# Patient Record
Sex: Female | Born: 1950 | Race: White | Hispanic: No | Smoking: Former smoker
Health system: Southern US, Community
[De-identification: ages and names within clinical notes are randomized; demographics above are authoritative.]

## PROBLEM LIST (undated history)

## (undated) DIAGNOSIS — I10 Essential (primary) hypertension: Secondary | ICD-10-CM

## (undated) DIAGNOSIS — Z9289 Personal history of other medical treatment: Secondary | ICD-10-CM

## (undated) DIAGNOSIS — E039 Hypothyroidism, unspecified: Secondary | ICD-10-CM

## (undated) DIAGNOSIS — M1990 Unspecified osteoarthritis, unspecified site: Secondary | ICD-10-CM

## (undated) DIAGNOSIS — R011 Cardiac murmur, unspecified: Secondary | ICD-10-CM

## (undated) DIAGNOSIS — I509 Heart failure, unspecified: Secondary | ICD-10-CM

## (undated) HISTORY — PX: CHOLECYSTECTOMY: SHX55

## (undated) HISTORY — PX: DILATION AND CURETTAGE OF UTERUS: SHX78

---

## 1999-11-30 ENCOUNTER — Encounter: Admission: RE | Admit: 1999-11-30 | Discharge: 1999-11-30 | Payer: Self-pay | Admitting: Obstetrics and Gynecology

## 1999-11-30 ENCOUNTER — Encounter: Payer: Self-pay | Admitting: Obstetrics and Gynecology

## 2000-11-30 ENCOUNTER — Encounter: Admission: RE | Admit: 2000-11-30 | Discharge: 2000-11-30 | Payer: Self-pay | Admitting: Obstetrics and Gynecology

## 2000-11-30 ENCOUNTER — Encounter: Payer: Self-pay | Admitting: Obstetrics and Gynecology

## 2001-12-05 ENCOUNTER — Encounter: Payer: Self-pay | Admitting: Obstetrics and Gynecology

## 2001-12-05 ENCOUNTER — Ambulatory Visit (HOSPITAL_COMMUNITY): Admission: RE | Admit: 2001-12-05 | Discharge: 2001-12-05 | Payer: Self-pay | Admitting: Obstetrics and Gynecology

## 2002-05-05 ENCOUNTER — Encounter: Payer: Self-pay | Admitting: Family Medicine

## 2002-05-05 ENCOUNTER — Ambulatory Visit (HOSPITAL_COMMUNITY): Admission: RE | Admit: 2002-05-05 | Discharge: 2002-05-05 | Payer: Self-pay | Admitting: Family Medicine

## 2002-05-27 ENCOUNTER — Encounter (INDEPENDENT_AMBULATORY_CARE_PROVIDER_SITE_OTHER): Payer: Self-pay | Admitting: *Deleted

## 2002-05-27 ENCOUNTER — Ambulatory Visit (HOSPITAL_COMMUNITY): Admission: RE | Admit: 2002-05-27 | Discharge: 2002-05-27 | Payer: Self-pay

## 2003-01-17 ENCOUNTER — Encounter: Payer: Self-pay | Admitting: Obstetrics and Gynecology

## 2003-01-17 ENCOUNTER — Ambulatory Visit (HOSPITAL_COMMUNITY): Admission: RE | Admit: 2003-01-17 | Discharge: 2003-01-17 | Payer: Self-pay | Admitting: Obstetrics and Gynecology

## 2006-03-02 ENCOUNTER — Emergency Department (HOSPITAL_COMMUNITY): Admission: EM | Admit: 2006-03-02 | Discharge: 2006-03-02 | Payer: Self-pay | Admitting: Emergency Medicine

## 2009-02-03 ENCOUNTER — Encounter: Admission: RE | Admit: 2009-02-03 | Discharge: 2009-05-04 | Payer: Self-pay | Admitting: Rheumatology

## 2009-02-07 ENCOUNTER — Emergency Department (HOSPITAL_COMMUNITY): Admission: EM | Admit: 2009-02-07 | Discharge: 2009-02-07 | Payer: Self-pay | Admitting: Family Medicine

## 2009-10-09 ENCOUNTER — Ambulatory Visit (HOSPITAL_COMMUNITY): Admission: RE | Admit: 2009-10-09 | Discharge: 2009-10-09 | Payer: Self-pay | Admitting: Obstetrics and Gynecology

## 2010-05-20 ENCOUNTER — Ambulatory Visit: Payer: Self-pay | Admitting: Oncology

## 2010-06-02 LAB — CBC WITH DIFFERENTIAL/PLATELET
BASO%: 1.2 % (ref 0.0–2.0)
Basophils Absolute: 0.1 10*3/uL (ref 0.0–0.1)
EOS%: 3.8 % (ref 0.0–7.0)
Eosinophils Absolute: 0.4 10*3/uL (ref 0.0–0.5)
HCT: 40.1 % (ref 34.8–46.6)
HGB: 13.9 g/dL (ref 11.6–15.9)
LYMPH%: 21.7 % (ref 14.0–49.7)
MCH: 29.4 pg (ref 25.1–34.0)
MCHC: 34.5 g/dL (ref 31.5–36.0)
MCV: 85.2 fL (ref 79.5–101.0)
MONO#: 0.7 10*3/uL (ref 0.1–0.9)
MONO%: 6.2 % (ref 0.0–14.0)
NEUT#: 7.4 10*3/uL — ABNORMAL HIGH (ref 1.5–6.5)
NEUT%: 67.1 % (ref 38.4–76.8)
Platelets: 268 10*3/uL (ref 145–400)
RBC: 4.71 10*6/uL (ref 3.70–5.45)
RDW: 14.7 % — ABNORMAL HIGH (ref 11.2–14.5)
WBC: 11.1 10*3/uL — ABNORMAL HIGH (ref 3.9–10.3)
lymph#: 2.4 10*3/uL (ref 0.9–3.3)

## 2010-06-02 LAB — COMPREHENSIVE METABOLIC PANEL
ALT: 19 U/L (ref 0–35)
AST: 24 U/L (ref 0–37)
Albumin: 3.6 g/dL (ref 3.5–5.2)
Alkaline Phosphatase: 94 U/L (ref 39–117)
BUN: 14 mg/dL (ref 6–23)
CO2: 31 mEq/L (ref 19–32)
Calcium: 9.3 mg/dL (ref 8.4–10.5)
Chloride: 100 mEq/L (ref 96–112)
Creatinine, Ser: 0.97 mg/dL (ref 0.40–1.20)
Glucose, Bld: 94 mg/dL (ref 70–99)
Potassium: 3.7 mEq/L (ref 3.5–5.3)
Sodium: 140 mEq/L (ref 135–145)
Total Bilirubin: 0.8 mg/dL (ref 0.3–1.2)
Total Protein: 7.9 g/dL (ref 6.0–8.3)

## 2010-06-02 LAB — PROTIME-INR
INR: 1.1 — ABNORMAL LOW (ref 2.00–3.50)
Protime: 13.2 Seconds (ref 10.6–13.4)

## 2010-06-08 LAB — APTT: aPTT: 40 seconds — ABNORMAL HIGH (ref 24–37)

## 2010-06-08 LAB — VON WILLEBRAND FACTOR MULTIMER
Factor-VIII Activity: 197 % — ABNORMAL HIGH (ref 50–180)
Von Willebrand Factor Ag: 304 % — ABNORMAL HIGH (ref 50–217)

## 2010-06-10 LAB — MIXING STUDY DILUTIONS, PTT
Patient 1/1 Immediate Mix: 36 seconds
Patient 4/1 Immediate Mix: 44 seconds

## 2010-06-10 LAB — BETA-2 GLYCOPROTEIN ANTIBODIES: Beta-2 Glyco I IgG: 0 G Units (ref <20)

## 2010-06-10 LAB — LUPUS ANTICOAGULANT PANEL
DRVVT: 44.4 secs — ABNORMAL HIGH (ref 36.2–44.3)
Lupus Anticoagulant: NOT DETECTED

## 2010-06-10 LAB — PTT FACTOR INHIBITOR (MIXING STUDY): PTT: 52 seconds — ABNORMAL HIGH (ref 24–37)

## 2010-06-14 LAB — FACTOR 12 ASSAY: Factor XII Activity: 91 % (ref 50–150)

## 2010-06-21 ENCOUNTER — Ambulatory Visit: Payer: Self-pay | Admitting: Oncology

## 2010-11-11 LAB — CBC
Hemoglobin: 13.7 g/dL (ref 12.0–15.0)
MCV: 85.6 fL (ref 78.0–100.0)
RBC: 4.83 MIL/uL (ref 3.87–5.11)
WBC: 9.9 10*3/uL (ref 4.0–10.5)

## 2010-11-11 LAB — BASIC METABOLIC PANEL
BUN: 16 mg/dL (ref 6–23)
Chloride: 102 mEq/L (ref 96–112)
Creatinine, Ser: 1 mg/dL (ref 0.4–1.2)

## 2011-01-07 NOTE — Op Note (Signed)
NAME:  [name], [name]                      ACCOUNT NO.:  [id number]   MEDICAL RECORD NO.:  [id number]                   PATIENT TYPE:   LOCATION:                                       FACILITY:  [facility]   PHYSICIAN:  Skeet Simmer., M.D.         DATE OF BIRTH:  Dec 03, 1950   DATE OF PROCEDURE:  05/27/2002  DATE OF DISCHARGE:                                 OPERATIVE REPORT   PREOPERATIVE DIAGNOSES:  Symptomatic gallstones.   POSTOPERATIVE DIAGNOSES:  Symptomatic gallstones.   OPERATION PERFORMED:  Laparoscopic cholecystectomy.   SURGEON:  Zigmund Daniel, M.D.   ANESTHESIA:  General.   ASSISTANT:  Lorne Skeens. Hoxworth, M.D.   DESCRIPTION OF PROCEDURE:  Following adequate general anesthesia and  monitoring and routine preparation and draping of the abdomen I liberally  infused local anesthetic just below the umbilicus and then made a transverse  incision and dissected down to the fascia.  After cutting the fascia in the  midline and entering the peritoneum bluntly, I placed a 0 Vicryl pursestring  suture in the fascia, secured with a Hasson cannula and inflated the abdomen  with CO2.  I put in the laparoscope and saw no abnormalities except for  slightly distended gallbladder.  I then anesthetized three additional port  sites, one in the upper midline and two in the right upper quadrant and  placed three additional ports, then positioned the patient head up, foot  down and tilted to the left.  I grasped the gallbladder at the fundus and  elevated that toward the right shoulder and pulled the infundibulum  laterally and had good view of the structures of the hepatoduodenal  ligament.  I incised the peritoneum and dissected out the cystic duct and  the cystic artery, noting the cystic duct emergent from the infundibulum of  the gallbladder and noting the common bile duct separately.  I put four  clips across the cystic duct and cut between the two closest to the  gallbladder.  I put three clips on the cystic artery and cut between the two  closest to the gallbladder.  I then used a spatula and hook cautery  instruments to dissect the gallbladder from the gallbladder fossa and get  hemostasis in the gallbladder bed.  Hemostasis was not a problem.  I made  one small hole in the gallbladder while dissecting, so after detaching the  gallbladder from the liver, I placed it in a plastic pouch and then removed  it through the umbilical incision and tied the pursestring suture.  I  copiously irrigated the right upper quadrant to dilute out the bile which  had been spilled and then removed that irrigant.  I found that hemostasis  was excellent.  Sponge, needle and instrument counts were correct.  I  withdrew the lateral ports under direct vision, then allowed the CO2 to  escape and removed the epigastric port.  I closed all skin incisions  with  intracuticular 4-0 Vicryl and Steri-Strips.  The patient was stable  throughout the procedure.                                                 Skeet Simmer., M.D.    Elvis Coil  D:  05/27/2002  T:  05/27/2002  Job:  [id number]

## 2014-03-13 DIAGNOSIS — I509 Heart failure, unspecified: Secondary | ICD-10-CM | POA: Diagnosis not present

## 2014-03-13 DIAGNOSIS — K219 Gastro-esophageal reflux disease without esophagitis: Secondary | ICD-10-CM | POA: Diagnosis not present

## 2014-03-13 DIAGNOSIS — E039 Hypothyroidism, unspecified: Secondary | ICD-10-CM | POA: Diagnosis not present

## 2014-03-13 DIAGNOSIS — I10 Essential (primary) hypertension: Secondary | ICD-10-CM | POA: Diagnosis not present

## 2014-03-20 DIAGNOSIS — I10 Essential (primary) hypertension: Secondary | ICD-10-CM | POA: Diagnosis not present

## 2014-03-20 DIAGNOSIS — E559 Vitamin D deficiency, unspecified: Secondary | ICD-10-CM | POA: Diagnosis not present

## 2014-03-20 DIAGNOSIS — E039 Hypothyroidism, unspecified: Secondary | ICD-10-CM | POA: Diagnosis not present

## 2014-07-30 DIAGNOSIS — I5032 Chronic diastolic (congestive) heart failure: Secondary | ICD-10-CM | POA: Diagnosis not present

## 2014-07-30 DIAGNOSIS — Z23 Encounter for immunization: Secondary | ICD-10-CM | POA: Diagnosis not present

## 2014-09-09 DIAGNOSIS — E559 Vitamin D deficiency, unspecified: Secondary | ICD-10-CM | POA: Diagnosis not present

## 2014-09-09 DIAGNOSIS — I10 Essential (primary) hypertension: Secondary | ICD-10-CM | POA: Diagnosis not present

## 2014-09-24 DIAGNOSIS — I10 Essential (primary) hypertension: Secondary | ICD-10-CM | POA: Diagnosis not present

## 2014-09-24 DIAGNOSIS — K219 Gastro-esophageal reflux disease without esophagitis: Secondary | ICD-10-CM | POA: Diagnosis not present

## 2014-09-24 DIAGNOSIS — E039 Hypothyroidism, unspecified: Secondary | ICD-10-CM | POA: Diagnosis not present

## 2014-09-24 DIAGNOSIS — I5032 Chronic diastolic (congestive) heart failure: Secondary | ICD-10-CM | POA: Diagnosis not present

## 2015-03-08 ENCOUNTER — Emergency Department
Admission: EM | Admit: 2015-03-08 | Discharge: 2015-03-08 | Disposition: A | Discharge status: Home / Self Care | Payer: Medicare Other | Attending: Emergency Medicine | Admitting: Emergency Medicine

## 2015-03-08 ENCOUNTER — Encounter: Payer: Self-pay | Admitting: Emergency Medicine

## 2015-03-08 DIAGNOSIS — Z87891 Personal history of nicotine dependence: Secondary | ICD-10-CM | POA: Diagnosis not present

## 2015-03-08 DIAGNOSIS — S80862A Insect bite (nonvenomous), left lower leg, initial encounter: Secondary | ICD-10-CM | POA: Insufficient documentation

## 2015-03-08 DIAGNOSIS — Y9389 Activity, other specified: Secondary | ICD-10-CM | POA: Diagnosis not present

## 2015-03-08 DIAGNOSIS — S81852A Open bite, left lower leg, initial encounter: Secondary | ICD-10-CM | POA: Diagnosis not present

## 2015-03-08 DIAGNOSIS — W57XXXA Bitten or stung by nonvenomous insect and other nonvenomous arthropods, initial encounter: Secondary | ICD-10-CM | POA: Diagnosis not present

## 2015-03-08 DIAGNOSIS — Y998 Other external cause status: Secondary | ICD-10-CM | POA: Insufficient documentation

## 2015-03-08 DIAGNOSIS — Y92009 Unspecified place in unspecified non-institutional (private) residence as the place of occurrence of the external cause: Secondary | ICD-10-CM | POA: Diagnosis not present

## 2015-03-08 DIAGNOSIS — S8992XA Unspecified injury of left lower leg, initial encounter: Secondary | ICD-10-CM | POA: Diagnosis present

## 2015-03-08 DIAGNOSIS — T63301A Toxic effect of unspecified spider venom, accidental (unintentional), initial encounter: Secondary | ICD-10-CM

## 2015-03-08 DIAGNOSIS — I10 Essential (primary) hypertension: Secondary | ICD-10-CM | POA: Insufficient documentation

## 2015-03-08 HISTORY — DX: Heart failure, unspecified: I50.9

## 2015-03-08 HISTORY — DX: Unspecified osteoarthritis, unspecified site: M19.90

## 2015-03-08 HISTORY — DX: Essential (primary) hypertension: I10

## 2015-03-08 MED ORDER — RANITIDINE HCL 150 MG PO TABS: 150.0000 mg | ORAL_TABLET | Freq: Two times a day (BID) | ORAL | Status: DC

## 2015-03-08 MED ORDER — HYDROXYZINE PAMOATE 25 MG PO CAPS: 25.0000 mg | ORAL_CAPSULE | Freq: Three times a day (TID) | ORAL | Status: DC | PRN

## 2015-03-08 MED ORDER — IBUPROFEN 800 MG PO TABS: 800.0000 mg | ORAL_TABLET | Freq: Three times a day (TID) | ORAL | Status: DC | PRN

## 2015-03-08 MED ORDER — SULFAMETHOXAZOLE-TRIMETHOPRIM 800-160 MG PO TABS: 1.0000 | ORAL_TABLET | Freq: Two times a day (BID) | ORAL | Status: DC

## 2015-03-08 MED ORDER — HYDROCODONE-ACETAMINOPHEN 5-325 MG PO TABS: 1.0000 | ORAL_TABLET | ORAL | Status: DC | PRN

## 2015-03-08 MED ORDER — OXYCODONE-ACETAMINOPHEN 5-325 MG PO TABS: 1.0000 | ORAL_TABLET | ORAL | Status: DC | PRN

## 2015-03-08 NOTE — ED Notes (Signed)
Pt presents to the ER from home with complaints of left lower extremity pain since 02:30 this morning. Pt reports she think she has a spider bite to left lower extremity. Redness noted to area, warm at touch, and tender. redness is located around left ankle.

## 2015-03-08 NOTE — Discharge Instructions (Signed)
Spider Bite Spider bites are not common. Most spider bites do not cause serious problems. The elderly, very young children, and people with certain existing medical conditions are more likely to experience significant symptoms. SYMPTOMS  Spider bites may not cause any pain at first. Within 1 or 2 days of the bite, there may be swelling, redness, and pain in the bite area. However, some spider bites can cause pain within the first hour. TREATMENT  Your caregiver may prescribe antibiotic medicine if a bacterial infection develops in the bite. However, not all spider bites require antibiotics or prescription medicines.  HOME CARE INSTRUCTIONS  Do not scratch the bite area.  Keep the bite area clean and dry. Wash the area with soap and water as directed.  Put ice or cool compresses on the bite area.  Put ice in a plastic bag.  Place a towel between your skin and the bag.  Leave the ice on for 20 minutes, 4 times a day for the first 2 to 3 days, or as directed.  Keep the bite area elevated above the level of your heart. This helps reduce redness and swelling.  Only take over-the-counter or prescription medicines as directed by your caregiver.  If you are given antibiotics, take them as directed. Finish them even if you start to feel better. You may need a tetanus shot if:  You cannot remember when you had your last tetanus shot.  You have never had a tetanus shot.  The injury broke your skin. If you get a tetanus shot, your arm may swell, get red, and feel warm to the touch. This is common and not a problem. If you need a tetanus shot and you choose not to have one, there is a rare chance of getting tetanus. Sickness from tetanus can be serious. SEEK MEDICAL CARE IF: Your bite is not better after 3 days of treatment. SEEK IMMEDIATE MEDICAL CARE IF:  Your bite turns purple or develops increased swelling, pain, or redness.  You develop shortness of breath or chest pain.  You have  muscle cramps or painful muscle spasms.  You develop abdominal pain, nausea, or vomiting.  You feel unusually tired or sleepy. MAKE SURE YOU:  Understand these instructions.  Will watch your condition.  Will get help right away if you are not doing well or get worse. Document Released: 09/15/2004 Document Revised: 10/31/2011 Document Reviewed: 03/09/2011 Exit[employer] Patient Information 2015 [employer]. This information is not intended to replace advice given to you by your health care provider. Make sure you discuss any questions you have with your health care provider.  

## 2015-03-08 NOTE — ED Notes (Signed)
NAD noted at time of D/C. Pt taken to the lobby via wheelchair by her SO.

## 2015-03-08 NOTE — ED Provider Notes (Signed)
[facility] Emergency Department Provider Note  ____________________________________________  Time seen: Approximately 10:33 AM  I have reviewed the triage vital signs and the nursing notes.   HISTORY  Chief Complaint Leg Pain   HPI [name] is a 64 y.o. female since emergency room with complaints of left lower extremity pain since about 2:30 this morning. Patient states that she's got a spider bite to the left leg. He is complaining of increased redness and tenderness with warm to touch.   Past Medical History  Diagnosis Date  . CHF (congestive heart failure)   . Arthritis   . Hypertension     There are no active problems to display for this patient.   Past Surgical History  Procedure Laterality Date  . Cholecystectomy      Current Outpatient Rx  Name  Route  Sig  Dispense  Refill  . ibuprofen (ADVIL,MOTRIN) 800 MG tablet   Oral   Take 1 tablet (800 mg total) by mouth every 8 (eight) hours as needed.   30 tablet   0   . oxyCODONE-acetaminophen (ROXICET) 5-325 MG per tablet   Oral   Take 1-2 tablets by mouth every 4 (four) hours as needed for severe pain.   15 tablet   0   . sulfamethoxazole-trimethoprim (BACTRIM DS,SEPTRA DS) 800-160 MG per tablet   Oral   Take 1 tablet by mouth 2 (two) times daily.   20 tablet   0     Allergies Review of patient's allergies indicates not on file.  No family history on file.  Social History History  Substance Use Topics  . Smoking status: Former Games developer  . Smokeless tobacco: Not on file  . Alcohol Use: No    Review of Systems Constitutional: No fever/chills Eyes: No visual changes. ENT: No sore throat. Cardiovascular: Denies chest pain. Respiratory: Denies shortness of breath. Gastrointestinal: No abdominal pain.  No nausea, no vomiting.  No diarrhea.  No constipation. Genitourinary: Negative for dysuria. Musculoskeletal: Negative for back pain. Skin: Negative for rash.  Positive for redness and point tenderness left lower leg. Neurological: Negative for headaches, focal weakness or numbness.  10-point ROS otherwise negative.  ____________________________________________   PHYSICAL EXAM:  VITAL SIGNS: ED Triage Vitals  Enc Vitals Group     BP 03/08/15 1032 147/74 mmHg     Pulse Rate 03/08/15 1032 78     Resp 03/08/15 1032 20     Temp 03/08/15 1032 100 F (37.8 C)     Temp Source 03/08/15 1032 Oral     SpO2 03/08/15 1032 98 %     Weight 03/08/15 1032 264 lb (119.75 kg)     Height 03/08/15 1032  (1.702 m)     Head Cir --      Peak Flow --      Pain Score 03/08/15 1033 7     Pain Loc --      Pain Edu? --      Excl. in GC? --     Constitutional: Alert and oriented. Well appearing and in no acute distress. Eyes: Conjunctivae are normal. PERRL. EOMI. Head: Atraumatic. Nose: No congestion/rhinnorhea. Mouth/Throat: Mucous membranes are moist.  Oropharynx non-erythematous. Neck: No stridor.   Cardiovascular: Normal rate, regular rhythm. Grossly normal heart sounds.  Good peripheral circulation. Respiratory: Normal respiratory effort.  No retractions. Lungs CTAB. Musculoskeletal: No lower extremity tenderness nor edema.  No joint effusions. Neurologic:  Normal speech and language. No gross focal neurologic deficits are  appreciated. No gait instability. Skin:  Skin is warm, dry and intact. Positive edema with obvious punctate wound. Warm to palpation. Increased erythema compared to the right side consistent with cellulitis. Psychiatric: Mood and affect are normal. Speech and behavior are normal.  ____________________________________________   LABS (all labs ordered are listed, but only abnormal results are displayed)  Labs Reviewed - No data to display ____________________________________________    RADIOLOGY  Not applicable ____________________________________________   PROCEDURES  Procedure(s) performed: None  Critical Care  performed: No  ____________________________________________   INITIAL IMPRESSION / ASSESSMENT AND PLAN / ED COURSE  Pertinent labs & imaging results that were available during my care of the patient were reviewed by me and considered in my medical decision making (see chart for details). Status post insect bite. Questionable spider. Given for Bactrim DS twice a day #20, Percocet 5/325, and ibuprofen 800. Patient voices no other emergency medical complaints at this time and will return to the ER with any worsening symptomology. ____________________________________________   FINAL CLINICAL IMPRESSION(S) / ED DIAGNOSES  Final diagnoses:  Spider bite, accidental or unintentional, initial encounter      Evangeline DakinCharles M Uel Davidow, PA-C 03/08/15 1105  Darci Currentandolph N Brown, MD 03/10/15 [id number]

## 2015-03-25 DIAGNOSIS — Z0000 Encounter for general adult medical examination without abnormal findings: Secondary | ICD-10-CM | POA: Diagnosis not present

## 2015-03-25 DIAGNOSIS — I129 Hypertensive chronic kidney disease with stage 1 through stage 4 chronic kidney disease, or unspecified chronic kidney disease: Secondary | ICD-10-CM | POA: Diagnosis not present

## 2015-03-25 DIAGNOSIS — E039 Hypothyroidism, unspecified: Secondary | ICD-10-CM | POA: Diagnosis not present

## 2015-03-25 DIAGNOSIS — I10 Essential (primary) hypertension: Secondary | ICD-10-CM | POA: Diagnosis not present

## 2015-03-25 DIAGNOSIS — E559 Vitamin D deficiency, unspecified: Secondary | ICD-10-CM | POA: Diagnosis not present

## 2015-03-25 DIAGNOSIS — F17211 Nicotine dependence, cigarettes, in remission: Secondary | ICD-10-CM | POA: Diagnosis not present

## 2015-04-01 DIAGNOSIS — F17211 Nicotine dependence, cigarettes, in remission: Secondary | ICD-10-CM | POA: Diagnosis not present

## 2015-04-01 DIAGNOSIS — I5032 Chronic diastolic (congestive) heart failure: Secondary | ICD-10-CM | POA: Diagnosis not present

## 2015-04-01 DIAGNOSIS — F3340 Major depressive disorder, recurrent, in remission, unspecified: Secondary | ICD-10-CM | POA: Diagnosis not present

## 2015-04-01 DIAGNOSIS — M810 Age-related osteoporosis without current pathological fracture: Secondary | ICD-10-CM | POA: Diagnosis not present

## 2015-04-01 DIAGNOSIS — I129 Hypertensive chronic kidney disease with stage 1 through stage 4 chronic kidney disease, or unspecified chronic kidney disease: Secondary | ICD-10-CM | POA: Diagnosis not present

## 2015-04-05 DIAGNOSIS — Z1211 Encounter for screening for malignant neoplasm of colon: Secondary | ICD-10-CM | POA: Diagnosis not present

## 2015-04-05 DIAGNOSIS — Z1212 Encounter for screening for malignant neoplasm of rectum: Secondary | ICD-10-CM | POA: Diagnosis not present

## 2015-04-22 DIAGNOSIS — M170 Bilateral primary osteoarthritis of knee: Secondary | ICD-10-CM | POA: Diagnosis not present

## 2015-06-19 DIAGNOSIS — M170 Bilateral primary osteoarthritis of knee: Secondary | ICD-10-CM | POA: Diagnosis not present

## 2015-08-04 DIAGNOSIS — M170 Bilateral primary osteoarthritis of knee: Secondary | ICD-10-CM | POA: Diagnosis not present

## 2015-09-24 DIAGNOSIS — F17211 Nicotine dependence, cigarettes, in remission: Secondary | ICD-10-CM | POA: Diagnosis not present

## 2015-09-24 DIAGNOSIS — E559 Vitamin D deficiency, unspecified: Secondary | ICD-10-CM | POA: Diagnosis not present

## 2015-09-24 DIAGNOSIS — I5032 Chronic diastolic (congestive) heart failure: Secondary | ICD-10-CM | POA: Diagnosis not present

## 2015-09-24 DIAGNOSIS — M8580 Other specified disorders of bone density and structure, unspecified site: Secondary | ICD-10-CM | POA: Diagnosis not present

## 2015-09-24 DIAGNOSIS — E039 Hypothyroidism, unspecified: Secondary | ICD-10-CM | POA: Diagnosis not present

## 2015-10-01 DIAGNOSIS — F3340 Major depressive disorder, recurrent, in remission, unspecified: Secondary | ICD-10-CM | POA: Diagnosis not present

## 2015-10-01 DIAGNOSIS — I129 Hypertensive chronic kidney disease with stage 1 through stage 4 chronic kidney disease, or unspecified chronic kidney disease: Secondary | ICD-10-CM | POA: Diagnosis not present

## 2015-10-01 DIAGNOSIS — N182 Chronic kidney disease, stage 2 (mild): Secondary | ICD-10-CM | POA: Diagnosis not present

## 2015-10-01 DIAGNOSIS — I509 Heart failure, unspecified: Secondary | ICD-10-CM | POA: Diagnosis not present

## 2015-10-05 ENCOUNTER — Other Ambulatory Visit (HOSPITAL_COMMUNITY): Payer: Self-pay | Admitting: Internal Medicine

## 2015-10-05 DIAGNOSIS — R011 Cardiac murmur, unspecified: Secondary | ICD-10-CM

## 2015-10-12 ENCOUNTER — Inpatient Hospital Stay (HOSPITAL_COMMUNITY)
Admission: EM | Admit: 2015-10-12 | Discharge: 2015-10-14 | DRG: 355 | Disposition: A | Discharge status: Home / Self Care | Payer: Medicare Other | Attending: General Surgery | Admitting: General Surgery

## 2015-10-12 ENCOUNTER — Emergency Department (HOSPITAL_COMMUNITY): Payer: Medicare Other

## 2015-10-12 ENCOUNTER — Encounter (HOSPITAL_COMMUNITY): Payer: Self-pay | Admitting: Emergency Medicine

## 2015-10-12 ENCOUNTER — Emergency Department (HOSPITAL_COMMUNITY): Payer: Medicare Other | Admitting: Certified Registered Nurse Anesthetist

## 2015-10-12 ENCOUNTER — Encounter (HOSPITAL_COMMUNITY): Admission: EM | Disposition: A | Discharge status: Home / Self Care | Payer: Self-pay | Source: Home / Self Care

## 2015-10-12 DIAGNOSIS — M1990 Unspecified osteoarthritis, unspecified site: Secondary | ICD-10-CM | POA: Diagnosis present

## 2015-10-12 DIAGNOSIS — Z87891 Personal history of nicotine dependence: Secondary | ICD-10-CM | POA: Diagnosis not present

## 2015-10-12 DIAGNOSIS — I10 Essential (primary) hypertension: Secondary | ICD-10-CM | POA: Diagnosis not present

## 2015-10-12 DIAGNOSIS — K420 Umbilical hernia with obstruction, without gangrene: Secondary | ICD-10-CM | POA: Diagnosis present

## 2015-10-12 DIAGNOSIS — K219 Gastro-esophageal reflux disease without esophagitis: Secondary | ICD-10-CM | POA: Diagnosis present

## 2015-10-12 DIAGNOSIS — E039 Hypothyroidism, unspecified: Secondary | ICD-10-CM | POA: Diagnosis present

## 2015-10-12 DIAGNOSIS — I509 Heart failure, unspecified: Secondary | ICD-10-CM | POA: Diagnosis present

## 2015-10-12 DIAGNOSIS — E669 Obesity, unspecified: Secondary | ICD-10-CM | POA: Diagnosis present

## 2015-10-12 DIAGNOSIS — K429 Umbilical hernia without obstruction or gangrene: Secondary | ICD-10-CM | POA: Diagnosis not present

## 2015-10-12 DIAGNOSIS — R1013 Epigastric pain: Secondary | ICD-10-CM | POA: Diagnosis not present

## 2015-10-12 DIAGNOSIS — R1084 Generalized abdominal pain: Secondary | ICD-10-CM | POA: Diagnosis not present

## 2015-10-12 DIAGNOSIS — Z6839 Body mass index (BMI) 39.0-39.9, adult: Secondary | ICD-10-CM | POA: Diagnosis not present

## 2015-10-12 HISTORY — DX: Hypothyroidism, unspecified: E03.9

## 2015-10-12 HISTORY — DX: Essential (primary) hypertension: I10

## 2015-10-12 HISTORY — PX: UMBILICAL HERNIA REPAIR: SHX196

## 2015-10-12 HISTORY — PX: OMENTECTOMY: SHX5985

## 2015-10-12 LAB — COMPREHENSIVE METABOLIC PANEL
ALBUMIN: 3.5 g/dL (ref 3.5–5.0)
ALT: 26 U/L (ref 14–54)
ANION GAP: 14 (ref 5–15)
AST: 33 U/L (ref 15–41)
Alkaline Phosphatase: 83 U/L (ref 38–126)
BUN: 18 mg/dL (ref 6–20)
CALCIUM: 9.5 mg/dL (ref 8.9–10.3)
CHLORIDE: 106 mmol/L (ref 101–111)
CO2: 23 mmol/L (ref 22–32)
CREATININE: 0.78 mg/dL (ref 0.44–1.00)
GFR calc Af Amer: >60 mL/min (ref >60)
GFR calc non Af Amer: >60 mL/min (ref >60)
Glucose, Bld: 92 mg/dL (ref 65–99)
POTASSIUM: 4.2 mmol/L (ref 3.5–5.1)
SODIUM: 143 mmol/L (ref 135–145)
TOTAL PROTEIN: 6.8 g/dL (ref 6.5–8.1)
Total Bilirubin: 0.8 mg/dL (ref 0.3–1.2)

## 2015-10-12 LAB — CBC WITH DIFFERENTIAL/PLATELET
BASOS ABS: 0 10*3/uL (ref 0.0–0.1)
BASOS PCT: 1 %
EOS ABS: 0.1 10*3/uL (ref 0.0–0.7)
Eosinophils Relative: 1 %
HCT: 44.1 % (ref 36.0–46.0)
HEMOGLOBIN: 14.8 g/dL (ref 12.0–15.0)
Lymphocytes Relative: 17 %
Lymphs Abs: 1.2 10*3/uL (ref 0.7–4.0)
MCH: 31 pg (ref 26.0–34.0)
MCHC: 33.6 g/dL (ref 30.0–36.0)
MCV: 92.5 fL (ref 78.0–100.0)
MONO ABS: 0.3 10*3/uL (ref 0.1–1.0)
MONOS PCT: 4 %
NEUTROS ABS: 5.5 10*3/uL (ref 1.7–7.7)
NEUTROS PCT: 77 %
Platelets: 148 10*3/uL — ABNORMAL LOW (ref 150–400)
RBC: 4.77 MIL/uL (ref 3.87–5.11)
RDW: 13.7 % (ref 11.5–15.5)
WBC: 7.1 10*3/uL (ref 4.0–10.5)

## 2015-10-12 LAB — LIPASE, BLOOD: LIPASE: 30 U/L (ref 11–51)

## 2015-10-12 SURGERY — REPAIR, HERNIA, UMBILICAL, ADULT
Anesthesia: General | Site: Abdomen

## 2015-10-12 MED ORDER — METHOCARBAMOL 500 MG PO TABS
500.0000 mg | ORAL_TABLET | Freq: Four times a day (QID) | ORAL | Status: DC | PRN
Start: 2015-10-12 — End: 2015-10-14
  Administered 2015-10-14: 500 mg via ORAL
  Filled 2015-10-12: qty 1

## 2015-10-12 MED ORDER — MIDAZOLAM HCL 2 MG/2ML IJ SOLN
INTRAMUSCULAR | Status: AC
  Filled 2015-10-12: qty 2

## 2015-10-12 MED ORDER — ENOXAPARIN SODIUM 40 MG/0.4ML ~~LOC~~ SOLN
40.0000 mg | SUBCUTANEOUS | Status: DC
  Administered 2015-10-13 – 2015-10-14 (×2): 40 mg via SUBCUTANEOUS
  Filled 2015-10-12 (×2): qty 0.4

## 2015-10-12 MED ORDER — METOPROLOL TARTRATE 100 MG PO TABS
100.0000 mg | ORAL_TABLET | Freq: Two times a day (BID) | ORAL | Status: DC
  Administered 2015-10-12 – 2015-10-14 (×4): 100 mg via ORAL
  Filled 2015-10-12 (×4): qty 1

## 2015-10-12 MED ORDER — SODIUM CHLORIDE 0.9 % IV SOLN
INTRAVENOUS | Status: DC
  Administered 2015-10-12: 11:00:00 via INTRAVENOUS

## 2015-10-12 MED ORDER — ACETAMINOPHEN 325 MG PO TABS: 650.0000 mg | ORAL_TABLET | Freq: Four times a day (QID) | ORAL | Status: DC | PRN

## 2015-10-12 MED ORDER — KCL IN DEXTROSE-NACL 20-5-0.45 MEQ/L-%-% IV SOLN
INTRAVENOUS | Status: DC
  Administered 2015-10-12 – 2015-10-13 (×2): via INTRAVENOUS
  Filled 2015-10-12 (×2): qty 1000

## 2015-10-12 MED ORDER — CEFAZOLIN SODIUM-DEXTROSE 2-3 GM-% IV SOLR
2.0000 g | Freq: Once | INTRAVENOUS | Status: DC
  Filled 2015-10-12: qty 50

## 2015-10-12 MED ORDER — SUGAMMADEX SODIUM 200 MG/2ML IV SOLN
INTRAVENOUS | Status: AC
  Filled 2015-10-12: qty 2

## 2015-10-12 MED ORDER — PROPOFOL 10 MG/ML IV BOLUS
INTRAVENOUS | Status: AC
  Filled 2015-10-12: qty 20

## 2015-10-12 MED ORDER — DEXAMETHASONE SODIUM PHOSPHATE 4 MG/ML IJ SOLN
INTRAMUSCULAR | Status: DC | PRN
  Administered 2015-10-12: 4 mg via INTRAVENOUS

## 2015-10-12 MED ORDER — PANTOPRAZOLE SODIUM 40 MG IV SOLR
40.0000 mg | Freq: Every day | INTRAVENOUS | Status: DC
  Administered 2015-10-12 – 2015-10-13 (×2): 40 mg via INTRAVENOUS
  Filled 2015-10-12 (×2): qty 40

## 2015-10-12 MED ORDER — MIDAZOLAM HCL 5 MG/5ML IJ SOLN
INTRAMUSCULAR | Status: DC | PRN
  Administered 2015-10-12 (×2): 1 mg via INTRAVENOUS

## 2015-10-12 MED ORDER — ONDANSETRON HCL 4 MG/2ML IJ SOLN: 4.0000 mg | Freq: Once | INTRAMUSCULAR | Status: DC | PRN

## 2015-10-12 MED ORDER — BUPIVACAINE-EPINEPHRINE 0.5% -1:200000 IJ SOLN
INTRAMUSCULAR | Status: DC | PRN
  Administered 2015-10-12: 20 mL

## 2015-10-12 MED ORDER — ROCURONIUM BROMIDE 100 MG/10ML IV SOLN
INTRAVENOUS | Status: DC | PRN
  Administered 2015-10-12: 20 mg via INTRAVENOUS
  Administered 2015-10-12: 30 mg via INTRAVENOUS

## 2015-10-12 MED ORDER — DIPHENHYDRAMINE HCL 12.5 MG/5ML PO ELIX: 12.5000 mg | ORAL_SOLUTION | Freq: Four times a day (QID) | ORAL | Status: DC | PRN

## 2015-10-12 MED ORDER — HYDROMORPHONE HCL 1 MG/ML IJ SOLN: 0.5000 mg | INTRAMUSCULAR | Status: DC | PRN

## 2015-10-12 MED ORDER — DIPHENHYDRAMINE HCL 50 MG/ML IJ SOLN: 12.5000 mg | Freq: Four times a day (QID) | INTRAMUSCULAR | Status: DC | PRN

## 2015-10-12 MED ORDER — PROPOFOL 10 MG/ML IV BOLUS
INTRAVENOUS | Status: DC | PRN
  Administered 2015-10-12: 180 mg via INTRAVENOUS

## 2015-10-12 MED ORDER — LIDOCAINE HCL (CARDIAC) 20 MG/ML IV SOLN
INTRAVENOUS | Status: AC
  Filled 2015-10-12: qty 5

## 2015-10-12 MED ORDER — OXYCODONE HCL 5 MG PO TABS: 5.0000 mg | ORAL_TABLET | Freq: Once | ORAL | Status: DC | PRN

## 2015-10-12 MED ORDER — DEXAMETHASONE SODIUM PHOSPHATE 4 MG/ML IJ SOLN
INTRAMUSCULAR | Status: AC
  Filled 2015-10-12: qty 1

## 2015-10-12 MED ORDER — LACTATED RINGERS IV SOLN
INTRAVENOUS | Status: DC
  Administered 2015-10-12: 13:00:00 via INTRAVENOUS

## 2015-10-12 MED ORDER — BUPIVACAINE-EPINEPHRINE (PF) 0.5% -1:200000 IJ SOLN
INTRAMUSCULAR | Status: AC
  Filled 2015-10-12: qty 30

## 2015-10-12 MED ORDER — LEVOTHYROXINE SODIUM 75 MCG PO TABS
75.0000 ug | ORAL_TABLET | ORAL | Status: DC
  Administered 2015-10-13 – 2015-10-14 (×2): 75 ug via ORAL
  Filled 2015-10-12 (×2): qty 1

## 2015-10-12 MED ORDER — LIDOCAINE HCL (CARDIAC) 20 MG/ML IV SOLN
INTRAVENOUS | Status: DC | PRN
  Administered 2015-10-12: 60 mg via INTRAVENOUS
  Administered 2015-10-12: 40 mg via INTRAVENOUS

## 2015-10-12 MED ORDER — 0.9 % SODIUM CHLORIDE (POUR BTL) OPTIME
TOPICAL | Status: DC | PRN
  Administered 2015-10-12: 1000 mL

## 2015-10-12 MED ORDER — PHENYLEPHRINE 40 MCG/ML (10ML) SYRINGE FOR IV PUSH (FOR BLOOD PRESSURE SUPPORT)
PREFILLED_SYRINGE | INTRAVENOUS | Status: AC
  Filled 2015-10-12: qty 10

## 2015-10-12 MED ORDER — ONDANSETRON HCL 4 MG/2ML IJ SOLN
INTRAMUSCULAR | Status: AC
  Filled 2015-10-12: qty 2

## 2015-10-12 MED ORDER — FENTANYL CITRATE (PF) 100 MCG/2ML IJ SOLN
INTRAMUSCULAR | Status: AC
  Filled 2015-10-12: qty 2

## 2015-10-12 MED ORDER — CIPROFLOXACIN IN D5W 400 MG/200ML IV SOLN
INTRAVENOUS | Status: DC | PRN
  Administered 2015-10-12: 400 mg via INTRAVENOUS

## 2015-10-12 MED ORDER — OXYCODONE HCL 5 MG PO TABS
5.0000 mg | ORAL_TABLET | ORAL | Status: DC | PRN
  Administered 2015-10-12 – 2015-10-13 (×2): 10 mg via ORAL
  Filled 2015-10-12 (×2): qty 2

## 2015-10-12 MED ORDER — SUCCINYLCHOLINE CHLORIDE 20 MG/ML IJ SOLN
INTRAMUSCULAR | Status: DC | PRN
  Administered 2015-10-12: 100 mg via INTRAVENOUS

## 2015-10-12 MED ORDER — OXYCODONE HCL 5 MG/5ML PO SOLN: 5.0000 mg | Freq: Once | ORAL | Status: DC | PRN

## 2015-10-12 MED ORDER — ONDANSETRON HCL 4 MG/2ML IJ SOLN
INTRAMUSCULAR | Status: DC | PRN
  Administered 2015-10-12: 4 mg via INTRAVENOUS

## 2015-10-12 MED ORDER — FENTANYL CITRATE (PF) 250 MCG/5ML IJ SOLN
INTRAMUSCULAR | Status: AC
  Filled 2015-10-12: qty 5

## 2015-10-12 MED ORDER — FENTANYL CITRATE (PF) 100 MCG/2ML IJ SOLN
25.0000 ug | INTRAMUSCULAR | Status: DC | PRN
  Administered 2015-10-12 (×4): 25 ug via INTRAVENOUS

## 2015-10-12 MED ORDER — ACETAMINOPHEN 650 MG RE SUPP: 650.0000 mg | Freq: Four times a day (QID) | RECTAL | Status: DC | PRN

## 2015-10-12 MED ORDER — FUROSEMIDE 20 MG PO TABS
20.0000 mg | ORAL_TABLET | Freq: Every day | ORAL | Status: DC
  Administered 2015-10-13 – 2015-10-14 (×2): 20 mg via ORAL
  Filled 2015-10-12 (×2): qty 1

## 2015-10-12 MED ORDER — PROMETHAZINE HCL 25 MG/ML IJ SOLN: 12.5000 mg | INTRAMUSCULAR | Status: DC | PRN

## 2015-10-12 MED ORDER — FENTANYL CITRATE (PF) 100 MCG/2ML IJ SOLN
INTRAMUSCULAR | Status: DC | PRN
  Administered 2015-10-12: 100 ug via INTRAVENOUS
  Administered 2015-10-12: 150 ug via INTRAVENOUS

## 2015-10-12 SURGICAL SUPPLY — 35 items
BLADE SURG ROTATE 9660 (MISCELLANEOUS) IMPLANT
CANISTER SUCTION 2500CC (MISCELLANEOUS) ×2 IMPLANT
CHLORAPREP W/TINT 26ML (MISCELLANEOUS) ×2 IMPLANT
COVER SURGICAL LIGHT HANDLE (MISCELLANEOUS) ×2 IMPLANT
DRAPE LAPAROSCOPIC ABDOMINAL (DRAPES) ×2 IMPLANT
DRAPE LAPAROTOMY T 98X78 PEDS (DRAPES) IMPLANT
DRAPE UTILITY XL STRL (DRAPES) ×2 IMPLANT
ELECT REM PT RETURN 9FT ADLT (ELECTROSURGICAL) ×2
ELECTRODE REM PT RTRN 9FT ADLT (ELECTROSURGICAL) ×1 IMPLANT
GLOVE BIO SURGEON STRL SZ8 (GLOVE) ×2 IMPLANT
GLOVE BIOGEL PI IND STRL 8 (GLOVE) ×1 IMPLANT
GLOVE BIOGEL PI INDICATOR 8 (GLOVE) ×1
GOWN STRL REUS W/ TWL LRG LVL3 (GOWN DISPOSABLE) ×1 IMPLANT
GOWN STRL REUS W/ TWL XL LVL3 (GOWN DISPOSABLE) ×1 IMPLANT
GOWN STRL REUS W/TWL LRG LVL3 (GOWN DISPOSABLE) ×1
GOWN STRL REUS W/TWL XL LVL3 (GOWN DISPOSABLE) ×1
KIT BASIN OR (CUSTOM PROCEDURE TRAY) ×2 IMPLANT
KIT ROOM TURNOVER OR (KITS) ×2 IMPLANT
LIGASURE IMPACT 36 18CM CVD LR (INSTRUMENTS) ×2 IMPLANT
LIQUID BAND (GAUZE/BANDAGES/DRESSINGS) ×2 IMPLANT
MESH VENTRALEX ST 8CM LRG (Mesh General) ×2 IMPLANT
NEEDLE 22X1 1/2 (OR ONLY) (NEEDLE) ×2 IMPLANT
NS IRRIG 1000ML POUR BTL (IV SOLUTION) ×2 IMPLANT
PACK GENERAL/GYN (CUSTOM PROCEDURE TRAY) ×2 IMPLANT
PAD ARMBOARD 7.5X6 YLW CONV (MISCELLANEOUS) ×2 IMPLANT
SUT MNCRL AB 4-0 PS2 18 (SUTURE) ×2 IMPLANT
SUT NOVA NAB DX-16 0-1 5-0 T12 (SUTURE) ×4 IMPLANT
SUT PROLENE 0 CT 1 30 (SUTURE) ×4 IMPLANT
SUT VIC AB 2-0 CT1 27 (SUTURE) ×1
SUT VIC AB 2-0 CT1 TAPERPNT 27 (SUTURE) ×1 IMPLANT
SUT VIC AB 3-0 SH 27 (SUTURE) ×1
SUT VIC AB 3-0 SH 27XBRD (SUTURE) ×1 IMPLANT
SYR CONTROL 10ML LL (SYRINGE) ×2 IMPLANT
TOWEL OR 17X24 6PK STRL BLUE (TOWEL DISPOSABLE) ×2 IMPLANT
TOWEL OR 17X26 10 PK STRL BLUE (TOWEL DISPOSABLE) ×2 IMPLANT

## 2015-10-12 NOTE — Anesthesia Procedure Notes (Signed)
Procedure Name: Intubation Date/Time: 10/12/2015 1:31 PM Performed by: Fabian November Pre-anesthesia Checklist: Patient identified, Timeout performed, Emergency Drugs available, Suction available and Patient being monitored Patient Re-evaluated:Patient Re-evaluated prior to inductionOxygen Delivery Method: Circle system utilized Preoxygenation: Pre-oxygenation with 100% oxygen Intubation Type: IV induction, Rapid sequence and Cricoid Pressure applied Laryngoscope Size: Miller and 3 Grade View: Grade I Tube type: Oral Tube size: 7.5 mm Number of attempts: 1 Placement Confirmation: ETT inserted through vocal cords under direct vision,  breath sounds checked- equal and bilateral and positive ETCO2 Secured at: 22 cm Tube secured with: Tape Dental Injury: Teeth and Oropharynx as per pre-operative assessment

## 2015-10-12 NOTE — Progress Notes (Signed)
Family has clothing, earrings, and glasses.

## 2015-10-12 NOTE — ED Notes (Signed)
Cefazolin sent to OR with pt.  All belongings and jewelry given to son.  Consent signed.

## 2015-10-12 NOTE — Progress Notes (Signed)
Pt admitted to 6N26 via stretcher from PACU.  Pt AAO X 4.  Pt on RA.  Pt has 18g to RT [facility] with fluids infusing.  Pt has MLA incision with skin glue.  SCDs in place.  Pt has family and belongings to bedside.  Report rcvd from [place], [place].  Pt has no questions at the moment.  Will continue to monitor.

## 2015-10-12 NOTE — H&P (Signed)
[name] is an 65 y.o. female.   Chief Complaint: umbilical hernia HPI: [name] is a 12 year history of a large umbilical hernia that extends from the lower portion of her umbilicus. Recently it has been bothering her more. A couple months ago and was acutely painful but that episode lasted a short period of time. Today, the pain returned and the hernia seemed larger. She came to the emergency room for evaluation. She claims that it never reduces spontaneously. Last bowel movement was yesterday. Otherwise no changes in bowel habits.  Past Medical History  Diagnosis Date  . CHF (congestive heart failure) (HCC)   . Arthritis   . Hypertension     Past Surgical History  Procedure Laterality Date  . Cholecystectomy      History reviewed. No pertinent family history. Social History:  reports that she has quit smoking. She has never used smokeless tobacco. She reports that she does not drink alcohol or use illicit drugs.  Allergies: No Known Allergies   (Not in a hospital admission)  Results for orders placed or performed during the hospital encounter of 10/12/15 (from the past 48 hour(s))  CBC with Differential/Platelet     Status: Abnormal   Collection Time: 10/12/15 11:46 AM  Result Value Ref Range   WBC 7.1 4.0 - 10.5 K/uL   RBC 4.77 3.87 - 5.11 MIL/uL   Hemoglobin 14.8 12.0 - 15.0 g/dL   HCT 52.8 41.3 - 24.4 %   MCV 92.5 78.0 - 100.0 fL   MCH 31.0 26.0 - 34.0 pg   MCHC 33.6 30.0 - 36.0 g/dL   RDW 01.0 27.2 - 53.6 %   Platelets 148 (L) 150 - 400 K/uL   Neutrophils Relative % 77 %   Neutro Abs 5.5 1.7 - 7.7 K/uL   Lymphocytes Relative 17 %   Lymphs Abs 1.2 0.7 - 4.0 K/uL   Monocytes Relative 4 %   Monocytes Absolute 0.3 0.1 - 1.0 K/uL   Eosinophils Relative 1 %   Eosinophils Absolute 0.1 0.0 - 0.7 K/uL   Basophils Relative 1 %   Basophils Absolute 0.0 0.0 - 0.1 K/uL   No results found.  Review of Systems  Constitutional: Negative for fever.  Eyes:  Negative.   Respiratory: Negative for cough.   Cardiovascular: Negative for chest pain.  Gastrointestinal: Positive for abdominal pain. Negative for nausea, vomiting, diarrhea and constipation.  Genitourinary: Negative.   Musculoskeletal: Negative.   Skin: Negative.   Neurological: Negative.  Negative for headaches.  Endo/Heme/Allergies: Negative.   Psychiatric/Behavioral: Negative.     Blood pressure 144/74, pulse 49, temperature 97.7 F (36.5 C), temperature source Oral, resp. rate 12, SpO2 100 %. Physical Exam  Constitutional: She is oriented to person, place, and time. She appears well-developed and well-nourished. No distress.  HENT:  Head: Normocephalic.  Nose: Nose normal.  Mouth/Throat: Oropharynx is clear and moist.  Eyes: EOM are normal. Pupils are equal, round, and reactive to light.  Neck: No tracheal deviation present.  Cardiovascular: Regular rhythm, normal heart sounds and intact distal pulses.   HR 50  Respiratory: Effort normal and breath sounds normal. No stridor. No respiratory distress. She has no wheezes.  GI: Soft. She exhibits no distension. There is tenderness. There is no rebound and no guarding.  Tender mass inferior to umbilicus, consistent with large umbilical hernia, nonreducible  Musculoskeletal: Normal range of motion.  Venous stasis changes bilateral lower extremities  Neurological: She is alert and oriented to person,  place, and time. She exhibits normal muscle tone.  Skin: Skin is warm.  Psychiatric: She has a normal mood and affect.     Assessment/Plan Large incarcerated umbilical hernia - I recommended emergent repair with mesh. Procedure, risks, and benefits were discussed in detail with her and her son. We will proceed today. IV antibiotics for surgical prophylaxis.  Liz Malady, MD 10/12/2015, 12:12 PM

## 2015-10-12 NOTE — ED Notes (Signed)
Pt from home via [facility] with c/o umbilical hernia pain starting today around 830 am.  Pt reports she has had the hernia for years without complication, today hernia appears to be non-reducable without cause for change.  Initial pain 10/10 with nausea and emesis x 1 with EMS.  Given 4 mg zofran and 50 mcg fentanyl with pain decrease to 1/10.  12 lead unremarkable.  Last BM normal, no dysuria.  NAD, A&O.

## 2015-10-12 NOTE — Anesthesia Postprocedure Evaluation (Signed)
Anesthesia Post Note  Patient: [name]  Procedure(s) Performed: Procedure(s) (LRB): HERNIA REPAIR UMBILICAL ADULT/INCARERATED (N/A) PARTIAL OMENTECTOMY (N/A)  Patient location during evaluation: PACU Anesthesia Type: General Level of consciousness: awake and alert and oriented Pain management: pain level controlled Vital Signs Assessment: post-procedure vital signs reviewed and stable Respiratory status: spontaneous breathing, nonlabored ventilation, respiratory function stable and patient connected to nasal cannula oxygen Cardiovascular status: blood pressure returned to baseline and stable Postop Assessment: no signs of nausea or vomiting Anesthetic complications: no    Last Vitals:  Filed Vitals:   10/12/15 1445 10/12/15 1449  BP:  120/66  Pulse: 69 71  Temp:    Resp: 16 10    Last Pain:  Filed Vitals:   10/12/15 1453  PainSc: 4                  Makyra Corprew A.

## 2015-10-12 NOTE — ED Provider Notes (Signed)
CSN: [id number]     Arrival date & time 10/12/15  1037 History   First MD Initiated Contact with Patient 10/12/15 1037     Chief Complaint  Patient presents with  . Hernia  . Abdominal Pain     (Consider location/radiation/quality/duration/timing/severity/associated sxs/prior Treatment) HPI Comments: Patient here complaining of epigastric abdominal pain is localized to the site of where her hernia is. She has a known history of umbilical hernia and did have emesis 1. No fever or chills. No diarrhea. No anginal quality to her symptoms. Patient attempted to reduce her hernia without success. This area normally is soft but is now firm to palpation. Called EMS and was given fentanyl and Zofran prior to arrival.  Patient is a 65 y.o. female presenting with abdominal pain. The history is provided by the patient.  Abdominal Pain   Past Medical History  Diagnosis Date  . CHF (congestive heart failure) (HCC)   . Arthritis   . Hypertension    Past Surgical History  Procedure Laterality Date  . Cholecystectomy     History reviewed. No pertinent family history. Social History  Substance Use Topics  . Smoking status: Former Games developer  . Smokeless tobacco: Never Used  . Alcohol Use: No   OB History    No data available     Review of Systems  Gastrointestinal: Positive for abdominal pain.  All other systems reviewed and are negative.     Allergies  Review of patient's allergies indicates no known allergies.  Home Medications   Prior to Admission medications   Medication Sig Start Date End Date Taking? Authorizing Provider  ibuprofen (ADVIL,MOTRIN) 800 MG tablet Take 1 tablet (800 mg total) by mouth every 8 (eight) hours as needed. 03/08/15   Evangeline Dakin, PA-C  oxyCODONE-acetaminophen (ROXICET) 5-325 MG per tablet Take 1-2 tablets by mouth every 4 (four) hours as needed for severe pain. 03/08/15   Charmayne Sheer Beers, PA-C  sulfamethoxazole-trimethoprim (BACTRIM DS,SEPTRA DS)  800-160 MG per tablet Take 1 tablet by mouth 2 (two) times daily. 03/08/15   Charmayne Sheer Beers, PA-C   BP 152/68 mmHg  Pulse 48  Temp(Src) 97.7 F (36.5 C) (Oral)  Resp 20  SpO2 100% Physical Exam  Constitutional: She is oriented to person, place, and time. She appears well-developed and well-nourished.  Non-toxic appearance. No distress.  HENT:  Head: Normocephalic and atraumatic.  Eyes: Conjunctivae, EOM and lids are normal. Pupils are equal, round, and reactive to light.  Neck: Normal range of motion. Neck supple. No tracheal deviation present. No thyroid mass present.  Cardiovascular: Normal rate, regular rhythm and normal heart sounds.  Exam reveals no gallop.   No murmur heard. Pulmonary/Chest: Effort normal and breath sounds normal. No stridor. No respiratory distress. She has no decreased breath sounds. She has no wheezes. She has no rhonchi. She has no rales.  Abdominal: Soft. Normal appearance and bowel sounds are normal. She exhibits no distension. There is tenderness in the periumbilical area. There is no rebound and no CVA tenderness.    Musculoskeletal: Normal range of motion. She exhibits no edema or tenderness.  Neurological: She is alert and oriented to person, place, and time. She has normal strength. No cranial nerve deficit or sensory deficit. GCS eye subscore is 4. GCS verbal subscore is 5. GCS motor subscore is 6.  Skin: Skin is warm and dry. No abrasion and no rash noted.  Psychiatric: She has a normal mood and affect. Her speech is normal and behavior  is normal.  Nursing note and vitals reviewed.   ED Course  Procedures (including critical care time) Labs Review Labs Reviewed  CBC WITH DIFFERENTIAL/PLATELET  COMPREHENSIVE METABOLIC PANEL  LIPASE, BLOOD    Imaging Review No results found. I have personally reviewed and evaluated these images and lab results as part of my medical decision-making.   EKG Interpretation   Date/Time:  Monday October 12 2015  10:43:28 EST Ventricular Rate:  48 PR Interval:  184 QRS Duration: 108 QT Interval:  462 QTC Calculation: 413 R Axis:   -5 Text Interpretation:  Sinus bradycardia Confirmed by Freida Busman  MD, Joshlyn Beadle  ([id number]) on 10/12/2015 11:04:26 AM      MDM   Final diagnoses:  None    Patient with evidence of incarcerated umbilical hernia and counseled general surgery made and they will take the patient to the operating room    Lorre Nick, MD 10/12/15 1219

## 2015-10-12 NOTE — ED Notes (Signed)
Patient transported to X-ray 

## 2015-10-12 NOTE — Anesthesia Preprocedure Evaluation (Addendum)
Anesthesia Evaluation  Patient identified by MRN, date of birth, ID band Patient awake  General Assessment Comment:Oatmeal at 0800  Reviewed: Allergy & Precautions, NPO status , Patient's Chart, lab work & pertinent test results, reviewed documented beta blocker date and time   History of Anesthesia Complications Negative for: history of anesthetic complications  Airway Mallampati: II  TM Distance: >3 FB Neck ROM: Full    Dental  (+) Teeth Intact, Dental Advisory Given   Pulmonary former smoker,    breath sounds clear to auscultation       Cardiovascular hypertension, Pt. on medications and Pt. on home beta blockers +CHF and + PND  + Valvular Problems/Murmurs  Rhythm:Regular Rate:Normal     Neuro/Psych    GI/Hepatic Neg liver ROS, GERD  Controlled,  Endo/Other  Hypothyroidism   Renal/GU negative Renal ROS     Musculoskeletal  (+) Arthritis , Osteoarthritis,    Abdominal (+) + obese,  Abdomen: tender.    Peds  Hematology negative hematology ROS (+)   Anesthesia Other Findings   Reproductive/Obstetrics                            Anesthesia Physical Anesthesia Plan  ASA: III and emergent  Anesthesia Plan: General   Post-op Pain Management:    Induction: Intravenous, Rapid sequence and Cricoid pressure planned  Airway Management Planned: Oral ETT  Additional Equipment:   Intra-op Plan:   Post-operative Plan:   Informed Consent: I have reviewed the patients History and Physical, chart, labs and discussed the procedure including the risks, benefits and alternatives for the proposed anesthesia with the patient or authorized representative who has indicated his/her understanding and acceptance.     Plan Discussed with: CRNA and Anesthesiologist  Anesthesia Plan Comments:         Anesthesia Quick Evaluation

## 2015-10-12 NOTE — Op Note (Signed)
10/12/2015  2:33 PM  PATIENT:  [name]  65 y.o. female  PRE-OPERATIVE DIAGNOSIS:  Incarcerated umbilical hernia  POST-OPERATIVE DIAGNOSIS:  Incarcerated umbilical hernia  PROCEDURE:  Procedure(s): HERNIA REPAIR UMBILICAL ADULT/INCARERATED PARTIAL OMENTECTOMY INSERTION OF MESH 8CM VENTRALEX   SURGEON:  Surgeon(s): Violeta Gelinas, MD  ASSISTANTS: none   ANESTHESIA:   local and general  EBL:     BLOOD ADMINISTERED:none  DRAINS: none   SPECIMEN:  No Specimen  DISPOSITION OF SPECIMEN:  N/A  COUNTS:  YES  DICTATION: .Dragon Dictation Findings: 2 hernia defects, hernia sacs only contained omentum  Procedure in detail: [name] is brought for emergent repair of incarcerated umbilical hernia with mesh. Informed consent was obtained. She received intravenous antibiotics. She was brought to the operative room and general endotracheal anesthesia was administered by the anesthesia staff. Her abdomen was prepped and draped in sterile fashion. We did time out procedure. Local was injected. Infraumbilical incision was made. Subcutaneous tissues were dissected down revealing the hernia sac. This was large hernia sac containing omentum. It was opened and contained omentum which was chronically incarcerated this omentum was removed with the LigaSure achieving excellent hemostasis. The hernia sac was excised. Further inspection revealed a second defect just cephalad to this defect underneath the umbilicus. The sac was dissected off the umbilical skin and excised as well. The small bridge of fascia was divided between the 2. The fascia of both was cleared away. Next, a #1 Novafil figure-of-eight stitch was placed at the superior and inferior portion of the defect. Next an 8 cm Venralex mesh was inserted and this was secured with multiple interrupted #1 Novafil sutures. This included transfacial sutures at 3:00 and 9:00 which went down and tacked to the lateral portions of the mesh. Once  the mesh was secured, the fascia was closed over the top of it with interrupted #1 Novafil sutures. The area was copiously irrigated. Additional local was injected. Hemostasis was ensured. Subcutaneous tissues were approximated with interrupted 2-0 Vicryl. Skin was closed with running 4-0 Monocryl subcuticular followed by Dermabond. All counts were correct. She tolerated procedure well without apparent complication was taken recovery in stable condition. PATIENT DISPOSITION:  PACU - hemodynamically stable.   Delay start of Pharmacological VTE agent (>24hrs) due to surgical blood loss or risk of bleeding:  no  Violeta Gelinas, MD, MPH, FACS Pager: [phone]  2/20/20172:33 PM

## 2015-10-12 NOTE — Transfer of Care (Signed)
Immediate Anesthesia Transfer of Care Note  Patient: [name]  Procedure(s) Performed: Procedure(s): HERNIA REPAIR UMBILICAL ADULT/INCARERATED (N/A) PARTIAL OMENTECTOMY (N/A)  Patient Location: PACU  Anesthesia Type:General  Level of Consciousness: awake, alert  and oriented  Airway & Oxygen Therapy: Patient Spontanous Breathing and Patient connected to nasal cannula oxygen  Post-op Assessment: Report given to RN and Post -op Vital signs reviewed and stable  Post vital signs: Reviewed and stable  Last Vitals:  Filed Vitals:   10/12/15 1100 10/12/15 1130  BP: 152/80 144/74  Pulse: 48 49  Temp:    Resp: 10 12    Complications: No apparent anesthesia complications

## 2015-10-13 ENCOUNTER — Encounter (HOSPITAL_COMMUNITY): Payer: Self-pay | Admitting: General Surgery

## 2015-10-13 MED ORDER — HYDROMORPHONE HCL 1 MG/ML IJ SOLN: 0.5000 mg | INTRAMUSCULAR | Status: DC | PRN

## 2015-10-13 MED ORDER — OXYCODONE-ACETAMINOPHEN 5-325 MG PO TABS
1.0000 | ORAL_TABLET | ORAL | Status: DC | PRN
  Administered 2015-10-13 – 2015-10-14 (×3): 1 via ORAL
  Filled 2015-10-13: qty 1
  Filled 2015-10-13 (×2): qty 2

## 2015-10-13 MED ORDER — OXYCODONE-ACETAMINOPHEN 5-325 MG PO TABS: 1.0000 | ORAL_TABLET | ORAL | Status: DC | PRN

## 2015-10-13 MED ORDER — ACETAMINOPHEN 325 MG PO TABS: 650.0000 mg | ORAL_TABLET | Freq: Four times a day (QID) | ORAL | Status: DC | PRN

## 2015-10-13 NOTE — Progress Notes (Signed)
She looks fine, couldn't walk back to room without pain med.  I don't see the orthostatic BP's ordered early this AM.  She does not feel she is ready for discharge.  Aim for d/c in AM.

## 2015-10-13 NOTE — Care Management Note (Signed)
Case Management Note  Patient Details  Name: [name] MRN: [id number] Date of Birth: Apr 28, 1951  Subjective/Objective:                    Action/Plan:  Initial UR completed  Expected Discharge Date:                  Expected Discharge Plan:  Home/Self Care  In-House Referral:     Discharge planning Services     Post Acute Care Choice:    Choice offered to:     DME Arranged:    DME Agency:     HH Arranged:    HH Agency:     Status of Service:  In process, will continue to follow  Medicare Important Message Given:    Date Medicare IM Given:    Medicare IM give by:    Date Additional Medicare IM Given:    Additional Medicare Important Message give by:     If discussed at Long Length of Stay Meetings, dates discussed:    Additional Comments:  [name], RN 10/13/2015, 8:17 AM

## 2015-10-13 NOTE — Discharge Instructions (Signed)
[facility] _______[facility]  UMBILICAL OR INGUINAL HERNIA REPAIR: POST OP INSTRUCTIONS  Always review your discharge instruction sheet given to you by the facility where your surgery was performed. IF YOU HAVE DISABILITY OR FAMILY LEAVE FORMS, YOU MUST BRING THEM TO THE OFFICE FOR PROCESSING.   DO NOT GIVE THEM TO YOUR DOCTOR.  1. A  prescription for pain medication may be given to you upon discharge.  Take your pain medication as prescribed, if needed.  If narcotic pain medicine is not needed, then you may take acetaminophen (Tylenol) or ibuprofen (Advil) as needed. 2. Take your usually prescribed medications unless otherwise directed. 3. If you need a refill on your pain medication, please contact your pharmacy.  They will contact our office to request authorization. Prescriptions will not be filled after 5 pm or on week-ends. 4. You should follow a light diet the first 24 hours after arrival home, such as soup and crackers, etc.  Be sure to include lots of fluids daily.  Resume your normal diet the day after surgery. 5. Most patients will experience some swelling and bruising around the umbilicus or in the groin and scrotum.  Ice packs and reclining will help.  Swelling and bruising can take several days to resolve.  6. It is common to experience some constipation if taking pain medication after surgery.  Increasing fluid intake and taking a stool softener (such as Colace) will usually help or prevent this problem from occurring.  A mild laxative (Milk of Magnesia or Miralax) should be taken according to package directions if there are no bowel movements after 48 hours. 7. Unless discharge instructions indicate otherwise, you may remove your bandages 24-48 hours after surgery, and you may shower at that time.  You may have steri-strips (small skin tapes) in place directly over the incision.  These strips should be left on the skin for 7-10 days.  If your surgeon used skin glue on the  incision, you may shower in 24 hours.  The glue will flake off over the next 2-3 weeks.  Any sutures or staples will be removed at the office during your follow-up visit. 8. ACTIVITIES:  You may resume regular (light) daily activities beginning the next day--such as daily self-care, walking, climbing stairs--gradually increasing activities as tolerated.  You may have sexual intercourse when it is comfortable.  Refrain from any heavy lifting or straining until approved by your doctor. a. You may drive when you are no longer taking prescription pain medication, you can comfortably wear a seatbelt, and you can safely maneuver your car and apply brakes. b. RETURN TO WORK:  __________________________________________________________ 9. You should see your doctor in the office for a follow-up appointment approximately 2-3 weeks after your surgery.  Make sure that you call for this appointment within a day or two after you arrive home to insure a convenient appointment time. 10. OTHER INSTRUCTIONS:  __________________________________________________________________________________________________________________________________________________________________________________________  WHEN TO CALL YOUR DOCTOR: 1. Fever over 101.0 2. Inability to urinate 3. Nausea and/or vomiting 4. Extreme swelling or bruising 5. Continued bleeding from incision. 6. Increased pain, redness, or drainage from the incision  The clinic staff is available to answer your questions during regular business hours.  Please don't hesitate to call and ask to speak to one of the nurses for clinical concerns.  If you have a medical emergency, go to the nearest emergency room or call 911.  A surgeon from [facility] is always on call at the hospital     [address], [address], [place], [place]  [address] ?  [address] [address], [place],    [address] [phone] ? [phone] ? FAX [phone] Web site:  [email or web address]  

## 2015-10-13 NOTE — Progress Notes (Signed)
1 Day Post-Op  Subjective: She says she felt a bit sweaty after getting up this AM.  She has had only clears, and is having more pain than she expected.  She says she cannot take NSAIDS.    Objective: Vital signs in last 24 hours: Temp:  [97.7 F (36.5 C)-98.6 F (37 C)] 98.4 F (36.9 C) (02/21 0516) Pulse Rate:  [48-77] 62 (02/21 0516) Resp:  [9-20] 20 (02/21 0516) BP: (103-152)/(48-84) 130/52 mmHg (02/21 0522) SpO2:  [95 %-100 %] 100 % (02/21 0516) Weight:  [109.997 kg (242 lb 8 oz)] 109.997 kg (242 lb 8 oz) (02/20 1950) Last BM Date:  (PTA) 900 recorded Urine 300 recorded Diet:  Clear Afebrile, VSS Labs OK  Intake/Output from previous day: 02/20 0701 - 02/21 0700 In: 900 [I.V.:900] Out: 300 [Urine:300] Intake/Output this shift:    General appearance: alert, cooperative and no distress Resp: clear to auscultation bilaterally GI: very sore, site looks fine and she is doing well with clears.  Lab Results:   Recent Labs  10/12/15 1146  WBC 7.1  HGB 14.8  HCT 44.1  PLT 148*    BMET  Recent Labs  10/12/15 1146  NA 143  K 4.2  CL 106  CO2 23  GLUCOSE 92  BUN 18  CREATININE 0.78  CALCIUM 9.5   PT/INR No results for input(s): LABPROT, INR in the last 72 hours.   Recent Labs Lab 10/12/15 1146  AST 33  ALT 26  ALKPHOS 83  BILITOT 0.8  PROT 6.8  ALBUMIN 3.5     Lipase     Component Value Date/Time   LIPASE 30 10/12/2015 1146     Studies/Results: Dg Abd Acute W/chest  10/12/2015  CLINICAL DATA:  c/o umbilical hernia pain starting today around 830 am. Pt reports she has had the hernia for years without complication, today hernia appears to be non-reducable without cause for change. Initial pain 10/10 with nausea and emesis x 1 with EMS. EXAM: DG ABDOMEN ACUTE W/ 1V CHEST COMPARISON:  None. FINDINGS: Heart size and mediastinal contours are within normal limits. Lungs are clear. No effusion. No free air. Paucity small bowel gas. Fluid levels in  right lower quadrant nondilated small bowel loops. Moderate fecal material throughout the nondilated colon. Bilateral pelvic vascular calcifications. Surgical clips right upper abdomen. Regional bones unremarkable. IMPRESSION: No acute cardiopulmonary disease. Nonobstructive bowel gas pattern.  No free air. Electronically Signed   By: Corlis Leak M.D.   On: 10/12/2015 12:48    Medications: .  ceFAZolin (ANCEF) IV  2 g Intravenous Once  . enoxaparin (LOVENOX) injection  40 mg Subcutaneous Q24H  . furosemide  20 mg Oral Daily  . levothyroxine  75 mcg Oral BH-q7a  . metoprolol  100 mg Oral BID  . pantoprazole (PROTONIX) IV  40 mg Intravenous QHS    Assessment/Plan Large incarcerated umbilical hernia  S/p HERNIA REPAIR UMBILICAL ADULT/INCARERATED, PARTIAL OMENTECTOMY, INSERTION OF MESH 8CM VENTRALEX, 10/12/15, Dr. Violeta Gelinas CHF Arthritis Hypertension ABX:   pre op only DVT:  Lovenox/SCD  Plan:  Plan:  Mobilize, advance diet and send her home when she is ready, hopefully today.  I will get some orthostatics just to be sure this is not an issue.         LOS: 1 day    Michelangelo Rindfleisch 10/13/2015

## 2015-10-14 NOTE — Progress Notes (Signed)
2 Days Post-Op  Subjective: Feels better, complaints of some muscle spasm.  Doing fine and we will let her go home this AM.   Objective: Vital signs in last 24 hours: Temp:  [98.2 F (36.8 C)-99 F (37.2 C)] 98.2 F (36.8 C) (02/22 0537) Pulse Rate:  [58-66] 58 (02/22 0537) Resp:  [18] 18 (02/22 0537) BP: (111-129)/(52-60) 129/58 mmHg (02/22 0537) SpO2:  [100 %] 100 % (02/22 0537) Last BM Date: 10/11/15 1320  PO Voided x 7 Afebrile, VSS  Intake/Output from previous day: 02/21 0701 - 02/22 0700 In: 7216.7 [P.O.:1320; I.V.:5696.7; IV Piggyback:200] Out: -  Intake/Output this shift:    General appearance: alert, cooperative and no distress GI: soft, sore, incision looks fine.  Tolerating diet well.  Lab Results:   Recent Labs  10/12/15 1146  WBC 7.1  HGB 14.8  HCT 44.1  PLT 148*    BMET  Recent Labs  10/12/15 1146  NA 143  K 4.2  CL 106  CO2 23  GLUCOSE 92  BUN 18  CREATININE 0.78  CALCIUM 9.5   PT/INR No results for input(s): LABPROT, INR in the last 72 hours.   Recent Labs Lab 10/12/15 1146  AST 33  ALT 26  ALKPHOS 83  BILITOT 0.8  PROT 6.8  ALBUMIN 3.5     Lipase     Component Value Date/Time   LIPASE 30 10/12/2015 1146     Studies/Results: Dg Abd Acute W/chest  10/12/2015  CLINICAL DATA:  c/o umbilical hernia pain starting today around 830 am. Pt reports she has had the hernia for years without complication, today hernia appears to be non-reducable without cause for change. Initial pain 10/10 with nausea and emesis x 1 with EMS. EXAM: DG ABDOMEN ACUTE W/ 1V CHEST COMPARISON:  None. FINDINGS: Heart size and mediastinal contours are within normal limits. Lungs are clear. No effusion. No free air. Paucity small bowel gas. Fluid levels in right lower quadrant nondilated small bowel loops. Moderate fecal material throughout the nondilated colon. Bilateral pelvic vascular calcifications. Surgical clips right upper abdomen. Regional bones  unremarkable. IMPRESSION: No acute cardiopulmonary disease. Nonobstructive bowel gas pattern.  No free air. Electronically Signed   By: Corlis Leak M.D.   On: 10/12/2015 12:48    Medications: .  ceFAZolin (ANCEF) IV  2 g Intravenous Once  . enoxaparin (LOVENOX) injection  40 mg Subcutaneous Q24H  . furosemide  20 mg Oral Daily  . levothyroxine  75 mcg Oral BH-q7a  . metoprolol  100 mg Oral BID  . pantoprazole (PROTONIX) IV  40 mg Intravenous QHS    Assessment/Plan Large incarcerated umbilical hernia  S/p HERNIA REPAIR UMBILICAL ADULT/INCARERATED, PARTIAL OMENTECTOMY, INSERTION OF MESH 8CM VENTRALEX, 10/12/15, Dr. Violeta Gelinas CHF Arthritis Hypertension ABX: pre op only DVT: Lovenox/SCD    Plan:  Home today.   LOS: 2 days    Lyne Khurana 10/14/2015

## 2015-10-14 NOTE — Progress Notes (Signed)
Discharge instructions gone over with patient and husband present. Prescription given. Home medications gone over. Incisional care, diet, activity  and reasons to call the doctor discussed. Patient verbalized understanding of instructions.

## 2015-10-14 NOTE — Clinical Documentation Improvement (Signed)
General Surgery  Can the diagnosis of CHF be further specified?    Acuity - Acute, Chronic, Acute on Chronic   Type - Systolic, Diastolic, Systolic and Diastolic  Other  Clinically Undetermined  Please exercise your independent, professional judgment when responding. A specific answer is not anticipated or expected.   Thank You,  Cherylann Ratel, RN, BSN Health Information Management [place] [phone]

## 2015-10-15 ENCOUNTER — Other Ambulatory Visit (HOSPITAL_COMMUNITY): Payer: Medicare Other

## 2015-10-15 ENCOUNTER — Encounter (HOSPITAL_COMMUNITY): Payer: Self-pay | Admitting: General Surgery

## 2015-10-15 DIAGNOSIS — I10 Essential (primary) hypertension: Secondary | ICD-10-CM | POA: Diagnosis present

## 2015-10-15 DIAGNOSIS — E039 Hypothyroidism, unspecified: Secondary | ICD-10-CM | POA: Diagnosis present

## 2015-10-15 HISTORY — DX: Essential (primary) hypertension: I10

## 2015-10-15 HISTORY — DX: Hypothyroidism, unspecified: E03.9

## 2015-10-15 NOTE — Discharge Summary (Signed)
Physician Discharge Summary  Patient ID: [name] MRN: [id number] DOB/AGE: 1951/06/26 65 y.o.  Admit date: 10/12/2015 Discharge date: 10/15/2015  Admission Diagnoses:  Large incarcerated umbilical hernia  CHF by Hx Arthritis Hypertension Discharge Diagnoses:  Same    Principal Problem:   Incarcerated umbilical hernia Active Problems:   Essential hypertension   Hypothyroidism   PROCEDURES: S/p HERNIA REPAIR UMBILICAL ADULT/INCARERATED, PARTIAL OMENTECTOMY, INSERTION OF MESH 8CM VENTRALEX, 10/12/15, Dr. Gloris Ham Course:  [name] is a 12 year history of a large umbilical hernia that extends from the lower portion of her umbilicus. Recently it has been bothering her more. A couple months ago and was acutely painful but that episode lasted a short period of time. Today, the pain returned and the hernia seemed larger. She came to the emergency room for evaluation. She claims that it never reduces spontaneously. Last bowel movement was yesterday. Otherwise no changes in bowel habits. She was seen in the ED and admitted.  She was taken to the OR later that afternoon for repair. Bowel was not involved.  She did well post op and was ready for discharge on her second post op day.  Condition on D/C:  Improved     Disposition: 01-Home or Self Care     Medication List    TAKE these medications        acetaminophen 650 MG CR tablet  Commonly known as:  TYLENOL  Take 1,300 mg by mouth at bedtime.     acetaminophen 325 MG tablet  Commonly known as:  TYLENOL  Take 2 tablets (650 mg total) by mouth every 6 (six) hours as needed for mild pain (you cannot take more than 4000 mg of Tylenol (acetaminophen) per day.  it is in your prescribed pain med.).     furosemide 20 MG tablet  Commonly known as:  LASIX  Take 20 mg by mouth daily.     levothyroxine 75 MCG tablet  Commonly known as:  SYNTHROID, LEVOTHROID  Take 1 tablet by mouth every morning.     metoprolol 100 MG tablet  Commonly known as:  LOPRESSOR  Take 1 tablet by mouth 2 (two) times daily.     oxyCODONE-acetaminophen 5-325 MG tablet  Commonly known as:  PERCOCET/ROXICET  Take 1-2 tablets by mouth every 4 (four) hours as needed for moderate pain.     Vitamin D3 2000 units Tabs  Take 1 tablet by mouth every evening.       Follow-up Information    Follow up with [facility].   Specialty:  Rheumatology   Why:  Call if you have issues with your blood pressure and follow up for medical issues.   Contact information:   [address] [place] [address] [phone]       Follow up with [facility] On 11/04/2015.   Specialty:  General Surgery   Why:  Your appointment is at 8:45 AM, be at the office 30 minutes early for check in.   Contact information:   [address] [place] [address] [phone]       Signed: Sherrie George 10/15/2015, 4:12 PM

## 2015-11-12 ENCOUNTER — Other Ambulatory Visit: Payer: Self-pay

## 2015-11-12 ENCOUNTER — Ambulatory Visit (HOSPITAL_COMMUNITY): Payer: Medicare Other | Attending: Cardiology

## 2015-11-12 DIAGNOSIS — I509 Heart failure, unspecified: Secondary | ICD-10-CM | POA: Insufficient documentation

## 2015-11-12 DIAGNOSIS — I110 Hypertensive heart disease with heart failure: Secondary | ICD-10-CM | POA: Insufficient documentation

## 2015-11-12 DIAGNOSIS — I071 Rheumatic tricuspid insufficiency: Secondary | ICD-10-CM | POA: Insufficient documentation

## 2015-11-12 DIAGNOSIS — Z87891 Personal history of nicotine dependence: Secondary | ICD-10-CM | POA: Diagnosis not present

## 2015-11-12 DIAGNOSIS — I059 Rheumatic mitral valve disease, unspecified: Secondary | ICD-10-CM | POA: Diagnosis not present

## 2015-11-12 DIAGNOSIS — R011 Cardiac murmur, unspecified: Secondary | ICD-10-CM | POA: Diagnosis not present

## 2015-12-03 DIAGNOSIS — M170 Bilateral primary osteoarthritis of knee: Secondary | ICD-10-CM | POA: Diagnosis not present

## 2016-02-18 DIAGNOSIS — M1712 Unilateral primary osteoarthritis, left knee: Secondary | ICD-10-CM | POA: Diagnosis not present

## 2016-03-14 ENCOUNTER — Ambulatory Visit: Payer: Self-pay | Admitting: Orthopedic Surgery

## 2016-03-24 DIAGNOSIS — Z1389 Encounter for screening for other disorder: Secondary | ICD-10-CM | POA: Diagnosis not present

## 2016-03-24 DIAGNOSIS — M8580 Other specified disorders of bone density and structure, unspecified site: Secondary | ICD-10-CM | POA: Diagnosis not present

## 2016-03-24 DIAGNOSIS — E559 Vitamin D deficiency, unspecified: Secondary | ICD-10-CM | POA: Diagnosis not present

## 2016-03-24 DIAGNOSIS — I129 Hypertensive chronic kidney disease with stage 1 through stage 4 chronic kidney disease, or unspecified chronic kidney disease: Secondary | ICD-10-CM | POA: Diagnosis not present

## 2016-03-24 DIAGNOSIS — E6609 Other obesity due to excess calories: Secondary | ICD-10-CM | POA: Diagnosis not present

## 2016-03-24 DIAGNOSIS — E039 Hypothyroidism, unspecified: Secondary | ICD-10-CM | POA: Diagnosis not present

## 2016-03-24 DIAGNOSIS — Z0000 Encounter for general adult medical examination without abnormal findings: Secondary | ICD-10-CM | POA: Diagnosis not present

## 2016-03-28 ENCOUNTER — Encounter (HOSPITAL_COMMUNITY): Payer: Self-pay

## 2016-03-28 ENCOUNTER — Encounter (HOSPITAL_COMMUNITY)
Admission: RE | Admit: 2016-03-28 | Discharge: 2016-03-28 | Disposition: A | Discharge status: Home / Self Care | Payer: Medicare Other | Source: Ambulatory Visit | Attending: Orthopedic Surgery | Admitting: Orthopedic Surgery

## 2016-03-28 DIAGNOSIS — I10 Essential (primary) hypertension: Secondary | ICD-10-CM | POA: Insufficient documentation

## 2016-03-28 DIAGNOSIS — I509 Heart failure, unspecified: Secondary | ICD-10-CM | POA: Diagnosis not present

## 2016-03-28 DIAGNOSIS — Z01812 Encounter for preprocedural laboratory examination: Secondary | ICD-10-CM | POA: Insufficient documentation

## 2016-03-28 DIAGNOSIS — E039 Hypothyroidism, unspecified: Secondary | ICD-10-CM | POA: Insufficient documentation

## 2016-03-28 HISTORY — DX: Cardiac murmur, unspecified: R01.1

## 2016-03-28 LAB — TYPE AND SCREEN
ABO/RH(D): B NEG
ANTIBODY SCREEN: POSITIVE
DAT, IGG: NEGATIVE
PT AG Type: NEGATIVE

## 2016-03-28 LAB — COMPREHENSIVE METABOLIC PANEL
ALK PHOS: 89 U/L (ref 38–126)
ALT: 14 U/L (ref 14–54)
ANION GAP: 7 (ref 5–15)
AST: 23 U/L (ref 15–41)
Albumin: 4.1 g/dL (ref 3.5–5.0)
BILIRUBIN TOTAL: 0.9 mg/dL (ref 0.3–1.2)
BUN: 23 mg/dL — ABNORMAL HIGH (ref 6–20)
CALCIUM: 9.4 mg/dL (ref 8.9–10.3)
CO2: 30 mmol/L (ref 22–32)
Chloride: 101 mmol/L (ref 101–111)
Creatinine, Ser: 0.79 mg/dL (ref 0.44–1.00)
GFR calc Af Amer: >60 mL/min (ref >60)
GLUCOSE: 86 mg/dL (ref 65–99)
POTASSIUM: 4.1 mmol/L (ref 3.5–5.1)
Sodium: 138 mmol/L (ref 135–145)
TOTAL PROTEIN: 7.3 g/dL (ref 6.5–8.1)

## 2016-03-28 LAB — SURGICAL PCR SCREEN
MRSA, PCR: NEGATIVE
Staphylococcus aureus: POSITIVE — AB

## 2016-03-28 LAB — PROTIME-INR
INR: 1.09
PROTHROMBIN TIME: 14.2 s (ref 11.4–15.2)

## 2016-03-28 LAB — APTT: aPTT: 32 seconds (ref 24–36)

## 2016-03-28 NOTE — Pre-Procedure Instructions (Signed)
DG Abd with Chest 10-12-15 epic Echo 11-12-15 epic EKG 10-13-15 epic

## 2016-03-28 NOTE — Pre-Procedure Instructions (Signed)
CBC 03-24-16 on chart UA 03-24-16 on chart

## 2016-03-28 NOTE — Patient Instructions (Signed)
CHARLINE HOSKINSON  03/28/2016   Your procedure is scheduled on: 04/04/16  Report to [facility] Main  Entrance take [facility]  elevators to 3rd floor to  Short Stay Center at 5:20 AM.  Call this number if you have problems the morning of surgery [phone]   Remember: ONLY 1 PERSON MAY GO WITH YOU TO SHORT STAY TO GET  READY MORNING OF YOUR SURGERY.  Do not eat food or drink liquids :After Midnight.     Take these medicines the morning of surgery with A SIP OF WATER: Levothyroxine (Synthroid), Metoprolol                               You may not have any metal on your body including hair pins and              piercings  Do not wear jewelry, make-up, lotions, powders or perfumes, deodorant             Do not wear nail polish.  Do not shave  48 hours prior to surgery.              Men may shave face and neck.   Do not bring valuables to the hospital. [facility] IS NOT             RESPONSIBLE   FOR VALUABLES.  Contacts, dentures or bridgework may not be worn into surgery.  Leave suitcase in the car. After surgery it may be brought to your room.               Please read over the following fact sheets you were given: _____________________________________________________________________             [facility] - Preparing for Surgery Before surgery, you can play an important role.  Because skin is not sterile, your skin needs to be as free of germs as possible.  You can reduce the number of germs on your skin by washing with CHG (chlorahexidine gluconate) soap before surgery.  CHG is an antiseptic cleaner which kills germs and bonds with the skin to continue killing germs even after washing. Please DO NOT use if you have an allergy to CHG or antibacterial soaps.  If your skin becomes reddened/irritated stop using the CHG and inform your nurse when you arrive at Short Stay. Do not shave (including legs and underarms) for at least 48 hours prior to the first CHG  shower.  You may shave your face/neck. Please follow these instructions carefully:  1.  Shower with CHG Soap the night before surgery and the  morning of Surgery.  2.  If you choose to wash your hair, wash your hair first as usual with your  normal  shampoo.  3.  After you shampoo, rinse your hair and body thoroughly to remove the  shampoo.                           4.  Use CHG as you would any other liquid soap.  You can apply chg directly  to the skin and wash                       Gently with a scrungie or clean washcloth.  5.  Apply the CHG Soap to your  body ONLY FROM THE NECK DOWN.   Do not use on face/ open                           Wound or open sores. Avoid contact with eyes, ears mouth and genitals (private parts).                       Wash face,  Genitals (private parts) with your normal soap.             6.  Wash thoroughly, paying special attention to the area where your surgery  will be performed.  7.  Thoroughly rinse your body with warm water from the neck down.  8.  DO NOT shower/wash with your normal soap after using and rinsing off  the CHG Soap.                9.  Pat yourself dry with a clean towel.            10.  Wear clean pajamas.            11.  Place clean sheets on your bed the night of your first shower and do not  sleep with pets. Day of Surgery : Do not apply any lotions/deodorants the morning of surgery.  Please wear clean clothes to the hospital/surgery center.  FAILURE TO FOLLOW THESE INSTRUCTIONS MAY RESULT IN THE CANCELLATION OF YOUR SURGERY PATIENT SIGNATURE_________________________________  NURSE SIGNATURE__________________________________  ________________________________________________________________________   Rogelia MireIncentive Spirometer  An incentive spirometer is a tool that can help keep your lungs clear and active. This tool measures how well you are filling your lungs with each breath. Taking long deep breaths may help reverse or decrease the chance  of developing breathing (pulmonary) problems (especially infection) following:  A long period of time when you are unable to move or be active. BEFORE THE PROCEDURE   If the spirometer includes an indicator to show your best effort, your nurse or respiratory therapist will set it to a desired goal.  If possible, sit up straight or lean slightly forward. Try not to slouch.  Hold the incentive spirometer in an upright position. INSTRUCTIONS FOR USE  1. Sit on the edge of your bed if possible, or sit up as far as you can in bed or on a chair. 2. Hold the incentive spirometer in an upright position. 3. Breathe out normally. 4. Place the mouthpiece in your mouth and seal your lips tightly around it. 5. Breathe in slowly and as deeply as possible, raising the piston or the ball toward the top of the column. 6. Hold your breath for 3-5 seconds or for as long as possible. Allow the piston or ball to fall to the bottom of the column. 7. Remove the mouthpiece from your mouth and breathe out normally. 8. Rest for a few seconds and repeat Steps 1 through 7 at least 10 times every 1-2 hours when you are awake. Take your time and take a few normal breaths between deep breaths. 9. The spirometer may include an indicator to show your best effort. Use the indicator as a goal to work toward during each repetition. 10. After each set of 10 deep breaths, practice coughing to be sure your lungs are clear. If you have an incision (the cut made at the time of surgery), support your incision when coughing by placing a pillow or rolled up towels firmly against it.  Once you are able to get out of bed, walk around indoors and cough well. You may stop using the incentive spirometer when instructed by your caregiver.  RISKS AND COMPLICATIONS  Take your time so you do not get dizzy or light-headed.  If you are in pain, you may need to take or ask for pain medication before doing incentive spirometry. It is harder to  take a deep breath if you are having pain. AFTER USE  Rest and breathe slowly and easily.  It can be helpful to keep track of a log of your progress. Your caregiver can provide you with a simple table to help with this. If you are using the spirometer at home, follow these instructions: SEEK MEDICAL CARE IF:   You are having difficultly using the spirometer.  You have trouble using the spirometer as often as instructed.  Your pain medication is not giving enough relief while using the spirometer.  You develop fever of 100.5 F (38.1 C) or higher. SEEK IMMEDIATE MEDICAL CARE IF:   You cough up bloody sputum that had not been present before.  You develop fever of 102 F (38.9 C) or greater.  You develop worsening pain at or near the incision site. MAKE SURE YOU:   Understand these instructions.  Will watch your condition.  Will get help right away if you are not doing well or get worse. Document Released: 12/19/2006 Document Revised: 10/31/2011 Document Reviewed: 02/19/2007 ExitCare Patient Information 2014 [employer], [place].   ________________________________________________________________________  WHAT IS A BLOOD TRANSFUSION? Blood Transfusion Information  A transfusion is the replacement of blood or some of its parts. Blood is made up of multiple cells which provide different functions.  Red blood cells carry oxygen and are used for blood loss replacement.  White blood cells fight against infection.  Platelets control bleeding.  Plasma helps clot blood.  Other blood products are available for specialized needs, such as hemophilia or other clotting disorders. BEFORE THE TRANSFUSION  Who gives blood for transfusions?   Healthy volunteers who are fully evaluated to make sure their blood is safe. This is blood bank blood. Transfusion therapy is the safest it has ever been in the practice of medicine. Before blood is taken from a donor, a complete history is taken to  make sure that person has no history of diseases nor engages in risky social behavior (examples are intravenous drug use or sexual activity with multiple partners). The donor's travel history is screened to minimize risk of transmitting infections, such as malaria. The donated blood is tested for signs of infectious diseases, such as HIV and hepatitis. The blood is then tested to be sure it is compatible with you in order to minimize the chance of a transfusion reaction. If you or a relative donates blood, this is often done in anticipation of surgery and is not appropriate for emergency situations. It takes many days to process the donated blood. RISKS AND COMPLICATIONS Although transfusion therapy is very safe and saves many lives, the main dangers of transfusion include:   Getting an infectious disease.  Developing a transfusion reaction. This is an allergic reaction to something in the blood you were given. Every precaution is taken to prevent this. The decision to have a blood transfusion has been considered carefully by your caregiver before blood is given. Blood is not given unless the benefits outweigh the risks. AFTER THE TRANSFUSION  Right after receiving a blood transfusion, you will usually feel much better and more energetic.  This is especially true if your red blood cells have gotten low (anemic). The transfusion raises the level of the red blood cells which carry oxygen, and this usually causes an energy increase.  The nurse administering the transfusion will monitor you carefully for complications. HOME CARE INSTRUCTIONS  No special instructions are needed after a transfusion. You may find your energy is better. Speak with your caregiver about any limitations on activity for underlying diseases you may have. SEEK MEDICAL CARE IF:   Your condition is not improving after your transfusion.  You develop redness or irritation at the intravenous (IV) site. SEEK IMMEDIATE MEDICAL CARE  IF:  Any of the following symptoms occur over the next 12 hours:  Shaking chills.  You have a temperature by mouth above 102 F (38.9 C), not controlled by medicine.  Chest, back, or muscle pain.  People around you feel you are not acting correctly or are confused.  Shortness of breath or difficulty breathing.  Dizziness and fainting.  You get a rash or develop hives.  You have a decrease in urine output.  Your urine turns a dark color or changes to pink, red, or brown. Any of the following symptoms occur over the next 10 days:  You have a temperature by mouth above 102 F (38.9 C), not controlled by medicine.  Shortness of breath.  Weakness after normal activity.  The white part of the eye turns yellow (jaundice).  You have a decrease in the amount of urine or are urinating less often.  Your urine turns a dark color or changes to pink, red, or brown. Document Released: 08/05/2000 Document Revised: 10/31/2011 Document Reviewed: 03/24/2008 [facility] Patient Information 2014 [place], [place].  _______________________________________________________________________

## 2016-03-29 NOTE — Pre-Procedure Instructions (Addendum)
Pt PCR screen positive for Staph.  Called in prescription, spoke with pt and she will used prescript as instructed.  Notified Dr. Lequita HaltAluisio. Put comment in "special needs" section of OR chart.  Pt T&S positive for ABS. Ordered T&S for morning of surgery.

## 2016-03-31 DIAGNOSIS — I5032 Chronic diastolic (congestive) heart failure: Secondary | ICD-10-CM | POA: Diagnosis not present

## 2016-03-31 DIAGNOSIS — F17211 Nicotine dependence, cigarettes, in remission: Secondary | ICD-10-CM | POA: Diagnosis not present

## 2016-03-31 DIAGNOSIS — F3340 Major depressive disorder, recurrent, in remission, unspecified: Secondary | ICD-10-CM | POA: Diagnosis not present

## 2016-03-31 DIAGNOSIS — E039 Hypothyroidism, unspecified: Secondary | ICD-10-CM | POA: Diagnosis not present

## 2016-03-31 DIAGNOSIS — I129 Hypertensive chronic kidney disease with stage 1 through stage 4 chronic kidney disease, or unspecified chronic kidney disease: Secondary | ICD-10-CM | POA: Diagnosis not present

## 2016-03-31 DIAGNOSIS — Z01818 Encounter for other preprocedural examination: Secondary | ICD-10-CM | POA: Diagnosis not present

## 2016-04-03 ENCOUNTER — Ambulatory Visit: Payer: Self-pay | Admitting: Orthopedic Surgery

## 2016-04-03 NOTE — H&P (Signed)
[name] DOB: Jun 03, 1951 Married / Language: English / Race: White Female Date of Admission:  04/04/2016 CC:  Bilateral Knee Pain History of Present Illness  The patient is a 65 year old female who comes in for a preoperative History and Physical. The patient is scheduled for a left total knee arthroplasty to be performed by Dr. Gus Rankin. Aluisio, MD at [facility] on 04/04/2016. The patient is a 65 year old female who presented for follow up of their knee. The patient is being followed for their bilateral knee pain and osteoarthritis. They are now 4 month(s) out from cortisone injections. The following medication has been used for pain control: Tylenol. Note for "Follow-up Knee": Patient states that the last injection did not seem to work as quickly. She said that she was doing more during that time. Her left knee is popping and waking her at night. The right knee hurts, but not as badly as the left. Unfortunately, her left greater than right knee is getting progressively worse. She has got a valgus deformity and deformity is worsening. The knee is starting to feel unstable to her. The injections helped but for only short amount of time and they do not help with the stability or help with her function. They do help with pain, but for a short amount of time. Right knee is tolerable, but the left one is really getting a lot worse. She is ready to proceedw ith surgery on the left knee. She is also requesting a right knee cortisone injection. They have been treated conservatively in the past for the above stated problem and despite conservative measures, they continue to have progressive pain and severe functional limitations and dysfunction. They have failed non-operative management including home exercise, medications, and injections. It is felt that they would benefit from undergoing total joint replacement. Risks and benefits of the procedure have been discussed with the patient and they  elect to proceed with surgery. There are no active contraindications to surgery such as ongoing infection or rapidly progressive neurological disease.  Allergies Augmentin *PENICILLINS*  Hands itching and break out  Past Medical History High blood pressure  Congestive Heart Failure  Heart murmur  Hypothyroidism  Osteoarthritis  Primary osteoarthritis of left knee (M17.12)  Menopause  Family History Heart Disease  Father, Paternal Grandfather. Osteoarthritis  Mother. Rheumatoid Arthritis  Mother.  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 04/22/2015: smoke(d) 1 pack(s) per day Advance Directives  Living Will, Healthcare POA  Medication History Metoprolol Tartrate (  Tablet, Oral two times daily) Active. Levothyroxine Sodium ( Tablet, Oral) Active. Furosemide (  Tablet, Oral) Active. Vitamin D (Oral) Specific strength unknown - Active.  Past Surgical History Dilation and Curettage of Uterus - Multiple  1994, 09/2009 Gallbladder Surgery  Date: 2005. laporoscopic Hernia Repair  Date: 09/2015. Umbilical Hernia  Review of Systems General Not Present- Chills, Fatigue, Fever, Memory Loss, Night Sweats, Weight Gain and Weight Loss. Skin Not Present- Eczema, Hives, Itching, Lesions and Rash. HEENT Not Present- Dentures, Double Vision, Headache, Hearing Loss, Tinnitus and Visual Loss. Respiratory Not Present- Allergies, Chronic Cough, Coughing up blood, Shortness of breath  at rest and Shortness of breath with exertion. Cardiovascular Not Present- Chest Pain, Difficulty Breathing Lying Down,  Murmur, Palpitations, Racing/skipping heartbeats and Swelling. Gastrointestinal Not Present- Abdominal Pain, Bloody Stool, Constipation, Diarrhea, Difficulty Swallowing, Heartburn, Jaundice, Loss of appetitie, Nausea and Vomiting. Female Genitourinary Not Present- Blood in Urine, Discharge, Flank Pain, Incontinence, Painful Urination, Urgency, Urinary frequency, Urinary Retention, Urinating at Night and Weak urinary stream. Musculoskeletal Present- Joint Pain, Joint Swelling and Morning Stiffness. Not Present- Back Pain, Muscle Pain, Muscle Weakness and Spasms. Neurological Not Present- Blackout spells, Difficulty with balance, Dizziness, Paralysis, Tremor and Weakness. Psychiatric Not Present- Insomnia.  Vitals  Weight: 223 lb Height: 67in Body Surface Area: 2.12 m Body Mass Index: 34.93 kg/m  Pulse: 56 (Regular)  BP: 142/68 (Sitting, Right Arm, Standard)   Physical Exam General Mental Status -Alert, cooperative and good historian. General Appearance-pleasant, Not in acute distress. Orientation-Oriented X3. Build & Nutrition-Well nourished and Well developed.  Head and Neck Head-normocephalic, atraumatic . Neck Global Assessment - supple, no bruit auscultated on the right, no bruit auscultated on the left.  Eye Vision-Wears corrective lenses. Pupil - Bilateral-Regular and Round. Motion - Bilateral-EOMI.  Chest and Lung Exam Auscultation Breath sounds - clear at anterior chest wall and clear at posterior chest wall. Adventitious sounds - No Adventitious sounds.  Cardiovascular Auscultation Rhythm - Regular rate and rhythm. Heart Sounds - S1 WNL and S2 WNL. Murmurs & Other Heart Sounds - Auscultation of the heart reveals - No Murmurs.  Abdomen Palpation/Percussion Tenderness - Abdomen is non-tender to palpation. Rigidity (guarding) - Abdomen is soft. Auscultation Auscultation of the abdomen reveals - Bowel sounds normal.  Female  Genitourinary Note: Not done, not pertinent to present illness   Musculoskeletal Note: On exam, she is alert and oriented, in no apparent distress. Her left knee shows a significant valgus deformity. Her range of motion is about 5 to 125 degrees. There is some pseudolaxity but no true instability. Right knee shows slight varus range 5 to 125. Some tenderness medial greater than lateral. No instability.  Radiographs are reviewed, AP both knees and lateral. She has got significant bone-on-bone change in the lateral and patellofemoral compartments of the left knee and has bone-on-bone in the medial and patellofemoral of the right. The left looks worse overall.   Assessment & Plan  Primary osteoarthritis of right knee (M17.11) Primary osteoarthritis of left knee (M17.12)  Note:Surgical Plans: Left Total Knee Replacement and a Right Knee Cortisone Injection  Disposition: Home with husband  PCP: Dr. Shary DecampBrian McKenzie  IV TXA  Anesthesia Issues: None  [facility] Study Patient VIRTUAL THERAPY  Signed electronically by Beckey RutterAlezandrew L Perkins, III PA-C

## 2016-04-04 ENCOUNTER — Inpatient Hospital Stay (HOSPITAL_COMMUNITY): Payer: Medicare Other | Admitting: Anesthesiology

## 2016-04-04 ENCOUNTER — Encounter (HOSPITAL_COMMUNITY): Payer: Self-pay | Admitting: *Deleted

## 2016-04-04 ENCOUNTER — Inpatient Hospital Stay (HOSPITAL_COMMUNITY)
Admission: RE | Admit: 2016-04-04 | Discharge: 2016-04-06 | DRG: 470 | Disposition: A | Discharge status: Home / Self Care | Payer: Medicare Other | Source: Ambulatory Visit | Attending: Orthopedic Surgery | Admitting: Orthopedic Surgery

## 2016-04-04 ENCOUNTER — Encounter (HOSPITAL_COMMUNITY)
Admission: RE | Disposition: A | Discharge status: Home / Self Care | Payer: Self-pay | Source: Ambulatory Visit | Attending: Orthopedic Surgery

## 2016-04-04 DIAGNOSIS — I110 Hypertensive heart disease with heart failure: Secondary | ICD-10-CM | POA: Diagnosis present

## 2016-04-04 DIAGNOSIS — M1710 Unilateral primary osteoarthritis, unspecified knee: Secondary | ICD-10-CM | POA: Diagnosis present

## 2016-04-04 DIAGNOSIS — I509 Heart failure, unspecified: Secondary | ICD-10-CM | POA: Diagnosis present

## 2016-04-04 DIAGNOSIS — E039 Hypothyroidism, unspecified: Secondary | ICD-10-CM | POA: Diagnosis present

## 2016-04-04 DIAGNOSIS — M1712 Unilateral primary osteoarthritis, left knee: Secondary | ICD-10-CM | POA: Diagnosis not present

## 2016-04-04 DIAGNOSIS — M179 Osteoarthritis of knee, unspecified: Secondary | ICD-10-CM | POA: Diagnosis present

## 2016-04-04 DIAGNOSIS — Z87891 Personal history of nicotine dependence: Secondary | ICD-10-CM

## 2016-04-04 DIAGNOSIS — I10 Essential (primary) hypertension: Secondary | ICD-10-CM | POA: Diagnosis not present

## 2016-04-04 DIAGNOSIS — Z8261 Family history of arthritis: Secondary | ICD-10-CM | POA: Diagnosis not present

## 2016-04-04 DIAGNOSIS — M170 Bilateral primary osteoarthritis of knee: Principal | ICD-10-CM | POA: Diagnosis present

## 2016-04-04 DIAGNOSIS — M21062 Valgus deformity, not elsewhere classified, left knee: Secondary | ICD-10-CM | POA: Diagnosis present

## 2016-04-04 DIAGNOSIS — M21061 Valgus deformity, not elsewhere classified, right knee: Secondary | ICD-10-CM | POA: Diagnosis present

## 2016-04-04 DIAGNOSIS — M25562 Pain in left knee: Secondary | ICD-10-CM | POA: Diagnosis not present

## 2016-04-04 HISTORY — DX: Osteoarthritis of knee, unspecified: M17.9

## 2016-04-04 HISTORY — PX: TOTAL KNEE ARTHROPLASTY: SHX125

## 2016-04-04 SURGERY — ARTHROPLASTY, KNEE, TOTAL
Anesthesia: Spinal | Site: Knee | Laterality: Left

## 2016-04-04 MED ORDER — ONDANSETRON HCL 4 MG/2ML IJ SOLN
INTRAMUSCULAR | Status: AC
  Filled 2016-04-04: qty 2

## 2016-04-04 MED ORDER — MIDAZOLAM HCL 5 MG/5ML IJ SOLN
INTRAMUSCULAR | Status: DC | PRN
  Administered 2016-04-04: 2 mg via INTRAVENOUS

## 2016-04-04 MED ORDER — METOCLOPRAMIDE HCL 5 MG PO TABS: 5.0000 mg | ORAL_TABLET | Freq: Three times a day (TID) | ORAL | Status: DC | PRN

## 2016-04-04 MED ORDER — RIVAROXABAN 10 MG PO TABS
10.0000 mg | ORAL_TABLET | Freq: Every day | ORAL | Status: DC
  Administered 2016-04-05 – 2016-04-06 (×2): 10 mg via ORAL
  Filled 2016-04-04 (×2): qty 1

## 2016-04-04 MED ORDER — PHENYLEPHRINE HCL 10 MG/ML IJ SOLN
INTRAMUSCULAR | Status: AC
  Filled 2016-04-04: qty 1

## 2016-04-04 MED ORDER — PHENYLEPHRINE HCL 10 MG/ML IJ SOLN
INTRAVENOUS | Status: DC | PRN
  Administered 2016-04-04: 40 ug/min via INTRAVENOUS

## 2016-04-04 MED ORDER — MORPHINE SULFATE (PF) 2 MG/ML IV SOLN
1.0000 mg | INTRAVENOUS | Status: DC | PRN
  Administered 2016-04-04: 1 mg via INTRAVENOUS
  Filled 2016-04-04: qty 1

## 2016-04-04 MED ORDER — ACETAMINOPHEN 650 MG RE SUPP: 650.0000 mg | Freq: Four times a day (QID) | RECTAL | Status: DC | PRN

## 2016-04-04 MED ORDER — FENTANYL CITRATE (PF) 100 MCG/2ML IJ SOLN
INTRAMUSCULAR | Status: DC | PRN
  Administered 2016-04-04 (×2): 50 ug via INTRAVENOUS

## 2016-04-04 MED ORDER — SODIUM CHLORIDE 0.9 % IR SOLN
Status: DC | PRN
  Administered 2016-04-04: 1000 mL

## 2016-04-04 MED ORDER — HYDROMORPHONE HCL 1 MG/ML IJ SOLN: 0.2500 mg | INTRAMUSCULAR | Status: DC | PRN

## 2016-04-04 MED ORDER — VANCOMYCIN HCL IN DEXTROSE 1-5 GM/200ML-% IV SOLN: 1000.0000 mg | INTRAVENOUS | Status: DC

## 2016-04-04 MED ORDER — CHLORHEXIDINE GLUCONATE 4 % EX LIQD
60.0000 mL | Freq: Once | CUTANEOUS | Status: DC
Start: 2016-04-04 — End: 2016-04-04

## 2016-04-04 MED ORDER — MEPERIDINE HCL 50 MG/ML IJ SOLN
6.2500 mg | INTRAMUSCULAR | Status: DC | PRN
  Administered 2016-04-04 (×2): 6.25 mg via INTRAVENOUS

## 2016-04-04 MED ORDER — PHENOL 1.4 % MT LIQD
1.0000 | OROMUCOSAL | Status: DC | PRN
Start: 2016-04-04 — End: 2016-04-06

## 2016-04-04 MED ORDER — BUPIVACAINE LIPOSOME 1.3 % IJ SUSP
INTRAMUSCULAR | Status: DC | PRN
  Administered 2016-04-04: 50 mL

## 2016-04-04 MED ORDER — BUPIVACAINE IN DEXTROSE 0.75-8.25 % IT SOLN
INTRATHECAL | Status: DC | PRN
  Administered 2016-04-04: 1.8 mL via INTRATHECAL

## 2016-04-04 MED ORDER — HYDROMORPHONE HCL 1 MG/ML IJ SOLN
0.2500 mg | INTRAMUSCULAR | Status: DC | PRN
  Administered 2016-04-04: 0.5 mg via INTRAVENOUS

## 2016-04-04 MED ORDER — METOPROLOL TARTRATE 50 MG PO TABS
100.0000 mg | ORAL_TABLET | Freq: Two times a day (BID) | ORAL | Status: DC
  Administered 2016-04-04 – 2016-04-06 (×4): 100 mg via ORAL
  Filled 2016-04-04 (×5): qty 2

## 2016-04-04 MED ORDER — BISACODYL 10 MG RE SUPP: 10.0000 mg | Freq: Every day | RECTAL | Status: DC | PRN

## 2016-04-04 MED ORDER — SODIUM CHLORIDE 0.9 % IJ SOLN
INTRAMUSCULAR | Status: DC | PRN
  Administered 2016-04-04: 30 mL

## 2016-04-04 MED ORDER — METHOCARBAMOL 1000 MG/10ML IJ SOLN
500.0000 mg | Freq: Four times a day (QID) | INTRAVENOUS | Status: DC | PRN
  Administered 2016-04-04: 500 mg via INTRAVENOUS
  Filled 2016-04-04: qty 550
  Filled 2016-04-04: qty 5

## 2016-04-04 MED ORDER — BUPIVACAINE LIPOSOME 1.3 % IJ SUSP
20.0000 mL | Freq: Once | INTRAMUSCULAR | Status: DC
  Filled 2016-04-04: qty 20

## 2016-04-04 MED ORDER — DOCUSATE SODIUM 100 MG PO CAPS
100.0000 mg | ORAL_CAPSULE | Freq: Two times a day (BID) | ORAL | Status: DC
  Administered 2016-04-04 – 2016-04-05 (×3): 100 mg via ORAL
  Filled 2016-04-04 (×4): qty 1

## 2016-04-04 MED ORDER — VANCOMYCIN HCL IN DEXTROSE 1-5 GM/200ML-% IV SOLN
1000.0000 mg | Freq: Two times a day (BID) | INTRAVENOUS | Status: AC
  Administered 2016-04-04: 1000 mg via INTRAVENOUS
  Filled 2016-04-04: qty 200

## 2016-04-04 MED ORDER — DEXAMETHASONE SODIUM PHOSPHATE 10 MG/ML IJ SOLN
INTRAMUSCULAR | Status: AC
  Filled 2016-04-04: qty 1

## 2016-04-04 MED ORDER — TRANEXAMIC ACID 1000 MG/10ML IV SOLN
1000.0000 mg | INTRAVENOUS | Status: AC
  Administered 2016-04-04: 1000 mg via INTRAVENOUS
  Filled 2016-04-04: qty 1100

## 2016-04-04 MED ORDER — ONDANSETRON HCL 4 MG PO TABS: 4.0000 mg | ORAL_TABLET | Freq: Four times a day (QID) | ORAL | Status: DC | PRN

## 2016-04-04 MED ORDER — ONDANSETRON HCL 4 MG/2ML IJ SOLN
INTRAMUSCULAR | Status: DC | PRN
Start: 2016-04-04 — End: 2016-04-04
  Administered 2016-04-04: 4 mg via INTRAVENOUS

## 2016-04-04 MED ORDER — FUROSEMIDE 40 MG PO TABS
40.0000 mg | ORAL_TABLET | Freq: Every day | ORAL | Status: DC
  Filled 2016-04-04: qty 1

## 2016-04-04 MED ORDER — FENTANYL CITRATE (PF) 100 MCG/2ML IJ SOLN
INTRAMUSCULAR | Status: AC
  Filled 2016-04-04: qty 2

## 2016-04-04 MED ORDER — ACETAMINOPHEN 10 MG/ML IV SOLN
1000.0000 mg | Freq: Once | INTRAVENOUS | Status: AC
  Administered 2016-04-04: 1000 mg via INTRAVENOUS
  Filled 2016-04-04: qty 100

## 2016-04-04 MED ORDER — ACETAMINOPHEN 500 MG PO TABS
1000.0000 mg | ORAL_TABLET | Freq: Four times a day (QID) | ORAL | Status: AC
  Administered 2016-04-04 – 2016-04-05 (×4): 1000 mg via ORAL
  Filled 2016-04-04 (×4): qty 2

## 2016-04-04 MED ORDER — LEVOTHYROXINE SODIUM 75 MCG PO TABS
75.0000 ug | ORAL_TABLET | Freq: Every day | ORAL | Status: DC
  Administered 2016-04-05 – 2016-04-06 (×2): 75 ug via ORAL
  Filled 2016-04-04 (×2): qty 1

## 2016-04-04 MED ORDER — DEXAMETHASONE SODIUM PHOSPHATE 10 MG/ML IJ SOLN
10.0000 mg | Freq: Once | INTRAMUSCULAR | Status: AC
  Administered 2016-04-04: 10 mg via INTRAVENOUS

## 2016-04-04 MED ORDER — FLEET ENEMA 7-19 GM/118ML RE ENEM: 1.0000 | ENEMA | Freq: Once | RECTAL | Status: DC | PRN

## 2016-04-04 MED ORDER — TRANEXAMIC ACID 1000 MG/10ML IV SOLN
1000.0000 mg | Freq: Once | INTRAVENOUS | Status: AC
  Administered 2016-04-04: 1000 mg via INTRAVENOUS
  Filled 2016-04-04: qty 1100

## 2016-04-04 MED ORDER — VANCOMYCIN HCL 10 G IV SOLR
1500.0000 mg | INTRAVENOUS | Status: AC
  Administered 2016-04-04: 1500 mg via INTRAVENOUS
  Filled 2016-04-04: qty 1500

## 2016-04-04 MED ORDER — LACTATED RINGERS IV SOLN
INTRAVENOUS | Status: DC
  Administered 2016-04-04 (×2): via INTRAVENOUS

## 2016-04-04 MED ORDER — PROPOFOL 10 MG/ML IV BOLUS
INTRAVENOUS | Status: DC | PRN
  Administered 2016-04-04: 30 mg via INTRAVENOUS

## 2016-04-04 MED ORDER — MIDAZOLAM HCL 2 MG/2ML IJ SOLN
INTRAMUSCULAR | Status: AC
  Filled 2016-04-04: qty 2

## 2016-04-04 MED ORDER — SODIUM CHLORIDE 0.9 % IV SOLN
INTRAVENOUS | Status: DC
  Administered 2016-04-04 – 2016-04-05 (×2): via INTRAVENOUS

## 2016-04-04 MED ORDER — METHOCARBAMOL 500 MG PO TABS
500.0000 mg | ORAL_TABLET | Freq: Four times a day (QID) | ORAL | Status: DC | PRN
  Administered 2016-04-04 – 2016-04-05 (×2): 500 mg via ORAL
  Filled 2016-04-04 (×2): qty 1

## 2016-04-04 MED ORDER — ONDANSETRON HCL 4 MG/2ML IJ SOLN: 4.0000 mg | Freq: Four times a day (QID) | INTRAMUSCULAR | Status: DC | PRN

## 2016-04-04 MED ORDER — OXYCODONE HCL 5 MG PO TABS
5.0000 mg | ORAL_TABLET | ORAL | Status: DC | PRN
  Administered 2016-04-04 (×2): 5 mg via ORAL
  Administered 2016-04-04 – 2016-04-06 (×10): 10 mg via ORAL
  Filled 2016-04-04 (×2): qty 2
  Filled 2016-04-04: qty 1
  Filled 2016-04-04 (×7): qty 2
  Filled 2016-04-04: qty 1
  Filled 2016-04-04: qty 2

## 2016-04-04 MED ORDER — POLYETHYLENE GLYCOL 3350 17 G PO PACK: 17.0000 g | PACK | Freq: Every day | ORAL | Status: DC | PRN

## 2016-04-04 MED ORDER — ACETAMINOPHEN 10 MG/ML IV SOLN
INTRAVENOUS | Status: AC
  Filled 2016-04-04: qty 100

## 2016-04-04 MED ORDER — ACETAMINOPHEN 325 MG PO TABS: 650.0000 mg | ORAL_TABLET | Freq: Four times a day (QID) | ORAL | Status: DC | PRN

## 2016-04-04 MED ORDER — BUPIVACAINE HCL 0.25 % IJ SOLN
INTRAMUSCULAR | Status: DC | PRN
  Administered 2016-04-04: 30 mL

## 2016-04-04 MED ORDER — DEXAMETHASONE SODIUM PHOSPHATE 10 MG/ML IJ SOLN
10.0000 mg | Freq: Once | INTRAMUSCULAR | Status: AC
  Administered 2016-04-05: 10 mg via INTRAVENOUS
  Filled 2016-04-04: qty 1

## 2016-04-04 MED ORDER — METOCLOPRAMIDE HCL 5 MG/ML IJ SOLN: 5.0000 mg | Freq: Three times a day (TID) | INTRAMUSCULAR | Status: DC | PRN

## 2016-04-04 MED ORDER — SODIUM CHLORIDE 0.9 % IJ SOLN
INTRAMUSCULAR | Status: AC
  Filled 2016-04-04: qty 50

## 2016-04-04 MED ORDER — PROPOFOL 10 MG/ML IV BOLUS
INTRAVENOUS | Status: AC
  Filled 2016-04-04: qty 20

## 2016-04-04 MED ORDER — PROPOFOL 10 MG/ML IV BOLUS
INTRAVENOUS | Status: AC
  Filled 2016-04-04: qty 60

## 2016-04-04 MED ORDER — PROPOFOL 500 MG/50ML IV EMUL
INTRAVENOUS | Status: DC | PRN
  Administered 2016-04-04: 75 ug/kg/min via INTRAVENOUS

## 2016-04-04 MED ORDER — DIPHENHYDRAMINE HCL 12.5 MG/5ML PO ELIX: 12.5000 mg | ORAL_SOLUTION | ORAL | Status: DC | PRN

## 2016-04-04 MED ORDER — MENTHOL 3 MG MT LOZG: 1.0000 | LOZENGE | OROMUCOSAL | Status: DC | PRN

## 2016-04-04 MED ORDER — HYDROMORPHONE HCL 1 MG/ML IJ SOLN
INTRAMUSCULAR | Status: AC
  Filled 2016-04-04: qty 1

## 2016-04-04 MED ORDER — MEPERIDINE HCL 50 MG/ML IJ SOLN
INTRAMUSCULAR | Status: AC
  Filled 2016-04-04: qty 1

## 2016-04-04 MED ORDER — TRAMADOL HCL 50 MG PO TABS: 50.0000 mg | ORAL_TABLET | Freq: Four times a day (QID) | ORAL | Status: DC | PRN

## 2016-04-04 MED ORDER — BUPIVACAINE HCL (PF) 0.25 % IJ SOLN
INTRAMUSCULAR | Status: AC
  Filled 2016-04-04: qty 30

## 2016-04-04 SURGICAL SUPPLY — 49 items
BAG DECANTER FOR FLEXI CONT (MISCELLANEOUS) IMPLANT
BAG ZIPLOCK 12X15 (MISCELLANEOUS) IMPLANT
BANDAGE ACE 6X5 VEL STRL LF (GAUZE/BANDAGES/DRESSINGS) ×2 IMPLANT
BLADE SAG 18X100X1.27 (BLADE) ×2 IMPLANT
BLADE SAW SGTL 11.0X1.19X90.0M (BLADE) ×2 IMPLANT
BOWL SMART MIX CTS (DISPOSABLE) ×2 IMPLANT
CAP KNEE TOTAL 3 SIGMA ×2 IMPLANT
CEMENT HV SMART SET (Cement) ×4 IMPLANT
CLOTH BEACON ORANGE TIMEOUT ST (SAFETY) ×2 IMPLANT
CUFF TOURN SGL QUICK 34 (TOURNIQUET CUFF) ×1
CUFF TRNQT CYL 34X4X40X1 (TOURNIQUET CUFF) ×1 IMPLANT
DECANTER SPIKE VIAL GLASS SM (MISCELLANEOUS) ×2 IMPLANT
DRAPE U-SHAPE 47X51 STRL (DRAPES) ×2 IMPLANT
DRSG ADAPTIC 3X8 NADH LF (GAUZE/BANDAGES/DRESSINGS) ×2 IMPLANT
DRSG PAD ABDOMINAL 8X10 ST (GAUZE/BANDAGES/DRESSINGS) ×2 IMPLANT
DURAPREP 26ML APPLICATOR (WOUND CARE) ×2 IMPLANT
ELECT REM PT RETURN 9FT ADLT (ELECTROSURGICAL) ×2
ELECTRODE REM PT RTRN 9FT ADLT (ELECTROSURGICAL) ×1 IMPLANT
EVACUATOR 1/8 PVC DRAIN (DRAIN) ×2 IMPLANT
GAUZE SPONGE 4X4 12PLY STRL (GAUZE/BANDAGES/DRESSINGS) ×2 IMPLANT
GLOVE BIO SURGEON STRL SZ7.5 (GLOVE) ×6 IMPLANT
GLOVE BIO SURGEON STRL SZ8 (GLOVE) ×2 IMPLANT
GLOVE BIOGEL PI IND STRL 6.5 (GLOVE) IMPLANT
GLOVE BIOGEL PI IND STRL 8 (GLOVE) ×1 IMPLANT
GLOVE BIOGEL PI INDICATOR 6.5 (GLOVE)
GLOVE BIOGEL PI INDICATOR 8 (GLOVE) ×1
GLOVE SURG SS PI 6.5 STRL IVOR (GLOVE) IMPLANT
GOWN STRL REUS W/TWL LRG LVL3 (GOWN DISPOSABLE) ×2 IMPLANT
GOWN STRL REUS W/TWL XL LVL3 (GOWN DISPOSABLE) ×6 IMPLANT
HANDPIECE INTERPULSE COAX TIP (DISPOSABLE) ×1
IMMOBILIZER KNEE 20 (SOFTGOODS) ×2
IMMOBILIZER KNEE 20 THIGH 36 (SOFTGOODS) ×1 IMPLANT
MANIFOLD NEPTUNE II (INSTRUMENTS) ×2 IMPLANT
NS IRRIG 1000ML POUR BTL (IV SOLUTION) ×2 IMPLANT
PACK TOTAL KNEE CUSTOM (KITS) ×2 IMPLANT
PADDING CAST COTTON 6X4 STRL (CAST SUPPLIES) ×6 IMPLANT
POSITIONER SURGICAL ARM (MISCELLANEOUS) ×2 IMPLANT
SET HNDPC FAN SPRY TIP SCT (DISPOSABLE) ×1 IMPLANT
STRIP CLOSURE SKIN 1/2X4 (GAUZE/BANDAGES/DRESSINGS) ×4 IMPLANT
SUT MNCRL AB 4-0 PS2 18 (SUTURE) ×2 IMPLANT
SUT VIC AB 2-0 CT1 27 (SUTURE) ×4
SUT VIC AB 2-0 CT1 TAPERPNT 27 (SUTURE) ×4 IMPLANT
SUT VLOC 180 0 24IN GS25 (SUTURE) ×2 IMPLANT
SYR 50ML LL SCALE MARK (SYRINGE) ×2 IMPLANT
TRAY FOLEY W/METER SILVER 14FR (SET/KITS/TRAYS/PACK) ×2 IMPLANT
TRAY FOLEY W/METER SILVER 16FR (SET/KITS/TRAYS/PACK) IMPLANT
WATER STERILE IRR 1500ML POUR (IV SOLUTION) ×2 IMPLANT
WRAP KNEE MAXI GEL POST OP (GAUZE/BANDAGES/DRESSINGS) ×2 IMPLANT
YANKAUER SUCT BULB TIP 10FT TU (MISCELLANEOUS) ×2 IMPLANT

## 2016-04-04 NOTE — Evaluation (Signed)
Physical Therapy Evaluation Patient Details Name: Mariel SleetGwendolyn J Ekblad MRN: [id number] DOB: 12/05/1950 Today's Date: 04/04/2016   History of Present Illness  Pt is a 65 year old female s/p L TKA  Clinical Impression  Pt is s/p L TKA resulting in the deficits listed below (see PT Problem List). Pt will benefit from skilled PT to increase their independence and safety with mobility to allow discharge to the venue listed below.   Pt able to tolerate short distance ambulation POD #0 and plans to d/c home with spouse and son to assist.  Pt plans to have virtual home PT upon d/c.     Follow Up Recommendations Home health PT (plans for virtual home therapy)    Equipment Recommendations  None recommended by PT    Recommendations for Other Services       Precautions / Restrictions Precautions Precautions: Fall;Knee Required Braces or Orthoses: Knee Immobilizer - Left Restrictions Other Position/Activity Restrictions: WBAT      Mobility  Bed Mobility Overal bed mobility: Needs Assistance Bed Mobility: Supine to Sit     Supine to sit: Min assist     General bed mobility comments: verbal cues for technique, assist for L LE  Transfers Overall transfer level: Needs assistance Equipment used: Rolling walker (2 wheeled) Transfers: Sit to/from Stand Sit to Stand: Min guard         General transfer comment: verbal cues for UE and LE positioning  Ambulation/Gait Ambulation/Gait assistance: Min guard Ambulation Distance (Feet): 60 Feet Assistive device: Rolling walker (2 wheeled) Gait Pattern/deviations: Step-to pattern;Decreased stance time - left;Antalgic;Trunk flexed     General Gait Details: verbal cues for sequence, RW positioning, posture, step length  Stairs            Wheelchair Mobility    Modified Rankin (Stroke Patients Only)       Balance                                             Pertinent Vitals/Pain Pain Assessment: 0-10 Pain  Score: 3  Pain Location: L knee Pain Descriptors / Indicators: Aching;Sore Pain Intervention(s): Monitored during session;Limited activity within patient's tolerance;Repositioned;Ice applied    Home Living Family/patient expects to be discharged to:: Private residence Living Arrangements: Spouse/significant other   Type of Home: House Home Access: Ramped entrance     Home Layout: One level Home Equipment: Environmental consultantWalker - 2 wheels;Toilet riser      Prior Function Level of Independence: Independent with assistive device(s)               Hand Dominance        Extremity/Trunk Assessment               Lower Extremity Assessment: LLE deficits/detail   LLE Deficits / Details: approx 35* of AAROM knee flexion supine limited by pain, good quad contraction     Communication   Communication: No difficulties  Cognition Arousal/Alertness: Awake/alert Behavior During Therapy: WFL for tasks assessed/performed Overall Cognitive Status: Within Functional Limits for tasks assessed                      General Comments      Exercises        Assessment/Plan    PT Assessment    PT Diagnosis Difficulty walking;Acute pain   PT Problem List    PT  Treatment Interventions     PT Goals (Current goals can be found in the Care Plan section) Acute Rehab PT Goals PT Goal Formulation: With patient Time For Goal Achievement: 04/07/16 Potential to Achieve Goals: Good    Frequency     Barriers to discharge        Co-evaluation               End of Session Equipment Utilized During Treatment: Gait belt;Left knee immobilizer Activity Tolerance: Patient tolerated treatment well Patient left: in chair;with call bell/phone within reach;with chair alarm set           Time: [id number] PT Time Calculation (min) (ACUTE ONLY): 19 min   Charges:   PT Evaluation $PT Eval Low Complexity: 1 Procedure     PT G Codes:        Hadrian Yarbrough,KATHrine E 04/04/2016, 4:05  PM Zenovia JarredKati Norman Piacentini, PT, DPT 04/04/2016 Pager: [phone]

## 2016-04-04 NOTE — Anesthesia Postprocedure Evaluation (Signed)
Anesthesia Post Note  Patient: [name]  Procedure(s) Performed: Procedure(s) (LRB): LEFT TOTAL KNEE ARTHROPLASTY (Left)  Patient location during evaluation: PACU Level of consciousness: awake Pain management: pain level controlled Vital Signs Assessment: post-procedure vital signs reviewed and stable Respiratory status: spontaneous breathing Cardiovascular status: stable Anesthetic complications: no    Last Vitals:  Vitals:   04/04/16 0602 04/04/16 1015  BP: (!) 145/80 (!) 113/57  Pulse: (!) 58 (!) 48  Resp: 16 14  Temp: 36.7 C 36.3 C    Last Pain:  Vitals:   04/04/16 1045  TempSrc:   PainSc: 0-No pain    LLE Motor Response: Purposeful movement (04/04/16 1045) LLE Sensation: Tingling (04/04/16 1045) RLE Motor Response: Purposeful movement (04/04/16 1045) RLE Sensation: Tingling (04/04/16 1045) L Sensory Level: S1-Sole of foot, small toes (04/04/16 1045) R Sensory Level: S1-Sole of foot, small toes (04/04/16 1045)  EDWARDS,Keelee Yankey

## 2016-04-04 NOTE — Anesthesia Procedure Notes (Deleted)
Spinal

## 2016-04-04 NOTE — Op Note (Signed)
OPERATIVE REPORT-TOTAL KNEE ARTHROPLASTY   Pre-operative diagnosis- Osteoarthritis  Left knee(s)  Post-operative diagnosis- Osteoarthritis Left knee(s)  Procedure-  Left  Total Knee Arthroplasty  Surgeon- Christine RankinFrank V. Kaveh Kissinger, MD  Assistant- Avel Peacerew Perkins, PA-C   Anesthesia-  Spinal  EBL-* No blood loss amount entered *   Drains Hemovac  Tourniquet time- 32 minutes @ 300 mm Hg   Complications- None  Condition-PACU - hemodynamically stable.   Brief Clinical Note  [name] is a 65 y.o. year old female with end stage OA of her left knee with progressively worsening pain and dysfunction. She has constant pain, with activity and at rest and significant functional deficits with difficulties even with ADLs. She has had extensive non-op management including analgesics, injections of cortisone and viscosupplements, and home exercise program, but remains in significant pain with significant dysfunction. Radiographs show bone on bone arthritis all 3 compartments. She presents now for left Total Knee Arthroplasty.    Procedure in detail---   The patient is brought into the operating room and positioned supine on the operating table. After successful administration of  Spinal,   a tourniquet is placed high on the  Left thigh(s) and the lower extremity is prepped and draped in the usual sterile fashion. Time out is performed by the operating team and then the  Left lower extremity is wrapped in Esmarch, knee flexed and the tourniquet inflated to 300 mmHg.       A midline incision is made with a ten blade through the subcutaneous tissue to the level of the extensor mechanism. A fresh blade is used to make a medial parapatellar arthrotomy. Soft tissue over the proximal medial tibia is subperiosteally elevated to the joint line with a knife and into the semimembranosus bursa with a Cobb elevator. Soft tissue over the proximal lateral tibia is elevated with attention being paid to avoiding the  patellar tendon on the tibial tubercle. The patella is everted, knee flexed 90 degrees and the ACL and PCL are removed. Findings are bone on bone all 3 compartments with massive global osteophytes.        The drill is used to create a starting hole in the distal femur and the canal is thoroughly irrigated with sterile saline to remove the fatty contents. The 5 degree Left  valgus alignment guide is placed into the femoral canal and the distal femoral cutting block is pinned to remove 10 mm off the distal femur. Resection is made with an oscillating saw.      The tibia is subluxed forward and the menisci are removed. The extramedullary alignment guide is placed referencing proximally at the medial aspect of the tibial tubercle and distally along the second metatarsal axis and tibial crest. The block is pinned to remove 2mm off the more deficient lateral  side. Resection is made with an oscillating saw. Size 3is the most appropriate size for the tibia and the proximal tibia is prepared with the modular drill and keel punch for that size.      The femoral sizing guide is placed and size 3 is most appropriate. Rotation is marked off the epicondylar axis and confirmed by creating a rectangular flexion gap at 90 degrees. The size 3 cutting block is pinned in this rotation and the anterior, posterior and chamfer cuts are made with the oscillating saw. The intercondylar block is then placed and that cut is made.      Trial size 3 tibial component, trial size 3 posterior stabilized femur  and a 12.5  mm posterior stabilized rotating platform insert trial is placed. Full extension is achieved with excellent varus/valgus and anterior/posterior balance throughout full range of motion. The patella is everted and thickness measured to be 21  mm. Free hand resection is taken to 12 mm, a 35 template is placed, lug holes are drilled, trial patella is placed, and it tracks normally. Osteophytes are removed off the posterior femur  with the trial in place. All trials are removed and the cut bone surfaces prepared with pulsatile lavage. Cement is mixed and once ready for implantation, the size 3 tibial implant, size  3 posterior stabilized femoral component, and the size 35 patella are cemented in place and the patella is held with the clamp. The trial insert is placed and the knee held in full extension. The Exparel (20 ml mixed with 30 ml saline) and .25% Bupivicaine, are injected into the extensor mechanism, posterior capsule, medial and lateral gutters and subcutaneous tissues.  All extruded cement is removed and once the cement is hard the permanent 12.5 mm posterior stabilized rotating platform insert is placed into the tibial tray.      The wound is copiously irrigated with saline solution and the extensor mechanism closed over a hemovac drain with #1 V-loc suture. The tourniquet is released for a total tourniquet time of 32  minutes. Flexion against gravity is 140 degrees and the patella tracks normally. Subcutaneous tissue is closed with 2.0 vicryl and subcuticular with running 4.0 Monocryl. The incision is cleaned and dried and steri-strips and a bulky sterile dressing are applied. The limb is placed into a knee immobilizer and the patient is awakened and transported to recovery in stable condition.      Please note that a surgical assistant was a medical necessity for this procedure in order to perform it in a safe and expeditious manner. Surgical assistant was necessary to retract the ligaments and vital neurovascular structures to prevent injury to them and also necessary for proper positioning of the limb to allow for anatomic placement of the prosthesis.   Christine RankinFrank V. Tatia Petrucci, MD    04/04/2016, 9:17 AM

## 2016-04-04 NOTE — Interval H&P Note (Signed)
History and Physical Interval Note:  04/04/2016 6:46 AM  Christine SleetGwendolyn J Iwasaki  has presented today for surgery, with the diagnosis of LEFT KNEE OA  The various methods of treatment have been discussed with the patient and family. After consideration of risks, benefits and other options for treatment, the patient has consented to  Procedure(s): LEFT TOTAL KNEE ARTHROPLASTY (Left) as a surgical intervention .  The patient's history has been reviewed, patient examined, no change in status, stable for surgery.  I have reviewed the patient's chart and labs.  Questions were answered to the patient's satisfaction.     Loanne DrillingALUISIO,Hashir Deleeuw V

## 2016-04-04 NOTE — Anesthesia Preprocedure Evaluation (Signed)
Anesthesia Evaluation  Patient identified by MRN, date of birth, ID band Patient awake    Reviewed: Allergy & Precautions, NPO status , Patient's Chart, lab work & pertinent test results  Airway Mallampati: II  TM Distance: >3 FB     Dental   Pulmonary former smoker,    breath sounds clear to auscultation       Cardiovascular hypertension, +CHF  + Valvular Problems/Murmurs  Rhythm:Regular Rate:Normal     Neuro/Psych    GI/Hepatic negative GI ROS, Neg liver ROS,   Endo/Other  Hypothyroidism   Renal/GU negative Renal ROS     Musculoskeletal  (+) Arthritis ,   Abdominal   Peds  Hematology   Anesthesia Other Findings   Reproductive/Obstetrics                             Anesthesia Physical Anesthesia Plan  ASA: III  Anesthesia Plan: Spinal   Post-op Pain Management:    Induction: Intravenous  Airway Management Planned:   Additional Equipment:   Intra-op Plan:   Post-operative Plan:   Informed Consent: I have reviewed the patients History and Physical, chart, labs and discussed the procedure including the risks, benefits and alternatives for the proposed anesthesia with the patient or authorized representative who has indicated his/her understanding and acceptance.   Dental advisory given  Plan Discussed with: CRNA and Anesthesiologist  Anesthesia Plan Comments:         Anesthesia Quick Evaluation

## 2016-04-04 NOTE — Anesthesia Procedure Notes (Signed)
Spinal  Patient location during procedure: OR Start time: 04/04/2016 8:13 AM End time: 04/04/2016 8:17 AM Reason for block: at surgeon's request Staffing Resident/CRNA: Anne Fu Performed: resident/CRNA  Preanesthetic Checklist Completed: patient identified, site marked, surgical consent, pre-op evaluation, timeout performed, IV checked, risks and benefits discussed and monitors and equipment checked Spinal Block Patient position: sitting Prep: Betadine Patient monitoring: heart rate, continuous pulse ox and blood pressure Approach: right paramedian Location: L2-3 Injection technique: single-shot Needle Needle type: Whitacre  Needle gauge: 25 G Needle length: 9 cm Assessment Sensory level: T6 Additional Notes Expiration date of kit checked and confirmed. Patient tolerated procedure well, without complications. X 1 attempt with noted clear CSF return. Loss of motor and sensory on exam post injection.

## 2016-04-04 NOTE — Transfer of Care (Signed)
Immediate Anesthesia Transfer of Care Note  Patient: Christine SleetGwendolyn J Boehne  Procedure(s) Performed: Procedure(s): LEFT TOTAL KNEE ARTHROPLASTY (Left)  Patient Location: PACU  Anesthesia Type:Spinal  Level of Consciousness:  sedated, patient cooperative and responds to stimulation  Airway & Oxygen Therapy:Patient Spontanous Breathing and Patient connected to face mask oxgen  Post-op Assessment:  Report given to PACU RN and Post -op Vital signs reviewed and stable  Post vital signs:  Reviewed and stable  Last Vitals:  Vitals:   04/04/16 0602  BP: (!) 145/80  Pulse: (!) 58  Resp: 16  Temp: 36.7 C    Complications: No apparent anesthesia complications

## 2016-04-04 NOTE — H&P (View-Only) (Signed)
Christine SleetGwendolyn J Montecalvo DOB: 02/13/1951 Married / Language: English / Race: White Female Date of Admission:  04/04/2016 CC:  Bilateral Knee Pain History of Present Illness  The patient is a 65 year old female who comes in for a preoperative History and Physical. The patient is scheduled for a left total knee arthroplasty to be performed by Dr. Gus RankinFrank V. Aluisio, [facility]04[facility]. The patient is a 65 year old female who presented for follow up of their knee. The patient is being followed for their bilateral knee pain and osteoarthritis. They are now 4 month(s) out from cortisone injections. The following medication has been used for pain control: Tylenol. Note for "Follow-up Knee": Patient states that the last injection did not seem to work as quickly. She said that she was doing more during that time. Her left knee is popping and waking her at night. The right knee hurts, but not as badly as the left. Unfortunately, her left greater than right knee is getting progressively worse. She has got a valgus deformity and deformity is worsening. The knee is starting to feel unstable to her. The injections helped but for only short amount of time and they do not help with the stability or help with her function. They do help with pain, but for a short amount of time. Right knee is tolerable, but the left one is really getting a lot worse. She is ready to proceedw ith surgery on the left knee. She is also requesting a right knee cortisone injection. They have been treated conservatively in the past for the above stated problem and despite conservative measures, they continue to have progressive pain and severe functional limitations and dysfunction. They have failed non-operative management including home exercise, medications, and injections. It is felt that they would benefit from undergoing total joint replacement. Risks and benefits of the procedure have been discussed with the patient and they  elect to proceed with surgery. There are no active contraindications to surgery such as ongoing infection or rapidly progressive neurological disease.  Allergies Augmentin *PENICILLINS*  Hands itching and break out  Past Medical History High blood pressure  Congestive Heart Failure  Heart murmur  Hypothyroidism  Osteoarthritis  Primary osteoarthritis of left knee (M17.12)  Menopause  Family History Heart Disease  Father, Paternal Grandfather. Osteoarthritis  Mother. Rheumatoid Arthritis  Mother.  Social History  Children  2 Current work status  retired Scientist, physiologicalxercise  Exercises never Former drinker  04/22/2015: In the past drank wine only occasionally per week Living situation  live with spouse Marital status  married No history of drug/alcohol rehab  Not under pain contract  Number of flights of stairs before winded  1 Tobacco / smoke exposure  04/22/2015: no Tobacco use  Former smoker. 04/22/2015: smoke(d) 1 pack(s) per day Advance Directives  Living Will, Healthcare POA  Medication History Metoprolol Tartrate (100MG  Tablet, Oral two times daily) Active. Levothyroxine Sodium (75MCG Tablet, Oral) Active. Furosemide (20MG  Tablet, Oral) Active. Vitamin D (Oral) Specific strength unknown - Active.  Past Surgical History Dilation and Curettage of Uterus - Multiple  1994, 09/2009 Gallbladder Surgery  Date: 2005. laporoscopic Hernia Repair  Date: 09/2015. Umbilical Hernia  Review of Systems General Not Present- Chills, Fatigue, Fever, Memory Loss, Night Sweats, Weight Gain and Weight Loss. Skin Not Present- Eczema, Hives, Itching, Lesions and Rash. HEENT Not Present- Dentures, Double Vision, Headache, Hearing Loss, Tinnitus and Visual Loss. Respiratory Not Present- Allergies, Chronic Cough, Coughing up blood, Shortness of breath  at rest and Shortness of breath with exertion. Cardiovascular Not Present- Chest Pain, Difficulty Breathing Lying Down,  Murmur, Palpitations, Racing/skipping heartbeats and Swelling. Gastrointestinal Not Present- Abdominal Pain, Bloody Stool, Constipation, Diarrhea, Difficulty Swallowing, Heartburn, Jaundice, Loss of appetitie, Nausea and Vomiting. Female Genitourinary Not Present- Blood in Urine, Discharge, Flank Pain, Incontinence, Painful Urination, Urgency, Urinary frequency, Urinary Retention, Urinating at Night and Weak urinary stream. Musculoskeletal Present- Joint Pain, Joint Swelling and Morning Stiffness. Not Present- Back Pain, Muscle Pain, Muscle Weakness and Spasms. Neurological Not Present- Blackout spells, Difficulty with balance, Dizziness, Paralysis, Tremor and Weakness. Psychiatric Not Present- Insomnia.  Vitals  Weight: 223 lb Height: 67in Body Surface Area: 2.12 m Body Mass Index: 34.93 kg/m  Pulse: 56 (Regular)  BP: 142/68 (Sitting, Right Arm, Standard)   Physical Exam General Mental Status -Alert, cooperative and good historian. General Appearance-pleasant, Not in acute distress. Orientation-Oriented X3. Build & Nutrition-Well nourished and Well developed.  Head and Neck Head-normocephalic, atraumatic . Neck Global Assessment - supple, no bruit auscultated on the right, no bruit auscultated on the left.  Eye Vision-Wears corrective lenses. Pupil - Bilateral-Regular and Round. Motion - Bilateral-EOMI.  Chest and Lung Exam Auscultation Breath sounds - clear at anterior chest wall and clear at posterior chest wall. Adventitious sounds - No Adventitious sounds.  Cardiovascular Auscultation Rhythm - Regular rate and rhythm. Heart Sounds - S1 WNL and S2 WNL. Murmurs & Other Heart Sounds - Auscultation of the heart reveals - No Murmurs.  Abdomen Palpation/Percussion Tenderness - Abdomen is non-tender to palpation. Rigidity (guarding) - Abdomen is soft. Auscultation Auscultation of the abdomen reveals - Bowel sounds normal.  Female  Genitourinary Note: Not done, not pertinent to present illness   Musculoskeletal Note: On exam, she is alert and oriented, in no apparent distress. Her left knee shows a significant valgus deformity. Her range of motion is about 5 to 125 degrees. There is some pseudolaxity but no true instability. Right knee shows slight varus range 5 to 125. Some tenderness medial greater than lateral. No instability.  Radiographs are reviewed, AP both knees and lateral. She has got significant bone-on-bone change in the lateral and patellofemoral compartments of the left knee and has bone-on-bone in the medial and patellofemoral of the right. The left looks worse overall.   Assessment & Plan  Primary osteoarthritis of right knee (M17.11) Primary osteoarthritis of left knee (M17.12)  Note:Surgical Plans: Left Total Knee Replacement and a Right Knee Cortisone Injection  Disposition: Home with husband  PCP: Dr. Shary DecampBrian McKenzie  IV TXA  Anesthesia Issues: None  [facility] Study Patient VIRTUAL THERAPY  Signed electronically by Beckey RutterAlezandrew L Perkins, III PA-C

## 2016-04-05 LAB — CBC
HEMATOCRIT: 35.2 % — AB (ref 36.0–46.0)
Hemoglobin: 11.9 g/dL — ABNORMAL LOW (ref 12.0–15.0)
MCH: 31.2 pg (ref 26.0–34.0)
MCHC: 33.8 g/dL (ref 30.0–36.0)
MCV: 92.1 fL (ref 78.0–100.0)
PLATELETS: 169 10*3/uL (ref 150–400)
RBC: 3.82 MIL/uL — AB (ref 3.87–5.11)
RDW: 13.4 % (ref 11.5–15.5)
WBC: 12.4 10*3/uL — AB (ref 4.0–10.5)

## 2016-04-05 LAB — BASIC METABOLIC PANEL
Anion gap: 6 (ref 5–15)
BUN: 20 mg/dL (ref 6–20)
CALCIUM: 8.5 mg/dL — AB (ref 8.9–10.3)
CO2: 25 mmol/L (ref 22–32)
CREATININE: 0.81 mg/dL (ref 0.44–1.00)
Chloride: 107 mmol/L (ref 101–111)
GLUCOSE: 136 mg/dL — AB (ref 65–99)
Potassium: 4.2 mmol/L (ref 3.5–5.1)
Sodium: 138 mmol/L (ref 135–145)

## 2016-04-05 MED ORDER — TRAMADOL HCL 50 MG PO TABS: 50.0000 mg | ORAL_TABLET | Freq: Four times a day (QID) | ORAL | 1 refills | Status: DC | PRN

## 2016-04-05 MED ORDER — RIVAROXABAN 10 MG PO TABS: 10.0000 mg | ORAL_TABLET | Freq: Every day | ORAL | 0 refills | Status: DC

## 2016-04-05 MED ORDER — OXYCODONE HCL 5 MG PO TABS: 5.0000 mg | ORAL_TABLET | ORAL | 0 refills | Status: DC | PRN

## 2016-04-05 MED ORDER — METHOCARBAMOL 500 MG PO TABS: 500.0000 mg | ORAL_TABLET | Freq: Four times a day (QID) | ORAL | 0 refills | Status: DC | PRN

## 2016-04-05 NOTE — Progress Notes (Signed)
   Subjective: 1 Day Post-Op Procedure(s) (LRB): LEFT TOTAL KNEE ARTHROPLASTY (Left) Patient reports pain as mild.   Patient seen in rounds with Dr. Lequita HaltAluisio.  Not much sleep lat night otherwise doing okay. Patient is well, but has had some minor complaints of pain in the knee, requiring pain medications We will resume therapy today. Walked about 60 feet yesterday DOS. Plan is to go Home after hospital stay.  Objective: Vital signs in last 24 hours: Temp:  [97.4 F (36.3 C)-98.1 F (36.7 C)] 98 F (36.7 C) (08/15 0540) Pulse Rate:  [46-62] 56 (08/15 0540) Resp:  [13-100] 15 (08/15 0540) BP: (104-141)/(42-78) 111/55 (08/15 0540) SpO2:  [12 %-100 %] 100 % (08/15 0540) Weight:  [100.7 kg (222 lb)] 100.7 kg (222 lb) (08/14 1145)  Intake/Output from previous day:  Intake/Output Summary (Last 24 hours) at 04/05/16 0824 Last data filed at 04/05/16 0600  Gross per 24 hour  Intake           4392.5 ml  Output             1635 ml  Net           2757.5 ml    Intake/Output this shift: No intake/output data recorded.  Labs:  Recent Labs  04/05/16 0428  HGB 11.9*    Recent Labs  04/05/16 0428  WBC 12.4*  RBC 3.82*  HCT 35.2*  PLT 169    Recent Labs  04/05/16 0428  NA 138  K 4.2  CL 107  CO2 25  BUN 20  CREATININE 0.81  GLUCOSE 136*  CALCIUM 8.5*   No results for input(s): LABPT, INR in the last 72 hours.  EXAM General - Patient is Alert, Appropriate and Oriented Extremity - Neurovascular intact Sensation intact distally Dorsiflexion/Plantar flexion intact Dressing - dressing C/D/I Motor Function - intact, moving foot and toes well on exam.  Hemovac pulled without difficulty.  Past Medical History:  Diagnosis Date  . Arthritis   . CHF (congestive heart failure) (HCC)   . Essential hypertension 10/15/2015  . Heart murmur   . Hypertension   . Hypothyroidism 10/15/2015    Assessment/Plan: 1 Day Post-Op Procedure(s) (LRB): LEFT TOTAL KNEE ARTHROPLASTY  (Left) Principal Problem:   OA (osteoarthritis) of knee  Estimated body mass index is 34.77 kg/m as calculated from the following:   Height as of this encounter: 5\' 7"  (1.702 m).   Weight as of this encounter: 100.7 kg (222 lb). Advance diet Up with therapy Plan for discharge tomorrow Discharge home - No home health PT [facility] Study Patient VIRTUAL THERAPY  DVT Prophylaxis - Xarelto Weight-Bearing as tolerated to left leg D/C O2 and Pulse OX and try on Room Air  Avel Peacerew Sinia Antosh, PA-C Orthopaedic Surgery 04/05/2016, 8:24 AM

## 2016-04-05 NOTE — Discharge Summary (Signed)
Physician Discharge Summary   Patient ID: [name] MRN: [id number] DOB/AGE: 10/07/1950 65 y.o.  Admit date: 04/04/2016 Discharge date:  04/06/2016  Primary Diagnosis:  Osteoarthritis  Left knee(s)  Admission Diagnoses:  Past Medical History:  Diagnosis Date  . Arthritis   . CHF (congestive heart failure) ([place])   . Essential hypertension 10/15/2015  . Heart murmur   . Hypertension   . Hypothyroidism 10/15/2015   Discharge Diagnoses:   Principal Problem:   OA (osteoarthritis) of knee  Estimated body mass index is 34.77 kg/m as calculated from the following:   Height as of this encounter: 5' 7"  (1.702 m).   Weight as of this encounter: 100.7 kg (222 lb).  Procedure:  Procedure(s) (LRB): LEFT TOTAL KNEE ARTHROPLASTY (Left)   Consults: None  HPI: [name] is a 64 y.o. year old female with end stage OA of her left knee with progressively worsening pain and dysfunction. She has constant pain, with activity and at rest and significant functional deficits with difficulties even with ADLs. She has had extensive non-op management including analgesics, injections of cortisone and viscosupplements, and home exercise program, but remains in significant pain with significant dysfunction. Radiographs show bone on bone arthritis all 3 compartments. She presents now for left Total Knee Arthroplasty.    Laboratory Data: Admission on 04/04/2016  Component Date Value Ref Range Status  . ABO/RH(D) 04/04/2016 B NEG   Final  . Antibody Screen 04/04/2016 POS   Final  . Sample Expiration 04/04/2016 04/07/2016   Final  . DAT, IgG 04/04/2016 NEG   Final  . Unit Number 04/04/2016 [id number]   Final  . Blood Component Type 04/04/2016 RED CELLS,LR   Final  . Unit division 04/04/2016 00   Final  . Status of Unit 04/04/2016 ALLOCATED   Final  . Donor AG Type 04/04/2016 NEGATIVE FOR C ANTIGEN   Final  . Transfusion Status 04/04/2016 OK TO TRANSFUSE   Final  . Crossmatch Result  04/04/2016 COMPATIBLE   Final  . WBC 04/05/2016 12.4* 4.0 - 10.5 K/uL Final  . RBC 04/05/2016 3.82* 3.87 - 5.11 MIL/uL Final  . Hemoglobin 04/05/2016 11.9* 12.0 - 15.0 g/dL Final  . HCT 04/05/2016 35.2* 36.0 - 46.0 % Final  . MCV 04/05/2016 92.1  78.0 - 100.0 fL Final  . MCH 04/05/2016 31.2  26.0 - 34.0 pg Final  . MCHC 04/05/2016 33.8  30.0 - 36.0 g/dL Final  . RDW 04/05/2016 13.4  11.5 - 15.5 % Final  . Platelets 04/05/2016 169  150 - 400 K/uL Final  . Sodium 04/05/2016 138  135 - 145 mmol/L Final  . Potassium 04/05/2016 4.2  3.5 - 5.1 mmol/L Final  . Chloride 04/05/2016 107  101 - 111 mmol/L Final  . CO2 04/05/2016 25  22 - 32 mmol/L Final  . Glucose, Bld 04/05/2016 136* 65 - 99 mg/dL Final  . BUN 04/05/2016 20  6 - 20 mg/dL Final  . Creatinine, Ser 04/05/2016 0.81  0.44 - 1.00 mg/dL Final  . Calcium 04/05/2016 8.5* 8.9 - 10.3 mg/dL Final  . GFR calc non Af Amer 04/05/2016 >60  >60 mL/min Final  . GFR calc Af Amer 04/05/2016 >60  >60 mL/min Final   Comment: (NOTE) The eGFR has been calculated using the CKD EPI equation. This calculation has not been validated in all clinical situations. eGFR's persistently <60 mL/min signify possible Chronic Kidney Disease.   Georgiann Hahn gap 04/05/2016 6  5 - 15 Final  Hospital  Outpatient Visit on 03/28/2016  Component Date Value Ref Range Status  . MRSA, PCR 03/28/2016 NEGATIVE  NEGATIVE Final  . Staphylococcus aureus 03/28/2016 POSITIVE* NEGATIVE Final   Comment:        The Xpert SA Assay (FDA approved for NASAL specimens in patients over 11 years of age), is one component of a comprehensive surveillance program.  Test performance has been validated by [facility] for patients greater than or equal to 59 year old. It is not intended to diagnose infection nor to guide or monitor treatment.   Marland Kitchen aPTT 03/28/2016 32  24 - 36 seconds Final  . Sodium 03/28/2016 138  135 - 145 mmol/L Final  . Potassium 03/28/2016 4.1  3.5 - 5.1 mmol/L Final  .  Chloride 03/28/2016 101  101 - 111 mmol/L Final  . CO2 03/28/2016 30  22 - 32 mmol/L Final  . Glucose, Bld 03/28/2016 86  65 - 99 mg/dL Final  . BUN 03/28/2016 23* 6 - 20 mg/dL Final  . Creatinine, Ser 03/28/2016 0.79  0.44 - 1.00 mg/dL Final  . Calcium 03/28/2016 9.4  8.9 - 10.3 mg/dL Final  . Total Protein 03/28/2016 7.3  6.5 - 8.1 g/dL Final  . Albumin 03/28/2016 4.1  3.5 - 5.0 g/dL Final  . AST 03/28/2016 23  15 - 41 U/L Final  . ALT 03/28/2016 14  14 - 54 U/L Final  . Alkaline Phosphatase 03/28/2016 89  38 - 126 U/L Final  . Total Bilirubin 03/28/2016 0.9  0.3 - 1.2 mg/dL Final  . GFR calc non Af Amer 03/28/2016 >60  >60 mL/min Final  . GFR calc Af Amer 03/28/2016 >60  >60 mL/min Final   Comment: (NOTE) The eGFR has been calculated using the CKD EPI equation. This calculation has not been validated in all clinical situations. eGFR's persistently <60 mL/min signify possible Chronic Kidney Disease.   . Anion gap 03/28/2016 7  5 - 15 Final  . Prothrombin Time 03/28/2016 14.2  11.4 - 15.2 seconds Final  . INR 03/28/2016 1.09   Final  . ABO/RH(D) 03/28/2016 B NEG   Final  . Antibody Screen 03/28/2016 POS   Final  . Sample Expiration 03/28/2016 03/31/2016   Final  . Extend sample reason 03/28/2016 NO TRANSFUSIONS OR PREGNANCY IN THE PAST 3 MONTHS   Final  . Antibody Identification 40/81/4481 ANTI D ANTI C   Final  . DAT, IgG 03/28/2016 NEG   Final  . PT AG Type 03/28/2016 NEGATIVE FOR C ANTIGEN   Final     X-Rays:No results found.  EKG: Orders placed or performed during the hospital encounter of 10/12/15  . EKG 12-Lead  . EKG 12-Lead  . EKG     Hospital Course: [name] is a 65 y.o. who was admitted to [facility]. They were brought to the operating room on 04/04/2016 and underwent Procedure(s): LEFT TOTAL KNEE ARTHROPLASTY.  Patient tolerated the procedure well and was later transferred to the recovery room and then to the orthopaedic floor for  postoperative care.  They were given PO and IV analgesics for pain control following their surgery.  They were given 24 hours of postoperative antibiotics of  Anti-infectives    Start     Dose/Rate Route Frequency Ordered Stop   04/04/16 2000  vancomycin (VANCOCIN) IVPB 1000 mg/200 mL premix     1,000 mg 200 mL/hr over 60 Minutes Intravenous Every 12 hours 04/04/16 1140 04/04/16 2025   04/04/16 0645  vancomycin (  VANCOCIN) 1,500 mg in sodium chloride 0.9 % 500 mL IVPB     1,500 mg 250 mL/hr over 120 Minutes Intravenous On call to O.R. 04/04/16 0636 04/04/16 0930   04/04/16 0627  vancomycin (VANCOCIN) IVPB 1000 mg/200 mL premix  Status:  Discontinued     1,000 mg 200 mL/hr over 60 Minutes Intravenous On call to O.R. 04/04/16 4270 04/04/16 6237     and started on DVT prophylaxis in the form of Xarelto.   PT and OT were ordered for total joint protocol.  Discharge planning consulted to help with postop disposition and equipment needs.  Patient had a tough night on the evening of surgery and not much sleep.  They started to get up OOB with therapy on day one. Hemovac drain was pulled without difficulty.  Continued to work with therapy into day two.  Dressing was changed on day two and the incision was healing well. Patient was seen in rounds and was ready to go home.  Diet - Cardiac diet Follow up - in 2 weeks Activity - WBAT Disposition - Home Condition Upon Discharge - Good D/C Meds - See DC Summary DVT Prophylaxis - Xarelto  Discharge Instructions    Call MD / Call 911    Complete by:  As directed   If you experience chest pain or shortness of breath, CALL 911 and be transported to the hospital emergency room.  If you develope a fever above 101 F, pus (white drainage) or increased drainage or redness at the wound, or calf pain, call your surgeon's office.   Change dressing    Complete by:  As directed   Change dressing daily with sterile 4 x 4 inch gauze dressing and apply TED hose. Do not  submerge the incision under water.   Constipation Prevention    Complete by:  As directed   Drink plenty of fluids.  Prune juice may be helpful.  You may use a stool softener, such as Colace (over the counter) 100 mg twice a day.  Use MiraLax (over the counter) for constipation as needed.   Diet - low sodium heart healthy    Complete by:  As directed   Discharge instructions    Complete by:  As directed   Pick up stool softner and laxative for home use following surgery while on pain medications. Do not submerge incision under water. Please use good hand washing techniques while changing dressing each day. May shower starting three days after surgery. Please use a clean towel to pat the incision dry following showers. Continue to use ice for pain and swelling after surgery. Do not use any lotions or creams on the incision until instructed by your surgeon.   Postoperative Constipation Protocol  Constipation - defined medically as fewer than three stools per week and severe constipation as less than one stool per week.  One of the most common issues patients have following surgery is constipation.  Even if you have a regular bowel pattern at home, your normal regimen is likely to be disrupted due to multiple reasons following surgery.  Combination of anesthesia, postoperative narcotics, change in appetite and fluid intake all can affect your bowels.  In order to avoid complications following surgery, here are some recommendations in order to help you during your recovery period.  Colace (docusate) - Pick up an over-the-counter form of Colace or another stool softener and take twice a day as long as you are requiring postoperative pain medications.  Take with a  full glass of water daily.  If you experience loose stools or diarrhea, hold the colace until you stool forms back up.  If your symptoms do not get better within 1 week or if they get worse, check with your doctor.  Dulcolax (bisacodyl) -  Pick up over-the-counter and take as directed by the product packaging as needed to assist with the movement of your bowels.  Take with a full glass of water.  Use this product as needed if not relieved by Colace only.   MiraLax (polyethylene glycol) - Pick up over-the-counter to have on hand.  MiraLax is a solution that will increase the amount of water in your bowels to assist with bowel movements.  Take as directed and can mix with a glass of water, juice, soda, coffee, or tea.  Take if you go more than two days without a movement. Do not use MiraLax more than once per day. Call your doctor if you are still constipated or irregular after using this medication for 7 days in a row.  If you continue to have problems with postoperative constipation, please contact the office for further assistance and recommendations.  If you experience "the worst abdominal pain ever" or develop nausea or vomiting, please contact the office immediatly for further recommendations for treatment.   Take Xarelto for two and a half more weeks, then discontinue Xarelto. Once the patient has completed the blood thinner regimen, then take a Baby 81 mg Aspirin daily for three more weeks.   Do not put a pillow under the knee. Place it under the heel.    Complete by:  As directed   Do not sit on low chairs, stoools or toilet seats, as it may be difficult to get up from low surfaces    Complete by:  As directed   Driving restrictions    Complete by:  As directed   No driving until released by the physician.   Increase activity slowly as tolerated    Complete by:  As directed   Lifting restrictions    Complete by:  As directed   No lifting until released by the physician.   Patient may shower    Complete by:  As directed   You may shower without a dressing once there is no drainage.  Do not wash over the wound.  If drainage remains, do not shower until drainage stops.   TED hose    Complete by:  As directed   Use stockings  (TED hose) for 3 weeks on both leg(s).  You may remove them at night for sleeping.   Weight bearing as tolerated    Complete by:  As directed   Laterality:  left   Extremity:  Lower       Medication List    STOP taking these medications   Vitamin D3 2000 units Tabs     TAKE these medications   acetaminophen 650 MG CR tablet Commonly known as:  TYLENOL Take 1,300 mg by mouth at bedtime.   furosemide 20 MG tablet Commonly known as:  LASIX Take 40 mg by mouth daily.   levothyroxine 75 MCG tablet Commonly known as:  SYNTHROID, LEVOTHROID Take 1 tablet by mouth every morning.   methocarbamol 500 MG tablet Commonly known as:  ROBAXIN Take 1 tablet (500 mg total) by mouth every 6 (six) hours as needed for muscle spasms.   metoprolol 100 MG tablet Commonly known as:  LOPRESSOR Take 1 tablet by mouth 2 (two) times  daily.   oxyCODONE 5 MG immediate release tablet Commonly known as:  Oxy IR/ROXICODONE Take 1-2 tablets (5-10 mg total) by mouth every 3 (three) hours as needed for moderate pain or severe pain.   rivaroxaban 10 MG Tabs tablet Commonly known as:  XARELTO Take 1 tablet (10 mg total) by mouth daily with breakfast. Take Xarelto for two and a half more weeks, then discontinue Xarelto. Once the patient has completed the blood thinner regimen, then take a Baby 81 mg Aspirin daily for three more weeks.   traMADol 50 MG tablet Commonly known as:  ULTRAM Take 1-2 tablets (50-100 mg total) by mouth every 6 (six) hours as needed (mild pain).      Follow-up Information    Gearlean Alf, MD. Schedule an appointment as soon as possible for a visit on 04/19/2016.   Specialty:  Orthopedic Surgery Why:  Call office at [phone] to setup appointment on Tuesday 04/19/2016 with Dr. Wynelle Link. Contact information: [address] [place] [address] [phone]           Signed: Arlee Muslim, PA-C Orthopaedic Surgery 04/05/2016, 10:56 PM

## 2016-04-05 NOTE — Progress Notes (Signed)
Physical Therapy Treatment Patient Details Name: Mariel SleetGwendolyn J Rilling MRN: [id number] DOB: 03/04/1951 Today's Date: 04/05/2016    History of Present Illness Pt is a 65 year old female s/p L TKA    PT Comments    Pt ambulated in hallway and performed LE exercises.  Follow Up Recommendations  Home health PT (virtual PT)     Equipment Recommendations  None recommended by PT    Recommendations for Other Services       Precautions / Restrictions Precautions Precautions: Fall;Knee Required Braces or Orthoses: Knee Immobilizer - Left Restrictions Weight Bearing Restrictions: (P) No Other Position/Activity Restrictions: WBAT    Mobility  Bed Mobility               General bed mobility comments: pt up in recliner on arrival  Transfers Overall transfer level: Needs assistance Equipment used: Rolling walker (2 wheeled) Transfers: Sit to/from Stand Sit to Stand: Min guard         General transfer comment: verbal cues L LE positioning  Ambulation/Gait Ambulation/Gait assistance: Min guard Ambulation Distance (Feet): 100 Feet Assistive device: Rolling walker (2 wheeled) Gait Pattern/deviations: Step-to pattern;Decreased stance time - left;Antalgic;Trunk flexed Gait velocity: decr   General Gait Details: verbal cues for sequence, RW positioning, posture, step length   Stairs            Wheelchair Mobility    Modified Rankin (Stroke Patients Only)       Balance                                    Cognition Arousal/Alertness: Awake/alert Behavior During Therapy: WFL for tasks assessed/performed Overall Cognitive Status: Within Functional Limits for tasks assessed                      Exercises Total Joint Exercises Ankle Circles/Pumps: AROM;Both;10 reps Quad Sets: AROM;Both;10 reps Short Arc QuadBarbaraann Boys: AAROM;Both;10 reps Heel Slides: AAROM;Seated;10 reps;Left Hip ABduction/ADduction: AAROM;Left;10 reps Straight Leg Raises:  AAROM;Left;10 reps Goniometric ROM: AAROM knee flexion 80* sitting    General Comments        Pertinent Vitals/Pain Pain Assessment: 0-10 Pain Score: 3  Pain Location: L knee Pain Descriptors / Indicators: Aching;Sore Pain Intervention(s): Limited activity within patient's tolerance;Monitored during session;Repositioned    Home Living                      Prior Function            PT Goals (current goals can now be found in the care plan section) Progress towards PT goals: Progressing toward goals    Frequency  7X/week    PT Plan Current plan remains appropriate    Co-evaluation             End of Session Equipment Utilized During Treatment: Left knee immobilizer Activity Tolerance: Patient tolerated treatment well Patient left: in chair;with call bell/phone within reach;with chair alarm set     Time: [id number] PT Time Calculation (min) (ACUTE ONLY): 20 min  Charges:  $Therapeutic Exercise: 8-22 mins                    G Codes:      Marshon Bangs,KATHrine E 04/05/2016, 1:04 PM Zenovia JarredKati Dayln Tugwell, PT, DPT 04/05/2016 Pager: [phone]

## 2016-04-05 NOTE — Progress Notes (Signed)
Occupational Therapy Evaluation Patient Details Name: [name] MRN: [id number] DOB: 08/08/1951 Today's Date: 04/05/2016    History of Present Illness Pt is a 65 year old female s/p L TKA   Clinical Impression   Patient presents to OT with decreased ADL independence s/p L TKA. Will benefit from skilled OT to maximize function and to facilitate a safe discharge. OT will follow.    Follow Up Recommendations  No OT follow up    Equipment Recommendations  3 in 1 bedside comode    Recommendations for Other Services       Precautions / Restrictions Precautions Precautions: Fall;Knee Required Braces or Orthoses: Knee Immobilizer - Left Restrictions Weight Bearing Restrictions: No Other Position/Activity Restrictions: WBAT      Mobility Bed Mobility Overal bed mobility: Needs Assistance Bed Mobility: Supine to Sit     Supine to sit: Min assist     General bed mobility comments: for LLE  Transfers Overall transfer level: Needs assistance Equipment used: Rolling walker (2 wheeled) Transfers: Sit to/from UGI CorporationStand;Stand Pivot Transfers Sit to Stand: Min guard Stand pivot transfers: Min guard       General transfer comment: verbal cues LLE positioning due to KI    Balance                                            ADL Overall ADL's : Needs assistance/impaired Eating/Feeding: Independent;Sitting   Grooming: Wash/dry hands;Set up;Sitting   Upper Body Bathing: Minimal assitance;Sitting   Lower Body Bathing: Moderate assistance;Sit to/from stand   Upper Body Dressing : Minimal assistance;Sitting   Lower Body Dressing: Moderate assistance;Sit to/from stand   Toilet Transfer: RW;Min guard;BSC   Toileting- ArchitectClothing Manipulation and Hygiene: Min guard;Sit to/from stand       Functional mobility during ADLs: Rolling walker;Min guard General ADL Comments: Patient practiced BSC transfer/toileting during session, then up to recliner. She  requests a 3 in 1 for home for use in shower as well as next to bed. Will practice shower transfer next session. Patient reports her husband can assist with LB self-care.     Vision     Perception     Praxis      Pertinent Vitals/Pain Pain Assessment: 0-10 Pain Score: 3  Pain Location: L knee Pain Descriptors / Indicators: Aching;Sore Pain Intervention(s): Monitored during session;Repositioned     Hand Dominance     Extremity/Trunk Assessment Upper Extremity Assessment Upper Extremity Assessment: Overall WFL for tasks assessed   Lower Extremity Assessment Lower Extremity Assessment: Defer to PT evaluation       Communication Communication Communication: No difficulties   Cognition Arousal/Alertness: Awake/alert Behavior During Therapy: WFL for tasks assessed/performed Overall Cognitive Status: Within Functional Limits for tasks assessed                     General Comments       Exercises       Shoulder Instructions      Home Living Family/patient expects to be discharged to:: Private residence Living Arrangements: Spouse/significant other Available Help at Discharge: Family Type of Home: House Home Access: Ramped entrance     Home Layout: One level     Bathroom Shower/Tub: Producer, television/film/videoWalk-in shower   Bathroom Toilet: Handicapped height Bathroom Accessibility: Yes How Accessible: Accessible via walker Home Equipment: Walker - 2 wheels;Toilet riser;Shower seat  Prior Functioning/Environment Level of Independence: Independent with assistive device(s)        Comments: used RW PTA    OT Diagnosis: Acute pain   OT Problem List: Decreased strength;Decreased range of motion;Decreased knowledge of use of DME or AE;Impaired UE functional use   OT Treatment/Interventions: Self-care/ADL training;DME and/or AE instruction;Therapeutic activities;Patient/family education    OT Goals(Current goals can be found in the care plan section) Acute Rehab  OT Goals Patient Stated Goal: regain independence OT Goal Formulation: With patient Time For Goal Achievement: 04/19/16 Potential to Achieve Goals: Good  OT Frequency: Min 2X/week   Barriers to D/C:            Co-evaluation              End of Session Equipment Utilized During Treatment: Rolling walker;Left knee immobilizer Nurse Communication: Other (comment) [facility]  Activity Tolerance: Patient tolerated treatment well Patient left: in chair;with call bell/phone within reach;with chair alarm set   Time: [id number] OT Time Calculation (min): 18 min Charges:  OT General Charges $OT Visit: 1 Procedure OT Evaluation $OT Eval Low Complexity: 1 Procedure G-Codes:    Tattiana Fakhouri A 04/05/2016, 9:06 AM

## 2016-04-05 NOTE — Progress Notes (Signed)
Physical Therapy Treatment Note    04/05/16 1500  PT Visit Information  Last PT Received On 04/05/16  Assistance Needed +1  History of Present Illness Pt is a 65 year old female s/p L TKA  Subjective Data  Subjective Pt ambulated again in hallway and then assisted back to bed.  Precautions  Precautions Fall;Knee  Required Braces or Orthoses Knee Immobilizer - Left  Restrictions  Other Position/Activity Restrictions WBAT  Pain Assessment  Pain Assessment 0-10  Pain Score 3  Pain Location L knee  Pain Descriptors / Indicators Aching;Sore  Pain Intervention(s) Limited activity within patient's tolerance;Monitored during session;Repositioned;Ice applied  Cognition  Arousal/Alertness Awake/alert  Behavior During Therapy WFL for tasks assessed/performed  Overall Cognitive Status Within Functional Limits for tasks assessed  Bed Mobility  Overal bed mobility Needs Assistance  Bed Mobility Sit to Supine  Sit to supine Min guard  General bed mobility comments pt self assist L LE onto bed  Transfers  Overall transfer level Needs assistance  Equipment used Rolling walker (2 wheeled)  Transfers Sit to/from Stand  Sit to Stand Min guard  General transfer comment verbal cues L LE positioning  Ambulation/Gait  Ambulation/Gait assistance Min guard  Ambulation Distance (Feet) 80 Feet  Assistive device Rolling walker (2 wheeled)  Gait Pattern/deviations Step-to pattern;Antalgic;Decreased stance time - left;Trunk flexed  General Gait Details verbal cues for sequence, RW positioning, posture, step length  Gait velocity decr  PT - End of Session  Equipment Utilized During Treatment Left knee immobilizer  Activity Tolerance Patient tolerated treatment well  Patient left in bed;with call bell/phone within reach  PT - Assessment/Plan  PT Plan Current plan remains appropriate  PT Frequency (ACUTE ONLY) 7X/week  Follow Up Recommendations Home health PT (virtual PT)  PT equipment None  recommended by PT  PT Goal Progression  Progress towards PT goals Progressing toward goals  PT Time Calculation  PT Start Time (ACUTE ONLY) 1450  PT Stop Time (ACUTE ONLY) 1503  PT Time Calculation (min) (ACUTE ONLY) 13 min  PT General Charges  $$ ACUTE PT VISIT 1 Procedure  PT Treatments  $Gait Training 8-22 mins   Zenovia JarredKati Karthika Glasper, PT, DPT 04/05/2016 Pager: [phone]

## 2016-04-05 NOTE — Discharge Instructions (Addendum)
° °Dr. Frank Aluisio °Total Joint Specialist °[facility] °[address] °[place], [place] [address] °[phone] ° °TOTAL KNEE REPLACEMENT POSTOPERATIVE DIRECTIONS ° °Knee Rehabilitation, Guidelines Following Surgery  °Results after knee surgery are often greatly improved when you follow the exercise, range of motion and muscle strengthening exercises prescribed by your doctor. Safety measures are also important to protect the knee from further injury. Any time any of these exercises cause you to have increased pain or swelling in your knee joint, decrease the amount until you are comfortable again and slowly increase them. If you have problems or questions, call your caregiver or physical therapist for advice.  ° °HOME CARE INSTRUCTIONS  °Remove items at home which could result in a fall. This includes throw rugs or furniture in walking pathways.  °· ICE to the affected knee every three hours for 30 minutes at a time and then as needed for pain and swelling.  Continue to use ice on the knee for pain and swelling from surgery. You may notice swelling that will progress down to the foot and ankle.  This is normal after surgery.  Elevate the leg when you are not up walking on it.   °· Continue to use the breathing machine which will help keep your temperature down.  It is common for your temperature to cycle up and down following surgery, especially at night when you are not up moving around and exerting yourself.  The breathing machine keeps your lungs expanded and your temperature down. °· Do not place pillow under knee, focus on keeping the knee straight while resting ° °DIET °You may resume your previous home diet once your are discharged from the hospital. ° °DRESSING / WOUND CARE / SHOWERING °You may shower 3 days after surgery, but keep the wounds dry during showering.  You may use an occlusive plastic wrap (Press'n Seal for example), NO SOAKING/SUBMERGING IN THE BATHTUB.  If the  bandage gets wet, change with a clean dry gauze.  If the incision gets wet, pat the wound dry with a clean towel. °You may start showering once you are discharged home but do not submerge the incision under water. Just pat the incision dry and apply a dry gauze dressing on daily. °Change the surgical dressing daily and reapply a dry dressing each time. ° °ACTIVITY °Walk with your walker as instructed. °Use walker as long as suggested by your caregivers. °Avoid periods of inactivity such as sitting longer than an hour when not asleep. This helps prevent blood clots.  °You may resume a sexual relationship in one month or when given the OK by your doctor.  °You may return to work once you are cleared by your doctor.  °Do not drive a car for 6 weeks or until released by you surgeon.  °Do not drive while taking narcotics. ° °WEIGHT BEARING °Weight bearing as tolerated with assist device (walker, cane, etc) as directed, use it as long as suggested by your surgeon or therapist, typically at least 4-6 weeks. ° °POSTOPERATIVE CONSTIPATION PROTOCOL °Constipation - defined medically as fewer than three stools per week and severe constipation as less than one stool per week. ° °One of the most common issues patients have following surgery is constipation.  Even if you have a regular bowel pattern at home, your normal regimen is likely to be disrupted due to multiple reasons following surgery.  Combination of anesthesia, postoperative narcotics, change in appetite and fluid intake all can affect your bowels.    In order to avoid complications following surgery, here are some recommendations in order to help you during your recovery period. ° °Colace (docusate) - Pick up an over-the-counter form of Colace or another stool softener and take twice a day as long as you are requiring postoperative pain medications.  Take with a full glass of water daily.  If you experience loose stools or diarrhea, hold the colace until you stool forms  back up.  If your symptoms do not get better within 1 week or if they get worse, check with your doctor. ° °Dulcolax (bisacodyl) - Pick up over-the-counter and take as directed by the product packaging as needed to assist with the movement of your bowels.  Take with a full glass of water.  Use this product as needed if not relieved by Colace only.  ° °MiraLax (polyethylene glycol) - Pick up over-the-counter to have on hand.  MiraLax is a solution that will increase the amount of water in your bowels to assist with bowel movements.  Take as directed and can mix with a glass of water, juice, soda, coffee, or tea.  Take if you go more than two days without a movement. °Do not use MiraLax more than once per day. Call your doctor if you are still constipated or irregular after using this medication for 7 days in a row. ° °If you continue to have problems with postoperative constipation, please contact the office for further assistance and recommendations.  If you experience "the worst abdominal pain ever" or develop nausea or vomiting, please contact the office immediatly for further recommendations for treatment. ° °ITCHING ° If you experience itching with your medications, try taking only a single pain pill, or even half a pain pill at a time.  You can also use Benadryl over the counter for itching or also to help with sleep.  ° °TED HOSE STOCKINGS °Wear the elastic stockings on both legs for three weeks following surgery during the day but you may remove then at night for sleeping. ° °MEDICATIONS °See your medication summary on the “After Visit Summary” that the nursing staff will review with you prior to discharge.  You may have some home medications which will be placed on hold until you complete the course of blood thinner medication.  It is important for you to complete the blood thinner medication as prescribed by your surgeon.  Continue your approved medications as instructed at time of  discharge. ° °PRECAUTIONS °If you experience chest pain or shortness of breath - call 911 immediately for transfer to the hospital emergency department.  °If you develop a fever greater that 101 F, purulent drainage from wound, increased redness or drainage from wound, foul odor from the wound/dressing, or calf pain - CONTACT YOUR SURGEON.   °                                                °FOLLOW-UP APPOINTMENTS °Make sure you keep all of your appointments after your operation with your surgeon and caregivers. You should call the office at the above phone number and make an appointment for approximately two weeks after the date of your surgery or on the date instructed by your surgeon outlined in the "After Visit Summary". ° ° °RANGE OF MOTION AND STRENGTHENING EXERCISES  °Rehabilitation of the knee is important following a knee injury or   an operation. After just a few days of immobilization, the muscles of the thigh which control the knee become weakened and shrink (atrophy). Knee exercises are designed to build up the tone and strength of the thigh muscles and to improve knee motion. Often times heat used for twenty to thirty minutes before working out will loosen up your tissues and help with improving the range of motion but do not use heat for the first two weeks following surgery. These exercises can be done on a training (exercise) mat, on the floor, on a table or on a bed. Use what ever works the best and is most comfortable for you Knee exercises include:  °Leg Lifts - While your knee is still immobilized in a splint or cast, you can do straight leg raises. Lift the leg to 60 degrees, hold for 3 sec, and slowly lower the leg. Repeat 10-20 times 2-3 times daily. Perform this exercise against resistance later as your knee gets better.  °Quad and Hamstring Sets - Tighten up the muscle on the front of the thigh (Quad) and hold for 5-10 sec. Repeat this 10-20 times hourly. Hamstring sets are done by pushing the  foot backward against an object and holding for 5-10 sec. Repeat as with quad sets.  °· Leg Slides: Lying on your back, slowly slide your foot toward your buttocks, bending your knee up off the floor (only go as far as is comfortable). Then slowly slide your foot back down until your leg is flat on the floor again. °· Angel Wings: Lying on your back spread your legs to the side as far apart as you can without causing discomfort.  °A rehabilitation program following serious knee injuries can speed recovery and prevent re-injury in the future due to weakened muscles. Contact your doctor or a physical therapist for more information on knee rehabilitation.  ° °IF YOU ARE TRANSFERRED TO A SKILLED REHAB FACILITY °If the patient is transferred to a skilled rehab facility following release from the hospital, a list of the current medications will be sent to the facility for the patient to continue.  When discharged from the skilled rehab facility, please have the facility set up the patient's Home Health Physical Therapy prior to being released. Also, the skilled facility will be responsible for providing the patient with their medications at time of release from the facility to include their pain medication, the muscle relaxants, and their blood thinner medication. If the patient is still at the rehab facility at time of the two week follow up appointment, the skilled rehab facility will also need to assist the patient in arranging follow up appointment in our office and any transportation needs. ° °MAKE SURE YOU:  °Understand these instructions.  °Get help right away if you are not doing well or get worse.  ° ° °Pick up stool softner and laxative for home use following surgery while on pain medications. °Do not submerge incision under water. °Please use good hand washing techniques while changing dressing each day. °May shower starting three days after surgery. °Please use a clean towel to pat the incision dry following  showers. °Continue to use ice for pain and swelling after surgery. °Do not use any lotions or creams on the incision until instructed by your surgeon. ° °Take Xarelto for two and a half more weeks, then discontinue Xarelto. °Once the patient has completed the blood thinner regimen, then take a Baby 81 mg Aspirin daily for three more weeks. ° °Information on   my medicine - XARELTO® (Rivaroxaban) ° °This medication education was reviewed with me or my healthcare representative as part of my discharge preparation.  The pharmacist that spoke with me during my hospital stay was:  Michael D Li ° °Why was Xarelto® prescribed for you? °Xarelto® was prescribed for you to reduce the risk of blood clots forming after orthopedic surgery. The medical term for these abnormal blood clots is venous thromboembolism (VTE). ° °What do you need to know about xarelto® ? °Take your Xarelto® ONCE DAILY at the same time every day. °You may take it either with or without food. ° °If you have difficulty swallowing the tablet whole, you may crush it and mix in applesauce just prior to taking your dose. ° °Take Xarelto® exactly as prescribed by your doctor and DO NOT stop taking Xarelto® without talking to the doctor who prescribed the medication.  Stopping without other VTE prevention medication to take the place of Xarelto® may increase your risk of developing a clot. ° °After discharge, you should have regular check-up appointments with your healthcare provider that is prescribing your Xarelto®.   ° °What do you do if you miss a dose? °If you miss a dose, take it as soon as you remember on the same day then continue your regularly scheduled once daily regimen the next day. Do not take two doses of Xarelto® on the same day.  ° °Important Safety Information °A possible side effect of Xarelto® is bleeding. You should call your healthcare provider right away if you experience any of the following: °? Bleeding from an injury or your nose that  does not stop. °? Unusual colored urine (red or dark brown) or unusual colored stools (red or black). °? Unusual bruising for unknown reasons. °? A serious fall or if you hit your head (even if there is no bleeding). ° °Some medicines may interact with Xarelto® and might increase your risk of bleeding while on Xarelto®. To help avoid this, consult your healthcare provider or pharmacist prior to using any new prescription or non-prescription medications, including herbals, vitamins, non-steroidal anti-inflammatory drugs (NSAIDs) and supplements. ° °This website has more information on Xarelto®: www.xarelto.com. ° ° ° °

## 2016-04-05 NOTE — Care Management Note (Signed)
Case Management Note  Patient Details  Name: AMYRI FRENZ MRN: [id number] Date of Birth: October 27, 1950  Subjective/Objective:                  LEFT TOTAL KNEE ARTHROPLASTY (Left) Action/Plan: Discharge planning Expected Discharge Date:04/05/16                  Expected Discharge Plan:  Home/Self Care  In-House Referral:     Discharge planning Services  CM Consult  Post Acute Care Choice:  NA Choice offered to:  Patient  DME Arranged:  N/A DME Agency:  NA  HH Arranged:  NA HH Agency:  NA, Other - See comment  Status of Service:  Completed, signed off  If discussed at [place] of Stay Meetings, dates discussed:    Additional Comments: CM met with pt in room to discuss disposition.  Pt states she has Virtual PT and has all DME needed at home.  No other CM needs were communicated. Dellie Catholic, RN 04/05/2016, 12:30 PM

## 2016-04-06 LAB — BASIC METABOLIC PANEL
ANION GAP: 6 (ref 5–15)
BUN: 22 mg/dL — AB (ref 6–20)
CALCIUM: 8.7 mg/dL — AB (ref 8.9–10.3)
CO2: 25 mmol/L (ref 22–32)
Chloride: 109 mmol/L (ref 101–111)
Creatinine, Ser: 0.72 mg/dL (ref 0.44–1.00)
GFR calc Af Amer: >60 mL/min (ref >60)
GLUCOSE: 103 mg/dL — AB (ref 65–99)
Potassium: 4 mmol/L (ref 3.5–5.1)
Sodium: 140 mmol/L (ref 135–145)

## 2016-04-06 LAB — CBC
HEMATOCRIT: 35.2 % — AB (ref 36.0–46.0)
Hemoglobin: 11.8 g/dL — ABNORMAL LOW (ref 12.0–15.0)
MCH: 30.6 pg (ref 26.0–34.0)
MCHC: 33.5 g/dL (ref 30.0–36.0)
MCV: 91.4 fL (ref 78.0–100.0)
PLATELETS: 158 10*3/uL (ref 150–400)
RBC: 3.85 MIL/uL — ABNORMAL LOW (ref 3.87–5.11)
RDW: 13.5 % (ref 11.5–15.5)
WBC: 13.6 10*3/uL — ABNORMAL HIGH (ref 4.0–10.5)

## 2016-04-06 NOTE — Progress Notes (Signed)
Occupational Therapy Treatment Patient Details Name: [name] MRN: [id number] DOB: [birth year] Today's Date: 04/06/2016    History of present illness Pt is a 65 year old female s/p L TKA   OT comments  All OT education completed and pt questions answered. No further OT needs identified at this time. Will sign off.  Follow Up Recommendations  No OT follow up    Equipment Recommendations  3 in 1 bedside comode    Recommendations for Other Services      Precautions / Restrictions Precautions Precautions: Fall;Knee Required Braces or Orthoses: Knee Immobilizer - Left Restrictions Weight Bearing Restrictions: No Other Position/Activity Restrictions: WBAT       Mobility Bed Mobility Overal bed mobility: Needs Assistance Bed Mobility: Supine to Sit     Supine to sit: Supervision;HOB elevated     General bed mobility comments: pt able to self assist LLE out of bed  Transfers Overall transfer level: Needs assistance Equipment used: Rolling walker (2 wheeled) Transfers: Sit to/from Stand Sit to Stand: Supervision         General transfer comment: verbal cues L LE positioning    Balance                                   ADL Overall ADL's : Needs assistance/impaired Eating/Feeding: Independent;Sitting   Grooming: Wash/dry hands;Supervision/safety;Standing                   Toilet Transfer: Supervision/safety;Ambulation;BSC;RW   Toileting- Clothing Manipulation and Hygiene: Supervision/safety;Sit to/from stand   Tub/ Shower Transfer: Walk-in shower;Supervision/safety;Ambulation;Shower seat;Rolling walker   Functional mobility during ADLs: Supervision/safety;Rolling walker        Vision                     Perception     Praxis      Cognition   Behavior During Therapy: WFL for tasks assessed/performed Overall Cognitive Status: Within Functional Limits for tasks assessed                        Extremity/Trunk Assessment               Exercises     Shoulder Instructions       General Comments      Pertinent Vitals/ Pain       Pain Assessment: 0-10 Pain Score: 5  Pain Location: L knee Pain Descriptors / Indicators: Aching;Sore Pain Intervention(s): Monitored during session;Repositioned;Patient requesting pain meds-RN notified  Home Living                                          Prior Functioning/Environment              Frequency       Progress Toward Goals  OT Goals(current goals can now be found in the care plan section)  Progress towards OT goals: Goals met/education completed, patient discharged from [facility] All goals met and education completed, patient discharged from OT services    Co-evaluation                 End of Session Equipment Utilized During Treatment: Rolling walker;Left knee immobilizer   Activity Tolerance Patient tolerated treatment well   Patient Left in chair;with call  bell/phone within reach;with chair alarm set   Nurse Communication Patient requests pain meds        Time: [phone] OT Time Calculation (min): 17 min  Charges: OT General Charges $OT Visit: 1 Procedure OT Treatments $Self Care/Home Management : 8-22 mins  Loriene Taunton A 04/06/2016, 8:28 AM

## 2016-04-06 NOTE — Progress Notes (Signed)
   Subjective: 2 Days Post-Op Procedure(s) (LRB): LEFT TOTAL KNEE ARTHROPLASTY (Left) Patient reports pain as mild.   Patient seen in rounds for Dr. Lequita HaltAluisio. Patient is well, but has had some minor complaints of pain in the knee, requiring pain medications Patient is ready to go home  Objective: Vital signs in last 24 hours: Temp:  [97.7 F (36.5 C)-98.2 F (36.8 C)] 98 F (36.7 C) (08/16 0549) Pulse Rate:  [52-57] 57 (08/16 0549) Resp:  [16-18] 16 (08/16 0549) BP: (112-143)/(51-71) 123/65 (08/16 0549) SpO2:  [100 %] 100 % (08/16 0549)  Intake/Output from previous day:  Intake/Output Summary (Last 24 hours) at 04/06/16 0800 Last data filed at 04/06/16 0550  Gross per 24 hour  Intake             1080 ml  Output              250 ml  Net              830 ml    Intake/Output this shift: No intake/output data recorded.  Labs:  Recent Labs  04/05/16 0428 04/06/16 0450  HGB 11.9* 11.8*    Recent Labs  04/05/16 0428 04/06/16 0450  WBC 12.4* 13.6*  RBC 3.82* 3.85*  HCT 35.2* 35.2*  PLT 169 158    Recent Labs  04/05/16 0428 04/06/16 0450  NA 138 140  K 4.2 4.0  CL 107 109  CO2 25 25  BUN 20 22*  CREATININE 0.81 0.72  GLUCOSE 136* 103*  CALCIUM 8.5* 8.7*   No results for input(s): LABPT, INR in the last 72 hours.  EXAM: General - Patient is Alert, Appropriate and Oriented Extremity - Neurovascular intact Sensation intact distally Dorsiflexion/Plantar flexion intact Incision - clean, dry, no drainage Motor Function - intact, moving foot and toes well on exam.   Assessment/Plan: 2 Days Post-Op Procedure(s) (LRB): LEFT TOTAL KNEE ARTHROPLASTY (Left) Procedure(s) (LRB): LEFT TOTAL KNEE ARTHROPLASTY (Left) Past Medical History:  Diagnosis Date  . Arthritis   . CHF (congestive heart failure) (HCC)   . Essential hypertension 10/15/2015  . Heart murmur   . Hypertension   . Hypothyroidism 10/15/2015   Principal Problem:   OA (osteoarthritis) of  knee  Estimated body mass index is 34.77 kg/m as calculated from the following:   Height as of this encounter: 5\' 7"  (1.702 m).   Weight as of this encounter: 100.7 kg (222 lb). Up with therapy Diet - Cardiac diet Follow up - in 2 weeks Activity - WBAT Disposition - Home Condition Upon Discharge - Good D/C Meds - See DC Summary DVT Prophylaxis - Xarelto  Avel Peacerew Hubert Raatz, PA-C Orthopaedic Surgery 04/06/2016, 8:00 AM

## 2016-04-06 NOTE — Progress Notes (Signed)
Physical Therapy Treatment Patient Details Name: [name] MRN: [id number] DOB: 08/29/1950 Today's Date: 04/06/2016    History of Present Illness Pt is a 65 year old female s/p L TKA    PT Comments    Pt ambulated in hallway and practiced LE exercises.   All pt's questions answered and pt feels ready for d/c home.  Follow Up Recommendations  Home health PT (virtual PT)     Equipment Recommendations  None recommended by PT    Recommendations for Other Services       Precautions / Restrictions Precautions Precautions: Fall;Knee Required Braces or Orthoses: Knee Immobilizer - Left Restrictions Other Position/Activity Restrictions: WBAT    Mobility  Bed Mobility               General bed mobility comments: pt up in recliner on arrival  Transfers Overall transfer level: Needs assistance Equipment used: Rolling walker (2 wheeled) Transfers: Sit to/from Stand Sit to Stand: Supervision         General transfer comment: verbal cues L LE positioning  Ambulation/Gait Ambulation/Gait assistance: Supervision Ambulation Distance (Feet): 100 Feet Assistive device: Rolling walker (2 wheeled) Gait Pattern/deviations: Step-through pattern;Decreased stance time - left;Trunk flexed Gait velocity: 59 sec for 10 m   General Gait Details: verbal cues for sequence, RW positioning, posture, step length   Stairs            Wheelchair Mobility    Modified Rankin (Stroke Patients Only)       Balance                                    Cognition Arousal/Alertness: Awake/alert Behavior During Therapy: WFL for tasks assessed/performed Overall Cognitive Status: Within Functional Limits for tasks assessed                      Exercises Total Joint Exercises Ankle Circles/Pumps: AROM;Both;10 reps Quad Sets: AROM;Both;10 reps Short Arc QuadBarbaraann Boys: AAROM;Both;10 reps Heel Slides: AAROM;Seated;10 reps;Left Hip ABduction/ADduction:  AAROM;Left;10 reps Straight Leg Raises: AAROM;Left;10 reps    General Comments        Pertinent Vitals/Pain Pain Assessment: 0-10 Pain Score: 5  Pain Location: L knee Pain Descriptors / Indicators: Aching;Sore Pain Intervention(s): Monitored during session;Repositioned;Premedicated before session;Ice applied;Limited activity within patient's tolerance    Home Living                      Prior Function            PT Goals (current goals can now be found in the care plan section) Progress towards PT goals: Progressing toward goals    Frequency  7X/week    PT Plan Current plan remains appropriate    Co-evaluation             End of Session Equipment Utilized During Treatment: Left knee immobilizer Activity Tolerance: Patient tolerated treatment well Patient left: with call bell/phone within reach;in chair     Time: [id number] PT Time Calculation (min) (ACUTE ONLY): 20 min  Charges:  $Gait Training: 8-22 mins                    G Codes:      Alexyia Guarino,KATHrine E 04/06/2016, 12:27 PM Zenovia JarredKati Mariany Mackintosh, PT, DPT 04/06/2016 Pager: [phone]

## 2016-04-08 LAB — TYPE AND SCREEN
ABO/RH(D): B NEG
ANTIBODY SCREEN: POSITIVE
DAT, IgG: NEGATIVE
DONOR AG TYPE: NEGATIVE
UNIT DIVISION: 0

## 2016-04-19 ENCOUNTER — Other Ambulatory Visit: Payer: Self-pay

## 2016-04-19 DIAGNOSIS — Z471 Aftercare following joint replacement surgery: Secondary | ICD-10-CM | POA: Diagnosis not present

## 2016-04-19 DIAGNOSIS — Z96652 Presence of left artificial knee joint: Secondary | ICD-10-CM | POA: Diagnosis not present

## 2016-05-10 DIAGNOSIS — Z471 Aftercare following joint replacement surgery: Secondary | ICD-10-CM | POA: Diagnosis not present

## 2016-05-10 DIAGNOSIS — M1711 Unilateral primary osteoarthritis, right knee: Secondary | ICD-10-CM | POA: Diagnosis not present

## 2016-05-10 DIAGNOSIS — Z96652 Presence of left artificial knee joint: Secondary | ICD-10-CM | POA: Diagnosis not present

## 2016-06-02 ENCOUNTER — Ambulatory Visit: Payer: Self-pay | Admitting: Orthopedic Surgery

## 2016-06-02 DIAGNOSIS — Z23 Encounter for immunization: Secondary | ICD-10-CM | POA: Diagnosis not present

## 2016-06-16 DIAGNOSIS — M1711 Unilateral primary osteoarthritis, right knee: Secondary | ICD-10-CM | POA: Diagnosis not present

## 2016-06-16 DIAGNOSIS — Z471 Aftercare following joint replacement surgery: Secondary | ICD-10-CM | POA: Diagnosis not present

## 2016-06-16 DIAGNOSIS — Z96652 Presence of left artificial knee joint: Secondary | ICD-10-CM | POA: Diagnosis not present

## 2016-07-19 DIAGNOSIS — M1711 Unilateral primary osteoarthritis, right knee: Secondary | ICD-10-CM | POA: Diagnosis not present

## 2016-07-19 DIAGNOSIS — Z96652 Presence of left artificial knee joint: Secondary | ICD-10-CM | POA: Diagnosis not present

## 2016-07-19 DIAGNOSIS — M170 Bilateral primary osteoarthritis of knee: Secondary | ICD-10-CM | POA: Diagnosis not present

## 2016-07-27 ENCOUNTER — Encounter (HOSPITAL_COMMUNITY)
Admission: RE | Admit: 2016-07-27 | Discharge: 2016-07-27 | Disposition: A | Discharge status: Home / Self Care | Payer: Medicare Other | Source: Ambulatory Visit | Attending: Orthopedic Surgery | Admitting: Orthopedic Surgery

## 2016-07-27 ENCOUNTER — Encounter (HOSPITAL_COMMUNITY): Payer: Self-pay

## 2016-07-27 DIAGNOSIS — Z01812 Encounter for preprocedural laboratory examination: Secondary | ICD-10-CM | POA: Diagnosis not present

## 2016-07-27 DIAGNOSIS — M1711 Unilateral primary osteoarthritis, right knee: Secondary | ICD-10-CM | POA: Diagnosis not present

## 2016-07-27 DIAGNOSIS — Z01818 Encounter for other preprocedural examination: Secondary | ICD-10-CM | POA: Diagnosis not present

## 2016-07-27 HISTORY — DX: Personal history of other medical treatment: Z92.89

## 2016-07-27 LAB — COMPREHENSIVE METABOLIC PANEL
ALK PHOS: 91 U/L (ref 38–126)
ALT: 13 U/L — AB (ref 14–54)
AST: 22 U/L (ref 15–41)
Albumin: 4.4 g/dL (ref 3.5–5.0)
Anion gap: 7 (ref 5–15)
BILIRUBIN TOTAL: 1 mg/dL (ref 0.3–1.2)
BUN: 26 mg/dL — AB (ref 6–20)
CALCIUM: 9.6 mg/dL (ref 8.9–10.3)
CO2: 30 mmol/L (ref 22–32)
CREATININE: 0.84 mg/dL (ref 0.44–1.00)
Chloride: 103 mmol/L (ref 101–111)
Glucose, Bld: 87 mg/dL (ref 65–99)
Potassium: 4.2 mmol/L (ref 3.5–5.1)
Sodium: 140 mmol/L (ref 135–145)
Total Protein: 7.8 g/dL (ref 6.5–8.1)

## 2016-07-27 LAB — CBC
HEMATOCRIT: 40.9 % (ref 36.0–46.0)
HEMOGLOBIN: 14 g/dL (ref 12.0–15.0)
MCH: 30.3 pg (ref 26.0–34.0)
MCHC: 34.2 g/dL (ref 30.0–36.0)
MCV: 88.5 fL (ref 78.0–100.0)
PLATELETS: 195 10*3/uL (ref 150–400)
RBC: 4.62 MIL/uL (ref 3.87–5.11)
RDW: 14.3 % (ref 11.5–15.5)
WBC: 6.7 10*3/uL (ref 4.0–10.5)

## 2016-07-27 LAB — URINALYSIS, ROUTINE W REFLEX MICROSCOPIC
Bilirubin Urine: NEGATIVE
GLUCOSE, UA: NEGATIVE mg/dL
HGB URINE DIPSTICK: NEGATIVE
KETONES UR: NEGATIVE mg/dL
Leukocytes, UA: NEGATIVE
Nitrite: NEGATIVE
PH: 7 (ref 5.0–8.0)
PROTEIN: NEGATIVE mg/dL
Specific Gravity, Urine: 1.004 — ABNORMAL LOW (ref 1.005–1.030)

## 2016-07-27 LAB — PROTIME-INR
INR: 1.05
PROTHROMBIN TIME: 13.7 s (ref 11.4–15.2)

## 2016-07-27 LAB — SURGICAL PCR SCREEN
MRSA, PCR: NEGATIVE
Staphylococcus aureus: POSITIVE — AB

## 2016-07-27 LAB — APTT: aPTT: 30 seconds (ref 24–36)

## 2016-07-27 NOTE — Progress Notes (Signed)
07-27-16 Positive Staph aureus- pt to use Mupirocin ointment as directed . Note to Dr. Deri Fuellingaluisio's office [phone].

## 2016-07-27 NOTE — Patient Instructions (Addendum)
Christine SleetGwendolyn [name]  07/27/2016   Your procedure is scheduled on: 08-01-16  Report to [facility]  Entrance take [facility]  elevators to 3rd floor to  Short Stay Center at   [phone].  Call this number if you have problems the morning of surgery [phone]   Remember: ONLY 1 PERSON MAY GO WITH YOU TO SHORT STAY TO GET  READY MORNING OF YOUR SURGERY.  Do not eat food or drink liquids :After Midnight.     Take these medicines the morning of surgery with A SIP OF WATER: Levothyroxine. Metoprolol. Tylenol-if need, DO NOT TAKE ANY DIABETIC MEDICATIONS DAY OF YOUR SURGERY                               You may not have any metal on your body including hair pins and              piercings  Do not wear jewelry, make-up, lotions, powders or perfumes, deodorant             Do not wear nail polish.  Do not shave  48 hours prior to surgery.              Men may shave face and neck.   Do not bring valuables to the hospital. [facility] IS NOT             RESPONSIBLE   FOR VALUABLES.  Contacts, dentures or bridgework may not be worn into surgery.  Leave suitcase in the car. After surgery it may be brought to your room.     Patients discharged the day of surgery will not be allowed to drive home.  Name and phone number of your driver: Jillyn HiddenGary -spouse [phone]  Special Instructions: N/A              Please read over the following fact sheets you were given: _____________________________________________________________________             [facility] - Preparing for Surgery Before surgery, you can play an important role.  Because skin is not sterile, your skin needs to be as free of germs as possible.  You can reduce the number of germs on your skin by washing with CHG (chlorahexidine gluconate) soap before surgery.  CHG is an antiseptic cleaner which kills germs and bonds with the skin to continue killing germs even after washing. Please DO NOT use if you have an allergy  to CHG or antibacterial soaps.  If your skin becomes reddened/irritated stop using the CHG and inform your nurse when you arrive at Short Stay. Do not shave (including legs and underarms) for at least 48 hours prior to the first CHG shower.  You may shave your face/neck. Please follow these instructions carefully:  1.  Shower with CHG Soap the night before surgery and the  morning of Surgery.  2.  If you choose to wash your hair, wash your hair first as usual with your  normal  shampoo.  3.  After you shampoo, rinse your hair and body thoroughly to remove the  shampoo.                           4.  Use CHG as you would any other liquid soap.  You can apply chg directly  to the skin and wash                       Gently with a scrungie or clean washcloth.  5.  Apply the CHG Soap to your body ONLY FROM THE NECK DOWN.   Do not use on face/ open                           Wound or open sores. Avoid contact with eyes, ears mouth and genitals (private parts).                       Wash face,  Genitals (private parts) with your normal soap.             6.  Wash thoroughly, paying special attention to the area where your surgery  will be performed.  7.  Thoroughly rinse your body with warm water from the neck down.  8.  DO NOT shower/wash with your normal soap after using and rinsing off  the CHG Soap.                9.  Pat yourself dry with a clean towel.            10.  Wear clean pajamas.            11.  Place clean sheets on your bed the night of your first shower and do not  sleep with pets. Day of Surgery : Do not apply any lotions/deodorants the morning of surgery.  Please wear clean clothes to the hospital/surgery center.  FAILURE TO FOLLOW THESE INSTRUCTIONS MAY RESULT IN THE CANCELLATION OF YOUR SURGERY PATIENT SIGNATURE_________________________________  NURSE SIGNATURE__________________________________  ________________________________________________________________________   Adam Phenix  An incentive spirometer is a tool that can help keep your lungs clear and active. This tool measures how well you are filling your lungs with each breath. Taking long deep breaths may help reverse or decrease the chance of developing breathing (pulmonary) problems (especially infection) following:  A long period of time when you are unable to move or be active. BEFORE THE PROCEDURE   If the spirometer includes an indicator to show your best effort, your nurse or respiratory therapist will set it to a desired goal.  If possible, sit up straight or lean slightly forward. Try not to slouch.  Hold the incentive spirometer in an upright position. INSTRUCTIONS FOR USE  1. Sit on the edge of your bed if possible, or sit up as far as you can in bed or on a chair. 2. Hold the incentive spirometer in an upright position. 3. Breathe out normally. 4. Place the mouthpiece in your mouth and seal your lips tightly around it. 5. Breathe in slowly and as deeply as possible, raising the piston or the ball toward the top of the column. 6. Hold your breath for 3-5 seconds or for as long as possible. Allow the piston or ball to fall to the bottom of the column. 7. Remove the mouthpiece from your mouth and breathe out normally. 8. Rest for a few seconds and repeat Steps 1 through 7 at least 10 times every 1-2 hours when you are awake. Take your time and take a few normal breaths between deep breaths. 9. The spirometer may include an indicator to show your best effort. Use the indicator as a goal to work toward during each repetition. 10. After each  set of 10 deep breaths, practice coughing to be sure your lungs are clear. If you have an incision (the cut made at the time of surgery), support your incision when coughing by placing a pillow or rolled up towels firmly against it. Once you are able to get out of bed, walk around indoors and cough well. You may stop using the incentive spirometer when  instructed by your caregiver.  RISKS AND COMPLICATIONS  Take your time so you do not get dizzy or light-headed.  If you are in pain, you may need to take or ask for pain medication before doing incentive spirometry. It is harder to take a deep breath if you are having pain. AFTER USE  Rest and breathe slowly and easily.  It can be helpful to keep track of a log of your progress. Your caregiver can provide you with a simple table to help with this. If you are using the spirometer at home, follow these instructions: [place] IF:   You are having difficultly using the spirometer.  You have trouble using the spirometer as often as instructed.  Your pain medication is not giving enough relief while using the spirometer.  You develop fever of 100.5 F (38.1 C) or higher. SEEK IMMEDIATE MEDICAL CARE IF:   You cough up bloody sputum that had not been present before.  You develop fever of 102 F (38.9 C) or greater.  You develop worsening pain at or near the incision site. MAKE SURE YOU:   Understand these instructions.  Will watch your condition.  Will get help right away if you are not doing well or get worse. Document Released: 12/19/2006 Document Revised: 10/31/2011 Document Reviewed: 02/19/2007 ExitCare Patient Information 2014 ExitCare, [place].   ________________________________________________________________________  WHAT IS A BLOOD TRANSFUSION? Blood Transfusion Information  A transfusion is the replacement of blood or some of its parts. Blood is made up of multiple cells which provide different functions.  Red blood cells carry oxygen and are used for blood loss replacement.  White blood cells fight against infection.  Platelets control bleeding.  Plasma helps clot blood.  Other blood products are available for specialized needs, such as hemophilia or other clotting disorders. BEFORE THE TRANSFUSION  Who gives blood for transfusions?   Healthy  volunteers who are fully evaluated to make sure their blood is safe. This is blood bank blood. Transfusion therapy is the safest it has ever been in the practice of medicine. Before blood is taken from a donor, a complete history is taken to make sure that person has no history of diseases nor engages in risky social behavior (examples are intravenous drug use or sexual activity with multiple partners). The donor's travel history is screened to minimize risk of transmitting infections, such as malaria. The donated blood is tested for signs of infectious diseases, such as HIV and hepatitis. The blood is then tested to be sure it is compatible with you in order to minimize the chance of a transfusion reaction. If you or a relative donates blood, this is often done in anticipation of surgery and is not appropriate for emergency situations. It takes many days to process the donated blood. RISKS AND COMPLICATIONS Although transfusion therapy is very safe and saves many lives, the main dangers of transfusion include:   Getting an infectious disease.  Developing a transfusion reaction. This is an allergic reaction to something in the blood you were given. Every precaution is taken to prevent this. The decision to have  a blood transfusion has been considered carefully by your caregiver before blood is given. Blood is not given unless the benefits outweigh the risks. AFTER THE TRANSFUSION  Right after receiving a blood transfusion, you will usually feel much better and more energetic. This is especially true if your red blood cells have gotten low (anemic). The transfusion raises the level of the red blood cells which carry oxygen, and this usually causes an energy increase.  The nurse administering the transfusion will monitor you carefully for complications. HOME CARE INSTRUCTIONS  No special instructions are needed after a transfusion. You may find your energy is better. Speak with your caregiver about any  limitations on activity for underlying diseases you may have. SEEK MEDICAL CARE IF:   Your condition is not improving after your transfusion.  You develop redness or irritation at the intravenous (IV) site. SEEK IMMEDIATE MEDICAL CARE IF:  Any of the following symptoms occur over the next 12 hours:  Shaking chills.  You have a temperature by mouth above 102 F (38.9 C), not controlled by medicine.  Chest, back, or muscle pain.  People around you feel you are not acting correctly or are confused.  Shortness of breath or difficulty breathing.  Dizziness and fainting.  You get a rash or develop hives.  You have a decrease in urine output.  Your urine turns a dark color or changes to pink, red, or brown. Any of the following symptoms occur over the next 10 days:  You have a temperature by mouth above 102 F (38.9 C), not controlled by medicine.  Shortness of breath.  Weakness after normal activity.  The white part of the eye turns yellow (jaundice).  You have a decrease in the amount of urine or are urinating less often.  Your urine turns a dark color or changes to pink, red, or brown. Document Released: 08/05/2000 Document Revised: 10/31/2011 Document Reviewed: 03/24/2008 [facility] Patient Information 2014 [place], [place].  _______________________________________________________________________

## 2016-07-27 NOTE — Pre-Procedure Instructions (Signed)
EKG, CXR 2'17, Echo 3'17 Epic.

## 2016-07-31 ENCOUNTER — Ambulatory Visit: Payer: Self-pay | Admitting: Orthopedic Surgery

## 2016-07-31 NOTE — H&P (Signed)
Christine SleetGwendolyn [name] DOB: 03/12/1951 Married / Language: English / Race: White Female Date of Admission:  08/01/2016 CC:  Right Knee Pain History of Present Illness The patient is a 65 year old female who comes in for a preoperative History and Physical. The patient is scheduled for a right total knee arthroplasty to be performed by Dr. Gus RankinFrank V. Aluisio, MD at [facility] on 08-01-2016. The patient is a 65 year old female who is now several months out from the left total knee arthroplasty. The patient states that she is doing well at this time. The pain is under excellent control at this time and describe their pain as mild. They are currently on Tylenol (Arthritis strength) for their pain. The patient is currently doing home exercise program. The patient feels that they are progressing well at this time. The right knee continues to be problematic. She has done well with the left knee so it is felt that she would be a good candidate to proceed with the right side at this time. They have been treated conservatively in the past for the above stated problem and despite conservative measures, they continue to have progressive pain and severe functional limitations and dysfunction. They have failed non-operative management including home exercise, medications, and injections. It is felt that they would benefit from undergoing total joint replacement. Risks and benefits of the procedure have been discussed with the patient and they elect to proceed with surgery. There are no active contraindications to surgery such as ongoing infection or rapidly progressive neurological disease.   Problem List/Past Medical Status post total left knee replacement (Z61.096(Z96.652)  High blood pressure  Congestive Heart Failure  Heart murmur  Hypothyroidism  Osteoarthritis  Menopause  Primary osteoarthritis of left knee (M17.12)  Varicose veins  Allergies Augmentin *PENICILLINS*  Hands itching and break  out   Family History Heart Disease  Father, Paternal Grandfather. Osteoarthritis  Mother. Rheumatoid Arthritis  Mother.  Social History  Children  2 Current work status  retired Scientist, physiologicalxercise  Exercises never Former drinker  04/22/2015: In the past drank wine only occasionally per week Living situation  live with spouse Marital status  married No history of drug/alcohol rehab  Not under pain contract  Number of flights of stairs before winded  1 Tobacco / smoke exposure  04/22/2015: no Tobacco use  Former smoker. 04/22/2015: smoke(d) 1 pack(s) per day Advance Directives  Living Will, Healthcare POA  Medication History Tylenol (Oral) Specific strength unknown - Active. Vitamin D (Oral) Specific strength unknown - Active. Metoprolol Tartrate (100MG  Tablet, Oral two times daily) Active. Levothyroxine Sodium (75MCG Tablet, Oral) Active. Furosemide (20MG  Tablet, Oral) Active.  Past Surgical History Dilation and Curettage of Uterus - Multiple  1994, 09/2009 Gallbladder Surgery  Date: 2005. laporoscopic Hernia Repair  Date: 09/2015. Umbilical Hernia Total Knee Replacement - Left  Date: 03/2016.    Review of Systems General Not Present- Chills, Fatigue, Fever, Memory Loss, Night Sweats, Weight Gain and Weight Loss. Skin Not Present- Eczema, Hives, Itching, Lesions and Rash. HEENT Not Present- Dentures, Double Vision, Headache, Hearing Loss, Tinnitus and Visual Loss. Respiratory Not Present- Allergies, Chronic Cough, Coughing up blood, Shortness of breath at rest and Shortness of breath with exertion. Cardiovascular Not Present- Chest Pain, Difficulty Breathing Lying Down, Murmur, Palpitations, Racing/skipping heartbeats and Swelling. Gastrointestinal Not Present- Abdominal Pain, Bloody Stool, Constipation, Diarrhea, Difficulty Swallowing, Heartburn, Jaundice, Loss of appetitie, Nausea and Vomiting. Female Genitourinary Not Present- Blood in Urine, Discharge, Flank  Pain, Incontinence, Painful  Urination, Urgency, Urinary frequency, Urinary Retention, Urinating at Night and Weak urinary stream. Musculoskeletal Present- Back Pain and Joint Pain. Not Present- Joint Swelling, Morning Stiffness, Muscle Pain, Muscle Weakness and Spasms. Neurological Not Present- Blackout spells, Difficulty with balance, Dizziness, Paralysis, Tremor and Weakness. Psychiatric Not Present- Insomnia.  Vitals  Weight: 220 lb Height: 66in Body Surface Area: 2.08 m Body Mass Index: 35.51 kg/m  Pulse: 52 (Regular)  BP: 128/78 (Sitting, Right Arm, Standard)   Physical Exam General Mental Status -Alert, cooperative and good historian. General Appearance-pleasant, Not in acute distress. Orientation-Oriented X3. Build & Nutrition-Well nourished and Well developed.  Head and Neck Head-normocephalic, atraumatic . Neck Global Assessment - supple, no bruit auscultated on the right, no bruit auscultated on the left.  Eye Pupil - Bilateral-Regular and Round. Motion - Bilateral-EOMI.  Chest and Lung Exam Auscultation Breath sounds - clear at anterior chest wall and clear at posterior chest wall. Adventitious sounds - No Adventitious sounds.  Cardiovascular Auscultation Rhythm - Regular rate and rhythm. Heart Sounds - S1 WNL and S2 WNL. Murmurs & Other Heart Sounds: Murmur 1 - Location - Aortic Area. Timing - Mid-systolic. Grade - II/VI.  Abdomen Palpation/Percussion Tenderness - Abdomen is non-tender to palpation. Rigidity (guarding) - Abdomen is soft. Auscultation Auscultation of the abdomen reveals - Bowel sounds normal.  Female Genitourinary Note: Not done, not pertinent to present illness   Musculoskeletal Note: Well developed female, in no distress. Left knee looks great. There is minimal swelling. Range 0 to 122 degrees with no instability.   Assessment & Plan  Primary osteoarthritis of right knee (M17.11)  Status post total left  knee replacement (W09.811(Z96.652)  Note:Surgical Plans: Right Total Knee Replacement  Disposition: Home  PCP: Dr. Ronne BinningMcKenzie  IV TXA  Anesthesia Issues: None  Signed electronically by Beckey RutterAlezandrew L Perkins, III PA-C

## 2016-07-31 NOTE — Anesthesia Preprocedure Evaluation (Addendum)
Anesthesia Evaluation  Patient identified by MRN, date of birth, ID band Patient awake    Reviewed: Allergy & Precautions, H&P , NPO status , Patient's Chart, lab work & pertinent test results, reviewed documented beta blocker date and time   Airway Mallampati: II  TM Distance: >3 FB Neck ROM: Full    Dental no notable dental hx. (+) Teeth Intact, Dental Advisory Given   Pulmonary neg pulmonary ROS, former smoker,    Pulmonary exam normal breath sounds clear to auscultation       Cardiovascular hypertension, Pt. on medications and Pt. on home beta blockers +CHF   Rhythm:Regular Rate:Normal     Neuro/Psych negative neurological ROS  negative psych ROS   GI/Hepatic negative GI ROS, Neg liver ROS,   Endo/Other  Hypothyroidism   Renal/GU negative Renal ROS  negative genitourinary   Musculoskeletal  (+) Arthritis , Osteoarthritis,    Abdominal   Peds  Hematology negative hematology ROS (+)   Anesthesia Other Findings   Reproductive/Obstetrics negative OB ROS                           Anesthesia Physical Anesthesia Plan  ASA: II  Anesthesia Plan: Spinal   Post-op Pain Management:    Induction: Intravenous  Airway Management Planned: Simple Face Mask  Additional Equipment:   Intra-op Plan:   Post-operative Plan:   Informed Consent: I have reviewed the patients History and Physical, chart, labs and discussed the procedure including the risks, benefits and alternatives for the proposed anesthesia with the patient or authorized representative who has indicated his/her understanding and acceptance.   Dental advisory given  Plan Discussed with: CRNA  Anesthesia Plan Comments:         Anesthesia Quick Evaluation

## 2016-08-01 ENCOUNTER — Encounter (HOSPITAL_COMMUNITY): Payer: Self-pay | Admitting: *Deleted

## 2016-08-01 ENCOUNTER — Inpatient Hospital Stay (HOSPITAL_COMMUNITY): Payer: Medicare Other | Admitting: Anesthesiology

## 2016-08-01 ENCOUNTER — Inpatient Hospital Stay (HOSPITAL_COMMUNITY)
Admission: RE | Admit: 2016-08-01 | Discharge: 2016-08-03 | DRG: 470 | Disposition: A | Discharge status: Home / Self Care | Payer: Medicare Other | Source: Ambulatory Visit | Attending: Orthopedic Surgery | Admitting: Orthopedic Surgery

## 2016-08-01 ENCOUNTER — Encounter (HOSPITAL_COMMUNITY)
Admission: RE | Disposition: A | Discharge status: Home / Self Care | Payer: Self-pay | Source: Ambulatory Visit | Attending: Orthopedic Surgery

## 2016-08-01 DIAGNOSIS — Z87891 Personal history of nicotine dependence: Secondary | ICD-10-CM | POA: Diagnosis not present

## 2016-08-01 DIAGNOSIS — I110 Hypertensive heart disease with heart failure: Secondary | ICD-10-CM | POA: Diagnosis present

## 2016-08-01 DIAGNOSIS — Z79899 Other long term (current) drug therapy: Secondary | ICD-10-CM

## 2016-08-01 DIAGNOSIS — E039 Hypothyroidism, unspecified: Secondary | ICD-10-CM | POA: Diagnosis present

## 2016-08-01 DIAGNOSIS — Z96652 Presence of left artificial knee joint: Secondary | ICD-10-CM | POA: Diagnosis not present

## 2016-08-01 DIAGNOSIS — I10 Essential (primary) hypertension: Secondary | ICD-10-CM | POA: Diagnosis not present

## 2016-08-01 DIAGNOSIS — K420 Umbilical hernia with obstruction, without gangrene: Secondary | ICD-10-CM | POA: Diagnosis not present

## 2016-08-01 DIAGNOSIS — I509 Heart failure, unspecified: Secondary | ICD-10-CM | POA: Diagnosis not present

## 2016-08-01 DIAGNOSIS — M25561 Pain in right knee: Secondary | ICD-10-CM | POA: Diagnosis not present

## 2016-08-01 DIAGNOSIS — M1711 Unilateral primary osteoarthritis, right knee: Secondary | ICD-10-CM | POA: Diagnosis not present

## 2016-08-01 DIAGNOSIS — M1710 Unilateral primary osteoarthritis, unspecified knee: Secondary | ICD-10-CM

## 2016-08-01 DIAGNOSIS — M179 Osteoarthritis of knee, unspecified: Secondary | ICD-10-CM | POA: Diagnosis present

## 2016-08-01 HISTORY — PX: TOTAL KNEE ARTHROPLASTY: SHX125

## 2016-08-01 SURGERY — ARTHROPLASTY, KNEE, TOTAL
Anesthesia: Spinal | Site: Knee | Laterality: Right

## 2016-08-01 MED ORDER — LEVOTHYROXINE SODIUM 75 MCG PO TABS
75.0000 ug | ORAL_TABLET | Freq: Every day | ORAL | Status: DC
  Administered 2016-08-02 – 2016-08-03 (×2): 75 ug via ORAL
  Filled 2016-08-01 (×2): qty 1

## 2016-08-01 MED ORDER — SODIUM CHLORIDE 0.9 % IJ SOLN
INTRAMUSCULAR | Status: AC
  Filled 2016-08-01: qty 50

## 2016-08-01 MED ORDER — CHLORHEXIDINE GLUCONATE 4 % EX LIQD: 60.0000 mL | Freq: Once | CUTANEOUS | Status: DC

## 2016-08-01 MED ORDER — MIDAZOLAM HCL 2 MG/2ML IJ SOLN
INTRAMUSCULAR | Status: AC
  Filled 2016-08-01: qty 2

## 2016-08-01 MED ORDER — ACETAMINOPHEN 325 MG PO TABS: 650.0000 mg | ORAL_TABLET | Freq: Four times a day (QID) | ORAL | Status: DC | PRN

## 2016-08-01 MED ORDER — BUPIVACAINE HCL 0.25 % IJ SOLN
INTRAMUSCULAR | Status: DC | PRN
  Administered 2016-08-01: 20 mL

## 2016-08-01 MED ORDER — FENTANYL CITRATE (PF) 100 MCG/2ML IJ SOLN
INTRAMUSCULAR | Status: DC | PRN
  Administered 2016-08-01: 100 ug via INTRAVENOUS

## 2016-08-01 MED ORDER — FUROSEMIDE 40 MG PO TABS
40.0000 mg | ORAL_TABLET | Freq: Every morning | ORAL | Status: DC
  Filled 2016-08-01: qty 1

## 2016-08-01 MED ORDER — BUPIVACAINE LIPOSOME 1.3 % IJ SUSP
INTRAMUSCULAR | Status: DC | PRN
  Administered 2016-08-01: 20 mL

## 2016-08-01 MED ORDER — BUPIVACAINE LIPOSOME 1.3 % IJ SUSP
20.0000 mL | Freq: Once | INTRAMUSCULAR | Status: DC
  Filled 2016-08-01: qty 20

## 2016-08-01 MED ORDER — ACETAMINOPHEN 10 MG/ML IV SOLN
1000.0000 mg | Freq: Once | INTRAVENOUS | Status: AC
  Administered 2016-08-01: 1000 mg via INTRAVENOUS
  Filled 2016-08-01: qty 100

## 2016-08-01 MED ORDER — FENTANYL CITRATE (PF) 100 MCG/2ML IJ SOLN
INTRAMUSCULAR | Status: AC
  Filled 2016-08-01: qty 2

## 2016-08-01 MED ORDER — DOCUSATE SODIUM 100 MG PO CAPS
100.0000 mg | ORAL_CAPSULE | Freq: Two times a day (BID) | ORAL | Status: DC
  Administered 2016-08-01 – 2016-08-03 (×4): 100 mg via ORAL
  Filled 2016-08-01 (×4): qty 1

## 2016-08-01 MED ORDER — BUPIVACAINE HCL (PF) 0.25 % IJ SOLN
INTRAMUSCULAR | Status: AC
  Filled 2016-08-01: qty 30

## 2016-08-01 MED ORDER — HYDROMORPHONE HCL 1 MG/ML IJ SOLN
0.2500 mg | INTRAMUSCULAR | Status: DC | PRN
Start: 2016-08-01 — End: 2016-08-01

## 2016-08-01 MED ORDER — STERILE WATER FOR IRRIGATION IR SOLN
Status: DC | PRN
  Administered 2016-08-01: 2000 mL

## 2016-08-01 MED ORDER — BISACODYL 10 MG RE SUPP: 10.0000 mg | Freq: Every day | RECTAL | Status: DC | PRN

## 2016-08-01 MED ORDER — DEXAMETHASONE SODIUM PHOSPHATE 10 MG/ML IJ SOLN
10.0000 mg | Freq: Once | INTRAMUSCULAR | Status: AC
  Administered 2016-08-01: 10 mg via INTRAVENOUS

## 2016-08-01 MED ORDER — METHOCARBAMOL 500 MG PO TABS
500.0000 mg | ORAL_TABLET | Freq: Four times a day (QID) | ORAL | Status: DC | PRN
  Administered 2016-08-03: 500 mg via ORAL
  Filled 2016-08-01: qty 1

## 2016-08-01 MED ORDER — ONDANSETRON HCL 4 MG PO TABS: 4.0000 mg | ORAL_TABLET | Freq: Four times a day (QID) | ORAL | Status: DC | PRN

## 2016-08-01 MED ORDER — 0.9 % SODIUM CHLORIDE (POUR BTL) OPTIME
TOPICAL | Status: DC | PRN
  Administered 2016-08-01: 1000 mL

## 2016-08-01 MED ORDER — LIDOCAINE 2% (20 MG/ML) 5 ML SYRINGE
INTRAMUSCULAR | Status: DC | PRN
  Administered 2016-08-01: 100 mg via INTRAVENOUS

## 2016-08-01 MED ORDER — DEXAMETHASONE SODIUM PHOSPHATE 10 MG/ML IJ SOLN
10.0000 mg | Freq: Once | INTRAMUSCULAR | Status: AC
  Administered 2016-08-02: 10 mg via INTRAVENOUS
  Filled 2016-08-01: qty 1

## 2016-08-01 MED ORDER — TRANEXAMIC ACID 1000 MG/10ML IV SOLN
1000.0000 mg | INTRAVENOUS | Status: AC
  Administered 2016-08-01: 1000 mg via INTRAVENOUS
  Filled 2016-08-01: qty 10

## 2016-08-01 MED ORDER — EPHEDRINE SULFATE-NACL 50-0.9 MG/10ML-% IV SOSY
PREFILLED_SYRINGE | INTRAVENOUS | Status: DC | PRN
  Administered 2016-08-01 (×2): 5 mg via INTRAVENOUS

## 2016-08-01 MED ORDER — PROPOFOL 10 MG/ML IV BOLUS
INTRAVENOUS | Status: AC
  Filled 2016-08-01: qty 20

## 2016-08-01 MED ORDER — EPHEDRINE 5 MG/ML INJ
INTRAVENOUS | Status: AC
  Filled 2016-08-01: qty 10

## 2016-08-01 MED ORDER — VANCOMYCIN HCL IN DEXTROSE 1-5 GM/200ML-% IV SOLN
1000.0000 mg | INTRAVENOUS | Status: AC
  Administered 2016-08-01: 1000 mg via INTRAVENOUS
  Filled 2016-08-01 (×2): qty 200

## 2016-08-01 MED ORDER — FLEET ENEMA 7-19 GM/118ML RE ENEM
1.0000 | ENEMA | Freq: Once | RECTAL | Status: DC | PRN
Start: 2016-08-01 — End: 2016-08-03

## 2016-08-01 MED ORDER — ONDANSETRON HCL 4 MG/2ML IJ SOLN: 4.0000 mg | Freq: Four times a day (QID) | INTRAMUSCULAR | Status: DC | PRN

## 2016-08-01 MED ORDER — SODIUM CHLORIDE 0.9 % IJ SOLN
INTRAMUSCULAR | Status: DC | PRN
  Administered 2016-08-01: 30 mL

## 2016-08-01 MED ORDER — RIVAROXABAN 10 MG PO TABS
10.0000 mg | ORAL_TABLET | Freq: Every day | ORAL | Status: DC
  Administered 2016-08-02 – 2016-08-03 (×2): 10 mg via ORAL
  Filled 2016-08-01 (×2): qty 1

## 2016-08-01 MED ORDER — MORPHINE SULFATE (PF) 2 MG/ML IV SOLN
1.0000 mg | INTRAVENOUS | Status: DC | PRN
  Administered 2016-08-01 (×2): 1 mg via INTRAVENOUS
  Filled 2016-08-01 (×2): qty 1

## 2016-08-01 MED ORDER — LACTATED RINGERS IV SOLN
INTRAVENOUS | Status: DC
  Administered 2016-08-01: 08:00:00 via INTRAVENOUS

## 2016-08-01 MED ORDER — ACETAMINOPHEN 650 MG RE SUPP: 650.0000 mg | Freq: Four times a day (QID) | RECTAL | Status: DC | PRN

## 2016-08-01 MED ORDER — METOCLOPRAMIDE HCL 5 MG PO TABS: 5.0000 mg | ORAL_TABLET | Freq: Three times a day (TID) | ORAL | Status: DC | PRN

## 2016-08-01 MED ORDER — PROPOFOL 500 MG/50ML IV EMUL
INTRAVENOUS | Status: DC | PRN
  Administered 2016-08-01: 40 ug/kg/min via INTRAVENOUS

## 2016-08-01 MED ORDER — TRANEXAMIC ACID 1000 MG/10ML IV SOLN
1000.0000 mg | Freq: Once | INTRAVENOUS | Status: AC
  Administered 2016-08-01: 1000 mg via INTRAVENOUS
  Filled 2016-08-01: qty 10

## 2016-08-01 MED ORDER — POLYETHYLENE GLYCOL 3350 17 G PO PACK: 17.0000 g | PACK | Freq: Every day | ORAL | Status: DC | PRN

## 2016-08-01 MED ORDER — SODIUM CHLORIDE 0.9 % IV SOLN
INTRAVENOUS | Status: DC
  Administered 2016-08-01: 17:00:00 via INTRAVENOUS

## 2016-08-01 MED ORDER — ONDANSETRON HCL 4 MG/2ML IJ SOLN
INTRAMUSCULAR | Status: AC
  Filled 2016-08-01: qty 2

## 2016-08-01 MED ORDER — MIDAZOLAM HCL 5 MG/5ML IJ SOLN
INTRAMUSCULAR | Status: DC | PRN
  Administered 2016-08-01: 2 mg via INTRAVENOUS

## 2016-08-01 MED ORDER — METOPROLOL TARTRATE 50 MG PO TABS
100.0000 mg | ORAL_TABLET | Freq: Two times a day (BID) | ORAL | Status: DC
  Administered 2016-08-01 – 2016-08-03 (×4): 100 mg via ORAL
  Filled 2016-08-01 (×4): qty 2

## 2016-08-01 MED ORDER — PHENOL 1.4 % MT LIQD: 1.0000 | OROMUCOSAL | Status: DC | PRN

## 2016-08-01 MED ORDER — TRAMADOL HCL 50 MG PO TABS: 50.0000 mg | ORAL_TABLET | Freq: Four times a day (QID) | ORAL | Status: DC | PRN

## 2016-08-01 MED ORDER — BUPIVACAINE IN DEXTROSE 0.75-8.25 % IT SOLN
INTRATHECAL | Status: DC | PRN
  Administered 2016-08-01: 13.5 mg via INTRATHECAL

## 2016-08-01 MED ORDER — METHOCARBAMOL 1000 MG/10ML IJ SOLN
500.0000 mg | Freq: Four times a day (QID) | INTRAVENOUS | Status: DC | PRN
  Administered 2016-08-01: 500 mg via INTRAVENOUS
  Filled 2016-08-01: qty 5
  Filled 2016-08-01: qty 550

## 2016-08-01 MED ORDER — ACETAMINOPHEN 10 MG/ML IV SOLN
INTRAVENOUS | Status: AC
Start: 2016-08-01 — End: 2016-08-01
  Filled 2016-08-01: qty 100

## 2016-08-01 MED ORDER — PROPOFOL 10 MG/ML IV BOLUS
INTRAVENOUS | Status: AC
Start: 2016-08-01 — End: 2016-08-01
  Filled 2016-08-01: qty 40

## 2016-08-01 MED ORDER — DEXAMETHASONE SODIUM PHOSPHATE 10 MG/ML IJ SOLN
INTRAMUSCULAR | Status: AC
  Filled 2016-08-01: qty 1

## 2016-08-01 MED ORDER — METOCLOPRAMIDE HCL 5 MG/ML IJ SOLN: 5.0000 mg | Freq: Three times a day (TID) | INTRAMUSCULAR | Status: DC | PRN

## 2016-08-01 MED ORDER — OXYCODONE HCL 5 MG PO TABS
5.0000 mg | ORAL_TABLET | ORAL | Status: DC | PRN
  Administered 2016-08-01: 5 mg via ORAL
  Administered 2016-08-01 – 2016-08-03 (×8): 10 mg via ORAL
  Filled 2016-08-01 (×8): qty 2
  Filled 2016-08-01: qty 1

## 2016-08-01 MED ORDER — MENTHOL 3 MG MT LOZG: 1.0000 | LOZENGE | OROMUCOSAL | Status: DC | PRN

## 2016-08-01 MED ORDER — VANCOMYCIN HCL IN DEXTROSE 1-5 GM/200ML-% IV SOLN
1000.0000 mg | Freq: Two times a day (BID) | INTRAVENOUS | Status: AC
  Administered 2016-08-01: 1000 mg via INTRAVENOUS
  Filled 2016-08-01: qty 200

## 2016-08-01 MED ORDER — ONDANSETRON HCL 4 MG/2ML IJ SOLN
INTRAMUSCULAR | Status: DC | PRN
  Administered 2016-08-01: 4 mg via INTRAVENOUS

## 2016-08-01 MED ORDER — LIDOCAINE 2% (20 MG/ML) 5 ML SYRINGE
INTRAMUSCULAR | Status: AC
  Filled 2016-08-01: qty 5

## 2016-08-01 MED ORDER — ACETAMINOPHEN 500 MG PO TABS
1000.0000 mg | ORAL_TABLET | Freq: Four times a day (QID) | ORAL | Status: AC
  Administered 2016-08-01 – 2016-08-02 (×4): 1000 mg via ORAL
  Filled 2016-08-01 (×3): qty 2

## 2016-08-01 MED ORDER — DIPHENHYDRAMINE HCL 12.5 MG/5ML PO ELIX: 12.5000 mg | ORAL_SOLUTION | ORAL | Status: DC | PRN

## 2016-08-01 SURGICAL SUPPLY — 50 items
BAG DECANTER FOR FLEXI CONT (MISCELLANEOUS) IMPLANT
BAG ZIPLOCK 12X15 (MISCELLANEOUS) ×2 IMPLANT
BANDAGE ACE 6X5 VEL STRL LF (GAUZE/BANDAGES/DRESSINGS) ×2 IMPLANT
BLADE SAG 18X100X1.27 (BLADE) ×2 IMPLANT
BLADE SAW SGTL 11.0X1.19X90.0M (BLADE) ×2 IMPLANT
BOWL SMART MIX CTS (DISPOSABLE) ×2 IMPLANT
CAPT KNEE TOTAL 3 ATTUNE ×2 IMPLANT
CEMENT HV SMART SET (Cement) ×4 IMPLANT
CLOTH BEACON ORANGE TIMEOUT ST (SAFETY) ×2 IMPLANT
CUFF TOURN SGL QUICK 34 (TOURNIQUET CUFF) ×1
CUFF TRNQT CYL 34X4X40X1 (TOURNIQUET CUFF) ×1 IMPLANT
DECANTER SPIKE VIAL GLASS SM (MISCELLANEOUS) ×2 IMPLANT
DRAPE U-SHAPE 47X51 STRL (DRAPES) ×2 IMPLANT
DRSG ADAPTIC 3X8 NADH LF (GAUZE/BANDAGES/DRESSINGS) ×2 IMPLANT
DRSG PAD ABDOMINAL 8X10 ST (GAUZE/BANDAGES/DRESSINGS) ×2 IMPLANT
DURAPREP 26ML APPLICATOR (WOUND CARE) ×2 IMPLANT
ELECT REM PT RETURN 9FT ADLT (ELECTROSURGICAL) ×2
ELECTRODE REM PT RTRN 9FT ADLT (ELECTROSURGICAL) ×1 IMPLANT
EVACUATOR 1/8 PVC DRAIN (DRAIN) ×2 IMPLANT
GAUZE SPONGE 4X4 12PLY STRL (GAUZE/BANDAGES/DRESSINGS) ×2 IMPLANT
GLOVE BIO SURGEON STRL SZ7.5 (GLOVE) ×2 IMPLANT
GLOVE BIO SURGEON STRL SZ8 (GLOVE) ×2 IMPLANT
GLOVE BIOGEL PI IND STRL 6.5 (GLOVE) IMPLANT
GLOVE BIOGEL PI IND STRL 8 (GLOVE) ×2 IMPLANT
GLOVE BIOGEL PI INDICATOR 6.5 (GLOVE)
GLOVE BIOGEL PI INDICATOR 8 (GLOVE) ×2
GLOVE SURG SS PI 6.5 STRL IVOR (GLOVE) IMPLANT
GOWN STRL REUS W/TWL LRG LVL3 (GOWN DISPOSABLE) ×2 IMPLANT
GOWN STRL REUS W/TWL XL LVL3 (GOWN DISPOSABLE) ×2 IMPLANT
HANDPIECE INTERPULSE COAX TIP (DISPOSABLE) ×1
IMMOBILIZER KNEE 20 (SOFTGOODS) ×2
IMMOBILIZER KNEE 20 THIGH 36 (SOFTGOODS) ×1 IMPLANT
MANIFOLD NEPTUNE II (INSTRUMENTS) ×2 IMPLANT
NS IRRIG 1000ML POUR BTL (IV SOLUTION) ×2 IMPLANT
PACK TOTAL KNEE CUSTOM (KITS) ×2 IMPLANT
PADDING CAST COTTON 6X4 STRL (CAST SUPPLIES) ×4 IMPLANT
POSITIONER SURGICAL ARM (MISCELLANEOUS) ×2 IMPLANT
SET HNDPC FAN SPRY TIP SCT (DISPOSABLE) ×1 IMPLANT
STRIP CLOSURE SKIN 1/2X4 (GAUZE/BANDAGES/DRESSINGS) ×4 IMPLANT
SUT MNCRL AB 4-0 PS2 18 (SUTURE) ×2 IMPLANT
SUT VIC AB 2-0 CT1 27 (SUTURE) ×3
SUT VIC AB 2-0 CT1 TAPERPNT 27 (SUTURE) ×3 IMPLANT
SUT VLOC 180 0 24IN GS25 (SUTURE) ×2 IMPLANT
SYR 50ML LL SCALE MARK (SYRINGE) IMPLANT
TRAY FOLEY CATH 14FRSI W/METER (CATHETERS) ×2 IMPLANT
TRAY FOLEY W/METER SILVER 16FR (SET/KITS/TRAYS/PACK) IMPLANT
TUBING IRRIGATION (MISCELLANEOUS) ×2 IMPLANT
WATER STERILE IRR 1500ML POUR (IV SOLUTION) ×2 IMPLANT
WRAP KNEE MAXI GEL POST OP (GAUZE/BANDAGES/DRESSINGS) ×2 IMPLANT
YANKAUER SUCT BULB TIP 10FT TU (MISCELLANEOUS) ×2 IMPLANT

## 2016-08-01 NOTE — Anesthesia Procedure Notes (Signed)
Procedure Name: MAC Date/Time: 08/01/2016 10:25 AM Performed by: Jarvis NewcomerARMISTEAD, Roselee Tayloe A Pre-anesthesia Checklist: Patient identified, Emergency Drugs available, Timeout performed, Suction available and Patient being monitored Patient Re-evaluated:Patient Re-evaluated prior to inductionOxygen Delivery Method: Simple face mask Dental Injury: Teeth and Oropharynx as per pre-operative assessment

## 2016-08-01 NOTE — Progress Notes (Signed)
PT Cancellation Note  Patient Details Name: Mariel SleetGwendolyn J Balke MRN: [id number] DOB: 09/12/1950   Cancelled Treatment:    Reason Eval/Treat Not Completed: Patient not medically ready (stilll with numbness)   Rada HayHill, Clarance Bollard Elizabeth 08/01/2016, 5:16 PM

## 2016-08-01 NOTE — Transfer of Care (Signed)
Immediate Anesthesia Transfer of Care Note  Patient: [name]  Procedure(s) Performed: Procedure(s): TOTAL KNEE ARTHROPLASTY (Right)  Patient Location: PACU  Anesthesia Type:MAC and Spinal  Level of Consciousness: awake, alert , oriented and patient cooperative  Airway & Oxygen Therapy: Patient Spontanous Breathing and Patient connected to face mask oxygen  Post-op Assessment: Report given to RN and Post -op Vital signs reviewed and stable  Post vital signs: Reviewed and stable  Last Vitals:  Vitals:   08/01/16 0712  BP: (!) 159/64  Pulse: (!) 54  Resp: 16  Temp: 36.4 C    Last Pain:  Vitals:   08/01/16 0949  TempSrc:   PainSc: 0-No pain      Patients Stated Pain Goal: 4 (08/01/16 0949)  Complications: No apparent anesthesia complications

## 2016-08-01 NOTE — Anesthesia Procedure Notes (Addendum)
Spinal  Patient location during procedure: OR Start time: 08/01/2016 10:35 AM End time: 08/01/2016 10:40 AM Staffing Anesthesiologist: Gaynelle AduFITZGERALD, Dennies Coate Performed: anesthesiologist  Preanesthetic Checklist Completed: patient identified, surgical consent, pre-op evaluation, timeout performed, IV checked, risks and benefits discussed and monitors and equipment checked Spinal Block Patient position: sitting Prep: DuraPrep Patient monitoring: cardiac monitor, continuous pulse ox and blood pressure Approach: midline Location: L3-4 Injection technique: single-shot Needle Needle type: Spinocan  Needle gauge: 24 G Needle length: 9 cm Assessment Sensory level: T8 Additional Notes Functioning IV was confirmed and monitors were applied. Sterile prep and drape, including hand hygiene and sterile gloves were used. The patient was positioned and the spine was prepped. The skin was anesthetized with lidocaine.  Free flow of clear CSF was obtained prior to injecting local anesthetic into the CSF.  The spinal needle aspirated freely following injection.  The needle was carefully withdrawn.  The patient tolerated the procedure well.

## 2016-08-01 NOTE — Addendum Note (Signed)
Addendum  created 08/01/16 1512 by Elisabeth CaraLacey A Della Homan, CRNA   Charge Capture section accepted

## 2016-08-01 NOTE — Anesthesia Postprocedure Evaluation (Signed)
Anesthesia Post Note  Patient: [name]  Procedure(s) Performed: Procedure(s) (LRB): TOTAL KNEE ARTHROPLASTY (Right)  Patient location during evaluation: PACU Anesthesia Type: Spinal and MAC Level of consciousness: awake and alert Pain management: pain level controlled Vital Signs Assessment: post-procedure vital signs reviewed and stable Respiratory status: spontaneous breathing, respiratory function stable and patient connected to nasal cannula oxygen Cardiovascular status: blood pressure returned to baseline and stable Postop Assessment: spinal receding Anesthetic complications: no    Last Vitals:  Vitals:   08/01/16 1300 08/01/16 1312  BP: (!) 122/50 (!) 122/50  Pulse: (!) 47 (!) 50  Resp: 15 15  Temp: 36.7 C 36.7 C    Last Pain:  Vitals:   08/01/16 1215  TempSrc:   PainSc: 0-No pain                 Phill Steck,W. EDMOND

## 2016-08-01 NOTE — Op Note (Signed)
OPERATIVE REPORT-TOTAL KNEE ARTHROPLASTY   Pre-operative diagnosis- Osteoarthritis  Right knee(s)  Post-operative diagnosis- Osteoarthritis Right knee(s)  Procedure-  Right  Total Knee Arthroplasty  Surgeon- Gus RankinFrank V. Bernie Fobes, MD  Assistant- Avel Peacerew Perkins, PA-C   Anesthesia-  Spinal  EBL-* No blood loss amount entered *   Drains Hemovac  Tourniquet time-  Total Tourniquet Time Documented: Thigh (Right) - 32 minutes Total: Thigh (Right) - 32 minutes     Complications- None  Condition-PACU - hemodynamically stable.   Brief Clinical Note  Christine SleetGwendolyn [name] is a 65 y.o. year old female with end stage OA of her right knee with progressively worsening pain and dysfunction. She has constant pain, with activity and at rest and significant functional deficits with difficulties even with ADLs. She has had extensive non-op management including analgesics, injections of cortisone and viscosupplements, and home exercise program, but remains in significant pain with significant dysfunction.Radiographs show bone on bone arthritis medial and patellofemoral. She presents now for right Total Knee Arthroplasty.    Procedure in detail---   The patient is brought into the operating room and positioned supine on the operating table. After successful administration of  Spinal,   a tourniquet is placed high on the  Right thigh(s) and the lower extremity is prepped and draped in the usual sterile fashion. Time out is performed by the operating team and then the  Right lower extremity is wrapped in Esmarch, knee flexed and the tourniquet inflated to 300 mmHg.       A midline incision is made with a ten blade through the subcutaneous tissue to the level of the extensor mechanism. A fresh blade is used to make a medial parapatellar arthrotomy. Soft tissue over the proximal medial tibia is subperiosteally elevated to the joint line with a knife and into the semimembranosus bursa with a Cobb elevator. Soft  tissue over the proximal lateral tibia is elevated with attention being paid to avoiding the patellar tendon on the tibial tubercle. The patella is everted, knee flexed 90 degrees and the ACL and PCL are removed. Findings are bone on bone medial and patellofemoral with large global osteophytes.        The drill is used to create a starting hole in the distal femur and the canal is thoroughly irrigated with sterile saline to remove the fatty contents. The 5 degree Right  valgus alignment guide is placed into the femoral canal and the distal femoral cutting block is pinned to remove 10 mm off the distal femur. Resection is made with an oscillating saw.      The tibia is subluxed forward and the menisci are removed. The extramedullary alignment guide is placed referencing proximally at the medial aspect of the tibial tubercle and distally along the second metatarsal axis and tibial crest. The block is pinned to remove 2mm off the more deficient medial  side. Resection is made with an oscillating saw. Size 5is the most appropriate size for the tibia and the proximal tibia is prepared with the modular drill and keel punch for that size.      The femoral sizing guide is placed and size 5 is most appropriate. Rotation is marked off the epicondylar axis and confirmed by creating a rectangular flexion gap at 90 degrees. The size 5 cutting block is pinned in this rotation and the anterior, posterior and chamfer cuts are made with the oscillating saw. The intercondylar block is then placed and that cut is made.  Trial size 5 tibial component, trial size 5 posterior stabilized femur and a 10  mm posterior stabilized rotating platform insert trial is placed. Full extension is achieved with excellent varus/valgus and anterior/posterior balance throughout full range of motion. The patella is everted and thickness measured to be 22  mm. Free hand resection is taken to 12 mm, a 35 template is placed, lug holes are drilled,  trial patella is placed, and it tracks normally. Osteophytes are removed off the posterior femur with the trial in place. All trials are removed and the cut bone surfaces prepared with pulsatile lavage. Cement is mixed and once ready for implantation, the size 5 tibial implant, size  5 posterior stabilized femoral component, and the size 35 patella are cemented in place and the patella is held with the clamp. The trial insert is placed and the knee held in full extension. The Exparel (20 ml mixed with 30 ml saline) and .25% Bupivicaine, are injected into the extensor mechanism, posterior capsule, medial and lateral gutters and subcutaneous tissues.  All extruded cement is removed and once the cement is hard the permanent 10 mm posterior stabilized rotating platform insert is placed into the tibial tray.      The wound is copiously irrigated with saline solution and the extensor mechanism closed over a hemovac drain with #1 V-loc suture. The tourniquet is released for a total tourniquet time of 32  minutes. Flexion against gravity is 135 degrees and the patella tracks normally. Subcutaneous tissue is closed with 2.0 vicryl and subcuticular with running 4.0 Monocryl. The incision is cleaned and dried and steri-strips and a bulky sterile dressing are applied. The limb is placed into a knee immobilizer and the patient is awakened and transported to recovery in stable condition.      Please note that a surgical assistant was a medical necessity for this procedure in order to perform it in a safe and expeditious manner. Surgical assistant was necessary to retract the ligaments and vital neurovascular structures to prevent injury to them and also necessary for proper positioning of the limb to allow for anatomic placement of the prosthesis.   Gus RankinFrank V. Riot Waterworth, MD    08/01/2016, 11:39 AM

## 2016-08-01 NOTE — H&P (View-Only) (Signed)
[name] DOB: 03/16/1951 Married / Language: English / Race: White Female Date of Admission:  08/01/2016 CC:  Right Knee Pain History of Present Illness The patient is a 65 year old female who comes in for a preoperative History and Physical. The patient is scheduled for a right total knee arthroplasty to be performed by Dr. Frank V. Aluisio, MD at [facility] on 08-01-2016. The patient is a 65 year old female who is now several months out from the left total knee arthroplasty. The patient states that she is doing well at this time. The pain is under excellent control at this time and describe their pain as mild. They are currently on Tylenol (Arthritis strength) for their pain. The patient is currently doing home exercise program. The patient feels that they are progressing well at this time. The right knee continues to be problematic. She has done well with the left knee so it is felt that she would be a good candidate to proceed with the right side at this time. They have been treated conservatively in the past for the above stated problem and despite conservative measures, they continue to have progressive pain and severe functional limitations and dysfunction. They have failed non-operative management including home exercise, medications, and injections. It is felt that they would benefit from undergoing total joint replacement. Risks and benefits of the procedure have been discussed with the patient and they elect to proceed with surgery. There are no active contraindications to surgery such as ongoing infection or rapidly progressive neurological disease.   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 Osteoarthritis  Mother. Rheumatoid Arthritis  Mother.  Social History  Children  2 Current work status  retired Exercise  Exercises never Former drinker  04/22/2015: In the past drank wine only occasionally per week Living situation  live with spouse Marital status  married No history of drug/alcohol rehab  Not under pain contract  Number of flights of stairs before winded  1 Tobacco / smoke exposure  04/22/2015: no Tobacco use  Former smoker. 04/22/2015: smoke(d) 1 pack(s) per day Advance Directives  Living Will, Healthcare POA  Medication History Tylenol (Oral) Specific strength unknown - Active. Vitamin D (Oral) Specific strength unknown - Active. Metoprolol Tartrate (100MG Tablet, Oral two times daily) Active. Levothyroxine Sodium (75MCG Tablet, Oral) Active. Furosemide (20MG Tablet, Oral) Active.  Past Surgical History Dilation and Curettage of Uterus - Multiple  1994, 09/2009 Gallbladder Surgery  Date: 2005. laporoscopic Hernia Repair  Date: 09/2015. Umbilical Hernia Total Knee Replacement - Left  Date: 03/2016.    Review of Systems General Not Present- Chills, Fatigue, Fever, Memory Loss, Night Sweats, Weight Gain and Weight Loss. Skin Not Present- Eczema, Hives, Itching, Lesions and Rash. HEENT Not Present- Dentures, Double Vision, Headache, Hearing Loss, Tinnitus and Visual Loss. Respiratory Not Present- Allergies, Chronic Cough, Coughing up blood, Shortness of breath at rest and Shortness of breath with exertion. Cardiovascular Not Present- Chest Pain, Difficulty Breathing Lying Down, Murmur, Palpitations, Racing/skipping heartbeats and Swelling. Gastrointestinal Not Present- Abdominal Pain, Bloody Stool, Constipation, Diarrhea, Difficulty Swallowing, Heartburn, Jaundice, Loss of appetitie, Nausea and Vomiting. Female Genitourinary Not Present- Blood in Urine, Discharge, Flank  Pain, Incontinence, Painful   Urination, Urgency, Urinary frequency, Urinary Retention, Urinating at Night and Weak urinary stream. Musculoskeletal Present- Back Pain and Joint Pain. Not Present- Joint Swelling, Morning Stiffness, Muscle Pain, Muscle Weakness and Spasms. Neurological Not Present- Blackout spells, Difficulty with balance, Dizziness, Paralysis, Tremor and Weakness. Psychiatric Not Present- Insomnia.  Vitals  Weight: 220 lb Height: 66in Body Surface Area: 2.08 m Body Mass Index: 35.51 kg/m  Pulse: 52 (Regular)  BP: 128/78 (Sitting, Right Arm, Standard)   Physical Exam General Mental Status -Alert, cooperative and good historian. General Appearance-pleasant, Not in acute distress. Orientation-Oriented X3. Build & Nutrition-Well nourished and Well developed.  Head and Neck Head-normocephalic, atraumatic . Neck Global Assessment - supple, no bruit auscultated on the right, no bruit auscultated on the left.  Eye Pupil - Bilateral-Regular and Round. Motion - Bilateral-EOMI.  Chest and Lung Exam Auscultation Breath sounds - clear at anterior chest wall and clear at posterior chest wall. Adventitious sounds - No Adventitious sounds.  Cardiovascular Auscultation Rhythm - Regular rate and rhythm. Heart Sounds - S1 WNL and S2 WNL. Murmurs & Other Heart Sounds: Murmur 1 - Location - Aortic Area. Timing - Mid-systolic. Grade - II/VI.  Abdomen Palpation/Percussion Tenderness - Abdomen is non-tender to palpation. Rigidity (guarding) - Abdomen is soft. Auscultation Auscultation of the abdomen reveals - Bowel sounds normal.  Female Genitourinary Note: Not done, not pertinent to present illness   Musculoskeletal Note: Well developed female, in no distress. Left knee looks great. There is minimal swelling. Range 0 to 122 degrees with no instability.   Assessment & Plan  Primary osteoarthritis of right knee (M17.11)  Status post total left  knee replacement (W09.811(Z96.652)  Note:Surgical Plans: Right Total Knee Replacement  Disposition: Home  PCP: Dr. Ronne BinningMcKenzie  IV TXA  Anesthesia Issues: None  Signed electronically by Beckey RutterAlezandrew L Humza Tallerico, III PA-C

## 2016-08-01 NOTE — Interval H&P Note (Signed)
History and Physical Interval Note:  08/01/2016 9:32 AM  [name]  has presented today for surgery, with the diagnosis of Right knee OA  The various methods of treatment have been discussed with the patient and family. After consideration of risks, benefits and other options for treatment, the patient has consented to  Procedure(s): TOTAL KNEE ARTHROPLASTY (Right) as a surgical intervention .  The patient's history has been reviewed, patient examined, no change in status, stable for surgery.  I have reviewed the patient's chart and labs.  Questions were answered to the patient's satisfaction.     Loanne DrillingALUISIO,Alea Ryer V

## 2016-08-02 LAB — CBC
HEMATOCRIT: 36.1 % (ref 36.0–46.0)
HEMOGLOBIN: 11.9 g/dL — AB (ref 12.0–15.0)
MCH: 29.8 pg (ref 26.0–34.0)
MCHC: 33 g/dL (ref 30.0–36.0)
MCV: 90.3 fL (ref 78.0–100.0)
Platelets: 182 10*3/uL (ref 150–400)
RBC: 4 MIL/uL (ref 3.87–5.11)
RDW: 14.5 % (ref 11.5–15.5)
WBC: 11.8 10*3/uL — AB (ref 4.0–10.5)

## 2016-08-02 LAB — BASIC METABOLIC PANEL
ANION GAP: 5 (ref 5–15)
BUN: 20 mg/dL (ref 6–20)
CALCIUM: 8.6 mg/dL — AB (ref 8.9–10.3)
CHLORIDE: 105 mmol/L (ref 101–111)
CO2: 27 mmol/L (ref 22–32)
Creatinine, Ser: 0.81 mg/dL (ref 0.44–1.00)
GFR calc non Af Amer: >60 mL/min (ref >60)
Glucose, Bld: 128 mg/dL — ABNORMAL HIGH (ref 65–99)
Potassium: 4.4 mmol/L (ref 3.5–5.1)
Sodium: 137 mmol/L (ref 135–145)

## 2016-08-02 MED ORDER — RIVAROXABAN 10 MG PO TABS: 10.0000 mg | ORAL_TABLET | Freq: Every day | ORAL | 0 refills | Status: DC

## 2016-08-02 MED ORDER — LIP MEDEX EX OINT
TOPICAL_OINTMENT | CUTANEOUS | Status: AC
  Administered 2016-08-02: 16:00:00
  Filled 2016-08-02: qty 7

## 2016-08-02 MED ORDER — METHOCARBAMOL 500 MG PO TABS: 500.0000 mg | ORAL_TABLET | Freq: Four times a day (QID) | ORAL | 0 refills | Status: DC | PRN

## 2016-08-02 MED ORDER — TRAMADOL HCL 50 MG PO TABS: 50.0000 mg | ORAL_TABLET | Freq: Four times a day (QID) | ORAL | 1 refills | Status: DC | PRN

## 2016-08-02 MED ORDER — OXYCODONE HCL 5 MG PO TABS: 5.0000 mg | ORAL_TABLET | ORAL | 0 refills | Status: DC | PRN

## 2016-08-02 NOTE — Progress Notes (Signed)
   Subjective: 1 Day Post-Op Procedure(s) (LRB): TOTAL KNEE ARTHROPLASTY (Right) Patient reports pain as mild.   Patient seen in rounds for Dr. Lequita HaltAluisio. Patient is well, but has had some minor complaints of pain in the knee, requiring pain medications We will start therapy today.  Plan is to go Home after hospital stay.  Objective: Vital signs in last 24 hours: Temp:  [98.4 F (36.9 C)-99.7 F (37.6 C)] 99.7 F (37.6 C) (12/12 2120) Pulse Rate:  [58-70] 62 (12/12 2120) Resp:  [16] 16 (12/12 2120) BP: (102-151)/(40-57) 147/54 (12/12 2120) SpO2:  [93 %-99 %] 95 % (12/12 2120)  Intake/Output from previous day:  Intake/Output Summary (Last 24 hours) at 08/02/16 2254 Last data filed at 08/02/16 2120  Gross per 24 hour  Intake           2267.5 ml  Output              935 ml  Net           1332.5 ml    Intake/Output this shift: Total I/O In: 480 [P.O.:480] Out: 400 [Urine:400]  Labs:  Recent Labs  08/02/16 0502  HGB 11.9*    Recent Labs  08/02/16 0502  WBC 11.8*  RBC 4.00  HCT 36.1  PLT 182    Recent Labs  08/02/16 0502  NA 137  K 4.4  CL 105  CO2 27  BUN 20  CREATININE 0.81  GLUCOSE 128*  CALCIUM 8.6*   No results for input(s): LABPT, INR in the last 72 hours.  EXAM General - Patient is Alert, Appropriate and Oriented Extremity - Neurovascular intact Sensation intact distally Intact pulses distally Dorsiflexion/Plantar flexion intact Dressing - dressing C/D/I Motor Function - intact, moving foot and toes well on exam.  Hemovac pulled without difficulty.  Past Medical History:  Diagnosis Date  . Arthritis   . CHF (congestive heart failure) (HCC)   . Essential hypertension 10/15/2015  . Heart murmur   . Hypertension   . Hypothyroidism 10/15/2015  . 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"    Assessment/Plan: 1 Day Post-Op Procedure(s) (LRB): TOTAL KNEE ARTHROPLASTY (Right) Principal Problem:   OA (osteoarthritis)  of knee  Estimated body mass index is 36.8 kg/m as calculated from the following:   Height as of this encounter: 5\' 6"  (1.676 m).   Weight as of this encounter: 103.4 kg (228 lb). Advance diet Up with therapy Plan for discharge tomorrow Discharge home - plan home with Virtual Therapy  DVT Prophylaxis - Xarelto Weight-Bearing as tolerated to right leg D/C O2 and Pulse OX and try on Room Air  Avel Peacerew Dorena Dorfman, PA-C Orthopaedic Surgery 08/02/2016, 10:54 PM

## 2016-08-02 NOTE — Discharge Summary (Signed)
Physician Discharge Summary   Patient ID: [name] MRN: [id number] DOB/AGE: [birth year] 67 y.o.  Admit date: 08/01/2016 Discharge date: 08/03/2016  Primary Diagnosis:  Osteoarthritis  Right knee(s)  Admission Diagnoses:  Past Medical History:  Diagnosis Date  . Arthritis   . CHF (congestive heart failure) ([place])   . Essential hypertension 10/15/2015  . Heart murmur   . Hypertension   . Hypothyroidism 10/15/2015  . 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"   Discharge Diagnoses:   Principal Problem:   OA (osteoarthritis) of knee  Estimated body mass index is 36.8 kg/m as calculated from the following:   Height as of this encounter: _0  (1.676 m).   Weight as of this encounter: 103.4 kg (228 lb).  Procedure:  Procedure(s) (LRB): TOTAL KNEE ARTHROPLASTY (Right)   Consults: None  HPI: [name] is a 65 y.o. year old female with end stage OA of her right knee with progressively worsening pain and dysfunction. She has constant pain, with activity and at rest and significant functional deficits with difficulties even with ADLs. She has had extensive non-op management including analgesics, injections of cortisone and viscosupplements, and home exercise program, but remains in significant pain with significant dysfunction.Radiographs show bone on bone arthritis medial and patellofemoral. She presents now for right Total Knee Arthroplasty.    Laboratory Data: Admission on 08/01/2016  Component Date Value Ref Range Status  . ABO/RH(D) 08/01/2016 B NEG   Final  . Antibody Screen 08/01/2016 POS   Final  . Sample Expiration 08/01/2016 08/04/2016   Final  . Antibody Identification 75/64/3329 ANTI C ANTI D   Final  . DAT, IgG 08/01/2016 NEG   Final  . Unit Number 08/01/2016 [id number]   Final  . Blood Component Type 08/01/2016 RED CELLS,LR   Final  . Unit division 08/01/2016 00   Final  . Status of Unit 08/01/2016 ALLOCATED   Final  . Donor  AG Type 08/01/2016 NEGATIVE FOR C ANTIGEN   Final  . Transfusion Status 08/01/2016 OK TO TRANSFUSE   Final  . Crossmatch Result 08/01/2016 COMPATIBLE   Final  . Unit Number 08/01/2016 [id number]   Final  . Blood Component Type 08/01/2016 RED CELLS,LR   Final  . Unit division 08/01/2016 00   Final  . Status of Unit 08/01/2016 ALLOCATED   Final  . Donor AG Type 08/01/2016 NEGATIVE FOR C ANTIGEN   Final  . Transfusion Status 08/01/2016 OK TO TRANSFUSE   Final  . Crossmatch Result 08/01/2016 COMPATIBLE   Final  . WBC 08/02/2016 11.8* 4.0 - 10.5 K/uL Final  . RBC 08/02/2016 4.00  3.87 - 5.11 MIL/uL Final  . Hemoglobin 08/02/2016 11.9* 12.0 - 15.0 g/dL Final  . HCT 08/02/2016 36.1  36.0 - 46.0 % Final  . MCV 08/02/2016 90.3  78.0 - 100.0 fL Final  . MCH 08/02/2016 29.8  26.0 - 34.0 pg Final  . MCHC 08/02/2016 33.0  30.0 - 36.0 g/dL Final  . RDW 08/02/2016 14.5  11.5 - 15.5 % Final  . Platelets 08/02/2016 182  150 - 400 K/uL Final  . Sodium 08/02/2016 137  135 - 145 mmol/L Final  . Potassium 08/02/2016 4.4  3.5 - 5.1 mmol/L Final  . Chloride 08/02/2016 105  101 - 111 mmol/L Final  . CO2 08/02/2016 27  22 - 32 mmol/L Final  . Glucose, Bld 08/02/2016 128* 65 - 99 mg/dL Final  . BUN 08/02/2016 20  6 - 20  mg/dL Final  . Creatinine, Ser 08/02/2016 0.81  0.44 - 1.00 mg/dL Final  . Calcium 08/02/2016 8.6* 8.9 - 10.3 mg/dL Final  . GFR calc non Af Amer 08/02/2016 >60  >60 mL/min Final  . GFR calc Af Amer 08/02/2016 >60  >60 mL/min Final   Comment: (NOTE) The eGFR has been calculated using the CKD EPI equation. This calculation has not been validated in all clinical situations. eGFR's persistently <60 mL/min signify possible Chronic Kidney Disease.   . Anion gap 08/02/2016 5  5 - 15 Final  . WBC 08/03/2016 11.1* 4.0 - 10.5 K/uL Final  . RBC 08/03/2016 3.81* 3.87 - 5.11 MIL/uL Final  . Hemoglobin 08/03/2016 11.5* 12.0 - 15.0 g/dL Final  . HCT 08/03/2016 34.5* 36.0 - 46.0 % Final  . MCV  08/03/2016 90.6  78.0 - 100.0 fL Final  . MCH 08/03/2016 30.2  26.0 - 34.0 pg Final  . MCHC 08/03/2016 33.3  30.0 - 36.0 g/dL Final  . RDW 08/03/2016 14.8  11.5 - 15.5 % Final  . Platelets 08/03/2016 151  150 - 400 K/uL Final  . Sodium 08/03/2016 140  135 - 145 mmol/L Final  . Potassium 08/03/2016 4.3  3.5 - 5.1 mmol/L Final  . Chloride 08/03/2016 108  101 - 111 mmol/L Final  . CO2 08/03/2016 27  22 - 32 mmol/L Final  . Glucose, Bld 08/03/2016 112* 65 - 99 mg/dL Final  . BUN 08/03/2016 25* 6 - 20 mg/dL Final  . Creatinine, Ser 08/03/2016 0.82  0.44 - 1.00 mg/dL Final  . Calcium 08/03/2016 8.7* 8.9 - 10.3 mg/dL Final  . GFR calc non Af Amer 08/03/2016 >60  >60 mL/min Final  . GFR calc Af Amer 08/03/2016 >60  >60 mL/min Final   Comment: (NOTE) The eGFR has been calculated using the CKD EPI equation. This calculation has not been validated in all clinical situations. eGFR's persistently <60 mL/min signify possible Chronic Kidney Disease.   Georgiann Hahn gap 08/03/2016 5  5 - 15 Final  Hospital Outpatient Visit on 07/27/2016  Component Date Value Ref Range Status  . MRSA, PCR 07/27/2016 NEGATIVE  NEGATIVE Final  . Staphylococcus aureus 07/27/2016 POSITIVE* NEGATIVE Final   Comment:        The Xpert SA Assay (FDA approved for NASAL specimens in patients over 50 years of age), is one component of a comprehensive surveillance program.  Test performance has been validated by [facility] for patients greater than or equal to 67 year old. It is not intended to diagnose infection nor to guide or monitor treatment.   Marland Kitchen aPTT 07/27/2016 30  24 - 36 seconds Final  . WBC 07/27/2016 6.7  4.0 - 10.5 K/uL Final  . RBC 07/27/2016 4.62  3.87 - 5.11 MIL/uL Final  . Hemoglobin 07/27/2016 14.0  12.0 - 15.0 g/dL Final  . HCT 07/27/2016 40.9  36.0 - 46.0 % Final  . MCV 07/27/2016 88.5  78.0 - 100.0 fL Final  . MCH 07/27/2016 30.3  26.0 - 34.0 pg Final  . MCHC 07/27/2016 34.2  30.0 - 36.0 g/dL Final  .  RDW 07/27/2016 14.3  11.5 - 15.5 % Final  . Platelets 07/27/2016 195  150 - 400 K/uL Final  . Sodium 07/27/2016 140  135 - 145 mmol/L Final  . Potassium 07/27/2016 4.2  3.5 - 5.1 mmol/L Final  . Chloride 07/27/2016 103  101 - 111 mmol/L Final  . CO2 07/27/2016 30  22 - 32 mmol/L Final  .  Glucose, Bld 07/27/2016 87  65 - 99 mg/dL Final  . BUN 07/27/2016 26* 6 - 20 mg/dL Final  . Creatinine, Ser 07/27/2016 0.84  0.44 - 1.00 mg/dL Final  . Calcium 07/27/2016 9.6  8.9 - 10.3 mg/dL Final  . Total Protein 07/27/2016 7.8  6.5 - 8.1 g/dL Final  . Albumin 07/27/2016 4.4  3.5 - 5.0 g/dL Final  . AST 07/27/2016 22  15 - 41 U/L Final  . ALT 07/27/2016 13* 14 - 54 U/L Final  . Alkaline Phosphatase 07/27/2016 91  38 - 126 U/L Final  . Total Bilirubin 07/27/2016 1.0  0.3 - 1.2 mg/dL Final  . GFR calc non Af Amer 07/27/2016 >60  >60 mL/min Final  . GFR calc Af Amer 07/27/2016 >60  >60 mL/min Final   Comment: (NOTE) The eGFR has been calculated using the CKD EPI equation. This calculation has not been validated in all clinical situations. eGFR's persistently <60 mL/min signify possible Chronic Kidney Disease.   . Anion gap 07/27/2016 7  5 - 15 Final  . Prothrombin Time 07/27/2016 13.7  11.4 - 15.2 seconds Final  . INR 07/27/2016 1.05   Final  . Color, Urine 07/27/2016 STRAW* YELLOW Final  . APPearance 07/27/2016 CLEAR  CLEAR Final  . Specific Gravity, Urine 07/27/2016 1.004* 1.005 - 1.030 Final  . pH 07/27/2016 7.0  5.0 - 8.0 Final  . Glucose, UA 07/27/2016 NEGATIVE  NEGATIVE mg/dL Final  . Hgb urine dipstick 07/27/2016 NEGATIVE  NEGATIVE Final  . Bilirubin Urine 07/27/2016 NEGATIVE  NEGATIVE Final  . Ketones, ur 07/27/2016 NEGATIVE  NEGATIVE mg/dL Final  . Protein, ur 07/27/2016 NEGATIVE  NEGATIVE mg/dL Final  . Nitrite 07/27/2016 NEGATIVE  NEGATIVE Final  . Leukocytes, UA 07/27/2016 NEGATIVE  NEGATIVE Final     X-Rays:No results found.  EKG: Orders placed or performed during the hospital  encounter of 10/12/15  . EKG 12-Lead  . EKG 12-Lead  . EKG     Hospital Course: [name] is a 64 y.o. who was admitted to [facility]. They were brought to the operating room on 08/01/2016 and underwent Procedure(s): TOTAL KNEE ARTHROPLASTY.  Patient tolerated the procedure well and was later transferred to the recovery room and then to the orthopaedic floor for postoperative care.  They were given PO and IV analgesics for pain control following their surgery.  They were given 24 hours of postoperative antibiotics of  Anti-infectives    Start     Dose/Rate Route Frequency Ordered Stop   08/01/16 2230  vancomycin (VANCOCIN) IVPB 1000 mg/200 mL premix     1,000 mg 200 mL/hr over 60 Minutes Intravenous Every 12 hours 08/01/16 1320 08/01/16 2327   08/01/16 0708  vancomycin (VANCOCIN) IVPB 1000 mg/200 mL premix     1,000 mg 200 mL/hr over 60 Minutes Intravenous On call to O.R. 08/01/16 0708 08/01/16 1053     and started on DVT prophylaxis in the form of Xarelto.   PT and OT were ordered for total joint protocol.  Discharge planning consulted to help with postop disposition and equipment needs.  Patient had a decent night on the evening of surgery.  They started to get up OOB with therapy on day one. Hemovac drain was pulled without difficulty.  Continued to work with therapy into day two.  Dressing was changed on day two and the incision was healing well.  By day three, the patient had progressed with therapy and meeting their goals.  Incision was  healing well.  Patient was seen in rounds and was ready to go home.   Diet: Cardiac diet Activity:WBAT Follow-up:in 2 weeks Disposition - Home Discharged Condition: good   Discharge Instructions    Call MD / Call 911    Complete by:  As directed    If you experience chest pain or shortness of breath, CALL 911 and be transported to the hospital emergency room.  If you develope a fever above 101 F, pus (white drainage) or increased  drainage or redness at the wound, or calf pain, call your surgeon's office.   Change dressing    Complete by:  As directed    Change dressing daily with sterile 4 x 4 inch gauze dressing and apply TED hose. Do not submerge the incision under water.   Constipation Prevention    Complete by:  As directed    Drink plenty of fluids.  Prune juice may be helpful.  You may use a stool softener, such as Colace (over the counter) 100 mg twice a day.  Use MiraLax (over the counter) for constipation as needed.   Diet - low sodium heart healthy    Complete by:  As directed    Discharge instructions    Complete by:  As directed    Pick up stool softner and laxative for home use following surgery while on pain medications. Do not submerge incision under water. Please use good hand washing techniques while changing dressing each day. May shower starting three days after surgery. Please use a clean towel to pat the incision dry following showers. Continue to use ice for pain and swelling after surgery. Do not use any lotions or creams on the incision until instructed by your surgeon.   Postoperative Constipation Protocol  Constipation - defined medically as fewer than three stools per week and severe constipation as less than one stool per week.  One of the most common issues patients have following surgery is constipation.  Even if you have a regular bowel pattern at home, your normal regimen is likely to be disrupted due to multiple reasons following surgery.  Combination of anesthesia, postoperative narcotics, change in appetite and fluid intake all can affect your bowels.  In order to avoid complications following surgery, here are some recommendations in order to help you during your recovery period.  Colace (docusate) - Pick up an over-the-counter form of Colace or another stool softener and take twice a day as long as you are requiring postoperative pain medications.  Take with a full glass of water  daily.  If you experience loose stools or diarrhea, hold the colace until you stool forms back up.  If your symptoms do not get better within 1 week or if they get worse, check with your doctor.  Dulcolax (bisacodyl) - Pick up over-the-counter and take as directed by the product packaging as needed to assist with the movement of your bowels.  Take with a full glass of water.  Use this product as needed if not relieved by Colace only.   MiraLax (polyethylene glycol) - Pick up over-the-counter to have on hand.  MiraLax is a solution that will increase the amount of water in your bowels to assist with bowel movements.  Take as directed and can mix with a glass of water, juice, soda, coffee, or tea.  Take if you go more than two days without a movement. Do not use MiraLax more than once per day. Call your doctor if you are still constipated  or irregular after using this medication for 7 days in a row.  If you continue to have problems with postoperative constipation, please contact the office for further assistance and recommendations.  If you experience "the worst abdominal pain ever" or develop nausea or vomiting, please contact the office immediatly for further recommendations for treatment.   Take Xarelto for two and a half more weeks, then discontinue Xarelto. Once the patient has completed the blood thinner regimen, then take a Baby 81 mg Aspirin daily for three more weeks.   Do not put a pillow under the knee. Place it under the heel.    Complete by:  As directed    Do not sit on low chairs, stoools or toilet seats, as it may be difficult to get up from low surfaces    Complete by:  As directed    Driving restrictions    Complete by:  As directed    No driving until released by the physician.   Increase activity slowly as tolerated    Complete by:  As directed    Lifting restrictions    Complete by:  As directed    No lifting until released by the physician.   Patient may shower     Complete by:  As directed    You may shower without a dressing once there is no drainage.  Do not wash over the wound.  If drainage remains, do not shower until drainage stops.   TED hose    Complete by:  As directed    Use stockings (TED hose) for 3 weeks on both leg(s).  You may remove them at night for sleeping.   Weight bearing as tolerated    Complete by:  As directed    Laterality:  right   Extremity:  Lower       Medication List    STOP taking these medications   Vitamin D 2000 units Caps     TAKE these medications   acetaminophen 650 MG CR tablet Commonly known as:  TYLENOL Take 1,300 mg by mouth at bedtime.   furosemide 20 MG tablet Commonly known as:  LASIX Take 40 mg by mouth every morning.   levothyroxine 75 MCG tablet Commonly known as:  SYNTHROID, LEVOTHROID Take 75 mcg by mouth daily before breakfast.   methocarbamol 500 MG tablet Commonly known as:  ROBAXIN Take 1 tablet (500 mg total) by mouth every 6 (six) hours as needed for muscle spasms.   metoprolol 100 MG tablet Commonly known as:  LOPRESSOR Take 100 mg by mouth 2 (two) times daily.   oxyCODONE 5 MG immediate release tablet Commonly known as:  Oxy IR/ROXICODONE Take 1-2 tablets (5-10 mg total) by mouth every 3 (three) hours as needed for moderate pain or severe pain.   rivaroxaban 10 MG Tabs tablet Commonly known as:  XARELTO Take 1 tablet (10 mg total) by mouth daily with breakfast. Take Xarelto for two and a half more weeks, then discontinue Xarelto. Once the patient has completed the blood thinner regimen, then take a Baby 81 mg Aspirin daily for three more weeks.   traMADol 50 MG tablet Commonly known as:  ULTRAM Take 1-2 tablets (50-100 mg total) by mouth every 6 (six) hours as needed (mild pain).      Follow-up Information    Gearlean Alf, MD. Schedule an appointment as soon as possible for a visit on 08/17/2016.   Specialty:  Orthopedic Surgery Why:  Will need to see the PA  on  Thursday 12/272017 in the afternoon. Contact information: [address] [place] [address] [phone]           Signed: Arlee Muslim, PA-C Orthopaedic Surgery 08/03/2016, 8:14 AM

## 2016-08-02 NOTE — Progress Notes (Signed)
OT Cancellation Note  Patient Details Name: Christine SleetGwendolyn [name] MRN: [id number] DOB: 09/21/1950   Cancelled Treatment:    Reason Eval/Treat Not Completed: PT screened, no needs identified, will sign off .  Pt recently had other knee replaced.   [name] 08/02/2016, 11:54 AM  [name], OTR/L [phone] 08/02/2016

## 2016-08-02 NOTE — Evaluation (Signed)
Physical Therapy Evaluation Patient Details Name: Christine SleetGwendolyn [name] MRN: [id number] DOB: 06/07/1951 Today's Date: 08/02/2016   History of Present Illness  R tka  Clinical Impression  The patient is progressing well. Plans DC home. Pt admitted with above diagnosis. Pt currently with functional limitations due to the deficits listed below (see PT Problem List).  Pt will benefit from skilled PT to increase their independence and safety with mobility to allow discharge to the venue listed below.        Follow Up Recommendations  (virtual)    Equipment Recommendations  None recommended by PT    Recommendations for Other Services       Precautions / Restrictions Precautions Precautions: Knee Required Braces or Orthoses: Knee Immobilizer - Right Knee Immobilizer - Right: Discontinue once straight leg raise with < 10 degree lag      Mobility  Bed Mobility Overal bed mobility: Needs Assistance Bed Mobility: Sit to Supine       Sit to supine: Min assist   General bed mobility comments: assist with the legs  Transfers Overall transfer level: Needs assistance Equipment used: Rolling walker (2 wheeled) Transfers: Sit to/from Stand Sit to Stand: Min assist         General transfer comment: cues for hand placement, right leg  Ambulation/Gait Ambulation/Gait assistance: Min assist Ambulation Distance (Feet): 200 Feet Assistive device: Rolling walker (2 wheeled) Gait Pattern/deviations: Step-to pattern;Step-through pattern     General Gait Details: cues for sequence  Stairs            Wheelchair Mobility    Modified Rankin (Stroke Patients Only)       Balance                                             Pertinent Vitals/Pain Pain Assessment: 0-10 Pain Score: 2  Pain Location: right knee Pain Descriptors / Indicators: Sore Pain Intervention(s): Monitored during session;Premedicated before session;Repositioned;Ice applied    Home  Living Family/patient expects to be discharged to:: Private residence Living Arrangements: Spouse/significant other Available Help at Discharge: Family Type of Home: House Home Access: Ramped entrance     Home Layout: One level Home Equipment: Environmental consultantWalker - 2 wheels;Toilet riser;Shower seat      Prior Function Level of Independence: Independent with assistive device(s)         Comments: used RW PTA     Hand Dominance        Extremity/Trunk Assessment   Upper Extremity Assessment: Overall WFL for tasks assessed           Lower Extremity Assessment: RLE deficits/detail RLE Deficits / Details: + SLR, knee flexion 50    Cervical / Trunk Assessment: Normal  Communication   Communication: No difficulties  Cognition Arousal/Alertness: Awake/alert Behavior During Therapy: WFL for tasks assessed/performed Overall Cognitive Status: Within Functional Limits for tasks assessed                      General Comments      Exercises Total Joint Exercises Ankle Circles/Pumps: AROM;Right;Left;10 reps Quad Sets: AROM;Right;Left;10 reps Towel Squeeze: AROM;Right;10 reps Heel Slides: AROM;Right;10 reps Hip ABduction/ADduction: AROM;Right;10 reps Straight Leg Raises: AROM;Right;10 reps   Assessment/Plan    PT Assessment Patient needs continued PT services  PT Problem List Decreased strength;Decreased range of motion;Decreased activity tolerance;Decreased balance;Decreased mobility;Decreased knowledge of precautions;Decreased safety awareness;Decreased knowledge  of use of DME;Pain          PT Treatment Interventions DME instruction;Gait training;Functional mobility training;Therapeutic activities;Therapeutic exercise;Patient/family education    PT Goals (Current goals can be found in the Care Plan section)  Acute Rehab PT Goals Patient Stated Goal: to walk PT Goal Formulation: With patient Time For Goal Achievement: 08/04/16 Potential to Achieve Goals: Good     Frequency 7X/week   Barriers to discharge        Co-evaluation               End of Session   Activity Tolerance: Patient tolerated treatment well Patient left: in bed;with call bell/phone within reach Nurse Communication: Mobility status         Time: [id number] PT Time Calculation (min) (ACUTE ONLY): 35 min   Charges:   PT Evaluation $PT Eval Low Complexity: 1 Procedure PT Treatments $Gait Training: 8-22 mins   PT G Codes:        Rada HayHill, Mariadejesus Cade Elizabeth 08/02/2016, 1:45 PM

## 2016-08-02 NOTE — Progress Notes (Signed)
Physical Therapy Treatment Patient Details Name: Christine SleetGwendolyn J Stelmach MRN: [id number] DOB: 04/18/1951 Today's Date: 08/02/2016    History of Present Illness R tka    PT Comments    Patient ambulating without KI. Progressing well/  Follow Up Recommendations   (virtual)     Equipment Recommendations  None recommended by PT    Recommendations for Other Services       Precautions / Restrictions Precautions Precautions: Knee Required Braces or Orthoses: Knee Immobilizer - Right Knee Immobilizer - Right: Discontinue once straight leg raise with < 10 degree lag    Mobility  Bed Mobility Overal bed mobility: Needs Assistance Bed Mobility: Supine to Sit;Sit to Supine     Supine to sit: Min guard Sit to supine: Min assist   General bed mobility comments: assist with the legs  Transfers Overall transfer level: Needs assistance Equipment used: Rolling walker (2 wheeled) Transfers: Sit to/from Stand Sit to Stand: Supervision         General transfer comment: cues for hand placement, right leg  Ambulation/Gait Ambulation/Gait assistance: Min guard Ambulation Distance (Feet): 200 Feet Assistive device: Rolling walker (2 wheeled) Gait Pattern/deviations: Step-to pattern;Step-through pattern;Antalgic     General Gait Details: cues for sequence and posture   Stairs            Wheelchair Mobility    Modified Rankin (Stroke Patients Only)       Balance                                    Cognition Arousal/Alertness: Awake/alert Behavior During Therapy: WFL for tasks assessed/performed Overall Cognitive Status: Within Functional Limits for tasks assessed                      Exercises     General Comments        Pertinent Vitals/Pain Pain Assessment: 0-10 Pain Score: 4  Pain Location: right knee Pain Descriptors / Indicators: Sore;Aching Pain Intervention(s): Monitored during session;Repositioned;Patient requesting pain  meds-RN notified;Ice applied;Premedicated before session    Home Living Family/patient expects to be discharged to:: Private residence Living Arrangements: Spouse/significant other Available Help at Discharge: Family Type of Home: House Home Access: Ramped entrance   Home Layout: One level Home Equipment: Environmental consultantWalker - 2 wheels;Toilet riser;Shower seat      Prior Function Level of Independence: Independent with assistive device(s)      Comments: used RW PTA   PT Goals (current goals can now be found in the care plan section) Acute Rehab PT Goals Patient Stated Goal: to walk PT Goal Formulation: With patient Time For Goal Achievement: 08/04/16 Potential to Achieve Goals: Good Progress towards PT goals: Progressing toward goals    Frequency    7X/week      PT Plan Current plan remains appropriate    Co-evaluation             End of Session   Activity Tolerance: Patient tolerated treatment well Patient left: in bed;with call bell/phone within reach     Time: 1430-1451 PT Time Calculation (min) (ACUTE ONLY): 21 min  Charges:  $Gait Training: 8-22 mins                    G Codes:      Rada HayHill, Sabree Nuon Elizabeth 08/02/2016, 3:30 PM

## 2016-08-02 NOTE — Discharge Instructions (Addendum)
° °Dr. Frank Aluisio °Total Joint Specialist °[facility] °[address] °[place], [place] [address] °[phone] ° °TOTAL KNEE REPLACEMENT POSTOPERATIVE DIRECTIONS ° °Knee Rehabilitation, Guidelines Following Surgery  °Results after knee surgery are often greatly improved when you follow the exercise, range of motion and muscle strengthening exercises prescribed by your doctor. Safety measures are also important to protect the knee from further injury. Any time any of these exercises cause you to have increased pain or swelling in your knee joint, decrease the amount until you are comfortable again and slowly increase them. If you have problems or questions, call your caregiver or physical therapist for advice.  ° °HOME CARE INSTRUCTIONS  °Remove items at home which could result in a fall. This includes throw rugs or furniture in walking pathways.  °· ICE to the affected knee every three hours for 30 minutes at a time and then as needed for pain and swelling.  Continue to use ice on the knee for pain and swelling from surgery. You may notice swelling that will progress down to the foot and ankle.  This is normal after surgery.  Elevate the leg when you are not up walking on it.   °· Continue to use the breathing machine which will help keep your temperature down.  It is common for your temperature to cycle up and down following surgery, especially at night when you are not up moving around and exerting yourself.  The breathing machine keeps your lungs expanded and your temperature down. °· Do not place pillow under knee, focus on keeping the knee straight while resting ° °DIET °You may resume your previous home diet once your are discharged from the hospital. ° °DRESSING / WOUND CARE / SHOWERING °You may shower 3 days after surgery, but keep the wounds dry during showering.  You may use an occlusive plastic wrap (Press'n Seal for example), NO SOAKING/SUBMERGING IN THE BATHTUB.  If the  bandage gets wet, change with a clean dry gauze.  If the incision gets wet, pat the wound dry with a clean towel. °You may start showering once you are discharged home but do not submerge the incision under water. Just pat the incision dry and apply a dry gauze dressing on daily. °Change the surgical dressing daily and reapply a dry dressing each time. ° °ACTIVITY °Walk with your walker as instructed. °Use walker as long as suggested by your caregivers. °Avoid periods of inactivity such as sitting longer than an hour when not asleep. This helps prevent blood clots.  °You may resume a sexual relationship in one month or when given the OK by your doctor.  °You may return to work once you are cleared by your doctor.  °Do not drive a car for 6 weeks or until released by you surgeon.  °Do not drive while taking narcotics. ° °WEIGHT BEARING °Weight bearing as tolerated with assist device (walker, cane, etc) as directed, use it as long as suggested by your surgeon or therapist, typically at least 4-6 weeks. ° °POSTOPERATIVE CONSTIPATION PROTOCOL °Constipation - defined medically as fewer than three stools per week and severe constipation as less than one stool per week. ° °One of the most common issues patients have following surgery is constipation.  Even if you have a regular bowel pattern at home, your normal regimen is likely to be disrupted due to multiple reasons following surgery.  Combination of anesthesia, postoperative narcotics, change in appetite and fluid intake all can affect your bowels.    In order to avoid complications following surgery, here are some recommendations in order to help you during your recovery period. ° °Colace (docusate) - Pick up an over-the-counter form of Colace or another stool softener and take twice a day as long as you are requiring postoperative pain medications.  Take with a full glass of water daily.  If you experience loose stools or diarrhea, hold the colace until you stool forms  back up.  If your symptoms do not get better within 1 week or if they get worse, check with your doctor. ° °Dulcolax (bisacodyl) - Pick up over-the-counter and take as directed by the product packaging as needed to assist with the movement of your bowels.  Take with a full glass of water.  Use this product as needed if not relieved by Colace only.  ° °MiraLax (polyethylene glycol) - Pick up over-the-counter to have on hand.  MiraLax is a solution that will increase the amount of water in your bowels to assist with bowel movements.  Take as directed and can mix with a glass of water, juice, soda, coffee, or tea.  Take if you go more than two days without a movement. °Do not use MiraLax more than once per day. Call your doctor if you are still constipated or irregular after using this medication for 7 days in a row. ° °If you continue to have problems with postoperative constipation, please contact the office for further assistance and recommendations.  If you experience "the worst abdominal pain ever" or develop nausea or vomiting, please contact the office immediatly for further recommendations for treatment. ° °ITCHING ° If you experience itching with your medications, try taking only a single pain pill, or even half a pain pill at a time.  You can also use Benadryl over the counter for itching or also to help with sleep.  ° °TED HOSE STOCKINGS °Wear the elastic stockings on both legs for three weeks following surgery during the day but you may remove then at night for sleeping. ° °MEDICATIONS °See your medication summary on the “After Visit Summary” that the nursing staff will review with you prior to discharge.  You may have some home medications which will be placed on hold until you complete the course of blood thinner medication.  It is important for you to complete the blood thinner medication as prescribed by your surgeon.  Continue your approved medications as instructed at time of  discharge. ° °PRECAUTIONS °If you experience chest pain or shortness of breath - call 911 immediately for transfer to the hospital emergency department.  °If you develop a fever greater that 101 F, purulent drainage from wound, increased redness or drainage from wound, foul odor from the wound/dressing, or calf pain - CONTACT YOUR SURGEON.   °                                                °FOLLOW-UP APPOINTMENTS °Make sure you keep all of your appointments after your operation with your surgeon and caregivers. You should call the office at the above phone number and make an appointment for approximately two weeks after the date of your surgery or on the date instructed by your surgeon outlined in the "After Visit Summary". ° ° °RANGE OF MOTION AND STRENGTHENING EXERCISES  °Rehabilitation of the knee is important following a knee injury or   an operation. After just a few days of immobilization, the muscles of the thigh which control the knee become weakened and shrink (atrophy). Knee exercises are designed to build up the tone and strength of the thigh muscles and to improve knee motion. Often times heat used for twenty to thirty minutes before working out will loosen up your tissues and help with improving the range of motion but do not use heat for the first two weeks following surgery. These exercises can be done on a training (exercise) mat, on the floor, on a table or on a bed. Use what ever works the best and is most comfortable for you Knee exercises include:  °Leg Lifts - While your knee is still immobilized in a splint or cast, you can do straight leg raises. Lift the leg to 60 degrees, hold for 3 sec, and slowly lower the leg. Repeat 10-20 times 2-3 times daily. Perform this exercise against resistance later as your knee gets better.  °Quad and Hamstring Sets - Tighten up the muscle on the front of the thigh (Quad) and hold for 5-10 sec. Repeat this 10-20 times hourly. Hamstring sets are done by pushing the  foot backward against an object and holding for 5-10 sec. Repeat as with quad sets.  °· Leg Slides: Lying on your back, slowly slide your foot toward your buttocks, bending your knee up off the floor (only go as far as is comfortable). Then slowly slide your foot back down until your leg is flat on the floor again. °· Angel Wings: Lying on your back spread your legs to the side as far apart as you can without causing discomfort.  °A rehabilitation program following serious knee injuries can speed recovery and prevent re-injury in the future due to weakened muscles. Contact your doctor or a physical therapist for more information on knee rehabilitation.  ° °IF YOU ARE TRANSFERRED TO A SKILLED REHAB FACILITY °If the patient is transferred to a skilled rehab facility following release from the hospital, a list of the current medications will be sent to the facility for the patient to continue.  When discharged from the skilled rehab facility, please have the facility set up the patient's Home Health Physical Therapy prior to being released. Also, the skilled facility will be responsible for providing the patient with their medications at time of release from the facility to include their pain medication, the muscle relaxants, and their blood thinner medication. If the patient is still at the rehab facility at time of the two week follow up appointment, the skilled rehab facility will also need to assist the patient in arranging follow up appointment in our office and any transportation needs. ° °MAKE SURE YOU:  °Understand these instructions.  °Get help right away if you are not doing well or get worse.  ° ° °Pick up stool softner and laxative for home use following surgery while on pain medications. °Do not submerge incision under water. °Please use good hand washing techniques while changing dressing each day. °May shower starting three days after surgery. °Please use a clean towel to pat the incision dry following  showers. °Continue to use ice for pain and swelling after surgery. °Do not use any lotions or creams on the incision until instructed by your surgeon. ° °Take Xarelto for two and a half more weeks, then discontinue Xarelto. °Once the patient has completed the blood thinner regimen, then take a Baby 81 mg Aspirin daily for three more weeks. ° ° °Information   on my medicine - XARELTO® (Rivaroxaban) ° °This medication education was reviewed with me or my healthcare representative as part of my discharge preparation.  The pharmacist that spoke with me during my hospital stay was:  Glogovac,nikola, RPH ° °Why was Xarelto® prescribed for you? °Xarelto® was prescribed for you to reduce the risk of blood clots forming after orthopedic surgery. The medical term for these abnormal blood clots is venous thromboembolism (VTE). ° °What do you need to know about xarelto® ? °Take your Xarelto® ONCE DAILY at the same time every day. °You may take it either with or without food. ° °If you have difficulty swallowing the tablet whole, you may crush it and mix in applesauce just prior to taking your dose. ° °Take Xarelto® exactly as prescribed by your doctor and DO NOT stop taking Xarelto® without talking to the doctor who prescribed the medication.  Stopping without other VTE prevention medication to take the place of Xarelto® may increase your risk of developing a clot. ° °After discharge, you should have regular check-up appointments with your healthcare provider that is prescribing your Xarelto®.   ° °What do you do if you miss a dose? °If you miss a dose, take it as soon as you remember on the same day then continue your regularly scheduled once daily regimen the next day. Do not take two doses of Xarelto® on the same day.  ° °Important Safety Information °A possible side effect of Xarelto® is bleeding. You should call your healthcare provider right away if you experience any of the following: °? Bleeding from an injury or your  nose that does not stop. °? Unusual colored urine (red or dark brown) or unusual colored stools (red or black). °? Unusual bruising for unknown reasons. °? A serious fall or if you hit your head (even if there is no bleeding). ° °Some medicines may interact with Xarelto® and might increase your risk of bleeding while on Xarelto®. To help avoid this, consult your healthcare provider or pharmacist prior to using any new prescription or non-prescription medications, including herbals, vitamins, non-steroidal anti-inflammatory drugs (NSAIDs) and supplements. ° °This website has more information on Xarelto®: www.xarelto.com. ° ° °

## 2016-08-02 NOTE — Care Management Note (Signed)
Case Management Note  Patient Details  Name: DESHAWNDA ACREY MRN: [id number] Date of Birth: 07-Jun-1951  Subjective/Objective:                  Right  Total Knee Arthroplasty Action/Plan: Discharge planning Expected Discharge Date:  08/03/16              Expected Discharge Plan:  Home/Self Care  In-House Referral:     Discharge planning Services  CM Consult  Post Acute Care Choice:  NA Choice offered to:  Patient  DME Arranged:  N/A DME Agency:  NA  HH Arranged:  NA HH Agency:  NA  Status of Service:  Completed, signed off  If discussed at [facility] of Stay Meetings, dates discussed:    Additional Comments: CM met with pt in room to confirm plan is for Virtual PT; pt confirms and states she has both a rolling walker and a 3n1 at home.  No other Cm needs were communicated. Dellie Catholic, RN 08/02/2016, 1:39 PM

## 2016-08-03 LAB — CBC
HCT: 34.5 % — ABNORMAL LOW (ref 36.0–46.0)
HEMOGLOBIN: 11.5 g/dL — AB (ref 12.0–15.0)
MCH: 30.2 pg (ref 26.0–34.0)
MCHC: 33.3 g/dL (ref 30.0–36.0)
MCV: 90.6 fL (ref 78.0–100.0)
PLATELETS: 151 10*3/uL (ref 150–400)
RBC: 3.81 MIL/uL — AB (ref 3.87–5.11)
RDW: 14.8 % (ref 11.5–15.5)
WBC: 11.1 10*3/uL — ABNORMAL HIGH (ref 4.0–10.5)

## 2016-08-03 LAB — BASIC METABOLIC PANEL
Anion gap: 5 (ref 5–15)
BUN: 25 mg/dL — AB (ref 6–20)
CO2: 27 mmol/L (ref 22–32)
CREATININE: 0.82 mg/dL (ref 0.44–1.00)
Calcium: 8.7 mg/dL — ABNORMAL LOW (ref 8.9–10.3)
Chloride: 108 mmol/L (ref 101–111)
Glucose, Bld: 112 mg/dL — ABNORMAL HIGH (ref 65–99)
Potassium: 4.3 mmol/L (ref 3.5–5.1)
SODIUM: 140 mmol/L (ref 135–145)

## 2016-08-03 NOTE — Progress Notes (Signed)
   Subjective: 2 Days Post-Op Procedure(s) (LRB): TOTAL KNEE ARTHROPLASTY (Right) Patient reports pain as mild.   Patient seen in rounds with Dr. Lequita HaltAluisio. Patient is well, and has had no acute complaints or problems Patient is ready to go home  Objective: Vital signs in last 24 hours: Temp:  [98.4 F (36.9 C)-99.7 F (37.6 C)] 99.6 F (37.6 C) (12/13 0607) Pulse Rate:  [58-70] 63 (12/13 0607) Resp:  [16-18] 18 (12/13 0607) BP: (116-154)/(40-75) 154/75 (12/13 0607) SpO2:  [93 %-98 %] 98 % (12/13 0607)  Intake/Output from previous day:  Intake/Output Summary (Last 24 hours) at 08/03/16 0728 Last data filed at 08/03/16 0239  Gross per 24 hour  Intake             1680 ml  Output             1650 ml  Net               30 ml    Intake/Output this shift: No intake/output data recorded.  Labs:  Recent Labs  08/02/16 0502 08/03/16 0415  HGB 11.9* 11.5*    Recent Labs  08/02/16 0502 08/03/16 0415  WBC 11.8* 11.1*  RBC 4.00 3.81*  HCT 36.1 34.5*  PLT 182 151    Recent Labs  08/02/16 0502 08/03/16 0415  NA 137 140  K 4.4 4.3  CL 105 108  CO2 27 27  BUN 20 25*  CREATININE 0.81 0.82  GLUCOSE 128* 112*  CALCIUM 8.6* 8.7*   No results for input(s): LABPT, INR in the last 72 hours.  EXAM: General - Patient is Alert, Appropriate and Oriented Extremity - Neurovascular intact Sensation intact distally Intact pulses distally Dorsiflexion/Plantar flexion intact Incision - clean, dry, no drainage Motor Function - intact, moving foot and toes well on exam.   Assessment/Plan: 2 Days Post-Op Procedure(s) (LRB): TOTAL KNEE ARTHROPLASTY (Right) Procedure(s) (LRB): TOTAL KNEE ARTHROPLASTY (Right) Past Medical History:  Diagnosis Date  . Arthritis   . CHF (congestive heart failure) (HCC)   . Essential hypertension 10/15/2015  . Heart murmur   . Hypertension   . Hypothyroidism 10/15/2015  . 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"    Principal Problem:   OA (osteoarthritis) of knee  Estimated body mass index is 36.8 kg/m as calculated from the following:   Height as of this encounter: 5\' 6"  (1.676 m).   Weight as of this encounter: 103.4 kg (228 lb). Up with therapy Diet - Cardiac diet Follow up - in 2 weeks Activity - WBAT Disposition - Home Condition Upon Discharge - Good D/C Meds - See DC Summary DVT Prophylaxis - Xarelto  Avel Peacerew Perkins, PA-C Orthopaedic Surgery 08/03/2016, 7:28 AM

## 2016-08-03 NOTE — Progress Notes (Signed)
Physical Therapy Treatment Patient Details Name: Mariel SleetGwendolyn J Lamountain MRN: [id number] DOB: 01/13/1951 Today's Date: 08/03/2016    History of Present Illness R tka    PT Comments    The patient is progressing well. Ready for DC  Follow Up Recommendations   (virtual PT)     Equipment Recommendations  None recommended by PT    Recommendations for Other Services       Precautions / Restrictions Precautions Precautions: Fall;Knee    Mobility  Bed Mobility Overal bed mobility: Needs Assistance Bed Mobility: Sit to Supine       Sit to supine: Min guard      Transfers Overall transfer level: Needs assistance Equipment used: Rolling walker (2 wheeled) Transfers: Sit to/from Stand Sit to Stand: Supervision         General transfer comment: cues for hand placement, right leg  Ambulation/Gait Ambulation/Gait assistance: Min guard Ambulation Distance (Feet): 100 Feet Assistive device: Rolling walker (2 wheeled) Gait Pattern/deviations: Step-through pattern     General Gait Details: cues for posture   Stairs            Wheelchair Mobility    Modified Rankin (Stroke Patients Only)       Balance                                    Cognition Arousal/Alertness: Awake/alert                          Exercises Total Joint Exercises Ankle Circles/Pumps: AROM;Right;Left;10 reps Quad Sets: AROM;Right;Left;10 reps Towel Squeeze: AROM;Right;10 reps Short Arc Quad: AROM;Right;10 reps Heel Slides: AROM;Right;10 reps Hip ABduction/ADduction: AROM;Right;10 reps Straight Leg Raises: AROM;Right;10 reps Goniometric ROM: 10-50 knee flexion    General Comments        Pertinent Vitals/Pain Pain Descriptors / Indicators: Sore;Aching Pain Intervention(s): Monitored during session;Premedicated before session    Home Living                      Prior Function            PT Goals (current goals can now be found in the care  plan section) Progress towards PT goals: Progressing toward goals    Frequency    7X/week      PT Plan Current plan remains appropriate    Co-evaluation             End of Session   Activity Tolerance: Patient tolerated treatment well Patient left: in bed;with call bell/phone within reach     Time: 1005-1033 PT Time Calculation (min) (ACUTE ONLY): 28 min  Charges:  $Gait Training: 8-22 mins $Therapeutic Exercise: 8-22 mins                    G Codes:      Rada HayHill, Geanna Divirgilio Elizabeth 08/03/2016, 1:23 PM

## 2016-08-03 NOTE — Progress Notes (Signed)
08/03/16  1030  Reviewed discharge instructions with patient. Patient verbalized understanding of discharge instructions. Copy of discharge instructions and prescriptions given to patient. 

## 2016-08-03 NOTE — Progress Notes (Signed)
Pt continues to run a low grade fever of 99.6 even after increasing her IS use.  Denies dysuria. [name] [name]

## 2016-08-04 LAB — TYPE AND SCREEN
ABO/RH(D): B NEG
ANTIBODY SCREEN: POSITIVE
DAT, IgG: NEGATIVE
DONOR AG TYPE: NEGATIVE
Donor AG Type: NEGATIVE
Unit division: 0
Unit division: 0

## 2016-08-05 DIAGNOSIS — M25661 Stiffness of right knee, not elsewhere classified: Secondary | ICD-10-CM | POA: Diagnosis not present

## 2016-08-17 DIAGNOSIS — Z471 Aftercare following joint replacement surgery: Secondary | ICD-10-CM | POA: Diagnosis not present

## 2016-08-17 DIAGNOSIS — M25661 Stiffness of right knee, not elsewhere classified: Secondary | ICD-10-CM | POA: Diagnosis not present

## 2016-08-17 DIAGNOSIS — Z96651 Presence of right artificial knee joint: Secondary | ICD-10-CM | POA: Diagnosis not present

## 2016-09-09 DIAGNOSIS — Z471 Aftercare following joint replacement surgery: Secondary | ICD-10-CM | POA: Diagnosis not present

## 2016-09-09 DIAGNOSIS — Z96651 Presence of right artificial knee joint: Secondary | ICD-10-CM | POA: Diagnosis not present

## 2016-09-26 DIAGNOSIS — E559 Vitamin D deficiency, unspecified: Secondary | ICD-10-CM | POA: Diagnosis not present

## 2016-09-26 DIAGNOSIS — E039 Hypothyroidism, unspecified: Secondary | ICD-10-CM | POA: Diagnosis not present

## 2016-09-26 DIAGNOSIS — I10 Essential (primary) hypertension: Secondary | ICD-10-CM | POA: Diagnosis not present

## 2016-09-26 DIAGNOSIS — I129 Hypertensive chronic kidney disease with stage 1 through stage 4 chronic kidney disease, or unspecified chronic kidney disease: Secondary | ICD-10-CM | POA: Diagnosis not present

## 2016-10-03 DIAGNOSIS — E559 Vitamin D deficiency, unspecified: Secondary | ICD-10-CM | POA: Diagnosis not present

## 2016-10-03 DIAGNOSIS — E039 Hypothyroidism, unspecified: Secondary | ICD-10-CM | POA: Diagnosis not present

## 2016-10-03 DIAGNOSIS — F329 Major depressive disorder, single episode, unspecified: Secondary | ICD-10-CM | POA: Diagnosis not present

## 2016-10-03 DIAGNOSIS — I10 Essential (primary) hypertension: Secondary | ICD-10-CM | POA: Diagnosis not present

## 2016-10-21 DIAGNOSIS — Z96651 Presence of right artificial knee joint: Secondary | ICD-10-CM | POA: Diagnosis not present

## 2016-10-21 DIAGNOSIS — Z96652 Presence of left artificial knee joint: Secondary | ICD-10-CM | POA: Diagnosis not present

## 2016-10-21 DIAGNOSIS — Z471 Aftercare following joint replacement surgery: Secondary | ICD-10-CM | POA: Diagnosis not present

## 2017-03-27 DIAGNOSIS — I10 Essential (primary) hypertension: Secondary | ICD-10-CM | POA: Diagnosis not present

## 2017-03-27 DIAGNOSIS — E559 Vitamin D deficiency, unspecified: Secondary | ICD-10-CM | POA: Diagnosis not present

## 2017-03-27 DIAGNOSIS — Z0000 Encounter for general adult medical examination without abnormal findings: Secondary | ICD-10-CM | POA: Diagnosis not present

## 2017-03-27 DIAGNOSIS — E039 Hypothyroidism, unspecified: Secondary | ICD-10-CM | POA: Diagnosis not present

## 2017-04-04 DIAGNOSIS — I509 Heart failure, unspecified: Secondary | ICD-10-CM | POA: Diagnosis not present

## 2017-04-04 DIAGNOSIS — E559 Vitamin D deficiency, unspecified: Secondary | ICD-10-CM | POA: Diagnosis not present

## 2017-04-04 DIAGNOSIS — M8580 Other specified disorders of bone density and structure, unspecified site: Secondary | ICD-10-CM | POA: Diagnosis not present

## 2017-04-04 DIAGNOSIS — I10 Essential (primary) hypertension: Secondary | ICD-10-CM | POA: Diagnosis not present

## 2017-04-04 DIAGNOSIS — E039 Hypothyroidism, unspecified: Secondary | ICD-10-CM | POA: Diagnosis not present

## 2017-04-04 DIAGNOSIS — M859 Disorder of bone density and structure, unspecified: Secondary | ICD-10-CM | POA: Diagnosis not present

## 2017-05-31 DIAGNOSIS — M1711 Unilateral primary osteoarthritis, right knee: Secondary | ICD-10-CM | POA: Diagnosis not present

## 2017-06-05 DIAGNOSIS — H1132 Conjunctival hemorrhage, left eye: Secondary | ICD-10-CM | POA: Diagnosis not present

## 2017-06-13 DIAGNOSIS — Z23 Encounter for immunization: Secondary | ICD-10-CM | POA: Diagnosis not present

## 2017-10-04 DIAGNOSIS — E559 Vitamin D deficiency, unspecified: Secondary | ICD-10-CM | POA: Diagnosis not present

## 2017-10-04 DIAGNOSIS — E669 Obesity, unspecified: Secondary | ICD-10-CM | POA: Diagnosis not present

## 2017-10-04 DIAGNOSIS — F3340 Major depressive disorder, recurrent, in remission, unspecified: Secondary | ICD-10-CM | POA: Diagnosis not present

## 2017-10-04 DIAGNOSIS — K219 Gastro-esophageal reflux disease without esophagitis: Secondary | ICD-10-CM | POA: Diagnosis not present

## 2017-10-04 DIAGNOSIS — E039 Hypothyroidism, unspecified: Secondary | ICD-10-CM | POA: Diagnosis not present

## 2017-10-04 DIAGNOSIS — I129 Hypertensive chronic kidney disease with stage 1 through stage 4 chronic kidney disease, or unspecified chronic kidney disease: Secondary | ICD-10-CM | POA: Diagnosis not present

## 2017-10-04 DIAGNOSIS — R001 Bradycardia, unspecified: Secondary | ICD-10-CM | POA: Diagnosis not present

## 2017-10-20 DIAGNOSIS — R002 Palpitations: Secondary | ICD-10-CM | POA: Diagnosis not present

## 2017-10-20 DIAGNOSIS — R001 Bradycardia, unspecified: Secondary | ICD-10-CM | POA: Diagnosis not present

## 2017-11-01 ENCOUNTER — Telehealth: Payer: Self-pay | Admitting: Physician Assistant

## 2017-11-01 NOTE — Telephone Encounter (Signed)
Received [facility] from [facility] for upcoming appointment on 11/07/17 @ 8:30am with Azalee CourseHao Meng. Records located in Medical Records. 11/01/17 ab

## 2017-11-06 ENCOUNTER — Encounter: Payer: Self-pay | Admitting: *Deleted

## 2017-11-07 ENCOUNTER — Ambulatory Visit (INDEPENDENT_AMBULATORY_CARE_PROVIDER_SITE_OTHER): Payer: Medicare Other | Admitting: Physician Assistant

## 2017-11-07 ENCOUNTER — Encounter: Payer: Self-pay | Admitting: Physician Assistant

## 2017-11-07 VITALS — BP 128/78 | HR 51 | Ht 67.0 in | Wt 278.0 lb

## 2017-11-07 DIAGNOSIS — I10 Essential (primary) hypertension: Secondary | ICD-10-CM | POA: Diagnosis not present

## 2017-11-07 DIAGNOSIS — R001 Bradycardia, unspecified: Secondary | ICD-10-CM

## 2017-11-07 DIAGNOSIS — E039 Hypothyroidism, unspecified: Secondary | ICD-10-CM | POA: Diagnosis not present

## 2017-11-07 NOTE — Patient Instructions (Signed)
Medication Instructions:  Your physician recommends that you continue on your current medications as directed. Please refer to the Current Medication list given to you today.  Labwork: None   Testing/Procedures: Your physician has recommended that you wear a 24HOUR holter monitor. Holter monitors are medical devices that record the heart's electrical activity. Doctors most often use these monitors to diagnose arrhythmias. Arrhythmias are problems with the speed or rhythm of the heartbeat. The monitor is a small, portable device. You can wear one while you do your normal daily activities. This is usually used to diagnose what is causing palpitations/syncope (passing out). MONITOR WILL BE PLACED AT OUR [facility] LOCATION: [address]  Follow-Up: Your physician recommends that you schedule a follow-up appointment in: 3 MONTHS WITH DR Allyson SabalBERRY.  Any Other Special Instructions Will Be Listed Below (If Applicable). If you need a refill on your cardiac medications before your next appointment, please call your pharmacy.

## 2017-11-07 NOTE — Progress Notes (Signed)
Cardiology Office Note    Date:  11/07/2017   ID:  YARIXA LIGHTCAP, DOB [birth year], MRN [id number]  PCP:  [name], MD  Cardiologist:  New    Chief Complaint  Patient presents with  . New Patient (Initial Visit)    bradycardia, referred by Dr. Renne Crigler for bradycardia and palpitation.    History of Present Illness:  [name] is a 67 y.o. female with PMH of HTN and hypothyroidism who presents today for evaluation of bradycardia with HR 40-50s on metoprolol, however palpitation with reduced dose of metoprolol.  She has had echocardiogram on 11/12/2015 that showed EF 55-60%, grade 1 DD.  She is currently on 100 mg twice daily of metoprolol, however has excessive palpitation when the dose of metoprolol is decreased.  Patient presents today along with her son.  Her last TSH in August 2018 was normal.  She says she has been on metoprolol for the past 30 years.  Unfortunately she is just lost her husband in July of last year from Lewy body dementia.  She is currently living with one of her son, with another son living next door.  She used to work for [employer].  She says she was seen remotely by Dr. Rennis Golden, however that was before electronic medical record system.  She has a long-standing history of palpitation, she did mention she has heart monitor before although that was more than 10 years ago.  She denies any recent exertional chest pain or shortness of breath.  She does have bad knee and has to walk with a walker.  Otherwise, she says she is able to do everything at home without any issue.    As far as metoprolol, she did try to decrease the dose of metoprolol slowly.  She initially decreased her metoprolol tartrate to 100 mg a.m. and 50 mg p.m., then gradually decrease it down to 50 mg twice daily after a week.  She noticed significant increase in the amount of palpitation.  The palpitation can occur at any time and only last a few seconds each.  She is not aware of any  prior history of atrial fibrillation or PVCs.  At this time, she has already resumed 100 mg twice daily of metoprolol.  She says despite the slow heart rate, she actually has no symptom at all.  She denies any significant weakness, dizziness, blurred vision or feeling of passing out.  We do not necessarily need to correct her bradycardia unless she is symptomatic.  She occasionally has heart rate dipping down to the 48 range, however as long as she is asymptomatic, she does not necessarily need to correct this.  I wished to do a 24 hour Holter monitor at this time.  She has extremely mild murmur on physical exam, it sounds very mild, no need to repeat echo.  Previous echocardiogram in 2017 was good.   Past Medical History:  Diagnosis Date  . Arthritis   . CHF (congestive heart failure) (HCC)   . Essential hypertension 10/15/2015  . Heart murmur   . Hypertension   . Hypothyroidism 10/15/2015  . 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"    Past Surgical History:  Procedure Laterality Date  . CHOLECYSTECTOMY    . DILATION AND CURETTAGE OF UTERUS    . OMENTECTOMY N/A 10/12/2015   Procedure: PARTIAL OMENTECTOMY;  Surgeon: Violeta Gelinas, MD;  Location: [facility] OR;  Service: General;  Laterality: N/A;  . TOTAL KNEE  ARTHROPLASTY Left 04/04/2016   Procedure: LEFT TOTAL KNEE ARTHROPLASTY;  Surgeon: Ollen Gross, MD;  Location: [facility] ORS;  Service: Orthopedics;  Laterality: Left;  . TOTAL KNEE ARTHROPLASTY Right 08/01/2016   Procedure: TOTAL KNEE ARTHROPLASTY;  Surgeon: Ollen Gross, MD;  Location: [facility] ORS;  Service: Orthopedics;  Laterality: Right;  . UMBILICAL HERNIA REPAIR N/A 10/12/2015   Procedure: HERNIA REPAIR UMBILICAL ADULT/INCARERATED;  Surgeon: Violeta Gelinas, MD;  Location: [facility] OR;  Service: General;  Laterality: N/A;    Current Medications: Outpatient Medications Prior to Visit  Medication Sig Dispense Refill  . acetaminophen (TYLENOL) 650 MG CR tablet Take 1,300 mg by  mouth at bedtime.    . Calcium Carbonate (CALCIUM 600 PO) Take 1 tablet by mouth daily.    . furosemide (LASIX) 20 MG tablet Take 40 mg by mouth every morning.     Marland Kitchen levothyroxine (SYNTHROID, LEVOTHROID) 75 MCG tablet Take 75 mcg by mouth daily before breakfast.   2  . metoprolol (LOPRESSOR) 100 MG tablet Take 100 mg by mouth 2 (two) times daily.     . Vitamin D, Cholecalciferol, 1000 units TABS Take 2,000 Units by mouth daily.    . methocarbamol (ROBAXIN) 500 MG tablet Take 1 tablet (500 mg total) by mouth every 6 (six) hours as needed for muscle spasms. 80 tablet 0  . oxyCODONE (OXY IR/ROXICODONE) 5 MG immediate release tablet Take 1-2 tablets (5-10 mg total) by mouth every 3 (three) hours as needed for moderate pain or severe pain. 80 tablet 0  . rivaroxaban (XARELTO) 10 MG TABS tablet Take 1 tablet (10 mg total) by mouth daily with breakfast. Take Xarelto for two and a half more weeks, then discontinue Xarelto. Once the patient has completed the blood thinner regimen, then take a Baby 81 mg Aspirin daily for three more weeks. 19 tablet 0  . traMADol (ULTRAM) 50 MG tablet Take 1-2 tablets (50-100 mg total) by mouth every 6 (six) hours as needed (mild pain). 80 tablet 1   No facility-administered medications prior to visit.      Allergies:   Augmentin [amoxicillin-pot clavulanate] and Pneumococcal vaccines   Social History   Socioeconomic History  . Marital status: Widowed    Spouse name: None  . Number of children: None  . Years of education: None  . Highest education level: None  Social Needs  . Financial resource strain: None  . Food insecurity - worry: None  . Food insecurity - inability: None  . Transportation needs - medical: None  . Transportation needs - non-medical: None  Occupational History  . None  Tobacco Use  . Smoking status: Former Smoker    Last attempt to quit: 03/29/1987    Years since quitting: 30.6  . Smokeless tobacco: Never Used  Substance and Sexual  Activity  . Alcohol use: No  . Drug use: No  . Sexual activity: None  Other Topics Concern  . None  Social History Narrative  . None     Family History:  The patient's family history includes Heart attack in her father; Heart disease in her father; Rheum arthritis in her mother.   ROS:   Please see the history of present illness.    ROS All other systems reviewed and are negative.   PHYSICAL EXAM:   VS:  BP 128/78   Pulse (!) 51   Ht 5\' 7"  (1.702 m)   Wt 278 lb (126.1 kg)   BMI 43.54 kg/m    GEN: Well nourished, well  developed, in no acute distress  HEENT: normal  Neck: no JVD, carotid bruits, or masses Cardiac: RRR; no rubs, or gallops,no edema  1/6 systolic murmur at RUSB Respiratory:  clear to auscultation bilaterally, normal work of breathing GI: soft, nontender, nondistended, + BS MS: no deformity or atrophy  Skin: warm and dry, no rash Neuro:  Alert and Oriented x 3, Strength and sensation are intact Psych: euthymic mood, full affect  Wt Readings from Last 3 Encounters:  11/07/17 278 lb (126.1 kg)  08/01/16 228 lb (103.4 kg)  07/27/16 228 lb (103.4 kg)      Studies/Labs Reviewed:   EKG:  EKG is ordered today.  The ekg ordered today demonstrates sinus bradycardia, no significant ST-T wave changes  Recent Labs: No results found for requested labs within last 8760 hours.   Lipid Panel No results found for: CHOL, TRIG, HDL, CHOLHDL, VLDL, LDLCALC, LDLDIRECT  Additional studies/ records that were reviewed today include:   Echo 11/12/2015 LV EF: 55% -   60% Study Conclusions  - Left ventricle: The cavity size was normal. Wall thickness was   normal. Systolic function was normal. The estimated ejection   fraction was in the range of 55% to 60%. Wall motion was normal;   there were no regional wall motion abnormalities. Doppler   parameters are consistent with abnormal left ventricular   relaxation (grade 1 diastolic dysfunction). - Mitral valve:  Calcified annulus. - Pulmonary arteries: Systolic pressure was mildly increased.  Impressions:  - Normal LV systolic function; grade 1 diastolic dysfunction; trace   TR; mildly elevated pulmonary pressure    ASSESSMENT:    1. Bradycardia   2. Hypothyroidism, unspecified type   3. Essential hypertension      PLAN:  In order of problems listed above:  1. Bradycardia: Currently on 100 mg twice daily of metoprolol, with reduction of metoprolol, she has significant palpitation.  EKG today shows heart rate of 51, she says she occasionally has heart rate dipping down to the high 40s.  As long as she is asymptomatic, she can continue on the metoprolol.  I will obtain a 24 hour holter monitor to assess her average heart rate and also to see if she has any irregular rhythm.  The case was discussed with DOD Dr. Allyson SabalBerry who also agrees.  2. Hypothyroidism: On Synthroid, last TSH in 2018 was normal.  3. Hypertension: Blood pressure well controlled on current dose of metoprolol    Medication Adjustments/Labs and Tests Ordered: Current medicines are reviewed at length with the patient today.  Concerns regarding medicines are outlined above.  Medication changes, Labs and Tests ordered today are listed in the Patient Instructions below. Patient Instructions  Medication Instructions:  Your physician recommends that you continue on your current medications as directed. Please refer to the Current Medication list given to you today.  Labwork: None   Testing/Procedures: Your physician has recommended that you wear a 24HOUR holter monitor. Holter monitors are medical devices that record the heart's electrical activity. Doctors most often use these monitors to diagnose arrhythmias. Arrhythmias are problems with the speed or rhythm of the heartbeat. The monitor is a small, portable device. You can wear one while you do your normal daily activities. This is usually used to diagnose what is causing  palpitations/syncope (passing out). MONITOR WILL BE PLACED AT OUR [facility] LOCATION: [address]  Follow-Up: Your physician recommends that you schedule a follow-up appointment in: 3 MONTHS WITH DR Allyson SabalBERRY.  Any Other Special Instructions Will Be Listed Below (If Applicable). If you need a refill on your cardiac medications before your next appointment, please call your pharmacy.     Ramond Dial, [place]  11/07/2017 9:24 AM    [facility] [address] [place], [place], [place]  [address] Phone: [phone]; Fax: [phone]

## 2017-11-09 ENCOUNTER — Ambulatory Visit (INDEPENDENT_AMBULATORY_CARE_PROVIDER_SITE_OTHER): Payer: Medicare Other

## 2017-11-09 DIAGNOSIS — R001 Bradycardia, unspecified: Secondary | ICD-10-CM

## 2017-11-09 DIAGNOSIS — R002 Palpitations: Secondary | ICD-10-CM | POA: Diagnosis not present

## 2017-11-27 DIAGNOSIS — J45909 Unspecified asthma, uncomplicated: Secondary | ICD-10-CM | POA: Diagnosis not present

## 2017-12-07 ENCOUNTER — Observation Stay
Admission: EM | Admit: 2017-12-07 | Discharge: 2017-12-09 | Disposition: A | Discharge status: Home / Self Care | Payer: Medicare Other | Attending: Internal Medicine | Admitting: Internal Medicine

## 2017-12-07 DIAGNOSIS — Z96653 Presence of artificial knee joint, bilateral: Secondary | ICD-10-CM | POA: Diagnosis not present

## 2017-12-07 DIAGNOSIS — Z87891 Personal history of nicotine dependence: Secondary | ICD-10-CM | POA: Insufficient documentation

## 2017-12-07 DIAGNOSIS — Z79890 Hormone replacement therapy (postmenopausal): Secondary | ICD-10-CM | POA: Diagnosis not present

## 2017-12-07 DIAGNOSIS — I872 Venous insufficiency (chronic) (peripheral): Secondary | ICD-10-CM | POA: Diagnosis not present

## 2017-12-07 DIAGNOSIS — I5032 Chronic diastolic (congestive) heart failure: Secondary | ICD-10-CM | POA: Insufficient documentation

## 2017-12-07 DIAGNOSIS — E039 Hypothyroidism, unspecified: Secondary | ICD-10-CM | POA: Diagnosis not present

## 2017-12-07 DIAGNOSIS — R197 Diarrhea, unspecified: Secondary | ICD-10-CM | POA: Diagnosis not present

## 2017-12-07 DIAGNOSIS — R002 Palpitations: Secondary | ICD-10-CM | POA: Insufficient documentation

## 2017-12-07 DIAGNOSIS — R1012 Left upper quadrant pain: Secondary | ICD-10-CM

## 2017-12-07 DIAGNOSIS — Z79899 Other long term (current) drug therapy: Secondary | ICD-10-CM | POA: Diagnosis not present

## 2017-12-07 DIAGNOSIS — I110 Hypertensive heart disease with heart failure: Secondary | ICD-10-CM | POA: Insufficient documentation

## 2017-12-07 DIAGNOSIS — R109 Unspecified abdominal pain: Secondary | ICD-10-CM | POA: Diagnosis present

## 2017-12-07 DIAGNOSIS — D735 Infarction of spleen: Principal | ICD-10-CM | POA: Insufficient documentation

## 2017-12-07 LAB — CBC
HCT: 38.9 % (ref 35.0–47.0)
Hemoglobin: 13.2 g/dL (ref 12.0–16.0)
MCH: 29.9 pg (ref 26.0–34.0)
MCHC: 33.9 g/dL (ref 32.0–36.0)
MCV: 88.2 fL (ref 80.0–100.0)
PLATELETS: 223 10*3/uL (ref 150–440)
RBC: 4.41 MIL/uL (ref 3.80–5.20)
RDW: 14.4 % (ref 11.5–14.5)
WBC: 11.2 10*3/uL — AB (ref 3.6–11.0)

## 2017-12-07 LAB — COMPREHENSIVE METABOLIC PANEL
ALK PHOS: 114 U/L (ref 38–126)
ALT: 18 U/L (ref 14–54)
ANION GAP: 7 (ref 5–15)
AST: 36 U/L (ref 15–41)
Albumin: 3.9 g/dL (ref 3.5–5.0)
BUN: 39 mg/dL — ABNORMAL HIGH (ref 6–20)
CALCIUM: 8.8 mg/dL — AB (ref 8.9–10.3)
CO2: 29 mmol/L (ref 22–32)
CREATININE: 0.8 mg/dL (ref 0.44–1.00)
Chloride: 103 mmol/L (ref 101–111)
GFR calc Af Amer: >60 mL/min (ref >60)
Glucose, Bld: 104 mg/dL — ABNORMAL HIGH (ref 65–99)
Potassium: 3.8 mmol/L (ref 3.5–5.1)
Sodium: 139 mmol/L (ref 135–145)
TOTAL PROTEIN: 7.6 g/dL (ref 6.5–8.1)
Total Bilirubin: 0.5 mg/dL (ref 0.3–1.2)

## 2017-12-07 LAB — TROPONIN I

## 2017-12-07 LAB — LIPASE, BLOOD: Lipase: 40 U/L (ref 11–51)

## 2017-12-07 NOTE — ED Triage Notes (Signed)
Patient c/o mid/upper abdominal pain, nausea, diarrhea beginning at 1630. Patient denies emesis.

## 2017-12-08 ENCOUNTER — Other Ambulatory Visit: Payer: Self-pay

## 2017-12-08 ENCOUNTER — Emergency Department: Payer: Medicare Other

## 2017-12-08 ENCOUNTER — Observation Stay (HOSPITAL_BASED_OUTPATIENT_CLINIC_OR_DEPARTMENT_OTHER)
Admit: 2017-12-08 | Discharge: 2017-12-08 | Disposition: A | Discharge status: Home / Self Care | Payer: Medicare Other | Attending: Internal Medicine | Admitting: Internal Medicine

## 2017-12-08 DIAGNOSIS — I5032 Chronic diastolic (congestive) heart failure: Secondary | ICD-10-CM | POA: Diagnosis not present

## 2017-12-08 DIAGNOSIS — I482 Chronic atrial fibrillation: Secondary | ICD-10-CM | POA: Diagnosis not present

## 2017-12-08 DIAGNOSIS — R197 Diarrhea, unspecified: Secondary | ICD-10-CM | POA: Diagnosis not present

## 2017-12-08 DIAGNOSIS — D735 Infarction of spleen: Secondary | ICD-10-CM | POA: Diagnosis not present

## 2017-12-08 DIAGNOSIS — I872 Venous insufficiency (chronic) (peripheral): Secondary | ICD-10-CM | POA: Diagnosis not present

## 2017-12-08 DIAGNOSIS — R109 Unspecified abdominal pain: Secondary | ICD-10-CM | POA: Diagnosis not present

## 2017-12-08 DIAGNOSIS — I10 Essential (primary) hypertension: Secondary | ICD-10-CM | POA: Diagnosis not present

## 2017-12-08 DIAGNOSIS — E039 Hypothyroidism, unspecified: Secondary | ICD-10-CM | POA: Diagnosis not present

## 2017-12-08 LAB — URINALYSIS, COMPLETE (UACMP) WITH MICROSCOPIC
Bilirubin Urine: NEGATIVE
GLUCOSE, UA: NEGATIVE mg/dL
Hgb urine dipstick: NEGATIVE
KETONES UR: NEGATIVE mg/dL
Nitrite: NEGATIVE
PH: 6 (ref 5.0–8.0)
Protein, ur: NEGATIVE mg/dL
SPECIFIC GRAVITY, URINE: 1.012 (ref 1.005–1.030)

## 2017-12-08 LAB — PROTIME-INR
INR: 1.13
Prothrombin Time: 14.4 seconds (ref 11.4–15.2)

## 2017-12-08 LAB — TSH: TSH: 1.591 u[IU]/mL (ref 0.350–4.500)

## 2017-12-08 LAB — ECHOCARDIOGRAM COMPLETE
Height: 67 in
Weight: 4467.2 oz

## 2017-12-08 LAB — HEPARIN LEVEL (UNFRACTIONATED)
HEPARIN UNFRACTIONATED: 0.93 [IU]/mL — AB (ref 0.30–0.70)
Heparin Unfractionated: 0.89 IU/mL — ABNORMAL HIGH (ref 0.30–0.70)

## 2017-12-08 LAB — APTT: APTT: 30 s (ref 24–36)

## 2017-12-08 MED ORDER — SODIUM CHLORIDE 0.9 % IV BOLUS
500.0000 mL | Freq: Once | INTRAVENOUS | Status: AC
  Administered 2017-12-08: 500 mL via INTRAVENOUS

## 2017-12-08 MED ORDER — ONDANSETRON HCL 4 MG/2ML IJ SOLN
4.0000 mg | Freq: Once | INTRAMUSCULAR | Status: AC
  Administered 2017-12-08: 4 mg via INTRAVENOUS
  Filled 2017-12-08: qty 2

## 2017-12-08 MED ORDER — METOPROLOL TARTRATE 50 MG PO TABS
100.0000 mg | ORAL_TABLET | Freq: Two times a day (BID) | ORAL | Status: DC
  Administered 2017-12-08 – 2017-12-09 (×3): 100 mg via ORAL
  Filled 2017-12-08 (×3): qty 2

## 2017-12-08 MED ORDER — LEVOTHYROXINE SODIUM 50 MCG PO TABS
75.0000 ug | ORAL_TABLET | Freq: Every day | ORAL | Status: DC
  Administered 2017-12-08 – 2017-12-09 (×2): 75 ug via ORAL
  Filled 2017-12-08 (×2): qty 2

## 2017-12-08 MED ORDER — CALCIUM CARBONATE ANTACID 500 MG PO CHEW
1.0000 | CHEWABLE_TABLET | Freq: Two times a day (BID) | ORAL | Status: DC
  Administered 2017-12-08 – 2017-12-09 (×3): 200 mg via ORAL
  Filled 2017-12-08 (×3): qty 1

## 2017-12-08 MED ORDER — FUROSEMIDE 40 MG PO TABS
40.0000 mg | ORAL_TABLET | Freq: Every morning | ORAL | Status: DC
  Administered 2017-12-08 – 2017-12-09 (×2): 40 mg via ORAL
  Filled 2017-12-08 (×2): qty 1

## 2017-12-08 MED ORDER — ACETAMINOPHEN 325 MG PO TABS
650.0000 mg | ORAL_TABLET | Freq: Four times a day (QID) | ORAL | Status: DC | PRN
  Administered 2017-12-08 – 2017-12-09 (×3): 650 mg via ORAL
  Filled 2017-12-08 (×3): qty 2

## 2017-12-08 MED ORDER — IOHEXOL 300 MG/ML  SOLN
125.0000 mL | Freq: Once | INTRAMUSCULAR | Status: AC | PRN
Start: 2017-12-08 — End: 2017-12-08
  Administered 2017-12-08: 125 mL via INTRAVENOUS

## 2017-12-08 MED ORDER — VITAMIN D 1000 UNITS PO TABS
2000.0000 [IU] | ORAL_TABLET | Freq: Every day | ORAL | Status: DC
  Administered 2017-12-08 – 2017-12-09 (×2): 2000 [IU] via ORAL
  Filled 2017-12-08 (×2): qty 2

## 2017-12-08 MED ORDER — DOCUSATE SODIUM 100 MG PO CAPS
100.0000 mg | ORAL_CAPSULE | Freq: Two times a day (BID) | ORAL | Status: DC
  Administered 2017-12-08 – 2017-12-09 (×2): 100 mg via ORAL
  Filled 2017-12-08 (×3): qty 1

## 2017-12-08 MED ORDER — ONDANSETRON HCL 4 MG PO TABS: 4.0000 mg | ORAL_TABLET | Freq: Four times a day (QID) | ORAL | Status: DC | PRN

## 2017-12-08 MED ORDER — ONDANSETRON HCL 4 MG/2ML IJ SOLN
4.0000 mg | Freq: Four times a day (QID) | INTRAMUSCULAR | Status: DC | PRN
  Administered 2017-12-08: 13:00:00 4 mg via INTRAVENOUS
  Filled 2017-12-08: qty 2

## 2017-12-08 MED ORDER — HEPARIN (PORCINE) IN NACL 100-0.45 UNIT/ML-% IJ SOLN
1200.0000 [IU]/h | INTRAMUSCULAR | Status: DC
  Administered 2017-12-08: 1600 [IU]/h via INTRAVENOUS
  Administered 2017-12-08: 1400 [IU]/h via INTRAVENOUS
  Filled 2017-12-08 (×2): qty 250

## 2017-12-08 MED ORDER — ACETAMINOPHEN 650 MG RE SUPP: 650.0000 mg | Freq: Four times a day (QID) | RECTAL | Status: DC | PRN

## 2017-12-08 MED ORDER — HEPARIN BOLUS VIA INFUSION
5000.0000 [IU] | Freq: Once | INTRAVENOUS | Status: AC
  Administered 2017-12-08: 08:00:00 5000 [IU] via INTRAVENOUS
  Filled 2017-12-08: qty 5000

## 2017-12-08 NOTE — Progress Notes (Addendum)
ANTICOAGULATION CONSULT NOTE - Initial Consult  Pharmacy Consult for heparin drip Indication: suspected arterial thrombus  Allergies  Allergen Reactions  . Augmentin [Amoxicillin-Pot Clavulanate] Hives and Itching    Has patient had a PCN reaction causing immediate rash, facial/tongue/throat swelling, SOB or lightheadedness with hypotension:No Has patient had a PCN reaction causing severe rash involving mucus membranes or skin necrosis:No Has patient had a PCN reaction that required hospitalization:No Has patient had a PCN reaction occurring within the last 10 years:No If all of the above answers are "NO", then may proceed with Cephalosporin use.   . Pneumococcal Vaccines Other (See Comments)    Caused fever, and swelling at injection site    Patient Measurements: Height: 5\' 7"  (170.2 cm) Weight: 279 lb 3.2 oz (126.6 kg) IBW/kg (Calculated) : 61.6 Heparin Dosing Weight: 91 kg  Vital Signs: Temp: 99.1 F (37.3 C) (04/19 1957) Temp Source: Oral (04/19 1957) BP: 145/61 (04/19 1957) Pulse Rate: 76 (04/19 1957)  Labs: Recent Labs    12/07/17 2147 12/08/17 0547 12/08/17 1354 12/08/17 1859  HGB 13.2  --   --   --   HCT 38.9  --   --   --   PLT 223  --   --   --   APTT  --  30  --   --   LABPROT  --  14.4  --   --   INR  --  1.13  --   --   HEPARINUNFRC  --   --  0.93* 0.89*  CREATININE 0.80  --   --   --   TROPONINI <0.03  --   --   --     Estimated Creatinine Clearance: 93.1 mL/min (by C-G formula based on SCr of 0.8 mg/dL).   Medical History: Past Medical History:  Diagnosis Date  . Arthritis   . CHF (congestive heart failure) (HCC)   . Essential hypertension 10/15/2015  . Heart murmur   . Hypertension   . Hypothyroidism 10/15/2015  . 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"    Medications:  No anticoagulation in PTA meds  Assessment:  Goal of Therapy:  Heparin level 0.3-0.7 units/ml Monitor platelets by anticoagulation protocol:  Yes   Plan:  5000 unit bolus and initial rate of 1600 units/hr. First heparin level 6 hours after start of infusion.  4/19@1550  HL 0.93, reduce dose of heparin to heparin iv 1400units./hr recheck in 6 hours at 2100  4/19 1900 heparin level 0.89. Decrease rate to 1200 units/hr. Recheck heparin level and CBC with tomorrow AM labs.  4/20 AM heparin level 0.55. Continue current regimen. Recheck in 6 hours to confirm.   Rasheeda Mulvehill S 12/08/2017,10:22 PM

## 2017-12-08 NOTE — ED Notes (Signed)
2nd Blue top tube sent to lab not acceptable. Lab to come draw sample

## 2017-12-08 NOTE — ED Notes (Signed)
Spoke to pharmacy. Will call them when Blue top resulted to start Heparin

## 2017-12-08 NOTE — ED Provider Notes (Signed)
[facility] Emergency Department Provider Note  ____________________________________________  Time seen: Approximately 3:08 AM  I have reviewed the triage vital signs and the nursing notes.   HISTORY  Chief Complaint Abdominal Pain    HPI [name] is a 67 y.o. female with a history of hypertension, CHF who complains of acute onset left upper quadrant and epigastric abdominal pain starting at about 3:00 PM today. Was constant and severe for 2 hours, now gradually improving.  No aggravating or alleviating factors. It radiates to her left mid back. No dysuria fevers chills or body aches.      Past Medical History:  Diagnosis Date  . Arthritis   . CHF (congestive heart failure) (HCC)   . Essential hypertension 10/15/2015  . Heart murmur   . Hypertension   . Hypothyroidism 10/15/2015  . 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"     Patient Active Problem List   Diagnosis Date Noted  . OA (osteoarthritis) of knee 04/04/2016  . Essential hypertension 10/15/2015  . Hypothyroidism 10/15/2015  . Incarcerated umbilical hernia 10/12/2015     Past Surgical History:  Procedure Laterality Date  . CHOLECYSTECTOMY    . DILATION AND CURETTAGE OF UTERUS    . OMENTECTOMY N/A 10/12/2015   Procedure: PARTIAL OMENTECTOMY;  Surgeon: Violeta Gelinas, MD;  Location: [facility] OR;  Service: General;  Laterality: N/A;  . TOTAL KNEE ARTHROPLASTY Left 04/04/2016   Procedure: LEFT TOTAL KNEE ARTHROPLASTY;  Surgeon: Ollen Gross, MD;  Location: [facility] ORS;  Service: Orthopedics;  Laterality: Left;  . TOTAL KNEE ARTHROPLASTY Right 08/01/2016   Procedure: TOTAL KNEE ARTHROPLASTY;  Surgeon: Ollen Gross, MD;  Location: [facility] ORS;  Service: Orthopedics;  Laterality: Right;  . UMBILICAL HERNIA REPAIR N/A 10/12/2015   Procedure: HERNIA REPAIR UMBILICAL ADULT/INCARERATED;  Surgeon: Violeta Gelinas, MD;  Location: [facility] OR;  Service: General;  Laterality: N/A;      Prior to Admission medications   Medication Sig Start Date End Date Taking? Authorizing Provider  acetaminophen (TYLENOL) 650 MG CR tablet Take 1,300 mg by mouth at bedtime.    [provider]  Calcium Carbonate (CALCIUM 600 PO) Take 1 tablet by mouth daily.    [provider]  furosemide (LASIX) 20 MG tablet Take 40 mg by mouth every morning.     [provider]  levothyroxine (SYNTHROID, LEVOTHROID) 75 MCG tablet Take 75 mcg by mouth daily before breakfast.  09/07/15   [provider]  metoprolol (LOPRESSOR) 100 MG tablet Take 100 mg by mouth 2 (two) times daily.  10/05/15   [provider]  Vitamin D, Cholecalciferol, 1000 units TABS Take 2,000 Units by mouth daily.    [provider]     Allergies Augmentin [amoxicillin-pot clavulanate] and Pneumococcal vaccines   Family History  Problem Relation Age of Onset  . Rheum arthritis Mother   . Heart attack Father   . Heart disease Father     Social History Social History   Tobacco Use  . Smoking status: Former Smoker    Last attempt to quit: 03/29/1987    Years since quitting: 30.7  . Smokeless tobacco: Never Used  Substance Use Topics  . Alcohol use: No  . Drug use: No    Review of Systems  Constitutional:   No fever or chills.  ENT:   No sore throat. No rhinorrhea. Cardiovascular:   No chest pain or syncope. Respiratory:   No dyspnea or cough. Gastrointestinal:  positive as above for abdominal pain without vomiting or diarrhea. Musculoskeletal:   Negative for focal pain or swelling All other systems reviewed and are negative except as documented above in ROS and HPI.  ____________________________________________   PHYSICAL EXAM:  VITAL SIGNS: ED Triage Vitals  Enc Vitals Group     BP 12/07/17 2146 (!) 145/73     Pulse Rate 12/07/17 2146 66     Resp 12/07/17 2146 15     Temp 12/07/17 2146 98.4 F (36.9 C)     Temp Source 12/07/17 2146 Oral     SpO2  12/07/17 2146 100 %     Weight 12/07/17 2148 275 lb (124.7 kg)     Height 12/07/17 2148 5\' 7"  (1.702 m)     Head Circumference --      Peak Flow --      Pain Score 12/07/17 2148 2     Pain Loc --      Pain Edu? --      Excl. in GC? --     Vital signs reviewed, nursing assessments reviewed.   Constitutional:   Alert and oriented. Well appearing and in no distress. Eyes:   Conjunctivae are normal. EOMI. PERRL. ENT      Head:   Normocephalic and atraumatic.      Nose:   No congestion/rhinnorhea.       Mouth/Throat:   MMM, no pharyngeal erythema. No peritonsillar mass.       Neck:   No meningismus. Full ROM. Hematological/Lymphatic/Immunilogical:   No cervical lymphadenopathy. Cardiovascular:   RRR. Symmetric bilateral radial and DP pulses.  No murmurs.  Respiratory:   Normal respiratory effort without tachypnea/retractions. Breath sounds are clear and equal bilaterally. No wheezes/rales/rhonchi. Gastrointestinal:   Soft with epigastric and left upper quadrant tenderness. Non distended. There is no CVA tenderness.  No rebound, rigidity, or guarding.  Musculoskeletal:   Normal range of motion in all extremities. No joint effusions.  No lower extremity tenderness.  No edema. Neurologic:   Normal speech and language.  Motor grossly intact. No acute focal neurologic deficits are appreciated.  Skin:    Skin is warm, dry and intact. No rash noted.  No petechiae, purpura, or bullae.  ____________________________________________    LABS (pertinent positives/negatives) (all labs ordered are listed, but only abnormal results are displayed) Labs Reviewed  COMPREHENSIVE METABOLIC PANEL - Abnormal; Notable for the following components:      Result Value   Glucose, Bld 104 (*)    BUN 39 (*)    Calcium 8.8 (*)    All other components within normal limits  CBC - Abnormal; Notable for the following components:   WBC 11.2 (*)    All other components within normal limits  URINALYSIS,  COMPLETE (UACMP) WITH MICROSCOPIC - Abnormal; Notable for the following components:   Color, Urine YELLOW (*)    APPearance CLEAR (*)    Leukocytes, UA TRACE (*)    Bacteria, UA RARE (*)    Squamous Epithelial / LPF 0-5 (*)    All other components within normal limits  LIPASE, BLOOD  TROPONIN I   ____________________________________________   EKG  interpreted by me Normal sinus rhythm rate of 64, normal axis and intervals. Normal QRS ST segments and T waves.  ____________________________________________    RADIOLOGY  Ct Abdomen Pelvis W Contrast  Result Date: 12/08/2017 CLINICAL DATA:  Mid upper abdominal pain, nausea and diarrhea starting at 1630 hours. EXAM: CT ABDOMEN AND PELVIS WITH CONTRAST TECHNIQUE: Multidetector  CT imaging of the abdomen and pelvis was performed using the standard protocol following bolus administration of intravenous contrast. CONTRAST:  125mL OMNIPAQUE IOHEXOL 300 MG/ML  SOLN COMPARISON:  CT report 05/05/2002 FINDINGS: Lower chest: Top-normal size heart without pericardial effusion. There is coronary arteriosclerosis. Minimal bibasilar atelectasis. Hepatobiliary: No focal liver abnormality is seen. Status post cholecystectomy. No biliary dilatation. Pancreas: Unremarkable. No pancreatic ductal dilatation or surrounding inflammatory changes. Spleen: Hypodense appearance of the medial upper pole of the spleen suspicious for splenic infarct. No subcapsular fluid to suggest a laceration. No splenomegaly. No splenic mass. Adrenals/Urinary Tract: Normal bilateral adrenal glands. Tiny too small to characterize hypodensities in the lower pole the left kidney likely to represent small cysts. No nephrolithiasis nor obstructive uropathy. The urinary bladder is unremarkable. Stomach/Bowel: Small hiatal hernia. Contracted stomach. No small bowel dilatation or obstruction. No inflammation. Normal appendix. Moderate stool burden within the colon. There is colonic diverticulosis  along the descending sigmoid colon without acute diverticulitis. Vascular/Lymphatic: Mild aortoiliac atherosclerosis without aneurysm or dissection. No significant pleural plaquing. Patent splenic artery without aneurysm. Patent portal and splenic veins. Reproductive: Uterus and bilateral adnexa are unremarkable. Other: No abdominal wall hernia or abnormality. No abdominopelvic ascites. Musculoskeletal: T10 through L2 degenerative disc disease with disc flattening. Disc flattening also noted at L5-S1. No suspicious osseous lesions nor fracture. 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 IMPRESSION: 1. Segmental area of hypoattenuation involving the upper pole of the spleen consistent with a splenic infarct. No subcapsular fluid to suggest splenic laceration. Given linear appearance, splenic abscess or cyst is not likely. Etiologies could be hematologic or possibly embolic such as from atrial fibrillation. 2. Colonic diverticulosis without acute diverticulitis. 3. Lumbar spondylosis. 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 Electronically Signed   By: Tollie Ethavid  Kwon M.D.   On: 12/08/2017 02:04    ____________________________________________   PROCEDURES Procedures  ____________________________________________  DIFFERENTIAL DIAGNOSIS   pancreatitis, bowel perforation, internal hernia, kidney stone  CLINICAL IMPRESSION / ASSESSMENT AND PLAN / ED COURSE  Pertinent labs & imaging results that were available during my care of the patient were reviewed by me and considered in my medical decision making (see chart for details).      Clinical Course as of Dec 09 306  Fri Dec 08, 2017  0022 Epigastric and L flank pain. Most likely kidney stone. Will get CT a/p to further eval given PMHx and age and vague syndrome. Doubt ACS, PE, dissection, AAA.   [PS]    Clinical Course User Index [PS] Sharman CheekStafford, Catrinia Racicot, MD     ----------------------------------------- 3:14 AM on 12/08/2017 -----------------------------------------  CT scan reveals splenic infarct, likely embolic. with recent Holter monitor result in electronic medical record showing that she has had paroxysmal atrial tachycardia, concern for cardiac source which would put patient at high risk of further embolic injuries such as stroke. Discussed these results with patient, she is amenable to hospitalization for further workup. IV heparin for now. Symptoms overall are actually improving but still has some residual left upper quadrant abdominal pain. Still no reason to suspect that she would have ACS PE dissection. No evidence of AAA on imaging.   ____________________________________________   FINAL CLINICAL IMPRESSION(S) / ED DIAGNOSES    Final diagnoses:  Splenic infarct  Left upper quadrant pain     ED Discharge Orders    None      Portions of this note were generated with dragon dictation software. Dictation errors  may occur despite best attempts at proofreading.    Sharman Cheek, MD 12/08/17 [phone]

## 2017-12-08 NOTE — Progress Notes (Signed)
ANTICOAGULATION CONSULT NOTE - Initial Consult  Pharmacy Consult for heparin drip Indication: suspected arterial thrombus  Allergies  Allergen Reactions  . Augmentin [Amoxicillin-Pot Clavulanate] Hives and Itching    Has patient had a PCN reaction causing immediate rash, facial/tongue/throat swelling, SOB or lightheadedness with hypotension:No Has patient had a PCN reaction causing severe rash involving mucus membranes or skin necrosis:No Has patient had a PCN reaction that required hospitalization:No Has patient had a PCN reaction occurring within the last 10 years:No If all of the above answers are "NO", then may proceed with Cephalosporin use.   . Pneumococcal Vaccines Other (See Comments)    Caused fever, and swelling at injection site    Patient Measurements: Height: 5\' 7"  (170.2 cm) Weight: 275 lb (124.7 kg) IBW/kg (Calculated) : 61.6 Heparin Dosing Weight: 91 kg  Vital Signs: Temp: 98.4 F (36.9 C) (04/18 2146) Temp Source: Oral (04/18 2146) BP: 150/73 (04/19 0100) Pulse Rate: 55 (04/19 0100)  Labs: Recent Labs    12/07/17 2147  HGB 13.2  HCT 38.9  PLT 223  CREATININE 0.80  TROPONINI <0.03    Estimated Creatinine Clearance: 92.2 mL/min (by C-G formula based on SCr of 0.8 mg/dL).   Medical History: Past Medical History:  Diagnosis Date  . Arthritis   . CHF (congestive heart failure) (HCC)   . Essential hypertension 10/15/2015  . Heart murmur   . Hypertension   . Hypothyroidism 10/15/2015  . 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"    Medications:  No anticoagulation in PTA meds  Assessment:  Goal of Therapy:  Heparin level 0.3-0.7 units/ml Monitor platelets by anticoagulation protocol: Yes   Plan:  5000 unit bolus and initial rate of 1600 units/hr. First heparin level 6 hours after start of infusion.  Ausha Sieh S 12/08/2017,4:00 AM

## 2017-12-08 NOTE — ED Notes (Signed)
Lab here blood draw completed

## 2017-12-08 NOTE — Progress Notes (Signed)
ANTICOAGULATION CONSULT NOTE - Initial Consult  Pharmacy Consult for heparin drip Indication: suspected arterial thrombus  Allergies  Allergen Reactions  . Augmentin [Amoxicillin-Pot Clavulanate] Hives and Itching    Has patient had a PCN reaction causing immediate rash, facial/tongue/throat swelling, SOB or lightheadedness with hypotension:No Has patient had a PCN reaction causing severe rash involving mucus membranes or skin necrosis:No Has patient had a PCN reaction that required hospitalization:No Has patient had a PCN reaction occurring within the last 10 years:No If all of the above answers are "NO", then may proceed with Cephalosporin use.   . Pneumococcal Vaccines Other (See Comments)    Caused fever, and swelling at injection site    Patient Measurements: Height: 5\' 7"  (170.2 cm) Weight: 279 lb 3.2 oz (126.6 kg) IBW/kg (Calculated) : 61.6 Heparin Dosing Weight: 91 kg  Vital Signs: Temp: 98.9 F (37.2 C) (04/19 1236) Temp Source: Oral (04/19 1236) BP: 139/75 (04/19 1236) Pulse Rate: 57 (04/19 1236)  Labs: Recent Labs    12/07/17 2147 12/08/17 0547 12/08/17 1354  HGB 13.2  --   --   HCT 38.9  --   --   PLT 223  --   --   APTT  --  30  --   LABPROT  --  14.4  --   INR  --  1.13  --   HEPARINUNFRC  --   --  0.93*  CREATININE 0.80  --   --   TROPONINI <0.03  --   --     Estimated Creatinine Clearance: 93.1 mL/min (by C-G formula based on SCr of 0.8 mg/dL).   Medical History: Past Medical History:  Diagnosis Date  . Arthritis   . CHF (congestive heart failure) (HCC)   . Essential hypertension 10/15/2015  . Heart murmur   . Hypertension   . Hypothyroidism 10/15/2015  . 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"    Medications:  No anticoagulation in PTA meds  Assessment:  Goal of Therapy:  Heparin level 0.3-0.7 units/ml Monitor platelets by anticoagulation protocol: Yes   Plan:  5000 unit bolus and initial rate of 1600  units/hr. First heparin level 6 hours after start of infusion.  4/19@1550  HL 0.93, reduce dose of heparin to heparin iv 1400units./hr recheck in 6 hours at 2100   [name] 12/08/2017,2:50 PM

## 2017-12-08 NOTE — Care Management Note (Signed)
Case Management Note  Patient Details  Name: Christine SleetGwendolyn [name] MRN: [id number] Date of Birth: 09/14/1950  Subjective/Objective:  Admitted to [facility] with the diagnosis of splenic infarct under observation status. One of her sons is in the home. Other son is Arlys JohnBrian [phone]). Prescriptions are filled t [facility] in [place]. No home Health. No skilled facility. No home oxygen.  Raised toilet seat, grab bars, cane, and wheelchair in the home. Takes care of all basic activities of daily living himself, drives.  Last fall was May 2018. Good appetite.    Family will transport.             Action/Plan: Will continue to follow for plans.    Expected Discharge Date:  12/08/17               Expected Discharge Plan:     In-House Referral:     Discharge planning Services     Post Acute Care Choice:    Choice offered to:     DME Arranged:    DME Agency:     HH Arranged:    HH Agency:     Status of Service:     If discussed at [employer] Meetings, dates discussed:    Additional Comments:  Gwenette GreetBrenda S Salene Mohamud, RN MSN CCM Care Management [phone] 12/08/2017, 9:24 AM

## 2017-12-08 NOTE — Progress Notes (Signed)
*  PRELIMINARY RESULTS* Echocardiogram 2D Echocardiogram has been performed.  Joanette GulaJoan M Seneca Hoback 12/08/2017, 10:03 AM

## 2017-12-08 NOTE — ED Notes (Signed)
Call from lab, need to redraw blue tube

## 2017-12-08 NOTE — H&P (Signed)
[name] is an 68 y.o. female.   Chief Complaint: Abdominal pain HPI: The patient with past medical history of hypertension, CHF, paroxysmal atrial fibrillation and hypothyroidism presents to the emergency department complaining of severe abdominal pain.  Patient states that her pain is mid epigastric to left upper quadrant and radiates through to her back.  The pain was constant but is now gradually improving.  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  Due to ongoing pain and potential embolization causing infarction to the spleen the emergency department staff, hospitalist service for further evaluation.  Past Medical History:  Diagnosis Date  . Arthritis   . CHF (congestive heart failure) ([place])   . Essential hypertension 10/15/2015  . Heart murmur   . Hypertension   . Hypothyroidism 10/15/2015  . 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"    Past Surgical History:  Procedure Laterality Date  . CHOLECYSTECTOMY    . DILATION AND CURETTAGE OF UTERUS    . OMENTECTOMY N/A 10/12/2015   Procedure: PARTIAL OMENTECTOMY;  Surgeon: Georganna Skeans, MD;  Location: [facility];  Service: General;  Laterality: N/A;  . TOTAL KNEE ARTHROPLASTY Left 04/04/2016   Procedure: LEFT TOTAL KNEE ARTHROPLASTY;  Surgeon: Gaynelle Arabian, MD;  Location: [facility];  Service: Orthopedics;  Laterality: Left;  . TOTAL KNEE ARTHROPLASTY Right 08/01/2016   Procedure: TOTAL KNEE ARTHROPLASTY;  Surgeon: Gaynelle Arabian, MD;  Location: [facility];  Service: Orthopedics;  Laterality: Right;  . UMBILICAL HERNIA REPAIR N/A 10/12/2015   Procedure: HERNIA REPAIR UMBILICAL ADULT/INCARERATED;  Surgeon: Georganna Skeans, MD;  Location: [facility];  Service: General;  Laterality: N/A;    Family History  Problem Relation Age of Onset  . Rheum arthritis Mother   . Heart attack Father   . Heart disease Father    Social History:  reports that she quit smoking about 30 years ago. She has never used smokeless  tobacco. She reports that she does not drink alcohol or use drugs.  Allergies:  Allergies  Allergen Reactions  . Augmentin [Amoxicillin-Pot Clavulanate] Hives and Itching    Has patient had a PCN reaction causing immediate rash, facial/tongue/throat swelling, SOB or lightheadedness with hypotension:No Has patient had a PCN reaction causing severe rash involving mucus membranes or skin necrosis:No Has patient had a PCN reaction that required hospitalization:No Has patient had a PCN reaction occurring within the last 10 years:No If all of the above answers are "NO", then may proceed with Cephalosporin use.   . Pneumococcal Vaccines Other (See Comments)    Caused fever, and swelling at injection site    Medications Prior to Admission  Medication Sig Dispense Refill  . acetaminophen (TYLENOL) 650 MG CR tablet Take 1,300 mg by mouth at bedtime.    . Calcium Carbonate (CALCIUM 600 PO) Take 1 tablet by mouth daily.    . furosemide (LASIX) 20 MG tablet Take 40 mg by mouth every morning.     Marland Kitchen levothyroxine (SYNTHROID, LEVOTHROID) 75 MCG tablet Take 75 mcg by mouth daily before breakfast.   2  . metoprolol (LOPRESSOR) 100 MG tablet Take 100 mg by mouth 2 (two) times daily.     . Vitamin D, Cholecalciferol, 1000 units TABS Take 2,000 Units by mouth daily.      Results for orders placed or performed during the hospital encounter of 12/07/17 (from the past 48 hour(s))  Lipase, blood     Status: None   Collection Time: 12/07/17  9:47 PM  Result Value Ref Range   Lipase 40 11 - 51 U/L    Comment: Performed at [facility], [address] [place], [place] [address]  Comprehensive metabolic panel     Status: Abnormal   Collection Time: 12/07/17  9:47 PM  Result Value Ref Range   Sodium 139 135 - 145 mmol/L   Potassium 3.8 3.5 - 5.1 mmol/L   Chloride 103 101 - 111 mmol/L   CO2 29 22 - 32 mmol/L   Glucose, Bld 104 (H) 65 - 99 mg/dL   BUN 39 (H) 6 - 20 mg/dL   Creatinine, Ser 0.80  0.44 - 1.00 mg/dL   Calcium 8.8 (L) 8.9 - 10.3 mg/dL   Total Protein 7.6 6.5 - 8.1 g/dL   Albumin 3.9 3.5 - 5.0 g/dL   AST 36 15 - 41 U/L   ALT 18 14 - 54 U/L   Alkaline Phosphatase 114 38 - 126 U/L   Total Bilirubin 0.5 0.3 - 1.2 mg/dL   GFR calc non Af Amer >60 >60 mL/min   GFR calc Af Amer >60 >60 mL/min    Comment: (NOTE) The eGFR has been calculated using the CKD EPI equation. This calculation has not been validated in all clinical situations. eGFR's persistently <60 mL/min signify possible Chronic Kidney Disease.    Anion gap 7 5 - 15    Comment: Performed at [facility], [address] [place], [place] [address]  CBC     Status: Abnormal   Collection Time: 12/07/17  9:47 PM  Result Value Ref Range   WBC 11.2 (H) 3.6 - 11.0 K/uL   RBC 4.41 3.80 - 5.20 MIL/uL   Hemoglobin 13.2 12.0 - 16.0 g/dL   HCT 38.9 35.0 - 47.0 %   MCV 88.2 80.0 - 100.0 fL   MCH 29.9 26.0 - 34.0 pg   MCHC 33.9 32.0 - 36.0 g/dL   RDW 14.4 11.5 - 14.5 %   Platelets 223 150 - 440 K/uL    Comment: Performed at [facility], [address] [place], [place] [address]  Troponin I     Status: None   Collection Time: 12/07/17  9:47 PM  Result Value Ref Range   Troponin I <0.03 <0.03 ng/mL    Comment: Performed at [facility], [address] [place], [place] [address]  Urinalysis, Complete w Microscopic     Status: Abnormal   Collection Time: 12/07/17  9:49 PM  Result Value Ref Range   Color, Urine YELLOW (A) YELLOW   APPearance CLEAR (A) CLEAR   Specific Gravity, Urine 1.012 1.005 - 1.030   pH 6.0 5.0 - 8.0   Glucose, UA NEGATIVE NEGATIVE mg/dL   Hgb urine dipstick NEGATIVE NEGATIVE   Bilirubin Urine NEGATIVE NEGATIVE   Ketones, ur NEGATIVE NEGATIVE mg/dL   Protein, ur NEGATIVE NEGATIVE mg/dL   Nitrite NEGATIVE NEGATIVE   Leukocytes, UA TRACE (A) NEGATIVE   RBC / HPF 0-5 0 - 5 RBC/hpf   WBC, UA 0-5 0 - 5 WBC/hpf   Bacteria, UA RARE (A) NONE SEEN   Squamous  Epithelial / LPF 0-5 (A) NONE SEEN   Mucus PRESENT     Comment: Performed at [facility], [address] [place],  [address]  Protime-INR     Status: None   Collection Time: 12/08/17  5:47 AM  Result Value Ref Range   Prothrombin Time 14.4 11.4 - 15.2 seconds   INR 1.13  Comment: Performed at [facility], [address] [place], [place] [address]  APTT     Status: None   Collection Time: 12/08/17  5:47 AM  Result Value Ref Range   aPTT 30 24 - 36 seconds    Comment: Performed at [facility], [address] [place], [place] [address]   Ct Abdomen Pelvis W Contrast  Result Date: 12/08/2017 CLINICAL DATA:  Mid upper abdominal pain, nausea and diarrhea starting at 1630 hours. EXAM: CT ABDOMEN AND PELVIS WITH CONTRAST TECHNIQUE: Multidetector CT imaging of the abdomen and pelvis was performed using the standard protocol following bolus administration of intravenous contrast. CONTRAST:  161m OMNIPAQUE IOHEXOL 300 MG/ML  SOLN COMPARISON:  CT report 05/05/2002 FINDINGS: Lower chest: Top-normal size heart without pericardial effusion. There is coronary arteriosclerosis. Minimal bibasilar atelectasis. Hepatobiliary: No focal liver abnormality is seen. Status post cholecystectomy. No biliary dilatation. Pancreas: Unremarkable. No pancreatic ductal dilatation or surrounding inflammatory changes. Spleen: Hypodense appearance of the medial upper pole of the spleen suspicious for splenic infarct. No subcapsular fluid to suggest a laceration. No splenomegaly. No splenic mass. Adrenals/Urinary Tract: Normal bilateral adrenal glands. Tiny too small to characterize hypodensities in the lower pole the left kidney likely to represent small cysts. No nephrolithiasis nor obstructive uropathy. The urinary bladder is unremarkable. Stomach/Bowel: Small hiatal hernia. Contracted stomach. No small bowel dilatation or obstruction. No inflammation. Normal appendix. Moderate stool  burden within the colon. There is colonic diverticulosis along the descending sigmoid colon without acute diverticulitis. Vascular/Lymphatic: Mild aortoiliac atherosclerosis without aneurysm or dissection. No significant pleural plaquing. Patent splenic artery without aneurysm. Patent portal and splenic veins. Reproductive: Uterus and bilateral adnexa are unremarkable. Other: No abdominal wall hernia or abnormality. No abdominopelvic ascites. Musculoskeletal: T10 through L2 degenerative disc disease with disc flattening. Disc flattening also noted at L5-S1. No suspicious osseous lesions nor fracture. 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 IMPRESSION: 1. Segmental area of hypoattenuation involving the upper pole of the spleen consistent with a splenic infarct. No subcapsular fluid to suggest splenic laceration. Given linear appearance, splenic abscess or cyst is not likely. Etiologies could be hematologic or possibly embolic such as from atrial fibrillation. 2. Colonic diverticulosis without acute diverticulitis. 3. Lumbar spondylosis. 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 Electronically Signed   By: DAshley RoyaltyM.D.   On: 12/08/2017 02:04    Review of Systems  Constitutional: Negative for chills and fever.  HENT: Negative for sore throat and tinnitus.   Eyes: Negative for blurred vision and redness.  Respiratory: Negative for cough and shortness of breath.   Cardiovascular: Negative for chest pain, palpitations, orthopnea and PND.  Gastrointestinal: Positive for abdominal pain. Negative for diarrhea, nausea and vomiting.  Genitourinary: Negative for dysuria, frequency and urgency.  Musculoskeletal: Negative for joint pain and myalgias.  Skin: Negative for rash.       No lesions  Neurological: Negative for speech change, focal weakness and weakness.  Endo/Heme/Allergies: Does not bruise/bleed easily.        No temperature intolerance  Psychiatric/Behavioral: Negative for depression and suicidal ideas.    Blood pressure (!) 157/84, pulse (!) 55, temperature 97.9 F (36.6 C), temperature source Oral, resp. rate (!) 22, height 5' 7"  (1.702 m), weight 126.6 kg (279 lb 3.2 oz), SpO2 100 %. Physical Exam  Vitals reviewed. Constitutional: She is oriented to person, place, and time. She appears well-developed and well-nourished. No distress.  HENT:  Head: Normocephalic and atraumatic.  Mouth/Throat: Oropharynx is clear and moist.  Eyes: Pupils are equal, round, and reactive to light. Conjunctivae and EOM are normal. No scleral icterus.  Neck: Normal range of motion. Neck supple. No JVD present. No tracheal deviation present. No thyromegaly present.  Cardiovascular: Normal rate, regular rhythm and normal heart sounds. Exam reveals no gallop and no friction rub.  No murmur heard. Respiratory: Effort normal and breath sounds normal.  GI: Soft. Bowel sounds are normal. She exhibits no distension and no mass. There is tenderness. There is guarding. There is no rebound.  Genitourinary:  Genitourinary Comments: Deferred  Musculoskeletal: Normal range of motion. She exhibits no edema.  Lymphadenopathy:    She has no cervical adenopathy.  Neurological: She is alert and oriented to person, place, and time. No cranial nerve deficit. She exhibits normal muscle tone.  Skin: Skin is warm and dry. No rash noted. No erythema.  Psychiatric: She has a normal mood and affect. Her behavior is normal. Judgment and thought content normal.     Assessment/Plan This is a 67 year old female admitted for splenic infarction. 1.  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  No atrial relation on current EKG.  Heparin drip started.  Consult vascular surgery.  Obtain echocardiogram to rule out thrombus. 2.  Essential hypertension: Uncontrolled; continue metoprolol.  IV hydralazine as  needed. 3.  Hypothyroidism: Check TSH; continue Synthroid 4.  CHF: Diastolic; chronic.  Continue furosemide. 5.  VT prophylaxis: Full dose anticoagulation 6.  GI prophylaxis: None The patient is a full code.  Time spent on admission orders and patient care approximately 45 minutes  Harrie Foreman, MD 12/08/2017, 6:20 AM

## 2017-12-08 NOTE — Care Management Obs Status (Signed)
MEDICARE OBSERVATION STATUS NOTIFICATION   Patient Details  Name: [name] MRN: [id number] Date of Birth: 12/12/1950   Medicare Observation Status Notification Given:  Yes    Gwenette GreetBrenda S Kabao Leite, RN 12/08/2017, 9:31 AM

## 2017-12-08 NOTE — Consult Note (Signed)
[facility] Vascular Consult Note  MRN : [id number]  [name] is a 67 y.o. (December 13, 1950) female who presents with chief complaint of  Chief Complaint  Patient presents with  . Abdominal Pain  .  History of Present Illness:   I have asked to evaluate the patient by Dr. Sheryle Hail.  Patient is admitted to [facility] with abdominal pain.  Work-up has demonstrated a moderate sized splenic infarct in association with paroxysmal atrial fibrillation.  She does note some nausea but no vomiting.  Trying to eat makes the symptoms worse.  She denies fever chills.  She does note that her abdominal pain is getting better.  Patient denies leg pain.  She does note she has significant vein problems.  She has tried compression in the past but it does not work.  Current Facility-Administered Medications  Medication Dose Route Frequency Provider Last Rate Last Dose  . acetaminophen (TYLENOL) tablet 650 mg  650 mg Oral Q6H PRN Arnaldo Natal, MD   650 mg at 12/08/17 1607   Or  . acetaminophen (TYLENOL) suppository 650 mg  650 mg Rectal Q6H PRN Arnaldo Natal, MD      . calcium carbonate (TUMS - dosed in mg elemental calcium) chewable tablet 200 mg of elemental calcium  1 tablet Oral BID WC Arnaldo Natal, MD   200 mg of elemental calcium at 12/08/17 1607  . cholecalciferol (VITAMIN D) tablet 2,000 Units  2,000 Units Oral Daily Arnaldo Natal, MD   2,000 Units at 12/08/17 1045  . docusate sodium (COLACE) capsule 100 mg  100 mg Oral BID Arnaldo Natal, MD   100 mg at 12/08/17 1045  . furosemide (LASIX) tablet 40 mg  40 mg Oral q morning - 10a Arnaldo Natal, MD   40 mg at 12/08/17 1045  . heparin ADULT infusion 100 units/mL (25000 units/268mL sodium chloride 0.45%)  1,400 Units/hr Intravenous Continuous Coffee, Gerre Pebbles, RPH 14 mL/hr at 12/08/17 1546 1,400 Units/hr at 12/08/17 1546  . levothyroxine (SYNTHROID, LEVOTHROID) tablet 75 mcg   75 mcg Oral QAC breakfast Arnaldo Natal, MD   75 mcg at 12/08/17 7829  . metoprolol tartrate (LOPRESSOR) tablet 100 mg  100 mg Oral BID Arnaldo Natal, MD   100 mg at 12/08/17 1045  . ondansetron (ZOFRAN) tablet 4 mg  4 mg Oral Q6H PRN Arnaldo Natal, MD       Or  . ondansetron [facility]) injection 4 mg  4 mg Intravenous Q6H PRN Arnaldo Natal, MD   4 mg at 12/08/17 1241    Past Medical History:  Diagnosis Date  . Arthritis   . CHF (congestive heart failure) (HCC)   . Essential hypertension 10/15/2015  . Heart murmur   . Hypertension   . Hypothyroidism 10/15/2015  . 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"    Past Surgical History:  Procedure Laterality Date  . CHOLECYSTECTOMY    . DILATION AND CURETTAGE OF UTERUS    . OMENTECTOMY N/A 10/12/2015   Procedure: PARTIAL OMENTECTOMY;  Surgeon: Violeta Gelinas, MD;  Location: [facility] OR;  Service: General;  Laterality: N/A;  . TOTAL KNEE ARTHROPLASTY Left 04/04/2016   Procedure: LEFT TOTAL KNEE ARTHROPLASTY;  Surgeon: Ollen Gross, MD;  Location: [facility] ORS;  Service: Orthopedics;  Laterality: Left;  . TOTAL KNEE ARTHROPLASTY Right 08/01/2016   Procedure: TOTAL KNEE ARTHROPLASTY;  Surgeon: Ollen Gross, MD;  Location: [facility] ORS;  Service: Orthopedics;  Laterality: Right;  . UMBILICAL HERNIA REPAIR N/A 10/12/2015   Procedure: HERNIA REPAIR UMBILICAL ADULT/INCARERATED;  Surgeon: Violeta Gelinas, MD;  Location: [facility] OR;  Service: General;  Laterality: N/A;    Social History Social History   Tobacco Use  . Smoking status: Former Smoker    Last attempt to quit: 03/29/1987    Years since quitting: 30.7  . Smokeless tobacco: Never Used  Substance Use Topics  . Alcohol use: No  . Drug use: No    Family History Family History  Problem Relation Age of Onset  . Rheum arthritis Mother   . Heart attack Father   . Heart disease Father   No family history of bleeding/clotting disorders, porphyria or autoimmune  disease   Allergies  Allergen Reactions  . Augmentin [Amoxicillin-Pot Clavulanate] Hives and Itching    Has patient had a PCN reaction causing immediate rash, facial/tongue/throat swelling, SOB or lightheadedness with hypotension:No Has patient had a PCN reaction causing severe rash involving mucus membranes or skin necrosis:No Has patient had a PCN reaction that required hospitalization:No Has patient had a PCN reaction occurring within the last 10 years:No If all of the above answers are "NO", then may proceed with Cephalosporin use.   . Pneumococcal Vaccines Other (See Comments)    Caused fever, and swelling at injection site     REVIEW OF SYSTEMS (Negative unless checked)  Constitutional: [] Weight loss  [] Fever  [] Chills Cardiac: [] Chest pain   [] Chest pressure   [] Palpitations   [] Shortness of breath when laying flat   [] Shortness of breath at rest   [] Shortness of breath with exertion. Vascular:  [] Pain in legs with walking   [] Pain in legs at rest   [] Pain in legs when laying flat   [] Claudication   [] Pain in feet when walking  [] Pain in feet at rest  [] Pain in feet when laying flat   [] History of DVT   [] Phlebitis   [x] Swelling in legs   [x] Varicose veins   [] Non-healing ulcers Pulmonary:   [] Uses home oxygen   [] Productive cough   [] Hemoptysis   [] Wheeze  [] COPD   [] Asthma Neurologic:  [] Dizziness  [] Blackouts   [] Seizures   [] History of stroke   [] History of TIA  [] Aphasia   [] Temporary blindness   [] Dysphagia   [] Weakness or numbness in arms   [] Weakness or numbness in legs Musculoskeletal:  [] Arthritis   [] Joint swelling   [] Joint pain   [] Low back pain Hematologic:  [] Easy bruising  [] Easy bleeding   [] Hypercoagulable state   [] Anemic  [] Hepatitis Gastrointestinal:  [] Blood in stool   [] Vomiting blood  [] Gastroesophageal reflux/heartburn   [] Difficulty swallowing. Genitourinary:  [] Chronic kidney disease   [] Difficult urination  [] Frequent urination  [] Burning with urination    [] Blood in urine Skin:  [x] Rashes   [] Ulcers   [] Wounds Psychological:  [] History of anxiety   []  History of major depression.    Physical Examination  Vitals:   12/08/17 0500 12/08/17 0530 12/08/17 0611 12/08/17 1236  BP: (!) 145/74 (!) 146/74 (!) 157/84 139/75  Pulse: (!) 52 (!) 58 (!) 55 (!) 57  Resp:   (!) 22 16  Temp:   97.9 F (36.6 C) 98.9 F (37.2 C)  TempSrc:   Oral Oral  SpO2: 99% 99% 100% 98%  Weight:   279 lb 3.2 oz (126.6 kg)   Height:   5\' 7"  (1.702 m)    Body mass index is 43.73 kg/m.  Head: [place]/AT, No temporalis wasting.  Prominent temp pulse not noted. Ear/Nose/Throat: Nares w/o erythema or drainage, oropharynx w/o obsrtuction,  Eyes: PERRLA, Sclera nonicteric.  Neck: Supple, no nuchal rigidity.  No bruit or JVD.  Pulmonary:  Breath sounds equal bilaterally, no use of accessory muscles.  Cardiac: RRR, normal S1, S2, no Murmurs, rubs or gallops. Vascular: 1+ dorsalis pedis pulses bilaterally.  There are multiple varicose veins noted bilaterally with severe venous stasis dermatitis bilaterally.  There are no open sores or wounds at this time.  There is no evidence of embolic changes to the forefoot or toes. Gastrointestinal: soft, mild tender, non-distended.  Musculoskeletal: Moves all extremities.  No deformity or atrophy. No edema. Neurologic: CN 2-12 intact. Symmetrical.  Speech is fluent.  Psychiatric: Judgment intact, Mood & affect appropriate for pt's clinical situation. Dermatologic: No rashes or ulcers noted.  No cellulitis or open wounds. Lymph : No Cervical,  or Inguinal lymphadenopathy.      CBC Lab Results  Component Value Date   WBC 11.2 (H) 12/07/2017   HGB 13.2 12/07/2017   HCT 38.9 12/07/2017   MCV 88.2 12/07/2017   PLT 223 12/07/2017    BMET    Component Value Date/Time   NA 139 12/07/2017 2147   K 3.8 12/07/2017 2147   CL 103 12/07/2017 2147   CO2 29 12/07/2017 2147   GLUCOSE 104 (H) 12/07/2017 2147   BUN 39 (H) 12/07/2017 2147    CREATININE 0.80 12/07/2017 2147   CALCIUM 8.8 (L) 12/07/2017 2147   GFRNONAA >60 12/07/2017 2147   GFRAA >60 12/07/2017 2147   Estimated Creatinine Clearance: 93.1 mL/min (by C-G formula based on SCr of 0.8 mg/dL).  COAG Lab Results  Component Value Date   INR 1.13 12/08/2017   INR 1.05 07/27/2016   INR 1.09 03/28/2016   PROTIME 13.2 06/02/2010    Radiology I have personally reviewed the CT scan.  Moderate sized splenic infarct.  No evidence of hepatic renal infarcts.  The SMA is and celiac artery are easily traced to their quaternary branches and fill with contrast.  There is no evidence of embolic lesions in the iliacs or femorals.   Assessment/Plan 1.  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  There is no indication for surgical intervention at this time.  There does not appear to be any other lesions noted.  I would continue conservative care.  Further management per cardiology for atrial fibrillation.  2.  Venous insufficiency: No surgery or intervention at this point in time.    I have had a long discussion with the patient regarding venous insufficiency and why it  causes symptoms. I have discussed with the patient the chronic skin changes that accompany venous insufficiency and the long term sequela such as infection and ulceration.  Patient will begin wearing graduated compression stockings class 1 (20-30 mmHg) or compression wraps on a daily basis a prescription was given. The patient will put the stockings on first thing in the morning and removing them in the evening. The patient is instructed specifically not to sleep in the stockings.    In addition, behavioral modification including several periods of elevation of the lower extremities during the day will be continued. I have demonstrated that proper elevation is a position with the ankles at heart level.  The patient is instructed to begin routine exercise, especially  walking on a daily basis  Patient should undergo duplex ultrasound of the venous system to ensure that DVT or reflux is not  present.  This can be performed when she follows up in the office.  Following the review of the ultrasound the patient will follow up in 2-3 months to reassess the degree of swelling and the control that graduated compression stockings or compression wraps  is offering.   The patient can be assessed for a Lymph Pump at that time   Levora Dredge, MD  12/08/2017 6:45 PM

## 2017-12-09 ENCOUNTER — Encounter: Payer: Self-pay | Admitting: Internal Medicine

## 2017-12-09 DIAGNOSIS — I5032 Chronic diastolic (congestive) heart failure: Secondary | ICD-10-CM | POA: Diagnosis not present

## 2017-12-09 DIAGNOSIS — E039 Hypothyroidism, unspecified: Secondary | ICD-10-CM | POA: Diagnosis not present

## 2017-12-09 DIAGNOSIS — R002 Palpitations: Secondary | ICD-10-CM | POA: Diagnosis not present

## 2017-12-09 DIAGNOSIS — D735 Infarction of spleen: Secondary | ICD-10-CM

## 2017-12-09 DIAGNOSIS — I10 Essential (primary) hypertension: Secondary | ICD-10-CM | POA: Diagnosis not present

## 2017-12-09 LAB — CBC
HCT: 36.5 % (ref 35.0–47.0)
Hemoglobin: 12.7 g/dL (ref 12.0–16.0)
MCH: 30.7 pg (ref 26.0–34.0)
MCHC: 34.7 g/dL (ref 32.0–36.0)
MCV: 88.6 fL (ref 80.0–100.0)
PLATELETS: 149 10*3/uL — AB (ref 150–440)
RBC: 4.12 MIL/uL (ref 3.80–5.20)
RDW: 14.6 % — ABNORMAL HIGH (ref 11.5–14.5)
WBC: 10.9 10*3/uL (ref 3.6–11.0)

## 2017-12-09 LAB — ANTITHROMBIN III: ANTITHROMB III FUNC: 90 % (ref 75–120)

## 2017-12-09 LAB — HEPARIN LEVEL (UNFRACTIONATED)
Heparin Unfractionated: 0.55 IU/mL (ref 0.30–0.70)
Heparin Unfractionated: 0.41 IU/mL (ref 0.30–0.70)

## 2017-12-09 MED ORDER — ASPIRIN EC 81 MG PO TBEC: 81.0000 mg | DELAYED_RELEASE_TABLET | Freq: Every day | ORAL | Status: DC

## 2017-12-09 NOTE — Consult Note (Signed)
ELECTROPHYSIOLOGY CONSULT NOTE  Patient ID: [name], MRN: [id number], DOB/AGE: 69-May-1952 69 y.o. Admit date: 12/07/2017 Date of Consult: 12/09/2017  Primary Physician: Christine Brunette, MD Primary Cardiologist: [name] is a 67 y.o. 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    She has a history of palpitations.  Holter monitoring to the care of Dr. Dorma [name].  PACs and PVCs were documented but no atrial fibrillation.   She has dyspnea on exertion and peripheral edema.  She does not have exertional chest discomfort   she has sleep disordered breathing and actually sleeps upright in a chair.  She has never had a sleep study. She is morbidly obese.  She was at 370 pounds and lost down to 225. In the last year her weight is back up to 275 following the death of her husband to Lewy body dementia;  she is struggling      Chart describes atrial fibrillation   I do NOT find documentation   Past Medical History:  Diagnosis Date  . Arthritis   . CHF (congestive heart failure) (HCC)   . Essential hypertension 10/15/2015  . Heart murmur   . Hypertension   . Hypothyroidism 10/15/2015  . 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"      Surgical History:  Past Surgical History:  Procedure Laterality Date  . CHOLECYSTECTOMY    . DILATION AND CURETTAGE OF UTERUS    . OMENTECTOMY N/A 10/12/2015   Procedure: PARTIAL OMENTECTOMY;  Surgeon: Christine Gelinas, MD;  Location: [facility] OR;  Service: General;  Laterality: N/A;  . TOTAL KNEE ARTHROPLASTY Left 04/04/2016   Procedure: LEFT TOTAL KNEE ARTHROPLASTY;  Surgeon: Christine Gross, MD;  Location: [facility] ORS;  Service: Orthopedics;  Laterality: Left;  . TOTAL KNEE  ARTHROPLASTY Right 08/01/2016   Procedure: TOTAL KNEE ARTHROPLASTY;  Surgeon: Christine Gross, MD;  Location: [facility] ORS;  Service: Orthopedics;  Laterality: Right;  . UMBILICAL HERNIA REPAIR N/A 10/12/2015   Procedure: HERNIA REPAIR UMBILICAL ADULT/INCARERATED;  Surgeon: Christine Gelinas, MD;  Location: [facility] OR;  Service: General;  Laterality: N/A;     Home Meds: Prior to Admission medications   Medication Sig Start Date End Date Taking? Authorizing Provider  acetaminophen (TYLENOL) 650 MG CR tablet Take 1,300 mg by mouth at bedtime.   Yes [provider]  Calcium Carbonate (CALCIUM 600 PO) Take 1 tablet by mouth daily.   Yes [provider]  furosemide (LASIX) 20 MG tablet Take 40 mg by mouth every morning.    Yes [provider]  levothyroxine (SYNTHROID, LEVOTHROID) 75 MCG tablet Take 75 mcg by mouth daily before breakfast.  09/07/15  Yes [provider]  metoprolol (LOPRESSOR) 100 MG tablet Take 100 mg by mouth 2 (two) times daily.  10/05/15  Yes [provider]  Vitamin D, Cholecalciferol, 1000 units TABS Take 2,000 Units by mouth daily.   Yes [provider]    Inpatient Medications:  . calcium carbonate  1 tablet Oral BID WC  . cholecalciferol  2,000 Units Oral Daily  . docusate sodium  100 mg Oral BID  . furosemide  40 mg Oral q morning - 10a  .  levothyroxine  75 mcg Oral QAC breakfast  . metoprolol tartrate  100 mg Oral BID     Allergies:  Allergies  Allergen Reactions  . Augmentin [Amoxicillin-Pot Clavulanate] Hives and Itching    Has patient had a PCN reaction causing immediate rash, facial/tongue/throat swelling, SOB or lightheadedness with hypotension:No Has patient had a PCN reaction causing severe rash involving mucus membranes or skin necrosis:No Has patient had a PCN reaction that required hospitalization:No Has patient had a PCN reaction occurring within the last 10 years:No If all of the above answers are "NO", then may  proceed with Cephalosporin use.   . Pneumococcal Vaccines Other (See Comments)    Caused fever, and swelling at injection site    Social History   Socioeconomic History  . Marital status: Widowed    Spouse name: Not on file  . Number of children: Not on file  . Years of education: Not on file  . Highest education level: Not on file  Occupational History  . Not on file  Social Needs  . Financial resource strain: Not on file  . Food insecurity:    Worry: Not on file    Inability: Not on file  . Transportation needs:    Medical: Not on file    Non-medical: Not on file  Tobacco Use  . Smoking status: Former Smoker    Last attempt to quit: 03/29/1987    Years since quitting: 30.7  . Smokeless tobacco: Never Used  Substance and Sexual Activity  . Alcohol use: No  . Drug use: No  . Sexual activity: Not on file  Lifestyle  . Physical activity:    Days per week: Not on file    Minutes per session: Not on file  . Stress: Not on file  Relationships  . Social connections:    Talks on phone: Not on file    Gets together: Not on file    Attends religious service: Not on file    Active member of club or organization: Not on file    Attends meetings of clubs or organizations: Not on file    Relationship status: Not on file  . Intimate partner violence:    Fear of current or ex partner: Not on file    Emotionally abused: Not on file    Physically abused: Not on file    Forced sexual activity: Not on file  Other Topics Concern  . Not on file  Social History Narrative  . Not on file     Family History  Problem Relation Age of Onset  . Rheum arthritis Mother   . Heart attack Father   . Heart disease Father      ROS:  Please see the history of present illness.     All other systems reviewed and negative.    Physical Exam:  Blood pressure 135/64, pulse 64, temperature 98 F (36.7 C), temperature source Oral, resp. rate 16, height 5\' 7"  (1.702 m), weight 277 lb 3.2 oz  (125.7 kg), SpO2 95 %. General: Well developed, well nourished female in no acute distress. Head: Normocephalic, atraumatic, sclera non-icteric, no xanthomas, nares are without discharge. EENT: normal Lymph Nodes:  none Back: without scoliosis/kyphosis, no CVA tendersness Neck: Negative for carotid bruits. JVD not elevated. Lungs: Clear bilaterally to auscultation without wheezes, rales, or rhonchi. Breathing is unlabored. Heart: RRR with S1 S2. 2/6 murmur , rubs, or gallops appreciated. Abdomen: Soft, non-tender, non-distended with normoactive bowel sounds. No hepatomegaly. No rebound/guarding. No obvious  abdominal masses. Msk:  Strength and tone appear normal for age. Extremities: No clubbing or cyanosis. No  edema.  Distal pedal pulses are 2+ and equal bilaterally. Skin: Warm and Dry Neuro: Alert and oriented X 3. CN III-XII intact Grossly normal sensory and motor function . Psych:  Responds to questions appropriately with a normal affect.      Labs: Cardiac Enzymes Recent Labs    12/07/17 2147  TROPONINI <0.03   CBC Lab Results  Component Value Date   WBC 10.9 12/09/2017   HGB 12.7 12/09/2017   HCT 36.5 12/09/2017   MCV 88.6 12/09/2017   PLT 149 (L) 12/09/2017   PROTIME: Recent Labs    12/08/17 0547  LABPROT 14.4  INR 1.13   Chemistry  Recent Labs  Lab 12/07/17 2147  NA 139  K 3.8  CL 103  CO2 29  BUN 39*  CREATININE 0.80  CALCIUM 8.8*  PROT 7.6  BILITOT 0.5  ALKPHOS 114  ALT 18  AST 36  GLUCOSE 104*   Lipids No results found for: CHOL, HDL, LDLCALC, TRIG BNP No results found for: PROBNP Thyroid Function Tests: Recent Labs    12/07/17 2147  TSH 1.591      Miscellaneous No results found for: DDIMER  Radiology/Studies:  Ct Abdomen Pelvis W Contrast  Result Date: 12/08/2017 CLINICAL DATA:  Mid upper abdominal pain, nausea and diarrhea starting at 1630 hours. EXAM: CT ABDOMEN AND PELVIS WITH CONTRAST TECHNIQUE: Multidetector CT imaging of  the abdomen and pelvis was performed using the standard protocol following bolus administration of intravenous contrast. CONTRAST:  OMNIPAQUE IOHEXOL 300 MG/ML  SOLN COMPARISON:  CT report 05/05/2002 FINDINGS: Lower chest: Top-normal size heart without pericardial effusion. There is coronary arteriosclerosis. Minimal bibasilar atelectasis. Hepatobiliary: No focal liver abnormality is seen. Status post cholecystectomy. No biliary dilatation. Pancreas: Unremarkable. No pancreatic ductal dilatation or surrounding inflammatory changes. Spleen: Hypodense appearance of the medial upper pole of the spleen suspicious for splenic infarct. No subcapsular fluid to suggest a laceration. No splenomegaly. No splenic mass. Adrenals/Urinary Tract: Normal bilateral adrenal glands. Tiny too small to characterize hypodensities in the lower pole the left kidney likely to represent small cysts. No nephrolithiasis nor obstructive uropathy. The urinary bladder is unremarkable. Stomach/Bowel: Small hiatal hernia. Contracted stomach. No small bowel dilatation or obstruction. No inflammation. Normal appendix. Moderate stool burden within the colon. There is colonic diverticulosis along the descending sigmoid colon without acute diverticulitis. Vascular/Lymphatic: Mild aortoiliac atherosclerosis without aneurysm or dissection. No significant pleural plaquing. Patent splenic artery without aneurysm. Patent portal and splenic veins. Reproductive: Uterus and bilateral adnexa are unremarkable. Other: No abdominal wall hernia or abnormality. No abdominopelvic ascites. Musculoskeletal: T10 through L2 degenerative disc disease with disc flattening. Disc flattening also noted at L5-S1. No suspicious osseous lesions nor fracture. 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 IMPRESSION: 1. Segmental area of hypoattenuation involving the upper pole of the spleen consistent with a splenic  infarct. No subcapsular fluid to suggest splenic laceration. Given linear appearance, splenic abscess or cyst is not likely. Etiologies could be hematologic or possibly embolic such as from atrial fibrillation. 2. Colonic diverticulosis without acute diverticulitis. 3. Lumbar spondylosis. 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 Electronically Signed   By: Tollie Eth M.D.   On: 12/08/2017 02:04    EKG : sinus Tel limited to last 12 hrs   Echo >>  LV function normal  LA size mildly enlarged (  42/1.67/32) Assessment and Plan:  Splenic Infarct  Palpitations with holter >>PACs APVCs  My baseline knowledge of splenic infarction management is scant.  A brief review from up-to-date outlines a broad differential diagnosis of which embolization from atrial fibrillation is 1.  There are multiple other issues including malignancy or hypercoagulable disorder better on the list.  With her history of palpitations not withstanding her negative Holter, it is reasonable to consider implantation of a Linq monitor for detection of atrial fibrillation.     In addition, up-to-date suggest that pain relief is the primary therapy in the absence of a specific indication for alternative therapy.  In that case, I do not think anticoagulation is indicated at this time.  If she is to be discharged before Tuesday, please let our office know arrange implantation as an outpatient of the Linq monitor   Sherryl Manges

## 2017-12-09 NOTE — Discharge Instructions (Signed)
Resume diet and activity as before ° ° °

## 2017-12-09 NOTE — Progress Notes (Signed)
ANTICOAGULATION CONSULT NOTE -  Consult  Pharmacy Consult for heparin drip Indication: suspected arterial thrombus  Allergies  Allergen Reactions  . Augmentin [Amoxicillin-Pot Clavulanate] Hives and Itching    Has patient had a PCN reaction causing immediate rash, facial/tongue/throat swelling, SOB or lightheadedness with hypotension:No Has patient had a PCN reaction causing severe rash involving mucus membranes or skin necrosis:No Has patient had a PCN reaction that required hospitalization:No Has patient had a PCN reaction occurring within the last 10 years:No If all of the above answers are "NO", then may proceed with Cephalosporin use.   . Pneumococcal Vaccines Other (See Comments)    Caused fever, and swelling at injection site    Patient Measurements: Height: 5\' 7"  (170.2 cm) Weight: 277 lb 3.2 oz (125.7 kg) IBW/kg (Calculated) : 61.6 Heparin Dosing Weight: 91 kg  Vital Signs: Temp: 98 F (36.7 C) (04/20 0742) Temp Source: Oral (04/20 0742) BP: 135/64 (04/20 0742) Pulse Rate: 64 (04/20 0742)  Labs: Recent Labs    12/07/17 2147 12/08/17 0547  12/08/17 1859 12/09/17 0413 12/09/17 1044  HGB 13.2  --   --   --  12.7  --   HCT 38.9  --   --   --  36.5  --   PLT 223  --   --   --  149*  --   APTT  --  30  --   --   --   --   LABPROT  --  14.4  --   --   --   --   INR  --  1.13  --   --   --   --   HEPARINUNFRC  --   --    < > 0.89* 0.55 0.41  CREATININE 0.80  --   --   --   --   --   TROPONINI <0.03  --   --   --   --   --    < > = values in this interval not displayed.    Estimated Creatinine Clearance: 92.7 mL/min (by C-G formula based on SCr of 0.8 mg/dL).   Medical History: Past Medical History:  Diagnosis Date  . Arthritis   . CHF (congestive heart failure) (HCC)   . Essential hypertension 10/15/2015  . Heart murmur   . Hypertension   . Hypothyroidism 10/15/2015  . 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"    Medications:   No anticoagulation in PTA meds  Assessment:  Goal of Therapy:  Heparin level 0.3-0.7 units/ml Monitor platelets by anticoagulation protocol: Yes   Plan:  5000 unit bolus and initial rate of 1600 units/hr. First heparin level 6 hours after start of infusion.  4/19@1550  HL 0.93, reduce dose of heparin to heparin iv 1400units./hr recheck in 6 hours at 2100  4/19 1900 heparin level 0.89. Decrease rate to 1200 units/hr. Recheck heparin level and CBC with tomorrow AM labs.  4/20 AM heparin level 0.55. Continue current regimen. Recheck in 6 hours to confirm.  4/20 1045 HL 0.41 therapeutic level. Heparin drip now d/c'd  Crist FatWang, Madisin Hasan L 12/09/2017,1:32 PM

## 2017-12-09 NOTE — Plan of Care (Signed)
  Problem: Education: Goal: Knowledge of General Education information will improve Outcome: Progressing   Problem: Health Behavior/Discharge Planning: Goal: Ability to manage health-related needs will improve Outcome: Progressing   Problem: Clinical Measurements: Goal: Ability to maintain clinical measurements within normal limits will improve Outcome: Progressing   Problem: Coping: Goal: Level of anxiety will decrease Outcome: Progressing   Problem: Elimination: Goal: Will not experience complications related to urinary retention Outcome: Progressing   Problem: Safety: Goal: Ability to remain free from injury will improve Outcome: Progressing   Problem: Skin Integrity: Goal: Risk for impaired skin integrity will decrease Outcome: Progressing   

## 2017-12-09 NOTE — H&P (View-Only) (Signed)
ELECTROPHYSIOLOGY CONSULT NOTE  Patient ID: [name], MRN: [id number], DOB/AGE: 69-May-1952 69 y.o. Admit date: 12/07/2017 Date of Consult: 12/09/2017  Primary Physician: [name], MD Primary Cardiologist: [name] is a 67 y.o. 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    She has a history of palpitations.  Holter monitoring to the care of Dr. Dorma Russell.  PACs and PVCs were documented but no atrial fibrillation.   She has dyspnea on exertion and peripheral edema.  She does not have exertional chest discomfort   she has sleep disordered breathing and actually sleeps upright in a chair.  She has never had a sleep study. She is morbidly obese.  She was at 370 pounds and lost down to 225. In the last year her weight is back up to 275 following the death of her husband to Lewy body dementia;  she is struggling      Chart describes atrial fibrillation   I do NOT find documentation   Past Medical History:  Diagnosis Date  . Arthritis   . CHF (congestive heart failure) (HCC)   . Essential hypertension 10/15/2015  . Heart murmur   . Hypertension   . Hypothyroidism 10/15/2015  . 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"      Surgical History:  Past Surgical History:  Procedure Laterality Date  . CHOLECYSTECTOMY    . DILATION AND CURETTAGE OF UTERUS    . OMENTECTOMY N/A 10/12/2015   Procedure: PARTIAL OMENTECTOMY;  Surgeon: Violeta Gelinas, MD;  Location: [facility] OR;  Service: General;  Laterality: N/A;  . TOTAL KNEE ARTHROPLASTY Left 04/04/2016   Procedure: LEFT TOTAL KNEE ARTHROPLASTY;  Surgeon: Ollen Gross, MD;  Location: [facility];  Service: Orthopedics;  Laterality: Left;  . TOTAL KNEE  ARTHROPLASTY Right 08/01/2016   Procedure: TOTAL KNEE ARTHROPLASTY;  Surgeon: Ollen Gross, MD;  Location: [facility];  Service: Orthopedics;  Laterality: Right;  . UMBILICAL HERNIA REPAIR N/A 10/12/2015   Procedure: HERNIA REPAIR UMBILICAL ADULT/INCARERATED;  Surgeon: Violeta Gelinas, MD;  Location: [facility] OR;  Service: General;  Laterality: N/A;     Home Meds: Prior to Admission medications   Medication Sig Start Date End Date Taking? Authorizing Provider  acetaminophen (TYLENOL) 650 MG CR tablet Take 1,300 mg by mouth at bedtime.   Yes [provider]  Calcium Carbonate (CALCIUM 600 PO) Take 1 tablet by mouth daily.   Yes [provider]  furosemide (LASIX) 20 MG tablet Take 40 mg by mouth every morning.    Yes [provider]  levothyroxine (SYNTHROID, LEVOTHROID) 75 MCG tablet Take 75 mcg by mouth daily before breakfast.  09/07/15  Yes [provider]  metoprolol (LOPRESSOR) 100 MG tablet Take 100 mg by mouth 2 (two) times daily.  10/05/15  Yes [provider]  Vitamin D, Cholecalciferol, 1000 units TABS Take 2,000 Units by mouth daily.   Yes [provider]    Inpatient Medications:  . calcium carbonate  1 tablet Oral BID WC  . cholecalciferol  2,000 Units Oral Daily  . docusate sodium  100 mg Oral BID  . furosemide  40 mg Oral q morning - 10a  .  levothyroxine  75 mcg Oral QAC breakfast  . metoprolol tartrate  100 mg Oral BID     Allergies:  Allergies  Allergen Reactions  . Augmentin [Amoxicillin-Pot Clavulanate] Hives and Itching    Has patient had a PCN reaction causing immediate rash, facial/tongue/throat swelling, SOB or lightheadedness with hypotension:No Has patient had a PCN reaction causing severe rash involving mucus membranes or skin necrosis:No Has patient had a PCN reaction that required hospitalization:No Has patient had a PCN reaction occurring within the last 10 years:No If all of the above answers are "NO", then may  proceed with Cephalosporin use.   . Pneumococcal Vaccines Other (See Comments)    Caused fever, and swelling at injection site    Social History   Socioeconomic History  . Marital status: Widowed    Spouse name: Not on file  . Number of children: Not on file  . Years of education: Not on file  . Highest education level: Not on file  Occupational History  . Not on file  Social Needs  . Financial resource strain: Not on file  . Food insecurity:    Worry: Not on file    Inability: Not on file  . Transportation needs:    Medical: Not on file    Non-medical: Not on file  Tobacco Use  . Smoking status: Former Smoker    Last attempt to quit: 03/29/1987    Years since quitting: 30.7  . Smokeless tobacco: Never Used  Substance and Sexual Activity  . Alcohol use: No  . Drug use: No  . Sexual activity: Not on file  Lifestyle  . Physical activity:    Days per week: Not on file    Minutes per session: Not on file  . Stress: Not on file  Relationships  . Social connections:    Talks on phone: Not on file    Gets together: Not on file    Attends religious service: Not on file    Active member of club or organization: Not on file    Attends meetings of clubs or organizations: Not on file    Relationship status: Not on file  . Intimate partner violence:    Fear of current or ex partner: Not on file    Emotionally abused: Not on file    Physically abused: Not on file    Forced sexual activity: Not on file  Other Topics Concern  . Not on file  Social History Narrative  . Not on file     Family History  Problem Relation Age of Onset  . Rheum arthritis Mother   . Heart attack Father   . Heart disease Father      ROS:  Please see the history of present illness.     All other systems reviewed and negative.    Physical Exam:  Blood pressure 135/64, pulse 64, temperature 98 F (36.7 C), temperature source Oral, resp. rate 16, height 5\' 7"  (1.702 m), weight 277 lb 3.2 oz  (125.7 kg), SpO2 95 %. General: Well developed, well nourished female in no acute distress. Head: Normocephalic, atraumatic, sclera non-icteric, no xanthomas, nares are without discharge. EENT: normal Lymph Nodes:  none Back: without scoliosis/kyphosis, no CVA tendersness Neck: Negative for carotid bruits. JVD not elevated. Lungs: Clear bilaterally to auscultation without wheezes, rales, or rhonchi. Breathing is unlabored. Heart: RRR with S1 S2. 2/6 murmur , rubs, or gallops appreciated. Abdomen: Soft, non-tender, non-distended with normoactive bowel sounds. No hepatomegaly. No rebound/guarding. No obvious  abdominal masses. Msk:  Strength and tone appear normal for age. Extremities: No clubbing or cyanosis. No  edema.  Distal pedal pulses are 2+ and equal bilaterally. Skin: Warm and Dry Neuro: Alert and oriented X 3. CN III-XII intact Grossly normal sensory and motor function . Psych:  Responds to questions appropriately with a normal affect.      Labs: Cardiac Enzymes Recent Labs    12/07/17 2147  TROPONINI <0.03   CBC Lab Results  Component Value Date   WBC 10.9 12/09/2017   HGB 12.7 12/09/2017   HCT 36.5 12/09/2017   MCV 88.6 12/09/2017   PLT 149 (L) 12/09/2017   PROTIME: Recent Labs    12/08/17 0547  LABPROT 14.4  INR 1.13   Chemistry  Recent Labs  Lab 12/07/17 2147  NA 139  K 3.8  CL 103  CO2 29  BUN 39*  CREATININE 0.80  CALCIUM 8.8*  PROT 7.6  BILITOT 0.5  ALKPHOS 114  ALT 18  AST 36  GLUCOSE 104*   Lipids No results found for: CHOL, HDL, LDLCALC, TRIG BNP No results found for: PROBNP Thyroid Function Tests: Recent Labs    12/07/17 2147  TSH 1.591      Miscellaneous No results found for: DDIMER  Radiology/Studies:  Ct Abdomen Pelvis W Contrast  Result Date: 12/08/2017 CLINICAL DATA:  Mid upper abdominal pain, nausea and diarrhea starting at 1630 hours. EXAM: CT ABDOMEN AND PELVIS WITH CONTRAST TECHNIQUE: Multidetector CT imaging of  the abdomen and pelvis was performed using the standard protocol following bolus administration of intravenous contrast. CONTRAST:  OMNIPAQUE IOHEXOL 300 MG/ML  SOLN COMPARISON:  CT report 05/05/2002 FINDINGS: Lower chest: Top-normal size heart without pericardial effusion. There is coronary arteriosclerosis. Minimal bibasilar atelectasis. Hepatobiliary: No focal liver abnormality is seen. Status post cholecystectomy. No biliary dilatation. Pancreas: Unremarkable. No pancreatic ductal dilatation or surrounding inflammatory changes. Spleen: Hypodense appearance of the medial upper pole of the spleen suspicious for splenic infarct. No subcapsular fluid to suggest a laceration. No splenomegaly. No splenic mass. Adrenals/Urinary Tract: Normal bilateral adrenal glands. Tiny too small to characterize hypodensities in the lower pole the left kidney likely to represent small cysts. No nephrolithiasis nor obstructive uropathy. The urinary bladder is unremarkable. Stomach/Bowel: Small hiatal hernia. Contracted stomach. No small bowel dilatation or obstruction. No inflammation. Normal appendix. Moderate stool burden within the colon. There is colonic diverticulosis along the descending sigmoid colon without acute diverticulitis. Vascular/Lymphatic: Mild aortoiliac atherosclerosis without aneurysm or dissection. No significant pleural plaquing. Patent splenic artery without aneurysm. Patent portal and splenic veins. Reproductive: Uterus and bilateral adnexa are unremarkable. Other: No abdominal wall hernia or abnormality. No abdominopelvic ascites. Musculoskeletal: T10 through L2 degenerative disc disease with disc flattening. Disc flattening also noted at L5-S1. No suspicious osseous lesions nor fracture. 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 IMPRESSION: 1. Segmental area of hypoattenuation involving the upper pole of the spleen consistent with a splenic  infarct. No subcapsular fluid to suggest splenic laceration. Given linear appearance, splenic abscess or cyst is not likely. Etiologies could be hematologic or possibly embolic such as from atrial fibrillation. 2. Colonic diverticulosis without acute diverticulitis. 3. Lumbar spondylosis. 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 Electronically Signed   By: Tollie Eth M.D.   On: 12/08/2017 02:04    EKG : sinus Tel limited to last 12 hrs   Echo >>  LV function normal  LA size mildly enlarged (  42/1.67/32) Assessment and Plan:  Splenic Infarct  Palpitations with holter >>PACs APVCs  My baseline knowledge of splenic infarction management is scant.  A brief review from up-to-date outlines a broad differential diagnosis of which embolization from atrial fibrillation is 1.  There are multiple other issues including malignancy or hypercoagulable disorder better on the list.  With her history of palpitations not withstanding her negative Holter, it is reasonable to consider implantation of a Linq monitor for detection of atrial fibrillation.     In addition, up-to-date suggest that pain relief is the primary therapy in the absence of a specific indication for alternative therapy.  In that case, I do not think anticoagulation is indicated at this time.  If she is to be discharged before Tuesday, please let our office know arrange implantation as an outpatient of the Linq monitor   Sherryl Manges

## 2017-12-11 LAB — LUPUS ANTICOAGULANT PANEL
DRVVT: 37.2 s (ref 0.0–47.0)
PTT Lupus Anticoagulant: 43.6 s (ref 0.0–51.9)

## 2017-12-11 LAB — PROTEIN S ACTIVITY: PROTEIN S ACTIVITY: 70 % (ref 63–140)

## 2017-12-11 LAB — PROTEIN C, TOTAL: Protein C, Total: 85 % (ref 60–150)

## 2017-12-11 LAB — HOMOCYSTEINE: Homocysteine: 13.9 umol/L (ref 0.0–15.0)

## 2017-12-11 LAB — PROTEIN S, TOTAL: Protein S Ag, Total: 119 % (ref 60–150)

## 2017-12-11 LAB — PROTEIN C ACTIVITY: Protein C Activity: 106 % (ref 73–180)

## 2017-12-12 LAB — BETA-2-GLYCOPROTEIN I ABS, IGG/M/A
Beta-2 Glyco I IgG: <9 GPI IgG units (ref 0–20)
Beta-2-Glycoprotein I IgA: <9 GPI IgA units (ref 0–25)
Beta-2-Glycoprotein I IgM: <9 GPI IgM units (ref 0–32)

## 2017-12-12 LAB — CARDIOLIPIN ANTIBODIES, IGG, IGM, IGA: ANTICARDIOLIPIN IGM: 12 [MPL'U]/mL (ref 0–12)

## 2017-12-13 ENCOUNTER — Telehealth: Payer: Self-pay | Admitting: *Deleted

## 2017-12-13 ENCOUNTER — Encounter: Payer: Self-pay | Admitting: *Deleted

## 2017-12-13 LAB — FACTOR 5 LEIDEN

## 2017-12-13 NOTE — Telephone Encounter (Signed)
I called and spoke with the patient to discuss implantation of her LINQ monitor.  She is agreeable with this being done on 12/21/17 at 8:30 am.   She also states that Dr. Graciela HusbandsKlein spoke with her about an appointment with Dr. Cyndie ChimeGranfortuna- she was trying to follow up on this. I advised her I will need to review with Dr. Graciela HusbandsKlein and call her back about that.  She voices understanding.

## 2017-12-13 NOTE — Telephone Encounter (Signed)
-----   Message from Duke SalviaSteven C Klein, MD sent at 12/09/2017  1:23 PM EDT ----- Merla RichesLadies, I do not know whether she is a [place] person or I am seeing her in the hospital or whether she is a [place] person as she sees Dr. Dorma RussellJB which she will need a linq  for source of embolism to her's beingq thank you

## 2017-12-14 NOTE — Telephone Encounter (Signed)
Per Dr. Graciela HusbandsKlein- his discussion with Dr. Cyndie ChimeGranfortuna was that since the patient's labs came back normal, there was no issue with her having clotting disorder, therefore no further work up with hematology is necessary.  I have notified the patient of the above and she voices understanding.

## 2017-12-15 LAB — PROTHROMBIN GENE MUTATION

## 2017-12-19 ENCOUNTER — Other Ambulatory Visit: Payer: Self-pay | Admitting: Internal Medicine

## 2017-12-19 DIAGNOSIS — Z09 Encounter for follow-up examination after completed treatment for conditions other than malignant neoplasm: Secondary | ICD-10-CM | POA: Diagnosis not present

## 2017-12-19 DIAGNOSIS — I10 Essential (primary) hypertension: Secondary | ICD-10-CM | POA: Diagnosis not present

## 2017-12-19 DIAGNOSIS — E039 Hypothyroidism, unspecified: Secondary | ICD-10-CM | POA: Diagnosis not present

## 2017-12-20 NOTE — Discharge Summary (Signed)
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 Arthritis   . CHF (congestive heart failure) (HCC)   . Essential hypertension 10/15/2015  . Heart murmur   . Hypertension   . Hypothyroidism 10/15/2015  . 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"     ADMITTING HISTORY  Chief Complaint: Abdominal pain HPI: The patient with past medical history of hypertension, CHF, paroxysmal atrial fibrillation and hypothyroidism presents to the emergency department complaining of severe abdominal pain.  Patient states that her pain is mid epigastric to left upper quadrant and radiates through to her back.  The pain was constant but is now gradually improving.  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  Due to ongoing pain and potential embolization causing infarction to the spleen the emergency department staff, hospitalist service for further evaluation.  HOSPITAL COURSE:   *Splenic infarct.  Patient had pain due to this.  Improved quickly during the hospital stay and patient did not need any pain medications at discharge.  Seen by vascular surgery.  There were no indications for anticoagulation.  Discussed with Dr. Evie Lacks on day of discharge.  *Palpitations.  Patient recently had a Holter monitor which showed PACs.  No atrial fibrillation or flutter.  I discussed with Dr. Graciela Husbands of cardiology who saw the patient.  Suggested Linq monitor placement as outpatient.  Patient will follow-up with Dr. Graciela Husbands.  No anticoagulation.   No tachycardia in the hospital.  Patient's hypertension and hypothyroidism and CHF well-controlled.  Patient stable for discharge home to follow-up with cardiology for an Linq monitor and hematology for hypercoagulable work-up.  Hypercoagulable panel ordered which is pending.  CONSULTS OBTAINED:  Treatment Team:  Renford Dills, MD Lamar Blinks, MD Antonieta Iba, MD  DRUG ALLERGIES:   Allergies  Allergen Reactions  . Augmentin [Amoxicillin-Pot Clavulanate] Hives and Itching    Has patient had a PCN reaction causing immediate rash, facial/tongue/throat swelling, SOB or lightheadedness with hypotension:No Has patient had a PCN reaction causing severe rash involving mucus membranes or skin necrosis:No Has patient had a PCN reaction that required hospitalization:No Has patient had a PCN reaction occurring within the last 10 years:No If all of the above answers are "NO", then may proceed with Cephalosporin use.   . Pneumococcal Vaccines Other (See Comments)    Caused fever, and swelling at injection site    DISCHARGE MEDICATIONS:   Allergies as of 12/09/2017      Reactions   Augmentin [amoxicillin-pot Clavulanate] Hives, Itching   Has patient had a PCN reaction causing immediate rash, facial/tongue/throat swelling, SOB or lightheadedness with hypotension:No Has patient had a PCN reaction causing severe rash involving mucus membranes or skin necrosis:No Has patient had a PCN reaction that required hospitalization:No Has patient had a PCN reaction occurring within the last 10 years:No If all of the above answers are "NO", then may proceed with Cephalosporin use.   Pneumococcal Vaccines Other (See Comments)   Caused fever, and swelling at injection site  Medication List    TAKE these medications   acetaminophen 650 MG CR tablet Commonly known as:  TYLENOL Take 1,300 mg by mouth at bedtime.   aspirin EC 81 MG tablet Take 1 tablet (81 mg total) by mouth  daily.   CALCIUM 600 PO Take 1 tablet by mouth daily.   furosemide 20 MG tablet Commonly known as:  LASIX Take 40 mg by mouth every morning.   levothyroxine 75 MCG tablet Commonly known as:  SYNTHROID, LEVOTHROID Take 75 mcg by mouth daily before breakfast.   metoprolol tartrate 100 MG tablet Commonly known as:  LOPRESSOR Take 100 mg by mouth 2 (two) times daily.   Vitamin D (Cholecalciferol) 1000 units Tabs Take 2,000 Units by mouth daily.       Today   VITAL SIGNS:  Blood pressure 135/64, pulse 64, temperature 98 F (36.7 C), temperature source Oral, resp. rate 16, height  (1.702 m), weight 125.7 kg (277 lb 3.2 oz), SpO2 95 %.  I/O:  No intake or output data in the 24 hours ending 12/20/17 1514  PHYSICAL EXAMINATION:  Physical Exam  GENERAL:  67 y.o.-year-old patient lying in the bed with no acute distress.  LUNGS: Normal breath sounds bilaterally, no wheezing, rales,rhonchi or crepitation. No use of accessory muscles of respiration.  CARDIOVASCULAR: S1, S2 normal. No murmurs, rubs, or gallops.  ABDOMEN: Soft, non-tender, non-distended. Bowel sounds present. No organomegaly or mass.  NEUROLOGIC: Moves all 4 extremities. PSYCHIATRIC: The patient is alert and oriented x 3.  SKIN: No obvious rash, lesion, or ulcer.   DATA REVIEW:   CBC No results for input(s): WBC, HGB, HCT, PLT in the last 168 hours.  Chemistries  No results for input(s): NA, K, CL, CO2, GLUCOSE, BUN, CREATININE, CALCIUM, MG, AST, ALT, ALKPHOS, BILITOT in the last 168 hours.  Invalid input(s): GFRCGP  Cardiac Enzymes No results for input(s): TROPONINI in the last 168 hours.  Microbiology Results  Results for orders placed or performed during the hospital encounter of 07/27/16  Surgical pcr screen     Status: Abnormal   Collection Time: 07/27/16  9:00 AM  Result Value Ref Range Status   MRSA, PCR NEGATIVE NEGATIVE Final   Staphylococcus aureus POSITIVE (A) NEGATIVE Final    Comment:         The Xpert SA Assay (FDA approved for NASAL specimens in patients over 3 years of age), is one component of a comprehensive surveillance program.  Test performance has been validated by [facility] for patients greater than or equal to 52 year old. It is not intended to diagnose infection nor to guide or monitor treatment.     RADIOLOGY:  No results found.  Follow up with PCP in 1 week.  Management plans discussed with the patient, family and they are in agreement.  CODE STATUS:  Code Status History    Date Active Date Inactive Code Status Order ID Comments User Context   12/08/2017 0610 12/09/2017 1916 Full Code [id number]  Arnaldo Natal, MD ED   08/01/2016 1320 08/03/2016 1456 Full Code [id number]  Ollen Gross, MD Inpatient   04/04/2016 1140 04/06/2016 1533 Full Code [id number]  Ollen Gross, MD Inpatient    Advance Directive Documentation     Most Recent Value  Type of Advance Directive  Healthcare Power of Attorney, Living will  Pre-existing out of facility DNR order (yellow form or pink MOST form)  -  "MOST" Form in Place?  -  TOTAL TIME TAKING CARE OF THIS PATIENT ON DAY OF DISCHARGE: more than 30 minutes.   Molinda Bailiff Promiss Labarbera M.D on 12/20/2017 at 3:14 PM  Between 7am to 6pm - Pager - [phone]  After 6pm go to [email or web address] - password [email or web address]  [facility]  Office  [phone]  CC: Primary care physician; [name], MD  Note: This dictation was prepared with Dragon dictation along with smaller phrase technology. Any transcriptional errors that result from this process are unintentional.

## 2017-12-21 ENCOUNTER — Ambulatory Visit
Admission: RE | Admit: 2017-12-21 | Discharge: 2017-12-21 | Disposition: A | Discharge status: Home / Self Care | Payer: Medicare Other | Source: Ambulatory Visit | Attending: Internal Medicine | Admitting: Internal Medicine

## 2017-12-21 ENCOUNTER — Encounter: Payer: Self-pay | Admitting: Internal Medicine

## 2017-12-21 ENCOUNTER — Encounter
Admission: RE | Disposition: A | Discharge status: Home / Self Care | Payer: Self-pay | Source: Ambulatory Visit | Attending: Internal Medicine

## 2017-12-21 DIAGNOSIS — I509 Heart failure, unspecified: Secondary | ICD-10-CM | POA: Diagnosis not present

## 2017-12-21 DIAGNOSIS — Z87891 Personal history of nicotine dependence: Secondary | ICD-10-CM | POA: Diagnosis not present

## 2017-12-21 DIAGNOSIS — D735 Infarction of spleen: Secondary | ICD-10-CM | POA: Diagnosis not present

## 2017-12-21 DIAGNOSIS — E039 Hypothyroidism, unspecified: Secondary | ICD-10-CM | POA: Insufficient documentation

## 2017-12-21 DIAGNOSIS — Z6841 Body Mass Index (BMI) 40.0 and over, adult: Secondary | ICD-10-CM | POA: Diagnosis not present

## 2017-12-21 DIAGNOSIS — Z79899 Other long term (current) drug therapy: Secondary | ICD-10-CM | POA: Diagnosis not present

## 2017-12-21 DIAGNOSIS — Z887 Allergy status to serum and vaccine status: Secondary | ICD-10-CM | POA: Insufficient documentation

## 2017-12-21 DIAGNOSIS — I110 Hypertensive heart disease with heart failure: Secondary | ICD-10-CM | POA: Diagnosis not present

## 2017-12-21 DIAGNOSIS — Z96653 Presence of artificial knee joint, bilateral: Secondary | ICD-10-CM | POA: Diagnosis not present

## 2017-12-21 DIAGNOSIS — Z79890 Hormone replacement therapy (postmenopausal): Secondary | ICD-10-CM | POA: Insufficient documentation

## 2017-12-21 HISTORY — PX: LOOP RECORDER INSERTION: EP1214

## 2017-12-21 SURGERY — LOOP RECORDER INSERTION
Anesthesia: LOCAL

## 2017-12-21 SURGICAL SUPPLY — 2 items
LOOP REVEAL LINQSYS (Prosthesis & Implant Heart) ×2 IMPLANT
PACK LOOP INSERTION (CUSTOM PROCEDURE TRAY) ×2 IMPLANT

## 2017-12-21 NOTE — Discharge Instructions (Signed)
Implantable Loop Recorder Placement, Care After  Refer to this sheet in the next few weeks. These instructions provide you with information about caring for yourself after your procedure. Your health care provider may also give you more specific instructions. Your treatment has been planned according to current medical practices, but problems sometimes occur. Call your health care provider if you have any problems or questions after your procedure.  What can I expect after the procedure?  After the procedure, it is common to have:   Soreness or pain near the cut from surgery (incision).   Some swelling or bruising near the incision.    Follow these instructions at home:  Medicines   Take over-the-counter and prescription medicines only as told by your health care provider.   If you were prescribed an antibiotic medicine, take it as told by your health care provider. Do not stop taking the antibiotic even if you start to feel better.  Bathing   Do not take baths, swim, or use a hot tub until your health care provider approves. Ask your health care provider if you can take showers. You may only be allowed to take sponge baths for bathing.  Incision care   Follow instructions from your health care provider about how to take care of your incision. Make sure you:  ? Wash your hands with soap and water before you change your bandage (dressing). If soap and water are not available, use hand sanitizer.  ? Change your dressing as told by your health care provider.  ? Keep your dressing dry.  ? Leave stitches (sutures), skin glue, or adhesive strips in place. These skin closures may need to stay in place for 2 weeks or longer. If adhesive strip edges start to loosen and curl up, you may trim the loose edges. Do not remove adhesive strips completely unless your health care provider tells you to do that.   Check your incision area every day for signs of infection. Check for:  ? More redness, swelling, or pain.  ? Fluid  or blood.  ? Warmth.  ? Pus or a bad smell.  Driving   If you received a sedative, do not drive for 24 hours after the procedure.   If you did not receive a sedative, ask your health care provider when it is safe to drive.  Activity   Return to your normal activities as told by your health care provider. Ask your health care provider what activities are safe for you.   Until your health care provider says it is safe:  ? Do not lift anything that is heavier than 10 lb (4.5 kg).  ? Do not do activities that involve lifting your arms over your head.  General instructions     Follow instructions from your health care provider about how and when to use your implantable loop recorder.   Do not go through a metal detection gate, and do not let someone hold a metal detector over your chest. Show your ID card.   Do not have an MRI unless you check with your health care provider first.   Do not use any tobacco products, such as cigarettes, chewing tobacco, and e-cigarettes. Tobacco can delay healing. If you need help quitting, ask your health care provider.   Keep all follow-up visits as told by your health care provider. This is important.  Contact a health care provider if:   You have more redness, swelling, or pain around your incision.     You have more fluid or blood coming from your incision.   Your incision feels warm to the touch.   You have pus or a bad smell coming from your incision.   You have a fever.   You have pain that is not relieved by your pain medicine.   You have triggered your device because of fainting (syncope) or because of a heartbeat that feels like it is racing, slow, fluttering, or skipping (palpitations).  Get help right away if:   You have chest pain.   You have difficulty breathing.  This information is not intended to replace advice given to you by your health care provider. Make sure you discuss any questions you have with your health care provider.  Document Released:  07/20/2015 Document Revised: 01/14/2016 Document Reviewed: 05/13/2015  Elsevier Interactive Patient Education  2018 Elsevier Inc.

## 2017-12-21 NOTE — Interval H&P Note (Signed)
History and Physical Interval Note:  12/21/2017 8:33 AM  [name]  has presented today for surgery, with the diagnosis of Loop Recorder Insertion  Bedside   Med[employer]ronic Rep   Splenic Infarct   cc: Joneen Boers  The various methods of treatment have been discussed with the patient and family. After consideration of risks, benefits and other options for treatment, the patient has consented to  Procedure(s): LOOP RECORDER INSERTION (N/A) as a surgical intervention .  The patient's history has been reviewed, patient examined, no change in status, stable for surgery.  I have reviewed the patient's chart and labs.  Questions were answered to the patient's satisfaction.     Sherryl Manges

## 2017-12-21 NOTE — Progress Notes (Signed)
Patient post loop recorder placement, tolerated well per Dr Graciela Husbands, Leta Jungling here for procedure,and post placement teaching done with questions answered. Denies complaints. Vitals stable.

## 2017-12-26 DIAGNOSIS — F3340 Major depressive disorder, recurrent, in remission, unspecified: Secondary | ICD-10-CM | POA: Diagnosis not present

## 2017-12-26 DIAGNOSIS — I10 Essential (primary) hypertension: Secondary | ICD-10-CM | POA: Diagnosis not present

## 2017-12-26 DIAGNOSIS — I5032 Chronic diastolic (congestive) heart failure: Secondary | ICD-10-CM | POA: Diagnosis not present

## 2017-12-26 DIAGNOSIS — Z6841 Body Mass Index (BMI) 40.0 and over, adult: Secondary | ICD-10-CM | POA: Diagnosis not present

## 2017-12-27 ENCOUNTER — Telehealth: Payer: Self-pay | Admitting: Internal Medicine

## 2017-12-27 NOTE — Telephone Encounter (Signed)
Spoke with patient and explained how her home monitor works. [place] maybe the service isn't always good. Walked patient through how to send a remote transmission successfully. - Patient reports palpitations last night that have since been alleviated. I explained that a nurse will review her transmission once received.

## 2017-12-27 NOTE — Telephone Encounter (Signed)
Patient wants to know if something is wrong with her monitor because it has not transmitted since 12-25-17

## 2017-12-27 NOTE — Telephone Encounter (Signed)
Manual transmission received.  3 "AF" episodes detected on 12/26/17 between 1859-2027, last episode was 3hr duration, ECGs appear true AF.  Made patient aware.  She reports palpitations/rapid HR sensation during episodes.  She is agreeable to appointment with Dr. Graciela Husbands at the [facility] office on 12/28/17 at 9:00am to discuss plan.  Office address and info given to patient.  She is appreciative of assistance and denies additional questions or concerns at this time.  Episode ECGs faxed to Sherri Rad, RN for review by Dr. Graciela Husbands tomorrow.

## 2017-12-28 ENCOUNTER — Ambulatory Visit (INDEPENDENT_AMBULATORY_CARE_PROVIDER_SITE_OTHER): Payer: Medicare Other | Admitting: Internal Medicine

## 2017-12-28 ENCOUNTER — Encounter: Payer: Self-pay | Admitting: Internal Medicine

## 2017-12-28 VITALS — BP 126/78 | HR 57 | Ht 67.0 in | Wt 273.0 lb

## 2017-12-28 VITALS — BP 110/68 | HR 64 | Resp 16 | Ht 67.0 in | Wt 271.0 lb

## 2017-12-28 DIAGNOSIS — G4730 Sleep apnea, unspecified: Secondary | ICD-10-CM | POA: Diagnosis not present

## 2017-12-28 DIAGNOSIS — Z959 Presence of cardiac and vascular implant and graft, unspecified: Secondary | ICD-10-CM

## 2017-12-28 DIAGNOSIS — D735 Infarction of spleen: Secondary | ICD-10-CM | POA: Diagnosis not present

## 2017-12-28 DIAGNOSIS — G4719 Other hypersomnia: Secondary | ICD-10-CM | POA: Diagnosis not present

## 2017-12-28 MED ORDER — APIXABAN 5 MG PO TABS: 5.0000 mg | ORAL_TABLET | Freq: Two times a day (BID) | ORAL | 11 refills | Status: DC

## 2017-12-28 NOTE — Patient Instructions (Addendum)
Medication Instructions: - Your physician has recommended you make the following change in your medication:   1) STOP aspirin 2) START eliquis 5 mg- take 1 tablet by mouth TWICE daily  Labwork: - none ordered  Procedures/Testing: - none ordered  Follow-Up: - You have been referred to : [facility] for consultation regarding a sleep study  - Your physician recommends that you schedule a follow-up appointment in: 3 months with Dr. Graciela Husbands.   Any Additional Special Instructions Will Be Listed Below (If Applicable).     If you need a refill on your cardiac medications before your next appointment, please call your pharmacy.

## 2017-12-28 NOTE — Progress Notes (Signed)
Patient Care Team: [name], MD as PCP - General (Internal Medicine)   HPI  [name] is a 67 y.o. female Seen in follow-up for splenic infarct.  She is one-week status post LINQ insertion.  She had palpitations the night before last and device interrogation demonstrated atrial fibrillation.  She is here to discuss initiation of anticoagulation  She reports that there was bleeding from her wound site.  Steri-Strips are currently dry  Retrospectively, she has noted these episodes of irregular tachypalpitations.  They have occurred every few months and last typically about an hour.  They are disruptive and with some shortness of breath.  She has sleep disordered breathing.  She sleeps sitting up because of orthopnea and awakening at night  Records and Results Reviewed loop recorder strips  Past Medical History:  Diagnosis Date  . Arthritis   . CHF (congestive heart failure) (HCC)   . Essential hypertension 10/15/2015  . Heart murmur   . Hypertension   . Hypothyroidism 10/15/2015  . 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"    Past Surgical History:  Procedure Laterality Date  . CHOLECYSTECTOMY    . DILATION AND CURETTAGE OF UTERUS    . LOOP RECORDER INSERTION N/A 12/21/2017   Procedure: LOOP RECORDER INSERTION;  Surgeon: Duke Salvia, MD;  [facility] INVASIVE CV LAB;  Service: Cardiovascular;  Laterality: N/A;  . OMENTECTOMY N/A 10/12/2015   Procedure: PARTIAL OMENTECTOMY;  Surgeon: Violeta Gelinas, MD;  Location: [facility] OR;  Service: General;  Laterality: N/A;  . TOTAL KNEE ARTHROPLASTY Left 04/04/2016   Procedure: LEFT TOTAL KNEE ARTHROPLASTY;  Surgeon: Ollen Gross, MD;  Location: [facility] ORS;  Service: Orthopedics;  Laterality: Left;  . TOTAL KNEE ARTHROPLASTY Right 08/01/2016   Procedure: TOTAL KNEE ARTHROPLASTY;  Surgeon: Ollen Gross, MD;  Location: [facility] ORS;  Service: Orthopedics;  Laterality: Right;  . UMBILICAL HERNIA REPAIR N/A  10/12/2015   Procedure: HERNIA REPAIR UMBILICAL ADULT/INCARERATED;  Surgeon: Violeta Gelinas, MD;  Location: [facility] OR;  Service: General;  Laterality: N/A;    Current Meds  Medication Sig  . acetaminophen (TYLENOL) 650 MG CR tablet Take 1,300 mg by mouth at bedtime.  . Calcium Carbonate (CALCIUM 600 PO) Take 1 tablet by mouth daily.  . furosemide (LASIX) 20 MG tablet Take 40 mg by mouth every morning.   Marland Kitchen levothyroxine (SYNTHROID, LEVOTHROID) 75 MCG tablet Take 75 mcg by mouth daily before breakfast.   . metoprolol (LOPRESSOR) 100 MG tablet Take 100 mg by mouth 2 (two) times daily.   . Vitamin D, Cholecalciferol, 1000 units TABS Take 2,000 Units by mouth daily.  . [DISCONTINUED] aspirin EC 81 MG tablet Take 1 tablet (81 mg total) by mouth daily.    Allergies  Allergen Reactions  . Augmentin [Amoxicillin-Pot Clavulanate] Hives and Itching    Has patient had a PCN reaction causing immediate rash, facial/tongue/throat swelling, SOB or lightheadedness with hypotension:No Has patient had a PCN reaction causing severe rash involving mucus membranes or skin necrosis:No Has patient had a PCN reaction that required hospitalization:No Has patient had a PCN reaction occurring within the last 10 years:No If all of the above answers are "NO", then may proceed with Cephalosporin use.   . Pneumococcal Vaccines Other (See Comments)    Caused fever, and swelling at injection site      Review of Systems negative except from HPI and PMH  Physical Exam BP 126/78   Pulse (!) 57  Ht  (1.702 m)   Wt 273 lb (123.8 kg)   SpO2 100%   BMI 42.76 kg/m  Well developed and well nourished in no acute distress HENT normal E scleral and icterus clear Neck Supple JVP flat; carotids brisk and full Clear to ausculation Device pocket well healed; without hematoma or erythema.    Regular rate and rhythm, no murmurs gallops or rub Soft with active bowel sounds No clubbing cyanosis  Edema Alert and  oriented, grossly normal motor and sensory function Skin Warm and Dry    Assessment and  Plan  Splenic infarct  Atrial fibrillation-paroxysmal with a rapid rate  Sleep disordered breathing  Morbid obesity   She comes in with newly identified atrial fibrillation.  We discussed the use of the NOACs compared to Coumadin. We briefly reviewed the data of at least comparability in stroke prevention, bleeding and outcome. We discussed some of the new once wherein somewhat associated with decreased ischemic stroke risk, one to be taken daily, and has been shown to be comparable and bleeding risk to aspirin.  We also discussed bleeding associated with warfarin as well as NOACs and a wall bleeding as a complication of all these drugs intracranial bleeding is more frequently associated with warfarin then the NOACs and a GI bleeding is more commonly associated with the latter  We will stop ASA and begin apixoban  She needs a sleep study and will arrnage  I am concerned about the bleeding that she described from her LINQ site-- I have place a bandaid but am worried that the bleeding may have given a source of infection .   We spent more than 50% of our >25 min visit in face to face counseling regarding the above     Current medicines are reviewed at length with the patient today .  The patient does not have concerns regarding medicines.

## 2017-12-28 NOTE — Progress Notes (Signed)
[facility] Consultation      Assessment and Plan:  Excessive daytime sleepiness, snoring. - Symptoms and signs of obstructive sleep apnea, will send for sleep study.  Atrial fibrillation, essential hypertension, obesity. -Obstructive sleep apnea can contribute to above conditions, therefore treatment of the patient's sleep apnea is important part of their management.  Orders Placed This Encounter  Procedures  . Split night study    Standing Status:   Future    Standing Expiration Date:   12/28/2018    Order Specific Question:   Where should this test be performed:    Answer:   [facility] - Pulmonary   Return in about 2 months (around 02/27/2018).   Date: 12/28/2017  MRN# [id number] [name] Jul 23, 1951    [name] is a 67 y.o. old female seen in consultation for chief complaint of:    Chief Complaint  Patient presents with  . Consult    Referred by Dr. Klein:Sleep disordered breathing-eval for sleep study  . daytime sleepiness    pt says if she sits down to watch tv she will go to sleep. She does snore loudly.    HPI:   Patient is referred for restless sleep and snoring, as well as excessive daytime sleepiness.  Typically goes to bed between 10 and 11:30 PM.  Falls asleep within 30 minutes, wakes up to get out of bed between 8 and 8:30 AM.  Patient has gained personally 50 pounds over the last 2 years.  Epworth score today is 5. She was recently diagnosed with afib. She snores at night, and wakes herself up snoring, therefore she props herself up on 3 pillows.  No sleep walking. She falls asleep reading, and watching television. No cataplexy.  No jaw pain or TMJ. All 3 of her sons have OSA.    PMHX:   Past Medical History:  Diagnosis Date  . Arthritis   . CHF (congestive heart failure) (HCC)   . Essential hypertension 10/15/2015  . Heart murmur   . Hypertension   . Hypothyroidism 10/15/2015  . Transfusion history    '77 "pt has positive  antibodies history"   Surgical Hx:  Past Surgical History:  Procedure Laterality Date  . CHOLECYSTECTOMY    . DILATION AND CURETTAGE OF UTERUS    . LOOP RECORDER INSERTION N/A 12/21/2017   Procedure: LOOP RECORDER INSERTION;  Surgeon: Duke Salvia, MD;  Location: [facility] INVASIVE CV LAB;  Service: Cardiovascular;  Laterality: N/A;  . OMENTECTOMY N/A 10/12/2015   Procedure: PARTIAL OMENTECTOMY;  Surgeon: Violeta Gelinas, MD;  Location: [facility] OR;  Service: General;  Laterality: N/A;  . TOTAL KNEE ARTHROPLASTY Left 04/04/2016   Procedure: LEFT TOTAL KNEE ARTHROPLASTY;  Surgeon: Ollen Gross, MD;  Location: [facility] ORS;  Service: Orthopedics;  Laterality: Left;  . TOTAL KNEE ARTHROPLASTY Right 08/01/2016   Procedure: TOTAL KNEE ARTHROPLASTY;  Surgeon: Ollen Gross, MD;  Location: [facility] ORS;  Service: Orthopedics;  Laterality: Right;  . UMBILICAL HERNIA REPAIR N/A 10/12/2015   Procedure: HERNIA REPAIR UMBILICAL ADULT/INCARERATED;  Surgeon: Violeta Gelinas, MD;  Location: [facility] OR;  Service: General;  Laterality: N/A;   Family Hx:  Family History  Problem Relation Age of Onset  . Rheum arthritis Mother   . Heart attack Father   . Heart disease Father    Social Hx:   Social History   Tobacco Use  . Smoking status: Former Smoker    Last attempt to quit: 03/29/1987    Years since quitting:  30.7  . Smokeless tobacco: Never Used  Substance Use Topics  . Alcohol use: No  . Drug use: No   Medication:    Current Outpatient Medications:  .  acetaminophen (TYLENOL) 650 MG CR tablet, Take 1,300 mg by mouth at bedtime., Disp: , Rfl:  .  apixaban (ELIQUIS) 5 MG TABS tablet, Take 1 tablet (5 mg total) by mouth 2 (two) times daily., Disp: 60 tablet, Rfl: 11 .  Calcium Carbonate (CALCIUM 600 PO), Take 1 tablet by mouth daily., Disp: , Rfl:  .  furosemide (LASIX) 20 MG tablet, Take 40 mg by mouth every morning. , Disp: , Rfl:  .  levothyroxine (SYNTHROID, LEVOTHROID) 75 MCG tablet, Take 75 mcg by mouth daily before  breakfast. , Disp: , Rfl: 2 .  metoprolol (LOPRESSOR) 100 MG tablet, Take 100 mg by mouth 2 (two) times daily. , Disp: , Rfl:  .  Vitamin D, Cholecalciferol, 1000 units TABS, Take 2,000 Units by mouth daily., Disp: , Rfl:    Allergies:  Augmentin [amoxicillin-pot clavulanate] and Pneumococcal vaccines  Review of Systems: Gen:  Denies  fever, sweats, chills HEENT: Denies blurred vision, double vision. bleeds, sore throat Cvc:  No dizziness, chest pain. Resp:   Denies cough or sputum production, shortness of breath Gi: Denies swallowing difficulty, stomach pain. Gu:  Denies bladder incontinence, burning urine Ext:   No Joint pain, stiffness. Skin: No skin rash,  hives  Endoc:  No polyuria, polydipsia. Psych: No depression, insomnia. Other:  All other systems were reviewed with the patient and were negative other that what is mentioned in the HPI.   Physical Examination:   VS: BP 110/68 (BP Location: Left Arm, Cuff Size: Large)   Pulse 64   Resp 16   Ht  (1.702 m)   Wt 271 lb (122.9 kg)   SpO2 95%   BMI 42.44 kg/m   General Appearance: No distress  Neuro:without focal findings,  speech normal,  HEENT: PERRLA, EOM intact.  Mallampati 3 Pulmonary: normal breath sounds, No wheezing.  CardiovascularNormal S1,S2.  No m/r/g.   Abdomen: Benign, Soft, non-tender. Renal:  No costovertebral tenderness  GU:  No performed at this time. Endoc: No evident thyromegaly, no signs of acromegaly. Skin:   warm, no rashes, no ecchymosis  Extremities: normal, no cyanosis, clubbing.  Other findings:    LABORATORY PANEL:   CBC No results for input(s): WBC, HGB, HCT, PLT in the last 168 hours. ------------------------------------------------------------------------------------------------------------------  Chemistries  No results for input(s): NA, K, CL, CO2, GLUCOSE, BUN, CREATININE, CALCIUM, MG, AST, ALT, ALKPHOS, BILITOT in the last 168 hours.  Invalid input(s):  GFRCGP ------------------------------------------------------------------------------------------------------------------  Cardiac Enzymes No results for input(s): TROPONINI in the last 168 hours. ------------------------------------------------------------  RADIOLOGY:  No results found.     Thank  you for the consultation and for allowing [facility] to assist in the care of your patient. Our recommendations are noted above.  Please contact us if we can be of further service.   Wells Guiles, MD.  Board Certified in Internal Medicine, Pulmonary Medicine, Critical Care Medicine, and Sleep Medicine.   Pulmonary and Critical Care Office Number: [phone]  Santiago Glad, M.D.  Billy Fischer, M.D  12/28/2017

## 2017-12-28 NOTE — Patient Instructions (Addendum)
Will send you for a sleep study.     Sleep Apnea      Sleep apnea is disorder that affects a person's sleep. A person with sleep apnea has abnormal pauses in their breathing when they sleep. It is hard for them to get a good sleep. This makes a person tired during the day. It also can lead to other physical problems. There are three types of sleep apnea. One type is when breathing stops for a short time because your airway is blocked (obstructive sleep apnea). Another type is when the brain sometimes fails to give the normal signal to breathe to the muscles that control your breathing (central sleep apnea). The third type is a combination of the other two types. HOME CARE   Take all medicine as told by your doctor.  Avoid alcohol, calming medicines (sedatives), and depressant drugs.  Try to lose weight if you are overweight. Talk to your doctor about a healthy weight goal.  Your doctor may have you use a device that helps to open your airway. It can help you get the air that you need. It is called a positive airway pressure (PAP) device.   MAKE SURE YOU:   Understand these instructions.  Will watch your condition.  Will get help right away if you are not doing well or get worse.  It may take approximately 1 month for you to get used to wearing her CPAP every night.  Be sure to work with your machine to get used to it, be patient, it may take time! 

## 2018-01-08 ENCOUNTER — Ambulatory Visit (INDEPENDENT_AMBULATORY_CARE_PROVIDER_SITE_OTHER): Payer: Self-pay | Admitting: *Deleted

## 2018-01-08 DIAGNOSIS — I639 Cerebral infarction, unspecified: Secondary | ICD-10-CM

## 2018-01-08 LAB — CUP PACEART INCLINIC DEVICE CHECK
Date Time Interrogation Session: 20190520104838
MDC IDC PG IMPLANT DT: 20190502

## 2018-01-08 NOTE — Progress Notes (Signed)
Wound check in clinic s/p ILR implant. Steri strips removed prior to appt. Wound well healed without redness or edema. Incision edges approximated. Normal ILR device function. Battery status: GOOD. R-waves 1.25mV. 0 symptom episodes, 0 tachy episodes, 0 pause episodes, 0 brady episodes. (3) AF episodes (1.2% burden) - all on 5/7, + Eliquis. Patient education completed including wound care and remote monitoring. Monthly summary reports and ROV with SK in 3 months.

## 2018-01-10 ENCOUNTER — Ambulatory Visit: Payer: Medicare Other | Attending: Internal Medicine

## 2018-01-10 DIAGNOSIS — G4710 Hypersomnia, unspecified: Secondary | ICD-10-CM | POA: Diagnosis not present

## 2018-01-10 DIAGNOSIS — R0683 Snoring: Secondary | ICD-10-CM | POA: Insufficient documentation

## 2018-01-12 DIAGNOSIS — R0683 Snoring: Secondary | ICD-10-CM | POA: Diagnosis not present

## 2018-01-16 ENCOUNTER — Telehealth: Payer: Self-pay | Admitting: *Deleted

## 2018-01-16 NOTE — Telephone Encounter (Signed)
Pt aware negative for sleep apnea. Nothing further needed at time.

## 2018-01-22 ENCOUNTER — Ambulatory Visit (INDEPENDENT_AMBULATORY_CARE_PROVIDER_SITE_OTHER): Payer: Medicare Other | Admitting: *Deleted

## 2018-01-22 DIAGNOSIS — I639 Cerebral infarction, unspecified: Secondary | ICD-10-CM

## 2018-01-22 NOTE — Progress Notes (Signed)
Carelink Summary Report / Loop Recorder 

## 2018-02-07 ENCOUNTER — Ambulatory Visit: Payer: Medicare Other | Admitting: Cardiovascular Disease

## 2018-02-09 ENCOUNTER — Telehealth: Payer: Self-pay | Admitting: Internal Medicine

## 2018-02-09 NOTE — Telephone Encounter (Signed)
Call received from [employer] earlier this week that the application needed to have the RX frequency specified in section 2 of the applciation.  I advised the rep from [employer] that there was not a place on the application for this and she advised she thought that was an error on their part on the application.  I addended the application to state that multaq 400 mg is 1 tab po BID and faxed to [employer] at [phone]. Confirmation received.

## 2018-02-13 ENCOUNTER — Telehealth: Payer: Self-pay | Admitting: Internal Medicine

## 2018-02-13 MED ORDER — APIXABAN 5 MG PO TABS: 5.0000 mg | ORAL_TABLET | Freq: Two times a day (BID) | ORAL | 3 refills | Status: DC

## 2018-02-13 NOTE — Telephone Encounter (Signed)
Pt calling asking for an update she states she faxed over Financial assistance for Eliquis  She is calling to get an update on if we received it and where we are in processing this  Please call back

## 2018-02-13 NOTE — Telephone Encounter (Signed)
I spoke with the patient and advised her Dr. Graciela HusbandsKlein has signed her patient assistance form.  However, I printed off the 11/2017 version and had him complete this (she originally sent the 01/2017 version).  I attempted to fax this back to her unsuccessfully x 2 attempts. Per the patient, she will come pick up the forms.  Forms placed at the front desk for pick up with an Eliquis RX to be submitted with the form.

## 2018-02-16 ENCOUNTER — Telehealth: Payer: Self-pay | Admitting: Internal Medicine

## 2018-02-16 NOTE — Telephone Encounter (Signed)
Fax received from [employer] patient assistance foundation that the patient's application was received for Eliquis and will be reviewed. We should be notified within 2 business days of a determination.

## 2018-02-27 NOTE — Telephone Encounter (Signed)
I called and spoke with the patient and advised her that I had received a fax from [employer] [facility] dated 02/20/18 that she did not qualify for patient assistance due to: "product covered by insurance."  Per the patient, the company called her on 02/21/18 and advised her she did not have enough out of pocket expense on her medications yet. The patient states she advised the company that she needed to get her RX filled and was going to have to pay $425 out of pocket. The company advised her to fax them a copy of the receipt and that she should qualify after that. She states she sent the receipt and should know something by Friday. I advised her if they still do not approve her to call and let me know.   She voices understanding and was appreciative for the call back.

## 2018-02-28 ENCOUNTER — Telehealth: Payer: Self-pay | Admitting: Cardiology

## 2018-02-28 LAB — CUP PACEART REMOTE DEVICE CHECK
Date Time Interrogation Session: 20190601134051
MDC IDC PG IMPLANT DT: 20190502

## 2018-02-28 NOTE — Telephone Encounter (Signed)
Spoke w/ pt and requested that she send a manual transmission b/c her home monitor has not updated in at least 14 days.   

## 2018-03-08 ENCOUNTER — Other Ambulatory Visit: Payer: Self-pay | Admitting: Internal Medicine

## 2018-03-15 ENCOUNTER — Encounter: Payer: Self-pay | Admitting: Internal Medicine

## 2018-03-15 NOTE — Progress Notes (Signed)
Fax received from [facility] patient assistance stating that the patient now qualifies for patient assistance for Eliquis from 03/05/18-08/21/18.

## 2018-03-20 ENCOUNTER — Telehealth: Payer: Self-pay | Admitting: Internal Medicine

## 2018-03-20 NOTE — Telephone Encounter (Signed)
Ok to give 1 weeks worth of Eliquis samples.   Thanks!

## 2018-03-20 NOTE — Telephone Encounter (Signed)
Patient calling the office for samples of medication:   1.  What medication and dosage are you requesting samples for? Eliquis 5 mg po BID  2.  Are you currently out of this medication? Yes need 1 week to wait on rx to be mailed

## 2018-03-20 NOTE — Telephone Encounter (Signed)
Please advise if you would like me to provide samples.

## 2018-03-21 NOTE — Telephone Encounter (Signed)
Spoke with patient.  Provided patient with samples.  LOT number [id number] Expiration date: June 2021  Patient states she has contacted the patient assistant company and she has been approved but noone has contacted her about shipment. She states she has reached out to the company multiple times but they have not received an order to ship it out.   Told her to make sure she does not run out of this medication and if she needs more to contact the office and we will help her if we can until they can get everything straight.

## 2018-04-03 ENCOUNTER — Ambulatory Visit (INDEPENDENT_AMBULATORY_CARE_PROVIDER_SITE_OTHER): Payer: Medicare Other | Admitting: Internal Medicine

## 2018-04-03 ENCOUNTER — Encounter: Payer: Self-pay | Admitting: Internal Medicine

## 2018-04-03 VITALS — BP 164/80 | HR 52 | Ht 67.0 in | Wt 286.2 lb

## 2018-04-03 DIAGNOSIS — I480 Paroxysmal atrial fibrillation: Secondary | ICD-10-CM | POA: Diagnosis not present

## 2018-04-03 DIAGNOSIS — I639 Cerebral infarction, unspecified: Secondary | ICD-10-CM

## 2018-04-03 DIAGNOSIS — Z959 Presence of cardiac and vascular implant and graft, unspecified: Secondary | ICD-10-CM

## 2018-04-03 DIAGNOSIS — D735 Infarction of spleen: Secondary | ICD-10-CM | POA: Diagnosis not present

## 2018-04-03 NOTE — Patient Instructions (Signed)
Medication Instructions: - Your physician recommends that you continue on your current medications as directed. Please refer to the Current Medication list given to you today.  Labwork: - none ordered  Procedures/Testing: - none ordered  Follow-Up: - as needed  Any Additional Special Instructions Will Be Listed Below (If Applicable).     If you need a refill on your cardiac medications before your next appointment, please call your pharmacy.   

## 2018-04-03 NOTE — Progress Notes (Signed)
Patient Care Team: Merri BrunettePharr, Walter, MD as PCP - General (Internal Medicine)   HPI  [name] is a 67 y.o. female Seen in follow-up for splenic infarct.  She is one-week status post LINQ insertion.  She had palpitations the night before last and device interrogation demonstrated atrial fibrillation she has been started on anticoagulation and is on [employer] patient support   Retrospectively, she has noted these episodes of irregular tachypalpitations.  They have occurred every few months and last typically about an hour.  They are disruptive and with some shortness of breath.  She has sleep disordered breathing.  She sleeps sitting up because of orthopnea and awakening at night   she underwent sleep testing.  This was negative.  She remains depressed following the loss of her husband a year ago.  She is eating a lot and gaining weight  220>>285 / [age]yrs     Records and Results Reviewed loop recorder strips  Past Medical History:  Diagnosis Date  . Arthritis   . CHF (congestive heart failure) (HCC)   . Essential hypertension 10/15/2015  . Heart murmur   . Hypertension   . Hypothyroidism 10/15/2015  . 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"    Past Surgical History:  Procedure Laterality Date  . CHOLECYSTECTOMY    . DILATION AND CURETTAGE OF UTERUS    . LOOP RECORDER INSERTION N/A 12/21/2017   Procedure: LOOP RECORDER INSERTION;  Surgeon: Duke SalviaKlein, Steven C, MD;  [facility];  Service: Cardiovascular;  Laterality: N/A;  . OMENTECTOMY N/A 10/12/2015   Procedure: PARTIAL OMENTECTOMY;  Surgeon: Violeta GelinasBurke Thompson, MD;  Location: [facility] OR;  Service: General;  Laterality: N/A;  . TOTAL KNEE ARTHROPLASTY Left 04/04/2016   Procedure: LEFT TOTAL KNEE ARTHROPLASTY;  Surgeon: Ollen GrossFrank Aluisio, MD;  Location: [facility] ORS;  Service: Orthopedics;  Laterality: Left;  . TOTAL KNEE ARTHROPLASTY Right 08/01/2016   Procedure: TOTAL KNEE ARTHROPLASTY;  Surgeon:  Ollen GrossFrank Aluisio, MD;  Location: [facility] ORS;  Service: Orthopedics;  Laterality: Right;  . UMBILICAL HERNIA REPAIR N/A 10/12/2015   Procedure: HERNIA REPAIR UMBILICAL ADULT/INCARERATED;  Surgeon: Violeta GelinasBurke Thompson, MD;  Location: [facility] OR;  Service: General;  Laterality: N/A;    Current Meds  Medication Sig  . acetaminophen (TYLENOL) 650 MG CR tablet Take 500 mg by mouth at bedtime.   Marland Kitchen. apixaban (ELIQUIS) 5 MG TABS tablet Take 1 tablet (5 mg total) by mouth 2 (two) times daily.  . Calcium Carbonate (CALCIUM 600 PO) Take 1 tablet by mouth daily.  . furosemide (LASIX) 20 MG tablet Take 40 mg by mouth every morning.   Marland Kitchen. levothyroxine (SYNTHROID, LEVOTHROID) 75 MCG tablet Take 75 mcg by mouth daily before breakfast.   . metoprolol (LOPRESSOR) 100 MG tablet Take 100 mg by mouth 2 (two) times daily.   . Vitamin D, Cholecalciferol, 1000 units TABS Take 2,000 Units by mouth daily.    Allergies  Allergen Reactions  . Augmentin [Amoxicillin-Pot Clavulanate] Hives and Itching    Has patient had a PCN reaction causing immediate rash, facial/tongue/throat swelling, SOB or lightheadedness with hypotension:No Has patient had a PCN reaction causing severe rash involving mucus membranes or skin necrosis:No Has patient had a PCN reaction that required hospitalization:No Has patient had a PCN reaction occurring within the last 10 years:No If all of the above answers are "NO", then may proceed with Cephalosporin use.   . Pneumococcal Vaccines Other (See Comments)    Caused  fever, and swelling at injection site      Review of Systems negative except from HPI and PMH  Physical Exam BP (!) 164/80 (BP Location: Left Arm, Patient Position: Sitting, Cuff Size: Large)   Pulse (!) 52   Ht 5\' 7"  (1.702 m)   Wt 286 lb 4 oz (129.8 kg)   BMI 44.83 kg/m  Well developed and Morbidly obese no acute distress HENT normal Neck supple   Clear Regular rate and rhythm, no murmurs or gallops Abd-soft with active BS No  Clubbing cyanosis edema Skin-warm and dry A & Oriented  Grossly normal sensory and motor function   ECG sinus at 52 Intervals 16/09/45 Axis left -39  Assessment and  Plan  Splenic infarct  Atrial fibrillation-paroxysmal with a rapid rate  Sleep disordered breathing  Morbid obesity \ Vertigo  Depression    Continue anticoagulation  No bleeding issues  Have encouraged her to discuss vertigo and depression with Dr. Renne CriglerPharr.  She is still grieving over the loss of her husband.  He will follow her CBC to 6 months.  We will see her as needed.  encouraged her to work on weight loss     We spent more than 50% of our >25 min visit in face to face counseling regarding the above     Current medicines are reviewed at length with the patient today .  The patient does not have concerns regarding medicines.

## 2018-04-04 DIAGNOSIS — I10 Essential (primary) hypertension: Secondary | ICD-10-CM | POA: Diagnosis not present

## 2018-04-04 DIAGNOSIS — E039 Hypothyroidism, unspecified: Secondary | ICD-10-CM | POA: Diagnosis not present

## 2018-04-04 DIAGNOSIS — E559 Vitamin D deficiency, unspecified: Secondary | ICD-10-CM | POA: Diagnosis not present

## 2018-04-11 ENCOUNTER — Telehealth: Payer: Self-pay | Admitting: Internal Medicine

## 2018-04-11 DIAGNOSIS — Z0000 Encounter for general adult medical examination without abnormal findings: Secondary | ICD-10-CM | POA: Diagnosis not present

## 2018-04-11 DIAGNOSIS — F329 Major depressive disorder, single episode, unspecified: Secondary | ICD-10-CM | POA: Diagnosis not present

## 2018-04-11 DIAGNOSIS — E559 Vitamin D deficiency, unspecified: Secondary | ICD-10-CM | POA: Diagnosis not present

## 2018-04-11 DIAGNOSIS — E039 Hypothyroidism, unspecified: Secondary | ICD-10-CM | POA: Diagnosis not present

## 2018-04-11 DIAGNOSIS — I10 Essential (primary) hypertension: Secondary | ICD-10-CM | POA: Diagnosis not present

## 2018-04-11 DIAGNOSIS — Z7901 Long term (current) use of anticoagulants: Secondary | ICD-10-CM | POA: Diagnosis not present

## 2018-04-11 NOTE — Telephone Encounter (Signed)
Routing to pharmacy. 

## 2018-04-11 NOTE — Telephone Encounter (Signed)
Pt would like to know if it is ok for her to take Sertraline 50 mg once a day with her Eliquis. Please call to discuss

## 2018-04-11 NOTE — Telephone Encounter (Signed)
Per Prudence DavidsonKelley Auten, PharmD you can take Sertraline 50mg  QD with Eliquis.  Concern is increased bleeding risk given Sertraline's platelet effects, but ok to take.  Monitor for increased bleeding.

## 2018-04-12 NOTE — Telephone Encounter (Signed)
Called patient and she verbalized understanding that ok to take and of recommendations.  It was prescribed by Primary Care office, Lauretta ChesterMary Prevost, NP. Med list updated.

## 2018-04-25 DIAGNOSIS — Z23 Encounter for immunization: Secondary | ICD-10-CM | POA: Diagnosis not present

## 2018-04-25 DIAGNOSIS — F418 Other specified anxiety disorders: Secondary | ICD-10-CM | POA: Diagnosis not present

## 2018-04-30 ENCOUNTER — Ambulatory Visit (INDEPENDENT_AMBULATORY_CARE_PROVIDER_SITE_OTHER): Payer: Medicare Other | Admitting: *Deleted

## 2018-04-30 DIAGNOSIS — I639 Cerebral infarction, unspecified: Secondary | ICD-10-CM

## 2018-04-30 NOTE — Progress Notes (Signed)
[facility] Medicine Consultation      Assessment and Plan:  Excessive daytime sleepiness, snoring. - Symptoms and signs of obstructive sleep apnea, however in lab sleep study was negative. - Sleep study showed significant REM suppression with total REM time of only 10 minutes, pt was not on antidepressants at time of test.  I suspect that this is due to fragmented sleep secondary to sleep lab effect. - Metoprolol could also be contributing to daytime sleepiness. - Recommend follow-up if symptoms progress, if snoring worsens, or patient gains a significant amount of weight, the patient can be retested in 1 to 2 years time.   Date: 04/30/2018  MRN# [id number] [name] 13-Apr-1951    [name] is a 67 y.o. old female seen in consultation for chief complaint of:    Chief Complaint  Patient presents with  . Follow-up    pt had a sleep study that was negative. She is here for f/u. Pt states she wakes up many times during the night.    HPI:   Patient is referred for restless sleep and snoring, as well as excessive daytime sleepiness.   She was recently diagnosed with afib. She snores at night, and wakes herself up snoring, therefore she props herself up on 3 pillows.  She returns today as a follow up for an in lab sleep study which was negative.  No sleep walking. She falls asleep reading, and watching television. No cataplexy.  No jaw pain or TMJ. All 3 of her sons have OSA.   **Baseline sleep study 01/10/2018>> AHI 3.1, negative for sleep apnea, she was recommended to avoid sleeping supine position.  Medication:    Current Outpatient Medications:  .  acetaminophen (TYLENOL) 650 MG CR tablet, Take 500 mg by mouth at bedtime. , Disp: , Rfl:  .  apixaban (ELIQUIS) 5 MG TABS tablet, Take 1 tablet (5 mg total) by mouth 2 (two) times daily., Disp: 180 tablet, Rfl: 3 .  Calcium Carbonate (CALCIUM 600 PO), Take 1 tablet by mouth daily., Disp: , Rfl:  .   furosemide (LASIX) 20 MG tablet, Take 40 mg by mouth every morning. , Disp: , Rfl:  .  levothyroxine (SYNTHROID, LEVOTHROID) 75 MCG tablet, Take 75 mcg by mouth daily before breakfast. , Disp: , Rfl: 2 .  metoprolol (LOPRESSOR) 100 MG tablet, Take 100 mg by mouth 2 (two) times daily. , Disp: , Rfl:  .  sertraline (ZOLOFT) 50 MG tablet, Take 50 mg by mouth daily., Disp: , Rfl:  .  Vitamin D, Cholecalciferol, 1000 units TABS, Take 2,000 Units by mouth daily., Disp: , Rfl:    Allergies:  Augmentin [amoxicillin-pot clavulanate] and Pneumococcal vaccines  Review of Systems:  Constitutional: Feels well. Cardiovascular: No chest pain.  Pulmonary: Denies dyspnea.   The remainder of systems were reviewed and were found to be negative other than what is documented in the HPI.    Physical Examination:   VS: BP 100/68 (BP Location: Left Arm, Cuff Size: Large)   Pulse 67   Resp 16   Ht 5\' 7"  (1.702 m)   Wt 290 lb (131.5 kg)   SpO2 96%   BMI 45.42 kg/m   General Appearance: No distress  Neuro:without focal findings, mental status, speech normal, alert and oriented HEENT: PERRLA, EOM intact Pulmonary: No wheezing, No rales  CardiovascularNormal S1,S2.  No m/r/g.  Abdomen: Benign, Soft, non-tender, No masses Renal:  No costovertebral tenderness  GU:  No performed at this  time. Endoc: No evident thyromegaly, no signs of acromegaly or Cushing features Skin:   warm, no rashes, no ecchymosis  Extremities: normal, no cyanosis, clubbing.      LABORATORY PANEL:   CBC No results for input(s): WBC, HGB, HCT, PLT in the last 168 hours. ------------------------------------------------------------------------------------------------------------------  Chemistries  No results for input(s): NA, K, CL, CO2, GLUCOSE, BUN, CREATININE, CALCIUM, MG, AST, ALT, ALKPHOS, BILITOT in the last 168 hours.  Invalid input(s):  GFRCGP ------------------------------------------------------------------------------------------------------------------  Cardiac Enzymes No results for input(s): TROPONINI in the last 168 hours. ------------------------------------------------------------  RADIOLOGY:  No results found.     Thank  you for the consultation and for allowing [facility] to assist in the care of your patient. Our recommendations are noted above.  Please contact us if we can be of further service.  Wells Guiles, M.D., F.C.C.P.  Board Certified in Internal Medicine, Pulmonary Medicine, Critical Care Medicine, and Sleep Medicine.  [place] [facility] Office Number: [phone]  04/30/2018

## 2018-04-30 NOTE — Progress Notes (Signed)
Carelink Summary Report / Loop Recorder 

## 2018-05-01 ENCOUNTER — Telehealth: Payer: Self-pay | Admitting: Internal Medicine

## 2018-05-01 ENCOUNTER — Ambulatory Visit (INDEPENDENT_AMBULATORY_CARE_PROVIDER_SITE_OTHER): Payer: Medicare Other | Admitting: Internal Medicine

## 2018-05-01 ENCOUNTER — Encounter: Payer: Self-pay | Admitting: Internal Medicine

## 2018-05-01 VITALS — BP 100/68 | HR 67 | Resp 16 | Ht 67.0 in | Wt 290.0 lb

## 2018-05-01 DIAGNOSIS — G4719 Other hypersomnia: Secondary | ICD-10-CM

## 2018-05-01 DIAGNOSIS — I639 Cerebral infarction, unspecified: Secondary | ICD-10-CM | POA: Diagnosis not present

## 2018-05-01 NOTE — Telephone Encounter (Signed)
Patient calling  States that her loop recorder has not transmitted since Aug. 26th, was told to contact if it has not transmitted over a week Please call to discuss

## 2018-05-01 NOTE — Patient Instructions (Signed)
Sleep study negative, follow up as needed.

## 2018-05-01 NOTE — Telephone Encounter (Signed)
Spoke with pt and walked her through a sending a manual transmission. Transmission successful.

## 2018-05-15 DIAGNOSIS — Z1211 Encounter for screening for malignant neoplasm of colon: Secondary | ICD-10-CM | POA: Diagnosis not present

## 2018-05-15 DIAGNOSIS — Z1212 Encounter for screening for malignant neoplasm of rectum: Secondary | ICD-10-CM | POA: Diagnosis not present

## 2018-05-16 LAB — CUP PACEART INCLINIC DEVICE CHECK
Implantable Pulse Generator Implant Date: 20190502
MDC IDC SESS DTM: 20190813153244

## 2018-05-17 ENCOUNTER — Telehealth: Payer: Self-pay

## 2018-05-17 NOTE — Telephone Encounter (Signed)
Spoke w/ pt and requested that she send a manual transmission b/c her home monitor has not updated in at least 14 days.   

## 2018-05-18 LAB — CUP PACEART REMOTE DEVICE CHECK
Date Time Interrogation Session: 20190908144044
Implantable Pulse Generator Implant Date: 20190502

## 2018-06-01 ENCOUNTER — Ambulatory Visit (INDEPENDENT_AMBULATORY_CARE_PROVIDER_SITE_OTHER): Payer: Medicare Other | Admitting: *Deleted

## 2018-06-01 DIAGNOSIS — I639 Cerebral infarction, unspecified: Secondary | ICD-10-CM

## 2018-06-01 NOTE — Progress Notes (Signed)
Carelink Summary Report / Loop Recorder 

## 2018-06-18 LAB — CUP PACEART REMOTE DEVICE CHECK
Date Time Interrogation Session: 20191011143533
MDC IDC PG IMPLANT DT: 20190502

## 2018-07-04 ENCOUNTER — Ambulatory Visit (INDEPENDENT_AMBULATORY_CARE_PROVIDER_SITE_OTHER): Payer: Medicare Other | Admitting: *Deleted

## 2018-07-04 DIAGNOSIS — I639 Cerebral infarction, unspecified: Secondary | ICD-10-CM | POA: Diagnosis not present

## 2018-07-04 DIAGNOSIS — I10 Essential (primary) hypertension: Secondary | ICD-10-CM

## 2018-07-04 NOTE — Progress Notes (Signed)
Carelink Summary Report / Loop Recorder 

## 2018-08-06 ENCOUNTER — Ambulatory Visit (INDEPENDENT_AMBULATORY_CARE_PROVIDER_SITE_OTHER): Payer: Medicare Other

## 2018-08-06 DIAGNOSIS — I639 Cerebral infarction, unspecified: Secondary | ICD-10-CM

## 2018-08-07 NOTE — Progress Notes (Signed)
Carelink Summary Report / Loop Recorder 

## 2018-08-23 LAB — CUP PACEART REMOTE DEVICE CHECK
Date Time Interrogation Session: 20191113154116
MDC IDC PG IMPLANT DT: 20190502

## 2018-09-09 LAB — CUP PACEART REMOTE DEVICE CHECK
Implantable Pulse Generator Implant Date: 20190502
MDC IDC SESS DTM: 20191216174042

## 2018-09-10 ENCOUNTER — Ambulatory Visit (INDEPENDENT_AMBULATORY_CARE_PROVIDER_SITE_OTHER): Payer: PPO

## 2018-09-10 DIAGNOSIS — I639 Cerebral infarction, unspecified: Secondary | ICD-10-CM

## 2018-09-11 LAB — CUP PACEART REMOTE DEVICE CHECK
Date Time Interrogation Session: 20200118184022
Implantable Pulse Generator Implant Date: 20190502

## 2018-09-11 NOTE — Progress Notes (Signed)
Carelink Summary Report / Loop Recorder 

## 2018-10-11 ENCOUNTER — Ambulatory Visit (INDEPENDENT_AMBULATORY_CARE_PROVIDER_SITE_OTHER): Payer: PPO

## 2018-10-11 DIAGNOSIS — I639 Cerebral infarction, unspecified: Secondary | ICD-10-CM | POA: Diagnosis not present

## 2018-10-11 DIAGNOSIS — I10 Essential (primary) hypertension: Secondary | ICD-10-CM | POA: Diagnosis not present

## 2018-10-13 LAB — CUP PACEART REMOTE DEVICE CHECK
Date Time Interrogation Session: 20200220194053
Implantable Pulse Generator Implant Date: 20190502

## 2018-10-18 DIAGNOSIS — F329 Major depressive disorder, single episode, unspecified: Secondary | ICD-10-CM | POA: Diagnosis not present

## 2018-10-18 DIAGNOSIS — E559 Vitamin D deficiency, unspecified: Secondary | ICD-10-CM | POA: Diagnosis not present

## 2018-10-18 DIAGNOSIS — I129 Hypertensive chronic kidney disease with stage 1 through stage 4 chronic kidney disease, or unspecified chronic kidney disease: Secondary | ICD-10-CM | POA: Diagnosis not present

## 2018-10-18 DIAGNOSIS — E039 Hypothyroidism, unspecified: Secondary | ICD-10-CM | POA: Diagnosis not present

## 2018-10-18 NOTE — Progress Notes (Signed)
Carelink Summary Report / Loop Recorder 

## 2018-11-13 ENCOUNTER — Other Ambulatory Visit: Payer: Self-pay

## 2018-11-13 ENCOUNTER — Ambulatory Visit (INDEPENDENT_AMBULATORY_CARE_PROVIDER_SITE_OTHER): Payer: PPO | Admitting: *Deleted

## 2018-11-13 DIAGNOSIS — I639 Cerebral infarction, unspecified: Secondary | ICD-10-CM

## 2018-11-17 LAB — CUP PACEART REMOTE DEVICE CHECK
Date Time Interrogation Session: 20200324201019
Implantable Pulse Generator Implant Date: 20190502

## 2018-11-19 NOTE — Progress Notes (Signed)
Carelink Summary Report / Loop Recorder 

## 2018-12-14 DIAGNOSIS — H1013 Acute atopic conjunctivitis, bilateral: Secondary | ICD-10-CM | POA: Diagnosis not present

## 2018-12-17 ENCOUNTER — Ambulatory Visit (INDEPENDENT_AMBULATORY_CARE_PROVIDER_SITE_OTHER): Payer: PPO | Admitting: *Deleted

## 2018-12-17 ENCOUNTER — Other Ambulatory Visit: Payer: Self-pay

## 2018-12-17 DIAGNOSIS — I639 Cerebral infarction, unspecified: Secondary | ICD-10-CM

## 2018-12-17 DIAGNOSIS — R001 Bradycardia, unspecified: Secondary | ICD-10-CM

## 2018-12-17 LAB — CUP PACEART REMOTE DEVICE CHECK
Date Time Interrogation Session: 20200426204056
Implantable Pulse Generator Implant Date: 20190502

## 2018-12-25 NOTE — Progress Notes (Signed)
Carelink Summary Report / Loop Recorder 

## 2019-01-18 ENCOUNTER — Other Ambulatory Visit: Payer: Self-pay | Admitting: Internal Medicine

## 2019-01-18 ENCOUNTER — Ambulatory Visit (INDEPENDENT_AMBULATORY_CARE_PROVIDER_SITE_OTHER): Payer: PPO | Admitting: *Deleted

## 2019-01-18 DIAGNOSIS — I639 Cerebral infarction, unspecified: Secondary | ICD-10-CM | POA: Diagnosis not present

## 2019-01-18 NOTE — Telephone Encounter (Signed)
Pt last saw Dr Graciela Husbands 04/03/18, last labs 10/11/18 Creat 0.82, age 68, weight 131.5kg, based on specified criteria pt is on appropriate dosage of Eliquis 5mg  BID.  Will refill rx.

## 2019-01-18 NOTE — Telephone Encounter (Signed)
This is a [name] pt 

## 2019-01-19 LAB — CUP PACEART REMOTE DEVICE CHECK
Date Time Interrogation Session: 20200529214150
Implantable Pulse Generator Implant Date: 20190502

## 2019-01-23 NOTE — Progress Notes (Signed)
Carelink Summary Report / Loop Recorder 

## 2019-02-20 ENCOUNTER — Ambulatory Visit (INDEPENDENT_AMBULATORY_CARE_PROVIDER_SITE_OTHER): Payer: PPO | Admitting: *Deleted

## 2019-02-20 DIAGNOSIS — I639 Cerebral infarction, unspecified: Secondary | ICD-10-CM

## 2019-02-21 LAB — CUP PACEART REMOTE DEVICE CHECK
Date Time Interrogation Session: 20200701220857
Implantable Pulse Generator Implant Date: 20190502

## 2019-03-01 NOTE — Progress Notes (Signed)
Carelink Summary Report / Loop Recorder 

## 2019-03-22 ENCOUNTER — Other Ambulatory Visit: Payer: Self-pay

## 2019-03-25 ENCOUNTER — Ambulatory Visit (INDEPENDENT_AMBULATORY_CARE_PROVIDER_SITE_OTHER): Payer: PPO | Admitting: *Deleted

## 2019-03-25 DIAGNOSIS — I639 Cerebral infarction, unspecified: Secondary | ICD-10-CM

## 2019-03-26 LAB — CUP PACEART REMOTE DEVICE CHECK
Date Time Interrogation Session: 20200803221011
Implantable Pulse Generator Implant Date: 20190502

## 2019-04-03 NOTE — Progress Notes (Signed)
Carelink Summary Report / Loop Recorder 

## 2019-04-12 DIAGNOSIS — M8580 Other specified disorders of bone density and structure, unspecified site: Secondary | ICD-10-CM | POA: Diagnosis not present

## 2019-04-12 DIAGNOSIS — I10 Essential (primary) hypertension: Secondary | ICD-10-CM | POA: Diagnosis not present

## 2019-04-12 DIAGNOSIS — E039 Hypothyroidism, unspecified: Secondary | ICD-10-CM | POA: Diagnosis not present

## 2019-04-17 DIAGNOSIS — Z7901 Long term (current) use of anticoagulants: Secondary | ICD-10-CM | POA: Diagnosis not present

## 2019-04-17 DIAGNOSIS — E039 Hypothyroidism, unspecified: Secondary | ICD-10-CM | POA: Diagnosis not present

## 2019-04-17 DIAGNOSIS — I10 Essential (primary) hypertension: Secondary | ICD-10-CM | POA: Diagnosis not present

## 2019-04-17 DIAGNOSIS — Z0000 Encounter for general adult medical examination without abnormal findings: Secondary | ICD-10-CM | POA: Diagnosis not present

## 2019-04-17 DIAGNOSIS — I480 Paroxysmal atrial fibrillation: Secondary | ICD-10-CM | POA: Diagnosis not present

## 2019-04-17 DIAGNOSIS — Z23 Encounter for immunization: Secondary | ICD-10-CM | POA: Diagnosis not present

## 2019-04-17 DIAGNOSIS — E559 Vitamin D deficiency, unspecified: Secondary | ICD-10-CM | POA: Diagnosis not present

## 2019-04-30 ENCOUNTER — Ambulatory Visit (INDEPENDENT_AMBULATORY_CARE_PROVIDER_SITE_OTHER): Payer: PPO | Admitting: *Deleted

## 2019-04-30 DIAGNOSIS — I639 Cerebral infarction, unspecified: Secondary | ICD-10-CM

## 2019-05-01 LAB — CUP PACEART REMOTE DEVICE CHECK
Date Time Interrogation Session: 20200905220815
Implantable Pulse Generator Implant Date: 20190502

## 2019-05-15 NOTE — Progress Notes (Signed)
Carelink Summary Report / Loop Recorder 

## 2019-05-21 ENCOUNTER — Telehealth: Payer: Self-pay

## 2019-05-21 NOTE — Telephone Encounter (Signed)
Pt returned my phone call. I let her know that her monitor transmit automatically and she do not need to send manual transmission unless I call to ask for one. Pt verbalized understanding and thanked me for the call.

## 2019-05-21 NOTE — Telephone Encounter (Signed)
LMOVM for pt. I was going to let her know we do not need her to send manual transmission with her home monitor. I left my direct office number for the pt to call back if she has questions.

## 2019-05-30 ENCOUNTER — Ambulatory Visit (INDEPENDENT_AMBULATORY_CARE_PROVIDER_SITE_OTHER): Payer: PPO | Admitting: *Deleted

## 2019-05-30 DIAGNOSIS — I639 Cerebral infarction, unspecified: Secondary | ICD-10-CM

## 2019-05-31 LAB — CUP PACEART REMOTE DEVICE CHECK
Date Time Interrogation Session: 20201008221253
Implantable Pulse Generator Implant Date: 20190502

## 2019-06-10 NOTE — Progress Notes (Signed)
Carelink Summary Report / Loop Recorder 

## 2019-06-24 ENCOUNTER — Other Ambulatory Visit (HOSPITAL_COMMUNITY): Payer: Self-pay | Admitting: Internal Medicine

## 2019-06-24 ENCOUNTER — Ambulatory Visit (HOSPITAL_COMMUNITY)
Admission: RE | Admit: 2019-06-24 | Discharge: 2019-06-24 | Disposition: A | Discharge status: Home / Self Care | Payer: PPO | Source: Ambulatory Visit | Attending: Family | Admitting: Family

## 2019-06-24 ENCOUNTER — Other Ambulatory Visit: Payer: Self-pay

## 2019-06-24 DIAGNOSIS — M79604 Pain in right leg: Secondary | ICD-10-CM | POA: Diagnosis not present

## 2019-06-24 DIAGNOSIS — L03115 Cellulitis of right lower limb: Secondary | ICD-10-CM | POA: Diagnosis not present

## 2019-06-28 DIAGNOSIS — L03115 Cellulitis of right lower limb: Secondary | ICD-10-CM | POA: Diagnosis not present

## 2019-06-28 DIAGNOSIS — M79604 Pain in right leg: Secondary | ICD-10-CM | POA: Diagnosis not present

## 2019-07-02 ENCOUNTER — Ambulatory Visit (INDEPENDENT_AMBULATORY_CARE_PROVIDER_SITE_OTHER): Payer: PPO | Admitting: *Deleted

## 2019-07-02 DIAGNOSIS — I639 Cerebral infarction, unspecified: Secondary | ICD-10-CM

## 2019-07-02 LAB — CUP PACEART REMOTE DEVICE CHECK
Date Time Interrogation Session: 20201110220511
Implantable Pulse Generator Implant Date: 20190502

## 2019-07-23 NOTE — Progress Notes (Signed)
Carelink Summary Report / Loop Recorder 

## 2019-08-05 ENCOUNTER — Ambulatory Visit (INDEPENDENT_AMBULATORY_CARE_PROVIDER_SITE_OTHER): Payer: PPO | Admitting: *Deleted

## 2019-08-05 DIAGNOSIS — D735 Infarction of spleen: Secondary | ICD-10-CM

## 2019-08-05 LAB — CUP PACEART REMOTE DEVICE CHECK
Date Time Interrogation Session: 20201213221217
Implantable Pulse Generator Implant Date: 20190502

## 2019-08-06 ENCOUNTER — Telehealth: Payer: Self-pay | Admitting: Internal Medicine

## 2019-08-06 NOTE — Telephone Encounter (Signed)
Spoke with pt and she is aware of appt 08/07/2019 @1 :30.

## 2019-08-06 NOTE — Telephone Encounter (Signed)
I spoke with patient. She states she has been having an irregular heart beat since ~ Sunday evening. She is unable to get a HR/ BP to register on her cuff at home. Symptoms include: - fatigue - a couple of dizzy spells this morning - slightly increased SOB with exertion, although she states she is overweight and gets SOB with exertion anyway.   The patient currently has a LINQ monitor in. I have asked her to try to send a manual transmission in for our device clinic to review. Per the patient, she has tried to send on in this morning and she is getting some type of orange screen with an error message on the transmitter box.  I have advised her I will forward a message to the [facility] and ask that they reach out to her to help assist with her transmission.  She is aware I will call her back once I know what her transmission shows.   The patient voices understanding and is agreeable.

## 2019-08-06 NOTE — Telephone Encounter (Signed)
Patient reports, she has not felt well since yesterday.Early 08/05/19 she had SOB with activity that has subsided today. She denies CP, palpiations, dizziness or syncope at this time. Remote transmission from [facility] received that showed ongoing AF with varying v-rates up to 150 bpm. + Eliquis. HX of AF. No missed doses of metoprolol and took extra 50 mg dose of metoprolol 08/05/19. Transmission reviewed with Dr Curt Bears and patient to be referred to AF clinic for evaluation. ED precautions given.

## 2019-08-06 NOTE — Telephone Encounter (Signed)
Patient c/o Palpitations:  High priority if patient c/o lightheadedness, shortness of breath, or chest pain  1) How long have you had palpitations/irregular HR/ Afib? Are you having the symptoms now? Started Sunday night  2) Are you currently experiencing lightheadedness, SOB or CP? Dizziness - had a couple dizzy spells this morning, no chest pain   3) Do you have a history of afib (atrial fibrillation) or irregular heart rhythm? yes  4) Have you checked your BP or HR? (document readings if available): no readings   5) Are you experiencing any other symptoms? When it happens it feels like heart is fluttering

## 2019-08-07 ENCOUNTER — Other Ambulatory Visit: Payer: Self-pay

## 2019-08-07 ENCOUNTER — Ambulatory Visit (HOSPITAL_COMMUNITY)
Admission: RE | Admit: 2019-08-07 | Discharge: 2019-08-07 | Disposition: A | Discharge status: Home / Self Care | Payer: PPO | Source: Ambulatory Visit | Attending: Nurse Practitioner | Admitting: Nurse Practitioner

## 2019-08-07 ENCOUNTER — Encounter (HOSPITAL_COMMUNITY): Payer: Self-pay | Admitting: Nurse Practitioner

## 2019-08-07 VITALS — BP 140/84 | HR 65 | Ht 67.0 in | Wt 306.0 lb

## 2019-08-07 DIAGNOSIS — D6869 Other thrombophilia: Secondary | ICD-10-CM

## 2019-08-07 DIAGNOSIS — I110 Hypertensive heart disease with heart failure: Secondary | ICD-10-CM | POA: Diagnosis not present

## 2019-08-07 DIAGNOSIS — I509 Heart failure, unspecified: Secondary | ICD-10-CM | POA: Diagnosis not present

## 2019-08-07 DIAGNOSIS — Z8261 Family history of arthritis: Secondary | ICD-10-CM | POA: Insufficient documentation

## 2019-08-07 DIAGNOSIS — Z887 Allergy status to serum and vaccine status: Secondary | ICD-10-CM | POA: Diagnosis not present

## 2019-08-07 DIAGNOSIS — E039 Hypothyroidism, unspecified: Secondary | ICD-10-CM | POA: Insufficient documentation

## 2019-08-07 DIAGNOSIS — Z87891 Personal history of nicotine dependence: Secondary | ICD-10-CM | POA: Insufficient documentation

## 2019-08-07 DIAGNOSIS — Z79890 Hormone replacement therapy (postmenopausal): Secondary | ICD-10-CM | POA: Diagnosis not present

## 2019-08-07 DIAGNOSIS — Z96653 Presence of artificial knee joint, bilateral: Secondary | ICD-10-CM | POA: Diagnosis not present

## 2019-08-07 DIAGNOSIS — Z8249 Family history of ischemic heart disease and other diseases of the circulatory system: Secondary | ICD-10-CM | POA: Diagnosis not present

## 2019-08-07 DIAGNOSIS — M1990 Unspecified osteoarthritis, unspecified site: Secondary | ICD-10-CM | POA: Insufficient documentation

## 2019-08-07 DIAGNOSIS — Z79899 Other long term (current) drug therapy: Secondary | ICD-10-CM | POA: Diagnosis not present

## 2019-08-07 DIAGNOSIS — Z7901 Long term (current) use of anticoagulants: Secondary | ICD-10-CM | POA: Diagnosis not present

## 2019-08-07 DIAGNOSIS — Z881 Allergy status to other antibiotic agents status: Secondary | ICD-10-CM | POA: Diagnosis not present

## 2019-08-07 DIAGNOSIS — I480 Paroxysmal atrial fibrillation: Secondary | ICD-10-CM

## 2019-08-07 DIAGNOSIS — I4891 Unspecified atrial fibrillation: Secondary | ICD-10-CM | POA: Diagnosis present

## 2019-08-07 MED ORDER — DILTIAZEM HCL 30 MG PO TABS: ORAL_TABLET | ORAL | 1 refills | Status: DC

## 2019-08-07 NOTE — Progress Notes (Signed)
Primary Care Physician: Merri BrunettePharr, Walter, MD Referring Physician:  Dr. Gillermina HuKlein/device clinic   Christine DodgeGwendolyn Robynn PaneJ [name] is a 68 y.o. female with a h/o splenic infart, LINQ,  paroxysmal afib  that is in the afib clinic for  afib seen on Linq and reported by the patient. She is in SR today. Her overall afib burden is low less than 2% and pt admits episodes are rare but the recent episode lasted longer than she is accustomed to.   Denies any recent change in health. No tobacco use, minimal caffeine/alcohol use. Has had a sleep study in the past and pt reports no sleep apnea. On eliquis 5 mg bid with a CHA2DS2VASc score of at least 5.  Today, she denies symptoms of palpitations, chest pain, shortness of breath, orthopnea, PND, lower extremity edema, dizziness, presyncope, syncope, or neurologic sequela. The patient is tolerating medications without difficulties and is otherwise without complaint today.   Past Medical History:  Diagnosis Date  . Arthritis   . CHF (congestive heart failure) (HCC)   . Essential hypertension 10/15/2015  . Heart murmur   . Hypertension   . Hypothyroidism 10/15/2015  . 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"   Past Surgical History:  Procedure Laterality Date  . CHOLECYSTECTOMY    . DILATION AND CURETTAGE OF UTERUS    . LOOP RECORDER INSERTION N/A 12/21/2017   Procedure: LOOP RECORDER INSERTION;  Surgeon: Duke SalviaKlein, Steven C, MD;  [facility];  Service: Cardiovascular;  Laterality: N/A;  . OMENTECTOMY N/A 10/12/2015   Procedure: PARTIAL OMENTECTOMY;  Surgeon: Violeta GelinasBurke Thompson, MD;  Location: [facility] OR;  Service: General;  Laterality: N/A;  . TOTAL KNEE ARTHROPLASTY Left 04/04/2016   Procedure: LEFT TOTAL KNEE ARTHROPLASTY;  Surgeon: Ollen GrossFrank Aluisio, MD;  Location: [facility] ORS;  Service: Orthopedics;  Laterality: Left;  . TOTAL KNEE ARTHROPLASTY Right 08/01/2016   Procedure: TOTAL KNEE ARTHROPLASTY;  Surgeon: Ollen GrossFrank Aluisio, MD;  Location: [facility] ORS;   Service: Orthopedics;  Laterality: Right;  . UMBILICAL HERNIA REPAIR N/A 10/12/2015   Procedure: HERNIA REPAIR UMBILICAL ADULT/INCARERATED;  Surgeon: Violeta GelinasBurke Thompson, MD;  [facility];  Service: General;  Laterality: N/A;    Current Outpatient Medications  Medication Sig Dispense Refill  . acetaminophen (TYLENOL) 650 MG CR tablet Take 500 mg by mouth at bedtime.     Marland Kitchen. ascorbic acid (VITAMIN C) 500 MG tablet Take 500 mg by mouth daily.    Marland Kitchen. ELDERBERRY PO Take by mouth.    Everlene Balls. ELIQUIS 5 MG TABS tablet TAKE 1 TABLET BY MOUTH TWICE A DAY 60 tablet 5  . furosemide (LASIX) 20 MG tablet Take 40 mg by mouth every morning.     Marland Kitchen. levothyroxine (SYNTHROID, LEVOTHROID) 75 MCG tablet Take 75 mcg by mouth daily before breakfast.   2  . metoprolol (LOPRESSOR) 100 MG tablet Take 100 mg by mouth 2 (two) times daily.     . Vitamin D, Cholecalciferol, 1000 units TABS Take 2,000 Units by mouth daily.    Marland Kitchen. diltiazem (CARDIZEM) 30 MG tablet Take 1 tablet every 4 hours AS NEEDED for heart rate >100 as long as blood pressure >100. 45 tablet 1   No current facility-administered medications for this encounter.    Allergies  Allergen Reactions  . Augmentin [Amoxicillin-Pot Clavulanate] Hives and Itching    Has patient had a PCN reaction causing immediate rash, facial/tongue/throat swelling, SOB or lightheadedness with hypotension:No Has patient had a PCN reaction causing severe rash involving  mucus membranes or skin necrosis:No Has patient had a PCN reaction that required hospitalization:No Has patient had a PCN reaction occurring within the last 10 years:No If all of the above answers are "NO", then may proceed with Cephalosporin use.   . Pneumococcal Vaccines Other (See Comments)    Caused fever, and swelling at injection site    Social History   Socioeconomic History  . Marital status: Widowed    Spouse name: Not on file  . Number of children: Not on file  . Years of education: Not on file  . Highest  education level: Not on file  Occupational History  . Not on file  Tobacco Use  . Smoking status: Former Smoker    Quit date: 03/29/1987    Years since quitting: 32.3  . Smokeless tobacco: Never Used  Substance and Sexual Activity  . Alcohol use: No  . Drug use: No  . Sexual activity: Not on file  Other Topics Concern  . Not on file  Social History Narrative  . Not on file   Social Determinants of Health   Financial Resource Strain:   . Difficulty of Paying Living Expenses: Not on file  Food Insecurity:   . Worried About Programme researcher, broadcasting/film/video in the Last Year: Not on file  . Ran Out of Food in the Last Year: Not on file  Transportation Needs:   . Lack of Transportation (Medical): Not on file  . Lack of Transportation (Non-Medical): Not on file  Physical Activity:   . Days of Exercise per Week: Not on file  . Minutes of Exercise per Session: Not on file  Stress:   . Feeling of Stress : Not on file  Social Connections:   . Frequency of Communication with Friends and Family: Not on file  . Frequency of Social Gatherings with Friends and Family: Not on file  . Attends Religious Services: Not on file  . Active Member of Clubs or Organizations: Not on file  . Attends Banker Meetings: Not on file  . Marital Status: Not on file  Intimate Partner Violence:   . Fear of Current or Ex-Partner: Not on file  . Emotionally Abused: Not on file  . Physically Abused: Not on file  . Sexually Abused: Not on file    Family History  Problem Relation Age of Onset  . Rheum arthritis Mother   . Heart attack Father   . Heart disease Father     ROS- All systems are reviewed and negative except as per the HPI above  Physical Exam: Vitals:   08/07/19 1330  BP: 140/84  Pulse: 65  Weight: (!) 138.8 kg  Height: 5\' 7"  (1.702 m)   Wt Readings from Last 3 Encounters:  08/07/19 (!) 138.8 kg  05/01/18 131.5 kg  04/03/18 129.8 kg    Labs: Lab Results  Component Value Date     NA 139 12/07/2017   K 3.8 12/07/2017   CL 103 12/07/2017   CO2 29 12/07/2017   GLUCOSE 104 (H) 12/07/2017   BUN 39 (H) 12/07/2017   CREATININE 0.80 12/07/2017   CALCIUM 8.8 (L) 12/07/2017   Lab Results  Component Value Date   INR 1.13 12/08/2017   No results found for: CHOL, HDL, LDLCALC, TRIG   GEN- The patient is well appearing, alert and oriented x 3 today.   Head- normocephalic, atraumatic Eyes-  Sclera clear, conjunctiva pink Ears- hearing intact Oropharynx- clear Neck- supple, no JVP Lymph- no cervical  lymphadenopathy Lungs- Clear to ausculation bilaterally, normal work of breathing Heart- Regular rate and rhythm, no murmurs, rubs or gallops, PMI not laterally displaced GI- soft, NT, ND, + BS Extremities- no clubbing, cyanosis, or edema MS- no significant deformity or atrophy Skin- no rash or lesion Psych- euthymic mood, full affect Neuro- strength and sensation are intact  EKG- NSR at 65 bpm, LAD, pr int 184 ms, qrs int l98 ms, qtc 440 ms Epic records reviewed including Linq reports    Assessment and Plan: 1. Paroxysmal afib Pt agrees afib is rare and low burden per Linq reports for now Will continue metoprolol 100 mg bid  Will rx cardizem 30 mg as needed for afib episodes if BP is over 563 systolic and HR is over 875 BPM Questions answered and reassured  She was encouraged to call here as needed if she needs additional guidance for breakthrough afib.  If afib burden increases can readdress need for additional daily rate control or antiarrythmic drugs   2. CHA2DS2VASc score of at least 5 Continue eliquis 5 mg bid   F/u afib clinic as needed  Butch Penny C. Mozell Hardacre, [facility] [address] [place], [place] [address] [phone]

## 2019-08-19 ENCOUNTER — Other Ambulatory Visit: Payer: Self-pay | Admitting: Internal Medicine

## 2019-08-19 NOTE — Telephone Encounter (Signed)
Eliquis 5mg  refill request received, pt is 68yrs old, weight-138.8kg, Crea-0.89 on 04/12/2019 via scanned labs from pt's PCP Dr. Shelia Media, Diagnosis-Afib, and last seen by Roderic Palau on 08/07/2019. Dose is appropriate based on dosing criteria. Will send in refill to requested pharmacy.

## 2019-09-03 NOTE — Progress Notes (Signed)
ILR remote 

## 2019-09-06 ENCOUNTER — Ambulatory Visit (INDEPENDENT_AMBULATORY_CARE_PROVIDER_SITE_OTHER): Payer: PPO | Admitting: *Deleted

## 2019-09-06 DIAGNOSIS — D735 Infarction of spleen: Secondary | ICD-10-CM | POA: Diagnosis not present

## 2019-09-08 LAB — CUP PACEART REMOTE DEVICE CHECK
Date Time Interrogation Session: 20210116165553
Implantable Pulse Generator Implant Date: 20190502

## 2019-09-08 NOTE — Progress Notes (Signed)
ILR remote 

## 2019-10-07 ENCOUNTER — Ambulatory Visit (INDEPENDENT_AMBULATORY_CARE_PROVIDER_SITE_OTHER): Payer: PPO | Admitting: *Deleted

## 2019-10-07 DIAGNOSIS — D735 Infarction of spleen: Secondary | ICD-10-CM

## 2019-10-07 LAB — CUP PACEART REMOTE DEVICE CHECK
Date Time Interrogation Session: 20210214232549
Implantable Pulse Generator Implant Date: 20190502

## 2019-10-08 NOTE — Progress Notes (Signed)
ILR Remote 

## 2019-10-09 DIAGNOSIS — M8580 Other specified disorders of bone density and structure, unspecified site: Secondary | ICD-10-CM | POA: Diagnosis not present

## 2019-10-09 DIAGNOSIS — I10 Essential (primary) hypertension: Secondary | ICD-10-CM | POA: Diagnosis not present

## 2019-10-09 DIAGNOSIS — E559 Vitamin D deficiency, unspecified: Secondary | ICD-10-CM | POA: Diagnosis not present

## 2019-10-09 DIAGNOSIS — E039 Hypothyroidism, unspecified: Secondary | ICD-10-CM | POA: Diagnosis not present

## 2019-10-16 DIAGNOSIS — I129 Hypertensive chronic kidney disease with stage 1 through stage 4 chronic kidney disease, or unspecified chronic kidney disease: Secondary | ICD-10-CM | POA: Diagnosis not present

## 2019-10-16 DIAGNOSIS — I480 Paroxysmal atrial fibrillation: Secondary | ICD-10-CM | POA: Diagnosis not present

## 2019-10-16 DIAGNOSIS — E559 Vitamin D deficiency, unspecified: Secondary | ICD-10-CM | POA: Diagnosis not present

## 2019-10-16 DIAGNOSIS — Z7901 Long term (current) use of anticoagulants: Secondary | ICD-10-CM | POA: Diagnosis not present

## 2019-10-16 DIAGNOSIS — E039 Hypothyroidism, unspecified: Secondary | ICD-10-CM | POA: Diagnosis not present

## 2019-10-31 ENCOUNTER — Ambulatory Visit: Payer: PPO | Attending: Internal Medicine

## 2019-10-31 DIAGNOSIS — Z23 Encounter for immunization: Secondary | ICD-10-CM

## 2019-10-31 NOTE — Progress Notes (Signed)
   Covid-19 Vaccination Clinic  Name:  [name]    MRN: [id number] DOB: Aug 10, 1951  10/31/2019  [name] was observed post Covid-19 immunization for 30 minutes based on pre-vaccination screening without incident. She was provided with Vaccine Information Sheet and instruction to access the V-Safe system.   [name] was instructed to call 911 with any severe reactions post vaccine: Marland Kitchen Difficulty breathing  . Swelling of face and throat  . A fast heartbeat  . A bad rash all over body  . Dizziness and weakness   Immunizations Administered    Name Date Dose VIS Date Route   Pfizer COVID-19 Vaccine 10/31/2019 11:36 AM 0.3 mL 08/02/2019 Intramuscular   Manufacturer: [employer], [employer]   Lot: [id number]   NDC: [address]-6

## 2019-11-07 ENCOUNTER — Ambulatory Visit (INDEPENDENT_AMBULATORY_CARE_PROVIDER_SITE_OTHER): Payer: PPO | Admitting: *Deleted

## 2019-11-07 DIAGNOSIS — D735 Infarction of spleen: Secondary | ICD-10-CM | POA: Diagnosis not present

## 2019-11-08 LAB — CUP PACEART REMOTE DEVICE CHECK
Date Time Interrogation Session: 20210318012227
Implantable Pulse Generator Implant Date: 20190502

## 2019-11-08 NOTE — Progress Notes (Signed)
ILR Remote 

## 2019-11-25 ENCOUNTER — Ambulatory Visit: Payer: PPO | Attending: Internal Medicine

## 2019-11-25 DIAGNOSIS — Z23 Encounter for immunization: Secondary | ICD-10-CM

## 2019-11-25 NOTE — Progress Notes (Signed)
   Covid-19 Vaccination Clinic  Name:  [name]    MRN: [id number] DOB: 1951/04/05  11/25/2019  [name] was observed post Covid-19 immunization for 15 minutes without incident. She was provided with Vaccine Information Sheet and instruction to access the V-Safe system.   [name] was instructed to call 911 with any severe reactions post vaccine: Marland Kitchen Difficulty breathing  . Swelling of face and throat  . A fast heartbeat  . A bad rash all over body  . Dizziness and weakness   Immunizations Administered    Name Date Dose VIS Date Route   Pfizer COVID-19 Vaccine 11/25/2019  9:47 AM 0.3 mL 08/02/2019 Intramuscular   Manufacturer: [employer], [employer]   Lot: [id number]   NDC: [address]-3

## 2019-12-08 LAB — CUP PACEART REMOTE DEVICE CHECK
Date Time Interrogation Session: 20210418013207
Implantable Pulse Generator Implant Date: 20190502

## 2019-12-09 ENCOUNTER — Ambulatory Visit (INDEPENDENT_AMBULATORY_CARE_PROVIDER_SITE_OTHER): Payer: PPO | Admitting: *Deleted

## 2019-12-09 DIAGNOSIS — D735 Infarction of spleen: Secondary | ICD-10-CM

## 2019-12-10 NOTE — Progress Notes (Signed)
ILR Remote 

## 2019-12-23 ENCOUNTER — Telehealth: Payer: Self-pay | Admitting: Emergency Medicine

## 2019-12-23 NOTE — Telephone Encounter (Signed)
Patient sent remote transmission on 12/21/19 because she felt tlike she was in AF on 12/20/19 and 12/21/19. She had SOB with activity and had no energy, no CP, no dizziness or syncope. Patient did not take a prn dose of cardizem 30 mg because her HR was reading 95 and she has been told in the past to take it if HR > 100. Patient had episode of Af with RVR on 12/20/19 that lasted 12 hrs and 44 minute s with max v-rate of 171 bpm. 12/21/19 she had AF with RVR that lasted 21 hours and 32 minutes with average v-rate 146 bpm. Had patient send remote transmission and she is SR at this time and reports that she is asymptomatic. Encouraged to take prn cardizem if she is symptomatic with SBP > 100.

## 2020-01-08 ENCOUNTER — Ambulatory Visit (INDEPENDENT_AMBULATORY_CARE_PROVIDER_SITE_OTHER): Payer: PPO | Admitting: *Deleted

## 2020-01-08 DIAGNOSIS — I639 Cerebral infarction, unspecified: Secondary | ICD-10-CM | POA: Diagnosis not present

## 2020-01-08 LAB — CUP PACEART REMOTE DEVICE CHECK
Date Time Interrogation Session: 20210519013413
Implantable Pulse Generator Implant Date: 20190502

## 2020-01-10 NOTE — Progress Notes (Signed)
Carelink Summary Report / Loop Recorder 

## 2020-01-13 ENCOUNTER — Telehealth: Payer: Self-pay

## 2020-01-13 NOTE — Telephone Encounter (Signed)
Spoke with patient to inform of disconnected monitor. °

## 2020-01-29 ENCOUNTER — Telehealth: Payer: Self-pay

## 2020-01-29 NOTE — Telephone Encounter (Signed)
Left message for patient to inform of disconnected monitor. 

## 2020-02-10 ENCOUNTER — Ambulatory Visit (INDEPENDENT_AMBULATORY_CARE_PROVIDER_SITE_OTHER): Payer: PPO | Admitting: *Deleted

## 2020-02-10 DIAGNOSIS — I639 Cerebral infarction, unspecified: Secondary | ICD-10-CM | POA: Diagnosis not present

## 2020-02-10 LAB — CUP PACEART REMOTE DEVICE CHECK
Date Time Interrogation Session: 20210621031155
Implantable Pulse Generator Implant Date: 20190502

## 2020-02-11 NOTE — Progress Notes (Signed)
Carelink Summary Report / Loop Recorder 

## 2020-03-16 ENCOUNTER — Ambulatory Visit (INDEPENDENT_AMBULATORY_CARE_PROVIDER_SITE_OTHER): Payer: PPO | Admitting: *Deleted

## 2020-03-16 DIAGNOSIS — I639 Cerebral infarction, unspecified: Secondary | ICD-10-CM | POA: Diagnosis not present

## 2020-03-17 LAB — CUP PACEART REMOTE DEVICE CHECK
Date Time Interrogation Session: 20210725232917
Implantable Pulse Generator Implant Date: 20190502

## 2020-03-19 NOTE — Progress Notes (Signed)
Carelink Summary Report / Loop Recorder 

## 2020-04-20 ENCOUNTER — Ambulatory Visit (INDEPENDENT_AMBULATORY_CARE_PROVIDER_SITE_OTHER): Payer: PPO | Admitting: *Deleted

## 2020-04-20 ENCOUNTER — Other Ambulatory Visit (HOSPITAL_COMMUNITY): Payer: Self-pay | Admitting: *Deleted

## 2020-04-20 DIAGNOSIS — I639 Cerebral infarction, unspecified: Secondary | ICD-10-CM

## 2020-04-20 LAB — CUP PACEART REMOTE DEVICE CHECK
Date Time Interrogation Session: 20210827233849
Implantable Pulse Generator Implant Date: 20190502

## 2020-04-22 NOTE — Progress Notes (Signed)
Carelink Summary Report / Loop Recorder 

## 2020-04-30 ENCOUNTER — Telehealth: Payer: Self-pay

## 2020-04-30 NOTE — Telephone Encounter (Signed)
Called patient to discuss stopping monthly remote transmission per Dr. Graciela Husbands. Patient agreeable to plan.   Patient does not have follow-up with AF Clinic or Dr. Graciela Husbands. Patient would like to know when she has her next follow-up. Wants to know how her battery will be checked for when it is no longer working.  Advised patient we will contact her once we have a plan.   Routing to Dr. Graciela Husbands for recommendation.

## 2020-05-04 NOTE — Telephone Encounter (Signed)
Will see her annually

## 2020-05-08 NOTE — Telephone Encounter (Signed)
Spoke with pt regarding Dr. Odessa Fleming f/u recommendation.  Pt was last seen in AF Clinic in 07/2020.  Pt accepted f/u with Dr. Graciela Husbands on 07/28/20 at 10:00am in [place].  She denies additional questions or concerns at this time.

## 2020-05-13 ENCOUNTER — Telehealth: Payer: Self-pay

## 2020-05-13 NOTE — Telephone Encounter (Signed)
Called patient regards to Dr. Graciela Husbands would like for patient to leave ILR in place. Patients husband states she is at work and he will have her call when she returns home.

## 2020-05-21 ENCOUNTER — Ambulatory Visit (INDEPENDENT_AMBULATORY_CARE_PROVIDER_SITE_OTHER): Payer: PPO

## 2020-05-21 DIAGNOSIS — I639 Cerebral infarction, unspecified: Secondary | ICD-10-CM

## 2020-05-21 LAB — CUP PACEART REMOTE DEVICE CHECK
Date Time Interrogation Session: 20210929233649
Implantable Pulse Generator Implant Date: 20190502

## 2020-05-25 DIAGNOSIS — M8580 Other specified disorders of bone density and structure, unspecified site: Secondary | ICD-10-CM | POA: Diagnosis not present

## 2020-05-25 DIAGNOSIS — E559 Vitamin D deficiency, unspecified: Secondary | ICD-10-CM | POA: Diagnosis not present

## 2020-05-25 DIAGNOSIS — Z0000 Encounter for general adult medical examination without abnormal findings: Secondary | ICD-10-CM | POA: Diagnosis not present

## 2020-05-25 DIAGNOSIS — E039 Hypothyroidism, unspecified: Secondary | ICD-10-CM | POA: Diagnosis not present

## 2020-05-25 NOTE — Progress Notes (Signed)
Carelink Summary Report / Loop Recorder 

## 2020-06-01 DIAGNOSIS — I480 Paroxysmal atrial fibrillation: Secondary | ICD-10-CM | POA: Diagnosis not present

## 2020-06-01 DIAGNOSIS — I10 Essential (primary) hypertension: Secondary | ICD-10-CM | POA: Diagnosis not present

## 2020-06-01 DIAGNOSIS — Z0000 Encounter for general adult medical examination without abnormal findings: Secondary | ICD-10-CM | POA: Diagnosis not present

## 2020-06-01 DIAGNOSIS — E039 Hypothyroidism, unspecified: Secondary | ICD-10-CM | POA: Diagnosis not present

## 2020-06-01 DIAGNOSIS — Z7901 Long term (current) use of anticoagulants: Secondary | ICD-10-CM | POA: Diagnosis not present

## 2020-06-01 DIAGNOSIS — E669 Obesity, unspecified: Secondary | ICD-10-CM | POA: Diagnosis not present

## 2020-06-01 DIAGNOSIS — Z23 Encounter for immunization: Secondary | ICD-10-CM | POA: Diagnosis not present

## 2020-06-15 ENCOUNTER — Other Ambulatory Visit: Payer: Self-pay | Admitting: Nurse Practitioner

## 2020-06-15 NOTE — Telephone Encounter (Signed)
Refill request

## 2020-06-15 NOTE — Telephone Encounter (Signed)
Pt overdue to see Dr. Graciela Husbands - she has has several remote pacer checks, but has not seen Dr. Graciela Husbands since 04/03/18. Routing to scheduling.

## 2020-06-16 NOTE — Telephone Encounter (Signed)
Refill was already sent in yesterday

## 2020-06-16 NOTE — Telephone Encounter (Signed)
Scheduled next available in December

## 2020-06-19 DIAGNOSIS — R1902 Left upper quadrant abdominal swelling, mass and lump: Secondary | ICD-10-CM | POA: Diagnosis not present

## 2020-06-19 DIAGNOSIS — I10 Essential (primary) hypertension: Secondary | ICD-10-CM | POA: Diagnosis not present

## 2020-06-22 ENCOUNTER — Other Ambulatory Visit: Payer: Self-pay | Admitting: Internal Medicine

## 2020-06-22 DIAGNOSIS — R1909 Other intra-abdominal and pelvic swelling, mass and lump: Secondary | ICD-10-CM

## 2020-06-23 ENCOUNTER — Ambulatory Visit (INDEPENDENT_AMBULATORY_CARE_PROVIDER_SITE_OTHER): Payer: PPO

## 2020-06-23 DIAGNOSIS — I639 Cerebral infarction, unspecified: Secondary | ICD-10-CM | POA: Diagnosis not present

## 2020-06-23 LAB — CUP PACEART REMOTE DEVICE CHECK
Date Time Interrogation Session: 20211101233718
Implantable Pulse Generator Implant Date: 20190502

## 2020-06-25 ENCOUNTER — Ambulatory Visit
Admission: RE | Admit: 2020-06-25 | Discharge: 2020-06-25 | Disposition: A | Discharge status: Home / Self Care | Payer: PPO | Source: Ambulatory Visit | Attending: Internal Medicine | Admitting: Internal Medicine

## 2020-06-25 DIAGNOSIS — R590 Localized enlarged lymph nodes: Secondary | ICD-10-CM | POA: Diagnosis not present

## 2020-06-25 DIAGNOSIS — R1909 Other intra-abdominal and pelvic swelling, mass and lump: Secondary | ICD-10-CM

## 2020-06-25 NOTE — Progress Notes (Signed)
Carelink Summary Report / Loop Recorder 

## 2020-07-07 ENCOUNTER — Telehealth: Payer: Self-pay | Admitting: Internal Medicine

## 2020-07-07 ENCOUNTER — Other Ambulatory Visit: Payer: Self-pay | Admitting: Internal Medicine

## 2020-07-07 DIAGNOSIS — Z1231 Encounter for screening mammogram for malignant neoplasm of breast: Secondary | ICD-10-CM

## 2020-07-07 MED ORDER — APIXABAN 5 MG PO TABS: 5.0000 mg | ORAL_TABLET | Freq: Two times a day (BID) | ORAL | 1 refills | Status: DC

## 2020-07-07 NOTE — Telephone Encounter (Signed)
*  STAT* If patient is at the pharmacy, call can be transferred to refill team.   1. Which medications need to be refilled? (please list name of each medication and dose if known) Eliquis 5 mg bid  2. Which pharmacy/location (including street and city if local pharmacy) is medication to be sent to?[facility] ([place])  3. Do they need a 30 day or 90 day supply? 30   Appt scheduled for 12/7 with Graciela Husbands

## 2020-07-07 NOTE — Telephone Encounter (Signed)
Eliquis 5mg  refill request received. Patient is 69 years old, weight-138.8kg, Crea-1.09 on 05/25/2020 via [facility] from [facility] PCP, Diagnosis-Afib, and last seen by [place] on 08/07/2019 and will be seen by Dr. 08/09/2019 on 07/28/2020. Dose is appropriate based on dosing criteria. Will send in refill to requested pharmacy.  Refill was sent by [facility].

## 2020-07-07 NOTE — Telephone Encounter (Signed)
Please review for refill. Thanks!  

## 2020-07-07 NOTE — Telephone Encounter (Signed)
43f 129.8kg Scr 0.93 10/09/19 Lovw/ klein 04/03/18 but has a visit coming up 07/28/20 Refill granted for 5months of 5mg  eliquis since she has an appt scheduled

## 2020-07-09 ENCOUNTER — Telehealth: Payer: Self-pay

## 2020-07-09 NOTE — Telephone Encounter (Signed)
Patient called concerned about being in AF . Unable to send remote transmission due to handheld piece on monitor inoperable at this time. Patient concerned about taking prn dose of Cardizem due to other medications. Education done to take BP prior to taking the medication and if SBP 100 then OK to take medication per provider order. Patient concerned she is in AF due to fatigue. Will forward to AF Clinic to  discern if appointment warranted at this time.

## 2020-07-09 NOTE — Telephone Encounter (Signed)
The pt states she can not get her monitor to work. She thinks she been in A-fib since Monday. I tried to help her send a manual transmissions. [employer] is replacing the handheld reader. She should receive the new handheld in 7-10 business days. The pt states she can feel the A-fib.

## 2020-07-10 NOTE — Telephone Encounter (Signed)
Transmission received, pt in AF since 07/06/20

## 2020-07-10 NOTE — Telephone Encounter (Signed)
Spoke with patient she feels her HRs have settled some but still out of rhythm. She will use PRN cardizem for HRs over 100 and call Monday with update. If still out of rhythm will bring in for assessment.

## 2020-07-13 NOTE — Telephone Encounter (Signed)
Pt continues in AF will bring in tomorrow.

## 2020-07-14 ENCOUNTER — Encounter (HOSPITAL_COMMUNITY): Payer: Self-pay | Admitting: Nurse Practitioner

## 2020-07-14 ENCOUNTER — Other Ambulatory Visit: Payer: Self-pay

## 2020-07-14 ENCOUNTER — Ambulatory Visit (HOSPITAL_COMMUNITY)
Admission: RE | Admit: 2020-07-14 | Discharge: 2020-07-14 | Disposition: A | Discharge status: Home / Self Care | Payer: PPO | Source: Ambulatory Visit | Attending: Nurse Practitioner | Admitting: Nurse Practitioner

## 2020-07-14 VITALS — BP 150/98 | HR 84 | Ht 67.0 in | Wt 321.6 lb

## 2020-07-14 DIAGNOSIS — I4819 Other persistent atrial fibrillation: Secondary | ICD-10-CM | POA: Diagnosis not present

## 2020-07-14 DIAGNOSIS — Z87891 Personal history of nicotine dependence: Secondary | ICD-10-CM | POA: Diagnosis not present

## 2020-07-14 DIAGNOSIS — Z880 Allergy status to penicillin: Secondary | ICD-10-CM | POA: Insufficient documentation

## 2020-07-14 DIAGNOSIS — Z887 Allergy status to serum and vaccine status: Secondary | ICD-10-CM | POA: Insufficient documentation

## 2020-07-14 DIAGNOSIS — Z79890 Hormone replacement therapy (postmenopausal): Secondary | ICD-10-CM | POA: Insufficient documentation

## 2020-07-14 DIAGNOSIS — D6869 Other thrombophilia: Secondary | ICD-10-CM

## 2020-07-14 DIAGNOSIS — Z79899 Other long term (current) drug therapy: Secondary | ICD-10-CM | POA: Insufficient documentation

## 2020-07-14 DIAGNOSIS — Z7901 Long term (current) use of anticoagulants: Secondary | ICD-10-CM | POA: Diagnosis not present

## 2020-07-14 DIAGNOSIS — Z8249 Family history of ischemic heart disease and other diseases of the circulatory system: Secondary | ICD-10-CM | POA: Diagnosis not present

## 2020-07-14 LAB — CBC
HCT: 43.6 % (ref 36.0–46.0)
Hemoglobin: 14 g/dL (ref 12.0–15.0)
MCH: 29.7 pg (ref 26.0–34.0)
MCHC: 32.1 g/dL (ref 30.0–36.0)
MCV: 92.6 fL (ref 80.0–100.0)
Platelets: 267 10*3/uL (ref 150–400)
RBC: 4.71 MIL/uL (ref 3.87–5.11)
RDW: 14.6 % (ref 11.5–15.5)
WBC: 8.4 10*3/uL (ref 4.0–10.5)
nRBC: 0 % (ref 0.0–0.2)

## 2020-07-14 LAB — BASIC METABOLIC PANEL
Anion gap: 10 (ref 5–15)
BUN: 25 mg/dL — ABNORMAL HIGH (ref 8–23)
CO2: 28 mmol/L (ref 22–32)
Calcium: 9.4 mg/dL (ref 8.9–10.3)
Chloride: 103 mmol/L (ref 98–111)
Creatinine, Ser: 0.91 mg/dL (ref 0.44–1.00)
GFR, Estimated: >60 mL/min (ref >60)
Glucose, Bld: 98 mg/dL (ref 70–99)
Potassium: 4 mmol/L (ref 3.5–5.1)
Sodium: 141 mmol/L (ref 135–145)

## 2020-07-14 LAB — TSH: TSH: 2.506 u[IU]/mL (ref 0.350–4.500)

## 2020-07-14 NOTE — Progress Notes (Addendum)
Primary Care Physician: [name], MD Referring Physician:  Dr. [facility]   [name] is a 69 y.o. female with a h/o splenic infart, LINQ,  paroxysmal afib  that is in the afib clinic for  afib seen on Linq since 11/15. Pt felt she was in afib and requested a report to be run at the device clinic, which confirmed afib..   Denies any recent change in health. No tobacco use, minimal caffeine/alcohol use. Has had a sleep study in the past and pt reports no sleep apnea. On eliquis 5 mg bid with a CHA2DS2VASc score of at least 5. She is symptomatic with fatigue. Has chronic LLE, no change with this.   Today, she denies symptoms of palpitations, chest pain, shortness of breath, orthopnea, PND, lower extremity edema, dizziness, presyncope, syncope, or neurologic sequela. The patient is tolerating medications without difficulties and is otherwise without complaint today.   Past Medical History:  Diagnosis Date  . Arthritis   . CHF (congestive heart failure) (HCC)   . Essential hypertension 10/15/2015  . Heart murmur   . Hypertension   . Hypothyroidism 10/15/2015  . 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"   Past Surgical History:  Procedure Laterality Date  . CHOLECYSTECTOMY    . DILATION AND CURETTAGE OF UTERUS    . LOOP RECORDER INSERTION N/A 12/21/2017   Procedure: LOOP RECORDER INSERTION;  Surgeon: Duke Salvia, MD;  Location: [facility] INVASIVE CV LAB;  Service: Cardiovascular;  Laterality: N/A;  . OMENTECTOMY N/A 10/12/2015   Procedure: PARTIAL OMENTECTOMY;  Surgeon: Violeta Gelinas, MD;  Location: [facility] OR;  Service: General;  Laterality: N/A;  . TOTAL KNEE ARTHROPLASTY Left 04/04/2016   Procedure: LEFT TOTAL KNEE ARTHROPLASTY;  Surgeon: Ollen Gross, MD;  Location: [facility] ORS;  Service: Orthopedics;  Laterality: Left;  . TOTAL KNEE ARTHROPLASTY Right 08/01/2016   Procedure: TOTAL KNEE ARTHROPLASTY;  Surgeon: Ollen Gross, MD;  Location: [facility] ORS;   Service: Orthopedics;  Laterality: Right;  . UMBILICAL HERNIA REPAIR N/A 10/12/2015   Procedure: HERNIA REPAIR UMBILICAL ADULT/INCARERATED;  Surgeon: Violeta Gelinas, MD;  Location: [facility] OR;  Service: General;  Laterality: N/A;    Current Outpatient Medications  Medication Sig Dispense Refill  . acetaminophen (TYLENOL) 650 MG CR tablet Take 500 mg by mouth at bedtime.     Marland Kitchen amLODipine (NORVASC) 5 MG tablet Take 5 mg by mouth daily.    Marland Kitchen apixaban (ELIQUIS) 5 MG TABS tablet Take 1 tablet (5 mg total) by mouth 2 (two) times daily. 180 tablet 1  . ascorbic acid (VITAMIN C) 500 MG tablet Take 500 mg by mouth daily.    Marland Kitchen diltiazem (CARDIZEM) 30 MG tablet Take 1 tablet every 4 hours AS NEEDED for heart rate >100 as long as blood pressure >100. 45 tablet 1  . ELDERBERRY PO Take by mouth daily.     . furosemide (LASIX) 20 MG tablet Take 40 mg by mouth every morning.     Marland Kitchen levothyroxine (SYNTHROID, LEVOTHROID) 75 MCG tablet Take 75 mcg by mouth daily before breakfast.   2  . metoprolol (LOPRESSOR) 100 MG tablet Take 100 mg by mouth 2 (two) times daily.     . Vitamin D, Cholecalciferol, 1000 units TABS Take 2,000 Units by mouth daily.     No current facility-administered medications for this encounter.    Allergies  Allergen Reactions  . Augmentin [Amoxicillin-Pot Clavulanate] Hives and Itching    Has patient had a PCN  reaction causing immediate rash, facial/tongue/throat swelling, SOB or lightheadedness with hypotension:No Has patient had a PCN reaction causing severe rash involving mucus membranes or skin necrosis:No Has patient had a PCN reaction that required hospitalization:No Has patient had a PCN reaction occurring within the last 10 years:No If all of the above answers are "NO", then may proceed with Cephalosporin use.   . Pneumococcal Vaccines Other (See Comments)    Caused fever, and swelling at injection site    Social History   Socioeconomic History  . Marital status: Widowed     Spouse name: Not on file  . Number of children: Not on file  . Years of education: Not on file  . Highest education level: Not on file  Occupational History  . Not on file  Tobacco Use  . Smoking status: Former Smoker    Quit date: 03/29/1987    Years since quitting: 33.3  . Smokeless tobacco: Never Used  Substance and Sexual Activity  . Alcohol use: No  . Drug use: No  . Sexual activity: Not on file  Other Topics Concern  . Not on file  Social History Narrative  . Not on file   Social Determinants of Health   Financial Resource Strain:   . Difficulty of Paying Living Expenses: Not on file  Food Insecurity:   . Worried About Programme researcher, broadcasting/film/video in the Last Year: Not on file  . Ran Out of Food in the Last Year: Not on file  Transportation Needs:   . Lack of Transportation (Medical): Not on file  . Lack of Transportation (Non-Medical): Not on file  Physical Activity:   . Days of Exercise per Week: Not on file  . Minutes of Exercise per Session: Not on file  Stress:   . Feeling of Stress : Not on file  Social Connections:   . Frequency of Communication with Friends and Family: Not on file  . Frequency of Social Gatherings with Friends and Family: Not on file  . Attends Religious Services: Not on file  . Active Member of Clubs or Organizations: Not on file  . Attends Banker Meetings: Not on file  . Marital Status: Not on file  Intimate Partner Violence:   . Fear of Current or Ex-Partner: Not on file  . Emotionally Abused: Not on file  . Physically Abused: Not on file  . Sexually Abused: Not on file    Family History  Problem Relation Age of Onset  . Rheum arthritis Mother   . Heart attack Father   . Heart disease Father     ROS- All systems are reviewed and negative except as per the HPI above  Physical Exam: Vitals:   07/14/20 0856  BP: (!) 150/98  Pulse: 84  Weight: (!) 145.9 kg  Height: 5\' 7"  (1.702 m)   Wt Readings from Last 3 Encounters:   07/14/20 (!) 145.9 kg  08/07/19 (!) 138.8 kg  05/01/18 131.5 kg    Labs: Lab Results  Component Value Date   NA 141 07/14/2020   K 4.0 07/14/2020   CL 103 07/14/2020   CO2 28 07/14/2020   GLUCOSE 98 07/14/2020   BUN 25 (H) 07/14/2020   CREATININE 0.91 07/14/2020   CALCIUM 9.4 07/14/2020   Lab Results  Component Value Date   INR 1.13 12/08/2017   No results found for: CHOL, HDL, LDLCALC, TRIG   GEN- The patient is well appearing, alert and oriented x 3 today.   Head-  normocephalic, atraumatic Eyes-  Sclera clear, conjunctiva pink Ears- hearing intact Oropharynx- clear Neck- supple, no JVP Lymph- no cervical lymphadenopathy Lungs- Clear to ausculation bilaterally, normal work of breathing Heart- irregular  rate and rhythm, no murmurs, rubs or gallops, PMI not laterally displaced GI- soft, NT, ND, + BS Extremities- no clubbing, cyanosis, or edema MS- no significant deformity or atrophy Skin- no rash or lesion Psych- euthymic mood, full affect Neuro- strength and sensation are intact  EKG- afib at 84 bpm, qrs int 100 ms, qtc 477 ms    Assessment and Plan: 1. Persistent  afib x one week  Afib has been rare and low burden   Will continue metoprolol 100 mg bid  Has  cardizem 30 mg as needed for afib episodes if BP is over 100 systolic and HR is over 100 BPM Her v rates are controlled Cardioversion, risk vrs benefit  discussed and she would like to pursue  Cbc/bmet/tsh/covid scheduled  Has had covid vaccines   2. CHA2DS2VASc score of at least 5 Continue eliquis 5 mg bid  States no missed doses for at least 3 weeks   F/u with Dr. Graciela Husbands as scheduled after cardioversion   Lupita Leash [facility] [address] [place], [place] [address] [phone]

## 2020-07-14 NOTE — Patient Instructions (Signed)
Cardioversion scheduled for Monday, December 6th  - Arrive at the [facility] and go to admitting at [facility] not eat or drink anything after midnight the night prior to your procedure.  - Take all your morning medication (except diabetic medications) with a sip of water prior to arrival.  - You will not be able to drive home after your procedure.  - Do NOT miss any doses of your blood thinner - if you should miss a dose please notify our office immediately.  - If you feel as if you go back into normal rhythm prior to scheduled cardioversion, please notify our office immediately. If your procedure is canceled in the cardioversion suite you will be charged a cancellation fee.

## 2020-07-21 ENCOUNTER — Telehealth (HOSPITAL_COMMUNITY): Payer: Self-pay | Admitting: *Deleted

## 2020-07-21 NOTE — Telephone Encounter (Signed)
Remote reviewed for 111/30/21 at 1130. Patient appears to be in SR at this time.

## 2020-07-21 NOTE — Telephone Encounter (Signed)
Patient called wondering if she maybe back in NSR - she is going to send transmission to see her rhythm.  Will call pt back once transmission results known.

## 2020-07-21 NOTE — Telephone Encounter (Signed)
Pt was notified of return to NSR confirmed and cardioversion was canceled. Keep scheduled follow up with Dr. Graciela Husbands 12/7. Pt verbalized agreement and understanding.

## 2020-07-25 ENCOUNTER — Other Ambulatory Visit (HOSPITAL_COMMUNITY): Payer: PPO

## 2020-07-27 ENCOUNTER — Encounter (HOSPITAL_COMMUNITY): Admission: RE | Payer: Self-pay | Source: Home / Self Care

## 2020-07-27 ENCOUNTER — Ambulatory Visit (HOSPITAL_COMMUNITY): Admission: RE | Admit: 2020-07-27 | Payer: PPO | Source: Home / Self Care | Admitting: Cardiology

## 2020-07-27 ENCOUNTER — Ambulatory Visit (INDEPENDENT_AMBULATORY_CARE_PROVIDER_SITE_OTHER): Payer: PPO

## 2020-07-27 DIAGNOSIS — I639 Cerebral infarction, unspecified: Secondary | ICD-10-CM

## 2020-07-27 SURGERY — CARDIOVERSION
Anesthesia: General

## 2020-07-28 ENCOUNTER — Encounter: Payer: Self-pay | Admitting: Internal Medicine

## 2020-07-28 ENCOUNTER — Other Ambulatory Visit: Payer: Self-pay

## 2020-07-28 ENCOUNTER — Ambulatory Visit (INDEPENDENT_AMBULATORY_CARE_PROVIDER_SITE_OTHER): Payer: PPO | Admitting: Internal Medicine

## 2020-07-28 VITALS — BP 140/98 | HR 60 | Ht 67.0 in | Wt 331.0 lb

## 2020-07-28 DIAGNOSIS — Z959 Presence of cardiac and vascular implant and graft, unspecified: Secondary | ICD-10-CM | POA: Diagnosis not present

## 2020-07-28 DIAGNOSIS — I4819 Other persistent atrial fibrillation: Secondary | ICD-10-CM

## 2020-07-28 DIAGNOSIS — I639 Cerebral infarction, unspecified: Secondary | ICD-10-CM | POA: Diagnosis not present

## 2020-07-28 DIAGNOSIS — Z79899 Other long term (current) drug therapy: Secondary | ICD-10-CM

## 2020-07-28 LAB — CUP PACEART INCLINIC DEVICE CHECK
Date Time Interrogation Session: 20211207111053
Implantable Pulse Generator Implant Date: 20190502

## 2020-07-28 LAB — PACEMAKER DEVICE OBSERVATION

## 2020-07-28 MED ORDER — LOSARTAN POTASSIUM 50 MG PO TABS: 50.0000 mg | ORAL_TABLET | Freq: Every day | ORAL | 6 refills | Status: DC

## 2020-07-28 MED ORDER — DILTIAZEM HCL 30 MG PO TABS
ORAL_TABLET | ORAL | 1 refills | Status: DC
Start: 2020-07-28 — End: 2021-10-25

## 2020-07-28 NOTE — Patient Instructions (Signed)
Medication Instructions:  - Your physician has recommended you make the following change in your medication:   1) STOP norvasc (amlodipine)  2) START cozaar (losartan) 50 mg- take 1 tablet by mouth once daily   3) Diltiazem 30 mg tablets have also been refilled to the pharmacy  *If you need a refill on your cardiac medications before your next appointment, please call your pharmacy*   Lab Work: - Your physician recommends that you return for lab work in: 2 weeks (around 08/11/20)- BMP  Come to the [facility] Entrance of [facility] 1st desk on the right to check in (past the screening table) Lab hours: Monday- Friday (7:30 am- 5:30 pm)   If you have labs (blood work) drawn today and your tests are completely normal, you will receive your results only by: Marland Kitchen MyChart Message (if you have MyChart) OR . A paper copy in the mail If you have any lab test that is abnormal or we need to change your treatment, we will call you to review the results.   Testing/Procedures: - none ordered   Follow-Up: At [facility] you and your health needs are our priority.  As part of our continuing mission to provide you with exceptional heart care, we have created designated Provider Care Teams.  These Care Teams include your primary Cardiologist (physician) and Advanced Practice Providers (APPs -  Physician Assistants and Nurse Practitioners) who all work together to provide you with the care you need, when you need it.  We recommend signing up for the patient portal called "MyChart".  Sign up information is provided on this After Visit Summary.  MyChart is used to connect with patients for Virtual Visits (Telemedicine).  Patients are able to view lab/test results, encounter notes, upcoming appointments, etc.  Non-urgent messages can be sent to your provider as well.   To learn more about what you can do with MyChart, go to [email or web address].    Your next appointment:   6 month(s)  The format for  your next appointment:   In Person  Provider:   Sherryl Manges, MD   Other Instructions  Losartan Tablets What is this medicine? LOSARTAN (loe SAR tan) is an angiotensin II receptor blocker, also known as an ARB. It treats high blood pressure. It can slow kidney damage in some patients. It may also be used to lower the risk of stroke. This medicine may be used for other purposes; ask your health care provider or pharmacist if you have questions. COMMON BRAND NAME(S): Cozaar What should I tell my health care provider before I take this medicine? They need to know if you have any of these conditions:  heart failure  kidney or liver disease  an unusual or allergic reaction to losartan, other medicines, foods, dyes, or preservatives  pregnant or trying to get pregnant  breast-feeding How should I use this medicine? Take this drug by mouth. Take it as directed on the prescription label at the same time every day. You can take it with or without food. If it upsets your stomach, take it with food. Keep taking it unless your health care provider tells you to stop. Talk to your health care provider about the use of this drug in children. While it may be prescribed for children as young as 6 for selected conditions, precautions do apply. Overdosage: If you think you have taken too much of this medicine contact a poison control center or emergency room at once. NOTE: This medicine is only for  you. Do not share this medicine with others. What if I miss a dose? If you miss a dose, take it as soon as you can. If it is almost time for your next dose, take only that dose. Do not take double or extra doses. What may interact with this medicine?  blood pressure medicines  diuretics, especially triamterene, spironolactone, or amiloride  fluconazole  NSAIDs, medicines for pain and inflammation, like ibuprofen or naproxen  potassium salts or potassium supplements  rifampin This list may not  describe all possible interactions. Give your health care provider a list of all the medicines, herbs, non-prescription drugs, or dietary supplements you use. Also tell them if you smoke, drink alcohol, or use illegal drugs. Some items may interact with your medicine. What should I watch for while using this medicine? Visit your doctor or health care professional for regular checks on your progress. Check your blood pressure as directed. Ask your doctor or health care professional what your blood pressure should be and when you should contact him or her. Call your doctor or health care professional if you notice an irregular or fast heart beat. Women should inform their doctor if they wish to become pregnant or think they might be pregnant. There is a potential for serious side effects to an unborn child, particularly in the second or third trimester. Talk to your health care professional or pharmacist for more information. You may get drowsy or dizzy. Do not drive, use machinery, or do anything that needs mental alertness until you know how this drug affects you. Do not stand or sit up quickly, especially if you are an older patient. This reduces the risk of dizzy or fainting spells. Alcohol can make you more drowsy and dizzy. Avoid alcoholic drinks. Avoid salt substitutes unless you are told otherwise by your doctor or health care professional. Do not treat yourself for coughs, colds, or pain while you are taking this medicine without asking your doctor or health care professional for advice. Some ingredients may increase your blood pressure. What side effects may I notice from receiving this medicine? Side effects that you should report to your doctor or health care professional as soon as possible:  confusion, dizziness, light headedness or fainting spells  decreased amount of urine passed  difficulty breathing or swallowing, hoarseness, or tightening of the throat  fast or irregular heart beat,  palpitations, or chest pain  skin rash, itching  swelling of your face, lips, tongue, hands, or feet Side effects that usually do not require medical attention (report to your doctor or health care professional if they continue or are bothersome):  cough  decreased sexual function or desire  headache  nasal congestion or stuffiness  nausea or stomach pain  sore or cramping muscles This list may not describe all possible side effects. Call your doctor for medical advice about side effects. You may report side effects to FDA at 1-800-FDA-1088. Where should I keep my medicine? Keep out of the reach of children and pets. Store at room temperature between 15 and 30 degrees C (59 and 86 degrees F). Protect from light. Keep the container tightly closed. Throw away any unused drug after the expiration date. NOTE: This sheet is a summary. It may not cover all possible information. If you have questions about this medicine, talk to your doctor, pharmacist, or health care provider.  2020 Elsevier/Gold Standard (2019-03-13 12:12:28)

## 2020-07-28 NOTE — Progress Notes (Signed)
Patient Care Team: [name], MD as PCP - General (Internal Medicine)   HPI  [name] is a 69 y.o. 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  Intercurrently has been diagnosed with atrial fibrillation.  Now on anticoagulation (apixaban) and is on [employer] patient support  Seen by DC-NP A. fib clinic 11/21.  Noted to be in A. Fib-symptomatic-- dyspnea and fear  Cardioversion dissipated but spontaneous reversion.  Weight continues to climb    Acknowledges depression.  She was tried on an antidepressant.  Took 1 pill and had palpitations and stopped.  She has been able to [name] 100 pounds or more on a couple of occasions in the past.  She says she desires to do it again.  We talked about the loneliness and the depression since her husband died  She was started on amlodipine for hypertension.  Since then has developed peripheral edema.   DATE TEST EF   4/19 Echo   65-70 %         Date Cr K Hgb  11/21 0.91 4.0 14.0                  Thromboembolic risk factors ( age -31, HTN-1, TIA/CVA-2, Gender-1) for a CHADSVASc Score of >=5   Past Medical History:  Diagnosis Date  . Arthritis   . CHF (congestive heart failure) (HCC)   . Essential hypertension 10/15/2015  . Heart murmur   . Hypertension   . Hypothyroidism 10/15/2015  . 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"    Past Surgical History:  Procedure Laterality Date  . CHOLECYSTECTOMY    . DILATION AND CURETTAGE OF UTERUS    . LOOP RECORDER INSERTION N/A 12/21/2017   Procedure: LOOP RECORDER INSERTION;  Surgeon: Duke Salvia, MD;  Location: [facility] INVASIVE CV LAB;  Service: Cardiovascular;  Laterality: N/A;  . OMENTECTOMY N/A 10/12/2015   Procedure: PARTIAL OMENTECTOMY;  Surgeon: Violeta Gelinas, MD;  Location: [facility] OR;  Service: General;  Laterality: N/A;  . TOTAL KNEE ARTHROPLASTY Left 04/04/2016   Procedure: LEFT TOTAL KNEE ARTHROPLASTY;   Surgeon: Ollen Gross, MD;  Location: [facility] ORS;  Service: Orthopedics;  Laterality: Left;  . TOTAL KNEE ARTHROPLASTY Right 08/01/2016   Procedure: TOTAL KNEE ARTHROPLASTY;  Surgeon: Ollen Gross, MD;  Location: [facility] ORS;  Service: Orthopedics;  Laterality: Right;  . UMBILICAL HERNIA REPAIR N/A 10/12/2015   Procedure: HERNIA REPAIR UMBILICAL ADULT/INCARERATED;  Surgeon: Violeta Gelinas, MD;  Location: [facility] OR;  Service: General;  Laterality: N/A;    Current Meds  Medication Sig  . acetaminophen (TYLENOL) 650 MG CR tablet Take 500 mg by mouth at bedtime.   Marland Kitchen amLODipine (NORVASC) 5 MG tablet Take 5 mg by mouth daily.  Marland Kitchen apixaban (ELIQUIS) 5 MG TABS tablet Take 1 tablet (5 mg total) by mouth 2 (two) times daily.  Marland Kitchen ascorbic acid (VITAMIN C) 500 MG tablet Take 500 mg by mouth daily.  Marland Kitchen diltiazem (CARDIZEM) 30 MG tablet Take 1 tablet every 4 hours AS NEEDED for heart rate >100 as long as blood pressure >100.  Marland Kitchen ELDERBERRY PO Take by mouth daily.   . furosemide (LASIX) 20 MG tablet Take 40 mg by mouth every morning.   Marland Kitchen levothyroxine (SYNTHROID, LEVOTHROID) 75 MCG tablet Take 75 mcg by mouth daily before breakfast.   . metoprolol (LOPRESSOR) 100 MG tablet Take 100 mg by mouth 2 (two) times daily.   Marland Kitchen  Vitamin D, Cholecalciferol, 1000 units TABS Take 2,000 Units by mouth daily.    Allergies  Allergen Reactions  . Augmentin [Amoxicillin-Pot Clavulanate] Hives and Itching    Has patient had a PCN reaction causing immediate rash, facial/tongue/throat swelling, SOB or lightheadedness with hypotension:No Has patient had a PCN reaction causing severe rash involving mucus membranes or skin necrosis:No Has patient had a PCN reaction that required hospitalization:No Has patient had a PCN reaction occurring within the last 10 years:No If all of the above answers are "NO", then may proceed with Cephalosporin use.   . Pneumococcal Vaccines Other (See Comments)    Caused fever, and swelling at injection site       Review of Systems negative except from HPI and PMH  Physical Exam   BP (!) 140/98 (BP Location: Left Arm, Patient Position: Sitting, Cuff Size: Normal)   Pulse 60   Ht 5\' 7"  (1.702 m)   Wt (!) 331 lb (150.1 kg)   SpO2 94%   BMI 51.84 kg/m  Well developed and Morbidly obese in no acute distress HENT normal Neck supple with JVP-flat Clear Regular rate and rhythm, no   murmur Abd-soft with active BS No Clubbing cyanosis tr edema Skin-warm and dry A & Oriented  Grossly normal sensory and motor function  ECG sinus at 60 Intervals 18/09/43  Assessment and  Plan  Splenic infarct  Atrial fibrillation-paroxysmal with a rapid rate  Sleep disordered breathing  Morbid obesity   Hypertension-whitecoat  Depression  Intermittent atrial fibrillation.  Quite symptomatic.  We discussed potential triggers including published relationships to obesity; atrial fibrillation is quite discombobulating and discussed strategies of antiarrhythmic therapy, both pill in the pocket as well as preventative therapy.  Frequency would inform this.  Currently her episodes have all been spontaneously terminating mostly within a day or 2.  The most recent episode of 2 weeks was an aberration although it portends longer episodes in the future and progression of her atrial fibrillation to more persistent.  Lengthy discussion regarding obesity and its relationship to her emotional status.  Have encouraged her to consider antidepressant therapy or perhaps grief counseling as her weight and its increasing is a major problem  Given her edema we will stop her amlodipine.  We will put her on losartan.  Have reviewed risks and we will check a metabolic profile in 2 weeks  Current medicines are reviewed at length with the patient today .  The patient does not have concerns regarding medicines.

## 2020-07-29 LAB — CUP PACEART REMOTE DEVICE CHECK
Date Time Interrogation Session: 20211204224236
Implantable Pulse Generator Implant Date: 20190502

## 2020-08-06 NOTE — Progress Notes (Signed)
Carelink Summary Report / Loop Recorder 

## 2020-08-12 ENCOUNTER — Other Ambulatory Visit: Payer: Self-pay

## 2020-08-12 ENCOUNTER — Other Ambulatory Visit
Admission: RE | Admit: 2020-08-12 | Discharge: 2020-08-12 | Disposition: A | Discharge status: Home / Self Care | Payer: PPO | Attending: Internal Medicine | Admitting: Internal Medicine

## 2020-08-12 DIAGNOSIS — Z959 Presence of cardiac and vascular implant and graft, unspecified: Secondary | ICD-10-CM | POA: Insufficient documentation

## 2020-08-12 DIAGNOSIS — Z79899 Other long term (current) drug therapy: Secondary | ICD-10-CM

## 2020-08-12 DIAGNOSIS — I4819 Other persistent atrial fibrillation: Secondary | ICD-10-CM | POA: Diagnosis not present

## 2020-08-12 LAB — BASIC METABOLIC PANEL
Anion gap: 9 (ref 5–15)
BUN: 25 mg/dL — ABNORMAL HIGH (ref 8–23)
CO2: 27 mmol/L (ref 22–32)
Calcium: 9 mg/dL (ref 8.9–10.3)
Chloride: 102 mmol/L (ref 98–111)
Creatinine, Ser: 0.84 mg/dL (ref 0.44–1.00)
GFR, Estimated: >60 mL/min (ref >60)
Glucose, Bld: 108 mg/dL — ABNORMAL HIGH (ref 70–99)
Potassium: 4.2 mmol/L (ref 3.5–5.1)
Sodium: 138 mmol/L (ref 135–145)

## 2020-08-31 LAB — CUP PACEART REMOTE DEVICE CHECK
Date Time Interrogation Session: 20220106224406
Implantable Pulse Generator Implant Date: 20190502

## 2020-09-02 ENCOUNTER — Ambulatory Visit
Admission: RE | Admit: 2020-09-02 | Discharge: 2020-09-02 | Disposition: A | Discharge status: Home / Self Care | Payer: PPO | Source: Ambulatory Visit | Attending: Internal Medicine | Admitting: Internal Medicine

## 2020-09-02 ENCOUNTER — Other Ambulatory Visit: Payer: Self-pay

## 2020-09-02 ENCOUNTER — Telehealth: Payer: Self-pay | Admitting: Internal Medicine

## 2020-09-02 DIAGNOSIS — Z1231 Encounter for screening mammogram for malignant neoplasm of breast: Secondary | ICD-10-CM | POA: Insufficient documentation

## 2020-09-02 NOTE — Telephone Encounter (Signed)
Error

## 2020-11-25 DIAGNOSIS — Z0000 Encounter for general adult medical examination without abnormal findings: Secondary | ICD-10-CM | POA: Diagnosis not present

## 2020-11-25 DIAGNOSIS — E039 Hypothyroidism, unspecified: Secondary | ICD-10-CM | POA: Diagnosis not present

## 2020-11-25 DIAGNOSIS — Z7901 Long term (current) use of anticoagulants: Secondary | ICD-10-CM | POA: Diagnosis not present

## 2020-11-25 DIAGNOSIS — E559 Vitamin D deficiency, unspecified: Secondary | ICD-10-CM | POA: Diagnosis not present

## 2020-11-25 DIAGNOSIS — M8580 Other specified disorders of bone density and structure, unspecified site: Secondary | ICD-10-CM | POA: Diagnosis not present

## 2020-11-30 DIAGNOSIS — E559 Vitamin D deficiency, unspecified: Secondary | ICD-10-CM | POA: Diagnosis not present

## 2020-11-30 DIAGNOSIS — E875 Hyperkalemia: Secondary | ICD-10-CM | POA: Diagnosis not present

## 2020-11-30 DIAGNOSIS — F329 Major depressive disorder, single episode, unspecified: Secondary | ICD-10-CM | POA: Diagnosis not present

## 2020-11-30 DIAGNOSIS — Z6841 Body Mass Index (BMI) 40.0 and over, adult: Secondary | ICD-10-CM | POA: Diagnosis not present

## 2020-11-30 DIAGNOSIS — Z7901 Long term (current) use of anticoagulants: Secondary | ICD-10-CM | POA: Diagnosis not present

## 2020-11-30 DIAGNOSIS — I480 Paroxysmal atrial fibrillation: Secondary | ICD-10-CM | POA: Diagnosis not present

## 2020-11-30 DIAGNOSIS — E039 Hypothyroidism, unspecified: Secondary | ICD-10-CM | POA: Diagnosis not present

## 2020-11-30 DIAGNOSIS — I10 Essential (primary) hypertension: Secondary | ICD-10-CM | POA: Diagnosis not present

## 2020-12-15 DIAGNOSIS — F325 Major depressive disorder, single episode, in full remission: Secondary | ICD-10-CM | POA: Diagnosis not present

## 2021-01-11 ENCOUNTER — Other Ambulatory Visit: Payer: Self-pay | Admitting: Internal Medicine

## 2021-01-11 NOTE — Telephone Encounter (Signed)
Please review for refill, Thanks !  

## 2021-01-11 NOTE — Telephone Encounter (Signed)
33f. 150.1kg, scr 0.83 05/25/20, lovw/klein 07/28/20

## 2021-02-02 ENCOUNTER — Other Ambulatory Visit: Payer: Self-pay

## 2021-02-02 ENCOUNTER — Ambulatory Visit (INDEPENDENT_AMBULATORY_CARE_PROVIDER_SITE_OTHER): Payer: PPO | Admitting: Internal Medicine

## 2021-02-02 ENCOUNTER — Encounter: Payer: Self-pay | Admitting: Internal Medicine

## 2021-02-02 VITALS — BP 132/68 | HR 97 | Ht 67.0 in | Wt 336.0 lb

## 2021-02-02 DIAGNOSIS — D735 Infarction of spleen: Secondary | ICD-10-CM

## 2021-02-02 DIAGNOSIS — I480 Paroxysmal atrial fibrillation: Secondary | ICD-10-CM | POA: Diagnosis not present

## 2021-02-02 DIAGNOSIS — Z79899 Other long term (current) drug therapy: Secondary | ICD-10-CM | POA: Diagnosis not present

## 2021-02-02 DIAGNOSIS — Z959 Presence of cardiac and vascular implant and graft, unspecified: Secondary | ICD-10-CM

## 2021-02-02 MED ORDER — LOSARTAN POTASSIUM 25 MG PO TABS: 25.0000 mg | ORAL_TABLET | Freq: Every day | ORAL | 1 refills | Status: DC

## 2021-02-02 MED ORDER — LOSARTAN POTASSIUM 50 MG PO TABS: 50.0000 mg | ORAL_TABLET | Freq: Every day | ORAL | 3 refills | Status: DC

## 2021-02-02 MED ORDER — FLECAINIDE ACETATE 100 MG PO TABS: 100.0000 mg | ORAL_TABLET | Freq: Two times a day (BID) | ORAL | 6 refills | Status: DC

## 2021-02-02 MED ORDER — DILTIAZEM HCL ER COATED BEADS 120 MG PO CP24: 120.0000 mg | ORAL_CAPSULE | Freq: Every day | ORAL | 1 refills | Status: DC

## 2021-02-02 NOTE — Progress Notes (Signed)
Patient ID: [name], female   DOB: October 09, 1950, 70 y.o.   MRN: [id number]       Patient Care Team: [name], MD as PCP - General (Internal Medicine)   HPI  [name] is a 70 y.o. 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  Intercurrently has been diagnosed with atrial fibrillation.  Now on anticoagulation (apixaban) and is on [employer] patient support  Seen by DC-NP A. fib clinic 11/21.  Noted to be in A. Fib-symptomatic-- dyspnea  >>  spontaneous reversion.  Atrial fibrillation has become problematic.  She is sometimes aware and sometimes not.  Dyspnea associated with more palpitations i.e. presumably faster rates provoked by any physical activities < 1 flight of stairs, <100 yds.   palpitations       The patient denies chest pain, nocturnal dyspnea, orthopnea or peripheral edema.  There have been no lightheadedness or syncope.   Experiencing Dyspnea assoc with  exertion,   2019 a sleep study was done it went well; no cpap  Checks BP at home systolic ranges 120-130 and diastolic ranges 70-80   Started on an antidepressant and doing better  DATE TEST EF   4/19 Echo   65-70 % LA size ULN 102 and then yet were not reviewed due to her allergies and always she would have occasional and lisinopril flecainide 100 twice daily and then cardiovert and she has a Micra cardioversion with unweighted reset that up February does not consecutive        Date Cr K Hgb  11/21 0.91 4.0 14.0   12/21 0.84 4.2   4/22   0.83 5.4 13.3     Thromboembolic risk factors ( age -16, HTN-1, TIA/CVA-2, Gender-1) for a CHADSVASc Score of >=5  Past Medical History:  Diagnosis Date   Arthritis    CHF (congestive heart failure) (HCC)    Essential hypertension 10/15/2015   Heart murmur    Hypertension    Hypothyroidism 10/15/2015   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"    Past Surgical History:  Procedure  Laterality Date   CHOLECYSTECTOMY     DILATION AND CURETTAGE OF UTERUS     LOOP RECORDER INSERTION N/A 12/21/2017   Procedure: LOOP RECORDER INSERTION;  Surgeon: Duke Salvia, MD;  Location: [facility] INVASIVE CV LAB;  Service: Cardiovascular;  Laterality: N/A;   OMENTECTOMY N/A 10/12/2015   Procedure: PARTIAL OMENTECTOMY;  Surgeon: Violeta Gelinas, MD;  Location: [facility] OR;  Service: General;  Laterality: N/A;   TOTAL KNEE ARTHROPLASTY Left 04/04/2016   Procedure: LEFT TOTAL KNEE ARTHROPLASTY;  Surgeon: Ollen Gross, MD;  Location: [facility] ORS;  Service: Orthopedics;  Laterality: Left;   TOTAL KNEE ARTHROPLASTY Right 08/01/2016   Procedure: TOTAL KNEE ARTHROPLASTY;  Surgeon: Ollen Gross, MD;  Location: [facility] OR[facility];  Service: Orthopedics;  Laterality: Right;   UMBILICAL HERNIA REPAIR N/A 10/12/2015   Procedure: HERNIA REPAIR UMBILICAL ADULT/INCARERATED;  Surgeon: Violeta Gelinas, MD;  Location: [facility] OR;  Service: General;  Laterality: N/A;    Current Meds  Medication Sig   acetaminophen (TYLENOL) 650 MG CR tablet Take 500 mg by mouth at bedtime.    ascorbic acid (VITAMIN C) 500 MG tablet Take 500 mg by mouth daily.   diltiazem (CARDIZEM) 30 MG tablet Take 1 tablet every 4 hours AS NEEDED for heart rate >100 as long as blood pressure >100.   ELDERBERRY PO Take by mouth daily.  ELIQUIS 5 MG TABS tablet TAKE 1 TABLET BY MOUTH 2 TIMES DAILY.   escitalopram (LEXAPRO) 5 MG tablet Take 5 mg by mouth daily.   furosemide (LASIX) 20 MG tablet Take 40 mg by mouth every morning.    levothyroxine (SYNTHROID, LEVOTHROID) 75 MCG tablet Take 75 mcg by mouth daily before breakfast.    metoprolol (LOPRESSOR) 100 MG tablet Take 100 mg by mouth 2 (two) times daily.    Vitamin D, Cholecalciferol, 1000 units TABS Take 2,000 Units by mouth daily.    Allergies  Allergen Reactions   Augmentin [Amoxicillin-Pot Clavulanate] Hives and Itching    Has patient had a PCN reaction causing immediate rash, facial/tongue/throat swelling,  SOB or lightheadedness with hypotension:No Has patient had a PCN reaction causing severe rash involving mucus membranes or skin necrosis:No Has patient had a PCN reaction that required hospitalization:No Has patient had a PCN reaction occurring within the last 10 years:No If all of the above answers are "NO", then may proceed with Cephalosporin use.    Pneumococcal Vaccines Other (See Comments)    Caused fever, and swelling at injection site   Review of Systems negative except from HPI and PMH  Physical Exam BP 132/68   Pulse 97   Ht 5\' 7"  (1.702 m)   Wt (!) 336 lb (152.4 kg)   BMI 52.63 kg/m  Well developed and Morbidly obese in no acute distress HENT normal Neck supple with JVP-flat Carotids brisk and full without bruits Clear Irregularly irregular rate and rhythm with controlled ventricular response, no murmurs or gallops Abd-soft with active BS without hepatomegaly No Clubbing cyanosis edema Skin-warm and dry A & Oriented  Grossly normal sensory and motor function   ECG atiral fib @ 97 -/10/32   ECG sinus at 60 Intervals 18/09/43  Assessment and  Plan  Splenic infarct  Atrial fibrillation-persistent   Sleep disordered breathing  Morbid obesity   Hypertension-whitecoat  Depression  Atrial fibrillation is variably symptomatic.  At this juncture we will plan to try to restore sinus rhythm.  From her persistent atrial fibrillation.  Last left atrial assessment was a couple of years ago; we have discussed rate controlling strategy versus rhythm control used energies and will attempt the latter.  She remains quite anxious about cardioversion.  We have discussed its risks and benefits.  We will begin her on flecainide 100 mg twice daily continuing on her Lopressor.  We will continue her Eliquis 5 mg twice a day; she is having no bleeding.  Blood pressures at home have been reasonably controlled.  We will continue her on her losartan 50 mg twice daily and her  metoprolol 100 twice daily. Her depression is better. *  I,Stephanie Williams,acting as a 20/09/43 for Neurosurgeon, MD.,have documented all relevant documentation on the behalf of Sherryl Manges, MD,as directed by  Sherryl Manges, MD while in the presence of Sherryl Manges, MD.  I, Sherryl Manges, MD, have reviewed all documentation for this visit. The documentation on 02/02/21 for the exam, diagnosis, procedures, and orders are all accurate and complete.

## 2021-02-02 NOTE — Patient Instructions (Addendum)
Medication Instructions:  - Your physician has recommended you make the following change in your medication:   1) START flecainide 100 mg- take 1 tablet by mouth TWICE daily    *If you need a refill on your cardiac medications before your next appointment, please call your pharmacy*   Lab Work: - Your physician recommends that you have lab work today: BMP/ CBC  If you have labs (blood work) drawn today and your tests are completely normal, you will receive your results only by: MyChart Message (if you have MyChart) OR A paper copy in the mail If you have any lab test that is abnormal or we need to change your treatment, we will call you to review the results.   Testing/Procedures: - Your physician has recommended that you have a Cardioversion (DCCV). Electrical Cardioversion uses a jolt of electricity to your heart either through paddles or wired patches attached to your chest. This is a controlled, usually prescheduled, procedure. Defibrillation is done under light anesthesia in the hospital, and you usually go home the day of the procedure. This is done to get your heart back into a normal rhythm. You are not awake for the procedure.   You are scheduled for a Cardioversion on Tuesday 02/16/21 with Dr. Graciela Husbands.  [facility] at 6:30 a.m. on the day of your procedure.  DIET INSTRUCTIONS:  Nothing to eat or drink after midnight the night prior to your procedure.          Labs: as above  Medications:  YOU MAY TAKE ALL of your medications the morning of your procedure with enough water to get them down safely unless listed below:  - HOLD furosemide the morning of your procedure  Must have a responsible person to drive you home.  Bring a current list of your medications and current insurance cards.    If you have any questions after you get home, please call the office at [phone]   Follow-Up: At [facility], you and your health needs are our  priority.  As part of our continuing mission to provide you with exceptional heart care, we have created designated Provider Care Teams.  These Care Teams include your primary Cardiologist (physician) and Advanced Practice Providers (APPs -  Physician Assistants and Nurse Practitioners) who all work together to provide you with the care you need, when you need it.  We recommend signing up for the patient portal called "MyChart".  Sign up information is provided on this After Visit Summary.  MyChart is used to connect with patients for Virtual Visits (Telemedicine).  Patients are able to view lab/test results, encounter notes, upcoming appointments, etc.  Non-urgent messages can be sent to your provider as well.   To learn more about what you can do with MyChart, go to [email or web address].    Your next appointment:   4-5 week(s)  The format for your next appointment:   In Person  Provider:   Sherryl Manges, MD   Other Instructions    Flecainide Tablets What is this medication? FLECAINIDE (FLEK a nide) prevents and treats a fast or irregular heartbeat (arrhythmia). It is often used to treat a type of arrhythmia known as AFib (atrial fibrillation). It works by slowing down overactive electric signals in the heart, which stabilizes your heart rhythm. It belongs to a group ofmedications called antiarrhythmics. This medicine may be used for other purposes; ask your health care provider orpharmacist if you have questions. COMMON BRAND  NAME(S): Tambocor What should I tell my care team before I take this medication? They need to know if you have any of these conditions: Abnormal levels of potassium in the blood Heart disease including heart rhythm and heart rate problems Kidney or liver disease Recent heart attack An unusual or allergic reaction to flecainide, local anesthetics, other medications, foods, dyes, or preservatives Pregnant or trying to get pregnant Breast-feeding How should I  use this medication? Take this medication by mouth with a glass of water. Follow the directions on the prescription label. You can take this medication with or without food. Take your doses at regular intervals. Do not take your medication more often than directed. Do not stop taking this medication suddenly. This may cause serious, heart-related side effects. If your care team wants you to stop the medication,the dose may be slowly lowered over time to avoid any side effects. Talk to your care team regarding the use of this medication in children. While this medication may be prescribed for children as young as 1 year of age forselected conditions, precautions do apply. Overdosage: If you think you have taken too much of this medicine contact apoison control center or emergency room at once. NOTE: This medicine is only for you. Do not share this medicine with others. What if I miss a dose? If you miss a dose, take it as soon as you can. If it is almost time for yournext dose, take only that dose. Do not take double or extra doses. What may interact with this medication? Do not take this medication with any of the following: Amoxapine Arsenic trioxide Certain antibiotics like clarithromycin, erythromycin, gatifloxacin, gemifloxacin, levofloxacin, moxifloxacin, sparfloxacin, or troleandomycin Certain antidepressants called tricyclic antidepressants like amitriptyline, imipramine, or nortriptyline Certain medications to control heart rhythm like disopyramide, encainide, moricizine, procainamide, propafenone, and quinidine Cisapride Delavirdine Droperidol Haloperidol [place] Imatinib Levomethadyl Maprotiline Medications for malaria like chloroquine and halofantrine Pentamidine Phenothiazines like chlorpromazine, mesoridazine, prochlorperazine, thioridazine Pimozide Quinine Ranolazine Ritonavir Sertindole This medication may also interact with the  following: Cimetidine Dofetilide Medications for angina or high blood pressure Medications to control heart rhythm like amiodarone and digoxin Ziprasidone This list may not describe all possible interactions. Give your health care provider a list of all the medicines, herbs, non-prescription drugs, or dietary supplements you use. Also tell them if you smoke, drink alcohol, or use illegaldrugs. Some items may interact with your medicine. What should I watch for while using this medication? Visit your care team for regular checks on your progress. Because your condition and the use of this medication carries some risk, it is a good idea to carry an identification card, necklace or bracelet with details of yourcondition, medications, and care team. Check your blood pressure and pulse rate regularly. Ask your care team what your blood pressure and pulse rate should be, and when you should contact them. Your care team also may schedule regular blood tests and electrocardiograms tocheck your progress. You may get drowsy or dizzy. Do not drive, use machinery, or do anything that needs mental alertness until you know how this medication affects you. Do not stand or sit up quickly, especially if you are an older patient. This reduces the risk of dizzy or fainting spells. Alcohol can make you more dizzy, increaseflushing and rapid heartbeats. Avoid alcoholic drinks. What side effects may I notice from receiving this medication? Side effects that you should report to your care team as soon as possible: Allergic reactions-skin rash, itching, hives, swelling of  the face, lips, tongue, or throat Heart failure-shortness of breath, swelling of the ankles, feet, or hands, sudden weight gain, unusual weakness or fatigue Heart rhythm changes-fast or irregular heartbeat, dizziness, feeling faint or lightheaded, chest pain, trouble breathing Liver injury-right upper belly pain, loss of appetite, nausea, light-colored  stool, dark yellow or brown urine, yellowing skin or eyes, unusual weakness or fatigue Side effects that usually do not require medical attention (report to your careteam if they continue or are bothersome): Blurry vision Constipation Dizziness Fatigue Headache Nausea Tremors or shaking This list may not describe all possible side effects. Call your doctor for medical advice about side effects. You may report side effects to FDA at1-800-FDA-1088. Where should I keep my medication? Keep out of the reach of children and pets. Store at room temperature between 15 and 30 degrees C (59 and 86 degrees F). Protect from light. Keep container tightly closed. Throw away any unusedmedication after the expiration date. NOTE: This sheet is a summary. It may not cover all possible information. If you have questions about this medicine, talk to your doctor, pharmacist, orhealth care provider.  2022 Elsevier/Gold Standard (2020-09-10 12:17:39)

## 2021-02-03 LAB — CBC WITH DIFFERENTIAL/PLATELET
Basophils Absolute: 0.1 10*3/uL (ref 0.0–0.2)
Basos: 1 %
EOS (ABSOLUTE): 0.3 10*3/uL (ref 0.0–0.4)
Eos: 3 %
Hematocrit: 42.5 % (ref 34.0–46.6)
Hemoglobin: 13.9 g/dL (ref 11.1–15.9)
Immature Grans (Abs): 0 10*3/uL (ref 0.0–0.1)
Immature Granulocytes: 0 %
Lymphocytes Absolute: 1.6 10*3/uL (ref 0.7–3.1)
Lymphs: 18 %
MCH: 28.9 pg (ref 26.6–33.0)
MCHC: 32.7 g/dL (ref 31.5–35.7)
MCV: 88 fL (ref 79–97)
Monocytes Absolute: 0.6 10*3/uL (ref 0.1–0.9)
Monocytes: 6 %
Neutrophils Absolute: 6.5 10*3/uL (ref 1.4–7.0)
Neutrophils: 72 %
Platelets: 253 10*3/uL (ref 150–450)
RBC: 4.81 x10E6/uL (ref 3.77–5.28)
RDW: 13.6 % (ref 11.7–15.4)
WBC: 9.1 10*3/uL (ref 3.4–10.8)

## 2021-02-03 LAB — BASIC METABOLIC PANEL
BUN/Creatinine Ratio: 27 (ref 12–28)
BUN: 25 mg/dL (ref 8–27)
CO2: 25 mmol/L (ref 20–29)
Calcium: 9.3 mg/dL (ref 8.7–10.3)
Chloride: 98 mmol/L (ref 96–106)
Creatinine, Ser: 0.93 mg/dL (ref 0.57–1.00)
Glucose: 101 mg/dL — ABNORMAL HIGH (ref 65–99)
Potassium: 4.9 mmol/L (ref 3.5–5.2)
Sodium: 142 mmol/L (ref 134–144)
eGFR: 65 mL/min/{1.73_m2} (ref >59)

## 2021-02-04 ENCOUNTER — Other Ambulatory Visit: Payer: Self-pay | Admitting: Internal Medicine

## 2021-02-12 ENCOUNTER — Telehealth: Payer: Self-pay | Admitting: Internal Medicine

## 2021-02-12 MED ORDER — FLECAINIDE ACETATE 100 MG PO TABS: 50.0000 mg | ORAL_TABLET | Freq: Two times a day (BID) | ORAL | 6 refills | Status: DC

## 2021-02-12 NOTE — Telephone Encounter (Signed)
I have reviewed the patient's message below with Dr. Graciela Husbands. Per Dr. Graciela Husbands, ok to decrease flecainide to 50 mg BID.  I have called and notified the patient. She advised that her HR's are running in the 50's some. She voices understanding to decrease her flecainide dose, but to make sure she taking flecainide 100 mg- 0.5 tablet (50 mg) BID instead of taking flecainide 100 mg QD.

## 2021-02-12 NOTE — Telephone Encounter (Signed)
Pt c/o medication issue:  1. Name of Medication: flecainide (TAMBOCOR) 100 MG tablet  2. How are you currently taking this medication (dosage and times per day)? 1 table by mouth 2 times a day  3. Are you having a reaction (difficulty breathing--STAT)? no  4. What is your medication issue? Patient states she feel space out and loopy when she takes it and sleeps for hrs. Patient want to know can she just do half.

## 2021-02-16 ENCOUNTER — Encounter: Payer: Self-pay | Admitting: Anesthesiology

## 2021-02-16 ENCOUNTER — Encounter: Payer: Self-pay | Admitting: Internal Medicine

## 2021-02-16 ENCOUNTER — Encounter
Admission: RE | Disposition: A | Discharge status: Home / Self Care | Payer: Self-pay | Source: Home / Self Care | Attending: Internal Medicine

## 2021-02-16 ENCOUNTER — Ambulatory Visit
Admission: RE | Admit: 2021-02-16 | Discharge: 2021-02-16 | Disposition: A | Discharge status: Home / Self Care | Payer: PPO | Attending: Internal Medicine | Admitting: Internal Medicine

## 2021-02-16 DIAGNOSIS — I447 Left bundle-branch block, unspecified: Secondary | ICD-10-CM | POA: Insufficient documentation

## 2021-02-16 DIAGNOSIS — I4891 Unspecified atrial fibrillation: Secondary | ICD-10-CM | POA: Diagnosis not present

## 2021-02-16 DIAGNOSIS — Z538 Procedure and treatment not carried out for other reasons: Secondary | ICD-10-CM | POA: Insufficient documentation

## 2021-02-16 HISTORY — PX: CARDIOVERSION: SHX1299

## 2021-02-16 SURGERY — CARDIOVERSION
Anesthesia: General

## 2021-02-16 NOTE — Progress Notes (Signed)
Patient arrived and EKG done and shows sinus rhythm.  Contacted Dr. Duke Salvia and, after review, patient discharged without needing cardioversion.  Patient states she has follow up already scheduled in July with Cardiology.

## 2021-03-11 ENCOUNTER — Other Ambulatory Visit: Payer: Self-pay

## 2021-03-11 ENCOUNTER — Encounter: Payer: Self-pay | Admitting: Internal Medicine

## 2021-03-11 ENCOUNTER — Ambulatory Visit (INDEPENDENT_AMBULATORY_CARE_PROVIDER_SITE_OTHER): Payer: PPO | Admitting: Internal Medicine

## 2021-03-11 VITALS — BP 182/84 | HR 57 | Ht 67.0 in | Wt 334.0 lb

## 2021-03-11 DIAGNOSIS — Z959 Presence of cardiac and vascular implant and graft, unspecified: Secondary | ICD-10-CM

## 2021-03-11 DIAGNOSIS — I480 Paroxysmal atrial fibrillation: Secondary | ICD-10-CM

## 2021-03-11 NOTE — Progress Notes (Signed)
Patient ID: [name], female   DOB: [birth year], 70 y.o.   MRN: [id number]       Patient Care Team: [name], MD as PCP - General (Internal Medicine)   HPI  ASHLEYNICOLE MCCLEES is a 70 y.o. 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  Intercurrently  diagnosed with atrial fibrillation with recurrences.  Associated with dyspnea.  She was scheduled for cardioversion and reverted spontaneously (6/22)  Now on anticoagulation (apixaban) and is on [employer] patient support  Seen by DC-NP A. fib clinic 11/21.  Noted to be in A. Fib-symptomatic-- dyspnea  >>  spontaneous reversion. She is less short of breath with sinus rhythm.  Initially flecainide was associated with "spacey "this is resolved.  Also has problem with headache.  No head trauma.  Did fall out of bed but fell onto her bottom on the stool beside her bed (she is not able to get into bed without a stool)   No bleeding  2019 negative sleep study  Blood pressures at home are in the 110-120 range  DATE TEST EF   4/19 Echo   65-70 % LA size ULN          Date Cr K Hgb  11/21 0.91 4.0 14.0   12/21 0.84 4.2   4/22   0.83 5.4 13.3   DATE PR interval QRSduration Dose  12/21  184 118 0  7/22 188 120 100     Thromboembolic risk factors ( age -13, HTN-1, TIA/CVA-2, Gender-1) for a CHADSVASc Score of >=5  Past Medical History:  Diagnosis Date   Arthritis    CHF (congestive heart failure) (HCC)    Essential hypertension 10/15/2015   Heart murmur    Hypertension    Hypothyroidism 10/15/2015   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"    Past Surgical History:  Procedure Laterality Date   CARDIOVERSION N/A 02/16/2021   Procedure: CARDIOVERSION;  Surgeon: Duke Salvia, MD;  Location: [facility] ORS;  Service: Cardiovascular;  Laterality: N/A;   CHOLECYSTECTOMY     DILATION AND CURETTAGE OF UTERUS     LOOP RECORDER INSERTION N/A 12/21/2017   Procedure: LOOP  RECORDER INSERTION;  Surgeon: Duke Salvia, MD;  Location: [facility] INVASIVE CV LAB;  Service: Cardiovascular;  Laterality: N/A;   OMENTECTOMY N/A 10/12/2015   Procedure: PARTIAL OMENTECTOMY;  Surgeon: Violeta Gelinas, MD;  Location: [facility];  Service: General;  Laterality: N/A;   TOTAL KNEE ARTHROPLASTY Left 04/04/2016   Procedure: LEFT TOTAL KNEE ARTHROPLASTY;  Surgeon: Ollen Gross, MD;  Location: [facility];  Service: Orthopedics;  Laterality: Left;   TOTAL KNEE ARTHROPLASTY Right 08/01/2016   Procedure: TOTAL KNEE ARTHROPLASTY;  Surgeon: Ollen Gross, MD;  Location: [facility];  Service: Orthopedics;  Laterality: Right;   UMBILICAL HERNIA REPAIR N/A 10/12/2015   Procedure: HERNIA REPAIR UMBILICAL ADULT/INCARERATED;  Surgeon: Violeta Gelinas, MD;  Location: [facility] OR;  Service: General;  Laterality: N/A;    Current Meds  Medication Sig   acetaminophen (TYLENOL) 500 MG tablet Take 1,000 mg by mouth at bedtime.   ascorbic acid (VITAMIN C) 500 MG tablet Take 500 mg by mouth daily.   diltiazem (CARDIZEM) 30 MG tablet Take 1 tablet every 4 hours AS NEEDED for heart rate >100 as long as blood pressure >100.   ELIQUIS 5 MG TABS tablet TAKE 1 TABLET BY MOUTH 2 TIMES DAILY.   escitalopram (LEXAPRO) 5 MG tablet Take  5 mg by mouth daily.   flecainide (TAMBOCOR) 100 MG tablet Take 100 mg by mouth 2 (two) times daily.   furosemide (LASIX) 20 MG tablet Take 40 mg by mouth every morning.    levothyroxine (SYNTHROID, LEVOTHROID) 75 MCG tablet Take 75 mcg by mouth daily before breakfast.    losartan (COZAAR) 50 MG tablet Take 1 tablet (50 mg total) by mouth daily.   metoprolol (LOPRESSOR) 100 MG tablet Take 100 mg by mouth 2 (two) times daily.    Vitamin D, Cholecalciferol, 1000 units TABS Take 2,000 Units by mouth daily.    Allergies  Allergen Reactions   Augmentin [Amoxicillin-Pot Clavulanate] Hives and Itching    Has patient had a PCN reaction causing immediate rash, facial/tongue/throat swelling, SOB or  lightheadedness with hypotension:No Has patient had a PCN reaction causing severe rash involving mucus membranes or skin necrosis:No Has patient had a PCN reaction that required hospitalization:No Has patient had a PCN reaction occurring within the last 10 years:No If all of the above answers are "NO", then may proceed with Cephalosporin use.    Pneumococcal Vaccines Other (See Comments)    Caused fever, and swelling at injection site   Review of Systems negative except from HPI and PMH  Physical Exam BP (!) 182/84 (BP Location: Left Arm, Patient Position: Sitting, Cuff Size: Large) Comment: Used automatic BP machine, patient would not let me do it manually  Pulse (!) 57   Ht 5\' 7"  (1.702 m)   Wt (!) 334 lb (151.5 kg)   SpO2 94%   BMI 52.31 kg/m  Well developed and Morbidly obese  in no acute distress HENT normal Neck supple  Clear Regular rate and rhythm, no murmurs or gallops Abd-soft with active BS No Clubbing cyanosis 2+ edema Skin-warm and dry A & Oriented  Grossly normal sensory and motor function  ECG sinus at 57 with marked PP variability Intervals 19/12/47  Assessment and  Plan  Splenic infarct  Atrial fibrillation-persistent   Sleep disordered breathing  Morbid obesity   Hypertension-whitecoat  Depression  Atrial fibrillation is quiescient, currently holding sinus rhythm on flecainide.  We will continue to 100 mg twice daily.  QRS/PR interval prolongation not noted on the current dose of flecainide.  Continue Eliquis at 5 mg twice daily.  No significant bleeding  Blood pressure remains well controlled at home although very high here.  Continue metoprolol 100 twice daily, losartan 50 daily  We have discussed the physiology of heart failure including the importance of salt restriction and fluid restriction and have reviewed sources of dietary salt and water.  She is voluminous in her fluid intake.  Encouraged her to decrease her intake.  We will also increase  her furosemide from 40 daily--80 daily x3 days and then resume at 40 mg a day.  Encouraged exercise-CUBII    *

## 2021-03-11 NOTE — Patient Instructions (Signed)
Medication Instructions:  Your physician has recommended you make the following change in your medication:   ** Please increase your Lasix to 80mg  (4-20mg  tablets) x 3 days then resume your normal dosing.  *If you need a refill on your cardiac medications before your next appointment, please call your pharmacy*   Lab Work: None ordered.  If you have labs (blood work) drawn today and your tests are completely normal, you will receive your results only by: MyChart Message (if you have MyChart) OR A paper copy in the mail If you have any lab test that is abnormal or we need to change your treatment, we will call you to review the results.   Testing/Procedures: None ordered.    Follow-Up: At [facility], you and your health needs are our priority.  As part of our continuing mission to provide you with exceptional heart care, we have created designated Provider Care Teams.  These Care Teams include your primary Cardiologist (physician) and Advanced Practice Providers (APPs -  Physician Assistants and Nurse Practitioners) who all work together to provide you with the care you need, when you need it.  We recommend signing up for the patient portal called "MyChart".  Sign up information is provided on this After Visit Summary.  MyChart is used to connect with patients for Virtual Visits (Telemedicine).  Patients are able to view lab/test results, encounter notes, upcoming appointments, etc.  Non-urgent messages can be sent to your provider as well.   To learn more about what you can do with MyChart, go to [facility].    Your next appointment:   6 month(s)  The format for your next appointment:   In Person  Provider:   [email or web address], MD

## 2021-03-21 DIAGNOSIS — I130 Hypertensive heart and chronic kidney disease with heart failure and stage 1 through stage 4 chronic kidney disease, or unspecified chronic kidney disease: Secondary | ICD-10-CM | POA: Diagnosis not present

## 2021-03-21 DIAGNOSIS — N182 Chronic kidney disease, stage 2 (mild): Secondary | ICD-10-CM | POA: Diagnosis not present

## 2021-03-21 DIAGNOSIS — E039 Hypothyroidism, unspecified: Secondary | ICD-10-CM | POA: Diagnosis not present

## 2021-03-21 DIAGNOSIS — I5032 Chronic diastolic (congestive) heart failure: Secondary | ICD-10-CM | POA: Diagnosis not present

## 2021-04-21 DIAGNOSIS — I130 Hypertensive heart and chronic kidney disease with heart failure and stage 1 through stage 4 chronic kidney disease, or unspecified chronic kidney disease: Secondary | ICD-10-CM | POA: Diagnosis not present

## 2021-04-21 DIAGNOSIS — I5032 Chronic diastolic (congestive) heart failure: Secondary | ICD-10-CM | POA: Diagnosis not present

## 2021-04-21 DIAGNOSIS — N182 Chronic kidney disease, stage 2 (mild): Secondary | ICD-10-CM | POA: Diagnosis not present

## 2021-04-21 DIAGNOSIS — E039 Hypothyroidism, unspecified: Secondary | ICD-10-CM | POA: Diagnosis not present

## 2021-05-17 ENCOUNTER — Encounter: Payer: Self-pay | Admitting: Emergency Medicine

## 2021-05-17 ENCOUNTER — Other Ambulatory Visit: Payer: Self-pay

## 2021-05-17 ENCOUNTER — Ambulatory Visit
Admission: EM | Admit: 2021-05-17 | Discharge: 2021-05-17 | Disposition: A | Discharge status: Home / Self Care | Payer: PPO

## 2021-05-17 DIAGNOSIS — T148XXA Other injury of unspecified body region, initial encounter: Secondary | ICD-10-CM | POA: Diagnosis not present

## 2021-05-17 NOTE — ED Provider Notes (Signed)
[facility]   MRN: [id number] DOB: September 22, 1950  Subjective:   [name] is a 70 y.o. female presenting for a wound check.  Patient bumped her right forearm against a screen door 4 days ago.  She had some bruising and swelling at the time.  Has been doing wound care using Dial antibacterial soap, cleaning her wound 3 times daily and keeping it covered at night.  She is also applying mupirocin antibiotic ointment.  Denies fever, warmth, redness, drainage of pus or bleeding.  Patient wanted to make sure that she did not have an infection.  No current facility-administered medications for this encounter.  Current Outpatient Medications:    acetaminophen (TYLENOL) 500 MG tablet, Take 1,000 mg by mouth at bedtime., Disp: , Rfl:    ascorbic acid (VITAMIN C) 500 MG tablet, Take 500 mg by mouth daily., Disp: , Rfl:    diltiazem (CARDIZEM) 30 MG tablet, Take 1 tablet every 4 hours AS NEEDED for heart rate >100 as long as blood pressure >100., Disp: 45 tablet, Rfl: 1   ELIQUIS 5 MG TABS tablet, TAKE 1 TABLET BY MOUTH 2 TIMES DAILY., Disp: 180 tablet, Rfl: 1   escitalopram (LEXAPRO) 5 MG tablet, Take 5 mg by mouth daily., Disp: , Rfl:    flecainide (TAMBOCOR) 100 MG tablet, Take 100 mg by mouth 2 (two) times daily., Disp: , Rfl:    furosemide (LASIX) 20 MG tablet, Take 40 mg by mouth every morning. , Disp: , Rfl:    levothyroxine (SYNTHROID, LEVOTHROID) 75 MCG tablet, Take 75 mcg by mouth daily before breakfast. , Disp: , Rfl: 2   losartan (COZAAR) 50 MG tablet, Take 1 tablet (50 mg total) by mouth daily., Disp: 90 tablet, Rfl: 3   metoprolol (LOPRESSOR) 100 MG tablet, Take 100 mg by mouth 2 (two) times daily. , Disp: , Rfl:    Vitamin D, Cholecalciferol, 1000 units TABS, Take 2,000 Units by mouth daily., Disp: , Rfl:    Allergies  Allergen Reactions   Augmentin [Amoxicillin-Pot Clavulanate] Hives and Itching    Has patient had a PCN reaction causing immediate rash,  facial/tongue/throat swelling, SOB or lightheadedness with hypotension:No Has patient had a PCN reaction causing severe rash involving mucus membranes or skin necrosis:No Has patient had a PCN reaction that required hospitalization:No Has patient had a PCN reaction occurring within the last 10 years:No If all of the above answers are "NO", then may proceed with Cephalosporin use.    Pneumococcal Vaccines Other (See Comments)    Caused fever, and swelling at injection site    Past Medical History:  Diagnosis Date   Arthritis    CHF (congestive heart failure) (HCC)    Essential hypertension 10/15/2015   Heart murmur    Hypertension    Hypothyroidism 10/15/2015   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"     Past Surgical History:  Procedure Laterality Date   CARDIOVERSION N/A 02/16/2021   Procedure: CARDIOVERSION;  Surgeon: Duke Salvia, MD;  Location: [facility] ORS;  Service: Cardiovascular;  Laterality: N/A;   CHOLECYSTECTOMY     DILATION AND CURETTAGE OF UTERUS     LOOP RECORDER INSERTION N/A 12/21/2017   Procedure: LOOP RECORDER INSERTION;  Surgeon: Duke Salvia, MD;  Location: [facility] INVASIVE CV LAB;  Service: Cardiovascular;  Laterality: N/A;   OMENTECTOMY N/A 10/12/2015   Procedure: PARTIAL OMENTECTOMY;  Surgeon: Violeta Gelinas, MD;  Location: [facility] OR;  Service: General;  Laterality: N/A;  TOTAL KNEE ARTHROPLASTY Left 04/04/2016   Procedure: LEFT TOTAL KNEE ARTHROPLASTY;  Surgeon: Ollen Gross, MD;  Location: [facility];  Service: Orthopedics;  Laterality: Left;   TOTAL KNEE ARTHROPLASTY Right 08/01/2016   Procedure: TOTAL KNEE ARTHROPLASTY;  Surgeon: Ollen Gross, MD;  Location: [facility];  Service: Orthopedics;  Laterality: Right;   UMBILICAL HERNIA REPAIR N/A 10/12/2015   Procedure: HERNIA REPAIR UMBILICAL ADULT/INCARERATED;  Surgeon: Violeta Gelinas, MD;  Location: [facility] OR;  Service: General;  Laterality: N/A;    Family History  Problem Relation Age of Onset    Rheum arthritis Mother    Heart attack Father    Heart disease Father     Social History   Tobacco Use   Smoking status: Former    Types: Cigarettes    Quit date: 03/29/1987    Years since quitting: 34.1   Smokeless tobacco: Never  Substance Use Topics   Alcohol use: No   Drug use: No    ROS   Objective:   Vitals: BP (!) 162/88 (BP Location: Left Wrist)   Pulse (!) 53   Temp 98.3 F (36.8 C) (Oral)   Resp 20   SpO2 95%   Physical Exam Constitutional:      General: She is not in acute distress.    Appearance: Normal appearance. She is well-developed. She is not ill-appearing, toxic-appearing or diaphoretic.  HENT:     Head: Normocephalic and atraumatic.     Nose: Nose normal.     Mouth/Throat:     Mouth: Mucous membranes are moist.     Pharynx: Oropharynx is clear.  Eyes:     General: No scleral icterus.    Extraocular Movements: Extraocular movements intact.     Pupils: Pupils are equal, round, and reactive to light.  Cardiovascular:     Rate and Rhythm: Normal rate.  Pulmonary:     Effort: Pulmonary effort is normal.  Skin:    General: Skin is warm and dry.     Coloration: Skin is not jaundiced.     Findings: Bruising (resolving hematoma and lacerations over the right forearm without tenderness, induration, drainage of pus or bleeding) present. No erythema, lesion or rash.  Neurological:     General: No focal deficit present.     Mental Status: She is alert and oriented to person, place, and time.  Psychiatric:        Mood and Affect: Mood normal.        Behavior: Behavior normal.       Assessment and Plan :   PDMP not reviewed this encounter.  1. Hematoma     Reassured patient that she is doing excellent wound care.  She is on Eliquis which can lead to easy bruising.  Emphasized that she continue to do wound care exactly as she is doing it.  Counseled on signs of wound infection. Counseled patient on potential for adverse effects with medications  prescribed/recommended today, ER and return-to-clinic precautions discussed, patient verbalized understanding.    Wallis Bamberg, [place] 05/17/21 [id number]

## 2021-05-17 NOTE — ED Triage Notes (Signed)
Thursday cut right forearm on screen door. Has washed it daily and used mupiricen ointment. Redness is spreading across forearm

## 2021-05-21 DIAGNOSIS — I5032 Chronic diastolic (congestive) heart failure: Secondary | ICD-10-CM | POA: Diagnosis not present

## 2021-05-21 DIAGNOSIS — E039 Hypothyroidism, unspecified: Secondary | ICD-10-CM | POA: Diagnosis not present

## 2021-05-21 DIAGNOSIS — I130 Hypertensive heart and chronic kidney disease with heart failure and stage 1 through stage 4 chronic kidney disease, or unspecified chronic kidney disease: Secondary | ICD-10-CM | POA: Diagnosis not present

## 2021-05-21 DIAGNOSIS — N182 Chronic kidney disease, stage 2 (mild): Secondary | ICD-10-CM | POA: Diagnosis not present

## 2021-05-31 DIAGNOSIS — Z0000 Encounter for general adult medical examination without abnormal findings: Secondary | ICD-10-CM | POA: Diagnosis not present

## 2021-05-31 DIAGNOSIS — I10 Essential (primary) hypertension: Secondary | ICD-10-CM | POA: Diagnosis not present

## 2021-05-31 DIAGNOSIS — E559 Vitamin D deficiency, unspecified: Secondary | ICD-10-CM | POA: Diagnosis not present

## 2021-05-31 DIAGNOSIS — E039 Hypothyroidism, unspecified: Secondary | ICD-10-CM | POA: Diagnosis not present

## 2021-05-31 DIAGNOSIS — Z7901 Long term (current) use of anticoagulants: Secondary | ICD-10-CM | POA: Diagnosis not present

## 2021-06-03 DIAGNOSIS — F3340 Major depressive disorder, recurrent, in remission, unspecified: Secondary | ICD-10-CM | POA: Diagnosis not present

## 2021-06-03 DIAGNOSIS — E559 Vitamin D deficiency, unspecified: Secondary | ICD-10-CM | POA: Diagnosis not present

## 2021-06-03 DIAGNOSIS — M8589 Other specified disorders of bone density and structure, multiple sites: Secondary | ICD-10-CM | POA: Diagnosis not present

## 2021-06-03 DIAGNOSIS — N182 Chronic kidney disease, stage 2 (mild): Secondary | ICD-10-CM | POA: Diagnosis not present

## 2021-06-03 DIAGNOSIS — I10 Essential (primary) hypertension: Secondary | ICD-10-CM | POA: Diagnosis not present

## 2021-06-03 DIAGNOSIS — I480 Paroxysmal atrial fibrillation: Secondary | ICD-10-CM | POA: Diagnosis not present

## 2021-06-03 DIAGNOSIS — D735 Infarction of spleen: Secondary | ICD-10-CM | POA: Diagnosis not present

## 2021-06-03 DIAGNOSIS — Z23 Encounter for immunization: Secondary | ICD-10-CM | POA: Diagnosis not present

## 2021-06-03 DIAGNOSIS — E039 Hypothyroidism, unspecified: Secondary | ICD-10-CM | POA: Diagnosis not present

## 2021-06-03 DIAGNOSIS — I509 Heart failure, unspecified: Secondary | ICD-10-CM | POA: Diagnosis not present

## 2021-06-03 DIAGNOSIS — E669 Obesity, unspecified: Secondary | ICD-10-CM | POA: Diagnosis not present

## 2021-06-03 DIAGNOSIS — Z0000 Encounter for general adult medical examination without abnormal findings: Secondary | ICD-10-CM | POA: Diagnosis not present

## 2021-06-03 DIAGNOSIS — M8580 Other specified disorders of bone density and structure, unspecified site: Secondary | ICD-10-CM | POA: Diagnosis not present

## 2021-06-21 DIAGNOSIS — I130 Hypertensive heart and chronic kidney disease with heart failure and stage 1 through stage 4 chronic kidney disease, or unspecified chronic kidney disease: Secondary | ICD-10-CM | POA: Diagnosis not present

## 2021-06-21 DIAGNOSIS — N182 Chronic kidney disease, stage 2 (mild): Secondary | ICD-10-CM | POA: Diagnosis not present

## 2021-06-21 DIAGNOSIS — E039 Hypothyroidism, unspecified: Secondary | ICD-10-CM | POA: Diagnosis not present

## 2021-06-21 DIAGNOSIS — I5032 Chronic diastolic (congestive) heart failure: Secondary | ICD-10-CM | POA: Diagnosis not present

## 2021-06-21 DIAGNOSIS — Z1212 Encounter for screening for malignant neoplasm of rectum: Secondary | ICD-10-CM | POA: Diagnosis not present

## 2021-06-21 DIAGNOSIS — Z1211 Encounter for screening for malignant neoplasm of colon: Secondary | ICD-10-CM | POA: Diagnosis not present

## 2021-06-27 LAB — COLOGUARD: COLOGUARD: NEGATIVE

## 2021-07-09 ENCOUNTER — Other Ambulatory Visit: Payer: Self-pay | Admitting: Internal Medicine

## 2021-07-09 NOTE — Telephone Encounter (Signed)
Eliquis 5 mg refill request received. Patient is 70 years old, weight- 151.5 kg, Crea- 0.93 on 02/02/21, Diagnosis-PAF, and last seen by Dr. Graciela Husbands on 03/11/21. Dose is appropriate based on dosing criteria. Will send in refill to requested pharmacy.

## 2021-07-21 DIAGNOSIS — I5032 Chronic diastolic (congestive) heart failure: Secondary | ICD-10-CM | POA: Diagnosis not present

## 2021-07-21 DIAGNOSIS — I130 Hypertensive heart and chronic kidney disease with heart failure and stage 1 through stage 4 chronic kidney disease, or unspecified chronic kidney disease: Secondary | ICD-10-CM | POA: Diagnosis not present

## 2021-07-21 DIAGNOSIS — N182 Chronic kidney disease, stage 2 (mild): Secondary | ICD-10-CM | POA: Diagnosis not present

## 2021-07-21 DIAGNOSIS — E039 Hypothyroidism, unspecified: Secondary | ICD-10-CM | POA: Diagnosis not present

## 2021-08-31 ENCOUNTER — Other Ambulatory Visit: Payer: Self-pay | Admitting: Internal Medicine

## 2021-09-08 ENCOUNTER — Inpatient Hospital Stay
Admission: EM | Admit: 2021-09-08 | Discharge: 2021-09-10 | DRG: 177 | Disposition: A | Discharge status: Home / Self Care | Payer: PPO | Attending: Student | Admitting: Student

## 2021-09-08 ENCOUNTER — Other Ambulatory Visit: Payer: Self-pay

## 2021-09-08 ENCOUNTER — Emergency Department: Payer: PPO

## 2021-09-08 DIAGNOSIS — J9601 Acute respiratory failure with hypoxia: Secondary | ICD-10-CM | POA: Diagnosis present

## 2021-09-08 DIAGNOSIS — F339 Major depressive disorder, recurrent, unspecified: Secondary | ICD-10-CM | POA: Diagnosis present

## 2021-09-08 DIAGNOSIS — N1831 Chronic kidney disease, stage 3a: Secondary | ICD-10-CM | POA: Diagnosis not present

## 2021-09-08 DIAGNOSIS — Z8249 Family history of ischemic heart disease and other diseases of the circulatory system: Secondary | ICD-10-CM | POA: Diagnosis not present

## 2021-09-08 DIAGNOSIS — Z880 Allergy status to penicillin: Secondary | ICD-10-CM

## 2021-09-08 DIAGNOSIS — E66813 Obesity, class 3: Secondary | ICD-10-CM | POA: Diagnosis present

## 2021-09-08 DIAGNOSIS — Z720 Tobacco use: Secondary | ICD-10-CM | POA: Insufficient documentation

## 2021-09-08 DIAGNOSIS — U071 COVID-19: Secondary | ICD-10-CM | POA: Diagnosis not present

## 2021-09-08 DIAGNOSIS — I248 Other forms of acute ischemic heart disease: Secondary | ICD-10-CM | POA: Diagnosis not present

## 2021-09-08 DIAGNOSIS — I214 Non-ST elevation (NSTEMI) myocardial infarction: Secondary | ICD-10-CM | POA: Diagnosis not present

## 2021-09-08 DIAGNOSIS — I5032 Chronic diastolic (congestive) heart failure: Secondary | ICD-10-CM | POA: Diagnosis not present

## 2021-09-08 DIAGNOSIS — Z950 Presence of cardiac pacemaker: Secondary | ICD-10-CM

## 2021-09-08 DIAGNOSIS — I872 Venous insufficiency (chronic) (peripheral): Secondary | ICD-10-CM | POA: Diagnosis not present

## 2021-09-08 DIAGNOSIS — I480 Paroxysmal atrial fibrillation: Secondary | ICD-10-CM | POA: Diagnosis not present

## 2021-09-08 DIAGNOSIS — Z96653 Presence of artificial knee joint, bilateral: Secondary | ICD-10-CM | POA: Diagnosis present

## 2021-09-08 DIAGNOSIS — Z87891 Personal history of nicotine dependence: Secondary | ICD-10-CM | POA: Diagnosis not present

## 2021-09-08 DIAGNOSIS — I130 Hypertensive heart and chronic kidney disease with heart failure and stage 1 through stage 4 chronic kidney disease, or unspecified chronic kidney disease: Secondary | ICD-10-CM | POA: Diagnosis not present

## 2021-09-08 DIAGNOSIS — Z20822 Contact with and (suspected) exposure to covid-19: Secondary | ICD-10-CM | POA: Diagnosis present

## 2021-09-08 DIAGNOSIS — I10 Essential (primary) hypertension: Secondary | ICD-10-CM | POA: Diagnosis present

## 2021-09-08 DIAGNOSIS — R7989 Other specified abnormal findings of blood chemistry: Secondary | ICD-10-CM | POA: Diagnosis present

## 2021-09-08 DIAGNOSIS — Z6841 Body Mass Index (BMI) 40.0 and over, adult: Secondary | ICD-10-CM | POA: Diagnosis not present

## 2021-09-08 DIAGNOSIS — I129 Hypertensive chronic kidney disease with stage 1 through stage 4 chronic kidney disease, or unspecified chronic kidney disease: Secondary | ICD-10-CM

## 2021-09-08 DIAGNOSIS — R457 State of emotional shock and stress, unspecified: Secondary | ICD-10-CM | POA: Diagnosis not present

## 2021-09-08 DIAGNOSIS — Z79899 Other long term (current) drug therapy: Secondary | ICD-10-CM | POA: Diagnosis not present

## 2021-09-08 DIAGNOSIS — I517 Cardiomegaly: Secondary | ICD-10-CM | POA: Diagnosis not present

## 2021-09-08 DIAGNOSIS — Z79890 Hormone replacement therapy (postmenopausal): Secondary | ICD-10-CM | POA: Diagnosis not present

## 2021-09-08 DIAGNOSIS — K219 Gastro-esophageal reflux disease without esophagitis: Secondary | ICD-10-CM | POA: Diagnosis not present

## 2021-09-08 DIAGNOSIS — E039 Hypothyroidism, unspecified: Secondary | ICD-10-CM | POA: Diagnosis not present

## 2021-09-08 DIAGNOSIS — J069 Acute upper respiratory infection, unspecified: Secondary | ICD-10-CM | POA: Diagnosis present

## 2021-09-08 DIAGNOSIS — R059 Cough, unspecified: Secondary | ICD-10-CM | POA: Diagnosis not present

## 2021-09-08 DIAGNOSIS — Z8261 Family history of arthritis: Secondary | ICD-10-CM | POA: Diagnosis not present

## 2021-09-08 DIAGNOSIS — Z7901 Long term (current) use of anticoagulants: Secondary | ICD-10-CM

## 2021-09-08 DIAGNOSIS — F419 Anxiety disorder, unspecified: Secondary | ICD-10-CM | POA: Diagnosis not present

## 2021-09-08 DIAGNOSIS — E559 Vitamin D deficiency, unspecified: Secondary | ICD-10-CM | POA: Insufficient documentation

## 2021-09-08 DIAGNOSIS — R778 Other specified abnormalities of plasma proteins: Secondary | ICD-10-CM | POA: Diagnosis present

## 2021-09-08 DIAGNOSIS — F32A Depression, unspecified: Secondary | ICD-10-CM | POA: Diagnosis not present

## 2021-09-08 DIAGNOSIS — D735 Infarction of spleen: Secondary | ICD-10-CM

## 2021-09-08 DIAGNOSIS — R062 Wheezing: Secondary | ICD-10-CM | POA: Diagnosis not present

## 2021-09-08 HISTORY — DX: Tobacco use: Z72.0

## 2021-09-08 HISTORY — DX: Hypertensive chronic kidney disease with stage 1 through stage 4 chronic kidney disease, or unspecified chronic kidney disease: I12.9

## 2021-09-08 HISTORY — DX: Gastro-esophageal reflux disease without esophagitis: K21.9

## 2021-09-08 HISTORY — DX: Acute upper respiratory infection, unspecified: J06.9

## 2021-09-08 HISTORY — DX: Morbid (severe) obesity due to excess calories: E66.01

## 2021-09-08 HISTORY — DX: Infarction of spleen: D73.5

## 2021-09-08 HISTORY — DX: COVID-19: U07.1

## 2021-09-08 HISTORY — DX: Anxiety disorder, unspecified: F41.9

## 2021-09-08 HISTORY — DX: Chronic diastolic (congestive) heart failure: I50.32

## 2021-09-08 HISTORY — DX: Obesity, class 3: E66.813

## 2021-09-08 HISTORY — DX: Vitamin D deficiency, unspecified: E55.9

## 2021-09-08 HISTORY — DX: Paroxysmal atrial fibrillation: I48.0

## 2021-09-08 LAB — CBC WITH DIFFERENTIAL/PLATELET
Abs Immature Granulocytes: 0.03 10*3/uL (ref 0.00–0.07)
Basophils Absolute: 0 10*3/uL (ref 0.0–0.1)
Basophils Relative: 0 %
Eosinophils Absolute: 0.1 10*3/uL (ref 0.0–0.5)
Eosinophils Relative: 1 %
HCT: 41.4 % (ref 36.0–46.0)
Hemoglobin: 13.2 g/dL (ref 12.0–15.0)
Immature Granulocytes: 0 %
Lymphocytes Relative: 14 %
Lymphs Abs: 1 10*3/uL (ref 0.7–4.0)
MCH: 29 pg (ref 26.0–34.0)
MCHC: 31.9 g/dL (ref 30.0–36.0)
MCV: 91 fL (ref 80.0–100.0)
Monocytes Absolute: 0.7 10*3/uL (ref 0.1–1.0)
Monocytes Relative: 10 %
Neutro Abs: 5.5 10*3/uL (ref 1.7–7.7)
Neutrophils Relative %: 75 %
Platelets: 197 10*3/uL (ref 150–400)
RBC: 4.55 MIL/uL (ref 3.87–5.11)
RDW: 14.6 % (ref 11.5–15.5)
WBC: 7.4 10*3/uL (ref 4.0–10.5)
nRBC: 0 % (ref 0.0–0.2)

## 2021-09-08 LAB — AST: AST: 44 U/L — ABNORMAL HIGH (ref 15–41)

## 2021-09-08 LAB — BASIC METABOLIC PANEL
Anion gap: 7 (ref 5–15)
BUN: 23 mg/dL (ref 8–23)
CO2: 27 mmol/L (ref 22–32)
Calcium: 8.8 mg/dL — ABNORMAL LOW (ref 8.9–10.3)
Chloride: 100 mmol/L (ref 98–111)
Creatinine, Ser: 1.04 mg/dL — ABNORMAL HIGH (ref 0.44–1.00)
GFR, Estimated: 57 mL/min — ABNORMAL LOW (ref >60)
Glucose, Bld: 102 mg/dL — ABNORMAL HIGH (ref 70–99)
Potassium: 4.4 mmol/L (ref 3.5–5.1)
Sodium: 134 mmol/L — ABNORMAL LOW (ref 135–145)

## 2021-09-08 LAB — C-REACTIVE PROTEIN: CRP: 4.7 mg/dL — ABNORMAL HIGH (ref <1.0)

## 2021-09-08 LAB — URINALYSIS, COMPLETE (UACMP) WITH MICROSCOPIC
Bacteria, UA: NONE SEEN
Bilirubin Urine: NEGATIVE
Glucose, UA: NEGATIVE mg/dL
Hgb urine dipstick: NEGATIVE
Ketones, ur: NEGATIVE mg/dL
Leukocytes,Ua: NEGATIVE
Nitrite: NEGATIVE
Protein, ur: NEGATIVE mg/dL
Specific Gravity, Urine: 1.004 — ABNORMAL LOW (ref 1.005–1.030)
pH: 5 (ref 5.0–8.0)

## 2021-09-08 LAB — PROCALCITONIN: Procalcitonin: <0.1 ng/mL

## 2021-09-08 LAB — TROPONIN I (HIGH SENSITIVITY)
Troponin I (High Sensitivity): 147 ng/L (ref <18)
Troponin I (High Sensitivity): 223 ng/L (ref <18)

## 2021-09-08 LAB — FIBRINOGEN: Fibrinogen: 770 mg/dL — ABNORMAL HIGH (ref 210–475)

## 2021-09-08 LAB — RESP PANEL BY RT-PCR (FLU A&B, COVID) ARPGX2
Influenza A by PCR: NEGATIVE
Influenza B by PCR: NEGATIVE
SARS Coronavirus 2 by RT PCR: POSITIVE — AB

## 2021-09-08 LAB — FERRITIN: Ferritin: 303 ng/mL (ref 11–307)

## 2021-09-08 LAB — BRAIN NATRIURETIC PEPTIDE: B Natriuretic Peptide: 184 pg/mL — ABNORMAL HIGH (ref 0.0–100.0)

## 2021-09-08 LAB — ALT: ALT: 29 U/L (ref 0–44)

## 2021-09-08 LAB — D-DIMER, QUANTITATIVE: D-Dimer, Quant: 0.67 ug/mL-FEU — ABNORMAL HIGH (ref 0.00–0.50)

## 2021-09-08 MED ORDER — APIXABAN 5 MG PO TABS
5.0000 mg | ORAL_TABLET | Freq: Two times a day (BID) | ORAL | Status: DC
  Administered 2021-09-08 – 2021-09-10 (×4): 5 mg via ORAL
  Filled 2021-09-08 (×4): qty 1

## 2021-09-08 MED ORDER — ACETAMINOPHEN 650 MG RE SUPP: 650.0000 mg | Freq: Four times a day (QID) | RECTAL | Status: DC | PRN

## 2021-09-08 MED ORDER — LOSARTAN POTASSIUM 50 MG PO TABS
50.0000 mg | ORAL_TABLET | Freq: Every day | ORAL | Status: DC
  Administered 2021-09-09: 50 mg via ORAL
  Filled 2021-09-08: qty 1

## 2021-09-08 MED ORDER — FLECAINIDE ACETATE 100 MG PO TABS
100.0000 mg | ORAL_TABLET | Freq: Two times a day (BID) | ORAL | Status: DC
  Administered 2021-09-08 – 2021-09-10 (×4): 100 mg via ORAL
  Filled 2021-09-08 (×6): qty 1

## 2021-09-08 MED ORDER — FUROSEMIDE 40 MG PO TABS
40.0000 mg | ORAL_TABLET | Freq: Every morning | ORAL | Status: DC
  Administered 2021-09-09 – 2021-09-10 (×2): 40 mg via ORAL
  Filled 2021-09-08 (×2): qty 1

## 2021-09-08 MED ORDER — ONDANSETRON HCL 4 MG/2ML IJ SOLN: 4.0000 mg | Freq: Four times a day (QID) | INTRAMUSCULAR | Status: DC | PRN

## 2021-09-08 MED ORDER — ASPIRIN 81 MG PO CHEW
324.0000 mg | CHEWABLE_TABLET | Freq: Once | ORAL | Status: AC
  Administered 2021-09-08: 324 mg via ORAL
  Filled 2021-09-08: qty 4

## 2021-09-08 MED ORDER — VITAMIN D 25 MCG (1000 UNIT) PO TABS
2000.0000 [IU] | ORAL_TABLET | Freq: Every day | ORAL | Status: DC
  Administered 2021-09-09 – 2021-09-10 (×2): 2000 [IU] via ORAL
  Filled 2021-09-08 (×2): qty 2

## 2021-09-08 MED ORDER — ONDANSETRON HCL 4 MG PO TABS: 4.0000 mg | ORAL_TABLET | Freq: Four times a day (QID) | ORAL | Status: DC | PRN

## 2021-09-08 MED ORDER — LEVOTHYROXINE SODIUM 75 MCG PO TABS
75.0000 ug | ORAL_TABLET | Freq: Every day | ORAL | Status: DC
  Administered 2021-09-09 – 2021-09-10 (×2): 75 ug via ORAL
  Filled 2021-09-08 (×2): qty 1

## 2021-09-08 MED ORDER — SODIUM CHLORIDE 0.9 % IV SOLN
100.0000 mg | Freq: Every day | INTRAVENOUS | Status: DC
  Administered 2021-09-09 – 2021-09-10 (×2): 100 mg via INTRAVENOUS
  Filled 2021-09-08: qty 20
  Filled 2021-09-08: qty 100

## 2021-09-08 MED ORDER — GUAIFENESIN-DM 100-10 MG/5ML PO SYRP: 15.0000 mL | ORAL_SOLUTION | ORAL | Status: DC | PRN

## 2021-09-08 MED ORDER — ASCORBIC ACID 500 MG PO TABS
500.0000 mg | ORAL_TABLET | Freq: Every day | ORAL | Status: DC
  Administered 2021-09-08 – 2021-09-10 (×3): 500 mg via ORAL
  Filled 2021-09-08 (×3): qty 1

## 2021-09-08 MED ORDER — ESCITALOPRAM OXALATE 10 MG PO TABS
5.0000 mg | ORAL_TABLET | Freq: Every day | ORAL | Status: DC
  Administered 2021-09-09 – 2021-09-10 (×2): 5 mg via ORAL
  Filled 2021-09-08 (×2): qty 0.5

## 2021-09-08 MED ORDER — METOPROLOL TARTRATE 50 MG PO TABS
100.0000 mg | ORAL_TABLET | Freq: Two times a day (BID) | ORAL | Status: DC
  Administered 2021-09-08 – 2021-09-10 (×4): 100 mg via ORAL
  Filled 2021-09-08 (×4): qty 2

## 2021-09-08 MED ORDER — SODIUM CHLORIDE 0.9 % IV SOLN
200.0000 mg | Freq: Once | INTRAVENOUS | Status: AC
  Administered 2021-09-08: 200 mg via INTRAVENOUS
  Filled 2021-09-08: qty 200

## 2021-09-08 MED ORDER — DEXAMETHASONE SODIUM PHOSPHATE 10 MG/ML IJ SOLN
6.0000 mg | INTRAMUSCULAR | Status: DC
  Administered 2021-09-08 – 2021-09-09 (×2): 6 mg via INTRAVENOUS
  Filled 2021-09-08 (×2): qty 1

## 2021-09-08 MED ORDER — ZINC SULFATE 220 (50 ZN) MG PO CAPS
220.0000 mg | ORAL_CAPSULE | Freq: Every day | ORAL | Status: DC
  Administered 2021-09-08 – 2021-09-10 (×3): 220 mg via ORAL
  Filled 2021-09-08 (×3): qty 1

## 2021-09-08 MED ORDER — ACETAMINOPHEN 325 MG PO TABS: 650.0000 mg | ORAL_TABLET | Freq: Four times a day (QID) | ORAL | Status: DC | PRN

## 2021-09-08 MED ORDER — ALBUTEROL SULFATE (2.5 MG/3ML) 0.083% IN NEBU: 2.5000 mg | INHALATION_SOLUTION | RESPIRATORY_TRACT | Status: DC | PRN

## 2021-09-08 NOTE — ED Notes (Signed)
Pt in xray

## 2021-09-08 NOTE — ED Provider Notes (Signed)
[facility] Provider Note    Event Date/Time   First MD Initiated Contact with Patient 09/08/21 1252     (approximate)   History   Generalized Body Aches   HPI  [name] is a 71 y.o. female with a past medical history of A. fib on Eliquis and flecainide, CHF, HTN, hypothyroidism and remote tobacco abuse who presents for assessment of proximately 4 days of worsening cough and shortness of breath associate with body aches.  Patient states she thinks he got some mucus stuck in her trachea and was having a particularly tough time breathing earlier today.  He denies any vomiting, diarrhea, Donnell pain, chest pain, burning with urination, earache, headache but does endorse little bit of sore throat.  No clear alleviating factors.  No other acute concerns at this time.      Physical Exam  Triage Vital Signs: ED Triage Vitals  Enc Vitals Group     BP 09/08/21 1139 (!) 184/87     Pulse Rate 09/08/21 1139 (!) 58     Resp 09/08/21 1139 19     Temp 09/08/21 1139 99.6 F (37.6 C)     Temp Source 09/08/21 1139 Oral     SpO2 09/08/21 1139 90 %     Weight --      Height --      Head Circumference --      Peak Flow --      Pain Score 09/08/21 1137 0     Pain Loc --      Pain Edu? --      Excl. in [place]? --     Most recent vital signs: Vitals:   09/08/21 1422 09/08/21 1423  BP: (!) 164/60 (!) 164/60  Pulse: (!) 55 (!) 56  Resp: 17 18  Temp: 98.2 F (36.8 C)   SpO2: 92% 93%    General: Awake, appears moderately uncomfortable. CV:  Good peripheral perfusion.  Slight systolic murmur.  No other murmurs rubs or gallops.  2+ radial pulses. Resp:  Normal effort.  Clear bilaterally. Abd:  No distention.  Soft throughout. Other:  Some mild lower extremity   ED Results / Procedures / Treatments  Labs (all labs ordered are listed, but only abnormal results are displayed) Labs Reviewed  RESP PANEL BY RT-PCR (FLU A&B, COVID) ARPGX2 - Abnormal; Notable  for the following components:      Result Value   SARS Coronavirus 2 by RT PCR POSITIVE (*)    All other components within normal limits  URINALYSIS, COMPLETE (UACMP) WITH MICROSCOPIC - Abnormal; Notable for the following components:   Color, Urine COLORLESS (*)    APPearance CLEAR (*)    Specific Gravity, Urine 1.004 (*)    All other components within normal limits  BASIC METABOLIC PANEL - Abnormal; Notable for the following components:   Sodium 134 (*)    Glucose, Bld 102 (*)    Creatinine, Ser 1.04 (*)    Calcium 8.8 (*)    GFR, Estimated 57 (*)    All other components within normal limits  BRAIN NATRIURETIC PEPTIDE - Abnormal; Notable for the following components:   B Natriuretic Peptide 184.0 (*)    All other components within normal limits  TROPONIN I (HIGH SENSITIVITY) - Abnormal; Notable for the following components:   Troponin I (High Sensitivity) 147 (*)    All other components within normal limits  CBC WITH DIFFERENTIAL/PLATELET  PROCALCITONIN  FIBRINOGEN  FERRITIN  D-DIMER, QUANTITATIVE  C-REACTIVE PROTEIN  ALT  AST  TROPONIN I (HIGH SENSITIVITY)     EKG  EKG remarkable for sinus bradycardia with ventricular rate of 57, first-degree AV block with a PR interval of 244, left axis deviation, nonspecific ST change in lead III without other clear evidence of acute ischemia or significant arrhythmia.   RADIOLOGY  Chest x-ray reviewed by myself shows stable cardiomegaly without overt edema, pneumothorax, effusion or clear focal consolidation.  Also reviewed radiology interpretation and agree with their findings.   PROCEDURES:  Critical Care performed: No  Procedures   MEDICATIONS ORDERED IN ED: Medications  remdesivir 200 mg in sodium chloride 0.9% 250 mL IVPB (has no administration in time range)    Followed by  remdesivir 100 mg in sodium chloride 0.9 % 100 mL IVPB (has no administration in time range)  dexamethasone (DECADRON) injection 6 mg (has no  administration in time range)  guaiFENesin-dextromethorphan (ROBITUSSIN DM) 100-10 MG/5ML syrup 15 mL (has no administration in time range)  ascorbic acid (VITAMIN C) tablet 500 mg (has no administration in time range)  zinc sulfate capsule 220 mg (has no administration in time range)  aspirin chewable tablet 324 mg (324 mg Oral Given 09/08/21 1448)     IMPRESSION / MDM / ASSESSMENT AND PLAN / ED COURSE  I reviewed the triage vital signs and the nursing notes.                              Differential diagnosis includes, but is not limited to CHF exacerbation, pneumonia, bronchitis, arrhythmia, ACS, metabolic derangements, dehydration and anemia.  PCR is positive for COVID.  EKG remarkable for sinus bradycardia with ventricular rate of 57, first-degree AV block with a PR interval of 244, left axis deviation, nonspecific ST change in lead III without other clear evidence of acute ischemia or significant arrhythmia.  Chest x-ray reviewed by myself shows stable cardiomegaly without overt edema, pneumothorax, effusion or clear focal consolidation.  Also reviewed radiology interpretation and agree with their findings.  CBC shows no leukocytosis or acute anemia.  BMP without any significant electrolyte or metabolic derangements.  BNP slightly elevated at 184 although overall, lower suspicion for acute pulm edema causing patient's shortness of breath and cough and I suspect her acute COVID infection is the primary etiology.  Procalcitonin is undetectable and given absence of leukocytosis or focal consolidation on chest x-ray I have a very low suspicion for bacterial pneumonia at this time.  Will patient briefly had an SPO2 measured at 89% with ambulation she is hovering around 92 to 93% in bed.  Her initial troponin is somewhat depressing elevated at 147.  While she is not significantly hypoxic given her age and comorbidities with evidence of an NSTEMI I think admission is warranted and I discussed this  with the hospitalist who agrees and will place admission orders.  She is already on Eliquis and took her morning dose will defer heparin but give a dose of ASA here.  Order placed for remdesivir as well.    FINAL CLINICAL IMPRESSION(S) / ED DIAGNOSES   Final diagnoses:  COVID  NSTEMI (non-ST elevated myocardial infarction) ([place])     Rx / DC Orders   ED Discharge Orders     None        Note:  This document was prepared using Dragon voice recognition software and may include unintentional dictation errors.   Lucrezia Starch, MD 09/08/21 1505

## 2021-09-08 NOTE — Plan of Care (Signed)
Patient profile completed. No complaints of pain. No respiratory distress noted. Son is at the bedside. Chaplain notified about patient's request to update advance directive.

## 2021-09-08 NOTE — H&P (Signed)
History and Physical    [name] [id number] DOB: August 07, 1951 DOA: 09/08/2021  PCP: Deland Pretty, MD   Patient coming from: Home.  I have personally briefly reviewed patient's old medical records in [place]  Chief Complaint: Cough, dyspnea associated with body aches.  HPI: [name] is a 71 y.o. female with medical history significant of anxiety disorder, osteoarthritis, chronic diastolic CHF, chronic kidney disease due to hypertension, hypertension, GERD, hypothyroidism, infarction of the spleen, osteoarthritis both knees, paroxysmal atrial fibrillation, history of t remote obacco use, vitamin D deficiency who is coming to the emergency department due to nonproductive cough, progressively worse dyspnea associated with generalized body aches since Sunday.  The patient stated that this morning she got very short of breath after she tried to cough and apparently some mucus fell down while her airways causing significant dyspnea and persistent cough for a while.  No fever or night sweats, positive chills, fatigue, malaise, sore throat, rhinorrhea but denied wheezing hemoptysis, chest pain, palpitations, diaphoresis, PND, orthopnea.  She occasionally gets lower extremity edema.  No abdominal pain, nausea, vomiting, diarrhea, constipation, melena or hematochezia.  No flank pain, dysuria, frequency or hematuria.  No polyuria, polydipsia, polyphagia or blurred vision.  ED Course: Initial vital signs were temperature 99.6 F, pulse 58, respiratory rate 19, BP 184/87 mmHg and O2 sats 90% on room air.  The patient was given chewable aspirin 324 mg p.o. x1.  Lab work: Urinalysis was colorless and clear.  CBC was normal with a white count 7.4, hemoglobin 13.3 g/dL platelets 197.  Procalcitonin was less than 0.1 ng/mL.  Troponin was 247 ng/L.  CMP showed sodium of 134 ng/L, normal potassium, chloride, CO2 and BUN.  Glucose 102, creatinine 1.04 and calcium 8.8 mg/dL.  Imaging: A 2  view chest radiograph showed cardiomegaly with no acute process identified.  Please see images and full radiology report for further details.  Review of Systems: As per HPI otherwise all other systems reviewed and are negative.  Past Medical History:  Diagnosis Date   Anxiety disorder 09/08/2021   Arthritis    CHF (congestive heart failure) (HCC)    Chronic diastolic heart failure ([place]) 09/08/2021   Chronic kidney disease due to hypertension 09/08/2021   Essential hypertension 10/15/2015   Gastroesophageal reflux disease 09/08/2021   Heart murmur    Hypertension    Hypothyroidism 10/15/2015   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   OA (osteoarthritis) of knee 04/04/2016   Paroxysmal atrial fibrillation ([place]) 09/08/2021   Tobacco user 09/08/2021   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"   Vitamin D deficiency 09/08/2021   Past Surgical History:  Procedure Laterality Date   CARDIOVERSION N/A 02/16/2021   Procedure: CARDIOVERSION;  Surgeon: Deboraha Sprang, MD;  Location: [facility];  Service: Cardiovascular;  Laterality: N/A;   CHOLECYSTECTOMY     DILATION AND CURETTAGE OF UTERUS     LOOP RECORDER INSERTION N/A 12/21/2017   Procedure: LOOP RECORDER INSERTION;  Surgeon: Deboraha Sprang, MD;  Location: [facility];  Service: Cardiovascular;  Laterality: N/A;   OMENTECTOMY N/A 10/12/2015   Procedure: PARTIAL OMENTECTOMY;  Surgeon: Georganna Skeans, MD;  Location: [facility];  Service: General;  Laterality: N/A;   TOTAL KNEE ARTHROPLASTY Left 04/04/2016   Procedure: LEFT TOTAL KNEE ARTHROPLASTY;  Surgeon: Gaynelle Arabian, MD;  Location: [facility];  Service: Orthopedics;  Laterality: Left;   TOTAL KNEE ARTHROPLASTY Right 08/01/2016   Procedure: TOTAL KNEE ARTHROPLASTY;  Surgeon: Gaynelle Arabian, MD;  Location: [facility];  Service: Orthopedics;  Laterality: Right;   UMBILICAL HERNIA REPAIR N/A 10/12/2015   Procedure: HERNIA REPAIR UMBILICAL ADULT/INCARERATED;  Surgeon: Georganna Skeans, MD;   Location: [facility];  Service: General;  Laterality: N/A;   Social History  reports that she quit smoking about 34 years ago. Her smoking use included cigarettes. She has never used smokeless tobacco. She reports that she does not drink alcohol and does not use drugs.  Allergies  Allergen Reactions   Augmentin [Amoxicillin-Pot Clavulanate] Hives and Itching    Has patient had a PCN reaction causing immediate rash, facial/tongue/throat swelling, SOB or lightheadedness with hypotension:No Has patient had a PCN reaction causing severe rash involving mucus membranes or skin necrosis:No Has patient had a PCN reaction that required hospitalization:No Has patient had a PCN reaction occurring within the last 10 years:No If all of the above answers are "NO", then may proceed with Cephalosporin use.    Pneumococcal Vaccines Other (See Comments)    Caused fever, and swelling at injection site   Family History  Problem Relation Age of Onset   Rheum arthritis Mother    Heart attack Father    Heart disease Father    Prior to Admission medications   Medication Sig Start Date End Date Taking? Authorizing Provider  acetaminophen (TYLENOL) 500 MG tablet Take 1,000 mg by mouth at bedtime.   Yes [provider]  ascorbic acid (VITAMIN C) 500 MG tablet Take 500 mg by mouth daily.   Yes [provider]  ELIQUIS 5 MG TABS tablet TAKE 1 TABLET BY MOUTH 2 TIMES DAILY. 07/09/21  Yes Deboraha Sprang, MD  escitalopram (LEXAPRO) 5 MG tablet Take 5 mg by mouth daily. 12/28/20  Yes [provider]  flecainide (TAMBOCOR) 100 MG tablet TAKE 1 TABLET BY MOUTH 2 TIMES A DAY 08/31/21  Yes Deboraha Sprang, MD  furosemide (LASIX) 20 MG tablet Take 40 mg by mouth every morning.    Yes [provider]  levothyroxine (SYNTHROID, LEVOTHROID) 75 MCG tablet Take 75 mcg by mouth daily before breakfast.  09/07/15  Yes [provider]  losartan (COZAAR) 50 MG tablet Take 1 tablet (50 mg total)  by mouth daily. 02/02/21 09/08/21 Yes Deboraha Sprang, MD  metoprolol (LOPRESSOR) 100 MG tablet Take 100 mg by mouth 2 (two) times daily.  10/05/15  Yes [provider]  Vitamin D, Cholecalciferol, 1000 units TABS Take 2,000 Units by mouth daily.   Yes [provider]  diltiazem (CARDIZEM) 30 MG tablet Take 1 tablet every 4 hours AS NEEDED for heart rate >100 as long as blood pressure >100. Patient not taking: Reported on 09/08/2021 07/28/20   Deboraha Sprang, MD   Physical Exam: Vitals:   09/08/21 1139 09/08/21 1422 09/08/21 1423  BP: (!) 184/87 (!) 164/60 (!) 164/60  Pulse: (!) 58 (!) 55 (!) 56  Resp: 19 17 18   Temp: 99.6 F (37.6 C) 98.2 F (36.8 C)   TempSrc: Oral Oral   SpO2: 90% 92% 93%   Constitutional: Acutely ill in appearance.  NAD, calm, comfortable Eyes: PERRL, lids and conjunctivae normal.  Bilateral conjunctival injection. ENMT: Mucous membranes are moist. Posterior pharynx clear of any exudate or lesions. Neck: normal, supple, no masses, no thyromegaly Respiratory: Decreased breath sounds in bases with scattered crackles on middle and lower lung fields. Normal respiratory effort. No accessory muscle use.  Cardiovascular: Regular rate and rhythm, no murmurs / rubs / gallops.  Stage II lymphedema.  No extremity pitting edema. 2+ pedal pulses. No carotid bruits.  Abdomen: Obese, no distention.  Soft, no tenderness, no masses palpated. No hepatosplenomegaly. Bowel sounds positive.  Musculoskeletal: no clubbing / cyanosis.  Mild to moderate generalized weakness. Good ROM, no contractures. Normal muscle tone.  Skin: Bilateral lower pretibial venous stasis dermatitis without ulceration.  Stage II lymphedema. Neurologic: CN 2-12 grossly intact. Sensation intact, DTR normal. Strength 5/5 in all 4.  Psychiatric: Normal judgment and insight. Alert and oriented x 3. Normal mood.   Labs on Admission: I have personally reviewed following labs and imaging  studies  CBC: Recent Labs  Lab 09/08/21 1314  WBC 7.4  NEUTROABS 5.5  HGB 13.2  HCT 41.4  MCV 91.0  PLT [id number]    Basic Metabolic Panel: Recent Labs  Lab 09/08/21 1314  NA 134*  K 4.4  CL 100  CO2 27  GLUCOSE 102*  BUN 23  CREATININE 1.04*  CALCIUM 8.8*    GFR: CrCl cannot be calculated (Unknown ideal weight.).  Liver Function Tests: No results for input(s): AST, ALT, ALKPHOS, BILITOT, PROT, ALBUMIN in the last 168 hours.  Urine analysis:    Component Value Date/Time   COLORURINE COLORLESS (A) 09/08/2021 1314   APPEARANCEUR CLEAR (A) 09/08/2021 1314   LABSPEC 1.004 (L) 09/08/2021 1314   PHURINE 5.0 09/08/2021 1314   GLUCOSEU NEGATIVE 09/08/2021 1314   HGBUR NEGATIVE 09/08/2021 1314   BILIRUBINUR NEGATIVE 09/08/2021 1314   KETONESUR NEGATIVE 09/08/2021 1314   PROTEINUR NEGATIVE 09/08/2021 1314   NITRITE NEGATIVE 09/08/2021 1314   LEUKOCYTESUR NEGATIVE 09/08/2021 1314    Radiological Exams on Admission: DG Chest 2 View  Result Date: 09/08/2021 CLINICAL DATA:  COVID positive EXAM: CHEST - 2 VIEW COMPARISON:  None. FINDINGS: Heart is enlarged. Mediastinum appears within normal limits. Left-sided atrial loop recorder. Pulmonary vasculature is normal. No focal consolidation identified. No pleural effusion or pneumothorax. IMPRESSION: Cardiomegaly with no acute process identified. Electronically Signed   By: Ofilia Neas M.D.   On: 09/08/2021 13:28    12/08/2017 echocardiogram -------------------------------------------------------------------  LV EF: 65% -   70%   -------------------------------------------------------------------  Indications:      Splenic infarct.   -------------------------------------------------------------------  History:   PMH:   Murmur.  Congestive heart failure.  Risk factors:   Hypertension.   -------------------------------------------------------------------  Study Conclusions   - Left ventricle: The cavity size was normal.  Wall thickness was    normal. Systolic function was vigorous. The estimated ejection    fraction was in the range of 65% to 70%. Wall motion was normal;    there were no regional wall motion abnormalities. Left    ventricular diastolic function parameters were normal.  - Pulmonary arteries: Systolic pressure was mildly increased. PA    peak pressure: 45 mm Hg (S).   EKG: Independently reviewed.  Vent. rate 57 BPM PR interval 244 ms QRS duration 126 ms QT/QTcB 458/445 ms P-R-T axes 69 -52 19 Sinus bradycardia with 1st degree A-V block with Premature atrial complexes Left axis deviation Non-specific intra-ventricular conduction block Minimal voltage criteria for LVH, may be normal variant ( Cornell product ) Abnormal ECG  Assessment/Plan Principal Problem:   Acute respiratory failure with hypoxia (HCC) In the setting of   Acute respiratory disease due to COVID-19 virus Admit to PCU with/inpatient. Continue supplemental oxygen. Scheduled and as needed bronchodilators. Incentive spirometry and flutter valve. Remdesivir per pharmacy. Dexamethasone 6 mg IVP every 24 hours. Continue antibacterials for now. Follow-up  procalcitonin in the morning. Follow-up CBC, CMP and inflammatory markers.  Active Problems:   Elevated troponin Secondary to demand ischemia. Follow-up troponin level.    Chronic diastolic heart failure [facility]) Recently diagnosed at [facility]. Last echo was only here normal almost 4 years ago. No signs of decompensation at this time. Continue furosemide, losartan and metoprolol. Follow-up with PCP.    Paroxysmal atrial fibrillation (HCC) Continue apixaban 5 mg p.o. twice daily. Continue metoprolol 100 mg p.o. twice daily. Continue Tambocor 100 mg p.o. twice daily.    Essential hypertension Continue furosemide 40 mg p.o. daily. Continue losartan 50 mg p.o. daily. Continue metoprolol 100 mg p.o. daily. Monitor BP, HR, renal function and  electrolytes.    Hypothyroidism Continue levothyroxine 50 mg p.o. daily. Follow-up TSH as an outpatient with PCP.    Recurrent major depression (HCC) Continue Lexapro 5 mg p.o. daily.    Gastroesophageal reflux disease Protonix 40 mg p.o. daily.    Class 3 obesity (HCC) Lifestyle modifications. Follow-up with primary care provider.     DVT prophylaxis: On Eliquis. Code Status:   Full code. Family Communication:   Disposition Plan:   Patient is from:  Home.  Anticipated DC to:  Home.  Anticipated DC date:  09/10/2021 or 09/11/2021.  Anticipated DC barriers: Clinical condition.  Consults called:   Admission status:  Inpatient/PCU.  Severity of Illness: High severity in the setting of acute respiratory failure due to hypoxia.  Reubin Milan MD [facility]  How to contact the [facility] Attending or Consulting provider [place] or covering provider during after hours [place], for this patient?   Check the care team in [facility] and look for a) attending/consulting [facility] provider listed and b) the [facility] team listed Log into [email or web address] and use [place]'s universal password to access. If you do not have the password, please contact the hospital operator. Locate the [facility] provider you are looking for under [facility] and page to a number that you can be directly reached. If you still have difficulty reaching the provider, please page the [facility] (Director on Call) for the Hospitalists listed on amion for assistance.  09/08/2021, 3:57 PM   This document was prepared using Dragon voice recognition software and may contain some unintended transcription errors.

## 2021-09-08 NOTE — Progress Notes (Signed)
PT was on [facility] list, requested ACD

## 2021-09-08 NOTE — ED Notes (Addendum)
IV placed, sluggish blood return. Will straight stick for labs.

## 2021-09-08 NOTE — ED Notes (Addendum)
Ambulatory sats performed by this tech. Pt walked in place. Sp02 was 95% before task was performed. Sp02 stayed mainly within range of 92% to 93% not dropping lower than 91%. Pt was sat back on bed. Spo2 was at 89% after sitting back down.

## 2021-09-08 NOTE — ED Triage Notes (Signed)
Pt comes with c/o generalized body aches since Sunday. VSS per EMs

## 2021-09-08 NOTE — ED Notes (Signed)
Pt and son voicing concern about S/E of remdisivir. Asking if there are other antivirals that pt could take. Asking to talk with doctor about this. Informed EDP.

## 2021-09-08 NOTE — ED Notes (Signed)
Lab called, critical troponin of 147. Communicated to provider.

## 2021-09-08 NOTE — Consult Note (Addendum)
Remdesivir - Pharmacy Brief Note   O:  ALT/AST (29/44) CXR: "Cardiomegaly with no acute process identified." SpO2: 93% on Room Air   A/P:  Remdesivir 200 mg IVPB once followed by 100 mg IVPB daily x 4 days.   Darrick Penna, PharmD, MS PGPM Clinical Pharmacist 09/08/2021 3:06 PM

## 2021-09-09 ENCOUNTER — Telehealth: Payer: Self-pay | Admitting: Internal Medicine

## 2021-09-09 DIAGNOSIS — E039 Hypothyroidism, unspecified: Secondary | ICD-10-CM

## 2021-09-09 DIAGNOSIS — U071 COVID-19: Principal | ICD-10-CM

## 2021-09-09 DIAGNOSIS — K219 Gastro-esophageal reflux disease without esophagitis: Secondary | ICD-10-CM

## 2021-09-09 DIAGNOSIS — I480 Paroxysmal atrial fibrillation: Secondary | ICD-10-CM

## 2021-09-09 DIAGNOSIS — R778 Other specified abnormalities of plasma proteins: Secondary | ICD-10-CM

## 2021-09-09 DIAGNOSIS — I10 Essential (primary) hypertension: Secondary | ICD-10-CM

## 2021-09-09 DIAGNOSIS — I5032 Chronic diastolic (congestive) heart failure: Secondary | ICD-10-CM

## 2021-09-09 DIAGNOSIS — F339 Major depressive disorder, recurrent, unspecified: Secondary | ICD-10-CM

## 2021-09-09 DIAGNOSIS — J069 Acute upper respiratory infection, unspecified: Secondary | ICD-10-CM

## 2021-09-09 LAB — CBC WITH DIFFERENTIAL/PLATELET
Abs Immature Granulocytes: 0.02 10*3/uL (ref 0.00–0.07)
Basophils Absolute: 0 10*3/uL (ref 0.0–0.1)
Basophils Relative: 0 %
Eosinophils Absolute: 0 10*3/uL (ref 0.0–0.5)
Eosinophils Relative: 0 %
HCT: 38.2 % (ref 36.0–46.0)
Hemoglobin: 12.4 g/dL (ref 12.0–15.0)
Immature Granulocytes: 1 %
Lymphocytes Relative: 20 %
Lymphs Abs: 0.8 10*3/uL (ref 0.7–4.0)
MCH: 29.1 pg (ref 26.0–34.0)
MCHC: 32.5 g/dL (ref 30.0–36.0)
MCV: 89.7 fL (ref 80.0–100.0)
Monocytes Absolute: 0.3 10*3/uL (ref 0.1–1.0)
Monocytes Relative: 7 %
Neutro Abs: 2.8 10*3/uL (ref 1.7–7.7)
Neutrophils Relative %: 72 %
Platelets: 177 10*3/uL (ref 150–400)
RBC: 4.26 MIL/uL (ref 3.87–5.11)
RDW: 14.2 % (ref 11.5–15.5)
WBC: 3.9 10*3/uL — ABNORMAL LOW (ref 4.0–10.5)
nRBC: 0 % (ref 0.0–0.2)

## 2021-09-09 LAB — COMPREHENSIVE METABOLIC PANEL
ALT: 22 U/L (ref 0–44)
AST: 36 U/L (ref 15–41)
Albumin: 3.2 g/dL — ABNORMAL LOW (ref 3.5–5.0)
Alkaline Phosphatase: 87 U/L (ref 38–126)
Anion gap: 8 (ref 5–15)
BUN: 22 mg/dL (ref 8–23)
CO2: 29 mmol/L (ref 22–32)
Calcium: 8.7 mg/dL — ABNORMAL LOW (ref 8.9–10.3)
Chloride: 103 mmol/L (ref 98–111)
Creatinine, Ser: 0.81 mg/dL (ref 0.44–1.00)
GFR, Estimated: >60 mL/min (ref >60)
Glucose, Bld: 118 mg/dL — ABNORMAL HIGH (ref 70–99)
Potassium: 4.4 mmol/L (ref 3.5–5.1)
Sodium: 140 mmol/L (ref 135–145)
Total Bilirubin: 0.5 mg/dL (ref 0.3–1.2)
Total Protein: 7.2 g/dL (ref 6.5–8.1)

## 2021-09-09 LAB — PHOSPHORUS: Phosphorus: 3.2 mg/dL (ref 2.5–4.6)

## 2021-09-09 LAB — C-REACTIVE PROTEIN: CRP: 3.8 mg/dL — ABNORMAL HIGH (ref <1.0)

## 2021-09-09 LAB — MAGNESIUM: Magnesium: 2.2 mg/dL (ref 1.7–2.4)

## 2021-09-09 LAB — FERRITIN: Ferritin: 249 ng/mL (ref 11–307)

## 2021-09-09 LAB — D-DIMER, QUANTITATIVE: D-Dimer, Quant: 0.39 ug/mL-FEU (ref 0.00–0.50)

## 2021-09-09 MED ORDER — SODIUM CHLORIDE 0.9 % IV SOLN: INTRAVENOUS | Status: DC | PRN

## 2021-09-09 MED ORDER — DM-GUAIFENESIN ER 30-600 MG PO TB12
1.0000 | ORAL_TABLET | Freq: Two times a day (BID) | ORAL | Status: DC
  Administered 2021-09-09 – 2021-09-10 (×3): 1 via ORAL
  Filled 2021-09-09 (×3): qty 1

## 2021-09-09 NOTE — Telephone Encounter (Signed)
I called and spoke with the patient. She is currently admitted for COVID. Per her report, symptoms started Sunday, but when she woke up yesterday morning (Wednesday), she was coughing, but could not cough anything up and was gasping for breath so she called 911. She was transported to [facility].  She is currently receiving Remdesivir.  She advised she had been on MyChart and seen her chest x-ray report showing and enlarged heart:  Results from 09/08/21 x-ray IMPRESSION: Cardiomegaly with no acute process identified.om 09/08/21 x-ray  She was also told she had elevated cardiac markers that were elevated, but told this was due to possible inflammation:  Troponin (09/08/21): 147 @ 1314 & 223 @ 1649 D-Dimer (09/08/21):  0.67 @ 1314 &  (09/09/21) 0.39 @ 0757  I have advised the patient that labs could all be elevated in light of her current COVID infection. She is aware, if needed, the hospitalist will call for a cardiology consult, otherwise, she has been encouraged to review these results further with her hospital physician.   She is aware I will forward this message to Dr. Graciela Husbands as an [place]ain Childes. She is scheduled to follow up with Dr. Graciela Husbands on 09/21/21 and advised to keep this appointment.  The patient voices understanding of all of the above and is agreeable.

## 2021-09-09 NOTE — Telephone Encounter (Signed)
Patient calling  Patient is in the hospital and is COVID + They are telling her there are issues with her heart and she would like to know if Dr Caryl Comes could take a look at her test results and advise  Please call to discuss

## 2021-09-09 NOTE — Progress Notes (Signed)
PROGRESS NOTE  [name] [id number] DOB: November 06, 1950   PCP: Deland Pretty, MD  Patient is from: Home.  Lives with son.  Uses cane at baseline.  DOA: 09/08/2021 LOS: 1  Chief complaints:  Chief Complaint  Patient presents with   Generalized Body Aches     Brief Narrative / Interim history: 71 year old F with PMH of diastolic CHF, CKD-3, paroxysmal A. fib on Eliquis, HTN, hypothyroidism, splenic infarction, osteoarthritis and anxiety presenting with dry cough, shortness of breath and myalgia for 4 days and admitted for acute respiratory failure with hypoxia in the setting of COVID-19 infection.  Started on Decadron and IV remdesivir.   Subjective: Seen and examined earlier this morning.  No major events overnight of this morning.  Endorses cough with very thick phlegm that is difficult to expectorate.  Denies shortness of breath at rest.  Denies chest pain, GI or UTI symptoms.  Denies focal neuro symptoms.  Objective: Vitals:   09/08/21 2230 09/08/21 2333 09/09/21 0455 09/09/21 0818  BP: (!) 184/83 (!) 154/69 (!) 141/66 (!) 145/60  Pulse: 66 (!) 57 (!) 47 (!) 53  Resp: (!) 21 18 16    Temp: 98.6 F (37 C) 97.6 F (36.4 C) (!) 97.5 F (36.4 C) (!) 97.5 F (36.4 C)  TempSrc: Oral Oral Oral Oral  SpO2: 95% 94% 96% 100%    Examination:  GENERAL: No apparent distress.  Nontoxic. HEENT: MMM.  Vision and hearing grossly intact.  NECK: Supple.  No apparent JVD.  RESP:  No IWOB.  Fair aeration bilaterally. CVS:  RRR. Heart sounds normal.  ABD/GI/GU: BS+. Abd soft, NTND.  MSK/EXT:  Moves extremities. No apparent deformity.  1+ BLE edema. SKIN: no apparent skin lesion or wound NEURO: Awake, alert and oriented appropriately.  No apparent focal neuro deficit. PSYCH: Calm. Normal affect.   Procedures:  None  Microbiology summarized: COVID-19 PCR positive. Influenza PCR negative.  Assessment & Plan: Acute respiratory failure with hypoxia due to COVID-19 infection:  Presented with somewhat dry cough, shortness of breath and myalgia for 4 days.  Vaccinated x2 with 1 booster.  Desaturated to 90% on RA on arrival.  Slightly elevated inflammatory markers.  CXR with cardiomegaly.  Pro-Cal negative arguing against bacterial infection. Recent Labs    09/08/21 1314 09/08/21 1649 09/09/21 0757  DDIMER 0.67*  --  0.39  FERRITIN 303  --  249  CRP  --  4.7* 3.8*  -Continue Decadron and remdesivir -Already on Eliquis. -Supportive care with inhalers, incentive telemetry, mucolytic's, antitussive -Ambulatory saturation  Elevated troponin: Likely demand ischemia in the setting of uncontrolled hypertension and possible myocarditis from COVID-19 infection.  Patient without chest pain.  Doubt indication for further work-up.    Chronic diastolic CHF: TTE in [id number] with LVEF of 65 to [id number], normal diastolic function and RVSP of 45 mmHg.  Takes Lasix 40 mg daily at home.  Appears euvolemic on exam except for 1+ BLE edema -Continue home Lasix -Monitor fluid status, respiratory status and renal functions. -Sodium and fluid restriction  Paroxysmal A. fib.  Rate controlled. Has PPM as well -Continue home Tambocor, metoprolol and Eliquis. -Optimize electrolytes    Essential hypertension: BP improved. -Continue home losartan, metoprolol and Lasix     Hypothyroidism -Continue levothyroxine 50 mg p.o. daily.   Recurrent major depression (HCC) -Continue Lexapro 5 mg p.o. daily.   Gastroesophageal reflux disease -Protonix 40 mg p.o. daily.   Class 3 obesity (HCC) There is no height or weight on file to calculate  BMI.         DVT prophylaxis:   apixaban (ELIQUIS) tablet 5 mg  Code Status: Full code Family Communication: Patient and/or RN. Available if any question.  Level of care: Telemetry Cardiac Status is: Inpatient  Remains inpatient appropriate because: COVID-19 infection       Consultants:     Sch Meds:  Scheduled Meds:  apixaban  5 mg Oral  BID   vitamin C  500 mg Oral Daily   cholecalciferol  2,000 Units Oral Daily   dexamethasone (DECADRON) injection  6 mg Intravenous Q24H   dextromethorphan-guaiFENesin  1 tablet Oral BID   escitalopram  5 mg Oral Daily   flecainide  100 mg Oral BID   furosemide  40 mg Oral q morning   levothyroxine  75 mcg Oral QAC breakfast   losartan  50 mg Oral Daily   metoprolol tartrate  100 mg Oral BID   zinc sulfate  220 mg Oral Daily   Continuous Infusions:  sodium chloride     remdesivir 100 mg in NS 100 mL 100 mg (09/09/21 0927)   PRN Meds:.sodium chloride, acetaminophen **OR** acetaminophen, albuterol, guaiFENesin-dextromethorphan, ondansetron **OR** ondansetron (ZOFRAN) IV  Antimicrobials: Anti-infectives (From admission, onward)    Start     Dose/Rate Route Frequency Ordered Stop   09/09/21 1000  remdesivir 100 mg in sodium chloride 0.9 % 100 mL IVPB       See Hyperspace for full Linked Orders Report.   100 mg 200 mL/hr over 30 Minutes Intravenous Daily 09/08/21 1501 09/13/21 0959   09/08/21 1600  remdesivir 200 mg in sodium chloride 0.9% 250 mL IVPB       See Hyperspace for full Linked Orders Report.   200 mg 580 mL/hr over 30 Minutes Intravenous Once 09/08/21 1501 09/08/21 1716        I have personally reviewed the following labs and images: CBC: Recent Labs  Lab 09/08/21 1314 09/09/21 0757  WBC 7.4 3.9*  NEUTROABS 5.5 2.8  HGB 13.2 12.4  HCT 41.4 38.2  MCV 91.0 89.7  PLT 197 177   BMP &GFR Recent Labs  Lab 09/08/21 1314 09/09/21 0757  NA 134* 140  K 4.4 4.4  CL 100 103  CO2 27 29  GLUCOSE 102* 118*  BUN 23 22  CREATININE 1.04* 0.81  CALCIUM 8.8* 8.7*  MG  --  2.2  PHOS  --  3.2   CrCl cannot be calculated (Unknown ideal weight.). Liver & Pancreas: Recent Labs  Lab 09/08/21 1314 09/09/21 0757  AST 44* 36  ALT 29 22  ALKPHOS  --  87  BILITOT  --  0.5  PROT  --  7.2  ALBUMIN  --  3.2*   No results for input(s): LIPASE, AMYLASE in the last 168  hours. No results for input(s): AMMONIA in the last 168 hours. Diabetic: No results for input(s): HGBA1C in the last 72 hours. No results for input(s): GLUCAP in the last 168 hours. Cardiac Enzymes: No results for input(s): CKTOTAL, CKMB, CKMBINDEX, TROPONINI in the last 168 hours. No results for input(s): PROBNP in the last 8760 hours. Coagulation Profile: No results for input(s): INR, PROTIME in the last 168 hours. Thyroid Function Tests: No results for input(s): TSH, T4TOTAL, FREET4, T3FREE, THYROIDAB in the last 72 hours. Lipid Profile: No results for input(s): CHOL, HDL, LDLCALC, TRIG, CHOLHDL, LDLDIRECT in the last 72 hours. Anemia Panel: Recent Labs    09/08/21 1314 09/09/21 0757  FERRITIN 303 249  Urine analysis:    Component Value Date/Time   COLORURINE COLORLESS (A) 09/08/2021 1314   APPEARANCEUR CLEAR (A) 09/08/2021 1314   LABSPEC 1.004 (L) 09/08/2021 1314   PHURINE 5.0 09/08/2021 1314   GLUCOSEU NEGATIVE 09/08/2021 1314   HGBUR NEGATIVE 09/08/2021 1314   BILIRUBINUR NEGATIVE 09/08/2021 1314   KETONESUR NEGATIVE 09/08/2021 1314   PROTEINUR NEGATIVE 09/08/2021 1314   NITRITE NEGATIVE 09/08/2021 1314   LEUKOCYTESUR NEGATIVE 09/08/2021 1314   Sepsis Labs: Invalid input(s): PROCALCITONIN, [place]  Microbiology: Recent Results (from the past 240 hour(s))  Resp Panel by RT-PCR (Flu A&B, Covid) Nasopharyngeal Swab     Status: Abnormal   Collection Time: 09/08/21 11:39 AM   Specimen: Nasopharyngeal Swab; Nasopharyngeal(NP) swabs in vial transport medium  Result Value Ref Range Status   SARS Coronavirus 2 by RT PCR POSITIVE (A) NEGATIVE Final    Comment: (NOTE) SARS-CoV-2 target nucleic acids are DETECTED.  The SARS-CoV-2 RNA is generally detectable in upper respiratory specimens during the acute phase of infection. Positive results are indicative of the presence of the identified virus, but do not rule out bacterial infection or co-infection with other  pathogens not detected by the test. Clinical correlation with patient history and other diagnostic information is necessary to determine patient infection status. The expected result is Negative.  Fact Sheet for Patients: EntrepreneurPulse.com.au  Fact Sheet for Healthcare Providers: IncredibleEmployment.be  This test is not yet approved or cleared by the [place] FDA and  has been authorized for detection and/or diagnosis of SARS-CoV-2 by FDA under an Emergency Use Authorization (EUA).  This EUA will remain in effect (meaning this test can be used) for the duration of  the COVID-19 declaration under Section 564(b)(1) of the A ct, 21 U.S.C. section 360bbb-3(b)(1), unless the authorization is terminated or revoked sooner.     Influenza A by PCR NEGATIVE NEGATIVE Final   Influenza B by PCR NEGATIVE NEGATIVE Final    Comment: (NOTE) The Xpert Xpress SARS-CoV-2/FLU/RSV plus assay is intended as an aid in the diagnosis of influenza from Nasopharyngeal swab specimens and should not be used as a sole basis for treatment. Nasal washings and aspirates are unacceptable for Xpert Xpress SARS-CoV-2/FLU/RSV testing.  Fact Sheet for Patients: EntrepreneurPulse.com.au  Fact Sheet for Healthcare Providers: IncredibleEmployment.be  This test is not yet approved or cleared by the [place] FDA and has been authorized for detection and/or diagnosis of SARS-CoV-2 by FDA under an Emergency Use Authorization (EUA). This EUA will remain in effect (meaning this test can be used) for the duration of the COVID-19 declaration under Section 564(b)(1) of the Act, 21 U.S.C. section 360bbb-3(b)(1), unless the authorization is terminated or revoked.  Performed at [facility], [address], [place], [place] [address]     Radiology Studies: DG Chest 2 View  Result Date: 09/08/2021 CLINICAL DATA:  COVID  positive EXAM: CHEST - 2 VIEW COMPARISON:  None. FINDINGS: Heart is enlarged. Mediastinum appears within normal limits. Left-sided atrial loop recorder. Pulmonary vasculature is normal. No focal consolidation identified. No pleural effusion or pneumothorax. IMPRESSION: Cardiomegaly with no acute process identified. Electronically Signed   By: Ofilia Neas M.D.   On: 09/08/2021 13:28       Serenah Mill T. Maramec  If 7PM-7AM, please contact night-coverage [email or web address] 09/09/2021, 12:24 PM

## 2021-09-09 NOTE — Evaluation (Signed)
Physical Therapy Evaluation Patient Details Name: [name] MRN: [id number] DOB: April 10, 1951 Today's Date: 09/09/2021  History of Present Illness  Pt is a 71 y.o. female presenting to hospital 1/18 with c/o generalized body aches, worsening cough, and SOB x4 days.  (+) COVID.  Pt admitted with acute respiratory failure with hypoxia d/t COVID-19 virus, elevated troponin (secondary to demand ischemia), chronic diastolic heart failure, and paroxysmal a-fib.  PMH includes a-fib on Eliquis, CHF, htn, hypothyroidism, h/o remote toacco abuse, anxiety, OA, and CKD.  Clinical Impression  Prior to hospital admission, pt was modified independent ambulating with [facility]; lives with her son in 1 level home with ramp to enter.  Currently pt is modified independent with bed mobility; modified independent with transfers; and modified independent ambulating 120 feet with SPC.  Mild increased SOB noted with ambulation (O2 sats 93% or greater on room air during sessions activities) but recovered within a couple minutes of sitting rest.  Educated pt on pacing and activity modification: pt verbalizing appropriate understanding.  Pt reports being ambulatory in hospital room throughout day and appears motivated to keep mobilizing on own.  No further acute or post acute PT needs anticipated; will sign off.      Recommendations for follow up therapy are one component of a multi-disciplinary discharge planning process, led by the attending physician.  Recommendations may be updated based on patient status, additional functional criteria and insurance authorization.  Follow Up Recommendations No PT follow up    Assistance Recommended at Discharge None  Patient can return home with the following       Equipment Recommendations None recommended by PT (pt has SPC already)  Recommendations for Other Services       Functional Status Assessment Patient has not had a recent decline in their functional status      Precautions / Restrictions Precautions Precautions: None Restrictions Weight Bearing Restrictions: No      Mobility  Bed Mobility Overal bed mobility: Modified Independent             General bed mobility comments: HOB elevated; mild increased effort to bring B LE's into bed on own.    Transfers Overall transfer level: Modified independent Equipment used: None, Straight cane               General transfer comment: steady safe transfers noted (x2 trials from bed)    Ambulation/Gait Ambulation/Gait assistance: Modified independent (Device/Increase time) Gait Distance (Feet): 120 Feet Assistive device: Straight cane Gait Pattern/deviations: Step-through pattern Gait velocity: mildly decreased     General Gait Details: mild increased R>L lateral sway; steady  Stairs            Wheelchair Mobility    Modified Rankin (Stroke Patients Only)       Balance Overall balance assessment: Needs assistance Sitting-balance support: No upper extremity supported, Feet supported Sitting balance-Leahy Scale: Normal Sitting balance - Comments: steady sitting reaching outside BOS   Standing balance support: No upper extremity supported, During functional activity Standing balance-Leahy Scale: Normal Standing balance comment: steady standing making bed                             Pertinent Vitals/Pain Pain Assessment Pain Assessment: No/denies pain HR 53-67 bpm during sessions activities.    Home Living Family/patient expects to be discharged to:: Private residence Living Arrangements: Children (pt's son (works 3-11pm)) Available Help at Discharge: Family Type of Home: House Home Access:  Ramped entrance       Home Layout: One level Home Equipment: Conservation officer, nature (2 wheels);Rollator (4 wheels);Cane - single point;Toilet riser;Shower seat - built in;Grab bars - toilet      Prior Function Prior Level of Function : Independent/Modified  Independent             Mobility Comments: Ambulatory with SPC.       Hand Dominance        Extremity/Trunk Assessment   Upper Extremity Assessment Upper Extremity Assessment: Overall WFL for tasks assessed    Lower Extremity Assessment Lower Extremity Assessment: Overall WFL for tasks assessed    Cervical / Trunk Assessment Cervical / Trunk Assessment: Other exceptions Cervical / Trunk Exceptions: forward head/shoulders  Communication   Communication: No difficulties  Cognition Arousal/Alertness: Awake/alert Behavior During Therapy: WFL for tasks assessed/performed Overall Cognitive Status: Within Functional Limits for tasks assessed                                          General Comments  Nursing cleared pt for participation in physical therapy.  Pt agreeable to PT session.     Exercises  Education on pacing/activity modification.   Assessment/Plan    PT Assessment Patient does not need any further PT services  PT Problem List         PT Treatment Interventions      PT Goals (Current goals can be found in the Care Plan section)  Acute Rehab PT Goals Patient Stated Goal: to improve breathing PT Goal Formulation: With patient Time For Goal Achievement: 09/23/21 Potential to Achieve Goals: Good    Frequency       Co-evaluation               AM-PAC PT "6 Clicks" Mobility  Outcome Measure Help needed turning from your back to your side while in a flat bed without using bedrails?: None Help needed moving from lying on your back to sitting on the side of a flat bed without using bedrails?: None Help needed moving to and from a bed to a chair (including a wheelchair)?: None Help needed standing up from a chair using your arms (e.g., wheelchair or bedside chair)?: None Help needed to walk in hospital room?: None Help needed climbing 3-5 steps with a railing? : A Little 6 Click Score: 23    End of Session Equipment Utilized  During Treatment: Gait belt Activity Tolerance: Patient tolerated treatment well Patient left: in bed;with call bell/phone within reach Nurse Communication: Mobility status;Precautions PT Visit Diagnosis: Muscle weakness (generalized) (M62.81)    Time: [id number] PT Time Calculation (min) (ACUTE ONLY): 27 min   Charges:   PT Evaluation $PT Eval Low Complexity: 1 Low PT Treatments $Therapeutic Activity: 8-22 mins       Leitha Bleak, PT 09/09/21, 2:26 PM

## 2021-09-09 NOTE — Evaluation (Signed)
Occupational Therapy Evaluation Patient Details Name: [name] MRN: [id number] DOB: Aug 14, 1951 Today's Date: 09/09/2021   History of Present Illness Pt is a 71 y.o. female presenting to hospital 1/18 with c/o generalized body aches, worsening cough, and SOB x4 days.  (+) COVID.  Pt admitted with acute respiratory failure with hypoxia d/t COVID-19 virus, elevated troponin (secondary to demand ischemia), chronic diastolic heart failure, and paroxysmal a-fib.  PMH includes a-fib on Eliquis, CHF, htn, hypothyroidism, h/o remote toacco abuse, anxiety, OA, and CKD.   Clinical Impression   Ms Strong was seen for OT evaluation this date. Prior to hospital admission, pt was MOD I using SPC as needed. Pt lives with son available PRN. Pt presents to acute OT demonstrating near baseline for ADLs and mobility. Pt currently requires increased time exiting flat bed and scooting up in bed - reports improved with use of grip socks> MAX A don/doff socks at bed level - instructed on adapted dressing techniques, DME (shower seat), AE (sock aid), ECS (handout provided), and HEP (red theraband provided). All education complete, will sign off. Upon hospital discharge, recommend n oOT follow up.       Recommendations for follow up therapy are one component of a multi-disciplinary discharge planning process, led by the attending physician.  Recommendations may be updated based on patient status, additional functional criteria and insurance authorization.   Follow Up Recommendations  No OT follow up    Assistance Recommended at Discharge Set up Supervision/Assistance  Patient can return home with the following Help with stairs or ramp for entrance    Functional Status Assessment  Patient has had a recent decline in their functional status and demonstrates the ability to make significant improvements in function in a reasonable and predictable amount of time.  Equipment Recommendations  Tub/shower seat     Recommendations for Other Services       Precautions / Restrictions Precautions Precautions: None Restrictions Weight Bearing Restrictions: No      Mobility Bed Mobility Overal bed mobility: Modified Independent             General bed mobility comments: flat bed    Transfers Overall transfer level: Modified independent                 General transfer comment: rail use      Balance Overall balance assessment: Needs assistance Sitting-balance support: No upper extremity supported, Feet supported Sitting balance-Leahy Scale: Normal Sitting balance - Comments: steady sitting reaching outside BOS   Standing balance support: No upper extremity supported, During functional activity Standing balance-Leahy Scale: Normal Standing balance comment: steady standing making bed                           ADL either performed or assessed with clinical judgement   ADL Overall ADL's : Needs assistance/impaired                                       General ADL Comments: MAX A don/doff B socks - instructed on sock aid. MOD I for ADL t/f.     Vision         Perception     Praxis      Pertinent Vitals/Pain Pain Assessment Pain Assessment: No/denies pain     Hand Dominance     Extremity/Trunk Assessment Upper Extremity Assessment Upper Extremity Assessment:  Overall [facility] for tasks assessed   Lower Extremity Assessment Lower Extremity Assessment: Overall WFL for tasks assessed   Cervical / Trunk Assessment Cervical / Trunk Assessment: Other exceptions Cervical / Trunk Exceptions: forward head/shoulders   Communication Communication Communication: No difficulties   Cognition Arousal/Alertness: Awake/alert Behavior During Therapy: WFL for tasks assessed/performed Overall Cognitive Status: Within Functional Limits for tasks assessed                                       General Comments       Exercises  Exercises: Other exercises Other Exercises Other Exercises: instructed in ECS, adapted equipment, and theraband HEP   Shoulder Instructions      Home Living Family/patient expects to be discharged to:: Private residence Living Arrangements: Children Available Help at Discharge: Family Type of Home: House Home Access: Ramped entrance     Home Layout: One level     Bathroom Shower/Tub: Producer, television/film/video: Standard     Home Equipment: Agricultural consultant (2 wheels);Rollator (4 wheels);Cane - single point;Toilet riser;Shower seat - built in;Grab bars - toilet          Prior Functioning/Environment Prior Level of Function : Independent/Modified Independent             Mobility Comments: Ambulatory with SPC. ADLs Comments: assist for socks/shoes        OT Problem List: Decreased activity tolerance         OT Goals(Current goals can be found in the care plan section) Acute Rehab OT Goals Patient Stated Goal: to go home OT Goal Formulation: With patient/family Time For Goal Achievement: 09/23/21 Potential to Achieve Goals: Good   AM-PAC OT "6 Clicks" Daily Activity     Outcome Measure Help from another person eating meals?: None Help from another person taking care of personal grooming?: None Help from another person toileting, which includes using toliet, bedpan, or urinal?: None Help from another person bathing (including washing, rinsing, drying)?: None Help from another person to put on and taking off regular upper body clothing?: None Help from another person to put on and taking off regular lower body clothing?: None 6 Click Score: 24   End of Session    Activity Tolerance: Patient tolerated treatment well Patient left: in bed;with call bell/phone within reach;with family/visitor present  OT Visit Diagnosis: Unsteadiness on feet (R26.81)                Time: 9767-3419 OT Time Calculation (min): 18 min Charges:  OT General Charges $OT Visit:  1 Visit OT Evaluation $OT Eval Low Complexity: 1 Low OT Treatments $Self Care/Home Management : 8-22 mins  Kathie Dike, M.S. OTR/L  09/09/21, 4:26 PM  ascom [phone]

## 2021-09-10 DIAGNOSIS — F419 Anxiety disorder, unspecified: Secondary | ICD-10-CM

## 2021-09-10 DIAGNOSIS — F32A Depression, unspecified: Secondary | ICD-10-CM

## 2021-09-10 LAB — RENAL FUNCTION PANEL
Albumin: 3.3 g/dL — ABNORMAL LOW (ref 3.5–5.0)
Anion gap: 6 (ref 5–15)
BUN: 28 mg/dL — ABNORMAL HIGH (ref 8–23)
CO2: 26 mmol/L (ref 22–32)
Calcium: 8.3 mg/dL — ABNORMAL LOW (ref 8.9–10.3)
Chloride: 104 mmol/L (ref 98–111)
Creatinine, Ser: 0.94 mg/dL (ref 0.44–1.00)
GFR, Estimated: >60 mL/min (ref >60)
Glucose, Bld: 110 mg/dL — ABNORMAL HIGH (ref 70–99)
Phosphorus: 3.1 mg/dL (ref 2.5–4.6)
Potassium: 4 mmol/L (ref 3.5–5.1)
Sodium: 136 mmol/L (ref 135–145)

## 2021-09-10 LAB — CBC
HCT: 38.8 % (ref 36.0–46.0)
Hemoglobin: 12.8 g/dL (ref 12.0–15.0)
MCH: 29.2 pg (ref 26.0–34.0)
MCHC: 33 g/dL (ref 30.0–36.0)
MCV: 88.4 fL (ref 80.0–100.0)
Platelets: 220 10*3/uL (ref 150–400)
RBC: 4.39 MIL/uL (ref 3.87–5.11)
RDW: 14.5 % (ref 11.5–15.5)
WBC: 8.1 10*3/uL (ref 4.0–10.5)
nRBC: 0 % (ref 0.0–0.2)

## 2021-09-10 LAB — C-REACTIVE PROTEIN: CRP: 2.4 mg/dL — ABNORMAL HIGH (ref <1.0)

## 2021-09-10 LAB — MAGNESIUM: Magnesium: 2.3 mg/dL (ref 1.7–2.4)

## 2021-09-10 MED ORDER — GUAIFENESIN ER 600 MG PO TB12
1200.0000 mg | ORAL_TABLET | Freq: Two times a day (BID) | ORAL | 0 refills | Status: AC
Start: 2021-09-10 — End: 2021-09-15

## 2021-09-10 MED ORDER — LOSARTAN POTASSIUM 50 MG PO TABS
100.0000 mg | ORAL_TABLET | Freq: Every day | ORAL | Status: DC
  Administered 2021-09-10: 100 mg via ORAL
  Filled 2021-09-10: qty 2

## 2021-09-10 MED ORDER — LOSARTAN POTASSIUM 100 MG PO TABS: 100.0000 mg | ORAL_TABLET | Freq: Every day | ORAL | 1 refills | Status: DC

## 2021-09-10 NOTE — Care Management Important Message (Signed)
Important Message  Patient Details  Name: [name] MRN: [id number] Date of Birth: Jul 24, 1951   Medicare Important Message Given:  N/A - LOS <3 / Initial given by admissions     [name] 09/10/2021, 10:06 AM

## 2021-09-10 NOTE — Progress Notes (Signed)
SATURATION QUALIFICATIONS: (This note is used to comply with regulatory documentation for home oxygen)  Patient Saturations on Room Air at Rest = 89%  While ambulating in room, patient O2 dropped down to 89% for a couple seconds on RA, but rebounded quickly with purse lip/deep breathing to 92-94%.

## 2021-09-10 NOTE — Discharge Summary (Signed)
Physician Discharge Summary  [name] ZOX:[name] DOB: 12/17/1950 DOA: 09/08/2021  PCP: Merri BrunettePharr, Walter, MD  Admit date: 09/08/2021 Discharge date: 09/10/2021 Admitted From: Home Disposition: Home Recommendations for Outpatient Follow-up:  Follow ups as below. Please obtain CBC/BMP/Mag at follow up Reassess blood pressure and adjust antihypertensive meds. Please follow up on the following pending results: None Home Health: Not indicated Equipment/Devices: Not indicated Discharge Condition: Stable CODE STATUS: Full code  Hospital Course: 71 year old F with PMH of diastolic CHF, CKD-3, paroxysmal A. fib on Eliquis, HTN, hypothyroidism, splenic infarction, osteoarthritis, venous insufficiency and anxiety presenting with dry cough, shortness of breath and myalgia for 4 days and admitted for acute respiratory failure with hypoxia in the setting of COVID-19 infection.  Started on Decadron and IV remdesivir.   Patient completed 3 days of IV remdesivir and Decadron with resolution of respiratory failure.  Ambulated on room air and maintain appropriate saturation.  No need identified by therapy  See individual problem list below for more on hospital course.  Discharge Diagnoses:  Acute respiratory failure with hypoxia due to COVID-19 infection: Presented with somewhat dry cough, shortness of breath and myalgia for 4 days.  Vaccinated x2 with 1 booster.  Desaturated to 90% on RA on arrival.  Slightly elevated inflammatory markers.  CXR with cardiomegaly.  Pro-Cal negative arguing against bacterial infection.  Respiratory failure resolved.  Ambulated on room air and maintain appropriate saturation. -Completed 3 days of Decadron and remdesivir -Already on Eliquis for A. fib. -Mucinex as needed -Counseled on return precaution and infection prevention   Elevated troponin: Likely demand ischemia in the setting of uncontrolled hypertension and possible myocarditis from COVID-19 infection.   Patient without chest pain.  Doubt indication for further work-up.  Patient has upcoming outpatient follow-up with cardiology.    Chronic diastolic CHF: TTE in 2019 with LVEF of 65 to 70%, normal diastolic function and RVSP of 45 mmHg.  Takes Lasix 40 mg daily at home.  Appears euvolemic on exam except for trace BLE edema with chronic venous insufficiency.  -Continue home Lasix.   Paroxysmal A. fib.  Rate controlled. Has PPM as well -Continue home Tambocor, metoprolol and Eliquis. -Optimize electrolytes    Essential hypertension: BP elevated. -Increased home losartan 200 mg daily -Continue home metoprolol and Lasix.    Hypothyroidism -Continue levothyroxine 50 mg p.o. daily.   Recurrent major depression (HCC) -Continue Lexapro 5 mg p.o. daily.   Gastroesophageal reflux disease -Protonix 40 mg p.o. daily.   Class 3 obesity (HCC) Body mass index is 52.31 kg/m.  -Encourage lifestyle change to lose weight         Discharge Exam: Vitals:   09/10/21 0019 09/10/21 0518 09/10/21 0751 09/10/21 1147  BP: (!) 151/72 (!) 156/73 (!) 171/86 (!) 164/79  Pulse: (!) 51 (!) 48 (!) 50 (!) 45  Temp: 98.1 F (36.7 C) 98.6 F (37 C) 97.7 F (36.5 C) 97.8 F (36.6 C)  Resp: 14 17 18 18   Height:      SpO2: 92% 95% 97% 97%  TempSrc:    Oral     GENERAL: No apparent distress.  Nontoxic. HEENT: MMM.  Vision and hearing grossly intact.  NECK: Supple.  No apparent JVD.  RESP: 97% on RA.  No IWOB.  Fair aeration bilaterally. CVS:  RRR. Heart sounds normal.  ABD/GI/GU: Bowel sounds present. Soft. Non tender.  MSK/EXT:  Moves extremities. No apparent deformity.  Trace BLE edema with chronic venous insufficiency. SKIN: no apparent skin lesion or  wound NEURO: Awake and alert.  Oriented appropriately.  No apparent focal neuro deficit. PSYCH: Calm. Normal affect.   Discharge Instructions  Discharge Instructions     Call MD for:  difficulty breathing, headache or visual disturbances    Complete by: As directed    Call MD for:  extreme fatigue   Complete by: As directed    Diet - low sodium heart healthy   Complete by: As directed    Discharge instructions   Complete by: As directed    It has been a pleasure taking care of you!  You were hospitalized due to COVID-19 infection for which you have been treated with steroid and remdesivir infusion.  Your symptoms improved and we expect them to continue to improve.  However, you are still contagious  and we recommend wearing facemask and using appropriate hand sanitizer for the next 7 days.   Follow-up with your primary care doctor in 1 to 2 weeks or sooner if needed.   Take care,   Increase activity slowly   Complete by: As directed       Allergies as of 09/10/2021       Reactions   Augmentin [amoxicillin-pot Clavulanate] Hives, Itching   Has patient had a PCN reaction causing immediate rash, facial/tongue/throat swelling, SOB or lightheadedness with hypotension:No Has patient had a PCN reaction causing severe rash involving mucus membranes or skin necrosis:No Has patient had a PCN reaction that required hospitalization:No Has patient had a PCN reaction occurring within the last 10 years:No If all of the above answers are "NO", then may proceed with Cephalosporin use.   Pneumococcal Vaccines Other (See Comments)   Caused fever, and swelling at injection site        Medication List     TAKE these medications    acetaminophen 500 MG tablet Commonly known as: TYLENOL Take 1,000 mg by mouth at bedtime.   ascorbic acid 500 MG tablet Commonly known as: VITAMIN C Take 500 mg by mouth daily.   diltiazem 30 MG tablet Commonly known as: Cardizem Take 1 tablet every 4 hours AS NEEDED for heart rate >100 as long as blood pressure >100.   Eliquis 5 MG Tabs tablet Generic drug: apixaban TAKE 1 TABLET BY MOUTH 2 TIMES DAILY.   escitalopram 5 MG tablet Commonly known as: LEXAPRO Take 5 mg by mouth daily.    flecainide 100 MG tablet Commonly known as: TAMBOCOR TAKE 1 TABLET BY MOUTH 2 TIMES A DAY   furosemide 20 MG tablet Commonly known as: LASIX Take 40 mg by mouth every morning.   guaiFENesin 600 MG 12 hr tablet Commonly known as: Mucinex Take 2 tablets (1,200 mg total) by mouth 2 (two) times daily for 5 days.   levothyroxine 75 MCG tablet Commonly known as: SYNTHROID Take 75 mcg by mouth daily before breakfast.   losartan 100 MG tablet Commonly known as: COZAAR Take 1 tablet (100 mg total) by mouth daily. What changed:  medication strength how much to take   metoprolol tartrate 100 MG tablet Commonly known as: LOPRESSOR Take 100 mg by mouth 2 (two) times daily.   Vitamin D (Cholecalciferol) 25 MCG (1000 UT) Tabs Take 2,000 Units by mouth daily.        Consultations: None  Procedures/Studies:   DG Chest 2 View  Result Date: 09/08/2021 CLINICAL DATA:  COVID positive EXAM: CHEST - 2 VIEW COMPARISON:  None. FINDINGS: Heart is enlarged. Mediastinum appears within normal limits. Left-sided atrial loop recorder. Pulmonary  vasculature is normal. No focal consolidation identified. No pleural effusion or pneumothorax. IMPRESSION: Cardiomegaly with no acute process identified. Electronically Signed   By: Jannifer Hick M.D.   On: 09/08/2021 13:28       The results of significant diagnostics from this hospitalization (including imaging, microbiology, ancillary and laboratory) are listed below for reference.     Microbiology: Recent Results (from the past 240 hour(s))  Resp Panel by RT-PCR (Flu A&B, Covid) Nasopharyngeal Swab     Status: Abnormal   Collection Time: 09/08/21 11:39 AM   Specimen: Nasopharyngeal Swab; Nasopharyngeal(NP) swabs in vial transport medium  Result Value Ref Range Status   SARS Coronavirus 2 by RT PCR POSITIVE (A) NEGATIVE Final    Comment: (NOTE) SARS-CoV-2 target nucleic acids are DETECTED.  The SARS-CoV-2 RNA is generally detectable in  upper respiratory specimens during the acute phase of infection. Positive results are indicative of the presence of the identified virus, but do not rule out bacterial infection or co-infection with other pathogens not detected by the test. Clinical correlation with patient history and other diagnostic information is necessary to determine patient infection status. The expected result is Negative.  Fact Sheet for Patients: BloggerCourse.com  Fact Sheet for Healthcare Providers: SeriousBroker.it  This test is not yet approved or cleared by the [place] FDA and  has been authorized for detection and/or diagnosis of SARS-CoV-2 by FDA under an Emergency Use Authorization (EUA).  This EUA will remain in effect (meaning this test can be used) for the duration of  the COVID-19 declaration under Section 564(b)(1) of the A ct, 21 U.S.C. section 360bbb-3(b)(1), unless the authorization is terminated or revoked sooner.     Influenza A by PCR NEGATIVE NEGATIVE Final   Influenza B by PCR NEGATIVE NEGATIVE Final    Comment: (NOTE) The Xpert Xpress SARS-CoV-2/FLU/RSV plus assay is intended as an aid in the diagnosis of influenza from Nasopharyngeal swab specimens and should not be used as a sole basis for treatment. Nasal washings and aspirates are unacceptable for Xpert Xpress SARS-CoV-2/FLU/RSV testing.  Fact Sheet for Patients: BloggerCourse.com  Fact Sheet for Healthcare Providers: SeriousBroker.it  This test is not yet approved or cleared by the [place] FDA and has been authorized for detection and/or diagnosis of SARS-CoV-2 by FDA under an Emergency Use Authorization (EUA). This EUA will remain in effect (meaning this test can be used) for the duration of the COVID-19 declaration under Section 564(b)(1) of the Act, 21 U.S.C. section 360bbb-3(b)(1), unless the authorization is  terminated or revoked.  Performed at [facility], [address] [place], [place] [address]      Labs:  CBC: Recent Labs  Lab 09/08/21 1314 09/09/21 0757 09/10/21 0649  WBC 7.4 3.9* 8.1  NEUTROABS 5.5 2.8  --   HGB 13.2 12.4 12.8  HCT 41.4 38.2 38.8  MCV 91.0 89.7 88.4  PLT 197 177 220   BMP &GFR Recent Labs  Lab 09/08/21 1314 09/09/21 0757 09/10/21 0649  NA 134* 140 136  K 4.4 4.4 4.0  CL 100 103 104  CO2 27 29 26   GLUCOSE 102* 118* 110*  BUN 23 22 28*  CREATININE 1.04* 0.81 0.94  CALCIUM 8.8* 8.7* 8.3*  MG  --  2.2 2.3  PHOS  --  3.2 3.1   CrCl cannot be calculated (Unknown ideal weight.). Liver & Pancreas: Recent Labs  Lab 09/08/21 1314 09/09/21 0757 09/10/21 0649  AST 44* 36  --   ALT 29 22  --  ALKPHOS  --  87  --   BILITOT  --  0.5  --   PROT  --  7.2  --   ALBUMIN  --  3.2* 3.3*   No results for input(s): LIPASE, AMYLASE in the last 168 hours. No results for input(s): AMMONIA in the last 168 hours. Diabetic: No results for input(s): HGBA1C in the last 72 hours. No results for input(s): GLUCAP in the last 168 hours. Cardiac Enzymes: No results for input(s): CKTOTAL, CKMB, CKMBINDEX, TROPONINI in the last 168 hours. No results for input(s): PROBNP in the last 8760 hours. Coagulation Profile: No results for input(s): INR, PROTIME in the last 168 hours. Thyroid Function Tests: No results for input(s): TSH, T4TOTAL, FREET4, T3FREE, THYROIDAB in the last 72 hours. Lipid Profile: No results for input(s): CHOL, HDL, LDLCALC, TRIG, CHOLHDL, LDLDIRECT in the last 72 hours. Anemia Panel: Recent Labs    09/08/21 1314 09/09/21 0757  FERRITIN 303 249   Urine analysis:    Component Value Date/Time   COLORURINE COLORLESS (A) 09/08/2021 1314   APPEARANCEUR CLEAR (A) 09/08/2021 1314   LABSPEC 1.004 (L) 09/08/2021 1314   PHURINE 5.0 09/08/2021 1314   GLUCOSEU NEGATIVE 09/08/2021 1314   HGBUR NEGATIVE 09/08/2021 1314   BILIRUBINUR  NEGATIVE 09/08/2021 1314   KETONESUR NEGATIVE 09/08/2021 1314   PROTEINUR NEGATIVE 09/08/2021 1314   NITRITE NEGATIVE 09/08/2021 1314   LEUKOCYTESUR NEGATIVE 09/08/2021 1314   Sepsis Labs: Invalid input(s): PROCALCITONIN, LACTICIDVEN   Time coordinating discharge: 45 minutes  SIGNED:  Almon Hercules, MD  [facility] 09/10/2021, 3:56 PM

## 2021-09-11 ENCOUNTER — Other Ambulatory Visit: Payer: Self-pay | Admitting: Internal Medicine

## 2021-09-11 DIAGNOSIS — I480 Paroxysmal atrial fibrillation: Secondary | ICD-10-CM

## 2021-09-13 NOTE — Telephone Encounter (Signed)
Eliquis 5mg  refill request received. Patient is 71 years old, weight-151.1kg, Crea-0.94 on 09/10/2021, Diagnosis-Afib, and last seen by Dr. 09/12/2021 on 03/11/2021. Dose is appropriate based on dosing criteria. Will send in refill to requested pharmacy.

## 2021-09-20 ENCOUNTER — Telehealth: Payer: Self-pay | Admitting: Internal Medicine

## 2021-09-20 NOTE — Telephone Encounter (Signed)
Patient admitted due to issues with covid ( 09-05-21 positive) .    Patient tested from home and is still positive as of yesterday.  Patient only complains of continued cough and denies any new exposures or symptoms.   Confirmed patient appt for dr. Graciela Husbands tomorrow.

## 2021-09-20 NOTE — Telephone Encounter (Signed)
Reviewed the patient's chart. She started with symptoms of COVID-19 on Sunday 09/05/21 and went to the ER on 09/08/21 where this was confirmed. The patient has had an ongoing cough, shortness of breath that caused her to follow up in the ER.    She is due to see Dr. Graciela Husbands in the office tomorrow and called to confirm that she is still testing + for COVID, however, symptoms are stable.  I have advised the patient that she may continue to test + for an unknown amount of time as well as continue with her cough.  I have advised the patient that we will see her in the office as planned tomorrow. She is advised that we are still masking within the clinical setting.  The patient voices understanding and is agreeable.

## 2021-09-21 ENCOUNTER — Encounter: Payer: Self-pay | Admitting: Internal Medicine

## 2021-09-21 ENCOUNTER — Other Ambulatory Visit: Payer: Self-pay

## 2021-09-21 ENCOUNTER — Ambulatory Visit (INDEPENDENT_AMBULATORY_CARE_PROVIDER_SITE_OTHER): Payer: PPO | Admitting: Internal Medicine

## 2021-09-21 VITALS — BP 186/97 | HR 56 | Ht 67.0 in | Wt 339.0 lb

## 2021-09-21 DIAGNOSIS — Z9889 Other specified postprocedural states: Secondary | ICD-10-CM

## 2021-09-21 DIAGNOSIS — I480 Paroxysmal atrial fibrillation: Secondary | ICD-10-CM | POA: Diagnosis not present

## 2021-09-21 DIAGNOSIS — I5032 Chronic diastolic (congestive) heart failure: Secondary | ICD-10-CM

## 2021-09-21 LAB — PACEMAKER DEVICE OBSERVATION

## 2021-09-21 MED ORDER — METOPROLOL TARTRATE 50 MG PO TABS: 50.0000 mg | ORAL_TABLET | Freq: Two times a day (BID) | ORAL | 3 refills | Status: DC

## 2021-09-21 NOTE — Progress Notes (Signed)
Patient ID: [name], female   DOB: 09/12/1950, 71 y.o.   MRN: [id number]       Patient Care Team: [name], MD as PCP - General (Internal Medicine)   HPI  [name] is a 71 y.o. 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  Intercurrently  diagnosed with atrial fibrillation with symptomatic recurrences associated with dyspnea.  She was scheduled for cardioversion and reverted spontaneously (6/22)  On Flecainide.   Now on anticoagulation (apixaban) and is on Squibb patient support; no bleeding.    2019 negative sleep study  Patient currently hospitalized with COVID.  Received remdesivir and steroids.  Feeling better.  No edema.  Chronic dyspnea.  No chest pain.  Heart rates remain slow.  Blood pressures at home in the 130 range.  DATE TEST EF   4/19 Echo   65-70 % LA size ULN          Date Cr K Hgb  11/21 0.91 4.0 14.0   12/21 0.84 4.2   4/22   0.83 5.4 13.3  1/23 0.94 4.0 12.8    DATE PR interval QRSduration Dose  12/21  184 118 0  7/22 188 120 100  1/23 190 128 100     Thromboembolic risk factors ( age -40, HTN-1, TIA/CVA-2, Gender-1) for a CHADSVASc Score of >=5  Past Medical History:  Diagnosis Date   Anxiety disorder 09/08/2021   Arthritis    CHF (congestive heart failure) (HCC)    Chronic diastolic heart failure (HCC) 09/08/2021   Chronic kidney disease due to hypertension 09/08/2021   Essential hypertension 10/15/2015   Gastroesophageal reflux disease 09/08/2021   Heart murmur    Hypertension    Hypothyroidism 10/15/2015   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   OA (osteoarthritis) of knee 04/04/2016   Paroxysmal atrial fibrillation (HCC) 09/08/2021   Tobacco user 09/08/2021   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"   Vitamin D deficiency 09/08/2021    Past Surgical History:  Procedure Laterality Date   CARDIOVERSION N/A 02/16/2021   Procedure: CARDIOVERSION;  Surgeon: Duke Salvia, MD;  Location: [facility] ORS;  Service: Cardiovascular;  Laterality: N/A;   CHOLECYSTECTOMY     DILATION AND CURETTAGE OF UTERUS     LOOP RECORDER INSERTION N/A 12/21/2017   Procedure: LOOP RECORDER INSERTION;  Surgeon: Duke Salvia, MD;  Location: [facility] INVASIVE CV LAB;  Service: Cardiovascular;  Laterality: N/A;   OMENTECTOMY N/A 10/12/2015   Procedure: PARTIAL OMENTECTOMY;  Surgeon: Violeta Gelinas, MD;  Location: [facility] OR;  Service: General;  Laterality: N/A;   TOTAL KNEE ARTHROPLASTY Left 04/04/2016   Procedure: LEFT TOTAL KNEE ARTHROPLASTY;  Surgeon: Ollen Gross, MD;  Location: [facility] ORS;  Service: Orthopedics;  Laterality: Left;   TOTAL KNEE ARTHROPLASTY Right 08/01/2016   Procedure: TOTAL KNEE ARTHROPLASTY;  Surgeon: Ollen Gross, MD;  Location: [facility] ORS;  Service: Orthopedics;  Laterality: Right;   UMBILICAL HERNIA REPAIR N/A 10/12/2015   Procedure: HERNIA REPAIR UMBILICAL ADULT/INCARERATED;  Surgeon: Violeta Gelinas, MD;  Location: [facility] OR;  Service: General;  Laterality: N/A;    Current Meds  Medication Sig   acetaminophen (TYLENOL) 500 MG tablet Take 1,000 mg by mouth at bedtime.   apixaban (ELIQUIS) 5 MG TABS tablet TAKE 1 TABLET BY MOUTH 2 TIMES DAILY.   ascorbic acid (VITAMIN C) 500 MG tablet Take 500 mg by mouth daily.   diltiazem (CARDIZEM) 30 MG tablet Take  1 tablet every 4 hours AS NEEDED for heart rate >100 as long as blood pressure >100.   escitalopram (LEXAPRO) 5 MG tablet Take 5 mg by mouth daily.   flecainide (TAMBOCOR) 100 MG tablet TAKE 1 TABLET BY MOUTH 2 TIMES A DAY   furosemide (LASIX) 20 MG tablet Take 40 mg by mouth every morning.    levothyroxine (SYNTHROID, LEVOTHROID) 75 MCG tablet Take 75 mcg by mouth daily before breakfast.    losartan (COZAAR) 100 MG tablet Take 1 tablet (100 mg total) by mouth daily.   metoprolol (LOPRESSOR) 100 MG tablet Take 100 mg by mouth 2 (two) times daily.    Vitamin D, Cholecalciferol, 1000 units TABS Take 2,000 Units by mouth daily.     Allergies  Allergen Reactions   Augmentin [Amoxicillin-Pot Clavulanate] Hives and Itching    Has patient had a PCN reaction causing immediate rash, facial/tongue/throat swelling, SOB or lightheadedness with hypotension:No Has patient had a PCN reaction causing severe rash involving mucus membranes or skin necrosis:No Has patient had a PCN reaction that required hospitalization:No Has patient had a PCN reaction occurring within the last 10 years:No If all of the above answers are "NO", then may proceed with Cephalosporin use.    Pneumococcal Vaccines Other (See Comments)    Caused fever, and swelling at injection site   Review of Systems negative except from HPI and PMH  Physical Exam BP (!) 186/97 (BP Location: Right Arm, Patient Position: Sitting, Cuff Size: Normal)    Pulse (!) 56    Ht 5\' 7"  (1.702 m)    Wt (!) 339 lb (153.8 kg)    SpO2 97%    BMI 53.09 kg/m  Well developed and Morbidly obese  in no acute distress HENT normal Neck supple with JVP-  flat  Clear Regular rate and rhythm, no murmurs or gallops Abd-soft with active BS No Clubbing cyanosis edema Skin-warm and dry A & Oriented  Grossly normal sensory and motor function  ECG sinus at 56 Interval 20/13/48 Left axis deviation -60    Assessment and  Plan  Splenic infarct  Atrial fibrillation-persistent   Sinus bradycardia less than 5% of her beats greater than 70  Sleep disordered breathing with a negative sleep study  Morbid obesity   Hypertension-whitecoat  Depression  COVID interval 1/23   Atrial fibrillation quiescient, continue flecainide 100 mg bid.  No worsening of conduction delay.    Continue anticoagulation with apixaban 5 mg bid.  BP reasonably controlled at home although in the hospital apparently and here again she has had significant elevated blood pressure.  Continue metoprolol but will decrease from 100 bid to 50 twice daily because of the bradycardia.  She reminds me that she has  been on metoprolol for 35 years.  Continue losartan which was increased to the hospital from 50--100 daily she is to let 2/23 know how her blood pressure does with the decrease in metoprolol; I suspect she may need more therapy and would anticipate either spironolactone or  amlodipine       *

## 2021-09-21 NOTE — Patient Instructions (Signed)
Medication Instructions:  Your physician has recommended you make the following change in your medication:   ** Begin Metoprolol 50mg  - 1 tablet by mouth twice daily.  *If you need a refill on your cardiac medications before your next appointment, please call your pharmacy*   Lab Work: None ordered.  If you have labs (blood work) drawn today and your tests are completely normal, you will receive your results only by: MyChart Message (if you have MyChart) OR A paper copy in the mail If you have any lab test that is abnormal or we need to change your treatment, we will call you to review the results.   Testing/Procedures: None ordered.    Follow-Up: At [facility], you and your health needs are our priority.  As part of our continuing mission to provide you with exceptional heart care, we have created designated Provider Care Teams.  These Care Teams include your primary Cardiologist (physician) and Advanced Practice Providers (APPs -  Physician Assistants and Nurse Practitioners) who all work together to provide you with the care you need, when you need it.  We recommend signing up for the patient portal called "MyChart".  Sign up information is provided on this After Visit Summary.  MyChart is used to connect with patients for Virtual Visits (Telemedicine).  Patients are able to view lab/test results, encounter notes, upcoming appointments, etc.  Non-urgent messages can be sent to your provider as well.   To learn more about what you can do with MyChart, go to [facility].    Your next appointment:   Follow up with Dr [email or web address] in 6 months

## 2021-09-23 DIAGNOSIS — F17200 Nicotine dependence, unspecified, uncomplicated: Secondary | ICD-10-CM | POA: Diagnosis not present

## 2021-09-23 DIAGNOSIS — Z6841 Body Mass Index (BMI) 40.0 and over, adult: Secondary | ICD-10-CM | POA: Diagnosis not present

## 2021-09-23 DIAGNOSIS — I110 Hypertensive heart disease with heart failure: Secondary | ICD-10-CM | POA: Diagnosis not present

## 2021-09-23 DIAGNOSIS — D692 Other nonthrombocytopenic purpura: Secondary | ICD-10-CM | POA: Diagnosis not present

## 2021-09-23 DIAGNOSIS — Z7901 Long term (current) use of anticoagulants: Secondary | ICD-10-CM | POA: Diagnosis not present

## 2021-09-23 DIAGNOSIS — I509 Heart failure, unspecified: Secondary | ICD-10-CM | POA: Diagnosis not present

## 2021-09-23 DIAGNOSIS — D6869 Other thrombophilia: Secondary | ICD-10-CM | POA: Diagnosis not present

## 2021-09-23 DIAGNOSIS — I4819 Other persistent atrial fibrillation: Secondary | ICD-10-CM | POA: Diagnosis not present

## 2021-10-04 ENCOUNTER — Other Ambulatory Visit: Payer: Self-pay | Admitting: Internal Medicine

## 2021-10-13 DIAGNOSIS — I4819 Other persistent atrial fibrillation: Secondary | ICD-10-CM | POA: Diagnosis not present

## 2021-10-13 DIAGNOSIS — Z87891 Personal history of nicotine dependence: Secondary | ICD-10-CM | POA: Diagnosis not present

## 2021-10-13 DIAGNOSIS — I110 Hypertensive heart disease with heart failure: Secondary | ICD-10-CM | POA: Diagnosis not present

## 2021-10-13 DIAGNOSIS — F3342 Major depressive disorder, recurrent, in full remission: Secondary | ICD-10-CM | POA: Diagnosis not present

## 2021-10-13 DIAGNOSIS — D692 Other nonthrombocytopenic purpura: Secondary | ICD-10-CM | POA: Diagnosis not present

## 2021-10-13 DIAGNOSIS — M545 Low back pain, unspecified: Secondary | ICD-10-CM | POA: Diagnosis not present

## 2021-10-13 DIAGNOSIS — Z6841 Body Mass Index (BMI) 40.0 and over, adult: Secondary | ICD-10-CM | POA: Diagnosis not present

## 2021-10-13 DIAGNOSIS — R1032 Left lower quadrant pain: Secondary | ICD-10-CM | POA: Diagnosis not present

## 2021-10-13 DIAGNOSIS — Z7901 Long term (current) use of anticoagulants: Secondary | ICD-10-CM | POA: Diagnosis not present

## 2021-10-13 DIAGNOSIS — I509 Heart failure, unspecified: Secondary | ICD-10-CM | POA: Diagnosis not present

## 2021-10-13 DIAGNOSIS — D6869 Other thrombophilia: Secondary | ICD-10-CM | POA: Diagnosis not present

## 2021-10-18 ENCOUNTER — Emergency Department: Payer: PPO

## 2021-10-18 ENCOUNTER — Other Ambulatory Visit: Payer: Self-pay

## 2021-10-18 ENCOUNTER — Inpatient Hospital Stay
Admission: EM | Admit: 2021-10-18 | Discharge: 2021-10-25 | DRG: 812 | Disposition: A | Discharge status: Skilled Nursing Facility | Payer: PPO | Attending: Student in an Organized Health Care Education/Training Program | Admitting: Student in an Organized Health Care Education/Training Program

## 2021-10-18 DIAGNOSIS — R21 Rash and other nonspecific skin eruption: Secondary | ICD-10-CM | POA: Diagnosis present

## 2021-10-18 DIAGNOSIS — I110 Hypertensive heart disease with heart failure: Secondary | ICD-10-CM | POA: Diagnosis present

## 2021-10-18 DIAGNOSIS — K5730 Diverticulosis of large intestine without perforation or abscess without bleeding: Secondary | ICD-10-CM | POA: Diagnosis not present

## 2021-10-18 DIAGNOSIS — E66813 Obesity, class 3: Secondary | ICD-10-CM | POA: Diagnosis present

## 2021-10-18 DIAGNOSIS — Z887 Allergy status to serum and vaccine status: Secondary | ICD-10-CM

## 2021-10-18 DIAGNOSIS — D649 Anemia, unspecified: Secondary | ICD-10-CM | POA: Diagnosis not present

## 2021-10-18 DIAGNOSIS — D62 Acute posthemorrhagic anemia: Principal | ICD-10-CM

## 2021-10-18 DIAGNOSIS — M1990 Unspecified osteoarthritis, unspecified site: Secondary | ICD-10-CM | POA: Diagnosis present

## 2021-10-18 DIAGNOSIS — I959 Hypotension, unspecified: Secondary | ICD-10-CM | POA: Diagnosis present

## 2021-10-18 DIAGNOSIS — Z8616 Personal history of COVID-19: Secondary | ICD-10-CM | POA: Diagnosis not present

## 2021-10-18 DIAGNOSIS — Z79890 Hormone replacement therapy (postmenopausal): Secondary | ICD-10-CM

## 2021-10-18 DIAGNOSIS — S80822A Blister (nonthermal), left lower leg, initial encounter: Secondary | ICD-10-CM | POA: Diagnosis present

## 2021-10-18 DIAGNOSIS — Z881 Allergy status to other antibiotic agents status: Secondary | ICD-10-CM

## 2021-10-18 DIAGNOSIS — E559 Vitamin D deficiency, unspecified: Secondary | ICD-10-CM | POA: Diagnosis present

## 2021-10-18 DIAGNOSIS — Z9049 Acquired absence of other specified parts of digestive tract: Secondary | ICD-10-CM

## 2021-10-18 DIAGNOSIS — N182 Chronic kidney disease, stage 2 (mild): Secondary | ICD-10-CM | POA: Diagnosis not present

## 2021-10-18 DIAGNOSIS — R531 Weakness: Secondary | ICD-10-CM | POA: Diagnosis not present

## 2021-10-18 DIAGNOSIS — E039 Hypothyroidism, unspecified: Secondary | ICD-10-CM | POA: Diagnosis present

## 2021-10-18 DIAGNOSIS — S7012XA Contusion of left thigh, initial encounter: Secondary | ICD-10-CM | POA: Diagnosis present

## 2021-10-18 DIAGNOSIS — K449 Diaphragmatic hernia without obstruction or gangrene: Secondary | ICD-10-CM | POA: Diagnosis not present

## 2021-10-18 DIAGNOSIS — E038 Other specified hypothyroidism: Secondary | ICD-10-CM | POA: Diagnosis not present

## 2021-10-18 DIAGNOSIS — I480 Paroxysmal atrial fibrillation: Secondary | ICD-10-CM | POA: Diagnosis present

## 2021-10-18 DIAGNOSIS — S0990XA Unspecified injury of head, initial encounter: Secondary | ICD-10-CM | POA: Diagnosis present

## 2021-10-18 DIAGNOSIS — F411 Generalized anxiety disorder: Secondary | ICD-10-CM | POA: Diagnosis present

## 2021-10-18 DIAGNOSIS — E860 Dehydration: Secondary | ICD-10-CM | POA: Diagnosis present

## 2021-10-18 DIAGNOSIS — I700 Atherosclerosis of aorta: Secondary | ICD-10-CM | POA: Diagnosis not present

## 2021-10-18 DIAGNOSIS — I9589 Other hypotension: Secondary | ICD-10-CM | POA: Diagnosis not present

## 2021-10-18 DIAGNOSIS — D72829 Elevated white blood cell count, unspecified: Secondary | ICD-10-CM | POA: Diagnosis not present

## 2021-10-18 DIAGNOSIS — Z87891 Personal history of nicotine dependence: Secondary | ICD-10-CM

## 2021-10-18 DIAGNOSIS — M25562 Pain in left knee: Secondary | ICD-10-CM | POA: Diagnosis present

## 2021-10-18 DIAGNOSIS — D68318 Other hemorrhagic disorder due to intrinsic circulating anticoagulants, antibodies, or inhibitors: Secondary | ICD-10-CM | POA: Diagnosis present

## 2021-10-18 DIAGNOSIS — K219 Gastro-esophageal reflux disease without esophagitis: Secondary | ICD-10-CM | POA: Diagnosis present

## 2021-10-18 DIAGNOSIS — F419 Anxiety disorder, unspecified: Secondary | ICD-10-CM | POA: Diagnosis present

## 2021-10-18 DIAGNOSIS — Z96653 Presence of artificial knee joint, bilateral: Secondary | ICD-10-CM | POA: Diagnosis present

## 2021-10-18 DIAGNOSIS — I2510 Atherosclerotic heart disease of native coronary artery without angina pectoris: Secondary | ICD-10-CM | POA: Diagnosis not present

## 2021-10-18 DIAGNOSIS — W010XXA Fall on same level from slipping, tripping and stumbling without subsequent striking against object, initial encounter: Secondary | ICD-10-CM | POA: Diagnosis present

## 2021-10-18 DIAGNOSIS — F339 Major depressive disorder, recurrent, unspecified: Secondary | ICD-10-CM | POA: Diagnosis present

## 2021-10-18 DIAGNOSIS — Z96652 Presence of left artificial knee joint: Secondary | ICD-10-CM | POA: Diagnosis not present

## 2021-10-18 DIAGNOSIS — Z6841 Body Mass Index (BMI) 40.0 and over, adult: Secondary | ICD-10-CM | POA: Diagnosis not present

## 2021-10-18 DIAGNOSIS — I361 Nonrheumatic tricuspid (valve) insufficiency: Secondary | ICD-10-CM | POA: Diagnosis present

## 2021-10-18 DIAGNOSIS — N179 Acute kidney failure, unspecified: Secondary | ICD-10-CM | POA: Diagnosis present

## 2021-10-18 DIAGNOSIS — Z7401 Bed confinement status: Secondary | ICD-10-CM | POA: Diagnosis not present

## 2021-10-18 DIAGNOSIS — N19 Unspecified kidney failure: Secondary | ICD-10-CM | POA: Diagnosis present

## 2021-10-18 DIAGNOSIS — Z7901 Long term (current) use of anticoagulants: Secondary | ICD-10-CM | POA: Diagnosis not present

## 2021-10-18 DIAGNOSIS — N189 Chronic kidney disease, unspecified: Secondary | ICD-10-CM | POA: Diagnosis not present

## 2021-10-18 DIAGNOSIS — Z96651 Presence of right artificial knee joint: Secondary | ICD-10-CM | POA: Diagnosis not present

## 2021-10-18 DIAGNOSIS — F330 Major depressive disorder, recurrent, mild: Secondary | ICD-10-CM | POA: Diagnosis not present

## 2021-10-18 DIAGNOSIS — Z20822 Contact with and (suspected) exposure to covid-19: Secondary | ICD-10-CM | POA: Diagnosis present

## 2021-10-18 DIAGNOSIS — Z8249 Family history of ischemic heart disease and other diseases of the circulatory system: Secondary | ICD-10-CM

## 2021-10-18 DIAGNOSIS — W19XXXA Unspecified fall, initial encounter: Principal | ICD-10-CM

## 2021-10-18 DIAGNOSIS — I130 Hypertensive heart and chronic kidney disease with heart failure and stage 1 through stage 4 chronic kidney disease, or unspecified chronic kidney disease: Secondary | ICD-10-CM | POA: Diagnosis not present

## 2021-10-18 DIAGNOSIS — I10 Essential (primary) hypertension: Secondary | ICD-10-CM | POA: Diagnosis not present

## 2021-10-18 DIAGNOSIS — Z043 Encounter for examination and observation following other accident: Secondary | ICD-10-CM | POA: Diagnosis not present

## 2021-10-18 DIAGNOSIS — Z79899 Other long term (current) drug therapy: Secondary | ICD-10-CM

## 2021-10-18 DIAGNOSIS — I5032 Chronic diastolic (congestive) heart failure: Secondary | ICD-10-CM | POA: Diagnosis present

## 2021-10-18 DIAGNOSIS — E861 Hypovolemia: Secondary | ICD-10-CM | POA: Diagnosis not present

## 2021-10-18 DIAGNOSIS — I872 Venous insufficiency (chronic) (peripheral): Secondary | ICD-10-CM | POA: Diagnosis present

## 2021-10-18 LAB — CBC WITH DIFFERENTIAL/PLATELET
Abs Immature Granulocytes: 0.04 10*3/uL (ref 0.00–0.07)
Basophils Absolute: 0.1 10*3/uL (ref 0.0–0.1)
Basophils Relative: 1 %
Eosinophils Absolute: 0.1 10*3/uL (ref 0.0–0.5)
Eosinophils Relative: 1 %
HCT: 33.6 % — ABNORMAL LOW (ref 36.0–46.0)
Hemoglobin: 10.5 g/dL — ABNORMAL LOW (ref 12.0–15.0)
Immature Granulocytes: 0 %
Lymphocytes Relative: 16 %
Lymphs Abs: 2.2 10*3/uL (ref 0.7–4.0)
MCH: 29.1 pg (ref 26.0–34.0)
MCHC: 31.3 g/dL (ref 30.0–36.0)
MCV: 93.1 fL (ref 80.0–100.0)
Monocytes Absolute: 0.7 10*3/uL (ref 0.1–1.0)
Monocytes Relative: 5 %
Neutro Abs: 10.1 10*3/uL — ABNORMAL HIGH (ref 1.7–7.7)
Neutrophils Relative %: 77 %
Platelets: 288 10*3/uL (ref 150–400)
RBC: 3.61 MIL/uL — ABNORMAL LOW (ref 3.87–5.11)
RDW: 14.3 % (ref 11.5–15.5)
WBC: 13.1 10*3/uL — ABNORMAL HIGH (ref 4.0–10.5)
nRBC: 0 % (ref 0.0–0.2)

## 2021-10-18 LAB — RESP PANEL BY RT-PCR (FLU A&B, COVID) ARPGX2
Influenza A by PCR: NEGATIVE
Influenza B by PCR: NEGATIVE
SARS Coronavirus 2 by RT PCR: NEGATIVE

## 2021-10-18 LAB — COMPREHENSIVE METABOLIC PANEL
ALT: 16 U/L (ref 0–44)
AST: 23 U/L (ref 15–41)
Albumin: 3.2 g/dL — ABNORMAL LOW (ref 3.5–5.0)
Alkaline Phosphatase: 75 U/L (ref 38–126)
Anion gap: 12 (ref 5–15)
BUN: 36 mg/dL — ABNORMAL HIGH (ref 8–23)
CO2: 26 mmol/L (ref 22–32)
Calcium: 8.7 mg/dL — ABNORMAL LOW (ref 8.9–10.3)
Chloride: 101 mmol/L (ref 98–111)
Creatinine, Ser: 1.16 mg/dL — ABNORMAL HIGH (ref 0.44–1.00)
GFR, Estimated: 50 mL/min — ABNORMAL LOW (ref >60)
Glucose, Bld: 147 mg/dL — ABNORMAL HIGH (ref 70–99)
Potassium: 4.8 mmol/L (ref 3.5–5.1)
Sodium: 139 mmol/L (ref 135–145)
Total Bilirubin: 0.6 mg/dL (ref 0.3–1.2)
Total Protein: 6.5 g/dL (ref 6.5–8.1)

## 2021-10-18 LAB — CBG MONITORING, ED: Glucose-Capillary: 158 mg/dL — ABNORMAL HIGH (ref 70–99)

## 2021-10-18 LAB — URINALYSIS, ROUTINE W REFLEX MICROSCOPIC
Bilirubin Urine: NEGATIVE
Glucose, UA: NEGATIVE mg/dL
Hgb urine dipstick: NEGATIVE
Ketones, ur: NEGATIVE mg/dL
Leukocytes,Ua: NEGATIVE
Nitrite: NEGATIVE
Protein, ur: NEGATIVE mg/dL
Specific Gravity, Urine: 1.019 (ref 1.005–1.030)
pH: 5 (ref 5.0–8.0)

## 2021-10-18 LAB — TROPONIN I (HIGH SENSITIVITY)
Troponin I (High Sensitivity): 5 ng/L (ref <18)
Troponin I (High Sensitivity): 5 ng/L (ref <18)

## 2021-10-18 LAB — MAGNESIUM: Magnesium: 2.1 mg/dL (ref 1.7–2.4)

## 2021-10-18 MED ORDER — ACETAMINOPHEN 325 MG RE SUPP: 650.0000 mg | Freq: Four times a day (QID) | RECTAL | Status: DC | PRN

## 2021-10-18 MED ORDER — SODIUM CHLORIDE 0.9 % IV BOLUS
1000.0000 mL | Freq: Once | INTRAVENOUS | Status: AC
  Administered 2021-10-19: 1000 mL via INTRAVENOUS

## 2021-10-18 MED ORDER — ONDANSETRON HCL 4 MG/2ML IJ SOLN
4.0000 mg | Freq: Once | INTRAMUSCULAR | Status: AC
  Administered 2021-10-18: 4 mg via INTRAVENOUS
  Filled 2021-10-18: qty 2

## 2021-10-18 MED ORDER — SODIUM CHLORIDE 0.9 % IV BOLUS
250.0000 mL | Freq: Once | INTRAVENOUS | Status: AC
Start: 2021-10-18 — End: 2021-10-18
  Administered 2021-10-18: 250 mL via INTRAVENOUS

## 2021-10-18 MED ORDER — SODIUM CHLORIDE 0.9 % IV SOLN: Freq: Once | INTRAVENOUS | Status: AC

## 2021-10-18 MED ORDER — SODIUM CHLORIDE 0.9 % IV BOLUS
1000.0000 mL | Freq: Once | INTRAVENOUS | Status: AC
  Administered 2021-10-18: 1000 mL via INTRAVENOUS

## 2021-10-18 MED ORDER — ACETAMINOPHEN 325 MG PO TABS: 650.0000 mg | ORAL_TABLET | Freq: Four times a day (QID) | ORAL | Status: DC | PRN

## 2021-10-18 NOTE — ED Notes (Signed)
Still no urine output from purewick.

## 2021-10-18 NOTE — ED Provider Triage Note (Signed)
Emergency Medicine Provider Triage Evaluation Note  [name] , a 71 y.o. female  was evaluated in triage.  Pt complains of weakness, fall yesterday, no head injury, left knee pain and hip pain..  Review of Systems  Positive: Fall, weakness, left hip or left knee. Negative: Shortness of breath or chest pain  Physical Exam  There were no vitals taken for this visit. Gen:   Awake, no distress   Resp:  Normal effort  MSK:   Moves extremities without difficulty, patient is unable to stand, Other:    Medical Decision Making  Medically screening exam initiated at 11:20 AM.  Appropriate orders placed.  Georgiana Spinner was informed that the remainder of the evaluation will be completed by another provider, this initial triage assessment does not replace that evaluation, and the importance of remaining in the ED until their evaluation is complete.  Weakness and fall work-up started   Versie Starks, PA-C 10/18/21 1122

## 2021-10-18 NOTE — ED Provider Notes (Signed)
[facility] Provider Note    Event Date/Time   First MD Initiated Contact with Patient 10/18/21 1144     (approximate)  History   Chief Complaint: Fall  HPI  UNDINE NEALIS is a 71 y.o. female with a past medical history of CHF, obesity, presents to the emergency department for left knee pain after a fall yesterday.  According to the son she normally ambulates with a cane, she had a fall last night landed on her left knee and has pain to the knee.  Patient was able to walk somewhat yesterday after the fall but states this morning she was unable to walk due to the pain.  Patient states this morning she began feeling weak and lightheaded as well.  Upon arrival to the emergency department patient noted to be pale in appearance diaphoretic with a blood pressure of 71/48 currently.  Patient is morbidly obese which does limit our accuracy of the blood pressure however does appear somewhat pale and diaphoretic.  Physical Exam   Triage Vital Signs: ED Triage Vitals [10/18/21 1132]  Enc Vitals Group     BP (!) 71/48     Pulse Rate 62     Resp 14     Temp 98 F (36.7 C)     Temp Source Oral     SpO2 96 %     Weight      Height      Head Circumference      Peak Flow      Pain Score 10     Pain Loc      Pain Edu?      Excl. in GC?     Most recent vital signs: Vitals:   10/18/21 1132  BP: (!) 71/48  Pulse: 62  Resp: 14  Temp: 98 F (36.7 C)  SpO2: 96%    General: Awake, no distress.  CV:  Good peripheral perfusion.  Regular rate and rhythm  Resp:  Normal effort.  Equal breath sounds bilaterally.  Abd:  No distention.  Soft, nontender.  No rebound or guarding.    ED Results / Procedures / Treatments   EKG  EKG viewed and interpreted by myself shows what appears to be a sinus rhythm at 62 bpm, slightly widened QRS, left axis deviation.  Nonspecific ST changes.  RADIOLOGY  I personally reviewed the CT images of the head, no acute  abnormality on my evaluation. CT scan head and C-spine are negative for acute abnormality.  MEDICATIONS ORDERED IN ED: Medications  sodium chloride 0.9 % bolus 250 mL (has no administration in time range)     IMPRESSION / MDM / ASSESSMENT AND PLAN / ED COURSE  I reviewed the triage vital signs and the nursing notes.  Patient presents to the emergency department for weakness and left knee pain.  Patient had a fall yesterday onto her left knee.  Patient does have bruising to left knee, status post total knee replacement years ago.  We will obtain x-ray images to further evaluate.  Patient noted to be hypotensive diaphoretic and somewhat pale appearing.  Patient is on Eliquis, states she did hit her head but not hard.  We will obtain CT imaging the head as a precaution.  We will obtain x-ray imaging of the knee.  We will check labs, IV hydrate and treat with Zofran.  We will obtain a COVID swab, EKG and cardiac enzymes.  EKG does not appear to show any obvious ischemic changes.  Troponin is pending.  CBC shows a normal H&H with a slightly elevated white blood cell count.  CBG is normal.  Chemistry and troponin pending.  Patient's COVID test is negative.  CT does not appear to show any acute abnormality, official radiology read pending.  Urinalysis, chemistry, troponin pending.  Troponin is negative.  COVID and flu negative.  Patient's chemistry is still pending.  X-rays are pending.  Patient care signed out to oncoming provider.  No obvious sign of infection  FINAL CLINICAL IMPRESSION(S) / ED DIAGNOSES   Weakness Left knee pain Hypotension   Note:  This document was prepared using Dragon voice recognition software and may include unintentional dictation errors.   Minna Antis, MD 10/18/21 1547

## 2021-10-18 NOTE — ED Triage Notes (Addendum)
Pt comes with c/o fall last night. Pt was bending down to get something and fell. Pt denies any LOC. Pt states pain to left knee with swelling and bruising. Pt states weakness and dizziness for few days.  HR-56 O2-100  Pt is on blood thinners. Pt states she did hit her head a little.

## 2021-10-18 NOTE — ED Notes (Signed)
This RN called CT & let them know pt is okay now to go to CT & xray.

## 2021-10-18 NOTE — ED Notes (Signed)
Pt transported to xray via bed with xray tech.

## 2021-10-18 NOTE — ED Notes (Signed)
Per Dr. Lenard Lance, get BP up before pt goes to CT scan. CT tech aware.

## 2021-10-18 NOTE — ED Notes (Signed)
No urine output from purewick. °

## 2021-10-18 NOTE — ED Notes (Signed)
COVID swab sent to lab.

## 2021-10-18 NOTE — ED Notes (Signed)
Dr. Goodman at bedside.  

## 2021-10-18 NOTE — ED Notes (Signed)
This RN called & spoke with xray to let them know pt is ready for xray.

## 2021-10-18 NOTE — ED Notes (Signed)
Pt not in room.

## 2021-10-18 NOTE — ED Notes (Signed)
Secure msg sent to Dr. Derrill Kay re: pt & son's request to speak with him.

## 2021-10-18 NOTE — ED Notes (Signed)
Pt BIB EMS for L knee/lower leg pain after a fall last night. On arrival to ER, pt found to be hypotensive. Pt assisted from [facility] to hospital bed by this RN, 2 additional RN's, MD, CNA. Pt placed in trendelenburg position. Son at bedside.

## 2021-10-18 NOTE — ED Notes (Signed)
No urine output from purewick.

## 2021-10-18 NOTE — ED Notes (Signed)
Repeat troponin sent to lab (light green top tube).

## 2021-10-18 NOTE — ED Notes (Signed)
Warm blankets given to pt for comfort. Pt & son updated on POC (BP, CT, xray).

## 2021-10-18 NOTE — ED Notes (Signed)
In & out catheter for urine specimen obtained; bladder emptied of approx urine, clear, yellow in color. Urine specimen sent to lab.

## 2021-10-19 ENCOUNTER — Encounter: Payer: Self-pay | Admitting: Internal Medicine

## 2021-10-19 ENCOUNTER — Inpatient Hospital Stay: Payer: PPO

## 2021-10-19 ENCOUNTER — Observation Stay: Payer: PPO

## 2021-10-19 DIAGNOSIS — N19 Unspecified kidney failure: Secondary | ICD-10-CM | POA: Diagnosis present

## 2021-10-19 DIAGNOSIS — I5032 Chronic diastolic (congestive) heart failure: Secondary | ICD-10-CM | POA: Diagnosis not present

## 2021-10-19 DIAGNOSIS — D649 Anemia, unspecified: Secondary | ICD-10-CM | POA: Diagnosis not present

## 2021-10-19 DIAGNOSIS — D68318 Other hemorrhagic disorder due to intrinsic circulating anticoagulants, antibodies, or inhibitors: Secondary | ICD-10-CM | POA: Diagnosis present

## 2021-10-19 DIAGNOSIS — M25562 Pain in left knee: Secondary | ICD-10-CM | POA: Diagnosis present

## 2021-10-19 DIAGNOSIS — S80822A Blister (nonthermal), left lower leg, initial encounter: Secondary | ICD-10-CM | POA: Diagnosis present

## 2021-10-19 DIAGNOSIS — Z20822 Contact with and (suspected) exposure to covid-19: Secondary | ICD-10-CM | POA: Diagnosis present

## 2021-10-19 DIAGNOSIS — Z8616 Personal history of COVID-19: Secondary | ICD-10-CM | POA: Diagnosis not present

## 2021-10-19 DIAGNOSIS — F411 Generalized anxiety disorder: Secondary | ICD-10-CM | POA: Diagnosis present

## 2021-10-19 DIAGNOSIS — Z6841 Body Mass Index (BMI) 40.0 and over, adult: Secondary | ICD-10-CM | POA: Diagnosis not present

## 2021-10-19 DIAGNOSIS — I110 Hypertensive heart disease with heart failure: Secondary | ICD-10-CM | POA: Diagnosis present

## 2021-10-19 DIAGNOSIS — I959 Hypotension, unspecified: Secondary | ICD-10-CM | POA: Diagnosis present

## 2021-10-19 DIAGNOSIS — N179 Acute kidney failure, unspecified: Secondary | ICD-10-CM | POA: Diagnosis present

## 2021-10-19 DIAGNOSIS — D72829 Elevated white blood cell count, unspecified: Secondary | ICD-10-CM | POA: Diagnosis present

## 2021-10-19 DIAGNOSIS — I700 Atherosclerosis of aorta: Secondary | ICD-10-CM | POA: Diagnosis not present

## 2021-10-19 DIAGNOSIS — I9589 Other hypotension: Secondary | ICD-10-CM | POA: Diagnosis not present

## 2021-10-19 DIAGNOSIS — W010XXA Fall on same level from slipping, tripping and stumbling without subsequent striking against object, initial encounter: Secondary | ICD-10-CM | POA: Diagnosis present

## 2021-10-19 DIAGNOSIS — I10 Essential (primary) hypertension: Secondary | ICD-10-CM

## 2021-10-19 DIAGNOSIS — E039 Hypothyroidism, unspecified: Secondary | ICD-10-CM

## 2021-10-19 DIAGNOSIS — M1990 Unspecified osteoarthritis, unspecified site: Secondary | ICD-10-CM | POA: Diagnosis present

## 2021-10-19 DIAGNOSIS — K5730 Diverticulosis of large intestine without perforation or abscess without bleeding: Secondary | ICD-10-CM | POA: Diagnosis not present

## 2021-10-19 DIAGNOSIS — D62 Acute posthemorrhagic anemia: Secondary | ICD-10-CM | POA: Diagnosis present

## 2021-10-19 DIAGNOSIS — K449 Diaphragmatic hernia without obstruction or gangrene: Secondary | ICD-10-CM | POA: Diagnosis not present

## 2021-10-19 DIAGNOSIS — R21 Rash and other nonspecific skin eruption: Secondary | ICD-10-CM | POA: Diagnosis present

## 2021-10-19 DIAGNOSIS — S7012XA Contusion of left thigh, initial encounter: Secondary | ICD-10-CM | POA: Diagnosis present

## 2021-10-19 DIAGNOSIS — K219 Gastro-esophageal reflux disease without esophagitis: Secondary | ICD-10-CM | POA: Diagnosis present

## 2021-10-19 DIAGNOSIS — I480 Paroxysmal atrial fibrillation: Secondary | ICD-10-CM | POA: Diagnosis present

## 2021-10-19 DIAGNOSIS — I361 Nonrheumatic tricuspid (valve) insufficiency: Secondary | ICD-10-CM | POA: Diagnosis present

## 2021-10-19 DIAGNOSIS — F339 Major depressive disorder, recurrent, unspecified: Secondary | ICD-10-CM | POA: Diagnosis present

## 2021-10-19 DIAGNOSIS — E860 Dehydration: Secondary | ICD-10-CM | POA: Diagnosis present

## 2021-10-19 DIAGNOSIS — S0990XA Unspecified injury of head, initial encounter: Secondary | ICD-10-CM | POA: Diagnosis present

## 2021-10-19 DIAGNOSIS — E559 Vitamin D deficiency, unspecified: Secondary | ICD-10-CM | POA: Diagnosis present

## 2021-10-19 DIAGNOSIS — W19XXXA Unspecified fall, initial encounter: Secondary | ICD-10-CM | POA: Diagnosis not present

## 2021-10-19 DIAGNOSIS — I130 Hypertensive heart and chronic kidney disease with heart failure and stage 1 through stage 4 chronic kidney disease, or unspecified chronic kidney disease: Secondary | ICD-10-CM | POA: Diagnosis not present

## 2021-10-19 DIAGNOSIS — N182 Chronic kidney disease, stage 2 (mild): Secondary | ICD-10-CM | POA: Diagnosis not present

## 2021-10-19 DIAGNOSIS — F419 Anxiety disorder, unspecified: Secondary | ICD-10-CM | POA: Diagnosis present

## 2021-10-19 LAB — IRON AND TIBC
Iron: 49 ug/dL (ref 28–170)
Saturation Ratios: 18 % (ref 10.4–31.8)
TIBC: 273 ug/dL (ref 250–450)
UIBC: 224 ug/dL

## 2021-10-19 LAB — CBC WITH DIFFERENTIAL/PLATELET
Abs Immature Granulocytes: 0.05 10*3/uL (ref 0.00–0.07)
Basophils Absolute: 0.1 10*3/uL (ref 0.0–0.1)
Basophils Relative: 1 %
Eosinophils Absolute: 0.1 10*3/uL (ref 0.0–0.5)
Eosinophils Relative: 1 %
HCT: 26.1 % — ABNORMAL LOW (ref 36.0–46.0)
Hemoglobin: 8 g/dL — ABNORMAL LOW (ref 12.0–15.0)
Immature Granulocytes: 1 %
Lymphocytes Relative: 17 %
Lymphs Abs: 1.8 10*3/uL (ref 0.7–4.0)
MCH: 29 pg (ref 26.0–34.0)
MCHC: 30.7 g/dL (ref 30.0–36.0)
MCV: 94.6 fL (ref 80.0–100.0)
Monocytes Absolute: 1 10*3/uL (ref 0.1–1.0)
Monocytes Relative: 10 %
Neutro Abs: 7.6 10*3/uL (ref 1.7–7.7)
Neutrophils Relative %: 70 %
Platelets: 197 10*3/uL (ref 150–400)
RBC: 2.76 MIL/uL — ABNORMAL LOW (ref 3.87–5.11)
RDW: 14.6 % (ref 11.5–15.5)
WBC: 10.6 10*3/uL — ABNORMAL HIGH (ref 4.0–10.5)
nRBC: 0 % (ref 0.0–0.2)

## 2021-10-19 LAB — COMPREHENSIVE METABOLIC PANEL
ALT: 14 U/L (ref 0–44)
AST: 18 U/L (ref 15–41)
Albumin: 2.6 g/dL — ABNORMAL LOW (ref 3.5–5.0)
Alkaline Phosphatase: 59 U/L (ref 38–126)
Anion gap: 3 — ABNORMAL LOW (ref 5–15)
BUN: 42 mg/dL — ABNORMAL HIGH (ref 8–23)
CO2: 28 mmol/L (ref 22–32)
Calcium: 7.7 mg/dL — ABNORMAL LOW (ref 8.9–10.3)
Chloride: 111 mmol/L (ref 98–111)
Creatinine, Ser: 1.53 mg/dL — ABNORMAL HIGH (ref 0.44–1.00)
GFR, Estimated: 36 mL/min — ABNORMAL LOW (ref >60)
Glucose, Bld: 110 mg/dL — ABNORMAL HIGH (ref 70–99)
Potassium: 4.2 mmol/L (ref 3.5–5.1)
Sodium: 142 mmol/L (ref 135–145)
Total Bilirubin: 0.5 mg/dL (ref 0.3–1.2)
Total Protein: 5.5 g/dL — ABNORMAL LOW (ref 6.5–8.1)

## 2021-10-19 LAB — TROPONIN I (HIGH SENSITIVITY): Troponin I (High Sensitivity): 7 ng/L (ref <18)

## 2021-10-19 LAB — MAGNESIUM: Magnesium: 2 mg/dL (ref 1.7–2.4)

## 2021-10-19 LAB — FERRITIN: Ferritin: 141 ng/mL (ref 11–307)

## 2021-10-19 LAB — LACTATE DEHYDROGENASE: LDH: 109 U/L (ref 98–192)

## 2021-10-19 LAB — RETICULOCYTES
Immature Retic Fract: 13.8 % (ref 2.3–15.9)
RBC.: 3.58 MIL/uL — ABNORMAL LOW (ref 3.87–5.11)
Retic Count, Absolute: 66.9 10*3/uL (ref 19.0–186.0)
Retic Ct Pct: 1.9 % (ref 0.4–3.1)

## 2021-10-19 LAB — TSH: TSH: 0.958 u[IU]/mL (ref 0.350–4.500)

## 2021-10-19 LAB — PROCALCITONIN: Procalcitonin: <0.1 ng/mL

## 2021-10-19 LAB — PROTIME-INR
INR: 1.6 — ABNORMAL HIGH (ref 0.8–1.2)
Prothrombin Time: 19.2 seconds — ABNORMAL HIGH (ref 11.4–15.2)

## 2021-10-19 LAB — FOLATE: Folate: 18.4 ng/mL (ref >5.9)

## 2021-10-19 LAB — LACTIC ACID, PLASMA: Lactic Acid, Venous: 1.7 mmol/L (ref 0.5–1.9)

## 2021-10-19 MED ORDER — FLECAINIDE ACETATE 100 MG PO TABS
100.0000 mg | ORAL_TABLET | Freq: Two times a day (BID) | ORAL | Status: DC
  Administered 2021-10-19 – 2021-10-25 (×14): 100 mg via ORAL
  Filled 2021-10-19 (×16): qty 1

## 2021-10-19 MED ORDER — ESCITALOPRAM OXALATE 10 MG PO TABS
5.0000 mg | ORAL_TABLET | Freq: Every day | ORAL | Status: DC
  Administered 2021-10-19 – 2021-10-25 (×7): 5 mg via ORAL
  Filled 2021-10-19 (×2): qty 0.5
  Filled 2021-10-19: qty 1
  Filled 2021-10-19 (×4): qty 0.5

## 2021-10-19 MED ORDER — IOHEXOL 350 MG/ML SOLN
80.0000 mL | Freq: Once | INTRAVENOUS | Status: AC | PRN
  Administered 2021-10-19: 80 mL via INTRAVENOUS

## 2021-10-19 MED ORDER — OXYCODONE HCL 5 MG PO TABS
2.5000 mg | ORAL_TABLET | Freq: Four times a day (QID) | ORAL | Status: DC | PRN
  Administered 2021-10-19 – 2021-10-24 (×7): 2.5 mg via ORAL
  Filled 2021-10-19 (×7): qty 1

## 2021-10-19 MED ORDER — ACETAMINOPHEN 500 MG PO TABS
1000.0000 mg | ORAL_TABLET | Freq: Three times a day (TID) | ORAL | Status: DC
Start: 2021-10-19 — End: 2021-10-25
  Administered 2021-10-19 – 2021-10-25 (×19): 1000 mg via ORAL
  Filled 2021-10-19 (×20): qty 2

## 2021-10-19 MED ORDER — IOHEXOL 9 MG/ML PO SOLN: 500.0000 mL | Freq: Once | ORAL | Status: DC | PRN

## 2021-10-19 MED ORDER — SODIUM CHLORIDE 0.9 % IV SOLN: INTRAVENOUS | Status: DC

## 2021-10-19 MED ORDER — APIXABAN 5 MG PO TABS
5.0000 mg | ORAL_TABLET | Freq: Two times a day (BID) | ORAL | Status: DC
Start: 2021-10-19 — End: 2021-10-19
  Administered 2021-10-19: 5 mg via ORAL
  Filled 2021-10-19: qty 1

## 2021-10-19 MED ORDER — SODIUM CHLORIDE 0.9 % IV SOLN: Freq: Once | INTRAVENOUS | Status: AC

## 2021-10-19 MED ORDER — PANTOPRAZOLE SODIUM 40 MG IV SOLR: 40.0000 mg | Freq: Two times a day (BID) | INTRAVENOUS | Status: DC

## 2021-10-19 MED ORDER — SODIUM CHLORIDE 0.9 % IV BOLUS
500.0000 mL | Freq: Once | INTRAVENOUS | Status: AC
  Administered 2021-10-19: 500 mL via INTRAVENOUS

## 2021-10-19 MED ORDER — LEVOTHYROXINE SODIUM 50 MCG PO TABS
75.0000 ug | ORAL_TABLET | Freq: Every day | ORAL | Status: DC
  Administered 2021-10-19 – 2021-10-25 (×7): 75 ug via ORAL
  Filled 2021-10-19 (×3): qty 1
  Filled 2021-10-19: qty 2
  Filled 2021-10-19 (×3): qty 1

## 2021-10-19 NOTE — Assessment & Plan Note (Signed)
Continue Lexapro

## 2021-10-19 NOTE — Progress Notes (Addendum)
Progress Note   Patient: [name] TOI:[id number] DOB: 1950-09-22 DOA: 10/18/2021     0 DOS: the patient was seen and examined on 10/19/2021       Brief hospital course: Mrs. Seel is a 71 y.o. F with dCHF, recent COVID, pAF on Eliquis, HTN, hypothyroidism, hx of splenic infarction and hx venous insufficiency who presented with mechanical fall.  Afterwards developed dizziness/lightheadedness, EMS found with BP 70s/40s.  In the ER, noted to have marked hypotension.  WBC 13K but UA clear, CXR clear.  Did not appear septic.    Started on IV fluids and admitted for dehydration and knee contusion (x-ray negative for fracture).   2/28: [place] leg showed large hematoma       Assessment and Plan: * Hypotension- (present on admission) BP at previous visits consistently 130s-180s, even a few weeks ago.  At home, patient reports BP usually 120/60.   Here, 80s-90s systolic.  This started acutely after her fall on admission. There are no infectious signs, and our suspicion for sepsis is low.  In contast, her Hgb is acutely down to 8 g/dL today from a basleine ~83.  - Check CT abd/pel to rule out hematoma - If negative, may get CT thigh to rule out thigh hematoma  - Continue IV fluids Re: infection, I doubt it at this time. - Check blood cultures and lactate  ADDENDUM: [place] thigh shows 8x7x3cm hematoma.  -Hold ELiquis and Trend Hgb  Acute anemia- (present on admission) Iron stores normal.  Guiac negative, not clinically melena, denies melena this week.    Tbili normal, doubt hemolysis, but will rule out.  Given her trauma/fall and Eliquis, I suspect more likely this is hematoma.  If hematoma ruled out, may be artifactual  - Check CT abd/pel - Hold ELiquis until hematoma ruled out - Follow Hapto/LDH    Rash- (present on admission) Blisters on the left leg, I suspect are related to chronic venous insufficiency, not Zoster or cellulitis. - Monitor  Left knee pain- (present  on admission) X-ray negative.  I suspect this is a contusion, as it started after her fall.  If the CT abdomen is negative, I will image the thigh. - PT eval if hematoma ruled out  Class 3 obesity (HCC)- (present on admission) BMI >60  Paroxysmal atrial fibrillation (HCC)- (present on admission) -Continue flecainide -Hold Eliquis -Hold diltiazem, metoprolol - Monitor on tele  Recurrent major depression (HCC)- (present on admission) -Continue Lexapro  Chronic diastolic heart failure (HCC)- (present on admission) BP soft -Hold Lasix  Hypothyroidism- (present on admission) TSH normal -Continue levothyroxine  Essential hypertension- (present on admission) Hypotensive -Hold furosemide, metoprolo, diltiazem, losartan           Subjective: No more dizziness, no confusion, no fever.  No cough or respiratory symptoms, no dysuria or urinary irritative symptoms at all.  No abdominal pain, no nausea, no pain except in the left knee, with movement.        Physical Exam: Vitals:   10/19/21 1155 10/19/21 1215 10/19/21 1440 10/19/21 1534  BP: 105/62 (!) 125/54 106/69 (!) 103/44  Pulse: 84 (!) 101 79 85  Resp: (!) 24 (!) 22 20 16   Temp: 98.5 F (36.9 C)  98.2 F (36.8 C) 98 F (36.7 C)  TempSrc:   Oral   SpO2: 95% 100% 95% 99%   Obese adult female, lying in bed, appropriate and interactive.   RRR, soft systolic murmur, chronic venous insufficiency, JVP not visible. Respiratory rate easy  and unlabored, no rales or wheezing appreciated. Abdomen soft without tenderness oto palpation or localizing or rebound or guarding or rigidity. Bilateral legs with chronic wine bottle venous insufficiency deformity.  On the left leg there are some blisters, bullae as pictured below, she is numb in this area.  THe left knee is painful to touch.  I actually think there is some bruising over the knee, consistent with a hematoma. CNs 3-12 intact, face symmetric, speech fluent, moves upper  extremities with normal strength and coordination. Attention normal, affect normal, appears fcomfortable.      Data Reviewed: Labs and imaging notable for negative CXR, negative lfet knee x-ray Hgb down to 8, WBC >12.  Platelets normal. Metabolic panel notable for increased BUN to creatinine ratio, Cr normal, electrolytes normal, TSH normal Procal normal LFTs normal     Family Communication:   Disposition: Status is: Observation The patient will require care spanning > 2 midnights and should be moved to inpatient because: She has persistent hypotnsion, and will require ongonig workup for acutely rapidly worsening anemia.         Planned Discharge Destination: Skilled nursing facility      Author: Alberteen Sam, MD 10/19/2021 8:23 PM  For on call review [email or web address].

## 2021-10-19 NOTE — Assessment & Plan Note (Signed)
X-ray negative.  I suspect this is a contusion, as it started after her fall.  If the CT abdomen is negative, I will image the thigh. - PT eval if hematoma ruled out

## 2021-10-19 NOTE — Assessment & Plan Note (Signed)
Hypotensive -Hold furosemide, metoprolo, diltiazem, losartan

## 2021-10-19 NOTE — Assessment & Plan Note (Signed)
-  Continue flecainide -Hold Eliquis -Hold diltiazem, metoprolol - Monitor on tele

## 2021-10-19 NOTE — Assessment & Plan Note (Signed)
BP soft -Hold Lasix

## 2021-10-19 NOTE — Progress Notes (Signed)
Patient arrived to unit via bed in stable condition.

## 2021-10-19 NOTE — Progress Notes (Signed)
PT Cancellation Note  Patient Details Name: [name] MRN: [id number] DOB: 07-26-1951   Cancelled Treatment:    Reason Eval/Treat Not Completed: Other (comment). Per RN in ED, pt transferring up to unit to room 115. Will continue to follow and evaluate when available.    Delphia Grates. Fairly IV, PT, DPT Physical Therapist- [place]  [facility]  10/19/2021, 3:07 PM

## 2021-10-19 NOTE — Assessment & Plan Note (Addendum)
BP at previous visits consistently 130s-180s, even a few weeks ago.  At home, patient reports BP usually 120/60.   Here, 80s-90s systolic.  This started acutely after her fall on admission. There are no infectious signs, and our suspicion for sepsis is low.  In contast, her Hgb is acutely down to 8 g/dL today from a basleine ~40.  - Check CT abd/pel to rule out hematoma - If negative, may get CT thigh to rule out thigh hematoma  - Continue IV fluids Re: infection, I doubt it at this time. - Check blood cultures and lactate  ADDENDUM: [place] thigh shows 8x7x3cm hematoma.  -Hold ELiquis and Trend Hgb

## 2021-10-19 NOTE — H&P (Addendum)
History and Physical    PLEASE NOTE THAT DRAGON DICTATION SOFTWARE WAS USED IN THE CONSTRUCTION OF THIS NOTE.   [name] [id number] DOB: 1951-06-05 DOA: 10/18/2021  PCP: Deland Pretty, MD  Patient coming from: home   I have personally briefly reviewed patient's old medical records in [place]  Chief Complaint: Left knee pain  HPI: [name] is a 71 y.o. female with medical history significant for paroxysmal atrial fibrillation chronically anticoagulated on Eliquis, generalized anxiety disorder, chronic diastolic heart failure, essential hypertension, acquired hypothyroidism, who is admitted to [facility] on 10/18/2021 with hypotension after presenting from home to [facility] ED complaining of left knee pain.   The patient reports that she experienced a ground-level fall on 10/18/2021 as a result of her tripping while attempting to ambulate at home.  This resulted in a fall to the floor in which she struck the anterior aspect of the left knee as the principal point of contact with the floor below.  As a secondary impact, she believes that she hit her head in the absence of any associated loss of consciousness.  Over the next day, she reports persistent left knee discomfort, which has prompted her to present to [facility] ED today for further evaluation and management thereof.  In the setting of a history of atrial fibrillation, she is chronically anticoagulated on Eliquis.  Denies any antiplatelet use.  She reports that the above fall was not associated with any preceding or ensuing chest pain, shortness of breath, palpitations, diaphoresis, presyncope, or syncope.  Denies any associated or ensuing headache, neck pain, neck stiffness. Denies any acute focal weakness, acute focal numbness, paresthesias, facial droop, slurred speech, expressive aphasia, acute change in vision, dysphagia, vertigo.  She has noted some mild nausea over the last day resulting in a decline in oral intake. she  also reports some dizziness, without vertigo, which has resolved following initiation of IV fluids in the emergency department this evening, as further detailed below.  Aside from the left knee pain, denies any significant acute arthralgias as a consequence of the above fall.  She also denies any acute abdominal discomfort, diarrhea, melena, or hematochezia.  Denies any recent dysuria or gross hematuria.  No recent subjective fever, chills, rigors, or generalized myalgias.  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  She has a documented history of essential hypertension, in which her home dose of losartan was recently increased over the course the last month from 50 mg p.o. daily to 100 mg p.o. daily she is also on Lopressor and Lasix.  In the setting of her history of paroxysmal atrial fibrillation, she follows with [facility]. V[facility] cardiology, with most recent outpatient appointment occurring on 09/21/2021.  Per review of documentation from this most recent outpatient cardiology appointment, the patient had been reporting average systolic blood pressures in the 130s leading up to that appointment, while blood pressure documented during this previous cardiology appointment was noted to be 186/97.   Medical history also notable for documentation of chronic diastolic heart failure, with most recent echocardiogram occurring in April 2019, which was notable for LVEF 65 to 70%, left ventricular cavity size normal, normal biventricular wall thickness, no focal wall motion normalities, normal diastolic parameters, normal right ventricular systolic function, and trivial tricuspid regurgitation.  Per chart review, baseline creatinine range appears to be 0.8-1.0, with most recent prior serum creatinine noted to be 0.94 on 09/10/2021.  Additionally, per chart review, her baseline  hemoglobin range appears to be 12.5-14.   ED Course:  Vital signs in the ED were notable for the  following: Afebrile; heart rate 49-73; with most recent heart rates in the 60s; initial blood pressure 71/48, which has increased to 107/51 following interval IV fluids, as further detailed below; respiratory rate 14-19, and oxygen saturation 96 to 100% on room air.  Labs were notable for the following: CMP notable for the following: Sodium 139, potassium 4.8, BUN 30 compared to 28 on 09/10/2021, creatinine 1.16, BUN to creatinine ratio 31, glucose 147, liver enzymes within normal limits.  High-sensitivity troponin I x2 values were both found to be 5.  CBC notable for the following: Lipid cell count 13,100, hemoglobin 10.5, with most recent hemoglobin values notable for 12.8 on 09/10/2021 as well as 12.8 on 09/09/2021, with today's hemoglobin noted to be associated with normocytic/normochromic findings as well as nonelevated RDW.  Urinalysis showed no white blood cells, no bacteria, no hemoglobin, no RBCs, leukocyte Estrace/nitrate negative, and was associated with specific every 1.019.  COVID-19/influenza PCR negative.  Imaging and additional notable ED work-up: EKG showed sinus rhythm with heart rate 55, nonspecific intraventricular conduction delay, and no evidence of T wave or ST changes, including no evidence of ST elevation.  Noncontrast CT that showed no evidence of acute intracranial process, including no evidence of intracranial hemorrhage while CT cervical spine showed no evidence of acute fracture or subluxation to the cervical spine.  Plain films of the left hip and pelvis showed no evidence of acute bony abnormality.  Plain films of the left knee, 4 views, showed intact total knee arthroplasty components and no evidence of acute bony abnormality.  While in the ED, the following were administered: Zofran 4 mg IV x1, normal saline x1100 250 cc bolus followed by initiation continuous NS running at 150 cc/h.  Subsequently, the patient was admitted for further evaluation and management presenting  hypotension in the setting of presenting acute left knee pain after mechanical ground-level fall on the day prior, with presentation also notable for findings of acute anemia, acute prerenal azotemia, and leukocytosis.    Review of Systems: As per HPI otherwise 10 point review of systems negative.   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   OA (osteoarthritis) of knee 04/04/2016   Paroxysmal atrial fibrillation ([place]) 09/08/2021   Tobacco user 09/08/2021   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"   Vitamin D deficiency 09/08/2021    Past Surgical History:  Procedure Laterality Date   CARDIOVERSION N/A 02/16/2021   Procedure: CARDIOVERSION;  Surgeon: Deboraha Sprang, MD;  Location: [facility] ORS;  Service: Cardiovascular;  Laterality: N/A;   CHOLECYSTECTOMY     DILATION AND CURETTAGE OF UTERUS     LOOP RECORDER INSERTION N/A 12/21/2017   Procedure: LOOP RECORDER INSERTION;  Surgeon: Deboraha Sprang, MD;  Location: [place] [facility];  Service: Cardiovascular;  Laterality: N/A;   OMENTECTOMY N/A 10/12/2015   Procedure: PARTIAL OMENTECTOMY;  Surgeon: Georganna Skeans, MD;  Location: [place];  Service: General;  Laterality: N/A;   TOTAL KNEE ARTHROPLASTY Left 04/04/2016  Procedure: LEFT TOTAL KNEE ARTHROPLASTY;  Surgeon: Gaynelle Arabian, MD;  Location: [facility];  Service: Orthopedics;  Laterality: Left;   TOTAL KNEE ARTHROPLASTY Right 08/01/2016   Procedure: TOTAL KNEE ARTHROPLASTY;  Surgeon: Gaynelle Arabian, MD;  Location: [facility];  Service: Orthopedics;  Laterality: Right;   UMBILICAL HERNIA REPAIR N/A 10/12/2015   Procedure:  HERNIA REPAIR UMBILICAL ADULT/INCARERATED;  Surgeon: Georganna Skeans, MD;  Location: [place];  Service: General;  Laterality: N/A;    Social History:  reports that she quit smoking about 34 years ago. Her smoking use included cigarettes. She has never used smokeless tobacco. She reports that she does not drink alcohol and does not use drugs.   Allergies  Allergen Reactions   Augmentin [Amoxicillin-Pot Clavulanate] Hives and Itching    Has patient had a PCN reaction causing immediate rash, facial/tongue/throat swelling, SOB or lightheadedness with hypotension:No Has patient had a PCN reaction causing severe rash involving mucus membranes or skin necrosis:No Has patient had a PCN reaction that required hospitalization:No Has patient had a PCN reaction occurring within the last 10 years:No If all of the above answers are "NO", then may proceed with Cephalosporin use.    Pneumococcal Vaccines Other (See Comments)    Caused fever, and swelling at injection site    Family History  Problem Relation Age of Onset   Rheum arthritis Mother    Heart attack Father    Heart disease Father     Family history reviewed and not pertinent    Prior to Admission medications   Medication Sig Start Date End Date Taking? Authorizing Provider  apixaban (ELIQUIS) 5 MG TABS tablet TAKE 1 TABLET BY MOUTH 2 TIMES DAILY. 09/13/21  Yes Deboraha Sprang, MD  ascorbic acid (VITAMIN C) 500 MG tablet Take 500 mg by mouth daily.   Yes [provider]  escitalopram (LEXAPRO) 5 MG tablet Take 5 mg by mouth daily. 12/28/20  Yes [provider]  flecainide (TAMBOCOR) 100 MG tablet TAKE 1 TABLET BY MOUTH 2 TIMES A DAY 10/04/21  Yes Deboraha Sprang, MD  furosemide (LASIX) 20 MG tablet Take 40 mg by mouth every morning.    Yes [provider]  levothyroxine (SYNTHROID, LEVOTHROID) 75 MCG tablet Take 75 mcg by mouth daily before breakfast.  09/07/15  Yes [provider]  losartan (COZAAR) 100 MG  tablet Take 1 tablet (100 mg total) by mouth daily. 09/10/21  Yes Mercy Riding, MD  metoprolol tartrate (LOPRESSOR) 50 MG tablet Take 1 tablet (50 mg total) by mouth 2 (two) times daily. 09/21/21  Yes Deboraha Sprang, MD  Vitamin D, Cholecalciferol, 1000 units TABS Take 2,000 Units by mouth daily.   Yes [provider]  zinc sulfate 220 (50 Zn) MG capsule Take 220 mg by mouth daily.   Yes [provider]  acetaminophen (TYLENOL) 500 MG tablet Take 1,000 mg by mouth at bedtime.    [provider]  diltiazem (CARDIZEM) 30 MG tablet Take 1 tablet every 4 hours AS NEEDED for heart rate >100 as long as blood pressure >100. Patient not taking: Reported on 10/18/2021 07/28/20   Deboraha Sprang, MD     Objective    Physical Exam: Vitals:   10/18/21 2215 10/18/21 2230 10/18/21 2245 10/18/21 2300  BP: (!) 83/37 (!) 109/48 (!) 119/46 (!) 107/51  Pulse: 64  66 63  Resp: 12  14 12   Temp:      TempSrc:      SpO2:  100% 97%    General: appears to be stated age; alert, oriented Skin: warm, dry, no rash Head:  AT/ Mouth:  Oral mucosa membranes appear dry, normal dentition Neck: supple; trachea midline Heart:  RRR; did not appreciate any M/R/G Lungs: CTAB, did not appreciate any wheezes, rales, or rhonchi Abdomen: + BS; soft, ND, NT Vascular: 2+ pedal pulses b/l; 2+ radial pulses b/l Extremities: no peripheral edema, no muscle wasting Neuro: strength and sensation intact in upper and lower extremities b/l     Labs on Admission: I have personally reviewed following labs and imaging studies  CBC: Recent Labs  Lab 10/18/21 1132  WBC 13.1*  NEUTROABS 10.1*  HGB 10.5*  HCT 33.6*  MCV 93.1  PLT 123XX123   Basic Metabolic Panel: Recent Labs  Lab 10/18/21 1132 10/18/21 1436  NA 139  --   K 4.8  --   CL 101  --   CO2 26  --   GLUCOSE 147*  --   BUN 36*  --   CREATININE 1.16*  --   CALCIUM 8.7*  --   MG  --  2.1   GFR: CrCl cannot be calculated (Unknown  ideal weight.). Liver Function Tests: Recent Labs  Lab 10/18/21 1132  AST 23  ALT 16  ALKPHOS 75  BILITOT 0.6  PROT 6.5  ALBUMIN 3.2*   No results for input(s): LIPASE, AMYLASE in the last 168 hours. No results for input(s): AMMONIA in the last 168 hours. Coagulation Profile: No results for input(s): INR, PROTIME in the last 168 hours. Cardiac Enzymes: No results for input(s): CKTOTAL, CKMB, CKMBINDEX, TROPONINI in the last 168 hours. BNP (last 3 results) No results for input(s): PROBNP in the last 8760 hours. HbA1C: No results for input(s): HGBA1C in the last 72 hours. CBG: Recent Labs  Lab 10/18/21 1142  GLUCAP 158*   Lipid Profile: No results for input(s): CHOL, HDL, LDLCALC, TRIG, CHOLHDL, LDLDIRECT in the last 72 hours. Thyroid Function Tests: No results for input(s): TSH, T4TOTAL, FREET4, T3FREE, THYROIDAB in the last 72 hours. Anemia Panel: No results for input(s): VITAMINB12, FOLATE, FERRITIN, TIBC, IRON, RETICCTPCT in the last 72 hours. Urine analysis:    Component Value Date/Time   COLORURINE YELLOW (A) 10/18/2021 2244   APPEARANCEUR HAZY (A) 10/18/2021 2244   LABSPEC 1.019 10/18/2021 2244   PHURINE 5.0 10/18/2021 2244   GLUCOSEU NEGATIVE 10/18/2021 2244   HGBUR NEGATIVE 10/18/2021 2244   BILIRUBINUR NEGATIVE 10/18/2021 2244   KETONESUR NEGATIVE 10/18/2021 2244   PROTEINUR NEGATIVE 10/18/2021 2244   NITRITE NEGATIVE 10/18/2021 2244   LEUKOCYTESUR NEGATIVE 10/18/2021 2244    Radiological Exams on Admission: CT HEAD WO CONTRAST (5MM)  Result Date: 10/18/2021 CLINICAL DATA:  Head trauma.  Weakness with a fall yesterday. EXAM: CT HEAD WITHOUT CONTRAST CT CERVICAL SPINE WITHOUT CONTRAST TECHNIQUE: Multidetector CT imaging of the head and cervical spine was performed following the standard protocol without intravenous contrast. Multiplanar CT image reconstructions of the cervical spine were also generated. RADIATION DOSE REDUCTION: This exam was performed  according to the departmental dose-optimization program which includes automated exposure control, adjustment of the mA and/or kV according to patient size and/or use of iterative reconstruction technique. COMPARISON:  None. FINDINGS: CT HEAD FINDINGS Brain: There is no evidence of an acute infarct, intracranial hemorrhage, mass, midline shift, or extra-axial fluid collection. The ventricles and sulci are normal. Vascular: Calcified atherosclerosis at the skull base. No hyperdense vessel. Skull: No acute fracture or suspicious osseous lesion. Sinuses/Orbits: Visualized  paranasal sinuses and mastoid air cells are clear. Unremarkable orbits. Other: None. CT CERVICAL SPINE FINDINGS Alignment: Mild cervical spine straightening.  No listhesis. Skull base and vertebrae: No acute fracture or suspicious osseous lesion. Soft tissues and spinal canal: No prevertebral fluid or swelling. No visible canal hematoma. Disc levels: Moderate disc space narrowing and degenerative endplate sclerosis and spurring at C3-4, C4-5, and C5-6. Mild-to-moderate spinal stenosis and right neural foraminal stenosis at C4-5. Upper chest: Clear lung apices. Other: None. IMPRESSION: 1. No evidence of acute intracranial abnormality. 2. No evidence of acute fracture or subluxation in the cervical spine. Electronically Signed   By: Logan Bores M.D.   On: 10/18/2021 14:34   CT CERVICAL SPINE WO CONTRAST  Result Date: 10/18/2021 CLINICAL DATA:  Head trauma.  Weakness with a fall yesterday. EXAM: CT HEAD WITHOUT CONTRAST CT CERVICAL SPINE WITHOUT CONTRAST TECHNIQUE: Multidetector CT imaging of the head and cervical spine was performed following the standard protocol without intravenous contrast. Multiplanar CT image reconstructions of the cervical spine were also generated. RADIATION DOSE REDUCTION: This exam was performed according to the departmental dose-optimization program which includes automated exposure control, adjustment of the mA and/or kV  according to patient size and/or use of iterative reconstruction technique. COMPARISON:  None. FINDINGS: CT HEAD FINDINGS Brain: There is no evidence of an acute infarct, intracranial hemorrhage, mass, midline shift, or extra-axial fluid collection. The ventricles and sulci are normal. Vascular: Calcified atherosclerosis at the skull base. No hyperdense vessel. Skull: No acute fracture or suspicious osseous lesion. Sinuses/Orbits: Visualized paranasal sinuses and mastoid air cells are clear. Unremarkable orbits. Other: None. CT CERVICAL SPINE FINDINGS Alignment: Mild cervical spine straightening.  No listhesis. Skull base and vertebrae: No acute fracture or suspicious osseous lesion. Soft tissues and spinal canal: No prevertebral fluid or swelling. No visible canal hematoma. Disc levels: Moderate disc space narrowing and degenerative endplate sclerosis and spurring at C3-4, C4-5, and C5-6. Mild-to-moderate spinal stenosis and right neural foraminal stenosis at C4-5. Upper chest: Clear lung apices. Other: None. IMPRESSION: 1. No evidence of acute intracranial abnormality. 2. No evidence of acute fracture or subluxation in the cervical spine. Electronically Signed   By: Logan Bores M.D.   On: 10/18/2021 14:34   DG Knee Complete 4 Views Left  Result Date: 10/18/2021 CLINICAL DATA:  [place].  Left knee pain. EXAM: LEFT KNEE - COMPLETE 4+ VIEW COMPARISON:  None. FINDINGS: The prosthetic components are intact. No periprosthetic fracture. No joint effusion. IMPRESSION: Intact total knee arthroplasty components.  No acute bony findings. Electronically Signed   By: Marijo Sanes M.D.   On: 10/18/2021 15:43   DG Hip Unilat W or Wo Pelvis 2-3 Views Left  Result Date: 10/18/2021 CLINICAL DATA:  [place]. EXAM: DG HIP (WITH OR WITHOUT PELVIS) 2-3V LEFT COMPARISON:  None. FINDINGS: Both hips are normally located. No acute hip fracture. The pubic symphysis and SI joints are intact. No pelvic fractures. IMPRESSION: No acute bony  findings. Electronically Signed   By: Marijo Sanes M.D.   On: 10/18/2021 15:44     EKG: Independently reviewed, with result as described above.    Assessment/Plan    Principal Problem:   Hypotension Active Problems:   Hypothyroidism   Chronic diastolic heart failure (HCC)   Paroxysmal atrial fibrillation (HCC)   Left knee pain   Leukocytosis   Acute prerenal azotemia   Acute anemia    #) Hypotension: In the setting of a documented history of essential hypertension, the patient presented hypotensive  with initial systolic blood pressures in the 70s, with ensuing increase in systolic blood pressure into the low 100s following interval IV fluids, suggestive of an element of intravascular depletion, potentially due to dehydration in the setting of recent decline in oral intake due to intermittent recent nausea, which appears substantiated by the laboratory finding of acute prerenal azotemia.  However, relative to patient's reported typical systolic blood pressures in the 130s, her blood pressure low, although not technically normotensive.  She appears to be mentating well and her initial dizziness associate with systolic blood pressures in the 70s has resolved, and she is otherwise asymptomatic with the exception of residual left knee pain.  Differential also includes the possibility of contribution from intravascular depletion as a result of the presence of mild, acute anemia, without overt source of bleeding, as further quantified below, and notable in the context of yesterday's fall will chronically anticoagulated on Eliquis.  No evidence of new back pain or chest pain to increase the clinical suspicions for a dissection at this time.  Acute pulmonary embolism less likely in the setting of good compliance with chronic anticoagulation.  No overt evidence of ACS, including no recent chest pain, EKG showed no evidence of acute ischemic changes, and nonelevated troponin x2 values.  Mildly  bradycardic at times, but in the context of outpatient beta-blocker. Will further assess for any cardiogenic influence towards presenting hypertension by checking echocardiogram in the morning.   No evidence to suggest underlying infectious process at this time.  There is mild leukocytosis, which may be inflammatory/reactive in nature in setting of yesterday's ground-level mechanical fall.  Urinalysis was not consistent with UTI, will COVID-19/influenza PCR were negative.  Will check chest x-ray to further evaluate.  Patient's recent increase in outpatient dose of losartan from 50 to 100 mg is also noted.    Plan: Continuous IV fluids.  Monitor on telemetry.  Repeat troponin in the morning.  Echocardiogram in the morning.  Check chest x-ray, TSH.  Further evaluation and management acute anemia, as further detailed below.  Repeat CMP/CBC in the morning.  Hold home losartan and Lopressor for now.  Add on procalcitonin.       #) Acute left knee pain: Stemming from ground-level mechanical fall on 2 26,023 in which the left knee was placed on adequate floor.  Plan terms of the left knee demonstrate intact total knee arthroplasty components as well as no acute bony findings, including no evidence of acute fracture.  Patient still reporting some degree of discomfort associated with the left knee.  If without interval improvement overnight, may consider orthopedic surgery consultation/referral.   Plan: Prn acetaminophen elevation of the left lower extremity to assist with gravity driven drainage of any associated edema.Marland Kitchen       #) Acute prerenal azotemia: Noted on presenting labs, suspected to present mild dehydration in the setting of recent decline in oral intake due to nausea.  Creatinine slightly increased to 1.16 relative to most recent prior value of 0.94 on 09/10/2021, although this interval increase does not meet quantitative threshold for diagnosis of acute kidney injury.  We will provide  additional IV fluids, as outlined above, and closely monitor ensuing renal function.  Additionally, interval increase in dose of losartan after 09/10/2021, is also notable, and potentially contributory to this interval increase in creatinine.  In the context of presenting acute anemia, dizziness, hypotension, also consider the possibility of acute upper gastrointestinal bleed given this elevated BUN.  However, clinically, no evidence to suggest  such at this time.     Plan: Continuous NS, as above.  Monitor strict I's and O's and daily weights.  Repeat CMP in the morning.  Pete CBC in the morning.  Holding home losartan for now.        #) Acute anemia: Relative to the patient's baseline hemoglobin range of 12.5-14, with most recent prior hemoglobin noted to be 12.8 on 09/10/2021, presenting hemoglobin is noted to be 10.5, this is over normocytic/normochromic findings as well as nonelevated RDW.  Given her overall picture of dehydration, S interval decline in hemoglobin is notable, as it was anticipated that there may be a degree of hemoconcentration needing false elevation of her hemoglobin at presentation.  Unclear source of anemia at this time.  Will further evaluate via urine studies and additional anemia laboratory evaluation, as further detailed below.  Of note, CT head showed no evidence of acute intracranial hemorrhage, the patient denies any acute abdominal pain/back pain to significantly increase suspicion for retroperitoneal bleed.  Plan: Add on the following to presenting laboratory specimens: Iron studies, MMA, folic acid level, reticulocyte count.  Check INR.  Repeat CBC in the morning.  Type and screen ordered.         #) Paroxysmal atrial fibrillation: Documented history of such. In setting of CHA2DS2-VASc score of  6, there is an indication for chronic anticoagulation for thromboembolic prophylaxis. Consistent with this, patient is chronically anticoagulated on  eliquis. Home AV  nodal blocking regimen: lopressor.  Most recent echocardiogram was performed in April 2019, with results as further described above. Presenting EKG demonstrates sinus rhythm, without evidence of overt acute ischemic changes.  Additionally, she is on flecainide at home.   Plan: monitor strict I's & O's and daily weights. Repeat BMP/CBC in AM. Check serum mag level. Continue home Eliquis and flecainide.  In the setting of presenting hypotension, will hold home beta-blocker for now.           #) Chronic diastolic heart failure: documented history of such, with most recent echocardiogram performed in April 2019 notable for LVEF 65 to 70% as well as normal diastolic parameters. No clinical evidence to suggest acutely decompensated heart failure at this time. home diuretic regimen reportedly consists of the following: Lasix 40 mg p.o. every morning.  Patient appears mildly dehydrated, hold home Lasix for now, plan to reevaluate volume status in the morning.    Plan: monitor strict I's & O's and daily weights. Repeat BMP in AM. Check serum mag level.  Hold home diuretic regimen for now.  Echocardiogram in the morning to further evaluate presenting hypotension, as further detailed above.         #) acquired hypothyroidism: documented h/o such, on Synthroid as outpatient.   Plan: cont home Synthroid.  Check TSH.       DVT prophylaxis: SCD's + home Eliquis Code Status: FullF Disposition Plan: Per Rounding Team Consults called: none;  Admission status: Observation   PLEASE NOTE THAT DRAGON DICTATION SOFTWARE WAS USED IN THE CONSTRUCTION OF THIS NOTE.   [place] DO [facility] [place]   10/19/2021, 1:25 AM

## 2021-10-19 NOTE — Hospital Course (Addendum)
[name] is a 71 y.o. F with dCHF, recent COVID, pAF on Eliquis, HTN, hypothyroidism, hx of splenic infarction and hx venous insufficiency who presented with mechanical fall.  Afterwards developed dizziness/lightheadedness, EMS found with BP 70s/40s.  In the ER, noted to have marked hypotension.  WBC 13K but UA clear, CXR clear.  Did not appear septic.    Started on IV fluids and admitted for dehydration and knee contusion (x-ray negative for fracture).   2/28: [place] leg showed large hematoma

## 2021-10-19 NOTE — Evaluation (Signed)
Physical Therapy Evaluation Patient Details Name: [name] MRN: [id number] DOB: 1950-10-25 Today's Date: 10/19/2021  History of Present Illness  Pt admitted to [facility] on 10/18/21 under observation for weakness, fall, and L knee/hip pain. Pt noted to have pallor, diaphoresis, and hypotension upon admission. Significant PMH includes: dCHF, obesity, paroxysmal Afib (on Eliquis), GAD, HTN, acquired hypothyroidism. Imaging negative for acute abnormality.   Clinical Impression  Pt admitted with above diagnosis. Pt received supine in bed agreeable to partial evaluation. Pt hesitant to perform full eval due to L knee pain. Pt reporting home lay out, DME, assist level at home, and PLOF without difficulty. At baseline pt is mod-I with [facility] for mobility. Does rely on son for household chores and cooking. Today pt able to transfer to EOB with minA at LLE, increased time, and HOB maximally elevated and minA on chuck pad to scoot to EOB for feet to reach floor. Static sitting balance appreciated with near terminal knee extension in LLE. Pt tolerating seated 5 min EOB for skin integrity. Education provided on improving swelling/inflammation in L knee during seated tasks. X2 small, R lat scoots performed to return to supine in bed with maxA for LE's then maxA+2 to return to supine and scooted up in bed. Anticipate pt will be +2 for standing, transfers, and attempted ambulation tasks at this time. Based off of current need for two person assist with bed mobility and deferred attempts to stand, Pt will benefit from STR to progress independence with functional mobility. Pt currently with functional limitations due to the deficits listed below (see PT Problem List). Pt will benefit from skilled PT to increase their independence and safety with mobility to allow discharge to the venue listed below.     Recommendations for follow up therapy are one component of a multi-disciplinary discharge planning process, led by the  attending physician.  Recommendations may be updated based on patient status, additional functional criteria and insurance authorization.  Follow Up Recommendations Skilled nursing-short term rehab (<3 hours/day)    Assistance Recommended at Discharge Intermittent Supervision/Assistance  Patient can return home with the following  Two people to help with walking and/or transfers;Two people to help with bathing/dressing/bathroom;Help with stairs or ramp for entrance;Assist for transportation    Equipment Recommendations Other (comment) (next venue of care)  Recommendations for Other Services       Functional Status Assessment Patient has had a recent decline in their functional status and demonstrates the ability to make significant improvements in function in a reasonable and predictable amount of time.     Precautions / Restrictions Precautions Precautions: Fall Restrictions Weight Bearing Restrictions: No      Mobility  Bed Mobility Overal bed mobility: Needs Assistance Bed Mobility: Supine to Sit, Sit to Supine     Supine to sit: Supervision, Min assist, HOB elevated Sit to supine: Max assist, +2 for physical assistance   General bed mobility comments: minA for supine to sit for scooting for feet on floor. Bed in trendelenburg and +2 assist to return to supine Patient Response: Cooperative  Transfers                   General transfer comment: Sat EOB    Ambulation/Gait               General Gait Details: deferred  Stairs            Wheelchair Mobility    Modified Rankin (Stroke Patients Only)  Balance Overall balance assessment: Needs assistance Sitting-balance support: No upper extremity supported, Feet supported Sitting balance-Leahy Scale: Fair Sitting balance - Comments: Maintains static sitting balance with supervision and LE mobility       Standing balance comment: deferred; requiring +2 assist which did not have today                              Pertinent Vitals/Pain Pain Assessment Pain Assessment: Faces Faces Pain Scale: Hurts little more Pain Location: L knee in dependent positions Pain Descriptors / Indicators: Discomfort, Grimacing, Tightness Pain Intervention(s): Limited activity within patient's tolerance, Monitored during session, Repositioned    Home Living Family/patient expects to be discharged to:: Private residence Living Arrangements: Children Available Help at Discharge: Family Type of Home: House Home Access: Ramped entrance       Home Layout: One level Home Equipment: Agricultural consultant (2 wheels);Rollator (4 wheels);Cane - single point;Toilet riser;Shower seat - built in;Grab bars - toilet;Other (comment) Additional Comments: has lift chair    Prior Function Prior Level of Function : Independent/Modified Independent             Mobility Comments: Ambulatory with SPC. ADLs Comments: assist for socks/shoes; son helps with household chores and meals     Hand Dominance        Extremity/Trunk Assessment   Upper Extremity Assessment Upper Extremity Assessment: Defer to OT evaluation    Lower Extremity Assessment Lower Extremity Assessment: Generalized weakness;LLE deficits/detail LLE Deficits / Details: bruising and swelling at knee    Cervical / Trunk Assessment Cervical / Trunk Assessment: Normal  Communication   Communication: No difficulties  Cognition Arousal/Alertness: Awake/alert Behavior During Therapy: WFL for tasks assessed/performed Overall Cognitive Status: Within Functional Limits for tasks assessed                                          General Comments General comments (skin integrity, edema, etc.): Noticable blisters and swelling along LLE. Blisters on lateral aspect of lower limb proximal tibia region.    Exercises General Exercises - Lower Extremity Long Arc Quad: AROM, Left, 5 reps, Seated Other  Exercises Other Exercises: Role of PT in acute setting, importance of OOB mobility for skin integrity, pulmonary hygiene. Ice and elevation of LLE to improve swelling.   Assessment/Plan    PT Assessment Patient needs continued PT services  PT Problem List Decreased strength;Decreased mobility;Obesity;Decreased activity tolerance;Pain       PT Treatment Interventions DME instruction;Therapeutic exercise;Gait training;Balance training;Stair training;Neuromuscular re-education;Functional mobility training;Therapeutic activities;Patient/family education    PT Goals (Current goals can be found in the Care Plan section)  Acute Rehab PT Goals Patient Stated Goal: to go home, improve L knee pain PT Goal Formulation: With patient Time For Goal Achievement: 11/02/21 Potential to Achieve Goals: Fair    Frequency Min 2X/week     Co-evaluation               AM-PAC PT "6 Clicks" Mobility  Outcome Measure Help needed turning from your back to your side while in a flat bed without using bedrails?: A Lot Help needed moving from lying on your back to sitting on the side of a flat bed without using bedrails?: A Lot Help needed moving to and from a bed to a chair (including a wheelchair)?: Total Help needed standing up from a  chair using your arms (e.g., wheelchair or bedside chair)?: Total Help needed to walk in hospital room?: Total Help needed climbing 3-5 steps with a railing? : Total 6 Click Score: 8    End of Session   Activity Tolerance: Patient tolerated treatment well;Patient limited by pain Patient left: in bed;with call bell/phone within reach;with bed alarm set;with family/visitor present Nurse Communication: Mobility status PT Visit Diagnosis: Other abnormalities of gait and mobility (R26.89);Muscle weakness (generalized) (M62.81);History of falling (Z91.81)    Time: 2355-7322 PT Time Calculation (min) (ACUTE ONLY): 27 min   Charges:   PT Evaluation $PT Eval Moderate  Complexity: 1 Mod PT Treatments $Therapeutic Activity: 8-22 mins       Lorice Lafave M. Fairly IV, PT, DPT Physical Therapist-   [facility]  10/19/2021, 4:11 PM

## 2021-10-19 NOTE — Plan of Care (Signed)

## 2021-10-19 NOTE — Assessment & Plan Note (Addendum)
Iron stores normal.  Guiac negative, not clinically melena, denies melena this week.    Tbili normal, doubt hemolysis, but will rule out.  Given her trauma/fall and Eliquis, I suspect more likely this is hematoma.  If hematoma ruled out, may be artifactual  - Check CT abd/pel - Hold ELiquis until hematoma ruled out - Follow Hapto/LDH

## 2021-10-19 NOTE — Progress Notes (Signed)
PT Cancellation Note  Patient Details Name: [name] MRN: [id number] DOB: [birth year]   Cancelled Treatment:    Reason Eval/Treat Not Completed: Patient not medically ready. PT orders received and pt chart reviewed. Per conversation with Dr. Loleta Books, PT to hold evaluation this morning due to pending abdomen/chest CT to rule out hematoma. Requests that PT come back later today after imaging results, as appropriate, and perform orthostatic testing. PT to follow up later today, as appropriate.     Herminio Commons, PT, DPT 8:54 AM,10/19/21

## 2021-10-19 NOTE — Assessment & Plan Note (Signed)
Blisters on the left leg, I suspect are related to chronic venous insufficiency, not Zoster or cellulitis. - Monitor

## 2021-10-19 NOTE — ED Notes (Signed)
Guiac negative ed md dr Cyril Loosen confirmed

## 2021-10-19 NOTE — Assessment & Plan Note (Signed)
TSH normal ?- Continue levothyroxine ?

## 2021-10-19 NOTE — Assessment & Plan Note (Signed)
BMI 60 

## 2021-10-20 DIAGNOSIS — R21 Rash and other nonspecific skin eruption: Secondary | ICD-10-CM

## 2021-10-20 DIAGNOSIS — E038 Other specified hypothyroidism: Secondary | ICD-10-CM

## 2021-10-20 DIAGNOSIS — D62 Acute posthemorrhagic anemia: Secondary | ICD-10-CM

## 2021-10-20 DIAGNOSIS — W19XXXA Unspecified fall, initial encounter: Secondary | ICD-10-CM

## 2021-10-20 DIAGNOSIS — E861 Hypovolemia: Secondary | ICD-10-CM

## 2021-10-20 DIAGNOSIS — I9589 Other hypotension: Secondary | ICD-10-CM

## 2021-10-20 LAB — BLOOD CULTURE ID PANEL (REFLEXED) - BCID2

## 2021-10-20 LAB — CBC
HCT: 21 % — ABNORMAL LOW (ref 36.0–46.0)
Hemoglobin: 6.8 g/dL — ABNORMAL LOW (ref 12.0–15.0)
MCH: 30 pg (ref 26.0–34.0)
MCHC: 32.4 g/dL (ref 30.0–36.0)
MCV: 92.5 fL (ref 80.0–100.0)
Platelets: 161 10*3/uL (ref 150–400)
RBC: 2.27 MIL/uL — ABNORMAL LOW (ref 3.87–5.11)
RDW: 14.8 % (ref 11.5–15.5)
WBC: 8.4 10*3/uL (ref 4.0–10.5)
nRBC: 0 % (ref 0.0–0.2)

## 2021-10-20 LAB — BASIC METABOLIC PANEL
Anion gap: 8 (ref 5–15)
BUN: 44 mg/dL — ABNORMAL HIGH (ref 8–23)
CO2: 24 mmol/L (ref 22–32)
Calcium: 7.6 mg/dL — ABNORMAL LOW (ref 8.9–10.3)
Chloride: 106 mmol/L (ref 98–111)
Creatinine, Ser: 1.31 mg/dL — ABNORMAL HIGH (ref 0.44–1.00)
GFR, Estimated: 43 mL/min — ABNORMAL LOW (ref >60)
Glucose, Bld: 124 mg/dL — ABNORMAL HIGH (ref 70–99)
Potassium: 4.2 mmol/L (ref 3.5–5.1)
Sodium: 138 mmol/L (ref 135–145)

## 2021-10-20 LAB — CBC WITH DIFFERENTIAL/PLATELET
Abs Immature Granulocytes: 0.04 10*3/uL (ref 0.00–0.07)
Basophils Absolute: 0.1 10*3/uL (ref 0.0–0.1)
Basophils Relative: 1 %
Eosinophils Absolute: 0.2 10*3/uL (ref 0.0–0.5)
Eosinophils Relative: 2 %
HCT: 26.5 % — ABNORMAL LOW (ref 36.0–46.0)
Hemoglobin: 8.7 g/dL — ABNORMAL LOW (ref 12.0–15.0)
Immature Granulocytes: 0 %
Lymphocytes Relative: 19 %
Lymphs Abs: 1.8 10*3/uL (ref 0.7–4.0)
MCH: 29.6 pg (ref 26.0–34.0)
MCHC: 32.8 g/dL (ref 30.0–36.0)
MCV: 90.1 fL (ref 80.0–100.0)
Monocytes Absolute: 1 10*3/uL (ref 0.1–1.0)
Monocytes Relative: 10 %
Neutro Abs: 6.6 10*3/uL (ref 1.7–7.7)
Neutrophils Relative %: 68 %
Platelets: 178 10*3/uL (ref 150–400)
RBC: 2.94 MIL/uL — ABNORMAL LOW (ref 3.87–5.11)
RDW: 14.9 % (ref 11.5–15.5)
WBC: 9.7 10*3/uL (ref 4.0–10.5)
nRBC: 0 % (ref 0.0–0.2)

## 2021-10-20 LAB — PREPARE RBC (CROSSMATCH)

## 2021-10-20 LAB — HAPTOGLOBIN: Haptoglobin: 97 mg/dL (ref 42–346)

## 2021-10-20 MED ORDER — SODIUM CHLORIDE 0.9% IV SOLUTION: Freq: Once | INTRAVENOUS | Status: AC

## 2021-10-20 NOTE — TOC Initial Note (Signed)
Transition of Care (TOC) - Initial/Assessment Note  ? ? ?Patient Details  ?Name: [name] ?MRN: [id number] ?Date of Birth: 1951/03/17 ? ?Transition of Care (TOC) CM/SW Contact:    ?Caryn Section, RN ?Phone Number: ?10/20/2021, 3:19 PM ? ?Clinical Narrative:    Patient lives at home with son; however son works 3p-11p and is often not home during hours she is awake. ? ?Typically, patient has no concerns about transportation and states she is up to date with all appointment.  Patient has no medication concerns. ? ?Patient has DME at home; and states that she has had a nurse from [facility] checking on her since COVID; however that service cannot resume for 6 months.   ? ?Physical Therapy recommends SNF for rehab, patient in amenable to this, stating she would not be able to navigate in home due to weakness.   ? ?SNF workup started, patient prefers [facility] [place], facility notified.  TOC to follow to discharge.             ? ? ?Expected Discharge Plan: Skilled Nursing Facility ?Barriers to Discharge: Continued Medical Work up ? ? ?Patient Goals and CMS Choice ?  ?  ?Choice offered to / list presented to : Patient ? ?Expected Discharge Plan and Services ?Expected Discharge Plan: Skilled Nursing Facility ?  ?Discharge Planning Services: CM Consult ?Post Acute Care Choice: Skilled Nursing Facility ?Living arrangements for the past 2 months: Single Family Home ?                ?  ?  ?  ?  ?  ?  ?  ?  ?  ?  ? ?Prior Living Arrangements/Services ?Living arrangements for the past 2 months: Single Family Home ?Lives with:: Self, Adult Children ?Patient language and need for interpreter reviewed:: Yes (No interpreter required) ?Do you feel safe going back to the place where you live?: Yes (After rehabilitation)      ?Need for Family Participation in Patient Care: Yes (Comment) ?Care giver support system in place?: Yes (comment) ?Current home services: DME (ramp, 2 walkers, commode with lift and bars for  stability.) ?Criminal Activity/Legal Involvement Pertinent to Current Situation/Hospitalization: No - Comment as needed ? ?Activities of Daily Living ?Home Assistive Devices/Equipment: Cane (specify quad or straight) ?ADL Screening (condition at time of admission) ?Patient's cognitive ability adequate to safely complete daily activities?: Yes ?Is the patient deaf or have difficulty hearing?: No ?Does the patient have difficulty seeing, even when wearing glasses/contacts?: No ?Does the patient have difficulty concentrating, remembering, or making decisions?: No ?Patient able to express need for assistance with ADLs?: Yes ?Does the patient have difficulty dressing or bathing?: No ?Independently performs ADLs?: Yes (appropriate for developmental age) ?Does the patient have difficulty walking or climbing stairs?: Yes ?Weakness of Legs: Left ?Weakness of Arms/Hands: None ? ?Permission Sought/Granted ?Permission sought to share information with : Case Production designer, theatre/television/film, Magazine features editor ?Permission granted to share information with : Yes, Verbal Permission Granted ?   ? Permission granted to share info w AGENCY: Potential SNF ?   ?   ? ?Emotional Assessment ?Appearance:: Appears stated age ?Attitude/Demeanor/Rapport: Gracious, Engaged ?Affect (typically observed): Pleasant, Appropriate ?Orientation: : Oriented to Self, Oriented to Place, Oriented to  Time, Oriented to Situation ?Alcohol / Substance Use: Not Applicable ?Psych Involvement: No (comment) ? ?Admission diagnosis:  Leukocytosis [D72.829] ?Hypotension [I95.9] ?Fall, initial encounter [W19.XXXA] ?Hypotension, unspecified hypotension type [I95.9] ?Patient Active Problem List  ? Diagnosis Date Noted  ?  Acute blood loss anemia   ? Fall   ? Left knee pain 10/19/2021  ? Acute anemia 10/19/2021  ? Rash 10/19/2021  ? Hypotension 10/18/2021  ? History of loop recorder - MDT 09/21/2021  ? Anxiety disorder 09/08/2021  ? Chronic diastolic heart failure (HCC) 09/08/2021   ? Recurrent major depression (HCC) 09/08/2021  ? Vitamin D deficiency 09/08/2021  ? Former Tobacco user 09/08/2021  ? 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 Paroxysmal atrial fibrillation (HCC) 09/08/2021  ? Gastroesophageal reflux disease 09/08/2021  ? Elevated troponin 09/08/2021  ? Acute respiratory disease due to COVID-19 virus 09/08/2021  ? Class 3 obesity (HCC) 09/08/2021  ? 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 OA (osteoarthritis) of knee 04/04/2016  ? Essential hypertension 10/15/2015  ? Hypothyroidism 10/15/2015  ? Incarcerated umbilical hernia 10/12/2015  ? ?PCP:  [name], MD ?Pharmacy:   ?[facility] - [place], [place] - [address] ?[address] ?[address] ?[place] [address] ?Phone: [phone] Fax: [phone] ? ? ? ? ?Social Determinants of Health (SDOH) Interventions ?  ? ?Readmission Risk Interventions ?No flowsheet data found. ? ? ?

## 2021-10-20 NOTE — Progress Notes (Signed)
PHARMACY - PHYSICIAN COMMUNICATION ?CRITICAL VALUE ALERT - BLOOD CULTURE IDENTIFICATION (BCID) ? ?[name] is an 71 y.o. female who presented to [facility] on 10/18/2021 with a chief complaint of fall ? ?Assessment:  2/28 blood culture with GPC in 1 of 4 bottles,  BCID detected streptococcus species (Not Grp A, Grp B or pneumococcus).  Knee ultrasound (prev TKA) with complex fluid and likely hematoma.   ? ?Name of physician (or Provider) Contacted: Dr Ouida Sills ? ?Current antibiotics: None ? ?Changes to prescribed antibiotics recommended:  ?Monitor off antibiotics.  Suspect contaminant based on current results.   ? ?Results for orders placed or performed during the hospital encounter of 10/18/21  ?Blood Culture ID Panel (Reflexed) (Collected: 10/19/2021  7:47 PM)  ?Result Value Ref Range  ? Enterococcus faecalis NOT DETECTED NOT DETECTED  ? Enterococcus Faecium NOT DETECTED NOT DETECTED  ? Listeria monocytogenes NOT DETECTED NOT DETECTED  ? Staphylococcus species NOT DETECTED NOT DETECTED  ? Staphylococcus aureus (BCID) NOT DETECTED NOT DETECTED  ? Staphylococcus epidermidis NOT DETECTED NOT DETECTED  ? Staphylococcus lugdunensis NOT DETECTED NOT DETECTED  ? Streptococcus species DETECTED (A) NOT DETECTED  ? Streptococcus agalactiae NOT DETECTED NOT DETECTED  ? Streptococcus pneumoniae NOT DETECTED NOT DETECTED  ? Streptococcus pyogenes NOT DETECTED NOT DETECTED  ? A.calcoaceticus-baumannii NOT DETECTED NOT DETECTED  ? Bacteroides fragilis NOT DETECTED NOT DETECTED  ? Enterobacterales NOT DETECTED NOT DETECTED  ? Enterobacter cloacae complex NOT DETECTED NOT DETECTED  ? Escherichia coli NOT DETECTED NOT DETECTED  ? Klebsiella aerogenes NOT DETECTED NOT DETECTED  ? Klebsiella oxytoca NOT DETECTED NOT DETECTED  ? Klebsiella pneumoniae NOT DETECTED NOT DETECTED  ? Proteus species NOT DETECTED NOT DETECTED  ? Salmonella species NOT DETECTED NOT DETECTED  ? Serratia marcescens NOT DETECTED NOT DETECTED  ?  Haemophilus influenzae NOT DETECTED NOT DETECTED  ? Neisseria meningitidis NOT DETECTED NOT DETECTED  ? Pseudomonas aeruginosa NOT DETECTED NOT DETECTED  ? Stenotrophomonas maltophilia NOT DETECTED NOT DETECTED  ? Candida albicans NOT DETECTED NOT DETECTED  ? Candida auris NOT DETECTED NOT DETECTED  ? Candida glabrata NOT DETECTED NOT DETECTED  ? Candida krusei NOT DETECTED NOT DETECTED  ? Candida parapsilosis NOT DETECTED NOT DETECTED  ? Candida tropicalis NOT DETECTED NOT DETECTED  ? Cryptococcus neoformans/gattii NOT DETECTED NOT DETECTED  ? ?Doreene Eland, PharmD, BCPS, BCIDP ?Work Cell: [phone] ?10/20/2021 12:09 PM ? ? ? ?

## 2021-10-20 NOTE — Progress Notes (Signed)
PROGRESS NOTE  [name]    DOB: 1951/01/17, 71 y.o.  HYI:[id number]    Code Status: Full Code   DOA: 10/18/2021   LOS: 1   Brief hospital course  [name] is a 71 y.o. female with a PMH significant for PAF on Eliquis, CAD, CHF, HTN, hypothyroidism. They presented from home to the ED on 10/18/2021 with mechanical fall day prior to presentation.  She states that she tripped on a rug and her left knee slid across the carpet.  She had a mild head injury without loss of consciousness.  She has history of arthroplasty of the left knee. In the ED, it was found that they had negative head CT. her left knee showed abrasions and blisters.  X-rays of hip, pelvis, knee showed intact arthroplasty and no acute bony abnormalities.  She was notably hypotensive but asymptomatic. They were treated with Zofran, analgesia, fluid bolus.  Patient was admitted to medicine service for further workup and management of knee pain, anemia as outlined in detail below.  10/20/21 -hemoglobin decreased to 6.8  Assessment & Plan  Principal Problem:   Hypotension Active Problems:   Essential hypertension   Hypothyroidism   Chronic diastolic heart failure (HCC)   Recurrent major depression (HCC)   Paroxysmal atrial fibrillation (HCC)   Class 3 obesity (HCC)   Left knee pain   Acute anemia   Rash  Hypotension-improved since increased p.o. intake and fluid support.  Likely secondary to anemia from hematoma -Monitor closely - holding home antihypertensives  Left thigh hematoma- as seen on [place]. Positive tenderness to palpation.  -Holding Eliquis - monitor closely - PT/OT  Acute blood loss anemia-hemoglobin 12.8 (baseline)>10.5>8.0>6.8 -2 units packed red blood cells ordered for transfusion today -Posttransfusion CBC -CBC a.m.  Rash- located on lateral left knee. Blisters have increased since photos taken on admission. Tender to touch, negative nikolsky sign. 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  VTE ppx: SCDs Start: 10/18/21 2246  Diet:     Diet   Diet regular Room service appropriate? Yes; Fluid consistency: Thin   Subjective 10/20/21    Pt reports feeling improved but overall fatigued. She continues to have tenderness of her left leg.    Objective   Vitals:   10/19/21 2050 10/20/21 0200 10/20/21 0416 10/20/21 0449  BP: (!) 109/45 (!) 117/46 (!) 117/46 (!) 129/52  Pulse: 84 82 79 80  Resp: 18 18 20 18   Temp: 98.6 F (37 C) 98.4 F (36.9 C) 97.9 F (36.6 C) 98.2 F (36.8 C)  TempSrc: Oral Oral Oral Oral  SpO2: 95% 95% 94% 96%  Weight:   (!) 159.1 kg     Intake/Output Summary (Last 24 hours) at 10/20/2021 0735 Last data filed at 10/20/2021 12/20/2021 Gross per 24 hour  Intake 3196.96 ml  Output --  Net 3196.96 ml   Filed Weights   10/20/21 0416  Weight: (!) 159.1 kg     Physical Exam:  General: awake, alert, NAD HEENT: atraumatic, clear conjunctiva, anicteric sclera, MMM, hearing grossly normal Respiratory: normal respiratory effort. Cardiovascular:  quick capillary refill  Nervous: A&O x3.  no gross focal neurologic deficits, normal speech Extremities: left knee positive for ecchymosis and overlying firm blisters up to 6cm in diameter which are tender to palpation and negative nikolsky sign. Evidence that there has been at least one unroofed with clear fluid in bed Psychiatry: normal mood, congruent affect  Labs   I have personally reviewed the following labs and imaging studies CBC    Component Value Date/Time    WBC 8.4 10/20/2021 0614   RBC 2.27 (L) 10/20/2021 0614   HGB 6.8 (L) 10/20/2021 0614   HGB 13.9 02/02/2021 1201   HGB 13.9 06/02/2010 1458   HCT 21.0 (L) 10/20/2021 0614   HCT 42.5 02/02/2021 1201   HCT 40.1 06/02/2010 1458   PLT 161 10/20/2021 0614   PLT 253 02/02/2021 1201   MCV 92.5 10/20/2021 0614   MCV 88 02/02/2021 1201   MCV 85.2 06/02/2010 1458   MCH 30.0 10/20/2021 0614   MCHC 32.4 10/20/2021 0614   RDW 14.8 10/20/2021 0614   RDW 13.6 02/02/2021 1201   RDW 14.7 (H) 06/02/2010 1458   LYMPHSABS 1.8 10/19/2021 0539   LYMPHSABS 1.6 02/02/2021 1201   LYMPHSABS 2.4 06/02/2010 1458   MONOABS 1.0 10/19/2021 0539   MONOABS 0.7 06/02/2010 1458   EOSABS 0.1 10/19/2021 0539   EOSABS 0.3 02/02/2021 1201   BASOSABS 0.1 10/19/2021 0539   BASOSABS 0.1 02/02/2021 1201   BASOSABS 0.1 06/02/2010 1458   BMP Latest Ref Rng & Units 10/19/2021 10/18/2021 09/10/2021  Glucose 70 - 99 mg/dL 193(X) 902(I) 097(D)  BUN 8 - 23 mg/dL 53(G) 99(M) 42(A)  Creatinine 0.44 - 1.00 mg/dL 8.34(H) 9.62(I) 2.97  BUN/Creat Ratio 12 - 28 - - -  Sodium 135 - 145 mmol/L 142 139 136  Potassium 3.5 - 5.1 mmol/L 4.2 4.8 4.0  Chloride 98 - 111 mmol/L 111 101 104  CO2 22 - 32 mmol/L 28 26 26   Calcium 8.9 - 10.3 mg/dL 7.7(L) 8.7(L) 8.3(L)    CT HEAD WO CONTRAST ( )  Result Date: 10/18/2021 CLINICAL DATA:  Head trauma.  Weakness with a fall yesterday. EXAM: CT HEAD WITHOUT CONTRAST CT CERVICAL SPINE WITHOUT CONTRAST TECHNIQUE: Multidetector CT imaging of the head and cervical spine was performed following the standard protocol without intravenous contrast. Multiplanar CT image reconstructions of the cervical spine were also generated. RADIATION DOSE REDUCTION: This exam was performed according to the departmental dose-optimization program which includes automated exposure control, adjustment of the mA and/or kV according to patient size and/or use of iterative reconstruction technique. COMPARISON:  None. FINDINGS: CT  HEAD FINDINGS Brain: There is no evidence of an acute infarct, intracranial hemorrhage, mass, midline shift, or extra-axial fluid collection. The ventricles and sulci are normal. Vascular: Calcified atherosclerosis at the skull base. No hyperdense vessel. Skull: No acute fracture or suspicious osseous lesion. Sinuses/Orbits: Visualized paranasal sinuses and mastoid air cells are clear. Unremarkable orbits. Other: None. CT CERVICAL SPINE FINDINGS Alignment: Mild cervical spine straightening.  No listhesis. Skull base and vertebrae: No acute fracture or suspicious osseous lesion. Soft tissues and spinal canal: No prevertebral fluid or swelling. No visible canal hematoma. Disc levels: Moderate disc space narrowing and degenerative endplate sclerosis and spurring at C3-4, C4-5, and C5-6. Mild-to-moderate spinal stenosis and right neural foraminal stenosis at C4-5. Upper chest: Clear lung apices. Other: None. IMPRESSION: 1. No evidence of acute intracranial abnormality. 2. No evidence of acute fracture or subluxation in the cervical spine. Electronically Signed   By: 10/20/2021 M.D.   On:  10/18/2021 14:34   CT CERVICAL SPINE WO CONTRAST  Result Date: 10/18/2021 CLINICAL DATA:  Head trauma.  Weakness with a fall yesterday. EXAM: CT HEAD WITHOUT CONTRAST CT CERVICAL SPINE WITHOUT CONTRAST TECHNIQUE: Multidetector CT imaging of the head and cervical spine was performed following the standard protocol without intravenous contrast. Multiplanar CT image reconstructions of the cervical spine were also generated. RADIATION DOSE REDUCTION: This exam was performed according to the departmental dose-optimization program which includes automated exposure control, adjustment of the mA and/or kV according to patient size and/or use of iterative reconstruction technique. COMPARISON:  None. FINDINGS: CT HEAD FINDINGS Brain: There is no evidence of an acute infarct, intracranial hemorrhage, mass, midline shift, or extra-axial fluid  collection. The ventricles and sulci are normal. Vascular: Calcified atherosclerosis at the skull base. No hyperdense vessel. Skull: No acute fracture or suspicious osseous lesion. Sinuses/Orbits: Visualized paranasal sinuses and mastoid air cells are clear. Unremarkable orbits. Other: None. CT CERVICAL SPINE FINDINGS Alignment: Mild cervical spine straightening.  No listhesis. Skull base and vertebrae: No acute fracture or suspicious osseous lesion. Soft tissues and spinal canal: No prevertebral fluid or swelling. No visible canal hematoma. Disc levels: Moderate disc space narrowing and degenerative endplate sclerosis and spurring at C3-4, C4-5, and C5-6. Mild-to-moderate spinal stenosis and right neural foraminal stenosis at C4-5. Upper chest: Clear lung apices. Other: None. IMPRESSION: 1. No evidence of acute intracranial abnormality. 2. No evidence of acute fracture or subluxation in the cervical spine. Electronically Signed   By: Sebastian AcheAllen  Grady M.D.   On: 10/18/2021 14:34   CT ABDOMEN PELVIS W CONTRAST  Result Date: 10/19/2021 CLINICAL DATA:  Anemia, suspected retroperitoneal bleed/hematoma EXAM: CT ABDOMEN AND PELVIS WITH CONTRAST TECHNIQUE: Multidetector CT imaging of the abdomen and pelvis was performed using the standard protocol following bolus administration of intravenous contrast. RADIATION DOSE REDUCTION: This exam was performed according to the departmental dose-optimization program which includes automated exposure control, adjustment of the mA and/or kV according to patient size and/or use of iterative reconstruction technique. CONTRAST:  80mL OMNIPAQUE IOHEXOL 350 MG/ML SOLN IV. No oral contrast. COMPARISON:  12/08/2017 FINDINGS: Lower chest: Mild bibasilar atelectasis Hepatobiliary: Gallbladder surgically absent. Liver normal appearance Pancreas: Normal appearance Spleen: Normal appearance Adrenals/Urinary Tract: Adrenal glands, kidneys, ureters, and bladder normal appearance Stomach/Bowel:  Normal appendix. Stool in rectum. Scattered colonic diverticulosis without evidence of diverticulitis. Tiny hiatal hernia. Stomach and bowel loops otherwise normal appearance. Vascular/Lymphatic: Atherosclerotic calcification aorta and iliac arteries without aneurysm. No adenopathy. Reproductive: Unremarkable uterus and ovaries Other: No free air or free fluid. No hernia or inflammatory process. No retroperitoneal hemorrhage or infiltration. Musculoskeletal: Diffuse osseous demineralization. Scattered degenerative disc disease changes of thoracolumbar spine. IMPRESSION: Colonic diverticulosis without evidence of diverticulitis. Tiny hiatal hernia. No acute intra-abdominal or intrapelvic abnormalities. Specifically, no evidence of retroperitoneal hemorrhage. Aortic Atherosclerosis (ICD10-I70.0). Electronically Signed   By: Ulyses SouthwardMark  Boles M.D.   On: 10/19/2021 11:06   DG Chest Port 1 View  Result Date: 10/19/2021 CLINICAL DATA:  Leukocytosis. EXAM: PORTABLE CHEST 1 VIEW COMPARISON:  Chest radiograph dated 09/08/2021. FINDINGS: No focal consolidation, pleural effusion, pneumothorax. Top-normal cardiac size. Difficult device. No acute osseous pathology. IMPRESSION: No acute cardiopulmonary process. Electronically Signed   By: Elgie CollardArash  Radparvar M.D.   On: 10/19/2021 02:38   DG Knee Complete 4 Views Left  Result Date: 10/18/2021 CLINICAL DATA:  Larey SeatFell.  Left knee pain. EXAM: LEFT KNEE - COMPLETE 4+ VIEW COMPARISON:  None. FINDINGS: The prosthetic components are intact. No periprosthetic fracture. No  joint effusion. IMPRESSION: Intact total knee arthroplasty components.  No acute bony findings. Electronically Signed   By: Rudie Meyer M.D.   On: 10/18/2021 15:43   [place] LT LOWER EXTREM LTD SOFT TISSUE NON VASCULAR  Result Date: 10/19/2021 CLINICAL DATA:  Acute blood loss.  Anemia.  Evaluate thigh hematoma. EXAM: ULTRASOUND left LOWER EXTREMITY LIMITED TECHNIQUE: Ultrasound examination of the lower extremity soft  tissues was performed in the area of clinical concern. COMPARISON:  None. FINDINGS: Scanning of the left thigh. There is diffuse soft tissue edema. Anterior to the knee, there is a complex hypoechoic fluid collection measuring 7.6 x 2.2 x 6.6 cm. This most compatible with soft tissue hematoma anterior to the knee. Prior knee replacement noted on x-ray. No hypervascularity on Doppler. IMPRESSION: Complex fluid collection anterior to the knee compatible with soft tissue hematoma. Electronically Signed   By: Marlan Palau M.D.   On: 10/19/2021 18:31   DG Hip Unilat W or Wo Pelvis 2-3 Views Left  Result Date: 10/18/2021 CLINICAL DATA:  Larey Seat. EXAM: DG HIP (WITH OR WITHOUT PELVIS) 2-3V LEFT COMPARISON:  None. FINDINGS: Both hips are normally located. No acute hip fracture. The pubic symphysis and SI joints are intact. No pelvic fractures. IMPRESSION: No acute bony findings. Electronically Signed   By: Rudie Meyer M.D.   On: 10/18/2021 15:44    Disposition Plan & Communication  Patient status: Inpatient  Admitted From: Home Planned disposition location: Skilled nursing facility Anticipated discharge date: 3/6 pending hemoglobin stability and improvement of pain/skin lesion  Family Communication: none    Author: Leeroy Bock, DO [facility] 10/20/2021, 7:35 AM   Available by Epic secure chat 7AM-7PM. If 7PM-7AM, please contact night-coverage.  [facility] contact information found on [email or web address].

## 2021-10-20 NOTE — [place] FL2 (Signed)
?[place] MEDICAID FL2 LEVEL OF CARE SCREENING TOOL  ?  ? ?IDENTIFICATION  ?Patient Name: ?[name] Birthdate: 1950-12-17 Sex: female Admission Date (Current Location): ?10/18/2021  ?[place]: ? [place] ?  Facility and Address:  ?[facility], [address] [place], [place] [address] ?     Provider Number: ?[id number]  ?Attending Physician Name and Address:  ?Leeroy Bock, MD ? Relative Name and Phone Number:  ?Buckbee,Brian Shari Heritage)   [phone] [employer] [facility] ?   ?Current Level of Care: ?Hospital Recommended Level of Care: ?Skilled Nursing Facility Prior Approval Number: ?  ? ?Date Approved/Denied: ?  PASRR Number: ?[id number] A ? ?Discharge Plan: ?SNF ?  ? ?Current Diagnoses: ?Patient Active Problem List  ? Diagnosis Date Noted  ? Acute blood loss anemia   ? Fall   ? Left knee pain 10/19/2021  ? Acute anemia 10/19/2021  ? Rash 10/19/2021  ? Hypotension 10/18/2021  ? History of loop recorder - MDT 09/21/2021  ? Anxiety disorder 09/08/2021  ? Chronic diastolic heart failure (HCC) 09/08/2021  ? Recurrent major depression (HCC) 09/08/2021  ? Vitamin D deficiency 09/08/2021  ? Former Tobacco user 09/08/2021  ? 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 Paroxysmal atrial fibrillation (HCC) 09/08/2021  ? Gastroesophageal reflux disease 09/08/2021  ? Elevated troponin 09/08/2021  ? Acute respiratory disease due to COVID-19 virus 09/08/2021  ? Class 3 obesity (HCC) 09/08/2021  ? 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 OA (osteoarthritis) of knee 04/04/2016  ? Essential hypertension 10/15/2015  ? Hypothyroidism 10/15/2015  ? Incarcerated umbilical hernia 10/12/2015  ? ? ?Orientation RESPIRATION BLADDER Height & Weight   ?  ?Self, Time, Situation, Place ? Normal Continent Weight: (!) 159.1 kg ?Height:     ?BEHAVIORAL SYMPTOMS/MOOD NEUROLOGICAL BOWEL NUTRITION STATUS  ?    Continent Diet (Regular)  ?AMBULATORY STATUS COMMUNICATION OF NEEDS Skin   ?Extensive Assist (sit to supine +2 ambulation  deferred) Verbally   ?  ?  ?  ?    ?     ?     ? ? ?Personal Care Assistance Level of Assistance  ?Bathing, Feeding, Dressing Bathing Assistance: Limited assistance ?Feeding assistance: Limited assistance ?Dressing Assistance: Limited assistance ?   ? ?Functional Limitations Info  ?Sight, Hearing, Speech Sight Info: Adequate ?Hearing Info: Adequate ?Speech Info: Adequate  ? ? ?SPECIAL CARE FACTORS FREQUENCY  ?PT (By licensed PT), OT (By licensed OT)   ?  ?PT Frequency: Min 5x weekly ?OT Frequency: Min 5x weekly ?  ?  ?  ?   ? ? ?Contractures Contractures Info: Not present  ? ? ?Additional Factors Info  ?Code Status, Allergies Code Status Info: FULL CODE ?Allergies Info: Augmentin (amoxicillin-pot Clavulanate) Vaccines ?  ?  ?  ?   ? ?Current Medications (10/20/2021):  This is the current hospital active medication list ?Current Facility-Administered Medications  ?Medication Dose Route Frequency Provider Last Rate Last Admin  ? 0.9 %  sodium chloride infusion   Intravenous Continuous Danford, Earl Lites, MD 200 mL/hr at 10/20/21 1524 Infusion Verify at 10/20/21 1524  ? acetaminophen (TYLENOL) tablet 1,000 mg  1,000 mg Oral TID Alberteen Sam, MD   1,000 mg at 10/20/21 1103  ? escitalopram (LEXAPRO) tablet 5 mg  5 mg Oral Daily Howerter, Justin B, DO   5 mg at 10/20/21 1102  ? flecainide (TAMBOCOR) tablet 100 mg  100 mg Oral BID Howerter, Justin B, DO   100 mg at 10/20/21 1103  ? iohexol (OMNIPAQUE) 9  MG/ML oral solution 500 mL  500 mL Oral Once PRN Danford, Earl Lites, MD      ? levothyroxine (SYNTHROID) tablet 75 mcg  75 mcg Oral Q0600 Howerter, Justin B, DO   75 mcg at 10/20/21 0526  ? oxyCODONE (Oxy IR/ROXICODONE) immediate release tablet 2.5 mg  2.5 mg Oral Q6H PRN Alberteen Sam, MD   2.5 mg at 10/20/21 1116  ? ? ? ?Discharge Medications: ?Please see discharge summary for a list of discharge medications. ? ?Relevant Imaging Results: ? ?Relevant Lab Results: ? ? ?Additional Information ?SSN  [id number] ? ?Caryn Section, RN ? ? ? ? ?

## 2021-10-20 NOTE — Evaluation (Signed)
Occupational Therapy Evaluation ?Patient Details ?Name: [name] ?MRN: [id number] ?DOB: 1951/07/21 ?Today's Date: 10/20/2021 ? ? ?History of Present Illness [name] admitted to [facility] on 10/18/21 under observation for weakness, fall, and L knee/hip pain. [name] noted to have pallor, diaphoresis, and hypotension upon admission. Significant PMH includes: dCHF, obesity, paroxysmal Afib (on Eliquis), GAD, HTN, acquired hypothyroidism. Imaging negative for acute abnormality.  ? ?Clinical Impression ?  ?[name] was seen for OT evaluation this date. Prior to hospital admission, [name] was MOD I using SPC. [name] lives with family in home c ramped entrance. [name] presents to acute OT demonstrating impaired ADL performance and functional mobility 2/2 decreased activity tolerance and functional strength/ROM/balance deficits. [name] currently requires MOD A bathing at bed level - [name] completes chest and periarea, assist for BLE and rear. MIN A rolling L+R. Deferred mobility per RN, [name] receiving blood and low BP. [name] would benefit from skilled OT to address noted impairments and functional limitations (see below for any additional details). Upon hospital discharge, recommend STR to maximize [name] safety and return to PLOF.  ?   ? ?Recommendations for follow up therapy are one component of a multi-disciplinary discharge planning process, led by the attending physician.  Recommendations may be updated based on patient status, additional functional criteria and insurance authorization.  ? ?Follow Up Recommendations ? Skilled nursing-short term rehab (<3 hours/day)  ?  ?Assistance Recommended at Discharge Intermittent Supervision/Assistance  ?Patient can return home with the following Two people to help with walking and/or transfers;Two people to help with bathing/dressing/bathroom ? ?  ?Functional Status Assessment ? Patient has had a recent decline in their functional status and demonstrates the ability to make significant improvements in function in a  reasonable and predictable amount of time.  ?Equipment Recommendations ? Other (comment) (defer to next venue of care)  ?  ?Recommendations for Other Services   ? ? ?  ?Precautions / Restrictions Precautions ?Precautions: Fall ?Restrictions ?Weight Bearing Restrictions: No  ? ?  ? ?Mobility Bed Mobility ?Overal bed mobility: Needs Assistance ?Bed Mobility: Rolling ?Rolling: Min assist ?  ?  ?  ?  ?General bed mobility comments: X2 for scooting higher in bed ?  ? ?Transfers ?  ?  ?  ?  ?  ?  ?  ?  ?  ?General transfer comment: not tested per RN and [name] getting blood/low BP ?  ? ?  ?   ? ?ADL either performed or assessed with clinical judgement  ? ?ADL Overall ADL's : Needs assistance/impaired ?  ?  ?  ?  ?  ?  ?  ?  ?  ?  ?  ?  ?  ?  ?  ?  ?  ?  ?  ?General ADL Comments: MOD A bathing at bed level - [name] completes chest and periarea, assist for BLE and rear.  ? ? ? ? ?Pertinent Vitals/Pain Pain Assessment ?Pain Assessment: Faces ?Faces Pain Scale: Hurts a little bit ?Pain Location: L knee in dependent positions ?Pain Descriptors / Indicators: Discomfort, Grimacing, Tightness ?Pain Intervention(s): Limited activity within patient's tolerance, Repositioned  ? ? ? ?Hand Dominance   ?  ?Extremity/Trunk Assessment Upper Extremity Assessment ?Upper Extremity Assessment: Generalized weakness ?  ?Lower Extremity Assessment ?Lower Extremity Assessment: Generalized weakness ?  ?  ?  ?Communication Communication ?Communication: No difficulties ?  ?Cognition Arousal/Alertness: Awake/alert ?Behavior During Therapy: [facility] for tasks assessed/performed ?Overall Cognitive Status: Within Functional Limits for tasks assessed ?  ?  ?  ?  ?  ?  ?  ?  ?  ?  ?  ?  ?  ?  ?  ?  ?  ?  ?  ?   ?   ?   ? ? ?  Home Living Family/patient expects to be discharged to:: Private residence ?Living Arrangements: Children ?Available Help at Discharge: Family ?Type of Home: House ?Home Access: Ramped entrance ?  ?  ?Home Layout: One level ?  ?  ?Bathroom  Shower/Tub: Walk-in shower ?  ?Bathroom Toilet: Standard ?Bathroom Accessibility: Yes ?  ?Home Equipment: Agricultural consultant (2 wheels);Rollator (4 wheels);Cane - single point;Toilet riser;Shower seat - built in;Grab bars - toilet;Other (comment) ?  ?Additional Comments: has lift chair ?  ? ?  ?Prior Functioning/Environment Prior Level of Function : Independent/Modified Independent ?  ?  ?  ?  ?  ?  ?Mobility Comments: Ambulatory with SPC. ?ADLs Comments: assist for socks/shoes; son helps with household chores and meals ?  ? ?  ?  ?OT Problem List: Decreased strength;Decreased range of motion;Decreased activity tolerance;Impaired balance (sitting and/or standing);Decreased safety awareness ?  ?   ?OT Treatment/Interventions: Self-care/ADL training;Therapeutic exercise;Energy conservation;DME and/or AE instruction;Therapeutic activities;Patient/family education;Balance training  ?  ?OT Goals(Current goals can be found in the care plan section) Acute Rehab OT Goals ?Patient Stated Goal: to walk ?OT Goal Formulation: With patient ?Time For Goal Achievement: 11/03/21 ?Potential to Achieve Goals: Good ?ADL Goals ?[name] Will Perform Grooming: sitting;with set-up;with supervision ?[name] Will Perform Lower Body Dressing: with mod assist;sitting/lateral leans ?[name] Will Transfer to Toilet: with mod assist;squat pivot transfer;bedside commode  ?OT Frequency: Min 2X/week ?  ? ?   ?AM-PAC OT "6 Clicks" Daily Activity     ?Outcome Measure Help from another person eating meals?: A Little ?Help from another person taking care of personal grooming?: A Little ?Help from another person toileting, which includes using toliet, bedpan, or urinal?: A Lot ?Help from another person bathing (including washing, rinsing, drying)?: A Lot ?Help from another person to put on and taking off regular upper body clothing?: A Little ?Help from another person to put on and taking off regular lower body clothing?: A Lot ?6 Click Score: 15 ?  ?End of Session    ? ?Activity Tolerance: Patient tolerated treatment well;Patient limited by fatigue ?Patient left: in bed;with call bell/phone within reach;with nursing/sitter in room ? ?OT Visit Diagnosis: Other abnormalities of gait and mobility (R26.89);Muscle weakness (generalized) (M62.81)  ?              ?Time: 9024-0973 ?OT Time Calculation (min): 19 min ?Charges:  OT General Charges ?$OT Visit: 1 Visit ?OT Evaluation ?$OT Eval Low Complexity: 1 Low ?OT Treatments ?$Self Care/Home Management : 8-22 mins ? ?Kathie Dike, M.S. OTR/L  ?10/20/21, 3:03 PM  ?ascom [phone] ? ?

## 2021-10-21 DIAGNOSIS — F330 Major depressive disorder, recurrent, mild: Secondary | ICD-10-CM

## 2021-10-21 LAB — CBC
HCT: 26.7 % — ABNORMAL LOW (ref 36.0–46.0)
Hemoglobin: 8.6 g/dL — ABNORMAL LOW (ref 12.0–15.0)
MCH: 29.7 pg (ref 26.0–34.0)
MCHC: 32.2 g/dL (ref 30.0–36.0)
MCV: 92.1 fL (ref 80.0–100.0)
Platelets: 146 10*3/uL — ABNORMAL LOW (ref 150–400)
RBC: 2.9 MIL/uL — ABNORMAL LOW (ref 3.87–5.11)
RDW: 15.3 % (ref 11.5–15.5)
WBC: 7.6 10*3/uL (ref 4.0–10.5)
nRBC: 0 % (ref 0.0–0.2)

## 2021-10-21 MED ORDER — DOXYCYCLINE HYCLATE 100 MG PO TABS
100.0000 mg | ORAL_TABLET | Freq: Two times a day (BID) | ORAL | Status: DC
  Administered 2021-10-21 – 2021-10-25 (×9): 100 mg via ORAL
  Filled 2021-10-21 (×9): qty 1

## 2021-10-21 NOTE — Progress Notes (Signed)
Occupational Therapy Treatment ?Patient Details ?Name: [name] ?MRN: [id number] ?DOB: May 23, 1951 ?Today's Date: 10/21/2021 ? ? ?History of present illness Pt admitted to [facility] on 10/18/21 under observation for weakness, fall, and L knee/hip pain. Pt noted to have pallor, diaphoresis, and hypotension upon admission. Significant PMH includes: dCHF, obesity, paroxysmal Afib (on Eliquis), GAD, HTN, acquired hypothyroidism. Imaging negative for acute abnormality. ?  ?OT comments ? Ms Poeschel was seen for OT treatment on this date. Upon arrival to room pt reclined in bed, on bedpan, agreeable to tx. Pt requires SUPERVISION exit L side of bed. MIN A + RW for BSC t/f, +2 for equipment mgmt. MAX A perihygiene in standing, +2 for safety. Requires 2 trials to complete. MOD A x2 + RW don underwear. Left in chair with all needs in reach. Pt making good progress toward goals. Pt continues to benefit from skilled OT services to maximize return to PLOF and minimize risk of future falls, injury, caregiver burden, and readmission. Will continue to follow POC. Discharge recommendation remains appropriate.  ?  ? ?Recommendations for follow up therapy are one component of a multi-disciplinary discharge planning process, led by the attending physician.  Recommendations may be updated based on patient status, additional functional criteria and insurance authorization. ?   ?Follow Up Recommendations ? Skilled nursing-short term rehab (<3 hours/day)  ?  ?Assistance Recommended at Discharge Intermittent Supervision/Assistance  ?Patient can return home with the following ? A little help with walking and/or transfers;A lot of help with bathing/dressing/bathroom;Help with stairs or ramp for entrance ?  ?Equipment Recommendations ? BSC/3in1 (bariatric BSC)  ?  ?Recommendations for Other Services   ? ?  ?Precautions / Restrictions Precautions ?Precautions: Fall ?Restrictions ?Weight Bearing Restrictions: No  ? ? ?  ? ?Mobility Bed  Mobility ?Overal bed mobility: Needs Assistance ?Bed Mobility: Rolling, Supine to Sit ?Rolling: Min assist ?  ?Supine to sit: Supervision, HOB elevated ?  ?  ?  ?  ? ?Transfers ?Overall transfer level: Needs assistance ?Equipment used: Rolling walker (2 wheels) ?Transfers: Sit to/from Stand, Bed to chair/wheelchair/BSC ?Sit to Stand: Min assist, Mod assist, +2 physical assistance ?  ?  ?Step pivot transfers: Min assist, +2 safety/equipment ?  ?  ?  ?  ?  ?Balance Overall balance assessment: Needs assistance ?Sitting-balance support: No upper extremity supported, Feet supported ?Sitting balance-Leahy Scale: Good ?Sitting balance - Comments: steady sitting reaching within BOS ?  ?Standing balance support: No upper extremity supported ?Standing balance-Leahy Scale: Good ?Standing balance comment: CGA for safety standing reaching within BOS ?  ?  ?  ?  ?  ?  ?  ?  ?  ?  ?  ?   ? ?ADL either performed or assessed with clinical judgement  ? ?ADL Overall ADL's : Needs assistance/impaired ?  ?  ?  ?  ?  ?  ?  ?  ?  ?  ?  ?  ?  ?  ?  ?  ?  ?  ?  ?General ADL Comments: MIN A + RW for BSC t/f, +2 for equipment mgmt. MAX A perihygiene in standing, +2 for safety. MOD A x2 + RW don underwear ?  ? ? ? ?Cognition Arousal/Alertness: Awake/alert ?Behavior During Therapy: [facility] for tasks assessed/performed ?Overall Cognitive Status: Within Functional Limits for tasks assessed ?  ?  ?  ?  ?  ?  ?  ?  ?  ?  ?  ?  ?  ?  ?  ?  ?  ?  ?  ?   ?   ?   ?  General Comments dressing noted to L LE  ? ? ?Pertinent Vitals/ Pain       Pain Assessment ?Pain Assessment: 0-10 ?Pain Score: 1  ?Pain Location: L knee ?Pain Descriptors / Indicators: Sore ?Pain Intervention(s): Limited activity within patient's tolerance, Repositioned ? ? ?Frequency ? Min 2X/week  ? ? ? ? ?  ?Progress Toward Goals ? ?OT Goals(current goals can now be found in the care plan section) ? Progress towards OT goals: Progressing toward goals ? ?Acute Rehab OT Goals ?Patient Stated  Goal: to walk better ?OT Goal Formulation: With patient ?Time For Goal Achievement: 11/03/21 ?Potential to Achieve Goals: Good ?ADL Goals ?Pt Will Perform Grooming: sitting;with set-up;with supervision ?Pt Will Perform Lower Body Dressing: with mod assist;sitting/lateral leans ?Pt Will Transfer to Toilet: with mod assist;squat pivot transfer;bedside commode  ?Plan Discharge plan remains appropriate;Frequency remains appropriate   ? ?Co-evaluation ? ? ? PT/OT/SLP Co-Evaluation/Treatment: Yes ?Reason for Co-Treatment: For patient/therapist safety;To address functional/ADL transfers ?PT goals addressed during session: Mobility/safety with mobility ?OT goals addressed during session: ADL's and self-care ?  ? ?  ?AM-PAC OT "6 Clicks" Daily Activity     ?Outcome Measure ? ? Help from another person eating meals?: None ?Help from another person taking care of personal grooming?: A Little ?Help from another person toileting, which includes using toliet, bedpan, or urinal?: A Lot ?Help from another person bathing (including washing, rinsing, drying)?: A Lot ?Help from another person to put on and taking off regular upper body clothing?: A Little ?Help from another person to put on and taking off regular lower body clothing?: A Lot ?6 Click Score: 16 ? ?  ?End of Session Equipment Utilized During Treatment: Rolling walker (2 wheels) ? ?OT Visit Diagnosis: Other abnormalities of gait and mobility (R26.89);Muscle weakness (generalized) (M62.81) ?  ?Activity Tolerance Patient tolerated treatment well;Patient limited by fatigue ?  ?Patient Left in chair;with call bell/phone within reach;with chair alarm set ?  ?Nurse Communication   ?  ? ?   ? ?Time: [id number] ?OT Time Calculation (min): 29 min ? ?Charges: OT General Charges ?$OT Visit: 1 Visit ?OT Treatments ?$Self Care/Home Management : 8-22 mins ? ?Dessie Coma, M.S. OTR/L  ?10/21/21, 12:48 PM  ?ascom [phone] ? ?

## 2021-10-21 NOTE — Progress Notes (Signed)
[name]    DOB: September 14, 1950, 71 y.o.  [id number]    Code Status: Full Code   DOA: 10/18/2021   LOS: 2   Brief hospital course  [name] is a 71 y.o. female with a PMH significant for PAF on Eliquis, CAD, CHF, HTN, hypothyroidism. They presented from home to the ED on 10/18/2021 with mechanical fall day prior to presentation.  She states that she tripped on a rug and her left knee slid across the carpet.  She had a mild head injury without loss of consciousness.  She has history of arthroplasty of the left knee. In the ED, it was found that they had negative head CT. her left knee showed abrasions and blisters.  X-rays of hip, pelvis, knee showed intact arthroplasty and no acute bony abnormalities.  She was notably hypotensive but asymptomatic. They were treated with Zofran, analgesia, fluid bolus.  Patient was admitted to medicine service for further workup and management of knee pain, anemia as outlined in detail below. 3/1- hemoglobin decreased to 6.8 and hypotensive. received 2 units pRBCs 10/21/21 - hgb stable, improved clinically  Assessment & Plan  Principal Problem:   Hypotension Active Problems:   Essential hypertension   Hypothyroidism   Chronic diastolic heart failure (HCC)   Recurrent major depression (HCC)   Paroxysmal atrial fibrillation (HCC)   Class 3 obesity (HCC)   Left knee pain   Acute anemia   Rash   Acute blood loss anemia   Fall  Hypotension-normalized s/p transfusion. Hold IV fluids. -Monitor closely - holding home antihypertensives and can titrate back on PRN  Left thigh hematoma- as seen on [place]. Tenderness has improved.   -Holding Eliquis. Will consider restarting if hgb stable for multiple days.  - monitor closely - PT/OT  Acute blood loss anemia-hemoglobin 12.8 (baseline)>10.5>8.0>6.8>8.6 s/p 2 units pRBCs. No signs of active bleeding today. -CBC a.m.  Rash- located on lateral left knee. Blisters have increased  since photos taken on admission. Tender to touch, negative nikolsky sign. 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 They have coalesced and one was broken while removing adhesive dressing today. No purulence.  Please see clinical images for further detail. - dermatology consulted - starting doxycycline today - monitor closely   Paroxysmal atrial fibrillation ([place])- (present on admission) -Continue flecainide -Hold Eliquis -Hold diltiazem, metoprolol - Monitor on tele   Recurrent major depression ([place])- (present on admission) -Continue Lexapro   Chronic diastolic heart failure ([place])- (present on admission) BP soft -Hold Lasix   Hypothyroidism- (present on admission) TSH normal -Continue levothyroxine  Body mass index is 54.62 kg/m.  VTE ppx: SCDs Start: 10/18/21 2246  Diet:     Diet   Diet regular Room service appropriate? Yes; Fluid consistency: Thin   Subjective 10/21/21    Pt reports feeling overall well today. States that she has more energy since blood transfusion. Does not have pain at rest in knee. She feels up to working with PT today.   Objective   Vitals:   10/20/21 1533 10/20/21 2028 10/21/21 0458 10/21/21 0500  BP: (!) 132/52 (!) 141/45 (!) 152/60   Pulse: 90 94 74   Resp: 16 18 16    Temp: 99.4 F (37.4 C) 98.8 F (37.1 C) 97.7 F (36.5 C)   TempSrc: Oral Oral Oral   SpO2: 97% 96% 100%   Weight:    (!) 158.2 kg    Intake/Output Summary (Last 24 hours) at 10/21/2021 0730 Last data filed at  10/21/2021 0300 Gross per 24 hour  Intake 3109.37 ml  Output 500 ml  Net 2609.37 ml    Filed Weights   10/20/21 0416 10/21/21 0500  Weight: (!) 159.1 kg (!) 158.2 kg     Physical Exam:  General: awake, alert, NAD HEENT: atraumatic, clear conjunctiva, anicteric sclera, MMM, hearing grossly normal Respiratory: normal respiratory effort. Cardiovascular:  quick capillary refill  Nervous: A&O x3. no gross focal neurologic deficits, normal speech Extremities: left knee  positive for ecchymosis and overlying firm blisters up to 6cm in diameter which are tender to palpation and negative nikolsky sign. Unroofed one today while removing an adherent dressing from a very large blister about the size of my hand. The blister bed is vascular, non-blanching. No purulence.  Psychiatry: normal mood, congruent affect    Labs   I have personally reviewed the following labs and imaging studies CBC    Component Value Date/Time   WBC 7.6 10/21/2021 0532   RBC 2.90 (L) 10/21/2021 0532   HGB 8.6 (L) 10/21/2021 0532   HGB 13.9 02/02/2021 1201   HGB 13.9 06/02/2010 1458   HCT 26.7 (L) 10/21/2021 0532   HCT 42.5 02/02/2021 1201   HCT 40.1 06/02/2010 1458   PLT 146 (L) 10/21/2021 0532   PLT 253 02/02/2021 1201   MCV 92.1 10/21/2021 0532   MCV 88 02/02/2021 1201   MCV 85.2 06/02/2010 1458   MCH 29.7 10/21/2021 0532   MCHC 32.2 10/21/2021 0532   RDW 15.3 10/21/2021 0532   RDW 13.6 02/02/2021 1201   RDW 14.7 (H) 06/02/2010 1458   LYMPHSABS 1.8 10/20/2021 1646   LYMPHSABS 1.6 02/02/2021 1201   LYMPHSABS 2.4 06/02/2010 1458   MONOABS 1.0 10/20/2021 1646   MONOABS 0.7 06/02/2010 1458   EOSABS 0.2 10/20/2021 1646   EOSABS 0.3 02/02/2021 1201   BASOSABS 0.1 10/20/2021 1646   BASOSABS 0.1 02/02/2021 1201   BASOSABS 0.1 06/02/2010 1458   BMP Latest Ref Rng & Units 10/20/2021 10/19/2021 10/18/2021  Glucose 70 - 99 mg/dL 124(H) 110(H) 147(H)  BUN 8 - 23 mg/dL 44(H) 42(H) 36(H)  Creatinine 0.44 - 1.00 mg/dL 1.31(H) 1.53(H) 1.16(H)  BUN/Creat Ratio 12 - 28 - - -  Sodium 135 - 145 mmol/L 138 142 139  Potassium 3.5 - 5.1 mmol/L 4.2 4.2 4.8  Chloride 98 - 111 mmol/L 106 111 101  CO2 22 - 32 mmol/L 24 28 26   Calcium 8.9 - 10.3 mg/dL 7.6(L) 7.7(L) 8.7(L)    CT ABDOMEN PELVIS W CONTRAST  Result Date: 10/19/2021 CLINICAL DATA:  Anemia, suspected retroperitoneal bleed/hematoma EXAM: CT ABDOMEN AND PELVIS WITH CONTRAST TECHNIQUE: Multidetector CT imaging of the abdomen and  pelvis was performed using the standard protocol following bolus administration of intravenous contrast. RADIATION DOSE REDUCTION: This exam was performed according to the departmental dose-optimization program which includes automated exposure control, adjustment of the mA and/or kV according to patient size and/or use of iterative reconstruction technique. CONTRAST:  58mL OMNIPAQUE IOHEXOL 350 MG/ML SOLN IV. No oral contrast. COMPARISON:  12/08/2017 FINDINGS: Lower chest: Mild bibasilar atelectasis Hepatobiliary: Gallbladder surgically absent. Liver normal appearance Pancreas: Normal appearance Spleen: Normal appearance Adrenals/Urinary Tract: Adrenal glands, kidneys, ureters, and bladder normal appearance Stomach/Bowel: Normal appendix. Stool in rectum. Scattered colonic diverticulosis without evidence of diverticulitis. Tiny hiatal hernia. Stomach and bowel loops otherwise normal appearance. Vascular/Lymphatic: Atherosclerotic calcification aorta and iliac arteries without aneurysm. No adenopathy. Reproductive: Unremarkable uterus and ovaries Other: No free air or free fluid. No hernia or inflammatory  process. No retroperitoneal hemorrhage or infiltration. Musculoskeletal: Diffuse osseous demineralization. Scattered degenerative disc disease changes of thoracolumbar spine. IMPRESSION: Colonic diverticulosis without evidence of diverticulitis. Tiny hiatal hernia. No acute intra-abdominal or intrapelvic abnormalities. Specifically, no evidence of retroperitoneal hemorrhage. Aortic Atherosclerosis (ICD10-I70.0). Electronically Signed   By: Lavonia Dana M.D.   On: 10/19/2021 11:06   [place] LT LOWER EXTREM LTD SOFT TISSUE NON VASCULAR  Result Date: 10/19/2021 CLINICAL DATA:  Acute blood loss.  Anemia.  Evaluate thigh hematoma. EXAM: ULTRASOUND left LOWER EXTREMITY LIMITED TECHNIQUE: Ultrasound examination of the lower extremity soft tissues was performed in the area of clinical concern. COMPARISON:  None. FINDINGS:  Scanning of the left thigh. There is diffuse soft tissue edema. Anterior to the knee, there is a complex hypoechoic fluid collection measuring 7.6 x 2.2 x 6.6 cm. This most compatible with soft tissue hematoma anterior to the knee. Prior knee replacement noted on x-ray. No hypervascularity on Doppler. IMPRESSION: Complex fluid collection anterior to the knee compatible with soft tissue hematoma. Electronically Signed   By: Franchot Gallo M.D.   On: 10/19/2021 18:31    Disposition Plan & Communication  Patient status: Inpatient  Admitted From: Home Planned disposition location: Skilled nursing facility Anticipated discharge date: 3/6 pending hemoglobin stability and improvement of pain/skin lesion  Family Communication: none    Author: Richarda Osmond, DO [facility] 10/21/2021, 7:30 AM   Available by Epic secure chat 7AM-7PM. If 7PM-7AM, please contact night-coverage.  [facility] contact information found on [email or web address].

## 2021-10-21 NOTE — TOC Progression Note (Signed)
Transition of Care (TOC) - Progression Note  ? ? ?Patient Details  ?Name: [name] ?MRN: [id number] ?Date of Birth: [birth year] ? ?Transition of Care (TOC) CM/SW Contact  ?Caryn Section, RN ?Phone Number: ?10/21/2021, 3:53 PM ? ?Clinical Narrative:   [facility] (patient's preferred SNF) unable to take patient due to staffing.  Patient agreed to local bed search.  Bed search sent locally, awaiting results.  TOC to follow. ? ? ? ?Expected Discharge Plan: Skilled Nursing Facility ?Barriers to Discharge: Continued Medical Work up ? ?Expected Discharge Plan and Services ?Expected Discharge Plan: Skilled Nursing Facility ?  ?Discharge Planning Services: CM Consult ?Post Acute Care Choice: Skilled Nursing Facility ?Living arrangements for the past 2 months: Single Family Home ?                ?  ?  ?  ?  ?  ?  ?  ?  ?  ?  ? ? ?Social Determinants of Health (SDOH) Interventions ?  ? ?Readmission Risk Interventions ?No flowsheet data found. ? ?

## 2021-10-21 NOTE — Progress Notes (Signed)
?   10/21/21 1000  ?Clinical Encounter Type  ?Visited With Patient  ?Visit Type Initial  ?Referral From Nurse  ?Spiritual Encounters  ?Spiritual Needs Prayer;Emotional  ? ?Patient expressed sadness and fear upon receiving some news on her condition. Chaplain provided support through compassionate presence, meaningful conversation, and prayer ?

## 2021-10-21 NOTE — Progress Notes (Signed)
Physical Therapy Treatment ?Patient Details ?Name: [name] ?MRN: [id number] ?DOB: October 17, 1950 ?Today's Date: 10/21/2021 ? ? ?History of Present Illness Pt admitted to [facility] on 10/18/21 under observation for weakness, fall, and L knee/hip pain. Pt noted to have pallor, diaphoresis, and hypotension upon admission. Significant PMH includes: dCHF, obesity, paroxysmal Afib (on Eliquis), GAD, HTN, acquired hypothyroidism. Imaging negative for acute abnormality. ? ?  ?PT Comments  ? ? Pt resting in bed upon PT/OT arrival (PT/OT co-treatment performed) and reporting being on bed pan (but finished).  Min assist logrolling in bed; SBA semi-supine to sitting edge of bed (increased effort/time for pt to perform on own); min to mod assist x2 to stand from mildly elevated bed up to walker; min assist plus SBA of 2nd for equipment stand step turn bed to [facility] with walker use; pt able to perform partial stand from [facility] (UE support on BSC) for peri-care x2 trials; min assist to stand fully up to walker from [facility]; and then CGA (plus 2nd assist for equipment) ambulating 5 feet with walker.  Mild antalgic gait noted d/t L knee pain (pt reporting L knee pain 1-2/10 beginning/end of session).  Will continue to focus on strengthening and progressive functional mobility during hospitalization. ?  ?Recommendations for follow up therapy are one component of a multi-disciplinary discharge planning process, led by the attending physician.  Recommendations may be updated based on patient status, additional functional criteria and insurance authorization. ? ?Follow Up Recommendations ? Skilled nursing-short term rehab (<3 hours/day) ?  ?  ?Assistance Recommended at Discharge Frequent or constant Supervision/Assistance  ?Patient can return home with the following Two people to help with walking and/or transfers;Help with stairs or ramp for entrance;Assist for transportation;A lot of help with bathing/dressing/bathroom;Assistance with  cooking/housework ?  ?Equipment Recommendations ? Rolling walker (2 wheels);BSC/3in1;Wheelchair (measurements PT);Wheelchair cushion (measurements PT) (bariatric)  ?  ?Recommendations for Other Services   ? ? ?  ?Precautions / Restrictions Precautions ?Precautions: Fall ?Restrictions ?Weight Bearing Restrictions: No  ?  ? ?Mobility ? Bed Mobility ?Overal bed mobility: Needs Assistance ?Bed Mobility: Rolling ?Rolling: Min assist ?  ?Supine to sit: Supervision, HOB elevated ?  ?  ?General bed mobility comments: increased effort/time for pt to perform on own and use of bed rails ?  ? ?Transfers ?Overall transfer level: Needs assistance ?Equipment used: Rolling walker (2 wheels) (bariatric) ?Transfers: Sit to/from Stand, Bed to chair/wheelchair/BSC ?Sit to Stand: Min assist, Mod assist, +2 physical assistance ?  ?Step pivot transfers: Min assist, +2 safety/equipment ?  ?  ?  ?General transfer comment: min to mod assist x2 to stand from mildly elevated bed up to RW; min assist plus SBA for equipment stand step turn bed to [facility]; pt able to perform partial stand from [facility] (UE support on BSC) for peri-care x2 trials; min assist to stand fully up to walker ?  ? ?Ambulation/Gait ?Ambulation/Gait assistance: Min guard, +2 safety/equipment ?Gait Distance (Feet): 5 Feet (BSC to recliner) ?Assistive device: Rolling walker (2 wheels) (bariatric) ?  ?Gait velocity: decreased ?  ?  ?General Gait Details: mildly antalgic/decreased stance time L LE; decreased cadence; vc's for walker use and to stay closer to RW ? ? ?Stairs ?  ?  ?  ?  ?  ? ? ?Wheelchair Mobility ?  ? ?Modified Rankin (Stroke Patients Only) ?  ? ? ?  ?Balance Overall balance assessment: Needs assistance ?Sitting-balance support: No upper extremity supported, Feet supported ?Sitting balance-Leahy Scale: Good ?Sitting balance - Comments:  steady sitting reaching within BOS ?  ?Standing balance support: No upper extremity supported ?Standing balance-Leahy Scale:  Good ?Standing balance comment: CGA for safety standing reaching within BOS ?  ?  ?  ?  ?  ?  ?  ?  ?  ?  ?  ?  ? ?  ?Cognition Arousal/Alertness: Awake/alert ?Behavior During Therapy: [facility] for tasks assessed/performed ?Overall Cognitive Status: Within Functional Limits for tasks assessed ?  ?  ?  ?  ?  ?  ?  ?  ?  ?  ?  ?  ?  ?  ?  ?  ?  ?  ?  ? ?  ?Exercises   ? ?  ?General Comments General comments (skin integrity, edema, etc.): dressing noted to L LE ?  ?  ? ?Pertinent Vitals/Pain Pain Assessment ?Pain Assessment: 0-10 ?Pain Score: 2  ?Pain Location: L knee ?Pain Descriptors / Indicators: Sore ?Pain Intervention(s): Limited activity within patient's tolerance, Monitored during session, Premedicated before session, Repositioned ?Vitals (HR and O2 on room air) stable and WFL throughout treatment session.  ? ? ?Home Living   ?  ?  ?  ?  ?  ?  ?  ?  ?  ?   ?  ?Prior Function    ?  ?  ?   ? ?PT Goals (current goals can now be found in the care plan section) Acute Rehab PT Goals ?Patient Stated Goal: to go home, improve L knee pain ?PT Goal Formulation: With patient ?Time For Goal Achievement: 11/02/21 ?Potential to Achieve Goals: Fair ?Progress towards PT goals: Progressing toward goals ? ?  ?Frequency ? ? ? Min 2X/week ? ? ? ?  ?PT Plan Current plan remains appropriate  ? ? ?Co-evaluation PT/OT/SLP Co-Evaluation/Treatment: Yes ?Reason for Co-Treatment: For patient/therapist safety;To address functional/ADL transfers ?PT goals addressed during session: Mobility/safety with mobility ?OT goals addressed during session: ADL's and self-care ?  ? ?  ?AM-PAC PT "6 Clicks" Mobility   ?Outcome Measure ? Help needed turning from your back to your side while in a flat bed without using bedrails?: A Little ?Help needed moving from lying on your back to sitting on the side of a flat bed without using bedrails?: A Little ?Help needed moving to and from a bed to a chair (including a wheelchair)?: A Lot ?Help needed standing up from  a chair using your arms (e.g., wheelchair or bedside chair)?: A Little ?Help needed to walk in hospital room?: A Lot ?Help needed climbing 3-5 steps with a railing? : Total ?6 Click Score: 14 ? ?  ?End of Session Equipment Utilized During Treatment: Gait belt ?Activity Tolerance: Patient tolerated treatment well ?Patient left: in chair;with call bell/phone within reach;with chair alarm set ?Nurse Communication: Mobility status;Precautions ?PT Visit Diagnosis: Other abnormalities of gait and mobility (R26.89);Muscle weakness (generalized) (M62.81);History of falling (Z91.81) ?  ? ? ?Time: 2703-5009 ?PT Time Calculation (min) (ACUTE ONLY): 29 min ? ?Charges:  $Therapeutic Activity: 8-22 mins          ?          ?Hendricks Limes, PT ?10/21/21, 11:38 AM ? ? ?

## 2021-10-22 LAB — TYPE AND SCREEN
ABO/RH(D): B NEG
Antibody Screen: POSITIVE
Donor AG Type: NEGATIVE
Donor AG Type: NEGATIVE
Unit division: 0
Unit division: 0
Unit division: 0
Unit division: 0

## 2021-10-22 LAB — BPAM RBC
Blood Product Expiration Date: 202303132359
Blood Product Expiration Date: 202303152359
Blood Product Expiration Date: 202303152359
Blood Product Expiration Date: 202303152359
ISSUE DATE / TIME: 202303011012
ISSUE DATE / TIME: 202303011350
Unit Type and Rh: 9500
Unit Type and Rh: 9500
Unit Type and Rh: 9500
Unit Type and Rh: 9500

## 2021-10-22 LAB — CBC
HCT: 26.3 % — ABNORMAL LOW (ref 36.0–46.0)
Hemoglobin: 8.3 g/dL — ABNORMAL LOW (ref 12.0–15.0)
MCH: 29.2 pg (ref 26.0–34.0)
MCHC: 31.6 g/dL (ref 30.0–36.0)
MCV: 92.6 fL (ref 80.0–100.0)
Platelets: 168 10*3/uL (ref 150–400)
RBC: 2.84 MIL/uL — ABNORMAL LOW (ref 3.87–5.11)
RDW: 14.9 % (ref 11.5–15.5)
WBC: 8.1 10*3/uL (ref 4.0–10.5)
nRBC: 0 % (ref 0.0–0.2)

## 2021-10-22 LAB — METHYLMALONIC ACID, SERUM: Methylmalonic Acid, Quantitative: 240 nmol/L (ref 0–378)

## 2021-10-22 MED ORDER — LOSARTAN POTASSIUM 50 MG PO TABS
100.0000 mg | ORAL_TABLET | Freq: Every day | ORAL | Status: DC
Start: 2021-10-22 — End: 2021-10-25
  Administered 2021-10-22 – 2021-10-25 (×4): 100 mg via ORAL
  Filled 2021-10-22 (×4): qty 2

## 2021-10-22 MED ORDER — METOPROLOL TARTRATE 50 MG PO TABS
50.0000 mg | ORAL_TABLET | Freq: Two times a day (BID) | ORAL | Status: DC
  Administered 2021-10-22 – 2021-10-25 (×7): 50 mg via ORAL
  Filled 2021-10-22 (×7): qty 1

## 2021-10-22 MED ORDER — FUROSEMIDE 40 MG PO TABS
40.0000 mg | ORAL_TABLET | Freq: Every morning | ORAL | Status: DC
  Administered 2021-10-22 – 2021-10-25 (×4): 40 mg via ORAL
  Filled 2021-10-22 (×4): qty 1

## 2021-10-22 NOTE — TOC Progression Note (Signed)
Transition of Care (TOC) - Progression Note  ? ? ?Patient Details  ?Name: [name] ?MRN: [id number] ?Date of Birth: 25-May-1951 ? ?Transition of Care (TOC) CM/SW Contact  ?Pete Pelt, RN ?Phone Number: ?10/22/2021, 2:59 PM ? ?Clinical Narrative:  SNF auth # [id number] / EMS auth # [id number] from [place] at [place]  ? ? ? ?Expected Discharge Plan: [place] ?Barriers to Discharge: Continued Medical Work up ? ?Expected Discharge Plan and Services ?Expected Discharge Plan: [place] ?  ?Discharge Planning Services: CM Consult ?Post Acute Care Choice: [place] ?Living arrangements for the past 2 months: [place] ?                ?  ?  ?  ?  ?  ?  ?  ?  ?  ?  ? ? ?Social Determinants of Health (SDOH) Interventions ?  ? ?Readmission Risk Interventions ?No flowsheet data found. ? ?

## 2021-10-22 NOTE — Progress Notes (Signed)
Physical Therapy Treatment ?Patient Details ?Name: [name] ?MRN: [id number] ?DOB: August 15, 1951 ?Today's Date: 10/22/2021 ? ? ?History of Present Illness Pt admitted to [facility] on 10/18/21 under observation for weakness, fall, and L knee/hip pain. Pt noted to have pallor, diaphoresis, and hypotension upon admission. Significant PMH includes: dCHF, obesity, paroxysmal Afib (on Eliquis), GAD, HTN, acquired hypothyroidism. Imaging negative for acute abnormality. ? ?  ?PT Comments  ? ? Pt resting in recliner upon PT arrival; agreeable to PT session.   1st 2 trials from recliner, pt unable to stand up to RW with 1 assist and max cueing; 2nd assist obtained and pt able to stand with mod to max assist x1 plus CGA of 2nd; pillows placed on pt's chair to increased height of recliner's sitting surface and pt then able to stand with min assist x1 from recliner (pt also min assist x1 to stand from [facility]).  Able to progress to ambulating 80 feet with walker CGA (antalgic gait noted d/t L knee pain).  4/10 L knee pain at rest beginning of session; pain increased with activity; pain 2/10 at rest end of session (pt received pain meds during session).  Will continue to focus on strengthening and progressive functional mobility during hospitalization. ?  ?Recommendations for follow up therapy are one component of a multi-disciplinary discharge planning process, led by the attending physician.  Recommendations may be updated based on patient status, additional functional criteria and insurance authorization. ? ?Follow Up Recommendations ? Skilled nursing-short term rehab (<3 hours/day) ?  ?  ?Assistance Recommended at Discharge Frequent or constant Supervision/Assistance  ?Patient can return home with the following Help with stairs or ramp for entrance;Assist for transportation;A lot of help with bathing/dressing/bathroom;Assistance with cooking/housework;A lot of help with walking and/or transfers ?  ?Equipment Recommendations ? Rolling  walker (2 wheels);BSC/3in1;Wheelchair (measurements PT);Wheelchair cushion (measurements PT) (bariatric)  ?  ?Recommendations for Other Services   ? ? ?  ?Precautions / Restrictions Precautions ?Precautions: Fall ?Restrictions ?Weight Bearing Restrictions: No  ?  ? ?Mobility ? Bed Mobility ?  ?  ?  ?  ?  ?  ?  ?General bed mobility comments: Deferred (pt in recliner beginning/end of session) ?  ? ?Transfers ?Overall transfer level: Needs assistance ?Equipment used: Rolling walker (2 wheels) ?Transfers: Sit to/from Stand ?Sit to Stand: Mod assist, Min assist ?  ?  ?  ?  ?  ?General transfer comment: 1st 2 trials from recliner unable to stand up to RW with 1 assist and max cueing; 2nd assist obtained and pt able to stand with mod to max assist x1 plus CGA of 2nd; pillows placed on pt's chair to increased height of recliner and pt then able to stand with min assist x1; min assist x1 to stand from [facility] ?  ? ?Ambulation/Gait ?Ambulation/Gait assistance: Min guard ?Gait Distance (Feet): 80 Feet ?Assistive device: Rolling walker (2 wheels) (bariatric) ?  ?Gait velocity: decreased ?  ?  ?General Gait Details: mildly antalgic/decreased stance time L LE; decreased cadence; vc's for walker use and to stay closer to RW ? ? ?Stairs ?  ?  ?  ?  ?  ? ? ?Wheelchair Mobility ?  ? ?Modified Rankin (Stroke Patients Only) ?  ? ? ?  ?Balance Overall balance assessment: Needs assistance ?Sitting-balance support: No upper extremity supported, Feet supported ?Sitting balance-Leahy Scale: Good ?Sitting balance - Comments: steady sitting reaching within BOS ?  ?Standing balance support: Single extremity supported ?Standing balance-Leahy Scale: Fair ?Standing balance comment:  pt requiring at least single UE support while managing underwear for toileting in standing ?  ?  ?  ?  ?  ?  ?  ?  ?  ?  ?  ?  ? ?  ?Cognition Arousal/Alertness: Awake/alert ?Behavior During Therapy: [facility] for tasks assessed/performed ?Overall Cognitive Status: Within  Functional Limits for tasks assessed ?  ?  ?  ?  ?  ?  ?  ?  ?  ?  ?  ?  ?  ?  ?  ?  ?  ?  ?  ? ?  ?Exercises   ? ?  ?General Comments General comments (skin integrity, edema, etc.): blisters noted L LE (one noted to have broken upon entering pt's room--nurse notified).  Nursing cleared pt for participation in physical therapy.  Pt agreeable to PT session. ?  ?  ? ?Pertinent Vitals/Pain Pain Assessment ?Pain Assessment: 0-10 ?Pain Score: 2  ?Pain Location: L knee ?Pain Descriptors / Indicators: Sore ?Pain Intervention(s): Limited activity within patient's tolerance, Monitored during session, Repositioned, Patient requesting pain meds-RN notified, RN gave pain meds during session ?Vitals (HR and O2 on room air) stable and WFL throughout treatment session.  ? ? ?Home Living   ?  ?  ?  ?  ?  ?  ?  ?  ?  ?   ?  ?Prior Function    ?  ?  ?   ? ?PT Goals (current goals can now be found in the care plan section) Acute Rehab PT Goals ?Patient Stated Goal: to go home; improve L knee pain ?PT Goal Formulation: With patient ?Time For Goal Achievement: 11/02/21 ?Potential to Achieve Goals: Good ?Progress towards PT goals: Progressing toward goals ? ?  ?Frequency ? ? ? Min 2X/week ? ? ? ?  ?PT Plan Current plan remains appropriate  ? ? ?Co-evaluation   ?  ?  ?  ?  ? ?  ?AM-PAC PT "6 Clicks" Mobility   ?Outcome Measure ? Help needed turning from your back to your side while in a flat bed without using bedrails?: A Little ?Help needed moving from lying on your back to sitting on the side of a flat bed without using bedrails?: A Little ?Help needed moving to and from a bed to a chair (including a wheelchair)?: A Little ?Help needed standing up from a chair using your arms (e.g., wheelchair or bedside chair)?: A Lot ?Help needed to walk in hospital room?: A Little ?Help needed climbing 3-5 steps with a railing? : Total ?6 Click Score: 15 ? ?  ?End of Session Equipment Utilized During Treatment: Gait belt ?Activity Tolerance: Patient  tolerated treatment well ?Patient left: in chair;with call bell/phone within reach;with chair alarm set ?Nurse Communication: Mobility status;Precautions;Patient requests pain meds ?PT Visit Diagnosis: Other abnormalities of gait and mobility (R26.89);Muscle weakness (generalized) (M62.81);History of falling (Z91.81) ?  ? ? ?Time: 9371-6967 ?PT Time Calculation (min) (ACUTE ONLY): 53 min ? ?Charges:  $Gait Training: 8-22 mins ?$Therapeutic Activity: 38-52 mins          ?          ? ?Hendricks Limes, PT ?10/22/21, 3:27 PM ? ? ?

## 2021-10-22 NOTE — Care Management Important Message (Signed)
Important Message ? ?Patient Details  ?Name: [name] ?MRN: [id number] ?Date of Birth: 1950/09/03 ? ? ?Medicare Important Message Given:  Yes ? ? ? ? ?Olegario Messier A Cristabel Bicknell ?10/22/2021, 11:12 AM ?

## 2021-10-22 NOTE — TOC Progression Note (Signed)
Transition of Care (TOC) - Progression Note  ? ? ?Patient Details  ?Name: [name] ?MRN: [id number] ?Date of Birth: 1950-09-05 ? ?Transition of Care (TOC) CM/SW Contact  ?Pete Pelt, RN ?Phone Number: ?10/22/2021, 11:40 AM ? ?Clinical Narrative:   [facility].  Auth started with [employer].  Anticipated discharge is 06 March ? ? ? ?Expected Discharge Plan: [place] ?Barriers to Discharge: Continued Medical Work up ? ?Expected Discharge Plan and Services ?Expected Discharge Plan: [place] ?  ?Discharge Planning Services: CM Consult ?Post Acute Care Choice: [place] ?Living arrangements for the past 2 months: [place] ?                ?  ?  ?  ?  ?  ?  ?  ?  ?  ?  ? ? ?Social Determinants of Health (SDOH) Interventions ?  ? ?Readmission Risk Interventions ?No flowsheet data found. ? ?

## 2021-10-22 NOTE — Progress Notes (Signed)
?PROGRESS NOTE ? ?[name]    DOB: [birth year], 71 y.o.  ?[id number]  ?  Code Status: Full Code   ?DOA: 10/18/2021   LOS: 3  ? ?Brief hospital course  ?[name] is a 71 y.o. female with a PMH significant for PAF on Eliquis, CAD, CHF, HTN, hypothyroidism. ?They presented from home to the ED on 10/18/2021 with mechanical fall day prior to presentation.  She states that she tripped on a rug and her left knee slid across the carpet.  She had a mild head injury without loss of consciousness.  She has history of arthroplasty of the left knee. ?In the ED, it was found that they had negative head CT. her left knee showed abrasions and blisters.  X-rays of hip, pelvis, knee showed intact arthroplasty and no acute bony abnormalities.  She was notably hypotensive but asymptomatic. ?They were treated with Zofran, analgesia, fluid bolus.  ?Patient was admitted to medicine service for further workup and management of knee pain, anemia as outlined in detail below. ?3/1- hemoglobin decreased to 6.8 and hypotensive. received 2 units pRBCs ? ?10/22/21 - hgb stable, improved clinically ? ?Assessment & Plan  ?Principal Problem: ?  Hypotension ?Active Problems: ?  Essential hypertension ?  Hypothyroidism ?  Chronic diastolic heart failure (HCC) ?  Recurrent major depression (HCC) ?  Paroxysmal atrial fibrillation (HCC) ?  Class 3 obesity (HCC) ?  Left knee pain ?  Acute anemia ?  Rash ?  Acute blood loss anemia ?  Fall ? ?Hypotension-normalized s/p transfusion. Hold IV fluids. ?-Monitor closely ?- restarting home metoprolol, losartan today ? ?Left thigh hematoma- as seen on [place]. Tenderness has worsened today. ?-Holding Eliquis. Will consider restarting if hgb stable for multiple days.  ?- monitor closely ?- PT/OT ? ?Acute blood loss anemia-hemoglobin 12.8 (baseline)>10.5>8.0>6.8>8.6 s/p 2 units pRBCs. No signs of active bleeding today. ?-CBC a.m. ? ?Rash- located on lateral left knee. Blisters have increased since  photos taken on admission. Tender to touch, negative nikolsky sign. 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 No purulence.  Please see clinical images for further detail. ?- dermatology consulted ?- continue doxycycline  ?- monitor closely ?  ?Paroxysmal atrial fibrillation (HCC)- (present on admission) ?-Continue flecainide ?-Hold Eliquis ?- restart metoprolol. Patient reportedly was not taking diltiazem prior to admission ?- Monitor on tele ?  ?Recurrent major depression (HCC)- (present on admission) ?-Continue Lexapro ?  ?Chronic diastolic heart failure (HCC)- hypervolemic on exam ?- restart Lasix ?  ?Hypothyroidism- (present on admission) ?TSH normal ?-Continue levothyroxine ? ?Body mass index is 54.62 kg/m?. ? ?VTE ppx: SCDs Start: 10/18/21 2246 ? ?Diet:  ?   ?Diet  ? Diet regular Room service appropriate? Yes; Fluid consistency: Thin  ? ?Subjective 10/22/21   ? ?Pt reports feeling overall well. She has increased pain in her left knee today.  ?  ?Objective  ? ?Vitals:  ? 10/21/21 0844 10/21/21 1649 10/21/21 2010 10/22/21 0338  ?BP: (!) 151/64 (!) 146/49 (!) 155/64 (!) 157/55  ?Pulse: 75 81 90 91  ?Resp: 20 18 18 18   ?Temp: 97.6 ?F (36.4 ?C) 97.7 ?F (36.5 ?C) 97.6 ?F (36.4 ?C) 98.3 ?F (36.8 ?C)  ?TempSrc:  Oral Oral Oral  ?SpO2: 99% 100% 100% 98%  ?Weight:      ? ? ?Intake/Output Summary (Last 24 hours) at 10/22/2021 0737 ?Last data filed at 10/22/2021 0346 ?12/22/2021  per 24 hour  ?Intake 1352.56 ml  ?Output 502 ml  ?Net 850.56 ml  ? ? ?Filed Weights  ? 10/20/21 0416 10/21/21 0500  ?Weight: (!) 159.1 kg (!) 158.2 kg  ?  ? ?Physical Exam:  ?General: awake, alert, NAD ?HEENT: atraumatic, clear conjunctiva, anicteric sclera, MMM, hearing grossly normal ?Respiratory: normal respiratory effort. ?Cardiovascular:  quick capillary refill  ?Nervous: A&O x3. no gross focal neurologic deficits, normal  speech ?Extremities: left knee positive for ecchymosis and overlying firm blisters up to 6cm in diameter which are tender to palpation and negative nikolsky sign. Large Unroofed one bed is vascular, non-blanching. No purulence.  ?Psychiatry: normal mood, congruent affect ? ? ? ?Labs   ?I have personally reviewed the following labs and imaging studies ?CBC ?   ?Component Value Date/Time  ? WBC 8.1 10/22/2021 0455  ? RBC 2.84 (L) 10/22/2021 0455  ? HGB 8.3 (L) 10/22/2021 0455  ? HGB 13.9 02/02/2021 1201  ? HGB 13.9 06/02/2010 1458  ? HCT 26.3 (L) 10/22/2021 0455  ? HCT 42.5 02/02/2021 1201  ? HCT 40.1 06/02/2010 1458  ? PLT 168 10/22/2021 0455  ? PLT 253 02/02/2021 1201  ? MCV 92.6 10/22/2021 0455  ? MCV 88 02/02/2021 1201  ? MCV 85.2 06/02/2010 1458  ? MCH 29.2 10/22/2021 0455  ? MCHC 31.6 10/22/2021 0455  ? RDW 14.9 10/22/2021 0455  ? RDW 13.6 02/02/2021 1201  ? RDW 14.7 (H) 06/02/2010 1458  ? LYMPHSABS 1.8 10/20/2021 1646  ? LYMPHSABS 1.6 02/02/2021 1201  ? LYMPHSABS 2.4 06/02/2010 1458  ? MONOABS 1.0 10/20/2021 1646  ? MONOABS 0.7 06/02/2010 1458  ? EOSABS 0.2 10/20/2021 1646  ? EOSABS 0.3 02/02/2021 1201  ? BASOSABS 0.1 10/20/2021 1646  ? BASOSABS 0.1 02/02/2021 1201  ? BASOSABS 0.1 06/02/2010 1458  ? ?BMP Latest Ref Rng & Units 10/20/2021 10/19/2021 10/18/2021  ?Glucose 70 - 99 mg/dL 233(A) 076(A) 263(F)  ?BUN 8 - 23 mg/dL 35(K) 56(Y) 56(L)  ?Creatinine 0.44 - 1.00 mg/dL 8.93(T) 3.42(A) 7.68(T)  ?BUN/Creat Ratio 12 - 28 - - -  ?Sodium 135 - 145 mmol/L 138 142 139  ?Potassium 3.5 - 5.1 mmol/L 4.2 4.2 4.8  ?Chloride 98 - 111 mmol/L 106 111 101  ?CO2 22 - 32 mmol/L 24 28 26   ?Calcium 8.9 - 10.3 mg/dL 7.6(L) 7.7(L) 8.7(L)  ? ? ?Disposition Plan & Communication  ?Patient status: Inpatient  ?Admitted From: Home ?Planned disposition location: Skilled nursing facility ?Anticipated discharge date: 3/6 pending hemoglobin stability and improvement of pain/skin lesion ? ?Family Communication: none  ?  ?Author: ? , DO ?[facility] ?10/22/2021, 7:37 AM  ? ?Available by Epic secure chat 7AM-7PM. ?If 7PM-7AM, please contact night-coverage.  ?[facility] contact information found on 12/22/2021. ? ?

## 2021-10-23 LAB — CBC
HCT: 28.2 % — ABNORMAL LOW (ref 36.0–46.0)
Hemoglobin: 9 g/dL — ABNORMAL LOW (ref 12.0–15.0)
MCH: 30.1 pg (ref 26.0–34.0)
MCHC: 31.9 g/dL (ref 30.0–36.0)
MCV: 94.3 fL (ref 80.0–100.0)
Platelets: 198 10*3/uL (ref 150–400)
RBC: 2.99 MIL/uL — ABNORMAL LOW (ref 3.87–5.11)
RDW: 15.2 % (ref 11.5–15.5)
WBC: 9.4 10*3/uL (ref 4.0–10.5)
nRBC: 0 % (ref 0.0–0.2)

## 2021-10-23 LAB — CULTURE, BLOOD (ROUTINE X 2): Special Requests: ADEQUATE

## 2021-10-23 MED ORDER — FUROSEMIDE 10 MG/ML IJ SOLN
40.0000 mg | Freq: Once | INTRAMUSCULAR | Status: AC
  Administered 2021-10-23: 40 mg via INTRAVENOUS
  Filled 2021-10-23: qty 4

## 2021-10-23 NOTE — Progress Notes (Signed)
?PROGRESS NOTE ? ?[name]    DOB: 31-Jan-1951, 71 y.o.  ?[id number]  ?  Code Status: Full Code   ?DOA: 10/18/2021   LOS: 4  ? ?Brief hospital course  ?[name] is a 71 y.o. female with a PMH significant for PAF on Eliquis, CAD, CHF, HTN, hypothyroidism. ?They presented from home to the ED on 10/18/2021 with mechanical fall day prior to presentation.  She states that she tripped on a rug and her left knee slid across the carpet.  She had a mild head injury without loss of consciousness.  She has history of arthroplasty of the left knee. ?In the ED, it was found that they had negative head CT. her left knee showed abrasions and blisters.  X-rays of hip, pelvis, knee showed intact arthroplasty and no acute bony abnormalities.  She was notably hypotensive but asymptomatic. ?They were treated with Zofran, analgesia, fluid bolus.  ?Patient was admitted to medicine service for further workup and management of knee pain, anemia as outlined in detail below. ?3/1- hemoglobin decreased to 6.8 and hypotensive. received 2 units pRBCs ? ?10/23/21 - hgb stable, improved clinically ? ?Assessment & Plan  ?Principal Problem: ?  Hypotension ?Active Problems: ?  Essential hypertension ?  Hypothyroidism ?  Chronic diastolic heart failure (HCC) ?  Recurrent major depression (HCC) ?  Paroxysmal atrial fibrillation (HCC) ?  Class 3 obesity (HCC) ?  Left knee pain ?  Acute anemia ?  Rash ?  Acute blood loss anemia ?  Fall ? ?Hypotension-normalized s/p transfusion. Hold IV fluids. ?-Monitor closely ?- restarted home metoprolol, losartan ? ?Left thigh hematoma- as seen on [place]. Tenderness stable ?-Holding Eliquis. Will consider restarting if hgb stable for multiple days.  ?- monitor closely ?- PT/OT ? ?Acute blood loss anemia-hemoglobin 12.8 (baseline)>10.5>8.0>6.8>8.6>9.0 s/p 2 units pRBCs. No signs of active bleeding ?-CBC a.m. ? ?Rash- located on lateral left knee. Blisters have increased since photos taken on admission.  Tender to touch, negative nikolsky sign. 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 No purulence.  Please see clinical images for further detail. Wound appears to be healing  ?- dermatology consulted ?- continue doxycycline  ?- monitor closely ?- WOC ?  ?Paroxysmal atrial fibrillation (HCC)- (present on admission) ?-Continue flecainide ?-Hold Eliquis ?- restart metoprolol. Patient reportedly was not taking diltiazem prior to admission ?- Monitor on tele ?  ?Recurrent major depression (HCC)- (present on admission) ?-Continue Lexapro ?  ?Chronic diastolic heart failure (HCC)- hypervolemic on exam. Improved from yesterday ?- restart Lasix ?  ?Hypothyroidism- (present on admission) ?TSH normal ?-Continue levothyroxine ? ?Body mass index is 57.04 kg/m?. ? ?VTE ppx: SCDs Start: 10/18/21 2246 ? ?Diet:  ?   ?Diet  ? Diet regular Room service appropriate? Yes; Fluid consistency: Thin  ? ?Subjective 10/23/21   ? ?Pt reports feeling overall well. She denies complaints or concerns. ?  ?Objective  ? ?Vitals:  ? 10/22/21 1722 10/22/21 2005 10/23/21 0413 10/23/21 0500  ?BP: (!) 118/49 (!) 147/54 (!) 108/46   ?Pulse: 74 70 (!) 58   ?Resp: 18 18 18    ?Temp: 97.8 ?F (36.6 ?C) 98.4 ?F (36.9 ?C) 98.4 ?F (36.9 ?C)   ?TempSrc: Oral Oral Oral   ?SpO2: 98% 99% 99%   ?Weight:    (!) 165.2 kg  ? ? ?Intake/Output Summary (Last 24 hours) at 10/23/2021 0816 ?Last data filed at 10/23/2021 0413 ?Gross per 24 hour  ?Intake 600 ml  ?Output 700 ml  ?Net -100 ml  ? ? ?  Filed Weights  ? 10/20/21 0416 10/21/21 0500 10/23/21 0500  ?Weight: (!) 159.1 kg (!) 158.2 kg (!) 165.2 kg  ?  ? ?Physical Exam:  ?General: awake, alert, NAD ?HEENT: atraumatic, clear conjunctiva, anicteric sclera, MMM, hearing grossly normal ?Respiratory: normal respiratory effort. ?Cardiovascular:  quick capillary refill  ?Nervous: A&O x3. no gross focal neurologic deficits, normal speech ?Extremities: left knee positive for ecchymosis and overlying firm blisters up to 6cm in diameter which are  tender to palpation and negative nikolsky sign. Large Unroofed one bed is vascular, non-blanching. No purulence.  ?Psychiatry: normal mood, congruent affect ? ? ? ? ?Labs   ?I have personally reviewed the following labs and imaging studies ?CBC ?   ?Component Value Date/Time  ? WBC 9.4 10/23/2021 0513  ? RBC 2.99 (L) 10/23/2021 0513  ? HGB 9.0 (L) 10/23/2021 0513  ? HGB 13.9 02/02/2021 1201  ? HGB 13.9 06/02/2010 1458  ? HCT 28.2 (L) 10/23/2021 0513  ? HCT 42.5 02/02/2021 1201  ? HCT 40.1 06/02/2010 1458  ? PLT 198 10/23/2021 0513  ? PLT 253 02/02/2021 1201  ? MCV 94.3 10/23/2021 0513  ? MCV 88 02/02/2021 1201  ? MCV 85.2 06/02/2010 1458  ? MCH 30.1 10/23/2021 0513  ? MCHC 31.9 10/23/2021 0513  ? RDW 15.2 10/23/2021 0513  ? RDW 13.6 02/02/2021 1201  ? RDW 14.7 (H) 06/02/2010 1458  ? LYMPHSABS 1.8 10/20/2021 1646  ? LYMPHSABS 1.6 02/02/2021 1201  ? LYMPHSABS 2.4 06/02/2010 1458  ? MONOABS 1.0 10/20/2021 1646  ? MONOABS 0.7 06/02/2010 1458  ? EOSABS 0.2 10/20/2021 1646  ? EOSABS 0.3 02/02/2021 1201  ? BASOSABS 0.1 10/20/2021 1646  ? BASOSABS 0.1 02/02/2021 1201  ? BASOSABS 0.1 06/02/2010 1458  ? ?BMP Latest Ref Rng & Units 10/20/2021 10/19/2021 10/18/2021  ?Glucose 70 - 99 mg/dL 300(P) 233(A) 076(A)  ?BUN 8 - 23 mg/dL 26(J) 33(L) 45(G)  ?Creatinine 0.44 - 1.00 mg/dL 2.56(L) 8.93(T) 3.42(A)  ?BUN/Creat Ratio 12 - 28 - - -  ?Sodium 135 - 145 mmol/L 138 142 139  ?Potassium 3.5 - 5.1 mmol/L 4.2 4.2 4.8  ?Chloride 98 - 111 mmol/L 106 111 101  ?CO2 22 - 32 mmol/L 24 28 26   ?Calcium 8.9 - 10.3 mg/dL 7.6(L) 7.7(L) 8.7(L)  ? ? ?Disposition Plan & Communication  ?Patient status: Inpatient  ?Admitted From: Home ?Planned disposition location: Skilled nursing facility ?Anticipated discharge date: 3/6 pending hemoglobin stability and improvement of pain/skin lesion ? ?Family Communication: none  ?  ?Author: ? , DO ?[facility] ?10/23/2021, 8:16 AM  ? ?Available by Epic secure chat 7AM-7PM. ?If 7PM-7AM, please  contact night-coverage.  ?[facility] contact information found on 12/23/2021. ? ?

## 2021-10-24 LAB — CBC
HCT: 26.6 % — ABNORMAL LOW (ref 36.0–46.0)
Hemoglobin: 8.4 g/dL — ABNORMAL LOW (ref 12.0–15.0)
MCH: 29.4 pg (ref 26.0–34.0)
MCHC: 31.6 g/dL (ref 30.0–36.0)
MCV: 93 fL (ref 80.0–100.0)
Platelets: 259 10*3/uL (ref 150–400)
RBC: 2.86 MIL/uL — ABNORMAL LOW (ref 3.87–5.11)
RDW: 15.4 % (ref 11.5–15.5)
WBC: 9.4 10*3/uL (ref 4.0–10.5)
nRBC: 0 % (ref 0.0–0.2)

## 2021-10-24 LAB — CULTURE, BLOOD (ROUTINE X 2)
Culture: NO GROWTH
Special Requests: ADEQUATE

## 2021-10-24 LAB — SARS CORONAVIRUS 2 (TAT 6-24 HRS): SARS Coronavirus 2: NEGATIVE

## 2021-10-24 NOTE — Progress Notes (Signed)
?PROGRESS NOTE ? ?[name]    DOB: [birth year], 71 y.o.  ?CBJ:[id number]  ?  Code Status: Full Code   ?DOA: 10/18/2021   LOS: 5  ? ?Brief hospital course  ?[name] is a 71 y.o. female with a PMH significant for PAF on Eliquis, CAD, CHF, HTN, hypothyroidism. ?They presented from home to the ED on 10/18/2021 with mechanical fall day prior to presentation.  She states that she tripped on a rug and her left knee slid across the carpet.  She had a mild head injury without loss of consciousness.  She has history of arthroplasty of the left knee. ?In the ED, it was found that they had negative head CT. her left knee showed abrasions and blisters.  X-rays of hip, pelvis, knee showed intact arthroplasty and no acute bony abnormalities.  She was notably hypotensive but asymptomatic. ?They were treated with Zofran, analgesia, fluid bolus.  ?Patient was admitted to medicine service for further workup and management of knee pain, anemia as outlined in detail below. ?3/1- hemoglobin decreased to 6.8 and hypotensive. received 2 units pRBCs ? ?10/24/21 - hgb stable, improved clinically ? ?Assessment & Plan  ?Principal Problem: ?  Hypotension ?Active Problems: ?  Essential hypertension ?  Hypothyroidism ?  Chronic diastolic heart failure (HCC) ?  Recurrent major depression (HCC) ?  Paroxysmal atrial fibrillation (HCC) ?  Class 3 obesity (HCC) ?  Left knee pain ?  Acute anemia ?  Rash ?  Acute blood loss anemia ?  Fall ? ?Hypotension-normalized s/p transfusion. ?-Monitor closely ?- restarted home metoprolol, losartan ? ?Left thigh hematoma- as seen on [place]. Tenderness stable ?-Holding Eliquis. Can restart prior to dc ?- CBC am ?- monitor closely ?- PT/OT ? ?Acute blood loss anemia-hemoglobin 12.8 (baseline)>10.5>8.0>6.8>8.6>9.0>8.4 s/p 2 units pRBCs. No signs of active bleeding ?-CBC a.m. ? ?Rash- located on lateral left knee.Tender to touch, negative nikolsky sign. 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 No purulence.   Please see clinical images for further detail. Wound appears to be healing in normal progression. Swelling has improved ?- dermatology consulted ?- continue doxycycline  ?- monitor closely ?- WOC ?  ?Paroxysmal atrial fibrillation (HCC)- (present on admission) ?-Continue flecainide ?-Hold Eliquis ?- restart metoprolol. Patient reportedly was not taking diltiazem prior to admission ?- Monitor on tele ?  ?Recurrent major depression (HCC)- (present on admission) ?-Continue Lexapro ?  ?Chronic diastolic heart failure (HCC)- hypervolemic on exam. Improved from yesterday ?- restarted Lasix ?  ?Hypothyroidism- (present on admission) ?TSH normal ?-Continue levothyroxine ? ?Body mass index is 56.97 kg/m?. ? ?VTE ppx: SCDs Start: 10/18/21 2246 ? ?Diet:  ?   ?Diet  ? Diet regular Room service appropriate? Yes; Fluid consistency: Thin  ? ?Subjective 10/24/21   ? ?Pt reports doing well today. She was up and more mobile yesterday. Pain and stiffness of left knee limiting mobility still.  ?  ?Objective  ? ?Vitals:  ? 10/23/21 1952 10/23/21 2212 10/24/21 0341 10/24/21 0500  ?BP: (!) 134/49 (!) 115/51 (!) 118/57   ?Pulse: 66 70 (!) 57   ?Resp: 18 18 19    ?Temp: 98.2 ?F (36.8 ?C) 98.5 ?F (36.9 ?C) 98.2 ?F (36.8 ?C)   ?TempSrc: Oral Oral    ?SpO2: 100% 97% 97%   ?Weight:    (!) 165 kg  ? ? ?Intake/Output Summary (Last 24 hours) at 10/24/2021 0725 ?Last data filed at 10/24/2021 0500 ?Gross per 24 hour  ?Intake --  ?Output 2250 ml  ?Net -2250 ml  ? ? ?  Filed Weights  ? 10/21/21 0500 10/23/21 0500 10/24/21 0500  ?Weight: (!) 158.2 kg (!) 165.2 kg (!) 165 kg  ?  ? ?Physical Exam:  ?General: awake, alert, NAD ?HEENT: atraumatic, clear conjunctiva, anicteric sclera, MMM, hearing grossly normal ?Respiratory: normal respiratory effort. ?Cardiovascular:  quick capillary refill  ?Nervous: A&O x3. no gross focal neurologic deficits, normal speech ?Extremities: left knee positive for ecchymosis and overlying firm blisters up to 4cm in diameter which  are tender to palpation and negative nikolsky sign. Large Unroofed one bed is vascular, non-blanching. No purulence.  ?Psychiatry: normal mood, congruent affect ? ? ? ?Labs   ?I have personally reviewed the following labs and imaging studies ?CBC ?   ?Component Value Date/Time  ? WBC 9.4 10/24/2021 0622  ? RBC 2.86 (L) 10/24/2021 0622  ? HGB 8.4 (L) 10/24/2021 0622  ? HGB 13.9 02/02/2021 1201  ? HGB 13.9 06/02/2010 1458  ? HCT 26.6 (L) 10/24/2021 0622  ? HCT 42.5 02/02/2021 1201  ? HCT 40.1 06/02/2010 1458  ? PLT 259 10/24/2021 0622  ? PLT 253 02/02/2021 1201  ? MCV 93.0 10/24/2021 0622  ? MCV 88 02/02/2021 1201  ? MCV 85.2 06/02/2010 1458  ? MCH 29.4 10/24/2021 0622  ? MCHC 31.6 10/24/2021 0622  ? RDW 15.4 10/24/2021 0622  ? RDW 13.6 02/02/2021 1201  ? RDW 14.7 (H) 06/02/2010 1458  ? LYMPHSABS 1.8 10/20/2021 1646  ? LYMPHSABS 1.6 02/02/2021 1201  ? LYMPHSABS 2.4 06/02/2010 1458  ? MONOABS 1.0 10/20/2021 1646  ? MONOABS 0.7 06/02/2010 1458  ? EOSABS 0.2 10/20/2021 1646  ? EOSABS 0.3 02/02/2021 1201  ? BASOSABS 0.1 10/20/2021 1646  ? BASOSABS 0.1 02/02/2021 1201  ? BASOSABS 0.1 06/02/2010 1458  ? ?BMP Latest Ref Rng & Units 10/20/2021 10/19/2021 10/18/2021  ?Glucose 70 - 99 mg/dL 465(K) 812(X) 517(G)  ?BUN 8 - 23 mg/dL 01(V) 49(S) 49(Q)  ?Creatinine 0.44 - 1.00 mg/dL 7.59(F) 6.38(G) 6.65(L)  ?BUN/Creat Ratio 12 - 28 - - -  ?Sodium 135 - 145 mmol/L 138 142 139  ?Potassium 3.5 - 5.1 mmol/L 4.2 4.2 4.8  ?Chloride 98 - 111 mmol/L 106 111 101  ?CO2 22 - 32 mmol/L 24 28 26   ?Calcium 8.9 - 10.3 mg/dL 7.6(L) 7.7(L) 8.7(L)  ? ? ?Disposition Plan & Communication  ?Patient status: Inpatient  ?Admitted From: Home ?Planned disposition location: Skilled nursing facility ?Anticipated discharge date: 3/6 pending hemoglobin stability and improvement of pain/skin lesion ? ?Family Communication: son at bedside yesterday  ?  ?Author: ? , DO ?[facility] ?10/24/2021, 7:25 AM  ? ?Available by Epic secure chat  7AM-7PM. ?If 7PM-7AM, please contact night-coverage.  ?[facility] contact information found on 12/24/2021. ? ?

## 2021-10-25 DIAGNOSIS — Z96651 Presence of right artificial knee joint: Secondary | ICD-10-CM | POA: Diagnosis not present

## 2021-10-25 DIAGNOSIS — R7982 Elevated C-reactive protein (CRP): Secondary | ICD-10-CM | POA: Diagnosis not present

## 2021-10-25 DIAGNOSIS — Z87891 Personal history of nicotine dependence: Secondary | ICD-10-CM | POA: Diagnosis not present

## 2021-10-25 DIAGNOSIS — I5032 Chronic diastolic (congestive) heart failure: Secondary | ICD-10-CM | POA: Diagnosis not present

## 2021-10-25 DIAGNOSIS — W1800XA Striking against unspecified object with subsequent fall, initial encounter: Secondary | ICD-10-CM | POA: Diagnosis not present

## 2021-10-25 DIAGNOSIS — F325 Major depressive disorder, single episode, in full remission: Secondary | ICD-10-CM | POA: Diagnosis not present

## 2021-10-25 DIAGNOSIS — K219 Gastro-esophageal reflux disease without esophagitis: Secondary | ICD-10-CM | POA: Diagnosis not present

## 2021-10-25 DIAGNOSIS — F330 Major depressive disorder, recurrent, mild: Secondary | ICD-10-CM | POA: Diagnosis not present

## 2021-10-25 DIAGNOSIS — Z7901 Long term (current) use of anticoagulants: Secondary | ICD-10-CM | POA: Diagnosis not present

## 2021-10-25 DIAGNOSIS — E038 Other specified hypothyroidism: Secondary | ICD-10-CM | POA: Diagnosis not present

## 2021-10-25 DIAGNOSIS — I9589 Other hypotension: Secondary | ICD-10-CM | POA: Diagnosis not present

## 2021-10-25 DIAGNOSIS — D649 Anemia, unspecified: Secondary | ICD-10-CM | POA: Diagnosis not present

## 2021-10-25 DIAGNOSIS — L97922 Non-pressure chronic ulcer of unspecified part of left lower leg with fat layer exposed: Secondary | ICD-10-CM | POA: Diagnosis not present

## 2021-10-25 DIAGNOSIS — Z96652 Presence of left artificial knee joint: Secondary | ICD-10-CM | POA: Diagnosis not present

## 2021-10-25 DIAGNOSIS — Z8616 Personal history of COVID-19: Secondary | ICD-10-CM | POA: Diagnosis not present

## 2021-10-25 DIAGNOSIS — I2510 Atherosclerotic heart disease of native coronary artery without angina pectoris: Secondary | ICD-10-CM | POA: Diagnosis not present

## 2021-10-25 DIAGNOSIS — E559 Vitamin D deficiency, unspecified: Secondary | ICD-10-CM | POA: Diagnosis not present

## 2021-10-25 DIAGNOSIS — Z7401 Bed confinement status: Secondary | ICD-10-CM | POA: Diagnosis not present

## 2021-10-25 DIAGNOSIS — F419 Anxiety disorder, unspecified: Secondary | ICD-10-CM | POA: Diagnosis not present

## 2021-10-25 DIAGNOSIS — F339 Major depressive disorder, recurrent, unspecified: Secondary | ICD-10-CM | POA: Diagnosis not present

## 2021-10-25 DIAGNOSIS — D62 Acute posthemorrhagic anemia: Secondary | ICD-10-CM | POA: Diagnosis not present

## 2021-10-25 DIAGNOSIS — I130 Hypertensive heart and chronic kidney disease with heart failure and stage 1 through stage 4 chronic kidney disease, or unspecified chronic kidney disease: Secondary | ICD-10-CM | POA: Diagnosis not present

## 2021-10-25 DIAGNOSIS — I480 Paroxysmal atrial fibrillation: Secondary | ICD-10-CM | POA: Diagnosis not present

## 2021-10-25 DIAGNOSIS — N189 Chronic kidney disease, unspecified: Secondary | ICD-10-CM | POA: Diagnosis not present

## 2021-10-25 DIAGNOSIS — I10 Essential (primary) hypertension: Secondary | ICD-10-CM | POA: Diagnosis not present

## 2021-10-25 DIAGNOSIS — E785 Hyperlipidemia, unspecified: Secondary | ICD-10-CM | POA: Diagnosis not present

## 2021-10-25 DIAGNOSIS — I959 Hypotension, unspecified: Secondary | ICD-10-CM | POA: Diagnosis not present

## 2021-10-25 DIAGNOSIS — E861 Hypovolemia: Secondary | ICD-10-CM | POA: Diagnosis not present

## 2021-10-25 DIAGNOSIS — E039 Hypothyroidism, unspecified: Secondary | ICD-10-CM | POA: Diagnosis not present

## 2021-10-25 DIAGNOSIS — M25562 Pain in left knee: Secondary | ICD-10-CM | POA: Diagnosis not present

## 2021-10-25 DIAGNOSIS — R21 Rash and other nonspecific skin eruption: Secondary | ICD-10-CM | POA: Diagnosis not present

## 2021-10-25 DIAGNOSIS — W19XXXA Unspecified fall, initial encounter: Secondary | ICD-10-CM | POA: Diagnosis not present

## 2021-10-25 DIAGNOSIS — I4891 Unspecified atrial fibrillation: Secondary | ICD-10-CM | POA: Diagnosis not present

## 2021-10-25 LAB — BASIC METABOLIC PANEL
Anion gap: 6 (ref 5–15)
BUN: 36 mg/dL — ABNORMAL HIGH (ref 8–23)
CO2: 25 mmol/L (ref 22–32)
Calcium: 8.4 mg/dL — ABNORMAL LOW (ref 8.9–10.3)
Chloride: 109 mmol/L (ref 98–111)
Creatinine, Ser: 0.9 mg/dL (ref 0.44–1.00)
GFR, Estimated: >60 mL/min (ref >60)
Glucose, Bld: 99 mg/dL (ref 70–99)
Potassium: 4 mmol/L (ref 3.5–5.1)
Sodium: 140 mmol/L (ref 135–145)

## 2021-10-25 LAB — CBC
HCT: 26.1 % — ABNORMAL LOW (ref 36.0–46.0)
Hemoglobin: 8.4 g/dL — ABNORMAL LOW (ref 12.0–15.0)
MCH: 29.9 pg (ref 26.0–34.0)
MCHC: 32.2 g/dL (ref 30.0–36.0)
MCV: 92.9 fL (ref 80.0–100.0)
Platelets: 247 10*3/uL (ref 150–400)
RBC: 2.81 MIL/uL — ABNORMAL LOW (ref 3.87–5.11)
RDW: 15 % (ref 11.5–15.5)
WBC: 9.1 10*3/uL (ref 4.0–10.5)
nRBC: 0 % (ref 0.0–0.2)

## 2021-10-25 MED ORDER — COLLAGENASE 250 UNIT/GM EX OINT: TOPICAL_OINTMENT | Freq: Every day | CUTANEOUS | 0 refills | Status: DC

## 2021-10-25 MED ORDER — DOXYCYCLINE HYCLATE 100 MG PO TABS
100.0000 mg | ORAL_TABLET | Freq: Two times a day (BID) | ORAL | 0 refills | Status: AC
Start: 2021-10-25 — End: 2021-11-01

## 2021-10-25 MED ORDER — APIXABAN 5 MG PO TABS
5.0000 mg | ORAL_TABLET | Freq: Two times a day (BID) | ORAL | Status: DC
  Administered 2021-10-25: 5 mg via ORAL
  Filled 2021-10-25: qty 1

## 2021-10-25 MED ORDER — OXYCODONE HCL 5 MG PO TABS: 2.5000 mg | ORAL_TABLET | Freq: Two times a day (BID) | ORAL | 0 refills | Status: AC | PRN

## 2021-10-25 MED ORDER — COLLAGENASE 250 UNIT/GM EX OINT
TOPICAL_OINTMENT | Freq: Every day | CUTANEOUS | Status: DC
  Filled 2021-10-25: qty 30

## 2021-10-25 NOTE — TOC Progression Note (Signed)
Transition of Care (TOC) - Progression Note  ? ? ?Patient Details  ?Name: [name] ?MRN: [id number] ?Date of Birth: 1951-01-03 ? ?Transition of Care (TOC) CM/SW Contact  ?Caryn Section, RN ?Phone Number: ?10/25/2021, 1:34 PM ? ?Clinical Narrative:   Patient is transferring to [facility] today, room 403 per leslie.  EMS states patient is second on list to transport.  Patient is aware of her transfer, and states she has called her family. ? ? ? ?Expected Discharge Plan: Skilled Nursing Facility ?Barriers to Discharge: Continued Medical Work up ? ?Expected Discharge Plan and Services ?Expected Discharge Plan: Skilled Nursing Facility ?  ?Discharge Planning Services: CM Consult ?Post Acute Care Choice: Skilled Nursing Facility ?Living arrangements for the past 2 months: Single Family Home ?Expected Discharge Date: 10/25/21               ?  ?  ?  ?  ?  ?  ?  ?  ?  ?  ? ? ?Social Determinants of Health (SDOH) Interventions ?  ? ?Readmission Risk Interventions ?No flowsheet data found. ? ?

## 2021-10-25 NOTE — Consult Note (Signed)
WOC Nurse Consult Note: ?Reason for Consult: LE blistering ?Reviewed images and it is noted wounds started in 2021; pretibial LLE  ?Wound type:unclear etiology; however she does have hemosiderin staining consistent with venous disease. No ABI's in chart for review ?Pressure Injury POA: NA ?Measurement: areas on the LLE intact serous filled blistering; open wound LLE lateral calf; punched out  ?Wound bed: serous filled blisters, partial thickness skin loss over calf; open wound with 75% yellow/brown slough  ?Drainage (amount, consistency, odor) moderate ?Periwound: edema and discoloration ?Dressing procedure/placement/frequency: ?Will order conservative topical care for the blistering ?Add enzymatic debridement for the open wound LLE lateral calf ?Add kerlix and ACE for minimal compression; would need ABIs to determine safety of therapeutic compression ? ?Recommend follow up in a wound care center of the patient's choice for long term management of LE wounds and compression therapy.  ? ?Re consult if needed, will not follow at this time. ?Thanks ? Halton [facility] [employer] MSN, RN,CWOCN, CNS, CWON-AP [phone])  ? ? ?  ?

## 2021-10-25 NOTE — Discharge Summary (Signed)
Physician Discharge Summary  Patient: [name] GUR:[id number] DOB: November 23, 1950   Code Status: Full Code Admit date: 10/18/2021 Discharge date: 10/25/2021 Disposition: Skilled nursing facility,  PCP: Christine Brunette, MD  Recommendations for Outpatient Follow-up:  Follow up with PCP within 1-2 weeks Please follow CBC to monitor hgb stability. Received 2 units pRBCs while inpatient and then hgb stabilized. 8.4 on day of discharge and restarted eliquis Follow up with wound care clinic- referral placed at dc Bilateral chronic venous stasis Wound at injury site will need debridement, please see clinical images for further detail  Discharge Diagnoses:  Principal Problem:   Hypotension Active Problems:   Essential hypertension   Hypothyroidism   Chronic diastolic heart failure (HCC)   Recurrent major depression (HCC)   Paroxysmal atrial fibrillation (HCC)   Class 3 obesity (HCC)   Left knee pain   Acute anemia   Rash   Acute blood loss anemia   [facility] Course Summary: [name] is a 70 y.o. female with a PMH significant for PAF on Eliquis, CAD, CHF, HTN, hypothyroidism. They presented from home to the ED on 10/18/2021 with mechanical fall day prior to presentation.  She states that she tripped on a rug and her left knee slid across the carpet.  She had a mild head injury without loss of consciousness.  She has history of arthroplasty of the left knee. In the ED, it was found that they had negative head CT. her left knee showed abrasions and blisters.  X-rays of hip, pelvis, knee showed intact arthroplasty and no acute bony abnormalities.  She was notably hypotensive but asymptomatic. They were treated with Zofran, analgesia, fluid bolus.  Patient was admitted to medicine service for further workup and management of knee pain. 2/28- with worsening knee pain and anemia, [place] of knee was obtained and showed significant hematoma. No bleeding was observed on CT  abdomen/pelvis 3/1- hemoglobin decreased to 6.8 and hypotensive. received 2 units pRBCs 3/2- blood pressure and hgb improved, remained stable throughout admission. Started on doxycycline ppx for open wound on left knee  All other chronic conditions were treated with home medications.    Discharge Condition: Good, improved Recommended discharge diet: Regular healthy diet  Consultations: WOC  Procedures/Studies: LLE dopple 2 units pRBCs transfusion  Discharge Instructions     Ambulatory referral to Wound Clinic   Complete by: As directed       Allergies as of 10/25/2021       Reactions   Augmentin [amoxicillin-pot Clavulanate] Hives, Itching   Has patient had a PCN reaction causing immediate rash, facial/tongue/throat swelling, SOB or lightheadedness with hypotension:No Has patient had a PCN reaction causing severe rash involving mucus membranes or skin necrosis:No Has patient had a PCN reaction that required hospitalization:No Has patient had a PCN reaction occurring within the last 10 years:No If all of the above answers are "NO", then may proceed with Cephalosporin use.   Pneumococcal Vaccines Other (See Comments)   Caused fever, and swelling at injection site        Medication List     STOP taking these medications    ascorbic acid 500 MG tablet Commonly known as: VITAMIN C   diltiazem 30 MG tablet Commonly known as: Cardizem   Vitamin D (Cholecalciferol) 25 MCG (1000 UT) Tabs   zinc sulfate 220 (50 Zn) MG capsule       TAKE these medications    acetaminophen 500 MG tablet Commonly known as: TYLENOL  Take 1,000 mg by mouth at bedtime.   collagenase 250 UNIT/GM ointment Commonly known as: SANTYL Apply topically daily.   doxycycline 100 MG tablet Commonly known as: VIBRA-TABS Take 1 tablet (100 mg total) by mouth every 12 (twelve) hours for 7 days.   Eliquis 5 MG Tabs tablet Generic drug: apixaban TAKE 1 TABLET BY MOUTH 2 TIMES DAILY.    escitalopram 5 MG tablet Commonly known as: LEXAPRO Take 5 mg by mouth daily.   flecainide 100 MG tablet Commonly known as: TAMBOCOR TAKE 1 TABLET BY MOUTH 2 TIMES A DAY   furosemide 20 MG tablet Commonly known as: LASIX Take 40 mg by mouth every morning.   levothyroxine 75 MCG tablet Commonly known as: SYNTHROID Take 75 mcg by mouth daily before breakfast.   losartan 100 MG tablet Commonly known as: COZAAR Take 1 tablet (100 mg total) by mouth daily.   metoprolol tartrate 50 MG tablet Commonly known as: LOPRESSOR Take 1 tablet (50 mg total) by mouth 2 (two) times daily.   oxyCODONE 5 MG immediate release tablet Commonly known as: Oxy IR/ROXICODONE Take 0.5 tablets (2.5 mg total) by mouth 2 (two) times daily as needed for up to 5 days for breakthrough pain.        Contact information for after-discharge care     [facility] .   Service: Skilled Nursing Contact information: [place] [phone]                     Subjective   Pt reports feeling improvement in her pain and mobility. She is concerned about her recovery and feels depressed about having to go to a nursing home. Her questions and concerns were addressed at time of encounter.  Objective  Blood pressure (!) 127/41, pulse (!) 57, temperature 97.8 F (36.6 C), temperature source Oral, resp. rate 16, weight (!) 165 kg, SpO2 95 %.   General: Pt is alert, awake, not in acute distress Cardiovascular: RRR, S1/S2 +, no rubs, no gallops Respiratory: CTA bilaterally, no wheezing, no rhonchi Abdominal: Soft, NT, ND, bowel sounds + Extremities: left lateral knee with abrasion/blisters, significant ecchymosis and swelling related to joint. Decreased ROM. Improved from yesterday. Please see clinical images fur further detail and to monitor progression.   Stable chronic venous stasis bilaterally   The results of significant diagnostics from this hospitalization (including imaging, microbiology, ancillary and laboratory) are listed below for reference.   Imaging studies: CT HEAD WO CONTRAST (5MM)  Result Date: 10/18/2021 CLINICAL DATA:  Head trauma.  Weakness with a fall yesterday. EXAM: CT HEAD WITHOUT CONTRAST CT CERVICAL SPINE WITHOUT CONTRAST TECHNIQUE: Multidetector CT imaging of the head and cervical spine was performed following the standard protocol without intravenous contrast. Multiplanar CT image reconstructions of the cervical spine were also generated. RADIATION DOSE REDUCTION: This exam was performed according to the departmental dose-optimization program which includes automated exposure control, adjustment of the mA and/or kV according to patient size and/or use of iterative reconstruction technique. COMPARISON:  None. FINDINGS: CT HEAD FINDINGS Brain: There is no evidence of an acute infarct, intracranial hemorrhage, mass, midline shift, or extra-axial fluid collection. The ventricles and sulci are normal. Vascular: Calcified atherosclerosis at the skull base. No hyperdense vessel. Skull: No acute fracture or suspicious osseous lesion. Sinuses/Orbits: Visualized paranasal sinuses and mastoid air cells are clear. Unremarkable orbits. Other: None.  CT CERVICAL SPINE FINDINGS Alignment: Mild cervical spine straightening.  No listhesis. Skull base and vertebrae: No acute fracture or suspicious osseous lesion. Soft tissues and spinal canal: No prevertebral fluid or swelling. No visible canal hematoma. Disc levels: Moderate disc space narrowing and degenerative endplate sclerosis and spurring at C3-4, C4-5, and C5-6. Mild-to-moderate spinal stenosis and right neural foraminal stenosis at C4-5. Upper chest: Clear lung apices. Other: None. IMPRESSION: 1. No evidence of acute intracranial abnormality. 2. No evidence of acute fracture or subluxation in  the cervical spine. Electronically Signed   By: Logan Bores M.D.   On: 10/18/2021 14:34   CT CERVICAL SPINE WO CONTRAST  Result Date: 10/18/2021 CLINICAL DATA:  Head trauma.  Weakness with a fall yesterday. EXAM: CT HEAD WITHOUT CONTRAST CT CERVICAL SPINE WITHOUT CONTRAST TECHNIQUE: Multidetector CT imaging of the head and cervical spine was performed following the standard protocol without intravenous contrast. Multiplanar CT image reconstructions of the cervical spine were also generated. RADIATION DOSE REDUCTION: This exam was performed according to the departmental dose-optimization program which includes automated exposure control, adjustment of the mA and/or kV according to patient size and/or use of iterative reconstruction technique. COMPARISON:  None. FINDINGS: CT HEAD FINDINGS Brain: There is no evidence of an acute infarct, intracranial hemorrhage, mass, midline shift, or extra-axial fluid collection. The ventricles and sulci are normal. Vascular: Calcified atherosclerosis at the skull base. No hyperdense vessel. Skull: No acute fracture or suspicious osseous lesion. Sinuses/Orbits: Visualized paranasal sinuses and mastoid air cells are clear. Unremarkable orbits. Other: None. CT CERVICAL SPINE FINDINGS Alignment: Mild cervical spine straightening.  No listhesis. Skull base and vertebrae: No acute fracture or suspicious osseous lesion. Soft tissues and spinal canal: No prevertebral fluid or swelling. No visible canal hematoma. Disc levels: Moderate disc space narrowing and degenerative endplate sclerosis and spurring at C3-4, C4-5, and C5-6. Mild-to-moderate spinal stenosis and right neural foraminal stenosis at C4-5. Upper chest: Clear lung apices. Other: None. IMPRESSION: 1. No evidence of acute intracranial abnormality. 2. No evidence of acute fracture or subluxation in the cervical spine. Electronically Signed   By: Logan Bores M.D.   On: 10/18/2021 14:34   CT ABDOMEN PELVIS W  CONTRAST  Result Date: 10/19/2021 CLINICAL DATA:  Anemia, suspected retroperitoneal bleed/hematoma EXAM: CT ABDOMEN AND PELVIS WITH CONTRAST TECHNIQUE: Multidetector CT imaging of the abdomen and pelvis was performed using the standard protocol following bolus administration of intravenous contrast. RADIATION DOSE REDUCTION: This exam was performed according to the departmental dose-optimization program which includes automated exposure control, adjustment of the mA and/or kV according to patient size and/or use of iterative reconstruction technique. CONTRAST:  42mL OMNIPAQUE IOHEXOL 350 MG/ML SOLN IV. No oral contrast. COMPARISON:  12/08/2017 FINDINGS: Lower chest: Mild bibasilar atelectasis Hepatobiliary: Gallbladder surgically absent. Liver normal appearance Pancreas: Normal appearance Spleen: Normal appearance Adrenals/Urinary Tract: Adrenal glands, kidneys, ureters, and bladder normal appearance Stomach/Bowel: Normal appendix. Stool in rectum. Scattered colonic diverticulosis without evidence of diverticulitis. Tiny hiatal hernia. Stomach and bowel loops otherwise normal appearance. Vascular/Lymphatic: Atherosclerotic calcification aorta and iliac arteries without aneurysm. No adenopathy. Reproductive: Unremarkable uterus and ovaries Other: No free air or free fluid. No hernia or inflammatory process. No retroperitoneal hemorrhage or infiltration. Musculoskeletal: Diffuse osseous demineralization. Scattered degenerative disc disease changes of thoracolumbar spine. IMPRESSION: Colonic diverticulosis without evidence of diverticulitis. Tiny hiatal hernia. No acute intra-abdominal or intrapelvic abnormalities. Specifically, no evidence of retroperitoneal hemorrhage. Aortic Atherosclerosis (ICD10-I70.0). Electronically Signed   By: Lavonia Dana M.D.   On:  10/19/2021 11:06   DG Chest Port 1 View  Result Date: 10/19/2021 CLINICAL DATA:  Leukocytosis. EXAM: PORTABLE CHEST 1 VIEW COMPARISON:  Chest radiograph  dated 09/08/2021. FINDINGS: No focal consolidation, pleural effusion, pneumothorax. Top-normal cardiac size. Difficult device. No acute osseous pathology. IMPRESSION: No acute cardiopulmonary process. Electronically Signed   By: Anner Crete M.D.   On: 10/19/2021 02:38   DG Knee Complete 4 Views Left  Result Date: 10/18/2021 CLINICAL DATA:  [place].  Left knee pain. EXAM: LEFT KNEE - COMPLETE 4+ VIEW COMPARISON:  None. FINDINGS: The prosthetic components are intact. No periprosthetic fracture. No joint effusion. IMPRESSION: Intact total knee arthroplasty components.  No acute bony findings. Electronically Signed   By: Marijo Sanes M.D.   On: 10/18/2021 15:43   [place] LT LOWER EXTREM LTD SOFT TISSUE NON VASCULAR  Result Date: 10/19/2021 CLINICAL DATA:  Acute blood loss.  Anemia.  Evaluate thigh hematoma. EXAM: ULTRASOUND left LOWER EXTREMITY LIMITED TECHNIQUE: Ultrasound examination of the lower extremity soft tissues was performed in the area of clinical concern. COMPARISON:  None. FINDINGS: Scanning of the left thigh. There is diffuse soft tissue edema. Anterior to the knee, there is a complex hypoechoic fluid collection measuring 7.6 x 2.2 x 6.6 cm. This most compatible with soft tissue hematoma anterior to the knee. Prior knee replacement noted on x-ray. No hypervascularity on Doppler. IMPRESSION: Complex fluid collection anterior to the knee compatible with soft tissue hematoma. Electronically Signed   By: Franchot Gallo M.D.   On: 10/19/2021 18:31   DG Hip Unilat W or Wo Pelvis 2-3 Views Left  Result Date: 10/18/2021 CLINICAL DATA:  [place]. EXAM: DG HIP (WITH OR WITHOUT PELVIS) 2-3V LEFT COMPARISON:  None. FINDINGS: Both hips are normally located. No acute hip fracture. The pubic symphysis and SI joints are intact. No pelvic fractures. IMPRESSION: No acute bony findings. Electronically Signed   By: Marijo Sanes M.D.   On: 10/18/2021 15:44    Labs: Basic Metabolic Panel: Recent Labs  Lab  10/18/21 1132 10/18/21 1436 10/19/21 0539 10/20/21 0614 10/25/21 0526  NA 139  --  142 138 140  K 4.8  --  4.2 4.2 4.0  CL 101  --  111 106 109  CO2 26  --  28 24 25   GLUCOSE 147*  --  110* 124* 99  BUN 36*  --  42* 44* 36*  CREATININE 1.16*  --  1.53* 1.31* 0.90  CALCIUM 8.7*  --  7.7* 7.6* 8.4*  MG  --  2.1 2.0  --   --    CBC: Recent Labs  Lab 10/18/21 1132 10/19/21 0539 10/20/21 0614 10/20/21 1646 10/21/21 0532 10/22/21 0455 10/23/21 0513 10/24/21 0622 10/25/21 0526  WBC 13.1* 10.6*   < > 9.7 7.6 8.1 9.4 9.4 9.1  NEUTROABS 10.1* 7.6  --  6.6  --   --   --   --   --   HGB 10.5* 8.0*   < > 8.7* 8.6* 8.3* 9.0* 8.4* 8.4*  HCT 33.6* 26.1*   < > 26.5* 26.7* 26.3* 28.2* 26.6* 26.1*  MCV 93.1 94.6   < > 90.1 92.1 92.6 94.3 93.0 92.9  PLT 288 197   < > 178 146* 168 198 259 247   < > = values in this interval not displayed.   Microbiology: None   Time coordinating discharge: Over 30 minutes  Richarda Osmond, MD  [facility] 10/25/2021, 10:31 AM

## 2021-10-25 NOTE — Care Management Important Message (Signed)
Important Message ? ?Patient Details  ?Name: [name] ?MRN: [id number] ?Date of Birth: 15-Nov-1950 ? ? ?Medicare Important Message Given:  Yes ? ? ? ? ?Olegario Messier A Mackinze Criado ?10/25/2021, 11:30 AM ?

## 2021-10-25 NOTE — Progress Notes (Signed)
Report called to RN at [facility]. 

## 2021-10-26 DIAGNOSIS — W1800XA Striking against unspecified object with subsequent fall, initial encounter: Secondary | ICD-10-CM | POA: Diagnosis not present

## 2021-10-26 DIAGNOSIS — F325 Major depressive disorder, single episode, in full remission: Secondary | ICD-10-CM | POA: Diagnosis not present

## 2021-10-26 DIAGNOSIS — Z96652 Presence of left artificial knee joint: Secondary | ICD-10-CM | POA: Diagnosis not present

## 2021-10-26 DIAGNOSIS — I4891 Unspecified atrial fibrillation: Secondary | ICD-10-CM | POA: Diagnosis not present

## 2021-10-26 DIAGNOSIS — I5032 Chronic diastolic (congestive) heart failure: Secondary | ICD-10-CM | POA: Diagnosis not present

## 2021-11-01 DIAGNOSIS — M25562 Pain in left knee: Secondary | ICD-10-CM | POA: Diagnosis not present

## 2021-11-11 DIAGNOSIS — Z96652 Presence of left artificial knee joint: Secondary | ICD-10-CM | POA: Diagnosis not present

## 2021-11-13 DIAGNOSIS — E039 Hypothyroidism, unspecified: Secondary | ICD-10-CM | POA: Diagnosis not present

## 2021-11-13 DIAGNOSIS — Z96653 Presence of artificial knee joint, bilateral: Secondary | ICD-10-CM | POA: Diagnosis not present

## 2021-11-13 DIAGNOSIS — I5032 Chronic diastolic (congestive) heart failure: Secondary | ICD-10-CM | POA: Diagnosis not present

## 2021-11-13 DIAGNOSIS — F325 Major depressive disorder, single episode, in full remission: Secondary | ICD-10-CM | POA: Diagnosis not present

## 2021-11-13 DIAGNOSIS — T24032D Burn of unspecified degree of left lower leg, subsequent encounter: Secondary | ICD-10-CM | POA: Diagnosis not present

## 2021-11-13 DIAGNOSIS — I4891 Unspecified atrial fibrillation: Secondary | ICD-10-CM | POA: Diagnosis not present

## 2021-11-13 DIAGNOSIS — U071 COVID-19: Secondary | ICD-10-CM | POA: Diagnosis not present

## 2021-11-13 DIAGNOSIS — I130 Hypertensive heart and chronic kidney disease with heart failure and stage 1 through stage 4 chronic kidney disease, or unspecified chronic kidney disease: Secondary | ICD-10-CM | POA: Diagnosis not present

## 2021-11-13 DIAGNOSIS — N189 Chronic kidney disease, unspecified: Secondary | ICD-10-CM | POA: Diagnosis not present

## 2021-11-13 DIAGNOSIS — Z9181 History of falling: Secondary | ICD-10-CM | POA: Diagnosis not present

## 2021-11-19 ENCOUNTER — Encounter (HOSPITAL_BASED_OUTPATIENT_CLINIC_OR_DEPARTMENT_OTHER): Payer: PPO | Attending: Internal Medicine | Admitting: Internal Medicine

## 2021-11-19 DIAGNOSIS — Z7901 Long term (current) use of anticoagulants: Secondary | ICD-10-CM | POA: Diagnosis not present

## 2021-11-19 DIAGNOSIS — I480 Paroxysmal atrial fibrillation: Secondary | ICD-10-CM | POA: Diagnosis not present

## 2021-11-19 DIAGNOSIS — T798XXA Other early complications of trauma, initial encounter: Secondary | ICD-10-CM | POA: Insufficient documentation

## 2021-11-19 DIAGNOSIS — L97822 Non-pressure chronic ulcer of other part of left lower leg with fat layer exposed: Secondary | ICD-10-CM | POA: Diagnosis not present

## 2021-11-19 DIAGNOSIS — X58XXXA Exposure to other specified factors, initial encounter: Secondary | ICD-10-CM | POA: Insufficient documentation

## 2021-11-19 NOTE — Progress Notes (Signed)
TYREONNA, LIVING ([id number]) ?Visit Report for 11/19/2021 ?Abuse Risk Screen Details ?Patient Name: Date of Service: ?NO Christine Lawrence [name] 11/19/2021 9:00 A M ?Medical Record Number: [id number] ?Patient Account Number: [id number] ?Date of Birth/Sex: Treating RN: ?[birth year] (71 y.o. [name], [name] ?Primary Care [name]: [name] Other Clinician: ?Referring Shontelle Muska: ?Treating Makhai Fulco/Extender: Kalman Shan ?[name] ?Weeks in Treatment: 0 ?Abuse Risk Screen Items ?Answer ?ABUSE RISK SCREEN: ?Has anyone close to you tried to hurt or harm you recentlyo No ?Do you feel uncomfortable with anyone in your familyo No ?Has anyone forced you do things that you didnt want to doo No ?Electronic Signature(s) ?Signed: 11/19/2021 12:49:17 PM By: Rhae Hammock RN ?Entered By: Rhae Hammock on 11/19/2021 08:57:41 ?-------------------------------------------------------------------------------- ?Activities of Daily Living Details ?Patient Name: Date of Service: ?NO Christine Lawrence [name] 11/19/2021 9:00 A M ?Medical Record Number: [id number] ?Patient Account Number: [id number] ?Date of Birth/Sex: Treating RN: ?08/29/1950 (71 y.o. [name], [name] ?Primary Care Kein Carlberg: [name] Other Clinician: ?Referring Tamila Gaulin: ?Treating Krystal Teachey/Extender: Kalman Shan ?[name] ?Weeks in Treatment: 0 ?Activities of Daily Living Items ?Answer ?Activities of Daily Living (Please select one for each item) ?Drive Automobile Need Assistance ?T Medications ?ake Completely Able ?Use T elephone Completely Able ?Care for Appearance Need Assistance ?Use T oilet Need Assistance ?Bath / Shower Need Assistance ?Dress Self Need Assistance ?Feed Self Need Assistance ?Walk Need Assistance ?Get In / Out Bed Need Assistance ?Housework Need Assistance ?Prepare Meals Need Assistance ?Handle Money Need Assistance ?Shop for Self Need Assistance ?Electronic Signature(s) ?Signed: 11/19/2021 12:49:17 PM By: Rhae Hammock  RN ?Entered By: Rhae Hammock on 11/19/2021 09:00:27 ?-------------------------------------------------------------------------------- ?Education Screening Details ?Patient Name: ?Date of Service: ?NO Christine Lawrence [name] 11/19/2021 9:00 A M ?Medical Record Number: [id number] ?Patient Account Number: [id number] ?Date of Birth/Sex: ?Treating RN: ?20-Apr-1951 (71 y.o. [name], [name] ?Primary Care Zalma Channing: [name] ?Other Clinician: ?Referring Llesenia Fogal: ?Treating Constanza Mincy/Extender: Kalman Shan ?[name] ?Weeks in Treatment: 0 ?Primary Learner Assessed: Patient ?Learning Preferences/Education Level/Primary Language ?Learning Preference: Explanation, Demonstration, Communication Board, Printed Material ?Highest Education Level: College or Above ?Preferred Language: English ?Cognitive Barrier ?Language Barrier: No ?Translator Needed: No ?Memory Deficit: No ?Emotional Barrier: No ?Cultural/Religious Beliefs Affecting Medical Care: No ?Physical Barrier ?Impaired Vision: Yes Glasses ?Impaired Hearing: No ?Decreased Hand dexterity: No ?Knowledge/Comprehension ?Knowledge Level: High ?Comprehension Level: High ?Ability to understand written instructions: High ?Ability to understand verbal instructions: High ?Motivation ?Anxiety Level: Calm ?Cooperation: Cooperative ?Education Importance: Denies Need ?Interest in Health Problems: Asks Questions ?Perception: Coherent ?Willingness to Engage in Self-Management High ?Activities: ?Readiness to Engage in Self-Management High ?Activities: ?Electronic Signature(s) ?Signed: 11/19/2021 12:49:17 PM By: Rhae Hammock RN ?Entered By: Rhae Hammock on 11/19/2021 08:59:46 ?-------------------------------------------------------------------------------- ?Fall Risk Assessment Details ?Patient Name: ?Date of Service: ?NO Christine Lawrence [name] 11/19/2021 9:00 A M ?Medical Record Number: [id number] ?Patient Account Number: [id number] ?Date of Birth/Sex: ?Treating RN: ?1950/10/26  (71 y.o. [name], [name] ?Primary Care Maloree Uplinger: [name] ?Other Clinician: ?Referring Tynesha Free: ?Treating Ashtin Melichar/Extender: Kalman Shan ?[name] ?Weeks in Treatment: 0 ?Fall Risk Assessment Items ?Have you had 2 or more falls in the last 12 monthso 0 Yes ?Have you had any fall that resulted in injury in the last 12 monthso 0 Yes ?FALLS RISK SCREEN ?History of falling - immediate or within 3 months 25 Yes ?Secondary diagnosis (Do you have 2 or more medical diagnoseso) 0 No ?Ambulatory aid ?None/bed rest/wheelchair/nurse 0 Yes ?Crutches/cane/walker 0 No ?Furniture 0 No ?Intravenous therapy Access/Saline/Heparin Lock 0 No ?Gait/Transferring ?Normal/  bed rest/ wheelchair 0 No ?Weak (short steps with or without shuffle, stooped but able to lift head while walking, may seek 0 No ?support from furniture) ?Impaired (short steps with shuffle, may have difficulty arising from chair, head down, impaired 0 No ?balance) ?Mental Status ?Oriented to own ability 0 No ?Electronic Signature(s) ?Signed: 11/19/2021 12:49:17 PM By: Rhae Hammock RN ?Entered By: Rhae Hammock on 11/19/2021 08:58:01 ?-------------------------------------------------------------------------------- ?Foot Assessment Details ?Patient Name: ?Date of Service: ?NO Christine Lawrence [name] 11/19/2021 9:00 A M ?Medical Record Number: [id number] ?Patient Account Number: [id number] ?Date of Birth/Sex: ?Treating RN: ?[birth year] (71 y.o. [name], [name] ?Primary Care Syrai Gladwin: [name] ?Other Clinician: ?Referring Teondre Jarosz: ?Treating Delois Silvester/Extender: Kalman Shan ?[name] ?Weeks in Treatment: 0 ?Foot Assessment Items ?Site Locations ?+ = Sensation present, - = Sensation absent, C = Callus, U = Ulcer ?R = Redness, W = Warmth, M = Maceration, PU = Pre-ulcerative lesion ?F = Fissure, S = Swelling, D = Dryness ?Assessment ?Right: Left: ?Other Deformity: No No ?Prior Foot Ulcer: No No ?Prior Amputation: No No ?Charcot Joint: No  No ?Ambulatory Status: Ambulatory With Help ?Assistance Device: Wheelchair ?Gait: Unsteady ?Electronic Signature(s) ?Signed: 11/19/2021 12:49:17 PM By: Rhae Hammock RN ?Entered By: Rhae Hammock on 11/19/2021 08:59:10 ?-------------------------------------------------------------------------------- ?Nutrition Risk Screening Details ?Patient Name: ?Date of Service: ?NO Christine Lawrence [name] 11/19/2021 9:00 A M ?Medical Record Number: [id number] ?Patient Account Number: [id number] ?Date of Birth/Sex: ?Treating RN: ?08/29/1950 (71 y.o. [name], [name] ?Primary Care Aakash Hollomon: [name] ?Other Clinician: ?Referring Kaden Daughdrill: ?Treating Abdul Beirne/Extender: Kalman Shan ?[name] ?Weeks in Treatment: 0 ?Height (in): 67 ?Weight (lbs): 340 ?Body Mass Index (BMI): 53.2 ?Nutrition Risk Screening Items ?Score Screening ?NUTRITION RISK SCREEN: ?I have an illness or condition that made me change the kind and/or amount of food I eat 0 No ?I eat fewer than two meals per day 0 No ?I eat few fruits and vegetables, or milk products 0 No ?I have three or more drinks of beer, liquor or wine almost every day 0 No ?I have tooth or mouth problems that make it hard for me to eat 0 No ?I don't always have enough money to buy the food I need 0 No ?I eat alone most of the time 0 No ?I take three or more different prescribed or over-the-counter drugs a day 0 No ?Without wanting to, I have lost or gained 10 pounds in the last six months 0 No ?I am not always physically able to shop, cook and/or feed myself 0 No ?Nutrition Protocols ?Good Risk Protocol 0 No interventions needed ?Moderate Risk Protocol ?High Risk Proctocol ?Risk Level: Good Risk ?Score: 0 ?Electronic Signature(s) ?Signed: 11/19/2021 12:49:17 PM By: Rhae Hammock RN ?Entered By: Rhae Hammock on 11/19/2021 08:58:06 ?

## 2021-11-19 NOTE — Progress Notes (Signed)
Christine, [name] ([id number]) ?Visit Report for 11/19/2021 ?Allergy List Details ?Patient Name: Date of Service: ?NO Christine Lawrence [name] 11/19/2021 9:00 A M ?Medical Record Number: [id number] ?Patient Account Number: [id number] ?Date of Birth/Sex: Treating RN: ?1951/02/04 (72 y.o. [name], [name] ?Primary Care [name]: [name] Other Clinician: ?Referring [name]: ?Treating [name]/Extender: [name] ?[name] ?Weeks in Treatment: 0 ?Allergies ?Active Allergies ?Augmentin ?Allergy Notes ?Electronic Signature(s) ?Signed: 11/19/2021 12:49:17 PM By: Christine Hammock RN ?Entered By: [name] on 11/19/2021 08:57:33 ?-------------------------------------------------------------------------------- ?Arrival Information Details ?Patient Name: Date of Service: ?NO Christine Lawrence [name] 11/19/2021 9:00 A M ?Medical Record Number: [id number] ?Patient Account Number: [id number] ?Date of Birth/Sex: Treating RN: ?[birth year] (71 y.o. [name], [name] ?Primary Care Sholanda Croson: [name] Other Clinician: ?Referring [name]: ?Treating [name]/Extender: [name] ?[name] ?Weeks in Treatment: 0 ?Visit Information ?Patient Arrived: Wheel Chair ?Arrival Time: 08:56 ?Accompanied By: husband ?Transfer Assistance: Manual ?Patient Identification Verified: Yes ?Secondary Verification Process Completed: Yes ?Patient Requires Transmission-Based Precautions: No ?Patient Has Alerts: Yes ?Patient Alerts: Patient on Blood Thinner ?Electronic Signature(s) ?Signed: 11/19/2021 12:49:17 PM By: Christine Hammock RN ?Entered By: [name] on 11/19/2021 08:56:53 ?-------------------------------------------------------------------------------- ?Clinic Level of Care Assessment Details ?Patient Name: Date of Service: ?NO Christine Lawrence [name] 11/19/2021 9:00 A M ?Medical Record Number: [id number] ?Patient Account Number: [id number] ?Date of Birth/Sex: Treating RN: ?25-Oct-1950 (71 y.o. F) [name] ?Primary  Care Johnattan Strassman: [name] Other Clinician: ?Referring [name]: ?Treating Cherelle Midkiff/Extender: [name] ?[name] ?Weeks in Treatment: 0 ?Clinic Level of Care Assessment Items ?TOOL 1 Quantity Score ?X- 1 0 ?Use when EandM and Procedure is performed on INITIAL visit ?ASSESSMENTS - Nursing Assessment / Reassessment ?X- 1 20 ?General Physical Exam (combine w/ comprehensive assessment (listed just below) when performed on new pt. evals) ?X- 1 25 ?Comprehensive Assessment (HX, ROS, Risk Assessments, Wounds Hx, etc.) ?ASSESSMENTS - Wound and Skin Assessment / Reassessment ?X- 1 10 ?Dermatologic / Skin Assessment (not related to wound area) ?ASSESSMENTS - Ostomy and/or Continence Assessment and Care ?[]  - 0 ?Incontinence Assessment and Management ?[]  - 0 ?Ostomy Care Assessment and Management (repouching, etc.) ?PROCESS - Coordination of Care ?[]  - 0 ?Simple Patient / Family Education for ongoing care ?X- 1 20 ?Complex (extensive) Patient / Family Education for ongoing care ?X- 1 10 ?Staff obtains Consents, Records, T Results / Process Orders ?est ?X- 1 10 ?Staff telephones HHA, Nursing Homes / Clarify orders / etc ?[]  - 0 ?Routine Transfer to another Facility (non-emergent condition) ?[]  - 0 ?Routine Hospital Admission (non-emergent condition) ?X- 1 15 ?New Admissions / Biomedical engineer / Ordering NPWT Apligraf, etc. ?, ?[]  - 0 ?Emergency Hospital Admission (emergent condition) ?PROCESS - Special Needs ?[]  - 0 ?Pediatric / Minor Patient Management ?[]  - 0 ?Isolation Patient Management ?[]  - 0 ?Hearing / Language / Visual special needs ?[]  - 0 ?Assessment of Community assistance (transportation, D/C planning, etc.) ?[]  - 0 ?Additional assistance / Altered mentation ?[]  - 0 ?Support Surface(s) Assessment (bed, cushion, seat, etc.) ?INTERVENTIONS - Miscellaneous ?[]  - 0 ?External ear exam ?[]  - 0 ?Patient Transfer (multiple staff / Civil Service fast streamer / Similar devices) ?[]  - 0 ?Simple Staple / Suture removal  (25 or less) ?[]  - 0 ?Complex Staple / Suture removal (26 or more) ?[]  - 0 ?Hypo/Hyperglycemic Management (do not check if billed separately) ?X- 1 15 ?Ankle / Brachial Index (ABI) - do not check if billed separately ?Has the patient been seen at the hospital within the last three years: Yes ?Total  Score: 125 ?Level Of Care: New/Established - Level 4 ?Electronic Signature(s) ?Signed: 11/19/2021 1:08:47 PM By: Deon Pilling RN, BSN ?Entered By: Deon Pilling on 11/19/2021 09:35:31 ?-------------------------------------------------------------------------------- ?Encounter Discharge Information Details ?Patient Name: ?Date of Service: ?NO Christine Lawrence [name] 11/19/2021 9:00 A M ?Medical Record Number: [id number] ?Patient Account Number: [id number] ?Date of Birth/Sex: ?Treating RN: ?Sep 11, 1950 (71 y.o. F) [name] ?Primary Care [name]: [name] ?Other Clinician: ?Referring [name]: ?Treating Jalaine Riggenbach/Extender: [name] ?[name] ?Weeks in Treatment: 0 ?Encounter Discharge Information Items Post Procedure Vitals ?Discharge Condition: Stable ?Temperature (F): 98.4 ?Ambulatory Status: Wheelchair ?Pulse (bpm): 56 ?Discharge Destination: Home ?Respiratory Rate (breaths/min): 17 ?Transportation: Private Auto ?Blood Pressure (mmHg): 130/74 ?Accompanied By: self ?Schedule Follow-up Appointment: Yes ?Clinical Summary of Care: ?Electronic Signature(s) ?Signed: 11/19/2021 1:08:47 PM By: Deon Pilling RN, BSN ?Entered By: Deon Pilling on 11/19/2021 10:09:05 ?-------------------------------------------------------------------------------- ?Lower Extremity Assessment Details ?Patient Name: ?Date of Service: ?NO Christine Lawrence [name] 11/19/2021 9:00 A M ?Medical Record Number: [id number] ?Patient Account Number: [id number] ?Date of Birth/Sex: ?Treating RN: ?June 05, 1951 (71 y.o. [name], [name] ?Primary Care [name]: [name] ?Other Clinician: ?Referring Sundi Slevin: ?Treating Cru Kritikos/Extender: [name] ?[name] ?Weeks in Treatment: 0 ?Edema Assessment ?Assessed: [Left: Yes] [Right: No] ?Edema: [Left: Ye] [Right: s] ?Calf ?Left: Right: ?Point of Measurement: 31 cm From Medial Instep 58 cm ?Ankle ?Left: Right: ?Point of Measurement: 9 cm From Medial Instep 25.5 cm ?Knee To Floor ?Left: Right: ?From Medial Instep 41 cm ?Vascular Assessment ?Pulses: ?Dorsalis Pedis ?Palpable: [Left:Yes] ?Posterior Tibial ?Palpable: [Left:Yes] ?Blood Pressure: ?Brachial: [Left:130] ?Ankle: ?[Left:Dorsalis Pedis: [id number] ?Ankle Brachial Index: [Left:1.43] ?Electronic Signature(s) ?Signed: 11/19/2021 12:49:17 PM By: Christine Hammock RN ?Entered By: [name] on 11/19/2021 09:17:57 ?-------------------------------------------------------------------------------- ?Multi Wound Chart Details ?Patient Name: ?Date of Service: ?NO Christine Lawrence [name] 11/19/2021 9:00 A M ?Medical Record Number: [id number] ?Patient Account Number: [id number] ?Date of Birth/Sex: ?Treating RN: ?1950-12-25 (71 y.o. F) ?Primary Care Meyah Corle: [name] ?Other Clinician: ?Referring Delania Ferg: ?Treating Khylah Kendra/Extender: [name] ?[name] ?Weeks in Treatment: 0 ?Vital Signs ?Height(in): 67 ?Pulse(bpm): 56 ?Weight(lbs): 340 ?Blood Pressure(mmHg): 130/74 ?Body Mass Index(BMI): 53.2 ?Temperature(??F): 98.4 ?Respiratory Rate(breaths/min): 17 ?Photos: [N/A:N/A] ?Left, Lateral Lower Leg N/A N/A ?Wound Location: ?Trauma N/A N/A ?Wounding Event: ?Trauma, Other N/A N/A ?Primary Etiology: ?Congestive Heart Failure, N/A N/A ?Comorbid History: ?Hypertension ?10/18/2021 N/A N/A ?Date Acquired: ?0 N/A N/A ?Weeks of Treatment: ?Open N/A N/A ?Wound Status: ?No N/A N/A ?Wound Recurrence: ?Yes N/A N/A ?Clustered Wound: ?2 N/A N/A ?Clustered Quantity: ?14.5x17x8.5 N/A N/A ?Measurements L x W x D (cm) ?193.601 N/A N/A ?A (cm?) : ?rea ?1645.606 N/A N/A ?Volume (cm?) : ?0.00% N/A N/A ?% Reduction in A rea: ?0.00% N/A N/A ?% Reduction in Volume: ?Full Thickness  Without Exposed N/A N/A ?Classification: ?Support Structures ?Large N/A N/A ?Exudate A mount: ?Serosanguineous N/A N/A ?Exudate Type: ?red, brown N/A N/A ?Exudate Color: ?Distinct, outline attached N/A N/A ?Wound

## 2021-11-20 ENCOUNTER — Encounter (HOSPITAL_COMMUNITY): Payer: Self-pay

## 2021-11-20 ENCOUNTER — Other Ambulatory Visit: Payer: Self-pay

## 2021-11-20 ENCOUNTER — Emergency Department (HOSPITAL_COMMUNITY)
Admission: EM | Admit: 2021-11-20 | Discharge: 2021-11-20 | Disposition: A | Discharge status: Home / Self Care | Payer: PPO | Attending: Emergency Medicine | Admitting: Emergency Medicine

## 2021-11-20 DIAGNOSIS — S81802A Unspecified open wound, left lower leg, initial encounter: Secondary | ICD-10-CM | POA: Insufficient documentation

## 2021-11-20 DIAGNOSIS — I110 Hypertensive heart disease with heart failure: Secondary | ICD-10-CM | POA: Insufficient documentation

## 2021-11-20 DIAGNOSIS — L97929 Non-pressure chronic ulcer of unspecified part of left lower leg with unspecified severity: Secondary | ICD-10-CM | POA: Insufficient documentation

## 2021-11-20 DIAGNOSIS — I5030 Unspecified diastolic (congestive) heart failure: Secondary | ICD-10-CM | POA: Diagnosis not present

## 2021-11-20 DIAGNOSIS — Z4800 Encounter for change or removal of nonsurgical wound dressing: Secondary | ICD-10-CM | POA: Diagnosis not present

## 2021-11-20 DIAGNOSIS — E039 Hypothyroidism, unspecified: Secondary | ICD-10-CM | POA: Insufficient documentation

## 2021-11-20 DIAGNOSIS — S8992XA Unspecified injury of left lower leg, initial encounter: Secondary | ICD-10-CM | POA: Diagnosis present

## 2021-11-20 DIAGNOSIS — W19XXXA Unspecified fall, initial encounter: Secondary | ICD-10-CM | POA: Insufficient documentation

## 2021-11-20 DIAGNOSIS — Z79899 Other long term (current) drug therapy: Secondary | ICD-10-CM | POA: Insufficient documentation

## 2021-11-20 DIAGNOSIS — Z7901 Long term (current) use of anticoagulants: Secondary | ICD-10-CM | POA: Diagnosis not present

## 2021-11-20 DIAGNOSIS — Z5189 Encounter for other specified aftercare: Secondary | ICD-10-CM

## 2021-11-20 DIAGNOSIS — R58 Hemorrhage, not elsewhere classified: Secondary | ICD-10-CM | POA: Diagnosis not present

## 2021-11-20 LAB — CBC WITH DIFFERENTIAL/PLATELET
Abs Immature Granulocytes: 0.02 10*3/uL (ref 0.00–0.07)
Basophils Absolute: 0.1 10*3/uL (ref 0.0–0.1)
Basophils Relative: 1 %
Eosinophils Absolute: 0.2 10*3/uL (ref 0.0–0.5)
Eosinophils Relative: 3 %
HCT: 33.1 % — ABNORMAL LOW (ref 36.0–46.0)
Hemoglobin: 10 g/dL — ABNORMAL LOW (ref 12.0–15.0)
Immature Granulocytes: 0 %
Lymphocytes Relative: 21 %
Lymphs Abs: 1.5 10*3/uL (ref 0.7–4.0)
MCH: 28.7 pg (ref 26.0–34.0)
MCHC: 30.2 g/dL (ref 30.0–36.0)
MCV: 95.1 fL (ref 80.0–100.0)
Monocytes Absolute: 0.4 10*3/uL (ref 0.1–1.0)
Monocytes Relative: 6 %
Neutro Abs: 5 10*3/uL (ref 1.7–7.7)
Neutrophils Relative %: 69 %
Platelets: 237 10*3/uL (ref 150–400)
RBC: 3.48 MIL/uL — ABNORMAL LOW (ref 3.87–5.11)
RDW: 15 % (ref 11.5–15.5)
WBC: 7.2 10*3/uL (ref 4.0–10.5)
nRBC: 0 % (ref 0.0–0.2)

## 2021-11-20 LAB — BASIC METABOLIC PANEL
Anion gap: 8 (ref 5–15)
BUN: 26 mg/dL — ABNORMAL HIGH (ref 8–23)
CO2: 30 mmol/L (ref 22–32)
Calcium: 8.9 mg/dL (ref 8.9–10.3)
Chloride: 102 mmol/L (ref 98–111)
Creatinine, Ser: 1.1 mg/dL — ABNORMAL HIGH (ref 0.44–1.00)
GFR, Estimated: 53 mL/min — ABNORMAL LOW (ref >60)
Glucose, Bld: 109 mg/dL — ABNORMAL HIGH (ref 70–99)
Potassium: 4.4 mmol/L (ref 3.5–5.1)
Sodium: 140 mmol/L (ref 135–145)

## 2021-11-20 MED ORDER — LIDOCAINE HCL (PF) 1 % IJ SOLN
5.0000 mL | Freq: Once | INTRAMUSCULAR | Status: AC
  Administered 2021-11-20: 5 mL
  Filled 2021-11-20: qty 5

## 2021-11-20 NOTE — Discharge Instructions (Signed)
Keep your scheduled follow-up appointments with wound care ? ?Return for any worsening symptoms ?

## 2021-11-20 NOTE — ED Provider Notes (Signed)
?[facility] ?Provider Note ? ? ?CSN: [id number] ?Arrival date & time: 11/20/21  1302 ? ?  ? ?History ? ?No chief complaint on file. ? ? ?[name] is a 71 y.o. female with a past medical history of hypertension, hypothyroidism, diastolic heart failure, obesity who presents for evaluation of bleeding from open wound.  Patient was recently discharged from [facility] after an extended admission.  She is anticoagulated for A-fib, had a fall on 227 and developed a significant hematoma, with associated hypotension and need for blood transfusion.,  Developed infected hematoma and was discharged with a large open wound.  She was seen at wound care yesterday.  She has an area with necrotic tissue that was packed and is doing wet-to-dry dressings.  Her daughter-in-law skin taking care of ? ?HPI ? ?  ? ?Home Medications ?Prior to Admission medications   ?Medication Sig Start Date End Date Taking? Authorizing Provider  ?acetaminophen (TYLENOL) 500 MG tablet Take 1,000 mg by mouth at bedtime as needed for mild pain.   Yes [provider]  ?apixaban (ELIQUIS) 5 MG TABS tablet TAKE 1 TABLET BY MOUTH 2 TIMES DAILY. ?Patient taking differently: Take 5 mg by mouth 2 (two) times daily. 09/13/21  Yes Deboraha Sprang, MD  ?escitalopram (LEXAPRO) 5 MG tablet Take 5 mg by mouth daily. 12/28/20  Yes [provider]  ?flecainide (TAMBOCOR) 100 MG tablet TAKE 1 TABLET BY MOUTH 2 TIMES A DAY ?Patient taking differently: Take 100 mg by mouth 2 (two) times daily. 10/04/21  Yes Deboraha Sprang, MD  ?furosemide (LASIX) 20 MG tablet Take 40 mg by mouth every morning.    Yes [provider]  ?levothyroxine (SYNTHROID, LEVOTHROID) 75 MCG tablet Take 75 mcg by mouth daily before breakfast.  09/07/15  Yes [provider]  ?losartan (COZAAR) 100 MG tablet Take 1 tablet (100 mg total) by mouth daily. 09/10/21  Yes Mercy Riding, MD  ?metoprolol tartrate  (LOPRESSOR) 50 MG tablet Take 1 tablet (50 mg total) by mouth 2 (two) times daily. 09/21/21  Yes Deboraha Sprang, MD  ?Sodium Hypochlorite (DAKINS, FULL STRENGTH,) 0.5 % SOLN Apply 1 application. topically 2 (two) times daily. 11/15/21 11/25/21 Yes [provider]  ?vitamin C (ASCORBIC ACID) 500 MG tablet Take 500 mg by mouth daily.   Yes [provider]  ?zinc gluconate 50 MG tablet Take 50 mg by mouth daily.   Yes [provider]  ?   ? ?Allergies    ?Augmentin [amoxicillin-pot clavulanate] and Pneumococcal vaccines   ? ?Review of Systems   ?Review of Systems ? ?Physical Exam ?Updated Vital Signs ?BP (!) 144/71   Pulse 62   Temp 97.8 ?F (36.6 ?C) (Oral)   Resp 16   Ht 5\' 7"  (1.702 m)   Wt (!) 154.2 kg   SpO2 97%   BMI 53.25 kg/m?  ?Physical Exam ?Vitals and nursing note reviewed.  ?Constitutional:   ?   General: She is not in acute distress. ?   Appearance: She is well-developed. She is not diaphoretic.  ?HENT:  ?   Head: Normocephalic and atraumatic.  ?   Right Ear: External ear normal.  ?   Left Ear: External ear normal.  ?   Nose: Nose normal.  ?   Mouth/Throat:  ?   Mouth: Mucous membranes are moist.  ?Eyes:  ?   General: No scleral icterus. ?   Conjunctiva/sclera: Conjunctivae normal.  ?Cardiovascular:  ?  Rate and Rhythm: Normal rate and regular rhythm.  ?   Heart sounds: Normal heart sounds. No murmur heard. ?  No friction rub. No gallop.  ?Pulmonary:  ?   Effort: Pulmonary effort is normal. No respiratory distress.  ?   Breath sounds: Normal breath sounds.  ?Abdominal:  ?   General: Bowel sounds are normal. There is no distension.  ?   Palpations: Abdomen is soft. There is no mass.  ?   Tenderness: There is no abdominal tenderness. There is no guarding.  ?Musculoskeletal:  ?   Cervical back: Normal range of motion.  ?Skin: ?   General: Skin is warm and dry.  ?   Comments: Left lower extremity with large (approximately 20 cm) ulceration with large central cavity of unknown  depth. ?Tissue does not appear infected.  There is a small arterial bleed at the superior aspect of the wound.  ?Neurological:  ?   General: No focal deficit present.  ?   Mental Status: She is alert and oriented to person, place, and time.  ?Psychiatric:     ?   Behavior: Behavior normal.  ? ? ?ED Results / Procedures / Treatments   ?Labs ?(all labs ordered are listed, but only abnormal results are displayed) ?Labs Reviewed  ?BASIC METABOLIC PANEL  ?CBC WITH DIFFERENTIAL/PLATELET  ? ? ?EKG ?None ? ?Radiology ?No results found. ? ?Procedures ?Wound repair ? ?Date/Time: 11/21/2021 6:43 AM ?Performed by: Margarita Mail, PA-C ?Authorized by: Margarita Mail, PA-C  ?Consent: Verbal consent obtained. ?Risks and benefits: risks, benefits and alternatives were discussed ?Consent given by: patient ?Patient identity confirmed: verbally with patient ?Time out: Immediately prior to procedure a "time out" was called to verify the correct patient, procedure, equipment, support staff and site/side marked as required. ?Local anesthesia used: yes ?Anesthesia: local infiltration ? ?Anesthesia: ?Local anesthesia used: yes ?Local Anesthetic: lidocaine 1% without epinephrine ?Anesthetic total (ml): 5. ? ?Sedation: ?Patient sedated: no ? ?Patient tolerance: patient tolerated the procedure well with no immediate complications ?Comments: Small arterial bleed managed by tying off the vessel.  I used 5-0 Monocryl suture and applied 1 figure of 8 stitch. ?Packing removed and reinserted into the cavity in the center of the ulceration. ?Wound cleansed with sterile saline and wet to dry dressing reapplied. ? ?  ? ? ?Medications Ordered in ED ?Medications  ?lidocaine (PF) (XYLOCAINE) 1 % injection 5 mL (has no administration in time range)  ? ? ?ED Course/ Medical Decision Making/ A&P ?  ?                        ?Medical Decision Making ?Amount and/or Complexity of Data Reviewed ?Labs: ordered. ? ?Risk ?Prescription drug management. ? ?Final  Clinical Impression(s) / ED Diagnoses ?Final diagnoses:  ?None  ?71 year old female who presents emergency department for hemorrhage from an ulceration of the leg.  Hemostasis achieved by tying of the vessel.  I cleaned and managed the wound.  I have ordered labs including CBC and BMP as the patient is on a blood thinner and is is very concerned that she lost a significant amount of blood.  Her labs are currently pending.  Signout given to PA Henderly at shift change. ? ?Rx / DC Orders ?ED Discharge Orders   ? ? None  ? ?  ? ? ?  ?Margarita Mail, PA-C ?11/21/21 [id number] ? ?  ?Regan Lemming, MD ?11/21/21 1724 ? ?

## 2021-11-20 NOTE — ED Triage Notes (Addendum)
From home. Pt had a fall  in Feb which left her w a wound on her L lower leg. Wound is about 3 inches deep.  Pt's has been helping ht ept changing the dressing daily, however today, the wound started squirting blood. Bleeding controlled. Pt on blood thinner. A&O X4. VSS.  ?

## 2021-11-20 NOTE — ED Provider Notes (Signed)
Care assumed from Lind, PA-C at shift change.   ? ?See note for full HPI  ? ?[place] 71 year old recently discharged after extensive inpatient admission at [facility] after fall subsequently developed infected hematoma, discharged with wound which is currently being followed by wound care.   ? ?Wound started bleeding today sent here for evaluation  ? ?Wound was sutured by previous provider.  Plan on follow-up on CBC to ensure no significant hemoglobin drop, if within normal limits may DC home with wound care follow-up  ? ? ?Physical Exam  ?BP (!) 117/54   Pulse (!) 58   Temp 97.8 ?F (36.6 ?C) (Oral)   Resp 18   Ht 5\' 7"  (1.702 m)   Wt (!) 154.2 kg   SpO2 96%   BMI 53.25 kg/m?  ? ?Physical Exam ?Vitals and nursing note reviewed.  ?Constitutional:   ?   General: She is not in acute distress. ?   Appearance: She is well-developed. She is not ill-appearing.  ?HENT:  ?   Head: Atraumatic.  ?Eyes:  ?   Pupils: Pupils are equal, round, and reactive to light.  ?Cardiovascular:  ?   Rate and Rhythm: Normal rate.  ?Pulmonary:  ?   Effort: No respiratory distress.  ?Abdominal:  ?   General: There is no distension.  ?Musculoskeletal:     ?   General: Normal range of motion.  ?   Cervical back: Normal range of motion.  ?Skin: ?   General: Skin is warm and dry.  ?   Comments: No active bleeding  ?Neurological:  ?   General: No focal deficit present.  ?   Mental Status: She is alert.  ?Psychiatric:     ?   Mood and Affect: Mood normal.  ? ? ?Procedures  ?Procedures ?Labs Reviewed  ?BASIC METABOLIC PANEL - Abnormal; Notable for the following components:  ?    Result Value  ? Glucose, Bld 109 (*)   ? BUN 26 (*)   ? Creatinine, Ser 1.10 (*)   ? GFR, Estimated 53 (*)   ? All other components within normal limits  ?CBC WITH DIFFERENTIAL/PLATELET - Abnormal; Notable for the following components:  ? RBC 3.48 (*)   ? Hemoglobin 10.0 (*)   ? HCT 33.1 (*)   ? All other components within normal limits  ? No results found.  ?ED  Course / MDM  ?  ? ?No active bleeding. ? ?Labs personally viewed and interpreted ?CBC shows hemoglobin actually higher than baseline. ?Metabolic panel without significant normality ? ?Discussed results with patient, family in room.  Agreeable for DC home and close follow-up with wound care. ? ?The patient has been appropriately medically screened and/or stabilized in the ED. I have low suspicion for any other emergent medical condition which would require further screening, evaluation or treatment in the ED or require inpatient management. ? ?Patient is hemodynamically stable and in no acute distress.  Patient able to ambulate in department prior to ED.  Evaluation does not show acute pathology that would require ongoing or additional emergent interventions while in the emergency department or further inpatient treatment.  I have discussed the diagnosis with the patient and answered all questions.  Pain is been managed while in the emergency department and patient has no further complaints prior to discharge.  Patient is comfortable with plan discussed in room and is stable for discharge at this time.  I have discussed strict return precautions for returning to the emergency department.  Patient  was encouraged to follow-up with PCP/specialist refer to at discharge.  ? ?Medical Decision Making ?Amount and/or Complexity of Data Reviewed ?Independent Historian:  ?   Details: family ?External Data Reviewed: labs, radiology and notes. ?Labs: ordered. Decision-making details documented in ED Course. ? ?Risk ?OTC drugs. ?Prescription drug management. ?Diagnosis or treatment significantly limited by social determinants of health. ? ? ? ? ?Wound check ? ? ?  ?Rhyli Depaula A, PA-C ?11/20/21 1635 ? ?  ?Cathren Laine, MD ?11/20/21 1832 ? ?

## 2021-11-22 DIAGNOSIS — I130 Hypertensive heart and chronic kidney disease with heart failure and stage 1 through stage 4 chronic kidney disease, or unspecified chronic kidney disease: Secondary | ICD-10-CM | POA: Diagnosis not present

## 2021-11-22 DIAGNOSIS — Z96653 Presence of artificial knee joint, bilateral: Secondary | ICD-10-CM | POA: Diagnosis not present

## 2021-11-22 DIAGNOSIS — Z9181 History of falling: Secondary | ICD-10-CM | POA: Diagnosis not present

## 2021-11-22 DIAGNOSIS — N189 Chronic kidney disease, unspecified: Secondary | ICD-10-CM | POA: Diagnosis not present

## 2021-11-22 DIAGNOSIS — I4891 Unspecified atrial fibrillation: Secondary | ICD-10-CM | POA: Diagnosis not present

## 2021-11-22 DIAGNOSIS — E039 Hypothyroidism, unspecified: Secondary | ICD-10-CM | POA: Diagnosis not present

## 2021-11-22 DIAGNOSIS — F325 Major depressive disorder, single episode, in full remission: Secondary | ICD-10-CM | POA: Diagnosis not present

## 2021-11-22 DIAGNOSIS — I5032 Chronic diastolic (congestive) heart failure: Secondary | ICD-10-CM | POA: Diagnosis not present

## 2021-11-22 DIAGNOSIS — U071 COVID-19: Secondary | ICD-10-CM | POA: Diagnosis not present

## 2021-11-22 DIAGNOSIS — T24032D Burn of unspecified degree of left lower leg, subsequent encounter: Secondary | ICD-10-CM | POA: Diagnosis not present

## 2021-11-23 NOTE — Progress Notes (Signed)
REBECCAH, MEIXSELL ([id number]) ?Visit Report for 11/19/2021 ?Chief Complaint Document Details ?Patient Name: Date of Service: ?[name] 11/19/2021 9:00 A M ?Medical Record Number: [id number] ?Patient Account Number: [id number] ?Date of Birth/Sex: Treating RN: ?1950-08-29 (71 y.o. F) ?Primary Care Provider: Deland Pretty Other Clinician: ?Referring Provider: ?Treating Provider/Extender: Kalman Shan ?Deland Pretty ?Weeks in Treatment: 0 ?Information Obtained from: Patient ?Chief Complaint ?11/19/2021; Left lower extremity wound status post fall ?Electronic Signature(s) ?Signed: 11/19/2021 10:07:36 AM By: Kalman Shan DO ?Entered By: Kalman Shan on 11/19/2021 09:58:00 ?-------------------------------------------------------------------------------- ?Debridement Details ?Patient Name: Date of Service: ?[name] 11/19/2021 9:00 A M ?Medical Record Number: [id number] ?Patient Account Number: [id number] ?Date of Birth/Sex: Treating RN: ?June 18, 1951 (71 y.o. F) Deaton, Bobbi ?Primary Care Provider: Deland Pretty Other Clinician: ?Referring Provider: ?Treating Provider/Extender: Kalman Shan ?Deland Pretty ?Weeks in Treatment: 0 ?Debridement Performed for Assessment: Wound #1 Left,Lateral Lower Leg ?Performed By: Physician Kalman Shan, DO ?Debridement Type: Debridement ?Level of Consciousness (Pre-procedure): Awake and Alert ?Pre-procedure Verification/Time Out Yes - 09:28 ?Taken: ?Start Time: 09:29 ?Pain Control: ?Other : benzocaine 20% ?T Area Debrided (L x W): ?otal 6 (cm) x 6 (cm) = 36 (cm?) ?Tissue and other material debrided: Non-Viable, [place], [place], Subcutaneous, [place] ?Level: Skin/Subcutaneous Tissue ?Debridement Description: Excisional ?Instrument: Blade, Forceps ?Bleeding: Minimum ?Hemostasis Achieved: Pressure ?End Time: 09:36 ?Procedural Pain: 0 ?Post Procedural Pain: 0 ?Response to Treatment: Procedure was tolerated well ?Level of Consciousness (Post- Awake and  Alert ?procedure): ?Post Debridement Measurements of Total Wound ?Length: (cm) 14.5 ?Width: (cm) 17 ?Depth: (cm) 8.5 ?Volume: (cm?) 1645.606 ?Character of Wound/Ulcer Post Debridement: Improved ?Post Procedure Diagnosis ?Same as Pre-procedure ?Electronic Signature(s) ?Signed: 11/19/2021 10:07:36 AM By: Kalman Shan DO ?Signed: 11/19/2021 1:08:47 PM By: Deon Pilling RN, BSN ?Entered By: Deon Pilling on 11/19/2021 09:37:09 ?-------------------------------------------------------------------------------- ?HPI Details ?Patient Name: Date of Service: ?[name] 11/19/2021 9:00 A M ?Medical Record Number: [id number] ?Patient Account Number: [id number] ?Date of Birth/Sex: Treating RN: ?[birth year] (71 y.o. F) ?Primary Care Provider: Deland Pretty Other Clinician: ?Referring Provider: ?Treating Provider/Extender: Kalman Shan ?Deland Pretty ?Weeks in Treatment: 0 ?History of Present Illness ?HPI Description: Admission 11/19/2021 ?Ms. Jionna Spira is a 71 year old female with a past medical history of paroxysmal A-fib on Eliquis, hypothyroidism, major depressive disorder, venous ?insufficiency and chronic diastolic heart failure that presents to the clinic for a 1 month history of nonhealing wound to the left lower extremity. She visited the ?ED on 10/18/2021 after a mechanical fall. She developed a hematoma that subsequently opened. She was hospitalized for 7 days and discharged on 10/25/2021. ?She has been on several different antibiotics For the past month. She states that most recently she was on Levaquin and linezolid for the past week. She ?states she completes her antibiotic course tomorrow. She has been using Dakin's wet-to-dry dressings to the wound bed. She denies signs of infection. ?Electronic Signature(s) ?Signed: 11/19/2021 10:07:36 AM By: Kalman Shan DO ?Entered By: Kalman Shan on 11/19/2021  10:03:11 ?-------------------------------------------------------------------------------- ?Physical Exam Details ?Patient Name: Date of Service: ?[name] 11/19/2021 9:00 A M ?Medical Record Number: [id number] ?Patient Account Number: [id number] ?Date of Birth/Sex: Treating RN: ?1951-01-09 (71 y.o. F) ?Primary Care Provider: Deland Pretty Other Clinician: ?Referring Provider: ?Treating Provider/Extender: Kalman Shan ?Deland Pretty ?Weeks in Treatment: 0 ?Constitutional ?respirations regular, non-labored and within target range for patient.Marland Kitchen ?Cardiovascular ?2+ dorsalis pedis/posterior tibialis pulses. ?Psychiatric ?pleasant and cooperative. ?Notes ?Left lower extremity: T the anterior lateral aspect there is an open wound  with granulation tissue, nonviable fat and increased depth in the center to about 8 ?o ?cm. On palpation there was significant old blood that was excavated. There is bruising still throughout the leg. No signs of surrounding soft tissue infection. ?Electronic Signature(s) ?Signed: 11/19/2021 10:07:36 AM By: Kalman Shan DO ?Entered By: Kalman Shan on 11/19/2021 10:04:10 ?-------------------------------------------------------------------------------- ?Physician Orders Details ?Patient Name: ?Date of Service: ?[name] 11/19/2021 9:00 A M ?Medical Record Number: [id number] ?Patient Account Number: [id number] ?Date of Birth/Sex: ?Treating RN: ?[birth year] (71 y.o. F) ?Primary Care Provider: Deland Pretty ?Other Clinician: ?Referring Provider: ?Treating Provider/Extender: Kalman Shan ?Deland Pretty ?Weeks in Treatment: 0 ?Verbal / Phone Orders: No ?Diagnosis Coding ?ICD-10 Coding ?Code Description ?[id number] Non-pressure chronic ulcer of other part of left lower leg with fat layer exposed ?T79.8XXA Other early complications of trauma, initial encounter ?I48.0 Paroxysmal atrial fibrillation ?Z79.01 Long term (current) use of anticoagulants ?Follow-up  Appointments ?ppointment in 1 week. - Dr. Heber [place] and Tammi Klippel, Room 6 overflow ?Return A ?Bathing/ Shower/ Hygiene ?May shower with protection but do not get wound dressing(s) wet. ?Edema Control - Lymphedema / SCD / Other ?Elevate legs to the level of the heart or above for 30 minutes daily and/or when sitting, a frequency of: - 3-4 times a day throughout the day. ?Avoid standing for long periods of time. ?Home Health ?New wound care orders this week; continue Home Health for wound care. May utilize formulary equivalent dressing for wound treatment ?orders unless otherwise specified. - x2 a week dressing changes and all other days family member to change. ?Wound Treatment ?Wound #1 - Lower Leg Wound Laterality: Left, Lateral ?Cleanser: Wound Cleanser (Home Health) 2 x Per Day/30 Days ?Discharge Instructions: Cleanse the wound with wound cleanser prior to applying a clean dressing using gauze sponges, not tissue or cotton balls. ?Prim Dressing: Plain packing strip 1/4 (in) (Home Health) 2 x Per Day/30 Days ?ary ?Discharge Instructions: ***MOISTEN WITH DAKIN'S SOLUTION*** Lightly pack into TUNNEL. ?Prim Dressing: Dakin's Solution 0.25%, 16 (oz) (Home Health) 2 x Per Day/30 Days ?ary ?Discharge Instructions: Moisten gauze with Dakin's solution ?Secondary Dressing: Woven Gauze Sponge, Non-Sterile 4x4 in (Home Health) 2 x Per Day/30 Days ?Discharge Instructions: Apply over primary dressing as directed. ?Secondary Dressing: ABD Pad, 8x10 (Home Health) 2 x Per Day/30 Days ?Discharge Instructions: Apply over primary dressing as directed. ?Secondary Dressing: Zetuvit Plus 4x8 in [facility]) 2 x Per Day/30 Days ?Discharge Instructions: Apply over primary dressing as directed. ?Secured With: Elastic Bandage 4 inch (ACE bandage) (Home Health) 2 x Per Day/30 Days ?Discharge Instructions: Secure with ACE bandage as directed. ?Secured With: [employer], 4.5x3.1 (in/yd) (Home Health) 2 x Per Day/30 Days ?Discharge  Instructions: Secure with Kerlix as directed. ?Secured With: 25M Medipore H Soft Cloth Surgical T ape, 4 x 10 (in/yd) (Home Health) 2 x Per Day/30 Days ?Discharge Instructions: Secure with tape as directed. ?Patient Medications ?llergies: Augmentin ?A ?Notifications Medication Indication Start End ?11/19/2021 ?Dakin's Solution ?DO

## 2021-11-24 ENCOUNTER — Ambulatory Visit: Payer: PPO | Admitting: Internal Medicine

## 2021-11-26 ENCOUNTER — Encounter (HOSPITAL_BASED_OUTPATIENT_CLINIC_OR_DEPARTMENT_OTHER): Payer: PPO | Attending: Internal Medicine | Admitting: Internal Medicine

## 2021-11-26 DIAGNOSIS — L97822 Non-pressure chronic ulcer of other part of left lower leg with fat layer exposed: Secondary | ICD-10-CM | POA: Insufficient documentation

## 2021-11-26 DIAGNOSIS — I480 Paroxysmal atrial fibrillation: Secondary | ICD-10-CM | POA: Diagnosis not present

## 2021-11-26 DIAGNOSIS — I5032 Chronic diastolic (congestive) heart failure: Secondary | ICD-10-CM | POA: Insufficient documentation

## 2021-11-26 DIAGNOSIS — W19XXXA Unspecified fall, initial encounter: Secondary | ICD-10-CM | POA: Insufficient documentation

## 2021-11-26 DIAGNOSIS — T798XXA Other early complications of trauma, initial encounter: Secondary | ICD-10-CM | POA: Diagnosis not present

## 2021-11-26 DIAGNOSIS — Z7901 Long term (current) use of anticoagulants: Secondary | ICD-10-CM | POA: Diagnosis not present

## 2021-11-26 NOTE — Progress Notes (Signed)
[name] ([id number]) ?Visit Report for 11/26/2021 ?Chief Complaint Document Details ?Patient Name: Date of Service: ?Christine Debar [name] 11/26/2021 10:45 A M ?Medical Record Number: [id number] ?Patient Account Number: [id number] ?Date of Birth/Sex: Treating RN: ?1951/05/31 (71 y.o. F) ?Primary Care Provider: Deland Pretty Other Clinician: ?Referring Provider: ?Treating Provider/Extender: Kalman Shan ?Deland Pretty ?Weeks in Treatment: 1 ?Information Obtained from: Patient ?Chief Complaint ?11/19/2021; Left lower extremity wound status post fall ?Electronic Signature(s) ?Signed: 11/26/2021 12:38:30 PM By: Kalman Shan DO ?Entered By: Kalman Shan on 11/26/2021 12:01:28 ?-------------------------------------------------------------------------------- ?Debridement Details ?Patient Name: Date of Service: ?Christine Debar [name] 11/26/2021 10:45 A M ?Medical Record Number: [id number] ?Patient Account Number: [id number] ?Date of Birth/Sex: Treating RN: ?[birth year] (71 y.o. [name] ?Primary Care Provider: Deland Pretty Other Clinician: ?Referring Provider: ?Treating Provider/Extender: Kalman Shan ?Deland Pretty ?Weeks in Treatment: 1 ?Debridement Performed for Assessment: Wound #1 Left,Lateral Lower Leg ?Performed By: Physician Kalman Shan, DO ?Debridement Type: Debridement ?Level of Consciousness (Pre-procedure): Awake and Alert ?Pre-procedure Verification/Time Out Yes - 11:29 ?Taken: ?Start Time: 11:30 ?Pain Control: ?Other : Benzocaine ?T Area Debrided (L x W): ?otal 5 (cm) x 5 (cm) = 25 (cm?) ?Tissue and other material debrided: Non-Viable, Fat, [place], Subcutaneous, [place] ?Level: Skin/Subcutaneous Tissue ?Debridement Description: Excisional ?Instrument: Curette ?Bleeding: Moderate ?Hemostasis Achieved: Silver Nitrate ?End Time: 11:36 ?Response to Treatment: Procedure was tolerated well ?Level of Consciousness (Post- Awake and Alert ?procedure): ?Post Debridement Measurements of Total  Wound ?Length: (cm) 13.6 ?Width: (cm) 14 ?Depth: (cm) 8 ?Volume: (cm?) 1196.318 ?Character of Wound/Ulcer Post Debridement: Stable ?Post Procedure Diagnosis ?Same as Pre-procedure ?Electronic Signature(s) ?Signed: 11/26/2021 12:38:30 PM By: Kalman Shan DO ?Signed: 11/26/2021 1:26:53 PM By: Lorrin Jackson ?Entered By: Lorrin Jackson on 11/26/2021 11:37:13 ?-------------------------------------------------------------------------------- ?HPI Details ?Patient Name: Date of Service: ?Christine Debar [name] 11/26/2021 10:45 A M ?Medical Record Number: [id number] ?Patient Account Number: [id number] ?Date of Birth/Sex: Treating RN: ?04-03-1951 (71 y.o. F) ?Primary Care Provider: Deland Pretty Other Clinician: ?Referring Provider: ?Treating Provider/Extender: Kalman Shan ?Deland Pretty ?Weeks in Treatment: 1 ?History of Present Illness ?HPI Description: Admission 11/19/2021 ?[name] is a 71 year old female with a past medical history of paroxysmal A-fib on Eliquis, hypothyroidism, major depressive disorder, venous ?insufficiency and chronic diastolic heart failure that presents to the clinic for a 1 month history of nonhealing wound to the left lower extremity. She visited the ?ED on 10/18/2021 after a mechanical fall. She developed a hematoma that subsequently opened. She was hospitalized for 7 days and discharged on 10/25/2021. ?She has been on several different antibiotics For the past month. She states that most recently she was on Levaquin and linezolid for the past week. She ?states she completes her antibiotic course tomorrow. She has been using Dakin's wet-to-dry dressings to the wound bed. She denies signs of infection. ?4/7; patient presents for follow-up. She has been using Dakin's wet-to-dry dressings. She did end up going to the ED on 4/1 because she had excess bleeding ?with dressing change that she could not stop. She is on Eliquis for A-fib. In the ED they tied off a small artery. She has had Christine  issues since discharge. She ?denies signs of infection. ?Electronic Signature(s) ?Signed: 11/26/2021 12:38:30 PM By: Kalman Shan DO ?Entered By: Kalman Shan on 11/26/2021 12:02:26 ?-------------------------------------------------------------------------------- ?Physical Exam Details ?Patient Name: Date of Service: ?Christine Debar [name] 11/26/2021 10:45 A M ?Medical Record Number: [id number] ?Patient Account Number: [id number] ?Date of Birth/Sex: Treating RN: ?1951-01-17 (71 y.o. F) ?Primary  Care Provider: Deland Pretty Other Clinician: ?Referring Provider: ?Treating Provider/Extender: Kalman Shan ?Deland Pretty ?Weeks in Treatment: 1 ?Constitutional ?respirations regular, non-labored and within target range for patient.Marland Kitchen ?Cardiovascular ?2+ dorsalis pedis/posterior tibialis pulses. ?Psychiatric ?pleasant and cooperative. ?Notes ?Left lower extremity: T the anterior lateral aspect there is an open wound with granulation tissue, nonviable fat and increased depth in the center. Christine signs of ?o ?surrounding soft tissue infection. ?Electronic Signature(s) ?Signed: 11/26/2021 12:38:30 PM By: Kalman Shan DO ?Entered By: Kalman Shan on 11/26/2021 12:03:43 ?-------------------------------------------------------------------------------- ?Physician Orders Details ?Patient Name: ?Date of Service: ?Christine [name] 11/26/2021 10:45 A M ?Medical Record Number: [id number] ?Patient Account Number: [id number] ?Date of Birth/Sex: ?Treating RN: ?1951/04/11 (71 y.o. [name] ?Primary Care Provider: Deland Pretty ?Other Clinician: ?Referring Provider: ?Treating Provider/Extender: Kalman Shan ?Deland Pretty ?Weeks in Treatment: 1 ?Verbal / Phone Orders: Christine ?Diagnosis Coding ?ICD-10 Coding ?Code Description ?[id number] Non-pressure chronic ulcer of other part of left lower leg with fat layer exposed ?T79.8XXA Other early complications of trauma, initial encounter ?I48.0 Paroxysmal atrial fibrillation ?Z79.01  Long term (current) use of anticoagulants ?Follow-up Appointments ?ppointment in 1 week. - Dr. Heber [place] and Tammi Klippel ?Return A ?Bathing/ Shower/ Hygiene ?May shower with protection but do not get wound dressing(s) wet. ?Edema Control - Lymphedema / SCD / Other ?Elevate legs to the level of the heart or above for 30 minutes daily and/or when sitting, a frequency of: - 3-4 times a day throughout the day. ?Avoid standing for long periods of time. ?Home Health ?Christine change in wound care orders this week; continue Home Health for wound care. May utilize formulary equivalent dressing for wound ?treatment orders unless otherwise specified. - x2 a week dressing changes and all other days family member to change. ?Other Home Health Orders/Instructions: - [facility] ?Wound Treatment ?Wound #1 - Lower Leg Wound Laterality: Left, Lateral ?Cleanser: Wound Cleanser (Home Health) 2 x Per Day/30 Days ?Discharge Instructions: Cleanse the wound with wound cleanser prior to applying a clean dressing using gauze sponges, not tissue or cotton balls. ?Prim Dressing: Plain packing strip 1/2 (in) 2 x Per Day/30 Days ?ary ?Discharge Instructions: Lightly pack as instructed. May use 1" or conform gauze ?Prim Dressing: Dakin's Solution 0.25%, 16 (oz) (Home Health) 2 x Per Day/30 Days ?ary ?Discharge Instructions: Moisten gauze with Dakin's solution ?Secondary Dressing: Woven Gauze Sponge, Non-Sterile 4x4 in (Home Health) 2 x Per Day/30 Days ?Discharge Instructions: Apply over primary dressing as directed. ?Secondary Dressing: ABD Pad, 8x10 (Home Health) 2 x Per Day/30 Days ?Discharge Instructions: Apply over primary dressing as directed. ?Secondary Dressing: Zetuvit Plus 4x8 in [facility]) 2 x Per Day/30 Days ?Discharge Instructions: Apply over primary dressing as directed. ?Secured With: Elastic Bandage 4 inch (ACE bandage) (Home Health) 2 x Per Day/30 Days ?Discharge Instructions: Secure with ACE bandage as directed. ?Secured With: [employer], 4.5x3.1 (in/yd) (Home Health) 2 x Per Day/30 Days ?Discharge Instructions: Secure with Kerlix as directed. ?Secured With: 82M Medipore H Soft Cloth Surgical T ape, 4 x 10 (in/yd) (Home Health) 2

## 2021-11-26 NOTE — Progress Notes (Signed)
LASHEA, RUNGE ([id number]) ?Visit Report for 11/26/2021 ?Arrival Information Details ?Patient Name: Date of Service: ?Christine Debar [name] 11/26/2021 10:45 A M ?Medical Record Number: [id number] ?Patient Account Number: [id number] ?Date of Birth/Sex: Treating RN: ?[birth year] (71 y.o. [name] ?Primary Care Robie Oats: Deland Pretty Other Clinician: ?Referring Jeferson Boozer: ?Treating Emil Klassen/Extender: Kalman Shan ?Deland Pretty ?Weeks in Treatment: 1 ?Visit Information History Since Last Visit ?Added or deleted any medications: No ?Patient Arrived: Wheel Chair ?Any new allergies or adverse reactions: No ?Arrival Time: 11:01 ?Had a fall or experienced change in No ?Accompanied By: Son, daughter in law ?activities of daily living that may affect ?Transfer Assistance: Manual ?risk of falls: ?Patient Identification Verified: Yes ?Signs or symptoms of abuse/neglect since last visito No ?Secondary Verification Process Completed: Yes ?Hospitalized since last visit: No ?Patient Requires Transmission-Based Precautions: No ?Implantable device outside of the clinic excluding No ?Patient Has Alerts: Yes ?cellular tissue based products placed in the center ?Patient Alerts: Patient on Blood Thinner since last visit: ?Has Dressing in Place as Prescribed: Yes ?Pain Present Now: No ?Electronic Signature(s) ?Signed: 11/26/2021 1:26:53 PM By: Lorrin Jackson ?Entered By: Lorrin Jackson on 11/26/2021 11:07:24 ?-------------------------------------------------------------------------------- ?Encounter Discharge Information Details ?Patient Name: Date of Service: ?Christine Debar [name] 11/26/2021 10:45 A M ?Medical Record Number: [id number] ?Patient Account Number: [id number] ?Date of Birth/Sex: Treating RN: ?1951/07/09 (71 y.o. [name] ?Primary Care Zyrah Wiswell: Deland Pretty Other Clinician: ?Referring Novalie Leamy: ?Treating Adilyn Humes/Extender: Kalman Shan ?Deland Pretty ?Weeks in Treatment: 1 ?Encounter Discharge Information  Items Post Procedure Vitals ?Discharge Condition: Stable ?Temperature (F): 98.1 ?Ambulatory Status: Wheelchair ?Pulse (bpm): 54 ?Discharge Destination: Home ?Respiratory Rate (breaths/min): 20 ?Transportation: Private Auto ?Blood Pressure (mmHg): 126/57 ?Accompanied By: Pandora Leiter, daughter in law ?Schedule Follow-up Appointment: Yes ?Clinical Summary of Care: Provided on 11/26/2021 ?Form Type Recipient ?Paper Patient Patient ?Electronic Signature(s) ?Signed: 11/26/2021 12:33:48 PM By: Lorrin Jackson ?Entered By: Lorrin Jackson on 11/26/2021 12:33:47 ?-------------------------------------------------------------------------------- ?Lower Extremity Assessment Details ?Patient Name: ?Date of Service: ?NO [name] [name] 11/26/2021 10:45 A M ?Medical Record Number: [id number] ?Patient Account Number: [id number] ?Date of Birth/Sex: ?Treating RN: ?December 08, 1950 (71 y.o. [name] ?Primary Care Jessa Stinson: Deland Pretty ?Other Clinician: ?Referring Ryley Bachtel: ?Treating Samiha Denapoli/Extender: Kalman Shan ?Deland Pretty ?Weeks in Treatment: 1 ?Edema Assessment ?Assessed: [Left: Yes] [Right: No] ?Edema: [Left: Ye] [Right: s] ?Calf ?Left: Right: ?Point of Measurement: 31 cm From Medial Instep 58 cm ?Ankle ?Left: Right: ?Point of Measurement: 9 cm From Medial Instep 25.5 cm ?Vascular Assessment ?Pulses: ?Dorsalis Pedis ?Palpable: [Left:Yes] ?Electronic Signature(s) ?Signed: 11/26/2021 1:26:53 PM By: Lorrin Jackson ?Entered By: Lorrin Jackson on 11/26/2021 11:19:06 ?-------------------------------------------------------------------------------- ?Multi Wound Chart Details ?Patient Name: ?Date of Service: ?NO [name] [name] 11/26/2021 10:45 A M ?Medical Record Number: [id number] ?Patient Account Number: [id number] ?Date of Birth/Sex: ?Treating RN: ?1951/01/09 (71 y.o. F) ?Primary Care Kawana Hegel: Deland Pretty ?Other Clinician: ?Referring Caylah Plouff: ?Treating Anastasha Ortez/Extender: Kalman Shan ?Deland Pretty ?Weeks in Treatment: 1 ?Vital  Signs ?Height(in): 67 ?Pulse(bpm): 54 ?Weight(lbs): 340 ?Blood Pressure(mmHg): 126/57 ?Body Mass Index(BMI): 53.2 ?Temperature(??F): 98.1 ?Respiratory Rate(breaths/min): 20 ?Photos: [1:Left, Lateral Lower Leg] [N/A:N/A N/A] ?Wound Location: [1:Trauma] [N/A:N/A] ?Wounding Event: [1:Trauma, Other] [N/A:N/A] ?Primary Etiology: [1:Congestive Heart Failure,] [N/A:N/A] ?Comorbid History: [1:Hypertension 10/18/2021] [N/A:N/A] ?Date Acquired: [1:1] [N/A:N/A] ?Weeks of Treatment: [1:Open] [N/A:N/A] ?Wound Status: [1:No] [N/A:N/A] ?Wound Recurrence: [1:Yes] [N/A:N/A] ?Clustered Wound: [1:2] [N/A:N/A] ?Clustered Quantity: [1:13.6x14x8] [N/A:N/A] ?Measurements L x W x D (cm) [1:149.54] [N/A:N/A] ?A (cm?) : ?rea [1:1196.318] [N/A:N/A] ?Volume (cm?) : [1:22.80%] [N/A:N/A] ?% Reduction in A [  1:rea: 27.30%] [N/A:N/A] ?% Reduction in Volume: [1:Full Thickness Without Exposed] [N/A:N/A] ?Classification: [1:Support Structures Large] [N/A:N/A] ?Exudate A mount: [1:Sanguinous] [N/A:N/A] ?Exudate Type: [1:red] [N/A:N/A] ?Exudate Color: [1:Distinct, outline attached] [N/A:N/A] ?Wound Margin: [1:Medium (34-66%)] [N/A:N/A] ?Granulation A mount: [1:Red, Pink] [N/A:N/A] ?Granulation Quality: [1:Medium (34-66%)] [N/A:N/A] ?Necrotic A mount: [1:Eschar, Adherent Slough] [N/A:N/A] ?Necrotic Tissue: ?[1:Fat Layer (Subcutaneous Tissue): Yes N/A] ?Exposed Structures: ?[1:Fascia: No Tendon: No Muscle: No Joint: No Bone: No None] [N/A:N/A] ?Epithelialization: [1:Debridement - Excisional] [N/A:N/A] ?Debridement: ?Pre-procedure Verification/Time Out 11:29 [N/A:N/A] ?Taken: [1:Other] [N/A:N/A] ?Pain Control: [1:Fat, Subcutaneous, Slough] [N/A:N/A] ?Tissue Debrided: [1:Skin/Subcutaneous Tissue] [N/A:N/A] ?Level: [1:25] [N/A:N/A] ?Debridement A (sq cm): [1:rea Curette] [N/A:N/A] ?Instrument: [1:Moderate] [N/A:N/A] ?Bleeding: [1:Silver Nitrate] [N/A:N/A] ?Hemostasis A chieved: [1:Procedure was tolerated well] [N/A:N/A] ?Debridement Treatment Response:  [1:13.6x14x8] [N/A:N/A] ?Post Debridement Measurements L x ?W x D (cm) [id number] [N/A:N/A] ?Post Debridement Volume: (cm?) [1:Debridement] [N/A:N/A] ?Treatment Notes ?Electronic Signature(s) ?Signed: 11/26/2021 12:38:30 PM By: Kalman Shan DO ?Entered By: Kalman Shan on 11/26/2021 12:01:14 ?-------------------------------------------------------------------------------- ?Multi-Disciplinary Care Plan Details ?Patient Name: ?Date of Service: ?NO [name] [name] 11/26/2021 10:45 A M ?Medical Record Number: [id number] ?Patient Account Number: [id number] ?Date of Birth/Sex: ?Treating RN: ?07-17-1951 (71 y.o. [name] ?Primary Care Ioane Bhola: Deland Pretty ?Other Clinician: ?Referring Undrea Archbold: ?Treating Thanh Pomerleau/Extender: Kalman Shan ?Deland Pretty ?Weeks in Treatment: 1 ?Active Inactive ?Abuse / Safety / Falls / Self Care Management ?Nursing Diagnoses: ?History of Falls ?Potential for falls ?Goals: ?Patient/caregiver will verbalize/demonstrate measure taken to improve self care ?Date Initiated: 11/19/2021 ?Target Resolution Date: 01/14/2022 ?Goal Status: Active ?Patient/caregiver will verbalize/demonstrate measures taken to prevent injury and/or falls ?Date Initiated: 11/19/2021 ?Target Resolution Date: 11/26/2021 ?Goal Status: Active ?Interventions: ?Provide education on basic hygiene ?Provide education on fall prevention ?Provide education on HBO safety ?Provide education on vaccinations ?Notes: ?Necrotic Tissue ?Nursing Diagnoses: ?Impaired tissue integrity related to necrotic/devitalized tissue ?Goals: ?Necrotic/devitalized tissue will be minimized in the wound bed ?Date Initiated: 11/19/2021 ?Target Resolution Date: 11/26/2021 ?Goal Status: Active ?Patient/caregiver will verbalize understanding of reason and process for debridement of necrotic tissue ?Date Initiated: 11/19/2021 ?Target Resolution Date: 11/26/2021 ?Goal Status: Active ?Interventions: ?Assess patient pain level pre-, during and post  procedure and prior to discharge ?Provide education on necrotic tissue and debridement process ?Treatment Activities: ?Apply topical anesthetic as ordered : 11/19/2021 ?Excisional debridement : 11/19/2021 ?Notes: ?Rich Number

## 2021-11-30 ENCOUNTER — Encounter: Payer: Self-pay | Admitting: Internal Medicine

## 2021-11-30 DIAGNOSIS — I4891 Unspecified atrial fibrillation: Secondary | ICD-10-CM | POA: Diagnosis not present

## 2021-11-30 DIAGNOSIS — T24032D Burn of unspecified degree of left lower leg, subsequent encounter: Secondary | ICD-10-CM | POA: Diagnosis not present

## 2021-11-30 DIAGNOSIS — U071 COVID-19: Secondary | ICD-10-CM | POA: Diagnosis not present

## 2021-11-30 DIAGNOSIS — Z96653 Presence of artificial knee joint, bilateral: Secondary | ICD-10-CM | POA: Diagnosis not present

## 2021-11-30 DIAGNOSIS — Z9181 History of falling: Secondary | ICD-10-CM | POA: Diagnosis not present

## 2021-11-30 DIAGNOSIS — N189 Chronic kidney disease, unspecified: Secondary | ICD-10-CM | POA: Diagnosis not present

## 2021-11-30 DIAGNOSIS — I5032 Chronic diastolic (congestive) heart failure: Secondary | ICD-10-CM | POA: Diagnosis not present

## 2021-11-30 DIAGNOSIS — E039 Hypothyroidism, unspecified: Secondary | ICD-10-CM | POA: Diagnosis not present

## 2021-11-30 DIAGNOSIS — F325 Major depressive disorder, single episode, in full remission: Secondary | ICD-10-CM | POA: Diagnosis not present

## 2021-11-30 DIAGNOSIS — I130 Hypertensive heart and chronic kidney disease with heart failure and stage 1 through stage 4 chronic kidney disease, or unspecified chronic kidney disease: Secondary | ICD-10-CM | POA: Diagnosis not present

## 2021-12-02 DIAGNOSIS — N189 Chronic kidney disease, unspecified: Secondary | ICD-10-CM | POA: Diagnosis not present

## 2021-12-02 DIAGNOSIS — I4891 Unspecified atrial fibrillation: Secondary | ICD-10-CM | POA: Diagnosis not present

## 2021-12-02 DIAGNOSIS — F325 Major depressive disorder, single episode, in full remission: Secondary | ICD-10-CM | POA: Diagnosis not present

## 2021-12-02 DIAGNOSIS — U071 COVID-19: Secondary | ICD-10-CM | POA: Diagnosis not present

## 2021-12-02 DIAGNOSIS — I130 Hypertensive heart and chronic kidney disease with heart failure and stage 1 through stage 4 chronic kidney disease, or unspecified chronic kidney disease: Secondary | ICD-10-CM | POA: Diagnosis not present

## 2021-12-02 DIAGNOSIS — E039 Hypothyroidism, unspecified: Secondary | ICD-10-CM | POA: Diagnosis not present

## 2021-12-02 DIAGNOSIS — Z96653 Presence of artificial knee joint, bilateral: Secondary | ICD-10-CM | POA: Diagnosis not present

## 2021-12-02 DIAGNOSIS — I5032 Chronic diastolic (congestive) heart failure: Secondary | ICD-10-CM | POA: Diagnosis not present

## 2021-12-02 DIAGNOSIS — Z9181 History of falling: Secondary | ICD-10-CM | POA: Diagnosis not present

## 2021-12-02 DIAGNOSIS — T24032D Burn of unspecified degree of left lower leg, subsequent encounter: Secondary | ICD-10-CM | POA: Diagnosis not present

## 2021-12-02 NOTE — Telephone Encounter (Signed)
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  She will discuss with the wound doctor as to whether there are alternatives, whether there is some potential benefit of a wound VAC for a shorter period of time etc. and let us know ?

## 2021-12-03 ENCOUNTER — Encounter (HOSPITAL_BASED_OUTPATIENT_CLINIC_OR_DEPARTMENT_OTHER): Payer: PPO | Admitting: Internal Medicine

## 2021-12-03 DIAGNOSIS — L97822 Non-pressure chronic ulcer of other part of left lower leg with fat layer exposed: Secondary | ICD-10-CM | POA: Diagnosis not present

## 2021-12-03 NOTE — Progress Notes (Signed)
LILER, PANTER ([id number]) ?Visit Report for 12/03/2021 ?HPI Details ?Patient Name: Date of Service: ?[name] ?Medical Record Number: [id number] ?Patient Account Number: [id number] ?Date of Birth/Sex: Treating RN: ?09/28/1950 (71 y.o. [name] ?Primary Care Provider: Deland [name] Other Clinician: ?Referring Provider: ?Treating Provider/Extender: [name] ?[name] ?Weeks in Treatment: 2 ?History of Present Illness ?HPI Description: Admission 11/19/2021 ?[name] is a 71 year old female with a past medical history of paroxysmal A-fib on Eliquis, hypothyroidism, major depressive disorder, venous ?insufficiency and chronic diastolic heart failure that presents to the clinic for a 1 month history of nonhealing wound to the left lower extremity. She visited the ?ED on 10/18/2021 after a mechanical fall. She developed a hematoma that subsequently opened. She was hospitalized for 7 days and discharged on 10/25/2021. ?She has been on several different antibiotics For the past month. She states that most recently she was on Levaquin and linezolid for the past week. She ?states she completes her antibiotic course tomorrow. She has been using Dakin's wet-to-dry dressings to the wound bed. She denies signs of infection. ?4/7; patient presents for follow-up. She has been using Dakin's wet-to-dry dressings. She did end up going to the ED on 4/1 because she had excess bleeding ?with dressing change that she could not stop. She is on Eliquis for A-fib. In the ED they tied off a small artery. She has had no issues since discharge. She ?denies signs of infection. ?4/14; this is a very difficult clinical situation. A patient with underlying chronic venous insufficiency and lymphedema very significant lower extremity edema ?had a hematoma after a fall on her left upper lateral lower leg. She is on Eliquis for atrial fibrillation apparently with a history of a splenic  infarct following with ?[name] of cardiology. She has exhibited significant bleeding from the wound surface including ao Venous bleeder that required suturing short while ago. ?She has been using Dakin's wet-to-dry packing and over the surface of the wound. ?She saw [name] yesterday he is reluctant to consider stopping the Eliquis because of the prior history of presumed cardioembolism. Wants to communicate ?with [name] when she returns. In a perfect world where she was not on Eliquis she requires a wound VAC with additional compression wraps but I ?understand the reluctance to do this because of the concerns of bleeding ?Electronic Signature(s) ?Signed: 12/03/2021 12:21:32 PM By: Christine Ham MD ?Entered By: [name] on 12/03/2021 10:11:08 ?-------------------------------------------------------------------------------- ?Physical Exam Details ?Patient Name: Date of Service: ?[name] ?Medical Record Number: [id number] ?Patient Account Number: [id number] ?Date of Birth/Sex: Treating RN: ?1951-05-15 (71 y.o. [name] ?Primary Care Provider: Deland [name] Other Clinician: ?Referring Provider: ?Treating Provider/Extender: [name] ?[name] ?Weeks in Treatment: 2 ?Constitutional ?Wide pulse pressure. Pulse regular and within target range for patient.Marland Kitchen Respirations regular, non-labored and within target range.. Temperature is normal and ?within the target range for the patient.Marland Kitchen Appears in no distress. ?Cardiovascular ?Uncontrolled edema in the left lower leg. No evidence of infection. ?Notes ?Wound exam; left lower extremity extensive wound on the left lateral mid aspect of her calf. Surface of the wound actually looks quite good however centrally ?she has a deep recess that goes down about 7 cm with very significant tunneling and undermining at the bottom of this. What I can see of the tissue looks ?fairly healthy no bleeding however with even  manipulation of the tunneled area there is  scant serosanguineous discharge. There is no evidence of surrounding ?infection ?Electronic Signature(s) ?Signed: 12/03/2021 12:21:32 PM By: Christine Ham MD ?Entered By: [name] on 12/03/2021 10:13:01 ?-------------------------------------------------------------------------------- ?Physician Orders Details ?Patient Name: ?Date of Service: ?[name] 12/03/2021 9:30 A [name] ?Medical Record Number: [id number] ?Patient Account Number: [id number] ?Date of Birth/Sex: ?Treating RN: ?April 23, 1951 (71 y.o. [name] ?Primary Care Provider: Deland [name] ?Other Clinician: ?Referring Provider: ?Treating Provider/Extender: [name] ?[name] ?Weeks in Treatment: 2 ?Verbal / Phone Orders: No ?Diagnosis Coding ?ICD-10 Coding ?Code Description ?[id number] Non-pressure chronic ulcer of other part of left lower leg with fat layer exposed ?T79.8XXA Other early complications of trauma, initial encounter ?I48.0 Paroxysmal atrial fibrillation ?Z79.01 Long term (current) use of anticoagulants ?Follow-up Appointments ?ppointment in 2 weeks. - Dr. Heber Grace and [name] ?Return A ?Bathing/ Shower/ Hygiene ?May shower with protection but do not get wound dressing(s) wet. ?Edema Control - Lymphedema / SCD / Other ?Elevate legs to the level of the heart or above for 30 minutes daily and/or when sitting, a frequency of: - 3-4 times a day throughout the day. ?Avoid standing for long periods of time. ?Home Health ?No change in wound care orders this week; continue Home Health for wound care. May utilize formulary equivalent dressing for wound ?treatment orders unless otherwise specified. - 2x a week dressing changes and all other days family member to change. ?Other Home Health Orders/Instructions: - [facility] ?Wound Treatment ?Wound #1 - Lower Leg Wound Laterality: Left, Lateral ?Cleanser: Wound Cleanser (Home Health) 2 x Per Day/30 Days ?Discharge Instructions: Cleanse  the wound with wound cleanser prior to applying a clean dressing using gauze sponges, not tissue or cotton balls. ?Prim Dressing: Plain packing strip 1/2 (in) 2 x Per Day/30 Days ?ary ?Discharge Instructions: Lightly pack as instructed. May use 1" or conform gauze ?Prim Dressing: Dakin's Solution 0.25%, 16 (oz) (Home Health) 2 x Per Day/30 Days ?ary ?Discharge Instructions: Moisten gauze with Dakin's solution ?Secondary Dressing: ABD Pad, 8x10 (Home Health) 2 x Per Day/30 Days ?Discharge Instructions: Apply over primary dressing as directed. ?Secondary Dressing: Woven Gauze Sponge, Non-Sterile 4x4 in (Home Health) 2 x Per Day/30 Days ?Discharge Instructions: Apply over primary dressing as directed. ?Secondary Dressing: Zetuvit Plus 4x8 in [facility]) 2 x Per Day/30 Days ?Discharge Instructions: Apply over primary dressing as directed. ?Secured With: Elastic Bandage 4 inch (ACE bandage) (Home Health) 2 x Per Day/30 Days ?Discharge Instructions: Secure with ACE bandage as directed. ?Secured With: [employer], 4.5x3.1 (in/yd) (Home Health) 2 x Per Day/30 Days ?Discharge Instructions: Secure with Kerlix as directed. ?Secured With: 7M Medipore H Soft Cloth Surgical T ape, 4 x 10 (in/yd) (Home Health) 2 x Per Day/30 Days ?Discharge Instructions: Secure with tape as directed. ?Electronic Signature(s) ?Signed: 12/03/2021 12:21:32 PM By: Christine Ham MD ?Signed: 12/03/2021 1:03:04 PM By: Lorrin Jackson ?Entered By: Lorrin Jackson on 12/03/2021 09:55:33 ?-------------------------------------------------------------------------------- ?Problem List Details ?Patient Name: ?Date of Service: ?[name] 12/03/2021 9:30 A [name] ?Medical Record Number: [id number] ?Patient Account Number: [id number] ?Date of Birth/Sex: ?Treating RN: ?05-Sep-1950 (71 y.o. [name] ?Primary Care Provider: Deland [name] ?Other Clinician: ?Referring Provider: ?Treating Provider/Extender: [name] ?[name] ?Weeks in  Treatment: 2 ?Active Problems ?ICD-10 ?Encounter ?Code Description Active Date MDM ?Diagnosis ?[id number] Non-pressure chronic ulcer of other part of left lower leg with fat layer exposed3/31/2023 No Yes ?T79.8X

## 2021-12-03 NOTE — Progress Notes (Signed)
[name], [name] ([id number]) ?Visit Report for 12/03/2021 ?Arrival Information Details ?Patient Name: Date of Service: ?Christine Rolling Christine J. 12/03/2021 9:30 A M ?Medical Record Number: [id number] ?Patient Account Number: [id number] ?Date of Birth/Sex: Treating RN: ?[birth year] (71 y.o. F) Deaton, Bobbi ?Primary Care Akiba Melfi: [name] Other Clinician: ?Referring Treyvin Glidden: ?Treating Amandamarie Feggins/Extender: Baltazar Najjar ?[name] ?Weeks in Treatment: 2 ?Visit Information History Since Last Visit ?Added or deleted any medications: No ?Patient Arrived: Wheel Chair ?Any new allergies or adverse reactions: No ?Arrival Time: 09:23 ?Had a fall or experienced change in No ?Accompanied By: self ?activities of daily living that may affect ?Transfer Assistance: Manual ?risk of falls: ?Secondary Verification Process Completed: Yes ?Signs or symptoms of abuse/neglect since last visito No ?Patient Requires Transmission-Based Precautions: No ?Hospitalized since last visit: No ?Patient Has Alerts: Yes ?Implantable device outside of the clinic excluding No ?Patient Alerts: Patient on Blood Thinner ?cellular tissue based products placed in the center ?since last visit: ?Has Dressing in Place as Prescribed: Yes ?Pain Present Now: No ?Electronic Signature(s) ?Signed: 12/03/2021 1:08:25 PM By: Shawn Stall RN, BSN ?Entered By: Shawn Stall on 12/03/2021 09:24:44 ?-------------------------------------------------------------------------------- ?Clinic Level of Care Assessment Details ?Patient Name: Date of Service: ?Christine Rolling Christine J. 12/03/2021 9:30 A M ?Medical Record Number: [id number] ?Patient Account Number: [id number] ?Date of Birth/Sex: Treating RN: ?[birth year] (71 y.o. Roel Cluck ?Primary Care Adriannah Steinkamp: [name] Other Clinician: ?Referring Mylinh Cragg: ?Treating Kenyetta Wimbish/Extender: Baltazar Najjar ?[name] ?Weeks in Treatment: 2 ?Clinic Level of Care Assessment Items ?TOOL 4 Quantity Score ?X- 1 0 ?Use when  only an EandM is performed on FOLLOW-UP visit ?ASSESSMENTS - Nursing Assessment / Reassessment ?X- 1 10 ?Reassessment of Co-morbidities (includes updates in patient status) ?X- 1 5 ?Reassessment of Adherence to Treatment Plan ?ASSESSMENTS - Wound and Skin A ssessment / Reassessment ?X - Simple Wound Assessment / Reassessment - one wound 1 5 ?[]  - 0 ?Complex Wound Assessment / Reassessment - multiple wounds ?[]  - 0 ?Dermatologic / Skin Assessment (not related to wound area) ?ASSESSMENTS - Focused Assessment ?[]  - 0 ?Circumferential Edema Measurements - multi extremities ?[]  - 0 ?Nutritional Assessment / Counseling / Intervention ?[]  - 0 ?Lower Extremity Assessment (monofilament, tuning fork, pulses) ?[]  - 0 ?Peripheral Arterial Disease Assessment (using hand held doppler) ?ASSESSMENTS - Ostomy and/or Continence Assessment and Care ?[]  - 0 ?Incontinence Assessment and Management ?[]  - 0 ?Ostomy Care Assessment and Management (repouching, etc.) ?PROCESS - Coordination of Care ?[]  - 0 ?Simple Patient / Family Education for ongoing care ?X- 1 20 ?Complex (extensive) Patient / Family Education for ongoing care ?X- 1 10 ?Staff obtains Consents, Records, T Results / Process Orders ?est ?X- 1 10 ?Staff telephones HHA, Nursing Homes / Clarify orders / etc ?[]  - 0 ?Routine Transfer to another Facility (non-emergent condition) ?[]  - 0 ?Routine Hospital Admission (non-emergent condition) ?[]  - 0 ?New Admissions / / Ordering NPWT Apligraf, etc. ?, ?[]  - 0 ?Emergency Hospital Admission (emergent condition) ?[]  - 0 ?Simple Discharge Coordination ?[]  - 0 ?Complex (extensive) Discharge Coordination ?PROCESS - Special Needs ?[]  - 0 ?Pediatric / Minor Patient Management ?[]  - 0 ?Isolation Patient Management ?[]  - 0 ?Hearing / Language / Visual special needs ?[]  - 0 ?Assessment of Community assistance (transportation, D/C planning, etc.) ?[]  - 0 ?Additional assistance / Altered mentation ?[]  - 0 ?Support  Surface(s) Assessment (bed, cushion, seat, etc.) ?INTERVENTIONS - Wound Cleansing / Measurement ?X - Simple Wound Cleansing - one wound 1 5 ?[]  - 0 ?  Complex Wound Cleansing - multiple wounds ?X- 1 5 ?Wound Imaging (photographs - any number of wounds) ?[]  - 0 ?Wound Tracing (instead of photographs) ?X- 1 5 ?Simple Wound Measurement - one wound ?[]  - 0 ?Complex Wound Measurement - multiple wounds ?INTERVENTIONS - Wound Dressings ?[]  - 0 ?Small Wound Dressing one or multiple wounds ?[]  - 0 ?Medium Wound Dressing one or multiple wounds ?X- 1 20 ?Large Wound Dressing one or multiple wounds ?[]  - 0 ?Application of Medications - topical ?[]  - 0 ?Application of Medications - injection ?INTERVENTIONS - Miscellaneous ?[]  - 0 ?External ear exam ?[]  - 0 ?Specimen Collection (cultures, biopsies, blood, body fluids, etc.) ?[]  - 0 ?Specimen(s) / Culture(s) sent or taken to Lab for analysis ?[]  - 0 ?Patient Transfer (multiple staff / / Similar devices) ?[]  - 0 ?Simple Staple / Suture removal (25 or less) ?[]  - 0 ?Complex Staple / Suture removal (26 or more) ?[]  - 0 ?Hypo / Hyperglycemic Management (close monitor of Blood Glucose) ?[]  - 0 ?Ankle / Brachial Index (ABI) - do not check if billed separately ?X- 1 5 ?Vital Signs ?Has the patient been seen at the hospital within the last three years: Yes ?Total Score: 100 ?Level Of Care: New/Established - Level 3 ?Electronic Signature(s) ?Signed: 12/03/2021 1:03:04 PM By: ?Entered By: on 12/03/2021 09:56:18 ?-------------------------------------------------------------------------------- ?Encounter Discharge Information Details ?Patient Name: Date of Service: ? Christine J. 12/03/2021 9:30 A M ?Medical Record Number: ?Patient Account Number: ?Date of Birth/Sex: Treating RN: ?02/11/1951 (71 y.o. ?Primary Care Secret Kristensen: Other Clinician: ?Referring Domonic Kimball: ?Treating Jasyah Theurer/Extender: ? ?Weeks in Treatment: 2 ?Encounter Discharge Information Items ?Discharge Condition: Stable ?Ambulatory Status: Wheelchair ?Discharge Destination: Home ?Transportation: Private Auto ?Schedule Follow-up Appointment: Yes ?Clinical Summary of Care: Provided on 12/03/2021 ?Form Type Recipient ?Paper Patient Patient ?Electronic Signature(s) ?Signed: 12/03/2021 1:03:04 PM By: Antonieta Iba ?Entered By: 12/05/2021 on 12/03/2021 09:57:14 ?-------------------------------------------------------------------------------- ?Lower Extremity Assessment Details ?Patient Name: Date of Service: ?12/05/2021 Christine J. 12/03/2021 9:30 A M ?Medical Record Number: [id number] ?Patient Account Number: 02/20/1951 ?Date of Birth/Sex: Treating RN: ?[birth year] (71 y.o. F) Deaton, Bobbi ?Primary Care Charrisse Masley: [name] Other Clinician: ?Referring Anavey Coombes: ?Treating Shavon Ashmore/Extender: Baltazar Najjar ?[name] ?Weeks in Treatment: 2 ?Edema Assessment ?Assessed: [Left: Yes] [Right: No] ?Edema: [Left: Ye] [Right: s] ?Calf ?Left: Right: ?Point of Measurement: 31 cm From Medial Instep 52 cm ?Ankle ?Left: Right: ?Point of Measurement: 9 cm From Medial Instep 25 cm ?Vascular Assessment ?Pulses: ?Dorsalis Pedis ?Palpable: [Left:Yes] ?Electronic Signature(s) ?Signed: 12/03/2021 1:08:25 PM By: 12/05/2021 RN, BSN ?Entered By: Antonieta Iba on 12/03/2021 09:34:53 ?-------------------------------------------------------------------------------- ?Multi Wound Chart Details ?Patient Name: ?Date of Service: ?NO 12/05/2021 Christine J. 12/03/2021 9:30 A M ?Medical Record Number: 12/05/2021 ?Patient Account Number: [id number] ?Date of Birth/Sex: ?Treating RN: ?07/02/1951 (71 y.o. (73 ?Primary Care Jorah Hua: [name] ?Other Clinician: ?Referring Eular Panek: ?Treating Filomeno Cromley/Extender: Baltazar Najjar ?[name] ?Weeks in Treatment: 2 ?Vital Signs ?Height(in): 67 ?Pulse(bpm): 63 ?Weight(lbs): 340 ?Blood Pressure(mmHg):  101/43 ?Body Mass Index(BMI): 53.2 ?Temperature(??F): 97.9 ?Respiratory Rate(breaths/min): 22 ?Photos: [N/A:N/A] ?Left, Lateral Lower Leg N/A N/A ?Wound Location: ?Trauma N/A N/A ?Wounding Event: ?Trauma, Oth

## 2021-12-06 DIAGNOSIS — E039 Hypothyroidism, unspecified: Secondary | ICD-10-CM | POA: Diagnosis not present

## 2021-12-06 DIAGNOSIS — I4891 Unspecified atrial fibrillation: Secondary | ICD-10-CM | POA: Diagnosis not present

## 2021-12-06 DIAGNOSIS — Z9181 History of falling: Secondary | ICD-10-CM | POA: Diagnosis not present

## 2021-12-06 DIAGNOSIS — Z96653 Presence of artificial knee joint, bilateral: Secondary | ICD-10-CM | POA: Diagnosis not present

## 2021-12-06 DIAGNOSIS — F325 Major depressive disorder, single episode, in full remission: Secondary | ICD-10-CM | POA: Diagnosis not present

## 2021-12-06 DIAGNOSIS — I5032 Chronic diastolic (congestive) heart failure: Secondary | ICD-10-CM | POA: Diagnosis not present

## 2021-12-06 DIAGNOSIS — U071 COVID-19: Secondary | ICD-10-CM | POA: Diagnosis not present

## 2021-12-06 DIAGNOSIS — T24032D Burn of unspecified degree of left lower leg, subsequent encounter: Secondary | ICD-10-CM | POA: Diagnosis not present

## 2021-12-06 DIAGNOSIS — I130 Hypertensive heart and chronic kidney disease with heart failure and stage 1 through stage 4 chronic kidney disease, or unspecified chronic kidney disease: Secondary | ICD-10-CM | POA: Diagnosis not present

## 2021-12-06 DIAGNOSIS — N189 Chronic kidney disease, unspecified: Secondary | ICD-10-CM | POA: Diagnosis not present

## 2021-12-08 DIAGNOSIS — N189 Chronic kidney disease, unspecified: Secondary | ICD-10-CM | POA: Diagnosis not present

## 2021-12-08 DIAGNOSIS — E039 Hypothyroidism, unspecified: Secondary | ICD-10-CM | POA: Diagnosis not present

## 2021-12-08 DIAGNOSIS — Z96653 Presence of artificial knee joint, bilateral: Secondary | ICD-10-CM | POA: Diagnosis not present

## 2021-12-08 DIAGNOSIS — F325 Major depressive disorder, single episode, in full remission: Secondary | ICD-10-CM | POA: Diagnosis not present

## 2021-12-08 DIAGNOSIS — I5032 Chronic diastolic (congestive) heart failure: Secondary | ICD-10-CM | POA: Diagnosis not present

## 2021-12-08 DIAGNOSIS — U071 COVID-19: Secondary | ICD-10-CM | POA: Diagnosis not present

## 2021-12-08 DIAGNOSIS — T24032D Burn of unspecified degree of left lower leg, subsequent encounter: Secondary | ICD-10-CM | POA: Diagnosis not present

## 2021-12-08 DIAGNOSIS — I4891 Unspecified atrial fibrillation: Secondary | ICD-10-CM | POA: Diagnosis not present

## 2021-12-08 DIAGNOSIS — I130 Hypertensive heart and chronic kidney disease with heart failure and stage 1 through stage 4 chronic kidney disease, or unspecified chronic kidney disease: Secondary | ICD-10-CM | POA: Diagnosis not present

## 2021-12-08 DIAGNOSIS — Z9181 History of falling: Secondary | ICD-10-CM | POA: Diagnosis not present

## 2021-12-14 DIAGNOSIS — Z9181 History of falling: Secondary | ICD-10-CM | POA: Diagnosis not present

## 2021-12-14 DIAGNOSIS — I4891 Unspecified atrial fibrillation: Secondary | ICD-10-CM | POA: Diagnosis not present

## 2021-12-14 DIAGNOSIS — I130 Hypertensive heart and chronic kidney disease with heart failure and stage 1 through stage 4 chronic kidney disease, or unspecified chronic kidney disease: Secondary | ICD-10-CM | POA: Diagnosis not present

## 2021-12-14 DIAGNOSIS — U071 COVID-19: Secondary | ICD-10-CM | POA: Diagnosis not present

## 2021-12-14 DIAGNOSIS — Z96653 Presence of artificial knee joint, bilateral: Secondary | ICD-10-CM | POA: Diagnosis not present

## 2021-12-14 DIAGNOSIS — T24032D Burn of unspecified degree of left lower leg, subsequent encounter: Secondary | ICD-10-CM | POA: Diagnosis not present

## 2021-12-14 DIAGNOSIS — I5032 Chronic diastolic (congestive) heart failure: Secondary | ICD-10-CM | POA: Diagnosis not present

## 2021-12-14 DIAGNOSIS — N189 Chronic kidney disease, unspecified: Secondary | ICD-10-CM | POA: Diagnosis not present

## 2021-12-14 DIAGNOSIS — F325 Major depressive disorder, single episode, in full remission: Secondary | ICD-10-CM | POA: Diagnosis not present

## 2021-12-14 DIAGNOSIS — E039 Hypothyroidism, unspecified: Secondary | ICD-10-CM | POA: Diagnosis not present

## 2021-12-17 ENCOUNTER — Encounter (HOSPITAL_BASED_OUTPATIENT_CLINIC_OR_DEPARTMENT_OTHER): Payer: PPO | Admitting: Internal Medicine

## 2021-12-17 DIAGNOSIS — L97822 Non-pressure chronic ulcer of other part of left lower leg with fat layer exposed: Secondary | ICD-10-CM | POA: Diagnosis not present

## 2021-12-21 ENCOUNTER — Encounter: Payer: Self-pay | Admitting: Internal Medicine

## 2021-12-21 DIAGNOSIS — Z96653 Presence of artificial knee joint, bilateral: Secondary | ICD-10-CM | POA: Diagnosis not present

## 2021-12-21 DIAGNOSIS — I130 Hypertensive heart and chronic kidney disease with heart failure and stage 1 through stage 4 chronic kidney disease, or unspecified chronic kidney disease: Secondary | ICD-10-CM | POA: Diagnosis not present

## 2021-12-21 DIAGNOSIS — T24032D Burn of unspecified degree of left lower leg, subsequent encounter: Secondary | ICD-10-CM | POA: Diagnosis not present

## 2021-12-21 DIAGNOSIS — Z9181 History of falling: Secondary | ICD-10-CM | POA: Diagnosis not present

## 2021-12-21 DIAGNOSIS — I4891 Unspecified atrial fibrillation: Secondary | ICD-10-CM | POA: Diagnosis not present

## 2021-12-21 DIAGNOSIS — I5032 Chronic diastolic (congestive) heart failure: Secondary | ICD-10-CM | POA: Diagnosis not present

## 2021-12-21 DIAGNOSIS — E039 Hypothyroidism, unspecified: Secondary | ICD-10-CM | POA: Diagnosis not present

## 2021-12-21 DIAGNOSIS — U071 COVID-19: Secondary | ICD-10-CM | POA: Diagnosis not present

## 2021-12-21 DIAGNOSIS — F325 Major depressive disorder, single episode, in full remission: Secondary | ICD-10-CM | POA: Diagnosis not present

## 2021-12-21 DIAGNOSIS — N189 Chronic kidney disease, unspecified: Secondary | ICD-10-CM | POA: Diagnosis not present

## 2021-12-22 DIAGNOSIS — I480 Paroxysmal atrial fibrillation: Secondary | ICD-10-CM | POA: Diagnosis not present

## 2021-12-22 DIAGNOSIS — I10 Essential (primary) hypertension: Secondary | ICD-10-CM | POA: Diagnosis not present

## 2021-12-22 DIAGNOSIS — Z7901 Long term (current) use of anticoagulants: Secondary | ICD-10-CM | POA: Diagnosis not present

## 2021-12-22 DIAGNOSIS — S81802D Unspecified open wound, left lower leg, subsequent encounter: Secondary | ICD-10-CM | POA: Diagnosis not present

## 2021-12-22 DIAGNOSIS — D500 Iron deficiency anemia secondary to blood loss (chronic): Secondary | ICD-10-CM | POA: Diagnosis not present

## 2021-12-22 DIAGNOSIS — F3340 Major depressive disorder, recurrent, in remission, unspecified: Secondary | ICD-10-CM | POA: Diagnosis not present

## 2021-12-22 DIAGNOSIS — E039 Hypothyroidism, unspecified: Secondary | ICD-10-CM | POA: Diagnosis not present

## 2021-12-24 ENCOUNTER — Encounter (HOSPITAL_BASED_OUTPATIENT_CLINIC_OR_DEPARTMENT_OTHER): Payer: PPO | Attending: Internal Medicine | Admitting: Internal Medicine

## 2021-12-24 DIAGNOSIS — Z7901 Long term (current) use of anticoagulants: Secondary | ICD-10-CM | POA: Diagnosis not present

## 2021-12-24 DIAGNOSIS — X58XXXA Exposure to other specified factors, initial encounter: Secondary | ICD-10-CM | POA: Insufficient documentation

## 2021-12-24 DIAGNOSIS — I872 Venous insufficiency (chronic) (peripheral): Secondary | ICD-10-CM | POA: Insufficient documentation

## 2021-12-24 DIAGNOSIS — I890 Lymphedema, not elsewhere classified: Secondary | ICD-10-CM | POA: Diagnosis not present

## 2021-12-24 DIAGNOSIS — I130 Hypertensive heart and chronic kidney disease with heart failure and stage 1 through stage 4 chronic kidney disease, or unspecified chronic kidney disease: Secondary | ICD-10-CM | POA: Insufficient documentation

## 2021-12-24 DIAGNOSIS — I480 Paroxysmal atrial fibrillation: Secondary | ICD-10-CM | POA: Insufficient documentation

## 2021-12-24 DIAGNOSIS — L97822 Non-pressure chronic ulcer of other part of left lower leg with fat layer exposed: Secondary | ICD-10-CM | POA: Insufficient documentation

## 2021-12-24 DIAGNOSIS — T798XXA Other early complications of trauma, initial encounter: Secondary | ICD-10-CM | POA: Insufficient documentation

## 2021-12-24 DIAGNOSIS — N189 Chronic kidney disease, unspecified: Secondary | ICD-10-CM | POA: Insufficient documentation

## 2021-12-24 DIAGNOSIS — E039 Hypothyroidism, unspecified: Secondary | ICD-10-CM | POA: Insufficient documentation

## 2021-12-24 DIAGNOSIS — I5032 Chronic diastolic (congestive) heart failure: Secondary | ICD-10-CM | POA: Insufficient documentation

## 2021-12-27 DIAGNOSIS — Z96653 Presence of artificial knee joint, bilateral: Secondary | ICD-10-CM | POA: Diagnosis not present

## 2021-12-27 DIAGNOSIS — N189 Chronic kidney disease, unspecified: Secondary | ICD-10-CM | POA: Diagnosis not present

## 2021-12-27 DIAGNOSIS — U071 COVID-19: Secondary | ICD-10-CM | POA: Diagnosis not present

## 2021-12-27 DIAGNOSIS — E039 Hypothyroidism, unspecified: Secondary | ICD-10-CM | POA: Diagnosis not present

## 2021-12-27 DIAGNOSIS — Z9181 History of falling: Secondary | ICD-10-CM | POA: Diagnosis not present

## 2021-12-27 DIAGNOSIS — I130 Hypertensive heart and chronic kidney disease with heart failure and stage 1 through stage 4 chronic kidney disease, or unspecified chronic kidney disease: Secondary | ICD-10-CM | POA: Diagnosis not present

## 2021-12-27 DIAGNOSIS — I5032 Chronic diastolic (congestive) heart failure: Secondary | ICD-10-CM | POA: Diagnosis not present

## 2021-12-27 DIAGNOSIS — T24032D Burn of unspecified degree of left lower leg, subsequent encounter: Secondary | ICD-10-CM | POA: Diagnosis not present

## 2021-12-27 DIAGNOSIS — F325 Major depressive disorder, single episode, in full remission: Secondary | ICD-10-CM | POA: Diagnosis not present

## 2021-12-27 DIAGNOSIS — I4891 Unspecified atrial fibrillation: Secondary | ICD-10-CM | POA: Diagnosis not present

## 2021-12-27 NOTE — Progress Notes (Signed)
MARLETTA, BOUSQUET ([id number]) ?Visit Report for 12/24/2021 ?Chief Complaint Document Details ?Patient Name: Date of Service: ?Christine Rolling [name] 12/24/2021 11:15 A M ?Medical Record Number: [id number] ?Patient Account Number: [id number] ?Date of Birth/Sex: Treating RN: ?1951/01/21 (71 y.o. F) [name], [name] ?Primary Care Provider: [name] Other Clinician: ?Referring Provider: ?Treating Provider/Extender: Geralyn Corwin ?[name] ?Weeks in Treatment: 5 ?Information Obtained from: Patient ?Chief Complaint ?11/19/2021; Left lower extremity wound status post fall ?Electronic Signature(s) ?Signed: 12/27/2021 11:06:06 AM By: Geralyn Corwin DO ?Entered By: Geralyn Corwin on 12/27/2021 09:53:51 ?-------------------------------------------------------------------------------- ?HPI Details ?Patient Name: Date of Service: ?Christine Rolling [name] 12/24/2021 11:15 A M ?Medical Record Number: [id number] ?Patient Account Number: [id number] ?Date of Birth/Sex: Treating RN: ?28-May-1951 (71 y.o. F) [name], [name] ?Primary Care Provider: [name] Other Clinician: ?Referring Provider: ?Treating Provider/Extender: Geralyn Corwin ?[name] ?Weeks in Treatment: 5 ?History of Present Illness ?HPI Description: Admission 11/19/2021 ?[name] is a 71 year old female with a past medical history of paroxysmal A-fib on Eliquis, hypothyroidism, major depressive disorder, venous ?insufficiency and chronic diastolic heart failure that presents to the clinic for a 1 month history of nonhealing wound to the left lower extremity. She visited the ?ED on 10/18/2021 after a mechanical fall. She developed a hematoma that subsequently opened. She was hospitalized for 7 days and discharged on 10/25/2021. ?She has been on several different antibiotics For the past month. She states that most recently she was on Levaquin and linezolid for the past week. She ?states she completes her antibiotic course tomorrow. She has been using  Dakin's wet-to-dry dressings to the wound bed. She denies signs of infection. ?4/7; patient presents for follow-up. She has been using Dakin's wet-to-dry dressings. She did end up going to the ED on 4/1 because she had excess bleeding ?with dressing change that she could not stop. She is on Eliquis for A-fib. In the ED they tied off a small artery. She has had no issues since discharge. She ?denies signs of infection. ?4/14; this is a very difficult clinical situation. A patient with underlying chronic venous insufficiency and lymphedema very significant lower extremity edema ?had a hematoma after a fall on her left upper lateral lower leg. She is on Eliquis for atrial fibrillation apparently with a history of a splenic infarct following with ?Dr. Berton [name] of cardiology. She has exhibited significant bleeding from the wound surface including ao Venous bleeder that required suturing short while ago. ?She has been using Dakin's wet-to-dry packing and over the surface of the wound. ?She saw Dr. 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 Wants to communicate ?with Dr. Mikey [name] when she returns. In a perfect world where she was not on Eliquis she requires a wound VAC with additional compression wraps but I ?understand the reluctance to do this because of the concerns of bleeding ?4/28; the patient's wound actually looks better today using Dakin's wet-to-dry that she is changing twice a day she is wrapping this with Kerlix and Ace ?wrapping. She tells me she had 2 small bleeding areas which were part of the superficial wound that stopped this week with direct pressure. Other than that no ?major issues. ?In follow-up from discussion of last week Dr. 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 ?The  bigger question from my point of view is the wound VAC issue. As far as she knows and her daughter-in-law verifies that she has not had any bleeding ?from the deeper parts of the wound although the bleeding has been superficial including the one that sent her to the ER for stitches. She is concerned that a ?wound VAC would cause further bleeding and I cannot completely allay those concerns. ?5/8; patient presents for follow-up. She has no issues or complaints today. She has been using Dakin's wet-to-dry dressings without issues. She denies signs ?of infection. ?Electronic Signature(s) ?Signed: 12/27/2021 11:06:06 AM By: Geralyn Corwin DO ?Entered By: Geralyn Corwin on 12/27/2021 09:54:38 ?-------------------------------------------------------------------------------- ?Physical Exam Details ?Patient Name: Date of Service: ?Christine Rolling [name] 12/24/2021 11:15 A M ?Medical Record Number: [id number] ?Patient Account Number: [id number] ?Date of Birth/Sex: Treating RN: ?1951/02/22 (71 y.o. F) [name], [name] ?Primary Care Provider: [name] Other Clinician: ?Referring Provider: ?Treating Provider/Extender: Geralyn Corwin ?[name] ?Weeks in Treatment: 5 ?Constitutional ?respirations regular, non-labored and within target range for patient.Marland Kitchen ?Cardiovascular ?2+ dorsalis pedis/posterior tibialis pulses. ?Psychiatric ?pleasant and cooperative. ?Notes ?Left lower extremity: T the lateral aspect there is an open wound with granulation tissue and scattered areas of subcutaneous necrotic tissue. No surrounding ?o ?signs of infection. ?Electronic Signature(s) ?Signed: 12/27/2021 11:06:06 AM By: Geralyn Corwin DO ?Entered By: Geralyn Corwin on 12/27/2021 09:55:53 ?-------------------------------------------------------------------------------- ?Physician Orders Details ?Patient Name: Date of Service: ?Christine Rolling [name] 12/24/2021 11:15 A M ?Medical Record Number: [id number] ?Patient Account  Number: [id number] ?Date of Birth/Sex: Treating RN: ?Dec 03, 1950 (71 y.o. Dorthula Perfect, [name] ?Primary Care Provider: [name] Other Clinician: ?Referring Provider: ?Treating Provider/Extender: Geralyn Corwin ?[name] ?Weeks in Treatment: 5 ?Verbal / Phone Orders: No ?Diagnosis Coding ?ICD-10 Coding ?Code Description ?S93.734 Non-pressure chronic ulcer of other part of left lower leg with fat layer exposed ?T79.8XXA Other early complications of trauma, initial encounter ?I48.0 Paroxysmal atrial fibrillation ?Z79.01 Long term (current) use of anticoagulants ?Follow-up Appointments ?ppointment in 1 week. - Dr. Mikey [name] and Yvonne Kendall - Friday 5/12 at 9:00 ?Return A ?Bathing/ Shower/ Hygiene ?May shower with protection but do not get wound dressing(s) wet. ?Edema Control - Lymphedema / SCD / Other ?Elevate legs to the level of the heart or above for 30 minutes daily and/or when sitting, a frequency of: - 3-4 times a day throughout the day. ?Avoid standing for long periods of time. ?Home Health ?No change in wound care orders this week; continue Home Health for wound care. May utilize formulary equivalent dressing for wound ?treatment orders unless otherwise specified. - 2x a week dressing changes and all other days family member to change. ?Other Home Health Orders/Instructions: - [facility] ?Wound Treatment ?Wound #1 - Lower Leg Wound Laterality: Left, Lateral ?Cleanser: Wound Cleanser (Home Health) 2 x Per Day/30 Days ?Discharge Instructions: Cleanse the wound with wound cleanser prior to applying a clean dressing using gauze sponges, not tissue or cotton balls. ?Prim Dressing: Plain packing strip 1/2 (in) 2 x Per Day/30 Days ?ary ?Discharge Instructions: Lightly pack as instructed. May use 1" or conform gauze ?Prim Dressing: Dakin's Solution 0.25%, 16 (oz) (Home Health) 2 x Per Day/30 Days ?ary ?Discharge Instructions: Moisten gauze with Dakin's solution ?Secondary Dressing: ABD Pad, 8x10 (Home Health) 2 x Per  Day/30 Days ?Discharge Instructions: Apply over primary dressing as directed. ?Secondary Dressing: Woven Gauze Sponge, Non-Sterile 4x4 in (Home Health) 2 x Per Day/30 Days ?Discharge Instructions: Apply over prim

## 2021-12-28 NOTE — Progress Notes (Signed)
KEYONNA, COMUNALE ([id number]) ?Visit Report for 12/17/2021 ?Debridement Details ?Patient Name: Date of Service: ?[name] 12/17/2021 12:30 PM ?Medical Record Number: [id number] ?Patient Account Number: [id number] ?Date of Birth/Sex: Treating RN: ?April 03, 1951 (71 y.o. [name] ?Primary Care Provider: [name] Other Clinician: ?Referring Provider: ?Treating Provider/Extender: Baltazar Najjar ?[name] ?Weeks in Treatment: 4 ?Debridement Performed for Assessment: Wound #1 Left,Lateral Lower Leg ?Performed By: Physician Maxwell Caul., MD ?Debridement Type: Debridement ?Level of Consciousness (Pre-procedure): Awake and Alert ?Pre-procedure Verification/Time Out Yes - 13:09 ?Taken: ?Start Time: 13:09 ?Pain Control: ?Other : benzocaine 20% spray ?T Area Debrided (L x W): ?otal 3 (cm) x 3 (cm) = 9 (cm?) ?Tissue and other material debrided: Viable, Non-Viable, Slough, Subcutaneous, Slough ?Level: Skin/Subcutaneous Tissue ?Debridement Description: Excisional ?Instrument: Forceps, Scissors ?Bleeding: Minimum ?Hemostasis Achieved: Silver Nitrate ?Procedural Pain: 0 ?Post Procedural Pain: 0 ?Response to Treatment: Procedure was tolerated well ?Level of Consciousness (Post- Awake and Alert ?procedure): ?Post Debridement Measurements of Total Wound ?Length: (cm) 11 ?Width: (cm) 11 ?Depth: (cm) 5.7 ?Volume: (cm?) 541.689 ?Character of Wound/Ulcer Post Debridement: Improved ?Post Procedure Diagnosis ?Same as Pre-procedure ?Electronic Signature(s) ?Signed: 12/17/2021 4:01:11 PM By: Baltazar Najjar MD ?Signed: 12/20/2021 5:13:39 PM By: Antonieta Iba ?Entered By: Baltazar Najjar on 12/17/2021 13:50:12 ?-------------------------------------------------------------------------------- ?HPI Details ?Patient Name: Date of Service: ?[name] 12/17/2021 12:30 PM ?Medical Record Number: [id number] ?Patient Account Number: [id number] ?Date of Birth/Sex: Treating RN: ?12-20-1950 (71 y.o. [name] ?Primary Care Provider: [name] Other Clinician: ?Referring Provider: ?Treating Provider/Extender: Baltazar Najjar ?[name] ?Weeks in Treatment: 4 ?History of Present Illness ?HPI Description: Admission 11/19/2021 ?Ms. Madalena Kesecker is a 71 year old female with a past medical history of paroxysmal A-fib on Eliquis, hypothyroidism, major depressive disorder, venous ?insufficiency and chronic diastolic heart failure that presents to the clinic for a 1 month history of nonhealing wound to the left lower extremity. She visited the ?ED on 10/18/2021 after a mechanical fall. She developed a hematoma that subsequently opened. She was hospitalized for 7 days and discharged on 10/25/2021. ?She has been on several different antibiotics For the past month. She states that most recently she was on Levaquin and linezolid for the past week. She ?states she completes her antibiotic course tomorrow. She has been using Dakin's wet-to-dry dressings to the wound bed. She denies signs of infection. ?4/7; patient presents for follow-up. She has been using Dakin's wet-to-dry dressings. She did end up going to the ED on 4/1 because she had excess bleeding ?with dressing change that she could not stop. She is on Eliquis for A-fib. In the ED they tied off a small artery. She has had no issues since discharge. She ?denies signs of infection. ?4/14; this is a very difficult clinical situation. A patient with underlying chronic venous insufficiency and lymphedema very significant lower extremity edema ?had a hematoma after a fall on her left upper lateral lower leg. She is on Eliquis for atrial fibrillation apparently with a history of a splenic infarct following with ?Dr. Berton Mount of cardiology. She has exhibited significant bleeding from the wound surface including ao Venous bleeder that required suturing short while ago. ?She has been using Dakin's wet-to-dry packing and over the surface of the wound. ?She saw Dr. Graciela Husbands  yesterday he is reluctant to consider stopping the Eliquis because of the prior history of presumed cardioembolism. Wants to communicate ?with Dr. Mikey Bussing when she returns. In a perfect world where she was not on Eliquis she  requires a wound VAC with additional compression wraps but I ?understand the reluctance to do this because of the concerns of bleeding ?4/28; the patient's wound actually looks better today using Dakin's wet-to-dry that she is changing twice a day she is wrapping this with Kerlix and Ace ?wrapping. She tells me she had 2 small bleeding areas which were part of the superficial wound that stopped this week with direct pressure. Other than that no ?major issues. ?In follow-up from discussion of last week Dr. 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 ?The bigger question from my point of view is the wound VAC issue. As far as she knows and her daughter-in-law verifies that she has not had any bleeding ?from the deeper parts of the wound although the bleeding has been superficial including the one that sent her to the ER for stitches. She is concerned that a ?wound VAC would cause further bleeding and I cannot completely allay those concerns. ?Electronic Signature(s) ?Signed: 12/17/2021 4:01:11 PM By: Baltazar Najjar MD ?Entered By: Baltazar Najjar on 12/17/2021 13:43:54 ?-------------------------------------------------------------------------------- ?Physical Exam Details ?Patient Name: Date of Service: ?[name] 12/17/2021 12:30 PM ?Medical Record Number: [id number] ?Patient Account Number: [id number] ?Date of Birth/Sex: Treating RN: ?04-14-1951 (71 y.o. [name] ?Primary Care Provider: [name] Other Clinician: ?Referring Provider: ?Treating Provider/Extender: Baltazar Najjar ?[name] ?Weeks in  Treatment: 4 ?Constitutional ?Sitting or standing Blood Pressure is within target range for patient.. Pulse regular and within target range for patient.Marland Kitchen Respirations regular, non-labored and ?within target range.. Temperature is normal and within the target range for the patient.Marland Kitchen Appears in no distress. ?Cardiovascular ?Changes of severe venous hypertension and lymphedema in the left greater than right leg. ?Notes ?Wound exam; left lateral leg wound in the setting of chronic venous insufficiency and severe lymphedema. She has direct depth at 4:00 of about 4.6 cm and a ?tunnel in this area of over 9 cm. The more superficial parts of this wound actually look as though they are epithelializing and things look healthy. There are ?some weeping areas that I cauterized with silver nitrate quite extensively as some of the tissue looks friable. There is no evidence of infection ?I used pickups and scissors to remove some necrotic subcutaneous tissue at 9:00 in the deeper central area of the wound. Fortunately there was no problems ?with bleeding ?Electronic Signature(s) ?Signed: 12/17/2021 4:01:11 PM By: Baltazar Najjar MD ?Entered By: Baltazar Najjar on 12/17/2021 13:50:38 ?-------------------------------------------------------------------------------- ?Physician Orders Details ?Patient Name: ?Date of Service: ?[name] 12/17/2021 12:30 PM ?Medical Record Number: [id number] ?Patient Account Number: [id number] ?Date of Birth/Sex: ?Treating RN: ?1951-02-05 (71 y.o. F) Redmond Pulling ?Primary Care Provider: [name] ?Other Clinician: ?Referring Provider: ?Treating Provider/Extender: Baltazar Najjar ?[name] ?Weeks in Treatment: 4 ?Verbal / Phone Orders: No ?Diagnosis Coding ?ICD-10 Coding ?Code Description ?N47.096 Non-pressure chronic ulcer of other part of left lower leg with fat layer exposed ?T79.8XXA Other early complications of trauma, initial encounter ?I48.0 Paroxysmal atrial fibrillation ?Z79.01  Long term (current) use of anticoagulants ?Follow-up Appointments ?ppointment in 1 week. - Dr. Mikey Bussing and Yvonne Kendall ?Return A ?Bathing/ Shower/ Hygiene ?May shower with protection but do not get wound dressing(s) wet

## 2021-12-28 NOTE — Progress Notes (Signed)
[name], [name] ([id number]) ?Visit Report for 12/24/2021 ?Arrival Information Details ?Patient Name: Date of Service: ?[name] Christine J. 12/24/2021 11:15 A M ?Medical Record Number: [id number] ?Patient Account Number: [id number] ?Date of Birth/Sex: Treating RN: ?[birth year] (71 y.o. F) [name] ?Primary Care [name]: [name] ?Other Clinician: Haywood [name] ?Referring [name]: ?Treating [name]/Extender: [name] ?[name] ?Weeks in Treatment: 5 ?Visit Information History Since Last Visit ?All ordered tests and consults were completed: Yes ?Patient Arrived: Wheel Chair ?Added or deleted any medications: Yes ?Arrival Time: 11:29 ?Any new allergies or adverse reactions: No ?Accompanied By: brother ?Had a fall or experienced change in No ?Transfer Assistance: None ?activities of daily living that may affect ?Patient Identification Verified: Yes ?risk of falls: ?Secondary Verification Process Completed: Yes ?Signs or symptoms of abuse/neglect since last visito No ?Patient Requires Transmission-Based Precautions: No ?Hospitalized since last visit: No ?Patient Has Alerts: Yes ?Implantable device outside of the clinic excluding No ?Patient Alerts: Patient on Blood Thinner cellular tissue based products placed in the center ?since last visit: ?Pain Present Now: No ?Electronic Signature(s) ?Signed: 12/28/2021 10:44:55 AM By: [name] CHT EMT BS ?, , ?Entered By: [name] on 12/24/2021 11:34:19 ?-------------------------------------------------------------------------------- ?Clinic Level of Care Assessment Details ?Patient Name: Date of Service: ?[name] Christine J. 12/24/2021 11:15 A M ?Medical Record Number: [id number] ?Patient Account Number: [id number] ?Date of Birth/Sex: Treating RN: ?June 28, 1951 (71 y.o. [name], [name] ?Primary Care [name]: [name] Other Clinician: ?Referring [name]: ?Treating [name]/Extender: [name] ?[name] ?Weeks in Treatment:  5 ?Clinic Level of Care Assessment Items ?TOOL 4 Quantity Score ?X- 1 0 ?Use when only an EandM is performed on FOLLOW-UP visit ?ASSESSMENTS - Nursing Assessment / Reassessment ?X- 1 10 ?Reassessment of Co-morbidities (includes updates in patient status) ?X- 1 5 ?Reassessment of Adherence to Treatment Plan ?ASSESSMENTS - Wound and Skin A ssessment / Reassessment ?X - Simple Wound Assessment / Reassessment - one wound 1 5 ?[]  - 0 ?Complex Wound Assessment / Reassessment - multiple wounds ?[]  - 0 ?Dermatologic / Skin Assessment (not related to wound area) ?ASSESSMENTS - Focused Assessment ?[]  - 0 ?Circumferential Edema Measurements - multi extremities ?[]  - 0 ?Nutritional Assessment / Counseling / Intervention ?X- 1 5 ?Lower Extremity Assessment (monofilament, tuning fork, pulses) ?[]  - 0 ?Peripheral Arterial Disease Assessment (using hand held doppler) ?ASSESSMENTS - Ostomy and/or Continence Assessment and Care ?[]  - 0 ?Incontinence Assessment and Management ?[]  - 0 ?Ostomy Care Assessment and Management (repouching, etc.) ?PROCESS - Coordination of Care ?X - Simple Patient / Family Education for ongoing care 1 15 ?[]  - 0 ?Complex (extensive) Patient / Family Education for ongoing care ?X- 1 10 ?Staff obtains Consents, Records, T Results / Process Orders ?est ?X- 1 10 ?Staff telephones HHA, Nursing Homes / Clarify orders / etc ?[]  - 0 ?Routine Transfer to another Facility (non-emergent condition) ?[]  - 0 ?Routine Hospital Admission (non-emergent condition) ?[]  - 0 ?New Admissions / / Ordering NPWT Apligraf, etc. ?, ?[]  - 0 ?Emergency Hospital Admission (emergent condition) ?X- 1 10 ?Simple Discharge Coordination ?[]  - 0 ?Complex (extensive) Discharge Coordination ?PROCESS - Special Needs ?[]  - 0 ?Pediatric / Minor Patient Management ?[]  - 0 ?Isolation Patient Management ?[]  - 0 ?Hearing / Language / Visual special needs ?[]  - 0 ?Assessment of Community assistance (transportation, D/C  planning, etc.) ?[]  - 0 ?Additional assistance / Altered mentation ?[]  - 0 ?Support Surface(s) Assessment (bed, cushion, seat, etc.) ?INTERVENTIONS - Wound Cleansing / Measurement ?X -  Simple Wound Cleansing - one wound 1 5 ?[]  - 0 ?Complex Wound Cleansing - multiple wounds ?X- 1 5 ?Wound Imaging (photographs - any number of wounds) ?[]  - 0 ?Wound Tracing (instead of photographs) ?X- 1 5 ?Simple Wound Measurement - one wound ?[]  - 0 ?Complex Wound Measurement - multiple wounds ?INTERVENTIONS - Wound Dressings ?[]  - 0 ?Small Wound Dressing one or multiple wounds ?[]  - 0 ?Medium Wound Dressing one or multiple wounds ?X- 1 20 ?Large Wound Dressing one or multiple wounds ?[]  - 0 ?Application of Medications - topical ?[]  - 0 ?Application of Medications - injection ?INTERVENTIONS - Miscellaneous ?[]  - 0 ?External ear exam ?[]  - 0 ?Specimen Collection (cultures, biopsies, blood, body fluids, etc.) ?[]  - 0 ?Specimen(s) / Culture(s) sent or taken to Lab for analysis ?[]  - 0 ?Patient Transfer (multiple staff / / Similar devices) ?[]  - 0 ?Simple Staple / Suture removal (25 or less) ?[]  - 0 ?Complex Staple / Suture removal (26 or more) ?[]  - 0 ?Hypo / Hyperglycemic Management (close monitor of Blood Glucose) ?[]  - 0 ?Ankle / Brachial Index (ABI) - do not check if billed separately ?X- 1 5 ?Vital Signs ?Has the patient been seen at the hospital within the last three years: Yes ?Total Score: 110 ?Level Of Care: New/Established - Level 3 ?Electronic Signature(s) ?Signed: 12/24/2021 1:22:09 PM By: RN, BSN ?Entered By: on 12/24/2021 12:49:14 ?-------------------------------------------------------------------------------- ?Encounter Discharge Information Details ?Patient Name: Date of Service: ? Christine J. 12/24/2021 11:15 A M ?Medical Record Number: ?Patient Account Number: ?Date of Birth/Sex: Treating RN: ?Feb 21, 1951 (71 y.o. Nurse, adult, [name] ?Primary Care  [name]: Other Clinician: ?Referring Hector Taft: ?Treating Swetha Rayle/Extender: ? ?Weeks in Treatment: 5 ?Encounter Discharge Information Items ?Discharge Condition: Stable ?Ambulatory Status: Wheelchair ?Discharge Destination: Home ?Transportation: Private Auto ?Accompanied By: family member ?Schedule Follow-up Appointment: Yes ?Clinical Summary of Care: Patient Declined ?Electronic Signature(s) ?Signed: 12/24/2021 1:22:09 PM By: 02/23/2022 RN, BSN ?Entered By: Zandra Abts on 12/24/2021 12:49:47 ?-------------------------------------------------------------------------------- ?Lower Extremity Assessment Details ?Patient Name: Date of Service: ?02/23/2022 Christine J. 12/24/2021 11:15 A M ?Medical Record Number: 02/23/2022 ?Patient Account Number: [id number] ?Date of Birth/Sex: Treating RN: ?[birth year] (71 y.o. F) [name] ?Primary Care Bear Osten: (67 Other Clinician: ?Referring Kalena Mander: ?Treating Homero Hyson/Extender: [name] ?[name] ?Weeks in Treatment: 5 ?Edema Assessment ?Assessed: [Left: No] [Right: No] ?Edema: [Left: Ye] [Right: s] ?Calf ?Left: Right: ?Point of Measurement: 31 cm From Medial Instep 54.2 cm ?Ankle ?Left: Right: ?Point of Measurement: 9 cm From Medial Instep 27.3 cm ?Electronic Signature(s) ?Signed: 12/24/2021 2:28:26 PM By: Christine Brunette RN, BSN ?Signed: 12/28/2021 10:44:55 AM By: Zandra Abts CHT EMT BS ?, , ?Entered By: Zandra Abts on 12/24/2021 11:45:27 ?-------------------------------------------------------------------------------- ?Multi Wound Chart Details ?Patient Name: ?Date of Service: ?NO [name] Christine J. 12/24/2021 11:15 A M ?Medical Record Number: [id number] ?Patient Account Number: [id number] ?Date of Birth/Sex: ?Treating RN: ?02-Apr-1951 (71 y.o. F) [name] ?Primary Care Larson Limones: [name] ?Other Clinician: ?Referring Shamel Germond: ?Treating Oralia Criger/Extender: [name] ?[name] ?Weeks in  Treatment: 5 ?Vital Signs ?Height(in): 67 ?Pulse(bpm): 59 ?Weight(lbs): 340 ?Blood Pressure(mmHg): 149/67 ?Body Mass Index(BMI): 53.2 ?Temperature(??F): 98.2 ?Respiratory Rate(breaths/min): 18 ?Photos: [Christine/A:Christine/A] ?Left

## 2021-12-30 ENCOUNTER — Encounter (HOSPITAL_BASED_OUTPATIENT_CLINIC_OR_DEPARTMENT_OTHER): Payer: PPO | Admitting: Internal Medicine

## 2021-12-30 DIAGNOSIS — I10 Essential (primary) hypertension: Secondary | ICD-10-CM | POA: Diagnosis not present

## 2021-12-30 DIAGNOSIS — T798XXA Other early complications of trauma, initial encounter: Secondary | ICD-10-CM

## 2021-12-30 DIAGNOSIS — I110 Hypertensive heart disease with heart failure: Secondary | ICD-10-CM | POA: Diagnosis not present

## 2021-12-30 DIAGNOSIS — Z7901 Long term (current) use of anticoagulants: Secondary | ICD-10-CM | POA: Diagnosis not present

## 2021-12-30 DIAGNOSIS — L97229 Non-pressure chronic ulcer of left calf with unspecified severity: Secondary | ICD-10-CM | POA: Diagnosis not present

## 2021-12-30 DIAGNOSIS — F419 Anxiety disorder, unspecified: Secondary | ICD-10-CM | POA: Diagnosis not present

## 2021-12-30 DIAGNOSIS — Z8249 Family history of ischemic heart disease and other diseases of the circulatory system: Secondary | ICD-10-CM | POA: Diagnosis not present

## 2021-12-30 DIAGNOSIS — Z96652 Presence of left artificial knee joint: Secondary | ICD-10-CM | POA: Diagnosis not present

## 2021-12-30 DIAGNOSIS — L089 Local infection of the skin and subcutaneous tissue, unspecified: Secondary | ICD-10-CM | POA: Diagnosis not present

## 2021-12-30 DIAGNOSIS — Z6841 Body Mass Index (BMI) 40.0 and over, adult: Secondary | ICD-10-CM | POA: Diagnosis not present

## 2021-12-30 DIAGNOSIS — I480 Paroxysmal atrial fibrillation: Secondary | ICD-10-CM

## 2021-12-30 DIAGNOSIS — Z79890 Hormone replacement therapy (postmenopausal): Secondary | ICD-10-CM | POA: Diagnosis not present

## 2021-12-30 DIAGNOSIS — Z79899 Other long term (current) drug therapy: Secondary | ICD-10-CM | POA: Diagnosis not present

## 2021-12-30 DIAGNOSIS — Z87891 Personal history of nicotine dependence: Secondary | ICD-10-CM | POA: Diagnosis not present

## 2021-12-30 DIAGNOSIS — E871 Hypo-osmolality and hyponatremia: Secondary | ICD-10-CM | POA: Diagnosis not present

## 2021-12-30 DIAGNOSIS — L97929 Non-pressure chronic ulcer of unspecified part of left lower leg with unspecified severity: Secondary | ICD-10-CM | POA: Diagnosis not present

## 2021-12-30 DIAGNOSIS — K219 Gastro-esophageal reflux disease without esophagitis: Secondary | ICD-10-CM | POA: Diagnosis not present

## 2021-12-30 DIAGNOSIS — Z8616 Personal history of COVID-19: Secondary | ICD-10-CM | POA: Diagnosis not present

## 2021-12-30 DIAGNOSIS — L97822 Non-pressure chronic ulcer of other part of left lower leg with fat layer exposed: Secondary | ICD-10-CM

## 2021-12-30 DIAGNOSIS — E039 Hypothyroidism, unspecified: Secondary | ICD-10-CM | POA: Diagnosis not present

## 2021-12-30 DIAGNOSIS — I5032 Chronic diastolic (congestive) heart failure: Secondary | ICD-10-CM | POA: Diagnosis not present

## 2021-12-30 DIAGNOSIS — L03116 Cellulitis of left lower limb: Secondary | ICD-10-CM | POA: Diagnosis not present

## 2021-12-30 DIAGNOSIS — Z8261 Family history of arthritis: Secondary | ICD-10-CM | POA: Diagnosis not present

## 2021-12-30 DIAGNOSIS — S81802A Unspecified open wound, left lower leg, initial encounter: Secondary | ICD-10-CM | POA: Diagnosis not present

## 2022-01-01 ENCOUNTER — Encounter (HOSPITAL_COMMUNITY): Payer: Self-pay | Admitting: *Deleted

## 2022-01-01 ENCOUNTER — Inpatient Hospital Stay (HOSPITAL_COMMUNITY)
Admission: EM | Admit: 2022-01-01 | Discharge: 2022-01-07 | DRG: 603 | Disposition: A | Discharge status: Home Health | Payer: PPO | Attending: Internal Medicine | Admitting: Internal Medicine

## 2022-01-01 ENCOUNTER — Other Ambulatory Visit: Payer: Self-pay

## 2022-01-01 ENCOUNTER — Emergency Department (HOSPITAL_COMMUNITY): Payer: PPO

## 2022-01-01 DIAGNOSIS — I110 Hypertensive heart disease with heart failure: Secondary | ICD-10-CM | POA: Diagnosis present

## 2022-01-01 DIAGNOSIS — Z8616 Personal history of COVID-19: Secondary | ICD-10-CM

## 2022-01-01 DIAGNOSIS — L089 Local infection of the skin and subcutaneous tissue, unspecified: Principal | ICD-10-CM

## 2022-01-01 DIAGNOSIS — Z87891 Personal history of nicotine dependence: Secondary | ICD-10-CM

## 2022-01-01 DIAGNOSIS — L03116 Cellulitis of left lower limb: Secondary | ICD-10-CM | POA: Diagnosis present

## 2022-01-01 DIAGNOSIS — I5032 Chronic diastolic (congestive) heart failure: Secondary | ICD-10-CM | POA: Diagnosis present

## 2022-01-01 DIAGNOSIS — E039 Hypothyroidism, unspecified: Secondary | ICD-10-CM | POA: Diagnosis present

## 2022-01-01 DIAGNOSIS — S81802A Unspecified open wound, left lower leg, initial encounter: Secondary | ICD-10-CM | POA: Diagnosis not present

## 2022-01-01 DIAGNOSIS — Z7901 Long term (current) use of anticoagulants: Secondary | ICD-10-CM

## 2022-01-01 DIAGNOSIS — E871 Hypo-osmolality and hyponatremia: Secondary | ICD-10-CM

## 2022-01-01 DIAGNOSIS — K219 Gastro-esophageal reflux disease without esophagitis: Secondary | ICD-10-CM | POA: Diagnosis present

## 2022-01-01 DIAGNOSIS — L97822 Non-pressure chronic ulcer of other part of left lower leg with fat layer exposed: Secondary | ICD-10-CM

## 2022-01-01 DIAGNOSIS — Z8249 Family history of ischemic heart disease and other diseases of the circulatory system: Secondary | ICD-10-CM

## 2022-01-01 DIAGNOSIS — Z79899 Other long term (current) drug therapy: Secondary | ICD-10-CM

## 2022-01-01 DIAGNOSIS — I10 Essential (primary) hypertension: Secondary | ICD-10-CM | POA: Diagnosis present

## 2022-01-01 DIAGNOSIS — Z79890 Hormone replacement therapy (postmenopausal): Secondary | ICD-10-CM

## 2022-01-01 DIAGNOSIS — Z96652 Presence of left artificial knee joint: Secondary | ICD-10-CM | POA: Diagnosis present

## 2022-01-01 DIAGNOSIS — Z6841 Body Mass Index (BMI) 40.0 and over, adult: Secondary | ICD-10-CM

## 2022-01-01 DIAGNOSIS — E66813 Obesity, class 3: Secondary | ICD-10-CM

## 2022-01-01 DIAGNOSIS — I480 Paroxysmal atrial fibrillation: Secondary | ICD-10-CM | POA: Diagnosis present

## 2022-01-01 DIAGNOSIS — Z8261 Family history of arthritis: Secondary | ICD-10-CM

## 2022-01-01 DIAGNOSIS — F419 Anxiety disorder, unspecified: Secondary | ICD-10-CM | POA: Diagnosis present

## 2022-01-01 LAB — URINALYSIS, ROUTINE W REFLEX MICROSCOPIC
Bilirubin Urine: NEGATIVE
Glucose, UA: NEGATIVE mg/dL
Hgb urine dipstick: NEGATIVE
Ketones, ur: NEGATIVE mg/dL
Leukocytes,Ua: NEGATIVE
Nitrite: NEGATIVE
Protein, ur: NEGATIVE mg/dL
Specific Gravity, Urine: 1.009 (ref 1.005–1.030)
pH: 5 (ref 5.0–8.0)

## 2022-01-01 LAB — CBC WITH DIFFERENTIAL/PLATELET
Abs Immature Granulocytes: 0.12 10*3/uL — ABNORMAL HIGH (ref 0.00–0.07)
Basophils Absolute: 0.1 10*3/uL (ref 0.0–0.1)
Basophils Relative: 0 %
Eosinophils Absolute: 0.2 10*3/uL (ref 0.0–0.5)
Eosinophils Relative: 1 %
HCT: 32.9 % — ABNORMAL LOW (ref 36.0–46.0)
Hemoglobin: 10.5 g/dL — ABNORMAL LOW (ref 12.0–15.0)
Immature Granulocytes: 1 %
Lymphocytes Relative: 7 %
Lymphs Abs: 1 10*3/uL (ref 0.7–4.0)
MCH: 27.9 pg (ref 26.0–34.0)
MCHC: 31.9 g/dL (ref 30.0–36.0)
MCV: 87.5 fL (ref 80.0–100.0)
Monocytes Absolute: 0.8 10*3/uL (ref 0.1–1.0)
Monocytes Relative: 5 %
Neutro Abs: 13.2 10*3/uL — ABNORMAL HIGH (ref 1.7–7.7)
Neutrophils Relative %: 86 %
Platelets: 298 10*3/uL (ref 150–400)
RBC: 3.76 MIL/uL — ABNORMAL LOW (ref 3.87–5.11)
RDW: 15.1 % (ref 11.5–15.5)
WBC: 15.3 10*3/uL — ABNORMAL HIGH (ref 4.0–10.5)
nRBC: 0 % (ref 0.0–0.2)

## 2022-01-01 LAB — COMPREHENSIVE METABOLIC PANEL
ALT: 16 U/L (ref 0–44)
AST: 21 U/L (ref 15–41)
Albumin: 3.1 g/dL — ABNORMAL LOW (ref 3.5–5.0)
Alkaline Phosphatase: 72 U/L (ref 38–126)
Anion gap: 10 (ref 5–15)
BUN: 44 mg/dL — ABNORMAL HIGH (ref 8–23)
CO2: 23 mmol/L (ref 22–32)
Calcium: 8.9 mg/dL (ref 8.9–10.3)
Chloride: 97 mmol/L — ABNORMAL LOW (ref 98–111)
Creatinine, Ser: 1.1 mg/dL — ABNORMAL HIGH (ref 0.44–1.00)
GFR, Estimated: 53 mL/min — ABNORMAL LOW (ref >60)
Glucose, Bld: 106 mg/dL — ABNORMAL HIGH (ref 70–99)
Potassium: 5 mmol/L (ref 3.5–5.1)
Sodium: 130 mmol/L — ABNORMAL LOW (ref 135–145)
Total Bilirubin: 0.8 mg/dL (ref 0.3–1.2)
Total Protein: 7.4 g/dL (ref 6.5–8.1)

## 2022-01-01 LAB — LACTIC ACID, PLASMA: Lactic Acid, Venous: 1 mmol/L (ref 0.5–1.9)

## 2022-01-01 LAB — PROTIME-INR
INR: 1.8 — ABNORMAL HIGH (ref 0.8–1.2)
Prothrombin Time: 20.4 seconds — ABNORMAL HIGH (ref 11.4–15.2)

## 2022-01-01 NOTE — ED Triage Notes (Signed)
The pt has had a lt leg wound since February  she has been  seeing a wound care specialist and she was last seen Thursday since last night she has had increased pain in the lt leg wound with  redness sswelling and pain in the entire lowed leg she has had chills also ?

## 2022-01-02 ENCOUNTER — Inpatient Hospital Stay (HOSPITAL_COMMUNITY): Payer: PPO

## 2022-01-02 ENCOUNTER — Emergency Department (HOSPITAL_COMMUNITY): Payer: PPO

## 2022-01-02 ENCOUNTER — Encounter (HOSPITAL_COMMUNITY): Payer: Self-pay | Admitting: Internal Medicine

## 2022-01-02 DIAGNOSIS — Z6841 Body Mass Index (BMI) 40.0 and over, adult: Secondary | ICD-10-CM

## 2022-01-02 DIAGNOSIS — Z8261 Family history of arthritis: Secondary | ICD-10-CM | POA: Diagnosis not present

## 2022-01-02 DIAGNOSIS — E039 Hypothyroidism, unspecified: Secondary | ICD-10-CM | POA: Diagnosis present

## 2022-01-02 DIAGNOSIS — K219 Gastro-esophageal reflux disease without esophagitis: Secondary | ICD-10-CM | POA: Diagnosis present

## 2022-01-02 DIAGNOSIS — F419 Anxiety disorder, unspecified: Secondary | ICD-10-CM | POA: Diagnosis present

## 2022-01-02 DIAGNOSIS — I480 Paroxysmal atrial fibrillation: Secondary | ICD-10-CM

## 2022-01-02 DIAGNOSIS — I5032 Chronic diastolic (congestive) heart failure: Secondary | ICD-10-CM | POA: Diagnosis present

## 2022-01-02 DIAGNOSIS — Z8249 Family history of ischemic heart disease and other diseases of the circulatory system: Secondary | ICD-10-CM | POA: Diagnosis not present

## 2022-01-02 DIAGNOSIS — L97929 Non-pressure chronic ulcer of unspecified part of left lower leg with unspecified severity: Secondary | ICD-10-CM | POA: Diagnosis not present

## 2022-01-02 DIAGNOSIS — I10 Essential (primary) hypertension: Secondary | ICD-10-CM | POA: Diagnosis not present

## 2022-01-02 DIAGNOSIS — Z79899 Other long term (current) drug therapy: Secondary | ICD-10-CM | POA: Diagnosis not present

## 2022-01-02 DIAGNOSIS — I110 Hypertensive heart disease with heart failure: Secondary | ICD-10-CM | POA: Diagnosis present

## 2022-01-02 DIAGNOSIS — Z8616 Personal history of COVID-19: Secondary | ICD-10-CM | POA: Diagnosis not present

## 2022-01-02 DIAGNOSIS — S81802A Unspecified open wound, left lower leg, initial encounter: Secondary | ICD-10-CM | POA: Diagnosis not present

## 2022-01-02 DIAGNOSIS — Z79890 Hormone replacement therapy (postmenopausal): Secondary | ICD-10-CM | POA: Diagnosis not present

## 2022-01-02 DIAGNOSIS — L97822 Non-pressure chronic ulcer of other part of left lower leg with fat layer exposed: Secondary | ICD-10-CM

## 2022-01-02 DIAGNOSIS — E871 Hypo-osmolality and hyponatremia: Secondary | ICD-10-CM

## 2022-01-02 DIAGNOSIS — L03116 Cellulitis of left lower limb: Secondary | ICD-10-CM | POA: Diagnosis present

## 2022-01-02 DIAGNOSIS — L97229 Non-pressure chronic ulcer of left calf with unspecified severity: Secondary | ICD-10-CM | POA: Diagnosis not present

## 2022-01-02 DIAGNOSIS — Z96652 Presence of left artificial knee joint: Secondary | ICD-10-CM | POA: Diagnosis present

## 2022-01-02 DIAGNOSIS — Z87891 Personal history of nicotine dependence: Secondary | ICD-10-CM | POA: Diagnosis not present

## 2022-01-02 DIAGNOSIS — Z7901 Long term (current) use of anticoagulants: Secondary | ICD-10-CM | POA: Diagnosis not present

## 2022-01-02 MED ORDER — METOPROLOL TARTRATE 50 MG PO TABS
50.0000 mg | ORAL_TABLET | Freq: Two times a day (BID) | ORAL | Status: DC
  Administered 2022-01-02 – 2022-01-07 (×11): 50 mg via ORAL
  Filled 2022-01-02: qty 1
  Filled 2022-01-02: qty 2
  Filled 2022-01-02 (×8): qty 1

## 2022-01-02 MED ORDER — GADOBUTROL 1 MMOL/ML IV SOLN
10.0000 mL | Freq: Once | INTRAVENOUS | Status: AC | PRN
  Administered 2022-01-02: 10 mL via INTRAVENOUS

## 2022-01-02 MED ORDER — FUROSEMIDE 40 MG PO TABS
40.0000 mg | ORAL_TABLET | Freq: Every morning | ORAL | Status: DC
  Administered 2022-01-02 – 2022-01-07 (×6): 40 mg via ORAL
  Filled 2022-01-02 (×2): qty 1
  Filled 2022-01-02: qty 2
  Filled 2022-01-02 (×3): qty 1

## 2022-01-02 MED ORDER — ASCORBIC ACID 500 MG PO TABS
500.0000 mg | ORAL_TABLET | Freq: Every day | ORAL | Status: DC
  Administered 2022-01-02 – 2022-01-07 (×6): 500 mg via ORAL
  Filled 2022-01-02 (×7): qty 1

## 2022-01-02 MED ORDER — APIXABAN 5 MG PO TABS
5.0000 mg | ORAL_TABLET | Freq: Two times a day (BID) | ORAL | Status: DC
  Administered 2022-01-02 – 2022-01-07 (×11): 5 mg via ORAL
  Filled 2022-01-02 (×11): qty 1

## 2022-01-02 MED ORDER — FLECAINIDE ACETATE 100 MG PO TABS
100.0000 mg | ORAL_TABLET | Freq: Two times a day (BID) | ORAL | Status: DC
  Administered 2022-01-02 – 2022-01-07 (×11): 100 mg via ORAL
  Filled 2022-01-02 (×12): qty 1

## 2022-01-02 MED ORDER — CEFEPIME HCL 2 G IV SOLR
2.0000 g | Freq: Once | INTRAVENOUS | Status: AC
  Administered 2022-01-02: 2 g via INTRAVENOUS
  Filled 2022-01-02: qty 12.5

## 2022-01-02 MED ORDER — SODIUM CHLORIDE 0.9 % IV BOLUS
500.0000 mL | Freq: Once | INTRAVENOUS | Status: AC
  Administered 2022-01-02: 500 mL via INTRAVENOUS

## 2022-01-02 MED ORDER — ACETAMINOPHEN 325 MG PO TABS: 650.0000 mg | ORAL_TABLET | Freq: Four times a day (QID) | ORAL | Status: DC | PRN

## 2022-01-02 MED ORDER — LOSARTAN POTASSIUM 50 MG PO TABS
50.0000 mg | ORAL_TABLET | Freq: Every day | ORAL | Status: DC
  Administered 2022-01-02 – 2022-01-07 (×6): 50 mg via ORAL
  Filled 2022-01-02 (×6): qty 1

## 2022-01-02 MED ORDER — VANCOMYCIN HCL 1250 MG/250ML IV SOLN
1250.0000 mg | Freq: Two times a day (BID) | INTRAVENOUS | Status: DC
Start: 2022-01-02 — End: 2022-01-05
  Administered 2022-01-03 – 2022-01-05 (×5): 1250 mg via INTRAVENOUS
  Filled 2022-01-02 (×8): qty 250

## 2022-01-02 MED ORDER — ACETAMINOPHEN 650 MG RE SUPP
650.0000 mg | Freq: Four times a day (QID) | RECTAL | Status: DC | PRN
Start: 2022-01-02 — End: 2022-01-07

## 2022-01-02 MED ORDER — ONDANSETRON HCL 4 MG PO TABS: 4.0000 mg | ORAL_TABLET | Freq: Four times a day (QID) | ORAL | Status: DC | PRN

## 2022-01-02 MED ORDER — ZINC GLUCONATE 50 MG PO TABS: 50.0000 mg | ORAL_TABLET | Freq: Every day | ORAL | Status: DC

## 2022-01-02 MED ORDER — SODIUM CHLORIDE 0.9 % IV SOLN
2.0000 g | Freq: Three times a day (TID) | INTRAVENOUS | Status: DC
  Administered 2022-01-02 – 2022-01-06 (×11): 2 g via INTRAVENOUS
  Filled 2022-01-02 (×11): qty 12.5

## 2022-01-02 MED ORDER — ESCITALOPRAM OXALATE 10 MG PO TABS
5.0000 mg | ORAL_TABLET | Freq: Every day | ORAL | Status: DC
  Administered 2022-01-02 – 2022-01-07 (×6): 5 mg via ORAL
  Filled 2022-01-02 (×6): qty 1

## 2022-01-02 MED ORDER — LEVOTHYROXINE SODIUM 75 MCG PO TABS
75.0000 ug | ORAL_TABLET | Freq: Every day | ORAL | Status: DC
  Administered 2022-01-02 – 2022-01-07 (×6): 75 ug via ORAL
  Filled 2022-01-02 (×6): qty 1

## 2022-01-02 MED ORDER — ONDANSETRON HCL 4 MG/2ML IJ SOLN: 4.0000 mg | Freq: Four times a day (QID) | INTRAMUSCULAR | Status: DC | PRN

## 2022-01-02 MED ORDER — VANCOMYCIN HCL 2000 MG/400ML IV SOLN
2000.0000 mg | Freq: Once | INTRAVENOUS | Status: AC
  Administered 2022-01-02: 2000 mg via INTRAVENOUS
  Filled 2022-01-02: qty 400

## 2022-01-02 NOTE — Assessment & Plan Note (Addendum)
Wound care consult. Was using dakin wet to dry dressings at home. Pt states wound is improving. Still with major tunneling according to wound care clinic notes from last month. ? ? ? ? ? ? ?

## 2022-01-02 NOTE — Assessment & Plan Note (Signed)
Stable. Continue eliquis 5 mg bid, flecainide 100 mg bid, lopressor 50 mg bid. Keep serum k >4.0 and Mg >2.0 ?

## 2022-01-02 NOTE — Subjective & Objective (Signed)
CC: left leg erythema, fever ?HPI: ?71 yo WF with hx of HTN, afib on eliquis, hypothyroidism, chronic diastolic heart failure, hypothyroidism, chronic left leg wound since February 2023 after a fall and hematoma who presents to the ER today with increasing erythema, fever and chills of the left thigh and left ankle.  Patient woke up on Thursday with some chills.  She had a fever by Friday morning.  By Saturday morning she had erythema on the left inner thigh which she has not had before.  She is also developed erythema and pain on the lateral side of the left ankle.  Her chronic wound on her left lateral lower leg actually looks better.  Has not been draining.  She has had some pain in her left thigh and left ankle that she noticed while walking today.  Her fever was 101 at home. ? ?Patient came to the ER for evaluation. ? ?On arrival temp 98.7 heart rate 63 blood pressure 157/57. ? ?Weight was 154.2 kg.  BMI is 53.24. ? ?Labs showed a white count of 15.3, he 110.5, platelets of 298 ? ?Sodium 130, potassium 5.0, BUN of 44, creatinine 1.1 ? ?Due to the patient's left leg cellulitis, leukocytosis, [facility] hospitalist contacted for admission. ?

## 2022-01-02 NOTE — ED Notes (Signed)
Patient transported to MRI 

## 2022-01-02 NOTE — TOC Initial Note (Signed)
Transition of Care (TOC) - Initial/Assessment Note  ? ? ?Patient Details  ?Name: [name] ?MRN: [id number] ?Date of Birth: Apr 30, 1951 ? ?Transition of Care (TOC) CM/SW Contact:    ?Lockie Pares, RN ?Phone Number: ?01/02/2022, 11:53 AM ? ?Clinical Narrative:                 ? ?Transition of Care Department [facility]) has reviewed patient and no TOC needs have been identified at this time. We will continue to monitor patient advancement through interdisciplinary progression rounds. If new patient transition needs arise, please place a TOC consult. ?  ?  ?  ?  ? ? ?Patient Goals and CMS Choice ?  ?  ?  ? ?Expected Discharge Plan and Services ?  ?  ?  ?  ?  ?                ?  ?  ?  ?  ?  ?  ?  ?  ?  ?  ? ?Prior Living Arrangements/Services ?  ?  ?  ?       ?  ?  ?  ?  ? ?Activities of Daily Living ?  ?  ? ?Permission Sought/Granted ?  ?  ?   ?   ?   ?   ? ?Emotional Assessment ?  ?  ?  ?  ?  ?  ? ?Admission diagnosis:  Left leg cellulitis [L03.116] ?Patient Active Problem List  ? Diagnosis Date Noted  ? Left leg cellulitis 01/02/2022  ? 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 Hyponatremia 01/02/2022  ? Fall   ? Left knee pain 10/19/2021  ? History of loop recorder - MDT 09/21/2021  ? Anxiety disorder 09/08/2021  ? Chronic diastolic heart failure (HCC) 09/08/2021  ? Recurrent major depression (HCC) 09/08/2021  ? Vitamin D deficiency 09/08/2021  ? Former Tobacco user 09/08/2021  ? 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 Paroxysmal atrial fibrillation (HCC) 09/08/2021  ? Gastroesophageal reflux disease 09/08/2021  ? Morbid obesity with BMI of 50.0-59.9, adult (HCC) 09/08/2021  ? 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 OA (osteoarthritis) of knee 04/04/2016  ? Essential hypertension 10/15/2015  ? Hypothyroidism 10/15/2015  ? Incarcerated umbilical hernia 10/12/2015  ? ?PCP:  [name], MD ?Pharmacy:   ?[facility] - [place], [place] - [address] ?[address] ?[address] ?[place] [address] ?Phone: [phone] Fax: [phone] ? ? ? ? ?Social Determinants of Health (SDOH) Interventions ?  ? ?Readmission Risk Interventions ?   ? View : No data to display.  ?  ?  ?  ? ? ? ?

## 2022-01-02 NOTE — ED Provider Notes (Addendum)
?[facility] EMERGENCY DEPARTMENT ?Provider Note ? ? ?CSN: [id number] ?Arrival date & time: 01/01/22  2026 ? ?  ? ?History ? ?Chief Complaint  ?Patient presents with  ? Wound Check  ? ? ?LACEE [name] is a 71 y.o. female. ? ?The history is provided by the patient and medical records.  ?Wound Check ? ?71 y.o. F with hx of PAF on eliquis, CHF, HTN, hypothyroidism, GERD, chronic wound to left lower leg for several months, currently followed by wound care here with concern for wound infection.  States wound to left lower leg is deep, sees wound care weekly for this and has dressing changes twice daily.  She reports Friday evening she started having chills and increased pain to the left leg.  Saturday had some transient AMS, improved when son started giving her lots of oral fluids.  Today she reports continued pain, increased redness and swelling to left leg.  She has not recent been on any abx.  She has had poor appetite but is drinking fluids.  Denies known fever.   ? ?Home Medications ?Prior to Admission medications   ?Medication Sig Start Date End Date Taking? Authorizing Provider  ?acetaminophen (TYLENOL) 500 MG tablet Take 1,000 mg by mouth every 6 (six) hours as needed for mild pain or headache.   Yes [provider]  ?apixaban (ELIQUIS) 5 MG TABS tablet TAKE 1 TABLET BY MOUTH 2 TIMES DAILY. ?Patient taking differently: Take 5 mg by mouth 2 (two) times daily. 09/13/21  Yes Duke Salvia, MD  ?escitalopram (LEXAPRO) 5 MG tablet Take 5 mg by mouth daily. 12/28/20  Yes [provider]  ?ferrous sulfate 325 (65 FE) MG tablet Take 325 mg by mouth 2 (two) times daily with a meal.   Yes [provider]  ?flecainide (TAMBOCOR) 100 MG tablet TAKE 1 TABLET BY MOUTH 2 TIMES A DAY ?Patient taking differently: Take 100 mg by mouth 2 (two) times daily. 10/04/21  Yes Duke Salvia, MD  ?furosemide (LASIX) 20 MG tablet Take 40 mg by mouth every morning.    Yes [provider]  ?levothyroxine (SYNTHROID, LEVOTHROID) 75 MCG tablet Take 75 mcg by mouth daily before breakfast.  09/07/15  Yes [provider]  ?losartan (COZAAR) 50 MG tablet Take 50 mg by mouth daily. 11/17/21  Yes [provider]  ?metoprolol tartrate (LOPRESSOR) 50 MG tablet Take 1 tablet (50 mg total) by mouth 2 (two) times daily. 09/21/21  Yes Duke Salvia, MD  ?vitamin C (ASCORBIC ACID) 500 MG tablet Take 500 mg by mouth daily.   Yes [provider]  ?zinc gluconate 50 MG tablet Take 50 mg by mouth daily.   Yes [provider]  ?losartan (COZAAR) 100 MG tablet Take 1 tablet (100 mg total) by mouth daily. ?Patient not taking: Reported on 01/02/2022 09/10/21   Almon Hercules, MD  ?   ? ?Allergies    ?Augmentin [amoxicillin-pot clavulanate] and Pneumococcal vaccines   ? ?Review of Systems   ?Review of Systems  ?Skin:  Positive for color change and wound.  ?All other systems reviewed and are negative. ? ?Physical Exam ?Updated Vital Signs ?BP 137/78 (BP Location: Left Arm)   Pulse (!) 57   Temp 98.7 ?F (37.1 ?C) (Oral)   Resp 18   Ht 5\' 7"  (1.702 m)   Wt (!) 154.2 kg   SpO2 96%   BMI 53.24 kg/m?  ?Physical Exam ?Vitals and nursing note reviewed.  ?Constitutional:   ?  Appearance: She is well-developed.  ?HENT:  ?   Head: Normocephalic and atraumatic.  ?Eyes:  ?   Conjunctiva/sclera: Conjunctivae normal.  ?   Pupils: Pupils are equal, round, and reactive to light.  ?Cardiovascular:  ?   Rate and Rhythm: Normal rate and regular rhythm.  ?   Heart sounds: Normal heart sounds.  ?Pulmonary:  ?   Effort: Pulmonary effort is normal.  ?   Breath sounds: Normal breath sounds.  ?Abdominal:  ?   General: Bowel sounds are normal.  ?   Palpations: Abdomen is soft.  ?Musculoskeletal:     ?   General: Normal range of motion.  ?   Cervical back: Normal range of motion.  ?   Comments: Chronic wound to left lower lateral leg; this is deep with packing in place; erythema extending up to medial thigh  and along posterior thigh as well but not completed circumferential, no tissue crepitus noted; erythema and swelling noted to left ankle/foot as well; DP pulse intact  ?Skin: ?   General: Skin is warm and dry.  ?Neurological:  ?   Mental Status: She is alert and oriented to person, place, and time.  ? ? ? ?ED Results / Procedures / Treatments   ?Labs ?(all labs ordered are listed, but only abnormal results are displayed) ?Labs Reviewed  ?COMPREHENSIVE METABOLIC PANEL - Abnormal; Notable for the following components:  ?    Result Value  ? Sodium 130 (*)   ? Chloride 97 (*)   ? Glucose, Bld 106 (*)   ? BUN 44 (*)   ? Creatinine, Ser 1.10 (*)   ? Albumin 3.1 (*)   ? GFR, Estimated 53 (*)   ? All other components within normal limits  ?CBC WITH DIFFERENTIAL/PLATELET - Abnormal; Notable for the following components:  ? WBC 15.3 (*)   ? RBC 3.76 (*)   ? Hemoglobin 10.5 (*)   ? HCT 32.9 (*)   ? Neutro Abs 13.2 (*)   ? Abs Immature Granulocytes 0.12 (*)   ? All other components within normal limits  ?PROTIME-INR - Abnormal; Notable for the following components:  ? Prothrombin Time 20.4 (*)   ? INR 1.8 (*)   ? All other components within normal limits  ?CULTURE, BLOOD (ROUTINE X 2)  ?CULTURE, BLOOD (ROUTINE X 2)  ?LACTIC ACID, PLASMA  ?URINALYSIS, ROUTINE W REFLEX MICROSCOPIC  ? ? ?EKG ?None ? ?Radiology ?DG Chest 2 View ? ?Result Date: 01/01/2022 ?CLINICAL DATA:  Left leg wound with possible sepsis EXAM: CHEST - 2 VIEW COMPARISON:  10/19/2021 FINDINGS: The heart size and mediastinal contours are within normal limits. Both lungs are clear. The visualized skeletal structures are unremarkable. IMPRESSION: No active cardiopulmonary disease. Electronically Signed   By: Alcide CleverMark  Lukens M.D.   On: 01/01/2022 22:11  ? ?DG Tibia/Fibula Left ? ?Result Date: 01/02/2022 ?CLINICAL DATA:  Wound EXAM: LEFT TIBIA AND FIBULA - 2 VIEW COMPARISON:  10/18/2021 FINDINGS: Soft tissue ulceration/defect noted in the lateral soft tissues of the proximal  left calf. Presumed packing material within the soft tissue defect. No underlying bony abnormality. No fracture, subluxation or dislocation. No destruction to suggest osteomyelitis. Changes of left knee replacement noted. IMPRESSION: Large soft tissue defect laterally in the proximal left calf soft tissues. No acute bony abnormality Electronically Signed   By: Charlett NoseKevin  Dover M.D.   On: 01/02/2022 02:18   ? ?Procedures ?Procedures  ? ?CRITICAL CARE ?Performed by: Garlon HatchetLisa M Grae Cannata ? ? ?Total critical care  time: 35 minutes ? ?Critical care time was exclusive of separately billable procedures and treating other patients. ? ?Critical care was necessary to treat or prevent imminent or life-threatening deterioration. ? ?Critical care was time spent personally by me on the following activities: development of treatment plan with patient and/or surrogate as well as nursing, discussions with consultants, evaluation of patient's response to treatment, examination of patient, obtaining history from patient or surrogate, ordering and performing treatments and interventions, ordering and review of laboratory studies, ordering and review of radiographic studies, pulse oximetry and re-evaluation of patient's condition. ? ? ?Medications Ordered in ED ?Medications  ?vancomycin (VANCOREADY) IVPB 2000 mg/400 mL (has no administration in time range)  ?ceFEPIme (MAXIPIME) 2 g in sodium chloride 0.9 % 100 mL IVPB (2 g Intravenous New Bag/Given 01/02/22 0314)  ?sodium chloride 0.9 % bolus 500 mL (500 mLs Intravenous New Bag/Given 01/02/22 0314)  ? ? ?ED Course/ Medical Decision Making/ A&P ?  ?                        ?Medical Decision Making ?Amount and/or Complexity of Data Reviewed ?Radiology: ordered and independent interpretation performed. ?ECG/medicine tests: ordered and independent interpretation performed. ? ?Risk ?Prescription drug management. ?Decision regarding hospitalization. ? ? ?71 y.o. F here with wound infection to left lower  leg.  Chronic lower leg wound for the past several months, followed by wound care clinic.  Friday started having increased pain/redness to the leg with chills.  Saturday some transient AMS but reportedly impro

## 2022-01-02 NOTE — Assessment & Plan Note (Signed)
Stable. continue synthroid 75 mcg daily ?

## 2022-01-02 NOTE — Assessment & Plan Note (Signed)
Stable

## 2022-01-02 NOTE — Assessment & Plan Note (Signed)
Stable. Euvolemic. Continue lasix 20 mg daily, losartan 50 mg daily. ?

## 2022-01-02 NOTE — Progress Notes (Signed)
No charge note ? ?Patient seen and examined this morning, admitted overnight, H&P reviewed and agree with the assessment and plan. ? ?71 year old female with HTN, A-fib on Eliquis, hypothyroidism, chronic diastolic CHF, hypothyroidism, morbid obesity, who comes into the hospital with a left lower extremity erythema, fever and chills.  She also has a chronic left leg wound since February after a fall and hematoma. ? ?Left lower extremity cellulitis-agree with antibiotics, she was febrile at home and the erythema was quite rapid onset.   ? ?Hyponatremia-monitor sodium ? ?Nonpressure chronic ulcer-wound care consult.  Given fever, chills, will MRI this area to ensure no deep abscesses are present ? ?Morbid obesity-BMI 53.  She would benefit from weight loss now ? ?Paroxysmal A-fib-continue home regimen ? ?Chronic diastolic CHF-continue home regimen ? ?Hypothyroidism-continue Synthroid ? ?Hypertension-continue home regimen ? ?Scheduled Meds: ? apixaban  5 mg Oral BID  ? vitamin C  500 mg Oral Daily  ? escitalopram  5 mg Oral Daily  ? flecainide  100 mg Oral BID  ? furosemide  40 mg Oral q morning  ? levothyroxine  75 mcg Oral QAC breakfast  ? losartan  50 mg Oral Daily  ? metoprolol tartrate  50 mg Oral BID  ? zinc gluconate  50 mg Oral Daily  ? ?Continuous Infusions: ?PRN Meds:.acetaminophen **OR** acetaminophen, ondansetron **OR** ondansetron (ZOFRAN) IV ? ?Debbera Wolken M. Cruzita Lederer, MD, PhD ?[facility] ? ?Between 7 am - 7 pm you can contact me via Amion (for emergencies) or Securechat (non urgent matters).  ?I am not available 7 pm - 7 am, please contact night coverage MD/APP via Amion ?

## 2022-01-02 NOTE — Assessment & Plan Note (Addendum)
Admit to med/surg bed. IV cefepime and vancomycin. Blood cx obtained in ER prior to ABX.  Area of erythema on the left thigh outlined in blue magic marker ? ? ? ? ? ? ?

## 2022-01-02 NOTE — Assessment & Plan Note (Signed)
Stable. Continue lasix 20 mg daily, losartan 50 mg daily, lopressor 50 mg bid. ?

## 2022-01-02 NOTE — H&P (Signed)
?History and Physical  ? ? ?[name] [id number] DOB: 1950/09/08 DOA: 01/01/2022 ? ?DOS: the patient was seen and examined on 01/01/2022 ? ?PCP: Deland Pretty, MD  ? ?Patient coming from: Home ? ?I have personally briefly reviewed patient's old medical records in [place] ? ?CC: left leg erythema, fever ?HPI: ?71 yo WF with hx of HTN, afib on eliquis, hypothyroidism, chronic diastolic heart failure, hypothyroidism, chronic left leg wound since February 2023 after a fall and hematoma who presents to the ER today with increasing erythema, fever and chills of the left thigh and left ankle.  Patient woke up on Thursday with some chills.  She had a fever by Friday morning.  By Saturday morning she had erythema on the left inner thigh which she has not had before.  She is also developed erythema and pain on the lateral side of the left ankle.  Her chronic wound on her left lateral lower leg actually looks better.  Has not been draining.  She has had some pain in her left thigh and left ankle that she noticed while walking today.  Her fever was 101 at home. ? ?Patient came to the ER for evaluation. ? ?On arrival temp 98.7 heart rate 63 blood pressure 157/57. ? ?Weight was 154.2 kg.  BMI is 53.24. ? ?Labs showed a white count of 15.3, he 110.5, platelets of 298 ? ?Sodium 130, potassium 5.0, BUN of 44, creatinine 1.1 ? ?Due to the patient's left leg cellulitis, leukocytosis, [facility] contacted for admission.  ? ?ED Course: WBC 15K, blood cultures obtained. IV cefepime and vanco started. ? ?Review of Systems:  ?Review of Systems  ?Constitutional:  Positive for chills and fever.  ?HENT: Negative.    ?Eyes: Negative.   ?Respiratory: Negative.    ?Cardiovascular: Negative.   ?Gastrointestinal: Negative.   ?Genitourinary: Negative.   ?Musculoskeletal: Negative.   ?Skin:   ?     Erythema inner left thigh, outer left ankle  ?Neurological: Negative.   ?Endo/Heme/Allergies: Negative.    ?Psychiatric/Behavioral: Negative.    ? ?Past Medical History:  ?Diagnosis Date  ? Acute respiratory disease due to COVID-19 virus 09/08/2021  ? Anxiety disorder 09/08/2021  ? Arthritis   ? CHF (congestive heart failure) ([place])   ? Chronic diastolic heart failure ([place]) 09/08/2021  ? Chronic kidney disease due to hypertension 09/08/2021  ? Class 3 obesity ([place]) 09/08/2021  ? Essential hypertension 10/15/2015  ? Gastroesophageal reflux disease 09/08/2021  ? Heart murmur   ? Hypertension   ? Hypothyroidism 10/15/2015  ? 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 OA (osteoarthritis) of knee 04/04/2016  ? Paroxysmal atrial fibrillation ([place]) 09/08/2021  ? Tobacco user 09/08/2021  ? Transfusion history   ? '77 "pt has positive antibodies history"  ? Vitamin D deficiency 09/08/2021  ? ? ?Past Surgical History:  ?Procedure Laterality Date  ? CARDIOVERSION N/A 02/16/2021  ? Procedure: CARDIOVERSION;  Surgeon: Deboraha Sprang, MD;  Location: [facility];  Service: Cardiovascular;  Laterality: N/A;  ? CHOLECYSTECTOMY    ? DILATION AND CURETTAGE OF UTERUS    ? LOOP RECORDER INSERTION N/A 12/21/2017  ? Procedure: LOOP RECORDER INSERTION;  Surgeon: Deboraha Sprang, MD;  Location: [place] [facility];  Service: Cardiovascular;  Laterality: N/A;  ? OMENTECTOMY N/A 10/12/2015  ? Procedure: PARTIAL OMENTECTOMY;  Surgeon: Georganna Skeans, MD;  Location: [place];  Service: General;  Laterality: N/A;  ? TOTAL KNEE ARTHROPLASTY Left 04/04/2016  ? Procedure: LEFT TOTAL KNEE ARTHROPLASTY;  Surgeon: Gaynelle Arabian, MD;  Location: [facility];  Service: Orthopedics;  Laterality: Left;  ? TOTAL KNEE ARTHROPLASTY Right 08/01/2016  ? Procedure: TOTAL KNEE ARTHROPLASTY;  Surgeon: Gaynelle Arabian, MD;  Location: [facility];  Service: Orthopedics;  Laterality: Right;  ? UMBILICAL HERNIA REPAIR N/A 10/12/2015  ? Procedure: HERNIA REPAIR UMBILICAL ADULT/INCARERATED;  Surgeon: Georganna Skeans, MD;  Location: [place];  Service: General;  Laterality: N/A;  ? ? ? reports that she quit smoking about  34 years ago. Her smoking use included cigarettes. She has never used smokeless tobacco. She reports that she does not drink alcohol and does not use drugs. ? ?Allergies  ?Allergen Reactions  ? Augmentin [Amoxicillin-Pot Clavulanate] Hives and Itching  ?  Has patient had a PCN reaction causing immediate rash, facial/tongue/throat swelling, SOB or lightheadedness with hypotension:No ?Has patient had a PCN reaction causing severe rash involving mucus membranes or skin necrosis:No ?Has patient had a PCN reaction that required hospitalization:No ?Has patient had a PCN reaction occurring within the last 10 years:No ?If all of the above answers are "NO", then may proceed with Cephalosporin use. ?  ? Pneumococcal Vaccines Other (See Comments)  ?  Caused fever, and swelling at injection site  ? ? ?Family History  ?Problem Relation Age of Onset  ? Rheum arthritis Mother   ? Heart attack Father   ? Heart disease Father   ? ? ?Prior to Admission medications   ?Medication Sig Start Date End Date Taking? Authorizing Provider  ?acetaminophen (TYLENOL) 500 MG tablet Take 1,000 mg by mouth every 6 (six) hours as needed for mild pain or headache.   Yes [provider]  ?apixaban (ELIQUIS) 5 MG TABS tablet TAKE 1 TABLET BY MOUTH 2 TIMES DAILY. ?Patient taking differently: Take 5 mg by mouth 2 (two) times daily. 09/13/21  Yes Deboraha Sprang, MD  ?escitalopram (LEXAPRO) 5 MG tablet Take 5 mg by mouth daily. 12/28/20  Yes [provider]  ?ferrous sulfate 325 (65 FE) MG tablet Take 325 mg by mouth 2 (two) times daily with a meal.   Yes [provider]  ?flecainide (TAMBOCOR) 100 MG tablet TAKE 1 TABLET BY MOUTH 2 TIMES A DAY ?Patient taking differently: Take 100 mg by mouth 2 (two) times daily. 10/04/21  Yes Deboraha Sprang, MD  ?furosemide (LASIX) 20 MG tablet Take 40 mg by mouth every morning.    Yes [provider]  ?levothyroxine (SYNTHROID, LEVOTHROID) 75 MCG tablet Take 75 mcg by mouth daily before  breakfast.  09/07/15  Yes [provider]  ?losartan (COZAAR) 50 MG tablet Take 50 mg by mouth daily. 11/17/21  Yes [provider]  ?metoprolol tartrate (LOPRESSOR) 50 MG tablet Take 1 tablet (50 mg total) by mouth 2 (two) times daily. 09/21/21  Yes Deboraha Sprang, MD  ?vitamin C (ASCORBIC ACID) 500 MG tablet Take 500 mg by mouth daily.   Yes [provider]  ?zinc gluconate 50 MG tablet Take 50 mg by mouth daily.   Yes [provider]  ?losartan (COZAAR) 100 MG tablet Take 1 tablet (100 mg total) by mouth daily. ?Patient not taking: Reported on 01/02/2022 09/10/21   Mercy Riding, MD  ? ? ?Physical Exam: ?Vitals:  ? 01/01/22 2030 01/01/22 2127 01/02/22 0054  ?BP: (!) 157/58  137/78  ?Pulse: 63  (!) 57  ?Resp: (!) 24  18  ?Temp: 98.7 ?F (37.1 ?C)    ?TempSrc: Oral    ?SpO2: 94%  96%  ?  Weight:  (!) 154.2 kg   ?Height:  5\' 7"  (1.702 m)   ? ? ?Physical Exam ?Vitals and nursing note reviewed.  ?Constitutional:   ?   General: She is not in acute distress. ?   Appearance: She is obese. She is not ill-appearing, toxic-appearing or diaphoretic.  ?HENT:  ?   Head: Normocephalic and atraumatic.  ?   Nose: Nose normal.  ?Eyes:  ?   Pupils: Pupils are equal, round, and reactive to light.  ?Cardiovascular:  ?   Rate and Rhythm: Normal rate and regular rhythm.  ?Pulmonary:  ?   Effort: Pulmonary effort is normal. No respiratory distress.  ?   Breath sounds: No wheezing.  ?Abdominal:  ?   General: Bowel sounds are normal. There is no distension.  ?   Tenderness: There is no abdominal tenderness. There is no guarding.  ?Skin: ?   Capillary Refill: Capillary refill takes less than 2 seconds.  ?   Comments: See pictures of the wound, left thigh erythema, left ankle erythema  ?Neurological:  ?   General: No focal deficit present.  ?   Mental Status: She is alert and oriented to person, place, and time.  ?  ? ? ? ? ? ? ? ? ?Labs on Admission: I have personally reviewed following labs and imaging  studies ? ?CBC: ?Recent Labs  ?Lab 01/01/22 ?2202  ?WBC 15.3*  ?NEUTROABS 13.2*  ?HGB 10.5*  ?HCT 32.9*  ?MCV 87.5  ?PLT 298  ? ?Basic Metabolic Panel: ?Recent Labs  ?Lab 01/01/22 ?2202  ?NA 130*  ?K 5.0  ?CL 97*  ?CO2 23  ?GLUCO

## 2022-01-02 NOTE — Assessment & Plan Note (Signed)
Likely due to diuresis from lasix. Will check TSH. Pt asymptomatic. ?

## 2022-01-02 NOTE — Progress Notes (Signed)
Pharmacy Antibiotic Note ? ?[name] is a 71 y.o. female admitted on 01/01/2022 with  wound infection .  Pharmacy has been consulted for Cefepime and vancomycin dosing. ? ?WBC elevated, SCr mildly elevated  ? ?Plan: ?-Cefepime 2 gm IV Q 8 hours ?-Vancomycin 2 gm IV load followed by Vancomycin 1250 mg IV Q 12 hrs. Goal AUC 400-550. ?Expected AUC: 513 ?SCr used: 1.1 ?-Monitor CBC, renal fx, cultures and clinical progress ?-Vanc levels as indicated  ? ? ?Height: 5\' 7"  (170.2 cm) ?Weight: (!) 154.2 kg (339 lb 15.2 oz) ?IBW/kg (Calculated) : 61.6 ? ?Temp (24hrs), Avg:98.7 ?F (37.1 ?C), Min:98.7 ?F (37.1 ?C), Max:98.7 ?F (37.1 ?C) ? ?Recent Labs  ?Lab 01/01/22 ?2202 01/01/22 ?2203  ?WBC 15.3*  --   ?CREATININE 1.10*  --   ?LATICACIDVEN  --  1.0  ?  ?Estimated Creatinine Clearance: 70.9 mL/min (A) (by C-G formula based on SCr of 1.1 mg/dL (H)).   ? ?Allergies  ?Allergen Reactions  ? Augmentin [Amoxicillin-Pot Clavulanate] Hives and Itching  ?  Has patient had a PCN reaction causing immediate rash, facial/tongue/throat swelling, SOB or lightheadedness with hypotension:No ?Has patient had a PCN reaction causing severe rash involving mucus membranes or skin necrosis:No ?Has patient had a PCN reaction that required hospitalization:No ?Has patient had a PCN reaction occurring within the last 10 years:No ?If all of the above answers are "NO", then may proceed with Cephalosporin use. ?  ? Pneumococcal Vaccines Other (See Comments)  ?  Caused fever, and swelling at injection site  ? ? ?Antimicrobials this admission: ?Cefepime 5/14 >>  ?Vancomycin  5/14 >>  ? ?Dose adjustments this admission: ? ? ?Microbiology results: ?5/13 BCx:  ?5/3 UCx:   ? ? ?Thank you for allowing pharmacy to be a part of this patientChristines care. ? ? ?7/3, PharmD., BCCCP ?Clinical Pharmacist ?Please refer to AMION for unit-specific pharmacist  ? ? ?

## 2022-01-02 NOTE — Assessment & Plan Note (Signed)
Chronic. BMI 53 ?

## 2022-01-03 DIAGNOSIS — L03116 Cellulitis of left lower limb: Secondary | ICD-10-CM | POA: Diagnosis not present

## 2022-01-03 LAB — CBC WITH DIFFERENTIAL/PLATELET
Abs Immature Granulocytes: 0.05 10*3/uL (ref 0.00–0.07)
Basophils Absolute: 0.1 10*3/uL (ref 0.0–0.1)
Basophils Relative: 1 %
Eosinophils Absolute: 0.2 10*3/uL (ref 0.0–0.5)
Eosinophils Relative: 2 %
HCT: 29.4 % — ABNORMAL LOW (ref 36.0–46.0)
Hemoglobin: 9.2 g/dL — ABNORMAL LOW (ref 12.0–15.0)
Immature Granulocytes: 1 %
Lymphocytes Relative: 14 %
Lymphs Abs: 1.4 10*3/uL (ref 0.7–4.0)
MCH: 27.6 pg (ref 26.0–34.0)
MCHC: 31.3 g/dL (ref 30.0–36.0)
MCV: 88.3 fL (ref 80.0–100.0)
Monocytes Absolute: 0.9 10*3/uL (ref 0.1–1.0)
Monocytes Relative: 9 %
Neutro Abs: 7.1 10*3/uL (ref 1.7–7.7)
Neutrophils Relative %: 73 %
Platelets: 264 10*3/uL (ref 150–400)
RBC: 3.33 MIL/uL — ABNORMAL LOW (ref 3.87–5.11)
RDW: 15.3 % (ref 11.5–15.5)
WBC: 9.6 10*3/uL (ref 4.0–10.5)
nRBC: 0 % (ref 0.0–0.2)

## 2022-01-03 LAB — COMPREHENSIVE METABOLIC PANEL
ALT: 15 U/L (ref 0–44)
AST: 18 U/L (ref 15–41)
Albumin: 2.4 g/dL — ABNORMAL LOW (ref 3.5–5.0)
Alkaline Phosphatase: 52 U/L (ref 38–126)
Anion gap: 7 (ref 5–15)
BUN: 34 mg/dL — ABNORMAL HIGH (ref 8–23)
CO2: 24 mmol/L (ref 22–32)
Calcium: 8.5 mg/dL — ABNORMAL LOW (ref 8.9–10.3)
Chloride: 105 mmol/L (ref 98–111)
Creatinine, Ser: 1.09 mg/dL — ABNORMAL HIGH (ref 0.44–1.00)
GFR, Estimated: 54 mL/min — ABNORMAL LOW (ref >60)
Glucose, Bld: 107 mg/dL — ABNORMAL HIGH (ref 70–99)
Potassium: 4.3 mmol/L (ref 3.5–5.1)
Sodium: 136 mmol/L (ref 135–145)
Total Bilirubin: 0.7 mg/dL (ref 0.3–1.2)
Total Protein: 6.2 g/dL — ABNORMAL LOW (ref 6.5–8.1)

## 2022-01-03 LAB — MAGNESIUM: Magnesium: 2.1 mg/dL (ref 1.7–2.4)

## 2022-01-03 MED ORDER — DAKINS (1/4 STRENGTH) 0.125 % EX SOLN
Freq: Two times a day (BID) | CUTANEOUS | Status: AC
  Filled 2022-01-03: qty 473

## 2022-01-03 NOTE — Progress Notes (Incomplete)
Pt wound care perform with packing gauze and Dakin's solution with 4 x 4, Kerlix, and ace wrap. The wound is measuring at 9 cm length, 5 cm width, and 3 cm deep with sanguineous drainage. ?

## 2022-01-03 NOTE — TOC Initial Note (Signed)
Transition of Care (TOC) - Initial/Assessment Note  ? ? ?Patient Details  ?Name: [name] ?MRN: [id number] ?Date of Birth: [birth year] ? ?Transition of Care (TOC) CM/SW Contact:    ?[name], RN ?Phone Number: ?01/03/2022, 1:59 PM ? ?Clinical Narrative:                 ?Spoke to patient at bedside. Confirmed face sheet information. PAtient from home with son and daughter in law. Daughter in law assists with dressing changes and patient is active with [facility] and would like to remain receiving their services. Entered  resumption of care orders  for MD signature. Brandi with [facility] aware.  ? ?Patient has rolling walker at home already  ? ?Expected Discharge Plan: Home w Home Health Services ?Barriers to Discharge: Continued Medical Work up ? ? ?Patient Goals and CMS Choice ?Patient states their goals for this hospitalization and ongoing recovery are:: to return to home ?CMS Medicare.gov Compare Post Acute Care list provided to:: Patient ?Choice offered to / list presented to : Patient ? ?Expected Discharge Plan and Services ?Expected Discharge Plan: Home w Home Health Services ?  ?Discharge Planning Services: CM Consult ?Post Acute Care Choice: Home Health ?Living arrangements for the past 2 months: Single Family Home ?                ?DME Arranged: N/A ?  ?  ?  ?  ?HH Arranged: PT, RN ?HH Agency: [facility] ?Date HH Agency Contacted: 01/03/22 ?Time HH Agency Contacted: 1359 ?Representative spoke with at [facility] Agency: Merry Proud ? ?Prior Living Arrangements/Services ?Living arrangements for the past 2 months: Single Family Home ?Lives with:: Adult Children ?Patient language and need for interpreter reviewed:: Yes ?Do you feel safe going back to the place where you live?: Yes      ?Need for Family Participation in Patient Care: Yes (Comment) ?Care giver support system in place?: Yes (comment) ?Current home services: DME ?Criminal Activity/Legal Involvement Pertinent to Current  Situation/Hospitalization: No - Comment as needed ? ?Activities of Daily Living ?  ?  ? ?Permission Sought/Granted ?  ?Permission granted to share information with : No ?   ?   ?   ?   ? ?Emotional Assessment ?Appearance:: Appears stated age ?Attitude/Demeanor/Rapport: Engaged ?Affect (typically observed): Accepting ?Orientation: : Oriented to Self, Oriented to Place, Oriented to  Time, Oriented to Situation ?Alcohol / Substance Use: Not Applicable ?Psych Involvement: No (comment) ? ?Admission diagnosis:  Wound infection [T14.8XXA, L08.9] ?Left leg cellulitis [L03.116] ?Patient Active Problem List  ? Diagnosis Date Noted  ? Left leg cellulitis 01/02/2022  ? 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 Hyponatremia 01/02/2022  ? Fall   ? Left knee pain 10/19/2021  ? History of loop recorder - MDT 09/21/2021  ? Anxiety disorder 09/08/2021  ? Chronic diastolic heart failure (HCC) 09/08/2021  ? Recurrent major depression (HCC) 09/08/2021  ? Vitamin D deficiency 09/08/2021  ? Former Tobacco user 09/08/2021  ? 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 Paroxysmal atrial fibrillation (HCC) 09/08/2021  ? Gastroesophageal reflux disease 09/08/2021  ? Morbid obesity with BMI of 50.0-59.9, adult (HCC) 09/08/2021  ? 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 OA (osteoarthritis) of knee 04/04/2016  ? Essential hypertension 10/15/2015  ? Hypothyroidism 10/15/2015  ? Incarcerated umbilical hernia 10/12/2015  ? ?PCP:  [name], MD ?Pharmacy:   ?[facility] - [place], [place] - [address] ?  [address] ?[address] ?[place] [address] ?Phone: [phone] Fax: [phone] ? ? ? ? ?Social Determinants of Health (SDOH) Interventions ?  ? ?Readmission Risk Interventions ?   ? View : No data to display.  ?  ?  ?  ? ? ? ?

## 2022-01-03 NOTE — Evaluation (Signed)
Physical Therapy Evaluation ?Patient Details ?Name: [name] ?MRN: [id number] ?DOB: Jul 21, 1951 ?Today's Date: 01/03/2022 ? ?History of Present Illness ? Pt is a 70 y.o. F who presents 01/01/2022 with increasing erythema of left thigh and left ankle, fever, and chills. Significant PMH: HTN, afib on Eliquis, chronic diastolic heart failure, chronic LLE wound.  ?Clinical Impression ? PTA, pt lives with her family, is a household ambulator with a RW, and requires assist for LB ADL's and wound care. Pt is likely fairly close to her functional baseline. Denies LLE pain. Pt ambulating 120 ft with a walker at a supervision level. Displays generalized weakness, gait abnormalities, decreased skin integrity, decreased cardiopulmonary endurance, balance deficits. Would benefit from continued HHPT at discharge to address. ?   ? ?Recommendations for follow up therapy are one component of a multi-disciplinary discharge planning process, led by the attending physician.  Recommendations may be updated based on patient status, additional functional criteria and insurance authorization. ? ?Follow Up Recommendations Home health PT (active with [facility]) ? ?  ?Assistance Recommended at Discharge PRN  ?Patient can return home with the following ? A little help with walking and/or transfers;A little help with bathing/dressing/bathroom;Assistance with cooking/housework;Assist for transportation;Help with stairs or ramp for entrance ? ?  ?Equipment Recommendations None recommended by PT  ?Recommendations for Other Services ?    ?  ?Functional Status Assessment Patient has had a recent decline in their functional status and demonstrates the ability to make significant improvements in function in a reasonable and predictable amount of time.  ? ?  ?Precautions / Restrictions Precautions ?Precautions: Fall ?Restrictions ?Weight Bearing Restrictions: No  ? ?  ? ?Mobility ? Bed Mobility ?Overal bed mobility: Modified Independent ?  ?  ?  ?   ?  ?  ?General bed mobility comments: HOB elevated, use of bed rails, increased time/effort. Exiting towards right side of bed ?  ? ?Transfers ?Overall transfer level: Needs assistance ?Equipment used: Rolling walker (2 wheels) ?Transfers: Sit to/from Stand ?Sit to Stand: Supervision ?  ?  ?  ?  ?  ?General transfer comment: Supervision for safety from elevated bed height ?  ? ?Ambulation/Gait ?Ambulation/Gait assistance: Supervision ?Gait Distance (Feet): 120 Feet ?Assistive device: Rolling walker (2 wheels) ?Gait Pattern/deviations: Step-through pattern, Decreased stride length, Decreased dorsiflexion - right, Decreased dorsiflexion - left ?Gait velocity: decreased ?  ?  ?General Gait Details: Decreased bilateral foot clearance with fatigue, increased trunk flexion, supervision for safety ? ?Stairs ?  ?  ?  ?  ?  ? ?Wheelchair Mobility ?  ? ?Modified Rankin (Stroke Patients Only) ?  ? ?  ? ?Balance Overall balance assessment: Mild deficits observed, not formally tested ?  ?  ?  ?  ?  ?  ?  ?  ?  ?  ?  ?  ?  ?  ?  ?  ?  ?  ?   ? ? ? ?Pertinent Vitals/Pain Pain Assessment ?Pain Assessment: No/denies pain  ? ? ?Home Living Family/patient expects to be discharged to:: Private residence ?Living Arrangements: Children ?Available Help at Discharge: Family ?Type of Home: House ?Home Access: Ramped entrance ?  ?  ?  ?Home Layout: One level ?Home Equipment: Conservation officer, nature (2 wheels);Rollator (4 wheels);Cane - single point;Toilet riser;Shower seat - built in;Grab bars - toilet;Other (comment) ?Additional Comments: has lift chair  ?  ?Prior Function Prior Level of Function : Needs assist ?  ?  ?  ?  ?  ?  ?Mobility  Comments: HHPT through [place], household distances with RW, has lift chair ?ADLs Comments: bird baths, DIL assists with LLE dressing changes BID and lower body dressing/bathing ?  ? ? ?Hand Dominance  ?   ? ?  ?Extremity/Trunk Assessment  ? Upper Extremity Assessment ?Upper Extremity Assessment: Generalized  weakness ?  ? ?Lower Extremity Assessment ?Lower Extremity Assessment: Generalized weakness ?  ? ?Cervical / Trunk Assessment ?Cervical / Trunk Assessment: Other exceptions ?Cervical / Trunk Exceptions: increased body habitus  ?Communication  ? Communication: No difficulties  ?Cognition Arousal/Alertness: Awake/alert ?Behavior During Therapy: [facility] for tasks assessed/performed ?Overall Cognitive Status: Within Functional Limits for tasks assessed ?  ?  ?  ?  ?  ?  ?  ?  ?  ?  ?  ?  ?  ?  ?  ?  ?  ?  ?  ? ?  ?General Comments  SpO2 94% on RA, HR 84 ? ?  ?Exercises    ? ?Assessment/Plan  ?  ?PT Assessment Patient needs continued PT services  ?PT Problem List Decreased strength;Decreased activity tolerance;Decreased balance;Decreased mobility;Obesity;Decreased skin integrity ? ?   ?  ?PT Treatment Interventions DME instruction;Gait training;Functional mobility training;Therapeutic activities;Therapeutic exercise;Balance training;Patient/family education   ? ?PT Goals (Current goals can be found in the Care Plan section)  ?Acute Rehab PT Goals ?Patient Stated Goal: get outside and garden ?PT Goal Formulation: With patient ?Time For Goal Achievement: 01/17/22 ?Potential to Achieve Goals: Good ? ?  ?Frequency Min 3X/week ?  ? ? ?Co-evaluation   ?  ?  ?  ?  ? ? ?  ?AM-PAC PT "6 Clicks" Mobility  ?Outcome Measure Help needed turning from your back to your side while in a flat bed without using bedrails?: None ?Help needed moving from lying on your back to sitting on the side of a flat bed without using bedrails?: None ?Help needed moving to and from a bed to a chair (including a wheelchair)?: A Little ?Help needed standing up from a chair using your arms (e.g., wheelchair or bedside chair)?: A Little ?Help needed to walk in hospital room?: A Little ?Help needed climbing 3-5 steps with a railing? : A Lot ?6 Click Score: 19 ? ?  ?End of Session   ?Activity Tolerance: Patient tolerated treatment well ?Patient left: in chair;with  call bell/phone within reach;with chair alarm set ?Nurse Communication: Mobility status ?PT Visit Diagnosis: Unsteadiness on feet (R26.81);Muscle weakness (generalized) (M62.81);Difficulty in walking, not elsewhere classified (R26.2) ?  ? ?Time: [id number] ?PT Time Calculation (min) (ACUTE ONLY): 27 min ? ? ?Charges:   PT Evaluation ?$PT Eval Low Complexity: 1 Low ?PT Treatments ?$Therapeutic Activity: 8-22 mins ?  ?   ?Wyona Almas, PT, DPT ?Acute Rehabilitation Services ?Pager [phone] ?Office [phone] ? ? ?[name] ?01/03/2022, 8:53 AM ? ?

## 2022-01-03 NOTE — Consult Note (Signed)
WOC Nurse Consult Note: ?Patient receiving care in [facility] [id number] ?Reason for Consult: left lower leg wound. was using dakins wet to dry at home. 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 Pat dry with sterile 4 x 4 then Moisten the plain packing gauze [place] # [phone]) with Dakins and pack loosly into the the wound leaving a tail, then moisten a 4 x 4 with Dakin's and place over the remaining wound. Cover with ABD pads and wrap with Kerlix and Ace Wrap. Change twice daily.  ? ?Monitor the wound area(s) for worsening of condition such as: ?Signs/symptoms of infection, increase in size, development of or worsening of odor, ?development of pain, or increased pain at the affected locations.   ?Notify the medical team if any of these develop. ? ?Thank you for the consult. WOC nurse will not follow at this time.   ?Please re-consult the WOC team if needed. ? ?Renaldo Reel. Katrinka Blazing, MSN, RN, CMSRN, AGCNS, WTA ?Wound Treatment Associate ?Pager [phone]   ? ?  ?

## 2022-01-03 NOTE — Progress Notes (Signed)
?PROGRESS NOTE ? ?[name] [id number] DOB: 09/16/1950 DOA: 01/01/2022 ?PCP: Deland Pretty, MD ? ? LOS: 1 day  ? ?Brief Narrative / Interim history: ?71 year old female with HTN, A-fib on Eliquis, hypothyroidism, chronic diastolic CHF, hypothyroidism, morbid obesity, who comes into the hospital with a left lower extremity erythema, fever and chills.  She also has a chronic left leg wound since February after a fall and hematoma. ? ?Subjective / 24h Interval events: ?Doing better this morning.  Has some itchiness at the site of the cellulitic area.  No chest pain, no shortness of breath. ? ?Assesement and Plan: ?Principal Problem: ?  Left leg cellulitis ?Active Problems: ?  Essential hypertension ?  Hypothyroidism ?  Chronic diastolic heart failure ([place]) ?  Paroxysmal atrial fibrillation (HCC) ?  Gastroesophageal reflux disease ?  Morbid obesity with BMI of 50.0-59.9, adult ([place]) ?  Non-pressure chronic ulcer of other part of left lower leg with fat layer exposed ([place]) ?  Hyponatremia ? ? ?Principal problem ?Left lower extremity cellulitis-patient was started on broad-spectrum antibiotics, continue.  An MRI done fortunately did not show any abscesses but it did show severe cellulitis involving the lower half of the left lower extremity.  Erythema still within margins, has not extended and while not really receeding yet, it is less red today.  Still painful and warm ? ?Active problems ?Hyponatremia-sodium normalized with fluids ? ?Nonpressure chronic ulcer-wound care consult.  MRI did not show any significant abscess or concern for osteomyelitis ?  ?Morbid obesity-BMI 53.  She would benefit from significant weight loss.  She recently lost 25 pounds and is taking measures to lose more. ?  ?Paroxysmal A-fib-continue home regimen with Eliquis, metoprolol ?  ?Chronic diastolic CHF-continue home regimen with furosemide, losartan, metoprolol.  Clinically appears euvolemic ?  ?Hypothyroidism-continue Synthroid ?   ?Hypertension-continue home regimen.  Blood pressure stable this morning ? ?Scheduled Meds: ? apixaban  5 mg Oral BID  ? vitamin C  500 mg Oral Daily  ? escitalopram  5 mg Oral Daily  ? flecainide  100 mg Oral BID  ? furosemide  40 mg Oral q morning  ? levothyroxine  75 mcg Oral QAC breakfast  ? losartan  50 mg Oral Daily  ? metoprolol tartrate  50 mg Oral BID  ? sodium hypochlorite   Irrigation BID  ? ?Continuous Infusions: ? ceFEPime (MAXIPIME) IV 2 g (01/03/22 [id number])  ? vancomycin 1,250 mg (01/03/22 0405)  ? ?PRN Meds:.acetaminophen **OR** acetaminophen, ondansetron **OR** ondansetron (ZOFRAN) IV ? ?Diet Orders (From admission, onward)  ? ?  Start     Ordered  ? 01/02/22 0353  Diet Heart Room service appropriate? Yes; Fluid consistency: Thin  Diet effective now       ?Question Answer Comment  ?Room service appropriate? Yes   ?Fluid consistency: Thin   ?  ? 01/02/22 0352  ? ?  ?  ? ?  ? ? ?DVT prophylaxis:  ?apixaban (ELIQUIS) tablet 5 mg  ? ?Lab Results  ?Component Value Date  ? PLT 264 01/03/2022  ? ? ?  Code Status: Full Code ? ?Family Communication: No family at bedside ? ?Status is: Inpatient ? ?Remains inpatient appropriate because: needs IV antibiotics  ? ?Level of care: Med-Surg ? ?Consultants:  ?none ? ?Procedures:  ?none ? ?Microbiology  ?Blood cultures 5/13-no growth yet ? ?Antimicrobials: ?Vancomycin/cefepime 5/14 >> ? ? ?Objective: ?Vitals:  ? 01/02/22 1800 01/02/22 2055 01/03/22 0409 01/03/22 0843  ?BP: 138/72 127/64 (!) 129/50 (!) 142/66  ?  Pulse: 71 69 63 69  ?Resp: 16 20 20 18   ?Temp: 98.3 ?F (36.8 ?C) 98.4 ?F (36.9 ?C) 98.4 ?F (36.9 ?C) 98.3 ?F (36.8 ?C)  ?TempSrc: Oral Oral Oral Oral  ?SpO2: 99% 97% 95% 97%  ?Weight:      ?Height:      ? ? ?Intake/Output Summary (Last 24 hours) at 01/03/2022 1102 ?Last data filed at 01/03/2022 [id number] ?Gross per 24 hour  ?Intake 350 ml  ?Output --  ?Net 350 ml  ? ?Wt Readings from Last 3 Encounters:  ?01/01/22 (!) 154.2 kg  ?11/20/21 (!) 154.2 kg  ?10/24/21 (!) 165 kg   ? ? ?Examination: ? ?Constitutional: NAD ?Eyes: no scleral icterus ?ENMT: Mucous membranes are moist.  ?Neck: normal, supple ?Respiratory: clear to auscultation bilaterally, no wheezing, no crackles.  ?Cardiovascular: Regular rate and rhythm, no murmurs / rubs / gallops. No LE edema.  ?Abdomen: non distended, no tenderness. Bowel sounds positive.  ?Musculoskeletal: no clubbing / cyanosis.  ?Skin: Left lateral LE wound, left medial LE cellulitic area within margins drawn yesterday ?Neurologic: Nonfocal ? ?Data Reviewed: I have independently reviewed following labs and imaging studies  ? ?CBC ?Recent Labs  ?Lab 01/01/22 ?2202 01/03/22 ?[id number]  ?WBC 15.3* 9.6  ?HGB 10.5* 9.2*  ?HCT 32.9* 29.4*  ?PLT 298 264  ?MCV 87.5 88.3  ?MCH 27.9 27.6  ?MCHC 31.9 31.3  ?RDW 15.1 15.3  ?LYMPHSABS 1.0 1.4  ?MONOABS 0.8 0.9  ?EOSABS 0.2 0.2  ?BASOSABS 0.1 0.1  ? ? ?Recent Labs  ?Lab 01/01/22 ?2202 01/01/22 ?2203 01/03/22 ?[id number]  ?NA 130*  --  136  ?K 5.0  --  4.3  ?CL 97*  --  105  ?CO2 23  --  24  ?GLUCOSE 106*  --  107*  ?BUN 44*  --  34*  ?CREATININE 1.10*  --  1.09*  ?CALCIUM 8.9  --  8.5*  ?AST 21  --  18  ?ALT 16  --  15  ?ALKPHOS 72  --  52  ?BILITOT 0.8  --  0.7  ?ALBUMIN 3.1*  --  2.4*  ?MG  --   --  2.1  ?LATICACIDVEN  --  1.0  --   ?INR 1.8*  --   --   ? ? ?------------------------------------------------------------------------------------------------------------------ ?No results for input(s): CHOL, HDL, LDLCALC, TRIG, CHOLHDL, LDLDIRECT in the last 72 hours. ? ?No results found for: HGBA1C ?------------------------------------------------------------------------------------------------------------------ ?No results for input(s): TSH, T4TOTAL, T3FREE, THYROIDAB in the last 72 hours. ? ?Invalid input(s): FREET3 ? ?Cardiac Enzymes ?No results for input(s): CKMB, TROPONINI, MYOGLOBIN in the last 168 hours. ? ?Invalid input(s):  CK ?------------------------------------------------------------------------------------------------------------------ ?   ?Component Value Date/Time  ? BNP 184.0 (H) 09/08/2021 1314  ? ? ?CBG: ?No results for input(s): GLUCAP in the last 168 hours. ? ?Recent Results (from the past 240 hour(s))  ?Culture, blood (Routine x 2)     Status: None (Preliminary result)  ? Collection Time: 01/01/22  9:35 PM  ? Specimen: BLOOD RIGHT ARM  ?Result Value Ref Range Status  ? Specimen Description BLOOD RIGHT ARM  Final  ? Special Requests   Final  ?  BOTTLES DRAWN AEROBIC AND ANAEROBIC Blood Culture adequate volume  ? Culture   Final  ?  NO GROWTH 2 DAYS ?Performed at [facility], [place] [address] [place], [place] [address] ?  ? Report Status PENDING  Incomplete  ?Culture, blood (Routine x 2)     Status: None (Preliminary result)  ? Collection Time:  01/01/22 10:04 PM  ? Specimen: BLOOD LEFT HAND  ?Result Value Ref Range Status  ? Specimen Description BLOOD LEFT HAND  Final  ? Special Requests   Final  ?  BOTTLES DRAWN AEROBIC ONLY Blood Culture adequate volume  ? Culture   Final  ?  NO GROWTH 2 DAYS ?Performed at [facility], [place] [address] [place], [place] [address] ?  ? Report Status PENDING  Incomplete  ?  ? ?Radiology Studies: ?No results found. ? ? ?Marzetta Board, MD, PhD ?[facility] ? ?Between 7 am - 7 pm I am available, please contact me via Amion (for emergencies) or Securechat (non urgent messages) ? ?Between 7 pm - 7 am I am not available, please contact night coverage MD/APP via Amion ? ?

## 2022-01-03 NOTE — Progress Notes (Signed)
[name] ([id number]) ?Visit Report for 12/30/2021 ?Chief Complaint Document Details ?Patient Name: Date of Service: ?[name]. 12/30/2021 1:30 PM ?Medical Record Number: [id number] ?Patient Account Number: [id number] ?Date of Birth/Sex: Treating RN: ?June 01, 1951 (71 y.o. F) Deaton, Bobbi ?Primary Care Provider: Deland Pretty Other Clinician: ?Referring Provider: ?Treating Provider/Extender: Kalman Shan ?Deland Pretty ?Weeks in Treatment: 5 ?Information Obtained from: Patient ?Chief Complaint ?11/19/2021; Left lower extremity wound status post fall ?Electronic Signature(s) ?Signed: 12/31/2021 2:17:33 PM By: Kalman Shan DO ?Entered By: Kalman Shan on 12/31/2021 14:12:52 ?-------------------------------------------------------------------------------- ?HPI Details ?Patient Name: Date of Service: ?[name]. 12/30/2021 1:30 PM ?Medical Record Number: [id number] ?Patient Account Number: [id number] ?Date of Birth/Sex: Treating RN: ?Dec 28, 1950 (71 y.o. F) Deaton, Bobbi ?Primary Care Provider: Deland Pretty Other Clinician: ?Referring Provider: ?Treating Provider/Extender: Kalman Shan ?Deland Pretty ?Weeks in Treatment: 5 ?History of Present Illness ?HPI Description: Admission 11/19/2021 ?[name] is a 71 year old female with a past medical history of paroxysmal A-fib on Eliquis, hypothyroidism, major depressive disorder, venous ?insufficiency and chronic diastolic heart failure that presents to the clinic for a 1 month history of nonhealing wound to the left lower extremity. She visited the ?ED on 10/18/2021 after a mechanical fall. She developed a hematoma that subsequently opened. She was hospitalized for 7 days and discharged on 10/25/2021. ?She has been on several different antibiotics For the past month. She states that most recently she was on Levaquin and linezolid for the past week. She ?states she completes her antibiotic course tomorrow. She has been using  Dakin's wet-to-dry dressings to the wound bed. She denies signs of infection. ?4/7; patient presents for follow-up. She has been using Dakin's wet-to-dry dressings. She did end up going to the ED on 4/1 because she had excess bleeding ?with dressing change that she could not stop. She is on Eliquis for A-fib. In the ED they tied off a small artery. She has had no issues since discharge. She ?denies signs of infection. ?4/14; this is a very difficult clinical situation. A patient with underlying chronic venous insufficiency and lymphedema very significant lower extremity edema ?had a hematoma after a fall on her left upper lateral lower leg. She is on Eliquis for atrial fibrillation apparently with a history of a splenic infarct following with ?Dr. Jolyn Nap of cardiology. She has exhibited significant bleeding from the wound surface including ao Venous bleeder that required suturing short while ago. ?She has been using Dakin's wet-to-dry packing and over the surface of the wound. ?She saw Dr. Caryl Comes yesterday he is reluctant to consider stopping the Eliquis because of the prior history of presumed cardioembolism. Wants to communicate ?with Dr. Heber [place] when she returns. In a perfect world where she was not on Eliquis she requires a wound VAC with additional compression wraps but I ?understand the reluctance to do this because of the concerns of bleeding ?4/28; the patient's wound actually looks better today using Dakin's wet-to-dry that she is changing twice a day she is wrapping this with Kerlix and Ace ?wrapping. She tells me she had 2 small bleeding areas which were part of the superficial wound that stopped this week with direct pressure. Other than that no ?major issues. ?In follow-up from discussion of last week Dr. 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 ?The bigger question  from my point of view is the wound VAC issue. As far as she knows and her daughter-in-law verifies that she has not had any bleeding ?from the deeper parts of the wound although the bleeding has been superficial including the one that sent her to the ER for stitches. She is concerned that a ?wound VAC would cause further bleeding and I cannot completely allay those concerns. ?5/8; patient presents for follow-up. She has no issues or complaints today. She has been using Dakin's wet-to-dry dressings without issues. She denies signs ?of infection. ?5/12; patient presents for follow-up. She continues to use Dakin's wet-to-dry dressings without issues. She denies signs of infection. She reports some issues ?with bleeding at times but this has improved. She denies signs of infection. ?Electronic Signature(s) ?Signed: 12/31/2021 2:17:33 PM By: Kalman Shan DO ?Entered By: Kalman Shan on 12/31/2021 14:13:46 ?-------------------------------------------------------------------------------- ?Physical Exam Details ?Patient Name: Date of Service: ?[name]. 12/30/2021 1:30 PM ?Medical Record Number: [id number] ?Patient Account Number: [id number] ?Date of Birth/Sex: Treating RN: ?Nov 28, 1950 (71 y.o. F) Deaton, Bobbi ?Primary Care Provider: Deland Pretty Other Clinician: ?Referring Provider: ?Treating Provider/Extender: Kalman Shan ?Deland Pretty ?Weeks in Treatment: 5 ?Constitutional ?respirations regular, non-labored and within target range for patient.Marland Kitchen ?Cardiovascular ?2+ dorsalis pedis/posterior tibialis pulses. ?Psychiatric ?pleasant and cooperative. ?Notes ?Left lower extremity: T the lateral aspect there is an open wound with granulation tissue and scattered areas of subcutaneous necrotic tissue. No surrounding ?o ?signs of infection. ?Electronic Signature(s) ?Signed: 12/31/2021 2:17:33 PM By: Kalman Shan DO ?Entered By: Kalman Shan on 12/31/2021  14:14:54 ?-------------------------------------------------------------------------------- ?Physician Orders Details ?Patient Name: Date of Service: ?[name]. 12/30/2021 1:30 PM ?Medical Record Number: [id number] ?Patient Account Number: [id number] ?Date of Birth/Sex: Treating RN: ?1951-05-15 (71 y.o. F) Deaton, Bobbi ?Primary Care Provider: Deland Pretty Other Clinician: ?Referring Provider: ?Treating Provider/Extender: Kalman Shan ?Deland Pretty ?Weeks in Treatment: 5 ?Verbal / Phone Orders: No ?Diagnosis Coding ?Follow-up Appointments ?ppointment in 1 week. - Dr. Heber [place] and [facility] Room 7 Thursday 01/06/2022 330pm ?Return A ?Dr. Heber [place] and [facility], Room 8 Thursday 01/21/2022 215pm ?Bathing/ Shower/ Hygiene ?May shower with protection but do not get wound dressing(s) wet. ?Edema Control - Lymphedema / SCD / Other ?Elevate legs to the level of the heart or above for 30 minutes daily and/or when sitting, a frequency of: - 3-4 times a day throughout the day. ?Avoid standing for long periods of time. ?Home Health ?No change in wound care orders this week; continue Home Health for wound care. May utilize formulary equivalent dressing for wound ?treatment orders unless otherwise specified. - 2x a week dressing changes and all other days family member to change. ?Other Home Health Orders/Instructions: - [facility] ?Wound Treatment ?Wound #1 - Lower Leg Wound Laterality: Left, Lateral ?Cleanser: Wound Cleanser (Home Health) 2 x Per Day/30 Days ?Discharge Instructions: Cleanse the wound with wound cleanser prior to applying a clean dressing using gauze sponges, not tissue or cotton balls. ?Prim Dressing: Plain packing strip 1/2 (in) 2 x Per Day/30 Days ?ary ?Discharge Instructions: ****Or Conforming Stretch Gauze Bandage, Sterile 2x75 (in/in)**** Lightly pack as instructed. May use 1" or conform ?gauze ?Prim Dressing: Dakin's Solution 0.25%, 16 (oz) (Home Health) 2 x Per Day/30 Days ?ary ?Discharge Instructions:  Moisten gauze with Dakin's solution ?Secondary Dressing: ABD Pad, 8x10 (Home Health) 2 x Per Day/30 Days ?Discharge Instructions: Apply over primary dressing as directed. ?Secondary Dressing: Woven Gauze Sponge, Non-S

## 2022-01-04 DIAGNOSIS — L03116 Cellulitis of left lower limb: Secondary | ICD-10-CM | POA: Diagnosis not present

## 2022-01-04 LAB — CBC
HCT: 29.1 % — ABNORMAL LOW (ref 36.0–46.0)
Hemoglobin: 9 g/dL — ABNORMAL LOW (ref 12.0–15.0)
MCH: 27.5 pg (ref 26.0–34.0)
MCHC: 30.9 g/dL (ref 30.0–36.0)
MCV: 89 fL (ref 80.0–100.0)
Platelets: 270 10*3/uL (ref 150–400)
RBC: 3.27 MIL/uL — ABNORMAL LOW (ref 3.87–5.11)
RDW: 15.3 % (ref 11.5–15.5)
WBC: 9.5 10*3/uL (ref 4.0–10.5)
nRBC: 0 % (ref 0.0–0.2)

## 2022-01-04 LAB — BASIC METABOLIC PANEL
Anion gap: 8 (ref 5–15)
BUN: 36 mg/dL — ABNORMAL HIGH (ref 8–23)
CO2: 22 mmol/L (ref 22–32)
Calcium: 8.3 mg/dL — ABNORMAL LOW (ref 8.9–10.3)
Chloride: 107 mmol/L (ref 98–111)
Creatinine, Ser: 1.07 mg/dL — ABNORMAL HIGH (ref 0.44–1.00)
GFR, Estimated: 55 mL/min — ABNORMAL LOW (ref >60)
Glucose, Bld: 99 mg/dL (ref 70–99)
Potassium: 4.1 mmol/L (ref 3.5–5.1)
Sodium: 137 mmol/L (ref 135–145)

## 2022-01-04 NOTE — Progress Notes (Signed)
?PROGRESS NOTE ? ?[name] [id number] DOB: 19-Nov-1950 DOA: 01/01/2022 ?PCP: Deland Pretty, MD ? ? LOS: 2 days  ? ?Brief Narrative / Interim history: ?71 year old female with HTN, A-fib on Eliquis, hypothyroidism, chronic diastolic CHF, hypothyroidism, morbid obesity, who comes into the hospital with a left lower extremity erythema, fever and chills.  She also has a chronic left leg wound since February after a fall and hematoma.  She was admitted with severe left lower extremity cellulitis and placed on antibiotics. ? ?Subjective / 24h Interval events: ?Doing well this morning.  Appreciates improvement.  Less pain at the cellulitic area and less itching. ? ?Assesement and Plan: ?Principal Problem: ?  Left leg cellulitis ?Active Problems: ?  Essential hypertension ?  Hypothyroidism ?  Chronic diastolic heart failure ([place]) ?  Paroxysmal atrial fibrillation (H[facility]) ?  Gastroesophageal reflux disease ?  Morbid obesity with BMI of 50.0-59.9, adult ([place]) ?  Non-pressure chronic ulcer of other part of left lower leg with fat layer exposed ([place]) ?  Hyponatremia ? ? ?Principal problem ?Left lower extremity cellulitis-patient was started on broad-spectrum antibiotics, continue.  An MRI done fortunately did not show any abscesses but it did show severe cellulitis involving the lower half of the left lower extremity.  Antibiotics chosen per cellulitic pathway/severe cellulitis.  Cellulitic area seen this morning, seems to be improving, less red and not as painful.  Continue IV antibiotics perhaps for another 1 to 2 days.  White count has now normalized ? ?Active problems ?Hyponatremia-sodium normalized with IV fluids ? ?Nonpressure chronic ulcer-wound care consult.  MRI did not show any significant abscess or concern for osteomyelitis ?  ?Morbid obesity-BMI 53.  She would benefit from significant weight loss.  She recently lost 25 pounds and is currently working to lose more weight ?  ?Paroxysmal A-fib-continue home  regimen with Eliquis, metoprolol ?  ?Chronic diastolic CHF-continue home regimen with furosemide, losartan, metoprolol.  Clinically appears euvolemic this morning ?  ?Hypothyroidism-continue Synthroid ?  ?Hypertension-continue home regimen.  Blood pressure stable today ? ?Scheduled Meds: ? apixaban  5 mg Oral BID  ? vitamin C  500 mg Oral Daily  ? escitalopram  5 mg Oral Daily  ? flecainide  100 mg Oral BID  ? furosemide  40 mg Oral q morning  ? levothyroxine  75 mcg Oral QAC breakfast  ? losartan  50 mg Oral Daily  ? metoprolol tartrate  50 mg Oral BID  ? sodium hypochlorite   Irrigation BID  ? ?Continuous Infusions: ? ceFEPime (MAXIPIME) IV 2 g (01/04/22 0606)  ? vancomycin 1,250 mg (01/04/22 0433)  ? ?PRN Meds:.acetaminophen **OR** acetaminophen, ondansetron **OR** ondansetron (ZOFRAN) IV ? ?Diet Orders (From admission, onward)  ? ?  Start     Ordered  ? 01/02/22 0353  Diet Heart Room service appropriate? Yes; Fluid consistency: Thin  Diet effective now       ?Question Answer Comment  ?Room service appropriate? Yes   ?Fluid consistency: Thin   ?  ? 01/02/22 0352  ? ?  ?  ? ?  ? ? ?DVT prophylaxis:  ?apixaban (ELIQUIS) tablet 5 mg  ? ?Lab Results  ?Component Value Date  ? PLT 270 01/04/2022  ? ? ?  Code Status: Full Code ? ?Family Communication: No family at bedside ? ?Status is: Inpatient ? ?Remains inpatient appropriate because: needs IV antibiotics  ? ?Level of care: Med-Surg ? ?Consultants:  ?none ? ?Procedures:  ?none ? ?Microbiology  ?Blood cultures 5/13-no growth yet ? ?  Antimicrobials: ?Vancomycin/cefepime 5/14 >> ? ? ?Objective: ?Vitals:  ? 01/03/22 0409 01/03/22 0843 01/03/22 1928 01/04/22 0436  ?BP: (!) 129/50 (!) 142/66 133/72 (!) 119/59  ?Pulse: 63 69 62 65  ?Resp: 20 18 18 18   ?Temp: 98.4 ?F (36.9 ?C) 98.3 ?F (36.8 ?C) 98 ?F (36.7 ?C)   ?TempSrc: Oral Oral Oral   ?SpO2: 95% 97% 100% 95%  ?Weight:      ?Height:      ? ? ?Intake/Output Summary (Last 24 hours) at 01/04/2022 1046 ?Last data filed at  01/04/2022 [id number] ?Gross per 24 hour  ?Intake 550 ml  ?Output --  ?Net 550 ml  ? ? ?Wt Readings from Last 3 Encounters:  ?01/01/22 (!) 154.2 kg  ?11/20/21 (!) 154.2 kg  ?10/24/21 (!) 165 kg  ? ? ?Examination: ? ?Constitutional: NAD ?Eyes: lids and conjunctivae normal, no scleral icterus ?ENMT: mmm ?Neck: normal, supple ?Respiratory: clear to auscultation bilaterally, no wheezing, no crackles. Normal respiratory effort.  ?Cardiovascular: Regular rate and rhythm, no murmurs / rubs / gallops. No LE edema. ?Abdomen: soft, no distention, no tenderness. Bowel sounds positive.  ?Skin: Left lateral LE wound, left medial LE cellulitic area with improvement ?Neurologic: Nonfocal ? ?Data Reviewed: I have independently reviewed following labs and imaging studies  ? ?CBC ?Recent Labs  ?Lab 01/01/22 ?2202 01/03/22 ?[id number] 01/04/22 ?0158  ?WBC 15.3* 9.6 9.5  ?HGB 10.5* 9.2* 9.0*  ?HCT 32.9* 29.4* 29.1*  ?PLT 298 264 270  ?MCV 87.5 88.3 89.0  ?MCH 27.9 27.6 27.5  ?MCHC 31.9 31.3 30.9  ?RDW 15.1 15.3 15.3  ?LYMPHSABS 1.0 1.4  --   ?MONOABS 0.8 0.9  --   ?EOSABS 0.2 0.2  --   ?BASOSABS 0.1 0.1  --   ? ? ? ?Recent Labs  ?Lab 01/01/22 ?2202 01/01/22 ?2203 01/03/22 ?[id number] 01/04/22 ?0158  ?NA 130*  --  136 137  ?K 5.0  --  4.3 4.1  ?CL 97*  --  105 107  ?CO2 23  --  24 22  ?GLUCOSE 106*  --  107* 99  ?BUN 44*  --  34* 36*  ?CREATININE 1.10*  --  1.09* 1.07*  ?CALCIUM 8.9  --  8.5* 8.3*  ?AST 21  --  18  --   ?ALT 16  --  15  --   ?ALKPHOS 72  --  52  --   ?BILITOT 0.8  --  0.7  --   ?ALBUMIN 3.1*  --  2.4*  --   ?MG  --   --  2.1  --   ?LATICACIDVEN  --  1.0  --   --   ?INR 1.8*  --   --   --   ? ? ? ?------------------------------------------------------------------------------------------------------------------ ?No results for input(s): CHOL, HDL, LDLCALC, TRIG, CHOLHDL, LDLDIRECT in the last 72 hours. ? ?No results found for:  HGBA1C ?------------------------------------------------------------------------------------------------------------------ ?No results for input(s): TSH, T4TOTAL, T3FREE, THYROIDAB in the last 72 hours. ? ?Invalid input(s): FREET3 ? ?Cardiac Enzymes ?No results for input(s): CKMB, TROPONINI, MYOGLOBIN in the last 168 hours. ? ?Invalid input(s): CK ?------------------------------------------------------------------------------------------------------------------ ?   ?Component Value Date/Time  ? BNP 184.0 (H) 09/08/2021 1314  ? ? ?CBG: ?No results for input(s): GLUCAP in the last 168 hours. ? ?Recent Results (from the past 240 hour(s))  ?Culture, blood (Routine x 2)     Status: None (Preliminary result)  ? Collection Time: 01/01/22  9:35 PM  ? Specimen: BLOOD RIGHT ARM  ?Result Value Ref Range Status  ?  Specimen Description BLOOD RIGHT ARM  Final  ? Special Requests   Final  ?  BOTTLES DRAWN AEROBIC AND ANAEROBIC Blood Culture adequate volume  ? Culture   Final  ?  NO GROWTH 3 DAYS ?Performed at [facility], [place] [address], [place], [place] [address] ?  ? Report Status PENDING  Incomplete  ?Culture, blood (Routine x 2)     Status: None (Preliminary result)  ? Collection Time: 01/01/22 10:04 PM  ? Specimen: BLOOD LEFT HAND  ?Result Value Ref Range Status  ? Specimen Description BLOOD LEFT HAND  Final  ? Special Requests   Final  ?  BOTTLES DRAWN AEROBIC ONLY Blood Culture adequate volume  ? Culture   Final  ?  NO GROWTH 3 DAYS ?Performed at [facility], [place] [address], [place], [place] [address] ?  ? Report Status PENDING  Incomplete  ?  ? ?Radiology Studies: ?No results found. ? ? ?Marzetta Board, MD, PhD ?[facility] ? ?Between 7 am - 7 pm I am available, please contact me via Amion (for emergencies) or Securechat (non urgent messages) ? ?Between 7 pm - 7 am I am not available, please contact night coverage MD/APP via Amion ? ?

## 2022-01-04 NOTE — Progress Notes (Signed)
Physical Therapy Treatment ?Patient Details ?Name: [name] ?MRN: [id number] ?DOB: [birth year] ?Today's Date: 01/04/2022 ? ? ?History of Present Illness Pt is a 71 y.o. F who presents 01/01/2022 with increasing erythema of left thigh and left ankle, fever, and chills. Significant PMH: HTN, afib on Eliquis, chronic diastolic heart failure, chronic LLE wound. ? ?  ?PT Comments  ? ? Patient agrees to PT session. Reports she is tired. Woke up at 1am and was unable to go back to sleep. She is mod independent with sit to stand from recliner. Ambulated 200 feet with RW and supervision. Min A needed to bring LEs up onto bed. She will continue to benefit from skilled PT while here to improve strength and activity tolerance for safe return home.  ?    ?Recommendations for follow up therapy are one component of a multi-disciplinary discharge planning process, led by the attending physician.  Recommendations may be updated based on patient status, additional functional criteria and insurance authorization. ? ?Follow Up Recommendations ? Home health PT ?  ?  ?Assistance Recommended at Discharge PRN  ?Patient can return home with the following A little help with walking and/or transfers;A little help with bathing/dressing/bathroom;Assistance with cooking/housework;Assist for transportation;Help with stairs or ramp for entrance ?  ?Equipment Recommendations ? None recommended by PT  ?  ?Recommendations for Other Services   ? ? ?  ?Precautions / Restrictions Precautions ?Precautions: Fall ?Restrictions ?Weight Bearing Restrictions: No  ?  ? ?Mobility ? Bed Mobility ?Overal bed mobility: Needs Assistance ?Bed Mobility: Sit to Supine ?  ?  ?  ?Sit to supine: Min assist ?  ?General bed mobility comments: Needs assist to bring LEs onto bed. ?  ? ?Transfers ?Overall transfer level: Modified independent ?Equipment used: Rolling walker (2 wheels) ?Transfers: Sit to/from Stand ?Sit to Stand: Modified independent (Device/Increase  time) ?  ?  ?  ?  ?  ?General transfer comment: able to stand from recliner with momentum ?  ? ?Ambulation/Gait ?Ambulation/Gait assistance: Supervision ?Gait Distance (Feet): 200 Feet ?Assistive device: Rolling walker (2 wheels) ?Gait Pattern/deviations: Step-through pattern, Decreased stride length, Decreased dorsiflexion - right, Decreased dorsiflexion - left, Shuffle ?Gait velocity: decreased ?  ?  ?General Gait Details: Decreased bilateral foot clearance with fatigue, increased trunk flexion, supervision for safety 2 brief standing rests needed due to fatigue. ? ? ?Stairs ?  ?  ?  ?  ?  ? ? ?Wheelchair Mobility ?  ? ?Modified Rankin (Stroke Patients Only) ?  ? ? ?  ?Balance Overall balance assessment: Modified Independent ?  ?  ?  ?  ?  ?  ?  ?  ?  ?  ?  ?  ?  ?  ?  ?  ?  ?  ?  ? ?  ?Cognition Arousal/Alertness: Awake/alert ?Behavior During Therapy: [facility] for tasks assessed/performed ?Overall Cognitive Status: Within Functional Limits for tasks assessed ?  ?  ?  ?  ?  ?  ?  ?  ?  ?  ?  ?  ?  ?  ?  ?  ?  ?  ?  ? ?  ?Exercises   ? ?  ?General Comments   ?  ?  ? ?Pertinent Vitals/Pain Pain Assessment ?Pain Assessment: No/denies pain  ? ? ?Home Living   ?  ?  ?  ?  ?  ?  ?  ?  ?  ?   ?  ?Prior Function    ?  ?  ?   ? ?  PT Goals (current goals can now be found in the care plan section) Acute Rehab PT Goals ?Patient Stated Goal: get outside and garden ?PT Goal Formulation: With patient ?Time For Goal Achievement: 01/17/22 ?Potential to Achieve Goals: Good ?Progress towards PT goals: Progressing toward goals ? ?  ?Frequency ? ? ? Min 3X/week ? ? ? ?  ?PT Plan Current plan remains appropriate  ? ? ?Co-evaluation   ?  ?  ?  ?  ? ?  ?AM-PAC PT "6 Clicks" Mobility   ?Outcome Measure ? Help needed turning from your back to your side while in a flat bed without using bedrails?: None ?Help needed moving from lying on your back to sitting on the side of a flat bed without using bedrails?: A Little ?Help needed moving to and from  a bed to a chair (including a wheelchair)?: A Little ?Help needed standing up from a chair using your arms (e.g., wheelchair or bedside chair)?: None ?Help needed to walk in hospital room?: A Little ?Help needed climbing 3-5 steps with a railing? : A Lot ?6 Click Score: 19 ? ?  ?End of Session   ?Activity Tolerance: Patient tolerated treatment well ?Patient left: in bed;with call bell/phone within reach ?Nurse Communication: Mobility status ?PT Visit Diagnosis: Unsteadiness on feet (R26.81);Muscle weakness (generalized) (M62.81);Difficulty in walking, not elsewhere classified (R26.2) ?  ? ? ?Time: [id number] ?PT Time Calculation (min) (ACUTE ONLY): 12 min ? ?Charges:  $Gait Training: 8-22 mins          ?          ? ?Amanda Cockayne, PT, GCS ?01/04/22,1:57 PM ? ?

## 2022-01-04 NOTE — Progress Notes (Signed)
Mobility Specialist Progress Note: ? ? 01/04/22 1012  ?Mobility  ?Activity Ambulated with assistance in room;Ambulated with assistance to bathroom  ?Level of Assistance Minimal assist, patient does 75% or more  ?Assistive Device Front wheel walker  ?Distance Ambulated (ft) 30 ft  ?Activity Response Tolerated well  ?$Mobility charge 1 Mobility  ? ?Pt received in bathroom needing to get back to bed. MinA to stand from toilet. Left in chair with call bell in reach and all needs met.  ? ?Viki Carrera ?Mobility Specialist ?Primary Phone [phone] ? ?

## 2022-01-05 DIAGNOSIS — K219 Gastro-esophageal reflux disease without esophagitis: Secondary | ICD-10-CM | POA: Diagnosis not present

## 2022-01-05 DIAGNOSIS — I10 Essential (primary) hypertension: Secondary | ICD-10-CM | POA: Diagnosis not present

## 2022-01-05 DIAGNOSIS — L03116 Cellulitis of left lower limb: Secondary | ICD-10-CM | POA: Diagnosis not present

## 2022-01-05 DIAGNOSIS — I5032 Chronic diastolic (congestive) heart failure: Secondary | ICD-10-CM | POA: Diagnosis not present

## 2022-01-05 LAB — CBC
HCT: 27.8 % — ABNORMAL LOW (ref 36.0–46.0)
Hemoglobin: 8.5 g/dL — ABNORMAL LOW (ref 12.0–15.0)
MCH: 27.2 pg (ref 26.0–34.0)
MCHC: 30.6 g/dL (ref 30.0–36.0)
MCV: 89.1 fL (ref 80.0–100.0)
Platelets: 289 10*3/uL (ref 150–400)
RBC: 3.12 MIL/uL — ABNORMAL LOW (ref 3.87–5.11)
RDW: 15.5 % (ref 11.5–15.5)
WBC: 9.7 10*3/uL (ref 4.0–10.5)
nRBC: 0 % (ref 0.0–0.2)

## 2022-01-05 LAB — VANCOMYCIN, TROUGH: Vancomycin Tr: 37 ug/mL (ref 15–20)

## 2022-01-05 MED ORDER — SULFAMETHOXAZOLE-TRIMETHOPRIM 800-160 MG PO TABS
1.0000 | ORAL_TABLET | Freq: Two times a day (BID) | ORAL | Status: DC
  Administered 2022-01-06 – 2022-01-07 (×2): 1 via ORAL
  Filled 2022-01-05 (×2): qty 1

## 2022-01-05 NOTE — Progress Notes (Signed)
Critical Result ? ?Vanc trough 37, notified Dr.Akula and pharmacy. Per pharmacy, 3pm vanc dose was held.  ?

## 2022-01-05 NOTE — Progress Notes (Signed)
Pharmacy Antibiotic Note ? ?[name] is a 71 y.o. female admitted on 01/01/2022 with  wound infection .  Pharmacy has been consulted for Cefepime and vancomycin dosing. ? ?WBC elevated, SCr mildly elevated  ? ?Vanc trough came back at 37 this PM. It was drawn correctly. Plan to go to PO abx tomorrow. D/w Dr. Blake Divine and we will dc vanc/cefepime and transition to PO septra tomorrow PM to complete 14 days of total abx.  ? ?Plan: ?Dc vanc/cefepime ?Septra DS 1 PO BID until 5/27 ? ?Height: 5\' 7"  (170.2 cm) ?Weight: (!) 154.2 kg (339 lb 15.2 oz) ?IBW/kg (Calculated) : 61.6 ? ?Temp (24hrs), Avg:98.2 ?F (36.8 ?C), Min:97.8 ?F (36.6 ?C), Max:98.5 ?F (36.9 ?C) ? ?Recent Labs  ?Lab 01/01/22 ?2202 01/01/22 ?2203 01/03/22 ?01/05/22 01/04/22 ?0158 01/05/22 ?0256 01/05/22 ?1440  ?WBC 15.3*  --  9.6 9.5 9.7  --   ?CREATININE 1.10*  --  1.09* 1.07*  --   --   ?LATICACIDVEN  --  1.0  --   --   --   --   ?VANCOTROUGH  --   --   --   --   --  37*  ? ?  ?Estimated Creatinine Clearance: 72.9 mL/min (A) (by C-G formula based on SCr of 1.07 mg/dL (H)).   ? ?Allergies  ?Allergen Reactions  ? Augmentin [Amoxicillin-Pot Clavulanate] Hives and Itching  ?  Has patient had a PCN reaction causing immediate rash, facial/tongue/throat swelling, SOB or lightheadedness with hypotension:No ?Has patient had a PCN reaction causing severe rash involving mucus membranes or skin necrosis:No ?Has patient had a PCN reaction that required hospitalization:No ?Has patient had a PCN reaction occurring within the last 10 years:No ?If all of the above answers are "NO", then may proceed with Cephalosporin use. ?  ? Pneumococcal Vaccines Other (See Comments)  ?  Caused fever, and swelling at injection site  ? ? ?Antimicrobials this admission: ?Cefepime 5/14 >> 5/18 ?Vancomycin  5/14 >> 5/17 ?Septra 5/18>>5/27 ?Dose adjustments this admission: ? ? ?Microbiology results: ?5/13 BCx: ngtd ? ? ?6/13, PharmD, BCIDP, AAHIVP, CPP ?Infectious Disease  Pharmacist ?01/05/2022 4:43 PM ? ? ? ? ?

## 2022-01-05 NOTE — Progress Notes (Signed)
Mobility Specialist Progress Note: ? ? 01/05/22 1457  ?Mobility  ?Activity Ambulated with assistance in hallway  ?Level of Assistance Standby assist, set-up cues, supervision of patient - no hands on  ?Assistive Device Front wheel walker  ?Distance Ambulated (ft) 160 ft  ?Activity Response Tolerated well  ?$Mobility charge 1 Mobility  ? ?Pt received in chair willing to participate in mobility. No complaints of pain, but said she felt SOB towards end of ambulation. Left in chair with call bell in reach and all needs met.  ? ?[name] ?Mobility Specialist ?Primary Phone [phone] ? ?

## 2022-01-05 NOTE — Progress Notes (Incomplete)
Pharmacy Antibiotic Note ? ?[name] is a 71 y.o. female admitted on 01/01/2022 with severe LLE cellulitis.  Pharmacy has been consulted for Cefepime and vancomycin dosing. ? ?Cellulitis is improving. Planning 1-2 more days of IV antibiotics. Vancomycin trough level is **** ? ?Plan: ?-Cefepime 2 gm IV Q 8 hours ?-Vancomycin 1250 mg IV Q 12 hrs. Expected AUC: 513  ?-Monitor CBC, renal fx, cultures and clinical progress ?-Vanc levels as indicated  ? ? ?Height: 5\' 7"  (170.2 cm) ?Weight: (!) 154.2 kg (339 lb 15.2 oz) ?IBW/kg (Calculated) : 61.6 ? ?Temp (24hrs), Avg:98.2 ?F (36.8 ?C), Min:97.8 ?F (36.6 ?C), Max:98.5 ?F (36.9 ?C) ? ?Recent Labs  ?Lab 01/01/22 ?2202 01/01/22 ?2203 01/03/22 ?[id number] 01/04/22 ?0158 01/05/22 ?0256  ?WBC 15.3*  --  9.6 9.5 9.7  ?CREATININE 1.10*  --  1.09* 1.07*  --   ?LATICACIDVEN  --  1.0  --   --   --   ? ?  ?Estimated Creatinine Clearance: 72.9 mL/min (A) (by C-G formula based on SCr of 1.07 mg/dL (H)).   ? ?Allergies  ?Allergen Reactions  ? Augmentin [Amoxicillin-Pot Clavulanate] Hives and Itching  ?  Has patient had a PCN reaction causing immediate rash, facial/tongue/throat swelling, SOB or lightheadedness with hypotension:No ?Has patient had a PCN reaction causing severe rash involving mucus membranes or skin necrosis:No ?Has patient had a PCN reaction that required hospitalization:No ?Has patient had a PCN reaction occurring within the last 10 years:No ?If all of the above answers are "NO", then may proceed with Cephalosporin use. ?  ? Pneumococcal Vaccines Other (See Comments)  ?  Caused fever, and swelling at injection site  ? ? ?Antimicrobials this admission: ?Cefepime 5/14 >>  ?Vancomycin  5/14 >>  ? ?Dose adjustments this admission: ?5/17 VT ***  ? ?Microbiology results: ?5/13 BCx: ngtd ? ? ?Thank you for allowing pharmacy to be a part of this patientChristines care. ? ?Benetta Spar, PharmD, BCPS, BCCP ?Clinical Pharmacist ? ?Please check AMION for all [place] phone  numbers ?After 10:00 PM, call [place] [phone] ? ?

## 2022-01-05 NOTE — Progress Notes (Signed)
? ? ? [name] ?                                                                            ? ? ?Tierica Wolske, is a 71 y.o. female, DOB - [birth year], [id number] ?Admit date - 01/01/2022    ?Outpatient Primary MD for the patient is Deland Pretty, MD ? ?LOS - 3  days ? ? ? ?Brief summary  ? ? ?71 year old female with HTN, A-fib on Eliquis, hypothyroidism, chronic diastolic CHF, hypothyroidism, morbid obesity, who comes into the hospital with a left lower extremity erythema, fever and chills.  She also has a chronic left leg wound since February after a fall and hematoma.  She was admitted with severe left lower extremity cellulitis and placed on antibiotics. ? ?Assessment & Plan  ? ? ?Assessment and Plan: ?* Left leg cellulitis ?Improving. The erythema and tenderness is improving.  ?Wbc normalized.  ?Afebrile.  ?Continue with IV antibiotics for another 24 hours and transition to oral antibiotics to complete a 2 week course.  ? ? ? ?Hyponatremia ?Resolved.  ? ?Non-pressure chronic ulcer of other part of left lower leg with fat layer exposed ([place]) ?Pt follows up with wound care.  ? ? ? ? ? ? ? ?Morbid obesity with BMI of 50.0-59.9, adult ([place]) ?Chronic. BMI 53 ? ?Gastroesophageal reflux disease ?Stable.  ? ?Paroxysmal atrial fibrillation ([place]) ?Stable. Continue eliquis 5 mg bid for anticoagulation,  rate controlled with flecainide 100 mg bid, lopressor 50 mg bid. Keep serum k >4.0 and Mg >2.0 ? ?Chronic diastolic heart failure ([place]) ?Stable. Euvolemic. Continue lasix 40 mg daily, losartan 50 mg daily. ? ?Hypothyroidism ?Stable. continue synthroid 75 mcg daily ? ?Essential hypertension ?Well controlled.  ?Continue with lasix, losartan and lopressor.  ? ? ? ? ?  ? ? ? ?Nutrition Interventions: ? ?  ? ?Estimated body mass index is 53.24 kg/m? as calculated from the following: ?  Height as of this encounter: 5\' 7"  (1.702 m). ?  Weight as of this encounter: 154.2 kg. ? ?Code Status: full code.  ?DVT  Prophylaxis:   ?apixaban (ELIQUIS) tablet 5 mg  ? ?Level of Care: Level of care: Med-Surg ?Family Communication: none at bedside.  ? ?Disposition Plan:     Remains inpatient appropriate:  IV vancomycin.  ? ?Procedures:  ?None.  ? ?Consultants:   ?None.  ? ?Antimicrobials:  ? ?Anti-infectives (From admission, onward)  ? ? Start     Dose/Rate Route Frequency Ordered Stop  ? 01/02/22 1500  vancomycin (VANCOREADY) IVPB 1250 mg/250 mL       ? 1,250 mg ?166.7 mL/hr over 90 Minutes Intravenous Every 12 hours 01/02/22 1339    ? 01/02/22 1400  ceFEPIme (MAXIPIME) 2 g in sodium chloride 0.9 % 100 mL IVPB       ? 2 g ?200 mL/hr over 30 Minutes Intravenous Every 8 hours 01/02/22 1339    ? 01/02/22 0230  vancomycin (VANCOREADY) IVPB 2000 mg/400 mL       ? 2,000 mg ?200 mL/hr over 120 Minutes Intravenous  Once 01/02/22 0229 01/02/22 0550  ? 01/02/22 0230  ceFEPIme (MAXIPIME) 2 g in sodium chloride 0.9 % 100 mL IVPB       ?  2 g ?200 mL/hr over 30 Minutes Intravenous  Once 01/02/22 0229 01/02/22 0346  ? ?  ? ? ? ?Medications ? ?Scheduled Meds: ? apixaban  5 mg Oral BID  ? vitamin C  500 mg Oral Daily  ? escitalopram  5 mg Oral Daily  ? flecainide  100 mg Oral BID  ? furosemide  40 mg Oral q morning  ? levothyroxine  75 mcg Oral QAC breakfast  ? losartan  50 mg Oral Daily  ? metoprolol tartrate  50 mg Oral BID  ? sodium hypochlorite   Irrigation BID  ? ?Continuous Infusions: ? ceFEPime (MAXIPIME) IV 2 g (01/05/22 0514)  ? vancomycin 1,250 mg (01/05/22 0259)  ? ?PRN Meds:.acetaminophen **OR** acetaminophen, ondansetron **OR** ondansetron (ZOFRAN) IV ? ? ? ?Subjective:  ? ?Edythe Denzler was seen and examined today.  Redness improving.  ?No chest pain or sob. Cellulitis is improving.   ? ?Objective:  ? ?Vitals:  ? 01/04/22 1940 01/04/22 2352 01/05/22 0512 01/05/22 0818  ?BP: (!) 103/91 114/61 (!) 141/72 (!) 122/53  ?Pulse: 69 60 63 62  ?Resp: 18 18 18 18   ?Temp: 98.5 ?F (36.9 ?C) 98.4 ?F (36.9 ?C) 98.1 ?F (36.7 ?C) 97.8 ?F (36.6 ?C)   ?TempSrc: Oral Oral Oral Oral  ?SpO2: 100% 96% 97% 100%  ?Weight:      ?Height:      ? ? ?Intake/Output Summary (Last 24 hours) at 01/05/2022 1334 ?Last data filed at 01/05/2022 1126 ?Gross per 24 hour  ?Intake 1290 ml  ?Output 1050 ml  ?Net 240 ml  ? ?Filed Weights  ? 01/01/22 2127  ?Weight: (!) 154.2 kg  ? ? ? ?Exam ?General exam: Appears calm and comfortable  ?Respiratory system: Clear to auscultation. Respiratory effort normal. ?Cardiovascular system: S1 & S2 heard, RRR. No JVD,  No pedal edema. ?Gastrointestinal system: Abdomen is nondistended, soft and nontender. Normal bowel sounds heard. ?Central nervous system: Alert and oriented. No focal neurological deficits. ?Extremities: left lower extremity cellulitis improved.  ?Skin: No rashes, lesions or ulcers ?Psychiatry:  Mood & affect appropriate.  ? ? ?Data Reviewed:  I have personally reviewed following labs and imaging studies ? ? ?CBC ?Lab Results  ?Component Value Date  ? WBC 9.7 01/05/2022  ? RBC 3.12 (L) 01/05/2022  ? HGB 8.5 (L) 01/05/2022  ? HCT 27.8 (L) 01/05/2022  ? MCV 89.1 01/05/2022  ? MCH 27.2 01/05/2022  ? PLT 289 01/05/2022  ? MCHC 30.6 01/05/2022  ? RDW 15.5 01/05/2022  ? LYMPHSABS 1.4 01/03/2022  ? MONOABS 0.9 01/03/2022  ? EOSABS 0.2 01/03/2022  ? BASOSABS 0.1 01/03/2022  ? ? ? ?Last metabolic panel ?Lab Results  ?Component Value Date  ? NA 137 01/04/2022  ? K 4.1 01/04/2022  ? CL 107 01/04/2022  ? CO2 22 01/04/2022  ? BUN 36 (H) 01/04/2022  ? CREATININE 1.07 (H) 01/04/2022  ? GLUCOSE 99 01/04/2022  ? GFRNONAA 55 (L) 01/04/2022  ? GFRAA >60 12/07/2017  ? CALCIUM 8.3 (L) 01/04/2022  ? PHOS 3.1 09/10/2021  ? PROT 6.2 (L) 01/03/2022  ? ALBUMIN 2.4 (L) 01/03/2022  ? BILITOT 0.7 01/03/2022  ? ALKPHOS 52 01/03/2022  ? AST 18 01/03/2022  ? ALT 15 01/03/2022  ? ANIONGAP 8 01/04/2022  ? ? ?CBG (last 3)  ?No results for input(s): GLUCAP in the last 72 hours.  ? ? ?Coagulation Profile: ?Recent Labs  ?Lab 01/01/22 ?2202  ?INR 1.8*  ? ? ? ?Radiology  Studies: ?No results found. ? ? ? ? ?  Hosie Poisson M.D. ?[name] ?01/05/2022, 1:34 PM ? ?Available via Epic secure chat 7am-7pm ?After 7 pm, please refer to night coverage provider listed on amion. ? ? ? ?

## 2022-01-05 NOTE — Care Management Important Message (Signed)
Important Message ? ?Patient Details  ?Name: [name] ?MRN: [id number] ?Date of Birth: 10-07-1950 ? ? ?Medicare Important Message Given:  Yes ? ? ? ? ?Tomas Schamp ?01/05/2022, 1:15 PM ?

## 2022-01-06 ENCOUNTER — Encounter (HOSPITAL_BASED_OUTPATIENT_CLINIC_OR_DEPARTMENT_OTHER): Payer: PPO | Admitting: Internal Medicine

## 2022-01-06 DIAGNOSIS — K219 Gastro-esophageal reflux disease without esophagitis: Secondary | ICD-10-CM | POA: Diagnosis not present

## 2022-01-06 DIAGNOSIS — I10 Essential (primary) hypertension: Secondary | ICD-10-CM | POA: Diagnosis not present

## 2022-01-06 DIAGNOSIS — L03116 Cellulitis of left lower limb: Secondary | ICD-10-CM | POA: Diagnosis not present

## 2022-01-06 DIAGNOSIS — I5032 Chronic diastolic (congestive) heart failure: Secondary | ICD-10-CM | POA: Diagnosis not present

## 2022-01-06 LAB — CULTURE, BLOOD (ROUTINE X 2)
Culture: NO GROWTH
Culture: NO GROWTH
Special Requests: ADEQUATE
Special Requests: ADEQUATE

## 2022-01-06 MED ORDER — FUROSEMIDE 10 MG/ML IJ SOLN
20.0000 mg | Freq: Once | INTRAMUSCULAR | Status: AC
  Administered 2022-01-06: 20 mg via INTRAVENOUS
  Filled 2022-01-06: qty 2

## 2022-01-06 NOTE — Progress Notes (Signed)
Mobility Specialist Progress Note:   01/06/22 1627  Mobility  Activity Ambulated with assistance in room;Ambulated with assistance to bathroom  Level of Assistance Standby assist, set-up cues, supervision of patient - no hands on  Assistive Device Front wheel walker  Distance Ambulated (ft) 20 ft  Activity Response Tolerated well  $Mobility charge 1 Mobility   Pt received in chair asking to go to bathroom. No complaints of pain. Left in chair with cal bell in reach and all needs met.   [facility] Public librarian Phone [phone]

## 2022-01-06 NOTE — Progress Notes (Signed)
Mobility Specialist Progress Note:   01/06/22 1146  Mobility  Activity Ambulated with assistance in hallway  Level of Assistance Standby assist, set-up cues, supervision of patient - no hands on  Assistive Device Front wheel walker  Distance Ambulated (ft) 160 ft  Activity Response Tolerated well  $Mobility charge 1 Mobility   Pt received in bed willing to participate in mobility. No complaints of pain. Required a couple short standing rest breaks. Left in chair with call bell in reach and all needs met.   [facility] Public librarian Phone [phone]

## 2022-01-06 NOTE — Progress Notes (Signed)
[facility]                                                                               [name], is a 71 y.o. female, DOB - 11-27-1950, [id number] Admit date - 01/01/2022    Outpatient Primary MD for the patient is Deland Pretty, MD  LOS - 4  days    Brief summary    71 year old female with HTN, A-fib on Eliquis, hypothyroidism, chronic diastolic CHF, hypothyroidism, morbid obesity, who comes into the hospital with a left lower extremity erythema, fever and chills.  She also has a chronic left leg wound since February after a fall and hematoma.  She was admitted with severe left lower extremity cellulitis and placed on antibiotics.  Assessment & Plan    Assessment and Plan: * Left leg cellulitis Improving. The erythema and tenderness is improving. She still has significant pedal edema.  Wbc normalized.  Afebrile.  One dose of IV lasix given today.  Transitioned to oral antibiotics.     Hyponatremia Resolved.   Non-pressure chronic ulcer of other part of left lower leg with fat layer exposed ([place]) Pt follows up with wound care. Pt reports her wound is much better than baseline.   Morbid obesity with BMI of 50.0-59.9, adult ([facility]) Chronic. BMI 53  Gastroesophageal reflux disease Stable.   Paroxysmal atrial fibrillation ([facility]) Stable. Continue eliquis 5 mg bid for anticoagulation,  rate controlled with flecainide 100 mg bid, lopressor 50 mg bid. Keep serum k >4.0 and Mg [id number]  Chronic diastolic heart failure ([facility]) Stable. Euvolemic. Continue lasix 40 mg daily, losartan 50 mg daily.  Hypothyroidism Stable. continue synthroid 75 mcg daily  Essential hypertension Well controlled BP parameters.  Continue with lasix, losartan and lopressor.       Estimated body mass index is 53.24 kg/m as calculated from the following:   Height as of this encounter: 5\' 7"  (1.702 m).   Weight as of this encounter: 154.2 kg.  Code Status: full code.  DVT  Prophylaxis:   apixaban (ELIQUIS) tablet 5 mg   Level of Care: Level of care: Med-Surg Family Communication: none at bedside.   Disposition Plan:     Remains inpatient appropriate:  IV vancomycin.   Procedures:  None.   Consultants:   None.   Antimicrobials:   Anti-infectives (From admission, onward)    Start     Dose/Rate Route Frequency Ordered Stop   01/06/22 2000  sulfamethoxazole-trimethoprim (BACTRIM DS) 800-160 MG per tablet 1 tablet        1 tablet Oral 2 times daily 01/05/22 1641 01/16/22 0759   01/02/22 1500  vancomycin (VANCOREADY) IVPB 1250 mg/250 mL  Status:  Discontinued        1,250 mg 166.7 mL/hr over 90 Minutes Intravenous Every 12 hours 01/02/22 1339 01/05/22 1632   01/02/22 1400  ceFEPIme (MAXIPIME) 2 g in sodium chloride 0.9 % 100 mL IVPB  Status:  Discontinued        2 g 200 mL/hr over 30 Minutes Intravenous Every 8 hours 01/02/22 1339 01/06/22 1124   01/02/22 0230  vancomycin (VANCOREADY) IVPB 2000 mg/400 mL  2,000 mg 200 mL/hr over 120 Minutes Intravenous  Once 01/02/22 0229 01/02/22 0550   01/02/22 0230  ceFEPIme (MAXIPIME) 2 g in sodium chloride 0.9 % 100 mL IVPB        2 g 200 mL/hr over 30 Minutes Intravenous  Once 01/02/22 0229 01/02/22 0346        Medications  Scheduled Meds:  apixaban  5 mg Oral BID   vitamin C  500 mg Oral Daily   escitalopram  5 mg Oral Daily   flecainide  100 mg Oral BID   furosemide  20 mg Intravenous Once   furosemide  40 mg Oral q morning   levothyroxine  75 mcg Oral QAC breakfast   losartan  50 mg Oral Daily   metoprolol tartrate  50 mg Oral BID   sulfamethoxazole-trimethoprim  1 tablet Oral BID   Continuous Infusions:   PRN Meds:.acetaminophen **OR** acetaminophen, ondansetron **OR** ondansetron (ZOFRAN) IV    Subjective:   [name] was seen and examined today.  Significant edema in the lower extremities.   Objective:   Vitals:   01/05/22 1632 01/05/22 2001 01/06/22 0532 01/06/22  0823  BP: 140/71 (!) 142/63 (!) 136/54 (!) 126/56  Pulse: 62 66 67 64  Resp: 18 17 16 18   Temp: 97.9 F (36.6 C) 98.2 F (36.8 C) 98.5 F (36.9 C) 97.6 F (36.4 C)  TempSrc: Oral Oral Oral Oral  SpO2: 100% 100% 96% 94%  Weight:      Height:        Intake/Output Summary (Last 24 hours) at 01/06/2022 1526 Last data filed at 01/06/2022 1059 Gross per 24 hour  Intake 240 ml  Output 500 ml  Net -260 ml    Filed Weights   01/01/22 2127  Weight: (!) 154.2 kg     Exam General exam: Appears calm and comfortable  Respiratory system: Clear to auscultation. Respiratory effort normal. Cardiovascular system: S1 & S2 heard, RRR. No JVD,  No pedal edema. Gastrointestinal system: Abdomen is nondistended, soft and nontender. Normal bowel sounds heard. Central nervous system: Alert and oriented. No focal neurological deficits. Extremities: improving cellulitis.  Skin: No rashes, lesions or ulcers Psychiatry: Judgement and insight appear normal. Mood & affect appropriate.     Data Reviewed:  I have personally reviewed following labs and imaging studies   CBC Lab Results  Component Value Date   WBC 9.7 01/05/2022   RBC 3.12 (L) 01/05/2022   HGB 8.5 (L) 01/05/2022   HCT 27.8 (L) 01/05/2022   MCV 89.1 01/05/2022   MCH 27.2 01/05/2022   PLT 289 01/05/2022   MCHC 30.6 01/05/2022   RDW 15.5 01/05/2022   LYMPHSABS 1.4 01/03/2022   MONOABS 0.9 01/03/2022   EOSABS 0.2 01/03/2022   BASOSABS 0.1 0000000     Last metabolic panel Lab Results  Component Value Date   NA 137 01/04/2022   K 4.1 01/04/2022   CL 107 01/04/2022   CO2 22 01/04/2022   BUN 36 (H) 01/04/2022   CREATININE 1.07 (H) 01/04/2022   GLUCOSE 99 01/04/2022   GFRNONAA 55 (L) 01/04/2022   GFRAA >60 12/07/2017   CALCIUM 8.3 (L) 01/04/2022   PHOS 3.1 09/10/2021   PROT 6.2 (L) 01/03/2022   ALBUMIN 2.4 (L) 01/03/2022   BILITOT 0.7 01/03/2022   ALKPHOS 52 01/03/2022   AST 18 01/03/2022   ALT 15 01/03/2022    ANIONGAP 8 01/04/2022    CBG (last 3)  No results for input(s): GLUCAP in the  last 72 hours.    Coagulation Profile: Recent Labs  Lab 01/01/22 2202  INR 1.8*      Radiology Studies: No results found.     Hosie Poisson M.D. [facility] 01/06/2022, 3:26 PM  Available via Epic secure chat 7am-7pm After 7 pm, please refer to night coverage provider listed on amion.

## 2022-01-06 NOTE — Progress Notes (Signed)
Physical Therapy Treatment Patient Details Name: [name] MRN: [id number] DOB: [birth year] Today's Date: 01/06/2022   History of Present Illness Pt is a 71 y.o. F who presents 01/01/2022 with increasing erythema of left thigh and left ankle, fever, and chills. Significant PMH: HTN, afib on Eliquis, chronic diastolic heart failure, chronic LLE wound.    PT Comments    Pt declined gait, having already walked and was more interested in strengthening legs.  Pt feels her endurance with SOB to walk is  her baseline, and is possibly at a point for discharge soon.  Follow acutely and consider her need for PT next session.   Recommendations for follow up therapy are one component of a multi-disciplinary discharge planning process, led by the attending physician.  Recommendations may be updated based on patient status, additional functional criteria and insurance authorization.  Follow Up Recommendations  Home health PT     Assistance Recommended at Discharge PRN  Patient can return home with the following A little help with walking and/or transfers;A little help with bathing/dressing/bathroom;Assistance with cooking/housework;Assist for transportation;Help with stairs or ramp for entrance   Equipment Recommendations  None recommended by PT    Recommendations for Other Services       Precautions / Restrictions Precautions Precautions: Fall Restrictions Weight Bearing Restrictions: No     Mobility  Bed Mobility               General bed mobility comments: up in chair    Transfers                   General transfer comment: declined to stand    Ambulation/Gait               General Gait Details: declined   Stairs             Wheelchair Mobility    Modified Rankin (Stroke Patients Only)       Balance                                            Cognition Arousal/Alertness: Awake/alert Behavior During Therapy: WFL for  tasks assessed/performed Overall Cognitive Status: Within Functional Limits for tasks assessed                                          Exercises General Exercises - Lower Extremity Ankle Circles/Pumps: AROM, 5 reps Quad Sets: AROM, 10 reps Gluteal Sets: AROM, 10 reps Heel Slides: AROM, 10 reps Hip ABduction/ADduction: AROM, 10 reps    General Comments General comments (skin integrity, edema, etc.): pt is in chair with LE's elevated and has both redness and edema on LLE.  No pain complaints of LLE despite the symptoms      Pertinent Vitals/Pain Pain Assessment Pain Assessment: Faces Faces Pain Scale: No hurt    Home Living                          Prior Function            PT Goals (current goals can now be found in the care plan section) Acute Rehab PT Goals Patient Stated Goal: get outside and garden    Frequency    Min 3X/week  PT Plan Current plan remains appropriate    Co-evaluation              AM-PAC PT "6 Clicks" Mobility   Outcome Measure  Help needed turning from your back to your side while in a flat bed without using bedrails?: None Help needed moving from lying on your back to sitting on the side of a flat bed without using bedrails?: None Help needed moving to and from a bed to a chair (including a wheelchair)?: A Little Help needed standing up from a chair using your arms (e.g., wheelchair or bedside chair)?: A Little Help needed to walk in hospital room?: A Little Help needed climbing 3-5 steps with a railing? : A Lot 6 Click Score: 19    End of Session   Activity Tolerance: Patient limited by fatigue Patient left: in chair;with call bell/phone within reach;with chair alarm set Nurse Communication: Mobility status PT Visit Diagnosis: Unsteadiness on feet (R26.81);Muscle weakness (generalized) (M62.81);Difficulty in walking, not elsewhere classified (R26.2)     Time: [id number] PT Time Calculation  (min) (ACUTE ONLY): 16 min  Charges:  $Therapeutic Exercise: 8-22 mins Ramond Dial 01/06/2022, 5:38 PM  Mee Hives, PT PhD Acute Rehab Dept. Number: [place] and [place]

## 2022-01-07 DIAGNOSIS — I10 Essential (primary) hypertension: Secondary | ICD-10-CM | POA: Diagnosis not present

## 2022-01-07 DIAGNOSIS — L03116 Cellulitis of left lower limb: Secondary | ICD-10-CM | POA: Diagnosis not present

## 2022-01-07 DIAGNOSIS — I5032 Chronic diastolic (congestive) heart failure: Secondary | ICD-10-CM | POA: Diagnosis not present

## 2022-01-07 DIAGNOSIS — E871 Hypo-osmolality and hyponatremia: Secondary | ICD-10-CM | POA: Diagnosis not present

## 2022-01-07 LAB — BASIC METABOLIC PANEL
Anion gap: 6 (ref 5–15)
BUN: 30 mg/dL — ABNORMAL HIGH (ref 8–23)
CO2: 25 mmol/L (ref 22–32)
Calcium: 8.8 mg/dL — ABNORMAL LOW (ref 8.9–10.3)
Chloride: 108 mmol/L (ref 98–111)
Creatinine, Ser: 0.99 mg/dL (ref 0.44–1.00)
GFR, Estimated: >60 mL/min (ref >60)
Glucose, Bld: 95 mg/dL (ref 70–99)
Potassium: 3.7 mmol/L (ref 3.5–5.1)
Sodium: 139 mmol/L (ref 135–145)

## 2022-01-07 MED ORDER — SULFAMETHOXAZOLE-TRIMETHOPRIM 800-160 MG PO TABS: 1.0000 | ORAL_TABLET | Freq: Two times a day (BID) | ORAL | 0 refills | Status: AC

## 2022-01-07 MED ORDER — FUROSEMIDE 10 MG/ML IJ SOLN
20.0000 mg | Freq: Once | INTRAMUSCULAR | Status: AC
  Administered 2022-01-07: 20 mg via INTRAVENOUS
  Filled 2022-01-07: qty 2

## 2022-01-07 MED ORDER — DAKINS (FULL STRENGTH) SOLUTION 0.5%
Freq: Two times a day (BID) | CUTANEOUS | Status: DC
  Filled 2022-01-07 (×2): qty 1000

## 2022-01-07 NOTE — Progress Notes (Signed)
Nursing DC note  Left leg dressing changed per wound care order. Patient tolerated procedure well.dc intructions reviewed with patient, verbalized understanding.all belongings and paperwork given to patient. Patients son coming to take her home.

## 2022-01-07 NOTE — Discharge Summary (Signed)
Physician Discharge Summary   Patient: [name] MRN: [id number] DOB: Sep 10, 1950  Admit date:     01/01/2022  Discharge date: 01/07/22  Discharge Physician: Kathlen Mody   PCP: [name], MD   Recommendations at discharge:  Please follow up with PCP in one week.    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  She also has a chronic left leg wound since February after a fall and hematoma.  She was admitted with severe left lower extremity cellulitis and placed on antibiotics. Assessment and Plan:  * Left leg cellulitis The erythema and tenderness has much improved.  Wbc normalized.  Afebrile.  Recommend to complete the course of oral antibiotics on discharge.      Hyponatremia Resolved.    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 Pt reports her wound is much better than baseline.    Morbid obesity with BMI of 50.0-59.9, adult (HCC) Chronic. BMI 53   Gastroesophageal reflux disease Stable.    Paroxysmal atrial fibrillation (HCC) Stable. Continue eliquis 5 mg bid for anticoagulation,  rate controlled with flecainide 100 mg bid, lopressor 50 mg bid. Keep serum k >4.0 and Mg >2.0   Chronic diastolic heart failure (HCC) Stable. Euvolemic. Continue lasix 40 mg daily, losartan 50 mg daily.   Hypothyroidism Stable.  continue synthroid 75 mcg daily   Essential hypertension Well controlled BP parameters.  Continue with lasix, losartan and lopressor.        Estimated body mass index is 53.24 kg/m as calculated from the following:   Height as of this encounter:  (1.702 m).   Weight as of this encounter: 154.2 kg.        Consultants: none.  Procedures performed: none.   Disposition: Home Diet recommendation:  Discharge Diet Orders (From admission, onward)     Start     Ordered   01/07/22 0000  Diet - low sodium heart healthy        01/07/22 1313           Cardiac diet DISCHARGE MEDICATION: Allergies as of 01/07/2022       Reactions   Augmentin [amoxicillin-pot Clavulanate] Hives, Itching   Has patient had a PCN reaction causing immediate rash, facial/tongue/throat swelling, SOB or lightheadedness with hypotension:No Has patient had a PCN reaction causing severe rash involving mucus membranes or skin necrosis:No Has patient had a PCN reaction that required hospitalization:No Has patient had a PCN reaction occurring within the last 10 years:No If all of the above answers are "NO", then may proceed with Cephalosporin use.   Pneumococcal Vaccines Other (See Comments)   Caused fever, and swelling at injection site        Medication List     TAKE these medications    acetaminophen 500 MG tablet Commonly known as: TYLENOL Take 1,000 mg by mouth every 6 (six) hours as needed  for mild pain or headache.   Eliquis 5 MG Tabs tablet Generic drug: apixaban TAKE 1 TABLET BY MOUTH 2 TIMES DAILY. What changed: how much to take   escitalopram 5 MG tablet Commonly known as: LEXAPRO Take 5 mg by mouth daily.   ferrous sulfate 325 (65 FE) MG tablet Take 325 mg by mouth 2 (two) times daily with a meal.   flecainide 100 MG tablet Commonly known as: TAMBOCOR TAKE 1 TABLET BY MOUTH 2 TIMES A DAY   furosemide 20 MG tablet Commonly known as: LASIX Take 40 mg by mouth every  morning.   levothyroxine 75 MCG tablet Commonly known as: SYNTHROID Take 75 mcg by mouth daily before breakfast.   losartan 50 MG tablet Commonly known as: COZAAR Take 50 mg by mouth daily.   metoprolol tartrate 50 MG tablet Commonly known as: LOPRESSOR Take 1 tablet (50 mg total) by mouth 2 (two) times daily.   sulfamethoxazole-trimethoprim 800-160 MG tablet Commonly known as: BACTRIM DS Take 1 tablet by mouth 2 (two) times daily for 7 days.   vitamin C 500 MG tablet Commonly known as: ASCORBIC ACID Take 500 mg by mouth daily.   zinc gluconate 50 MG tablet Take 50 mg by mouth daily.               Discharge Care Instructions  (From admission, onward)           Start     Ordered   01/07/22 0000  Discharge wound care:       Comments: Clean the left leg wound with NS. Pat dry with sterile 4 x 4 then Moisten the plain packing gauze [place] # [phone]) with Dakins and pack loosly into the the wound leaving a tail, then moisten a 4 x 4 with Dakin's and place over the remaining wound. Cover with ABD pads and wrap with Kerlix and Ace Wrap. Change twice daily.   01/07/22 1313            Follow-up Information     [facility].   Specialty: [facility] Contact information: [address] [place] [id number] [phone]                Discharge Exam: Ceasar MonsFiled Weights   01/01/22 2127  Weight: (!) 154.2 kg   General exam: Appears calm and comfortable  Respiratory system: Clear to auscultation. Respiratory effort normal. Cardiovascular system: S1 & S2 heard, RRR. No JVD,  No pedal edema. Gastrointestinal system: Abdomen is nondistended, soft and nontender. Normal bowel sounds heard. Central nervous system: Alert and oriented. No focal neurological deficits. Extremities: Symmetric 5 x 5 power. Skin: left lower extremity chronic wound. Right thigh cellulitis has improved.  Psychiatry:  Mood & affect appropriate.     Condition at discharge: fair  The results of significant diagnostics from this hospitalization (including imaging, microbiology, ancillary and laboratory) are listed below for reference.   Imaging Studies: DG Chest 2 View  Result Date: 01/01/2022 CLINICAL DATA:  Left leg wound with possible sepsis EXAM: CHEST - 2 VIEW COMPARISON:  10/19/2021 FINDINGS: The heart size and mediastinal contours are within normal limits. Both lungs are clear. The visualized skeletal structures are unremarkable. IMPRESSION: No active cardiopulmonary disease. Electronically Signed   By: Alcide CleverMark  Lukens M.D.   On: 01/01/2022 22:11   DG Tibia/Fibula Left  Result Date: 01/02/2022 CLINICAL DATA:  Wound EXAM: LEFT TIBIA AND FIBULA - 2 VIEW COMPARISON:  10/18/2021 FINDINGS: Soft  tissue ulceration/defect noted in the lateral soft tissues of the proximal left calf. Presumed packing material within the soft tissue defect. No underlying bony abnormality. No fracture, subluxation or dislocation. No destruction to suggest osteomyelitis. Changes of left knee replacement noted. IMPRESSION: Large soft tissue defect laterally in the proximal left calf soft tissues. No acute bony abnormality Electronically Signed   By: Charlett Nose M.D.   On: 01/02/2022 02:18   MR TIBIA FIBULA LEFT W WO CONTRAST  Result Date: 01/02/2022 CLINICAL DATA:  Chronic left leg wound. EXAM: MRI OF LOWER LEFT EXTREMITY WITHOUT AND WITH CONTRAST TECHNIQUE: Multiplanar, multisequence MR imaging of the spine do not split appear axials was performed both before and after administration of intravenous contrast. CONTRAST:  73mL GADAVIST GADOBUTROL 1 MMOL/ML IV SOLN COMPARISON:  Radiographs 01/02/2022 FINDINGS: Diffuse and marked skin thickening and subcutaneous edema and subsequent enhancement involving the lower half of the left lower extremity suggesting severe diffuse cellulitis. No discrete rim enhancing fluid collection to suggest a drainable soft tissue abscess. No  findings suspicious for myofasciitis or pyomyositis. No evidence of osteomyelitis. IMPRESSION: 1. Severe cellulitis involving the lower half of the left lower extremity. 2. No discrete rim enhancing fluid collection to suggest a drainable soft tissue abscess. 3. No findings suspicious for myofasciitis, pyomyositis or osteomyelitis. Electronically Signed   By: Rudie Meyer M.D.   On: 01/02/2022 11:01    Microbiology: Results for orders placed or performed during the hospital encounter of 01/01/22  Culture, blood (Routine x 2)     Status: None   Collection Time: 01/01/22  9:35 PM   Specimen: BLOOD RIGHT ARM  Result Value Ref Range Status   Specimen Description BLOOD RIGHT ARM  Final   Special Requests   Final    BOTTLES DRAWN AEROBIC AND ANAEROBIC Blood Culture adequate volume   Culture   Final    NO GROWTH 5 DAYS Performed at [facility], [address], [place], [place] [address]    Report Status 01/06/2022 FINAL  Final  Culture, blood (Routine x 2)     Status: None   Collection Time: 01/01/22 10:04 PM   Specimen: BLOOD LEFT HAND  Result Value Ref Range Status   Specimen Description BLOOD LEFT HAND  Final   Special Requests   Final    BOTTLES DRAWN AEROBIC ONLY Blood Culture adequate volume   Culture   Final    NO GROWTH 5 DAYS Performed at [facility], [address], [place], [place] [address]    Report Status 01/06/2022 FINAL  Final    Labs: CBC: Recent Labs  Lab 01/01/22 2202 01/03/22 0039 01/04/22 0158 01/05/22 0256  WBC 15.3* 9.6 9.5 9.7  NEUTROABS 13.2* 7.1  --   --   HGB 10.5* 9.2* 9.0* 8.5*  HCT 32.9* 29.4* 29.1* 27.8*  MCV 87.5 88.3 89.0 89.1  PLT 298 264 270 289   Basic Metabolic Panel: Recent Labs  Lab 01/01/22 2202 01/03/22 0039 01/04/22 0158 01/07/22 0242  NA 130* 136 137 139  K 5.0 4.3 4.1 3.7  CL 97* 105 107 108  CO2 23 24 22 25   GLUCOSE 106* 107* 99 95  BUN 44* 34* 36* 30*  CREATININE 1.10* 1.09* 1.07* 0.99  CALCIUM 8.9 8.5*  8.3* 8.8*  MG  --  2.1  --   --    Liver Function Tests: Recent Labs  Lab 01/01/22 2202 01/03/22 0039  AST 21 18  ALT 16 15  ALKPHOS  72 52  BILITOT 0.8 0.7  PROT 7.4 6.2*  ALBUMIN 3.1* 2.4*   CBG: No results for input(s): GLUCAP in the last 168 hours.  Discharge time spent: 39 minutes.   Signed: Kathlen Mody, MD [facility] 01/07/2022

## 2022-01-07 NOTE — Progress Notes (Signed)
SWOT RN completed d/c

## 2022-01-07 NOTE — Progress Notes (Signed)
Physical Therapy Treatment Patient Details Name: [name] MRN: [id number] DOB: [birth year] Today's Date: 01/07/2022   History of Present Illness Pt is a 71 y.o. F who presents 01/01/2022 with increasing erythema of left thigh and left ankle, fever, and chills. Significant PMH: HTN, afib on Eliquis, chronic diastolic heart failure, chronic LLE wound.    PT Comments    Pt was seen for discussion about her exercises along with a review.  Talked with pt about a tiny wound on the R second toe at the tip of the toe, which pt is aware of.  Talked about potentially needing to have her feet re-measured for fit of shoes.  Also discussed her standing posture with deep lumbar curve.  Pt was instructed in activation of Transversus abdominus, which she demonstrates being able to do.  Pt is getting some relief of back with this engagement during gait, as well as using pillows in sitting to fill the lumbar curve.  Follow along with her for progression of strengthening and safety of gait.  Recommendations for follow up therapy are one component of a multi-disciplinary discharge planning process, led by the attending physician.  Recommendations may be updated based on patient status, additional functional criteria and insurance authorization.  Follow Up Recommendations  Home health PT     Assistance Recommended at Discharge PRN  Patient can return home with the following A little help with walking and/or transfers;A little help with bathing/dressing/bathroom;Assistance with cooking/housework;Assist for transportation;Help with stairs or ramp for entrance   Equipment Recommendations  None recommended by PT    Recommendations for Other Services       Precautions / Restrictions Precautions Precautions: Fall Precaution Comments: unstable on LLE Restrictions Weight Bearing Restrictions: No     Mobility  Bed Mobility Overal bed mobility: Needs Assistance             General bed mobility  comments: up in chair    Transfers Overall transfer level: Modified independent Equipment used: Rolling walker (2 wheels) Transfers: Sit to/from Stand                  Ambulation/Gait Ambulation/Gait assistance: Supervision Gait Distance (Feet): 60 Feet Assistive device: Rolling walker (2 wheels) Gait Pattern/deviations: Step-through pattern, Decreased stride length, Decreased dorsiflexion - right, Decreased dorsiflexion - left, Shuffle Gait velocity: decreased Gait velocity interpretation: <1.31 ft/sec, indicative of household ambulator Pre-gait activities: assessment of posture General Gait Details: pt is demonstrating better postural control with instruction on spinal stabilization in standing   Stairs             Wheelchair Mobility    Modified Rankin (Stroke Patients Only)       Balance                                            Cognition Arousal/Alertness: Awake/alert Behavior During Therapy: WFL for tasks assessed/performed Overall Cognitive Status: Within Functional Limits for tasks assessed                                          Exercises General Exercises - Lower Extremity Ankle Circles/Pumps: AROM, 5 reps Quad Sets: AROM, 10 reps Gluteal Sets: AROM, 10 reps Other Exercises Other Exercises: hip rotation to neturalize her hip posture for gait  General Comments        Pertinent Vitals/Pain Pain Assessment Pain Assessment: Faces Faces Pain Scale: Hurts little more Pain Location: back with gait Pain Descriptors / Indicators: Grimacing, Guarding Pain Intervention(s): Limited activity within patient's tolerance, Monitored during session, Premedicated before session, Repositioned, Other (comment) (instructed in spinal control)    Home Living                          Prior Function            PT Goals (current goals can now be found in the care plan section) Acute Rehab PT  Goals Patient Stated Goal: get outside and garden Progress towards PT goals: Progressing toward goals    Frequency    Min 3X/week      PT Plan Current plan remains appropriate    Co-evaluation              AM-PAC PT "6 Clicks" Mobility   Outcome Measure  Help needed turning from your back to your side while in a flat bed without using bedrails?: None Help needed moving from lying on your back to sitting on the side of a flat bed without using bedrails?: None Help needed moving to and from a bed to a chair (including a wheelchair)?: A Little Help needed standing up from a chair using your arms (e.g., wheelchair or bedside chair)?: A Little Help needed to walk in hospital room?: A Little Help needed climbing 3-5 steps with a railing? : A Lot 6 Click Score: 19    End of Session Equipment Utilized During Treatment: Gait belt Activity Tolerance: Patient limited by fatigue;Patient limited by pain Patient left: in chair;with call bell/phone within reach;with chair alarm set;Other (comment) (repositioned back with pillows) Nurse Communication: Mobility status PT Visit Diagnosis: Unsteadiness on feet (R26.81);Muscle weakness (generalized) (M62.81);Difficulty in walking, not elsewhere classified (R26.2)     Time: [id number] PT Time Calculation (min) (ACUTE ONLY): 26 min  Charges:  $Gait Training: 8-22 mins $Therapeutic Exercise: 8-22 mins Ramond Dial 01/07/2022, 4:58 PM  Mee Hives, PT PhD Acute Rehab Dept. Number: [place] and [place]

## 2022-01-10 DIAGNOSIS — N189 Chronic kidney disease, unspecified: Secondary | ICD-10-CM | POA: Diagnosis not present

## 2022-01-10 DIAGNOSIS — I4891 Unspecified atrial fibrillation: Secondary | ICD-10-CM | POA: Diagnosis not present

## 2022-01-10 DIAGNOSIS — I130 Hypertensive heart and chronic kidney disease with heart failure and stage 1 through stage 4 chronic kidney disease, or unspecified chronic kidney disease: Secondary | ICD-10-CM | POA: Diagnosis not present

## 2022-01-10 DIAGNOSIS — T24032D Burn of unspecified degree of left lower leg, subsequent encounter: Secondary | ICD-10-CM | POA: Diagnosis not present

## 2022-01-10 DIAGNOSIS — Z96653 Presence of artificial knee joint, bilateral: Secondary | ICD-10-CM | POA: Diagnosis not present

## 2022-01-10 DIAGNOSIS — U071 COVID-19: Secondary | ICD-10-CM | POA: Diagnosis not present

## 2022-01-10 DIAGNOSIS — L03116 Cellulitis of left lower limb: Secondary | ICD-10-CM | POA: Diagnosis not present

## 2022-01-10 DIAGNOSIS — F325 Major depressive disorder, single episode, in full remission: Secondary | ICD-10-CM | POA: Diagnosis not present

## 2022-01-10 DIAGNOSIS — I5032 Chronic diastolic (congestive) heart failure: Secondary | ICD-10-CM | POA: Diagnosis not present

## 2022-01-10 DIAGNOSIS — E039 Hypothyroidism, unspecified: Secondary | ICD-10-CM | POA: Diagnosis not present

## 2022-01-10 DIAGNOSIS — Z9181 History of falling: Secondary | ICD-10-CM | POA: Diagnosis not present

## 2022-01-11 DIAGNOSIS — E039 Hypothyroidism, unspecified: Secondary | ICD-10-CM | POA: Diagnosis not present

## 2022-01-11 DIAGNOSIS — L03116 Cellulitis of left lower limb: Secondary | ICD-10-CM | POA: Diagnosis not present

## 2022-01-11 DIAGNOSIS — D500 Iron deficiency anemia secondary to blood loss (chronic): Secondary | ICD-10-CM | POA: Diagnosis not present

## 2022-01-11 DIAGNOSIS — E871 Hypo-osmolality and hyponatremia: Secondary | ICD-10-CM | POA: Diagnosis not present

## 2022-01-16 DIAGNOSIS — Z7901 Long term (current) use of anticoagulants: Secondary | ICD-10-CM | POA: Diagnosis not present

## 2022-01-16 DIAGNOSIS — J309 Allergic rhinitis, unspecified: Secondary | ICD-10-CM | POA: Diagnosis not present

## 2022-01-16 DIAGNOSIS — E039 Hypothyroidism, unspecified: Secondary | ICD-10-CM | POA: Diagnosis not present

## 2022-01-16 DIAGNOSIS — E871 Hypo-osmolality and hyponatremia: Secondary | ICD-10-CM | POA: Diagnosis not present

## 2022-01-16 DIAGNOSIS — T24332D Burn of third degree of left lower leg, subsequent encounter: Secondary | ICD-10-CM | POA: Diagnosis not present

## 2022-01-16 DIAGNOSIS — I4891 Unspecified atrial fibrillation: Secondary | ICD-10-CM | POA: Diagnosis not present

## 2022-01-16 DIAGNOSIS — D500 Iron deficiency anemia secondary to blood loss (chronic): Secondary | ICD-10-CM | POA: Diagnosis not present

## 2022-01-16 DIAGNOSIS — F325 Major depressive disorder, single episode, in full remission: Secondary | ICD-10-CM | POA: Diagnosis not present

## 2022-01-16 DIAGNOSIS — Z9049 Acquired absence of other specified parts of digestive tract: Secondary | ICD-10-CM | POA: Diagnosis not present

## 2022-01-16 DIAGNOSIS — I5032 Chronic diastolic (congestive) heart failure: Secondary | ICD-10-CM | POA: Diagnosis not present

## 2022-01-16 DIAGNOSIS — Z6841 Body Mass Index (BMI) 40.0 and over, adult: Secondary | ICD-10-CM | POA: Diagnosis not present

## 2022-01-16 DIAGNOSIS — Z96653 Presence of artificial knee joint, bilateral: Secondary | ICD-10-CM | POA: Diagnosis not present

## 2022-01-16 DIAGNOSIS — L03116 Cellulitis of left lower limb: Secondary | ICD-10-CM | POA: Diagnosis not present

## 2022-01-16 DIAGNOSIS — Z9181 History of falling: Secondary | ICD-10-CM | POA: Diagnosis not present

## 2022-01-16 DIAGNOSIS — I110 Hypertensive heart disease with heart failure: Secondary | ICD-10-CM | POA: Diagnosis not present

## 2022-01-19 DIAGNOSIS — E669 Obesity, unspecified: Secondary | ICD-10-CM | POA: Diagnosis not present

## 2022-01-19 DIAGNOSIS — I480 Paroxysmal atrial fibrillation: Secondary | ICD-10-CM | POA: Diagnosis not present

## 2022-01-19 DIAGNOSIS — I10 Essential (primary) hypertension: Secondary | ICD-10-CM | POA: Diagnosis not present

## 2022-01-19 DIAGNOSIS — E039 Hypothyroidism, unspecified: Secondary | ICD-10-CM | POA: Diagnosis not present

## 2022-01-20 ENCOUNTER — Encounter (HOSPITAL_BASED_OUTPATIENT_CLINIC_OR_DEPARTMENT_OTHER): Payer: PPO | Attending: Internal Medicine | Admitting: Internal Medicine

## 2022-01-20 DIAGNOSIS — T798XXA Other early complications of trauma, initial encounter: Secondary | ICD-10-CM | POA: Insufficient documentation

## 2022-01-20 DIAGNOSIS — I5032 Chronic diastolic (congestive) heart failure: Secondary | ICD-10-CM | POA: Insufficient documentation

## 2022-01-20 DIAGNOSIS — N189 Chronic kidney disease, unspecified: Secondary | ICD-10-CM | POA: Diagnosis not present

## 2022-01-20 DIAGNOSIS — W19XXXA Unspecified fall, initial encounter: Secondary | ICD-10-CM | POA: Insufficient documentation

## 2022-01-20 DIAGNOSIS — Z7901 Long term (current) use of anticoagulants: Secondary | ICD-10-CM | POA: Insufficient documentation

## 2022-01-20 DIAGNOSIS — E039 Hypothyroidism, unspecified: Secondary | ICD-10-CM | POA: Diagnosis not present

## 2022-01-20 DIAGNOSIS — L97822 Non-pressure chronic ulcer of other part of left lower leg with fat layer exposed: Secondary | ICD-10-CM

## 2022-01-20 DIAGNOSIS — I890 Lymphedema, not elsewhere classified: Secondary | ICD-10-CM | POA: Insufficient documentation

## 2022-01-20 DIAGNOSIS — I480 Paroxysmal atrial fibrillation: Secondary | ICD-10-CM | POA: Diagnosis not present

## 2022-01-20 DIAGNOSIS — I130 Hypertensive heart and chronic kidney disease with heart failure and stage 1 through stage 4 chronic kidney disease, or unspecified chronic kidney disease: Secondary | ICD-10-CM | POA: Diagnosis not present

## 2022-01-20 NOTE — Progress Notes (Signed)
Christine, [name] ([id number]) Visit Report for 01/20/2022 Arrival Information Details Patient Name: Date of Service: [name] 01/20/2022 2:15 PM Medical Record Number: [id number] Patient Account Number: [id number] Date of Birth/Sex: Treating RN: 12-19-1950 (71 y.o. [name], Tammi Klippel Primary Care Jamice Carreno: Deland Pretty Other Clinician: Referring Taffie Eckmann: Treating Laterra Lubinski/Extender: Judie Grieve in Treatment: 8 Visit Information History Since Last Visit Added or deleted any medications: No Patient Arrived: Wheel Chair Any new allergies or adverse reactions: No Arrival Time: 14:12 Had a fall or experienced change in No Accompanied By: self activities of daily living that may affect Transfer Assistance: Manual risk of falls: Patient Identification Verified: Yes Signs or symptoms of abuse/neglect since last visito No Secondary Verification Process Completed: Yes Hospitalized since last visit: Yes Patient Requires Transmission-Based Precautions: No Implantable device outside of the clinic excluding No Patient Has Alerts: Yes cellular tissue based products placed in the center Patient Alerts: Patient on Blood Thinner since last visit: Has Dressing in Place as Prescribed: Yes Pain Present Now: Yes Electronic Signature(s) Signed: 01/20/2022 4:48:29 PM By: Deon Pilling RN, BSN Entered By: Deon Pilling on 01/20/2022 14:12:35 -------------------------------------------------------------------------------- Encounter Discharge Information Details Patient Name: Date of Service: Christine Debar [name] 01/20/2022 2:15 PM Medical Record Number: [id number] Patient Account Number: [id number] Date of Birth/Sex: Treating RN: 1951/04/10 (71 y.o. [name], Tammi Klippel Primary Care Chanique Duca: Deland Pretty Other Clinician: Referring Raynaldo Falco: Treating Leela Vanbrocklin/Extender: Judie Grieve in Treatment: 8 Encounter Discharge Information Items Post  Procedure Vitals Discharge Condition: Stable Temperature (F): 98.5 Ambulatory Status: Wheelchair Pulse (bpm): 59 Discharge Destination: Home Respiratory Rate (breaths/min): 20 Transportation: Private Auto Blood Pressure (mmHg): 145/78 Accompanied By: self Schedule Follow-up Appointment: Yes Clinical Summary of Care: Electronic Signature(s) Signed: 01/20/2022 4:48:29 PM By: Deon Pilling RN, BSN Entered By: Deon Pilling on 01/20/2022 14:43:20 -------------------------------------------------------------------------------- Lower Extremity Assessment Details Patient Name: Date of Service: [name]. 01/20/2022 2:15 PM Medical Record Number: [id number] Patient Account Number: [id number] Date of Birth/Sex: Treating RN: 09-22-1950 (71 y.o. [name] Primary Care Leyton Brownlee: Deland Pretty Other Clinician: Referring Lauretta Sallas: Treating Rylan Bernard/Extender: Judie Grieve in Treatment: 8 Edema Assessment Assessed: Shirlyn Goltz: Yes] Patrice Paradise: No] Edema: [Left: Ye] [Right: s] Calf Left: Right: Point of Measurement: 31 cm From Medial Instep 46 cm Ankle Left: Right: Point of Measurement: 9 cm From Medial Instep 26 cm Vascular Assessment Pulses: Dorsalis Pedis Palpable: [Left:Yes] Electronic Signature(s) Signed: 01/20/2022 4:48:29 PM By: Deon Pilling RN, BSN Entered By: Deon Pilling on 01/20/2022 14:20:16 -------------------------------------------------------------------------------- Multi Wound Chart Details Patient Name: Date of Service: [name]. 01/20/2022 2:15 PM Medical Record Number: [id number] Patient Account Number: [id number] Date of Birth/Sex: Treating RN: May 20, 1951 (71 y.o. F) Primary Care Davyd Podgorski: Deland Pretty Other Clinician: Referring Mancil Pfenning: Treating Lorelle Macaluso/Extender: Judie Grieve in Treatment: 8 Vital Signs Height(in): 67 Pulse(bpm): 74 Weight(lbs): 340 Blood Pressure(mmHg): 145/78 Body  Mass Index(BMI): 53.2 Temperature(F): 98.5 Respiratory Rate(breaths/min): 20 Photos: [1:Left, Lateral Lower Leg] [N/A:N/A N/A] Wound Location: [1:Trauma] [N/A:N/A] Wounding Event: [1:Trauma, Other] [N/A:N/A] Primary Etiology: [1:Congestive Heart Failure,] [N/A:N/A] Comorbid History: [1:Hypertension 10/18/2021] [N/A:N/A] Date Acquired: [1:8] [N/A:N/A] Weeks of Treatment: [1:Open] [N/A:N/A] Wound Status: [1:No] [N/A:N/A] Wound Recurrence: [1:Yes] [N/A:N/A] Clustered Wound: [1:1] [N/A:N/A] Clustered Quantity: [1:5.8x9x3.2] [N/A:N/A] Measurements L x W x D (cm) [1:40.998] [N/A:N/A] A (cm) : rea [1:131.193] [N/A:N/A] Volume (cm) : [1:78.80%] [N/A:N/A] % Reduction in A [1:rea: 92.00%] [N/A:N/A] % Reduction in Volume: [1:12] Starting Position 1 (o'clock): [1:12]  Ending Position 1 (o'clock): [1:3.7] Maximum Distance 1 (cm): [1:Yes] [N/A:N/A] Undermining: [1:Full Thickness With Exposed Support N/A] Classification: [1:Structures Medium] [N/A:N/A] Exudate A mount: [1:Serosanguineous] [N/A:N/A] Exudate Type: [1:red, brown] [N/A:N/A] Exudate Color: [1:Flat and Intact] [N/A:N/A] Wound Margin: [1:Large (67-100%)] [N/A:N/A] Granulation A mount: [1:Red, Friable] [N/A:N/A] Granulation Quality: [1:Small (1-33%)] [N/A:N/A] Necrotic A mount: [1:Fat Layer (Subcutaneous Tissue): Yes N/A] Exposed Structures: [1:Muscle: Yes Fascia: No Tendon: No Joint: No Bone: No Small (1-33%)] [N/A:N/A] Epithelialization: [1:Debridement - Excisional] [N/A:N/A] Debridement: Pre-procedure Verification/Time Out 14:35 [N/A:N/A] Taken: [1:Other] [N/A:N/A] Pain Control: [1:Subcutaneous, Slough] [N/A:N/A] Tissue Debrided: [1:Skin/Subcutaneous Tissue] [N/A:N/A] Level: [1:17.4] [N/A:N/A] Debridement A (sq cm): [1:rea Curette] [N/A:N/A] Instrument: [1:Minimum] [N/A:N/A] Bleeding: [1:Silver Nitrate] [N/A:N/A] Hemostasis A chieved: [1:0] [N/A:N/A] Procedural Pain: [1:0] [N/A:N/A] Post Procedural Pain: [1:Procedure was  tolerated well] [N/A:N/A] Debridement Treatment Response: [1:5.8x9x3.2] [N/A:N/A] Post Debridement Measurements L x W x D (cm) [1:131.193] [N/A:N/A] Post Debridement Volume: (cm) [1:Debridement] [N/A:N/A] Treatment Notes Wound #1 (Lower Leg) Wound Laterality: Left, Lateral Cleanser Wound Cleanser Discharge Instruction: Cleanse the wound with wound cleanser prior to applying a clean dressing using gauze sponges, not tissue or cotton balls. Peri-Wound Care Topical Primary Dressing Plain packing strip 1/2 (in) Discharge Instruction: ****Or Conforming Stretch Gauze Bandage, Sterile 2x75 (in/in)**** Lightly pack as instructed. May use 1" or conform gauze Dakin's Solution 0.25%, 16 (oz) Discharge Instruction: Moisten gauze with Dakin's solution Secondary Dressing ABD Pad, 8x10 Discharge Instruction: Apply over primary dressing as directed. Woven Gauze Sponge, Non-Sterile 4x4 in Discharge Instruction: Apply over primary dressing as directed. Zetuvit Plus 4x8 in Discharge Instruction: Apply over primary dressing as directed. Secured With Elastic Bandage 4 inch (ACE bandage) Discharge Instruction: Secure with ACE bandage as directed. Kerlix Roll Sterile, 4.5x3.1 (in/yd) Discharge Instruction: Secure with Kerlix as directed. 49M Medipore H Soft Cloth Surgical T ape, 4 x 10 (in/yd) Discharge Instruction: Secure with tape as directed. Compression Wrap Compression Stockings Add-Ons Electronic Signature(s) Signed: 01/20/2022 4:24:05 PM By: Kalman Shan DO Entered By: Kalman Shan on 01/20/2022 16:19:10 -------------------------------------------------------------------------------- Multi-Disciplinary Care Plan Details Patient Name: Date of Service: Link Snuffer, [place]. 01/20/2022 2:15 PM Medical Record Number: [id number] Patient Account Number: [id number] Date of Birth/Sex: Treating RN: [birth year] (71 y.o. [name], Meta.Reding Primary Care Montavious Wierzba: Deland Pretty Other  Clinician: Referring Johnney Scarlata: Treating Marvion Bastidas/Extender: Judie Grieve in Treatment: 8 Active Inactive Abuse / Safety / Falls / Self Care Management Nursing Diagnoses: History of Falls Potential for falls Goals: Patient/caregiver will verbalize/demonstrate measure taken to improve self care Date Initiated: 11/19/2021 Target Resolution Date: 02/18/2022 Goal Status: Active Patient/caregiver will verbalize/demonstrate measures taken to prevent injury and/or falls Date Initiated: 11/19/2021 Target Resolution Date: 02/18/2022 Goal Status: Active Interventions: Provide education on basic hygiene Provide education on fall prevention Provide education on HBO safety Provide education on vaccinations Notes: Necrotic Tissue Nursing Diagnoses: Impaired tissue integrity related to necrotic/devitalized tissue Goals: Necrotic/devitalized tissue will be minimized in the wound bed Date Initiated: 11/19/2021 Target Resolution Date: 02/18/2022 Goal Status: Active Patient/caregiver will verbalize understanding of reason and process for debridement of necrotic tissue Date Initiated: 11/19/2021 Target Resolution Date: 02/18/2022 Goal Status: Active Interventions: Assess patient pain level pre-, during and post procedure and prior to discharge Provide education on necrotic tissue and debridement process Treatment Activities: Apply topical anesthetic as ordered : 11/19/2021 Excisional debridement : 11/19/2021 Notes: Pain, Acute or Chronic Nursing Diagnoses: Pain, acute or chronic: actual or potential Potential alteration in comfort, pain Goals: Patient will verbalize adequate pain control and receive pain control  interventions during procedures as needed Date Initiated: 11/19/2021 Target Resolution Date: 02/18/2022 Goal Status: Active Patient/caregiver will verbalize comfort level met Date Initiated: 11/19/2021 Target Resolution Date: 02/18/2022 Goal Status:  Active Interventions: Encourage patient to take pain medications as prescribed Provide education on pain management Reposition patient for comfort Treatment Activities: Administer pain control measures as ordered : 11/19/2021 Notes: Electronic Signature(s) Signed: 01/20/2022 4:48:29 PM By: Deon Pilling RN, BSN Entered By: Deon Pilling on 01/20/2022 14:23:02 -------------------------------------------------------------------------------- Pain Assessment Details Patient Name: Date of Service: [name]. 01/20/2022 2:15 PM Medical Record Number: [id number] Patient Account Number: [id number] Date of Birth/Sex: Treating RN: 19-Jan-1951 (71 y.o. [name] Primary Care Constance Whittle: Deland Pretty Other Clinician: Referring Ahmyah Gidley: Treating Shakeisha Horine/Extender: Judie Grieve in Treatment: 8 Active Problems Location of Pain Severity and Description of Pain Patient Has Paino Yes Site Locations Pain Location: Pain Location: Generalized Pain, Pain in Ulcers Rate the pain. Current Pain Level: 1 Pain Management and Medication Current Pain Management: Medication: No Cold Application: No Rest: No Massage: No Activity: No T.E.N.S.: No Heat Application: No Leg drop or elevation: No Is the Current Pain Management Adequate: Adequate How does your wound impact your activities of daily livingo Sleep: No Bathing: No Appetite: No Relationship With Others: No Bladder Continence: No Emotions: No Bowel Continence: No Work: No Toileting: No Drive: No Dressing: No Hobbies: No Electronic Signature(s) Signed: 01/20/2022 4:48:29 PM By: Deon Pilling RN, BSN Entered By: Deon Pilling on 01/20/2022 14:16:05 -------------------------------------------------------------------------------- Patient/Caregiver Education Details Patient Name: Date of Service: [name] 6/1/2023andnbsp2:15 PM Medical Record Number: [id number] Patient Account Number:  [id number] Date of Birth/Gender: Treating RN: [birth year] (71 y.o. [name] Primary Care Physician: Deland Pretty Other Clinician: Referring Physician: Treating Physician/Extender: Judie Grieve in Treatment: 8 Education Assessment Education Provided To: Patient Education Topics Provided Wound Debridement: Handouts: Wound Debridement Methods: Explain/Verbal Responses: Reinforcements needed Electronic Signature(s) Signed: 01/20/2022 4:48:29 PM By: Deon Pilling RN, BSN Entered By: Deon Pilling on 01/20/2022 14:23:23 -------------------------------------------------------------------------------- Wound Assessment Details Patient Name: Date of Service: [name]. 01/20/2022 2:15 PM Medical Record Number: [id number] Patient Account Number: [id number] Date of Birth/Sex: Treating RN: 1951/04/01 (71 y.o. [name], Meta.Reding Primary Care Arely Tinner: Deland Pretty Other Clinician: Referring Wray Goehring: Treating Breella Vanostrand/Extender: Judie Grieve in Treatment: 8 Wound Status Wound Number: 1 Primary Etiology: Trauma, Other Wound Location: Left, Lateral Lower Leg Wound Status: Open Wounding Event: Trauma Comorbid History: Congestive Heart Failure, Hypertension Date Acquired: 10/18/2021 Weeks Of Treatment: 8 Clustered Wound: Yes Photos Wound Measurements Length: (cm) 5.8 Width: (cm) 9 Depth: (cm) 3.2 Clustered Quantity: 1 Area: (cm) 40.998 Volume: (cm) 131.193 % Reduction in Area: 78.8% % Reduction in Volume: 92% Epithelialization: Small (1-33%) Tunneling: No Undermining: Yes Starting Position (o'clock): 12 Ending Position (o'clock): 12 Maximum Distance: (cm) 3.7 Wound Description Classification: Full Thickness With Exposed Support Structures Wound Margin: Flat and Intact Exudate Amount: Medium Exudate Type: Serosanguineous Exudate Color: red, brown Foul Odor After Cleansing: No Slough/Fibrino Yes Wound  Bed Granulation Amount: Large (67-100%) Exposed Structure Granulation Quality: Red, Friable Fascia Exposed: No Necrotic Amount: Small (1-33%) Fat Layer (Subcutaneous Tissue) Exposed: Yes Necrotic Quality: Adherent Slough Tendon Exposed: No Muscle Exposed: Yes Necrosis of Muscle: No Joint Exposed: No Bone Exposed: No Treatment Notes Wound #1 (Lower Leg) Wound Laterality: Left, Lateral Cleanser Wound Cleanser Discharge Instruction: Cleanse the wound with wound cleanser prior to applying a clean dressing using gauze sponges, not tissue  or cotton balls. Peri-Wound Care Topical Primary Dressing Plain packing strip 1/2 (in) Discharge Instruction: ****Or Conforming Stretch Gauze Bandage, Sterile 2x75 (in/in)**** Lightly pack as instructed. May use 1" or conform gauze Dakin's Solution 0.25%, 16 (oz) Discharge Instruction: Moisten gauze with Dakin's solution Secondary Dressing ABD Pad, 8x10 Discharge Instruction: Apply over primary dressing as directed. Woven Gauze Sponge, Non-Sterile 4x4 in Discharge Instruction: Apply over primary dressing as directed. Zetuvit Plus 4x8 in Discharge Instruction: Apply over primary dressing as directed. Secured With Elastic Bandage 4 inch (ACE bandage) Discharge Instruction: Secure with ACE bandage as directed. Kerlix Roll Sterile, 4.5x3.1 (in/yd) Discharge Instruction: Secure with Kerlix as directed. 62M Medipore H Soft Cloth Surgical T ape, 4 x 10 (in/yd) Discharge Instruction: Secure with tape as directed. Compression Wrap Compression Stockings Add-Ons Electronic Signature(s) Signed: 01/20/2022 4:48:29 PM By: Deon Pilling RN, BSN Entered By: Deon Pilling on 01/20/2022 14:24:50 -------------------------------------------------------------------------------- Vitals Details Patient Name: Date of Service: Link Snuffer, [name] 01/20/2022 2:15 PM Medical Record Number: [id number] Patient Account Number: [id number] Date of Birth/Sex: Treating  RN: 26-Apr-1951 (71 y.o. [name], Meta.Reding Primary Care Jaidyn Kuhl: Deland Pretty Other Clinician: Referring Miamor Ayler: Treating Jermario Kalmar/Extender: Judie Grieve in Treatment: 8 Vital Signs Time Taken: 14:10 Temperature (F): 98.5 Height (in): 67 Pulse (bpm): 59 Weight (lbs): 340 Respiratory Rate (breaths/min): 20 Body Mass Index (BMI): 53.2 Blood Pressure (mmHg): 145/78 Reference Range: 80 - 120 mg / dl Electronic Signature(s) Signed: 01/20/2022 4:48:29 PM By: Deon Pilling RN, BSN Entered By: Deon Pilling on 01/20/2022 14:15:54

## 2022-01-20 NOTE — Progress Notes (Signed)
Christine, [name] ([id number]) Visit Report for 01/20/2022 Chief Complaint Document Details Patient Name: Date of Service: [name] 01/20/2022 2:15 PM Medical Record Number: [id number] Patient Account Number: [id number] Date of Birth/Sex: Treating RN: 1951-06-05 (71 y.o. F) Primary Care Provider: Deland Pretty Other Clinician: Referring Provider: Treating Provider/Extender: Judie Grieve in Treatment: 8 Information Obtained from: Patient Chief Complaint 11/19/2021; Left lower extremity wound status post fall Electronic Signature(s) Signed: 01/20/2022 4:24:05 PM By: Kalman Shan DO Entered By: Kalman Shan on 01/20/2022 16:19:29 -------------------------------------------------------------------------------- Debridement Details Patient Name: Date of Service: Christine Debar [name] 01/20/2022 2:15 PM Medical Record Number: [id number] Patient Account Number: [id number] Date of Birth/Sex: Treating RN: 27-Jul-1951 (71 y.o. [name], Meta.Reding Primary Care Provider: Deland Pretty Other Clinician: Referring Provider: Treating Provider/Extender: Judie Grieve in Treatment: 8 Debridement Performed for Assessment: Wound #1 Left,Lateral Lower Leg Performed By: Physician Kalman Shan, DO Debridement Type: Debridement Level of Consciousness (Pre-procedure): Awake and Alert Pre-procedure Verification/Time Out Yes - 14:35 Taken: Start Time: 14:36 Pain Control: Other : benzocaine 20% T Area Debrided (L x W): otal 3 (cm) x 5.8 (cm) = 17.4 (cm) Tissue and other material debrided: Viable, Non-Viable, Slough, Subcutaneous, Skin: Dermis , Skin: Epidermis, Fibrin/Exudate, Slough Level: Skin/Subcutaneous Tissue Debridement Description: Excisional Instrument: Curette Bleeding: Minimum Hemostasis Achieved: Silver Nitrate End Time: 14:39 Procedural Pain: 0 Post Procedural Pain: 0 Response to Treatment: Procedure was tolerated  well Level of Consciousness (Post- Awake and Alert procedure): Post Debridement Measurements of Total Wound Length: (cm) 5.8 Width: (cm) 9 Depth: (cm) 3.2 Volume: (cm) 131.193 Character of Wound/Ulcer Post Debridement: Improved Post Procedure Diagnosis Same as Pre-procedure Electronic Signature(s) Signed: 01/20/2022 4:24:05 PM By: Kalman Shan DO Signed: 01/20/2022 4:48:29 PM By: Deon Pilling RN, BSN Entered By: Deon Pilling on 01/20/2022 14:40:15 -------------------------------------------------------------------------------- HPI Details Patient Name: Date of Service: [name], Christine [name] 01/20/2022 2:15 PM Medical Record Number: [id number] Patient Account Number: [id number] Date of Birth/Sex: Treating RN: 1950/09/15 (71 y.o. F) Primary Care Provider: Deland Pretty Other Clinician: Referring Provider: Treating Provider/Extender: Judie Grieve in Treatment: 8 History of Present Illness HPI Description: Admission 11/19/2021 [name] is a 70 year old female with a past medical history of paroxysmal A-fib on Eliquis, hypothyroidism, major depressive disorder, venous insufficiency and chronic diastolic heart failure that presents to the clinic for a 1 month history of nonhealing wound to the left lower extremity. She visited the ED on 10/18/2021 after a mechanical fall. She developed a hematoma that subsequently opened. She was hospitalized for 7 days and discharged on 10/25/2021. She has been on several different antibiotics For the past month. She states that most recently she was on Levaquin and linezolid for the past week. She states she completes her antibiotic course tomorrow. She has been using Dakin's wet-to-dry dressings to the wound bed. She denies signs of infection. 4/7; patient presents for follow-up. She has been using Dakin's wet-to-dry dressings. She did end up going to the ED on 4/1 because she had excess bleeding with dressing change  that she could not stop. She is on Eliquis for A-fib. In the ED they tied off a small artery. She has had no issues since discharge. She denies signs of infection. 4/14; this is a very difficult clinical situation. A patient with underlying chronic venous insufficiency and lymphedema very significant lower extremity edema had a hematoma after a fall on her left upper lateral lower leg. She is on Eliquis for  atrial fibrillation apparently with a history of a splenic infarct following with Dr. Jolyn Nap of cardiology. She has exhibited significant bleeding from the wound surface including ao Venous bleeder that required suturing short while ago. She has been using Dakin's wet-to-dry packing and over the surface of the wound. She saw Dr. Caryl Comes yesterday he is reluctant to consider stopping the Eliquis because of the prior history of presumed cardioembolism. Wants to communicate with Dr. Heber [place] when she returns. In a perfect world where she was not on Eliquis she requires a wound VAC with additional compression wraps but I understand the reluctance to do this because of the concerns of bleeding 4/28; the patient's wound actually looks better today using Dakin's wet-to-dry that she is changing twice a day she is wrapping this with Kerlix and Ace wrapping. She tells me she had 2 small bleeding areas which were part of the superficial wound that stopped this week with direct pressure. Other than that no major issues. In follow-up from discussion of last week Dr. 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 The bigger question from my point of view is the wound VAC issue. As far as she knows and her daughter-in-law verifies that she has not had any bleeding from the deeper parts of the wound although the bleeding has been superficial including  the one that sent her to the ER for stitches. She is concerned that a wound VAC would cause further bleeding and I cannot completely allay those concerns. 5/8; patient presents for follow-up. She has no issues or complaints today. She has been using Dakin's wet-to-dry dressings without issues. She denies signs of infection. 5/12; patient presents for follow-up. She continues to use Dakin's wet-to-dry dressings without issues. She denies signs of infection. She reports some issues with bleeding at times but this has improved. She denies signs of infection. 6/1; patient presents for follow-up. She was recently hospitalized for upper left leg thigh cellulitis. She was given IV cefepime and vancomycin and discharged on oral antibiotics. She has been using Dakin's wet-to-dry dressings to the left lower leg wound. She reports improvement in healing. She currently denies systemic signs of infection. Electronic Signature(s) Signed: 01/20/2022 4:24:05 PM By: Kalman Shan DO Entered By: Kalman Shan on 01/20/2022 16:21:29 -------------------------------------------------------------------------------- Physical Exam Details Patient Name: Date of Service: [name] 01/20/2022 2:15 PM Medical Record Number: [id number] Patient Account Number: [id number] Date of Birth/Sex: Treating RN: June 24, 1951 (71 y.o. F) Primary Care Provider: Deland Pretty Other Clinician: Referring Provider: Treating Provider/Extender: Judie Grieve in Treatment: 8 Constitutional respirations regular, non-labored and within target range for patient.. Cardiovascular 2+ dorsalis pedis/posterior tibialis pulses. Psychiatric pleasant and cooperative. Notes Left lower extremity: T the lateral aspect there is an open wound with hyper granulated tissue at the surface and slough to the depth of the wound. o Electronic Signature(s) Signed: 01/20/2022 4:24:05 PM By: Kalman Shan DO Entered By:  Kalman Shan on 01/20/2022 16:22:22 -------------------------------------------------------------------------------- Physician Orders Details Patient Name: Date of Service: [name], Erick Blinks [name] 01/20/2022 2:15 PM Medical Record Number: [id number] Patient Account Number: [id number] Date of Birth/Sex: Treating RN: 1950/12/28 (71 y.o. [name] Primary Care Provider: Deland Pretty Other Clinician: Referring Provider: Treating Provider/Extender: Judie Grieve in Treatment: 8 Verbal / Phone Orders: No Diagnosis Coding ICD-10  Coding Code Description [phone] Non-pressure chronic ulcer of other part of left lower leg with fat layer exposed T79.8XXA Other early complications of trauma, initial encounter I48.0 Paroxysmal atrial fibrillation Z79.01 Long term (current) use of anticoagulants Follow-up Appointments ppointment in 1 week. - Dr. Heber [place] and [place], Room 8 Wednesday 130pm 01/26/2022 Return A Dr. Heber [place] and [place], Room 8 Thursday 130pm 02/03/2022 Bathing/ Shower/ Hygiene May shower with protection but do not get wound dressing(s) wet. Edema Control - Lymphedema / SCD / Other Elevate legs to the level of the heart or above for 30 minutes daily and/or when sitting, a frequency of: - 3-4 times a day throughout the day. Avoid standing for long periods of time. Home Health No change in wound care orders this week; continue Home Health for wound care. May utilize formulary equivalent dressing for wound treatment orders unless otherwise specified. - 2x a week dressing changes and all other days family member to change. Other Home Health Orders/Instructions: - [facility] Wound Treatment Wound #1 - Lower Leg Wound Laterality: Left, Lateral Cleanser: Wound Cleanser (Home Health) 2 x Per Day/30 Days Discharge Instructions: Cleanse the wound with wound cleanser prior to applying a clean dressing using gauze sponges, not tissue or cotton balls. Prim Dressing:  Plain packing strip 1/2 (in) 2 x Per Day/30 Days ary Discharge Instructions: ****Or Conforming Stretch Gauze Bandage, Sterile 2x75 (in/in)**** Lightly pack as instructed. May use 1" or conform gauze Prim Dressing: Dakin's Solution 0.25%, 16 (oz) (Home Health) 2 x Per Day/30 Days ary Discharge Instructions: Moisten gauze with Dakin's solution Secondary Dressing: ABD Pad, 8x10 (Home Health) 2 x Per Day/30 Days Discharge Instructions: Apply over primary dressing as directed. Secondary Dressing: Woven Gauze Sponge, Non-Sterile 4x4 in (Home Health) 2 x Per Day/30 Days Discharge Instructions: Apply over primary dressing as directed. Secondary Dressing: Zetuvit Plus 4x8 in [facility]) 2 x Per Day/30 Days Discharge Instructions: Apply over primary dressing as directed. Secured With: Elastic Bandage 4 inch (ACE bandage) (Home Health) 2 x Per Day/30 Days Discharge Instructions: Secure with ACE bandage as directed. Secured With: [employer], 4.5x3.1 (in/yd) (Home Health) 2 x Per Day/30 Days Discharge Instructions: Secure with Kerlix as directed. Secured With: 32M Medipore H Soft Cloth Surgical T ape, 4 x 10 (in/yd) (Home Health) 2 x Per Day/30 Days Discharge Instructions: Secure with tape as directed. Electronic Signature(s) Signed: 01/20/2022 4:24:05 PM By: Kalman Shan DO Entered By: Kalman Shan on 01/20/2022 16:22:31 -------------------------------------------------------------------------------- Problem List Details Patient Name: Date of Service: Christine Debar [name] 01/20/2022 2:15 PM Medical Record Number: [id number] Patient Account Number: [id number] Date of Birth/Sex: Treating RN: [birth year] (71 y.o. [name] Primary Care Provider: Deland Pretty Other Clinician: Referring Provider: Treating Provider/Extender: Judie Grieve in Treatment: 8 Active Problems ICD-10 Encounter Code Description Active Date MDM Diagnosis [phone] Non-pressure  chronic ulcer of other part of left lower leg with fat layer exposed3/31/2023 No Yes T79.8XXA Other early complications of trauma, initial encounter 11/19/2021 No Yes I48.0 Paroxysmal atrial fibrillation 11/19/2021 No Yes Z79.01 Long term (current) use of anticoagulants 11/19/2021 No Yes Inactive Problems Resolved Problems Electronic Signature(s) Signed: 01/20/2022 4:24:05 PM By: Kalman Shan DO Entered By: Kalman Shan on 01/20/2022 16:19:05 -------------------------------------------------------------------------------- Progress Note Details Patient Name: Date of Service: [name] 01/20/2022 2:15 PM Medical Record Number: [id number] Patient Account Number: [id number] Date of Birth/Sex: Treating RN: [birth year] (71 y.o. F) Primary Care Provider: Deland Pretty Other Clinician: Referring Provider: Treating Provider/Extender: Heber [place]  Edmonia Caprio, Charlean Sanfilippo in Treatment: 8 Subjective Chief Complaint Information obtained from Patient 11/19/2021; Left lower extremity wound status post fall History of Present Illness (HPI) Admission 11/19/2021 Ms. Tahje Walkowski is a 71 year old female with a past medical history of paroxysmal A-fib on Eliquis, hypothyroidism, major depressive disorder, venous insufficiency and chronic diastolic heart failure that presents to the clinic for a 1 month history of nonhealing wound to the left lower extremity. She visited the ED on 10/18/2021 after a mechanical fall. She developed a hematoma that subsequently opened. She was hospitalized for 7 days and discharged on 10/25/2021. She has been on several different antibiotics For the past month. She states that most recently she was on Levaquin and linezolid for the past week. She states she completes her antibiotic course tomorrow. She has been using Dakin's wet-to-dry dressings to the wound bed. She denies signs of infection. 4/7; patient presents for follow-up. She has been using Dakin's  wet-to-dry dressings. She did end up going to the ED on 4/1 because she had excess bleeding with dressing change that she could not stop. She is on Eliquis for A-fib. In the ED they tied off a small artery. She has had no issues since discharge. She denies signs of infection. 4/14; this is a very difficult clinical situation. A patient with underlying chronic venous insufficiency and lymphedema very significant lower extremity edema had a hematoma after a fall on her left upper lateral lower leg. She is on Eliquis for atrial fibrillation apparently with a history of a splenic infarct following with Dr. Jolyn Nap of cardiology. She has exhibited significant bleeding from the wound surface including ao Venous bleeder that required suturing short while ago. She has been using Dakin's wet-to-dry packing and over the surface of the wound. She saw Dr. Caryl Comes yesterday he is reluctant to consider stopping the Eliquis because of the prior history of presumed cardioembolism. Wants to communicate with Dr. Heber [place] when she returns. In a perfect world where she was not on Eliquis she requires a wound VAC with additional compression wraps but I understand the reluctance to do this because of the concerns of bleeding 4/28; the patient's wound actually looks better today using Dakin's wet-to-dry that she is changing twice a day she is wrapping this with Kerlix and Ace wrapping. She tells me she had 2 small bleeding areas which were part of the superficial wound that stopped this week with direct pressure. Other than that no major issues. In follow-up from discussion of last week Dr. 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 The bigger question from my point of view is the wound VAC issue. As far as she knows and her daughter-in-law verifies that she  has not had any bleeding from the deeper parts of the wound although the bleeding has been superficial including the one that sent her to the ER for stitches. She is concerned that a wound VAC would cause further bleeding and I cannot completely allay those concerns. 5/8; patient presents for follow-up. She has no issues or complaints today. She has been using Dakin's wet-to-dry dressings without issues. She denies signs of infection. 5/12; patient presents for follow-up. She continues to use Dakin's wet-to-dry dressings without issues. She denies signs of infection. She reports some issues with bleeding at times but this has improved. She denies  signs of infection. 6/1; patient presents for follow-up. She was recently hospitalized for upper left leg thigh cellulitis. She was given IV cefepime and vancomycin and discharged on oral antibiotics. She has been using Dakin's wet-to-dry dressings to the left lower leg wound. She reports improvement in healing. She currently denies systemic signs of infection. Patient History Medical History Cardiovascular Patient has history of Congestive Heart Failure, Hypertension Hospitalization/Surgery History - Cellulitis left leg- 01/03/2022-01/07/2022. Medical A Surgical History Notes nd Constitutional Symptoms (General Health) Infarction of spleen Hematologic/Lymphatic Hypothyroidism Cardiovascular A-Fib Gastrointestinal Gastroesophageal reflux Genitourinary Chronic kidney disease Musculoskeletal Osteoarthritis of knee Psychiatric Anxiety Objective Constitutional respirations regular, non-labored and within target range for patient.. Vitals Time Taken: 2:10 PM, Height: 67 in, Weight: 340 lbs, BMI: 53.2, Temperature: 98.5 F, Pulse: 59 bpm, Respiratory Rate: 20 breaths/min, Blood Pressure: 145/78 mmHg. Cardiovascular 2+ dorsalis pedis/posterior tibialis pulses. Psychiatric pleasant and cooperative. General Notes: Left lower extremity: T the  lateral aspect there is an open wound with hyper granulated tissue at the surface and slough to the depth of the o wound. Integumentary (Hair, Skin) Wound #1 status is Open. Original cause of wound was Trauma. The date acquired was: 10/18/2021. The wound has been in treatment 8 weeks. The wound is located on the Left,Lateral Lower Leg. The wound measures 5.8cm length x 9cm width x 3.2cm depth; 40.998cm^2 area and 131.193cm^3 volume. There is muscle and Fat Layer (Subcutaneous Tissue) exposed. There is no tunneling noted, however, there is undermining starting at 12:00 and ending at 12:00 with a maximum distance of 3.7cm. There is a medium amount of serosanguineous drainage noted. The wound margin is flat and intact. There is large (67-100%) red, friable granulation within the wound bed. There is a small (1-33%) amount of necrotic tissue within the wound bed including Adherent Slough. Assessment Active Problems ICD-10 Non-pressure chronic ulcer of other part of left lower leg with fat layer exposed Other early complications of trauma, initial encounter Paroxysmal atrial fibrillation Long term (current) use of anticoagulants Patient's wound has shown improvement in size and appearance since last clinic visit. I debrided nonviable tissue. I used silver nitrate to the hyper granulated areas. I recommended continuing Dakin's wet-to-dry dressings. Procedures Wound #1 Pre-procedure diagnosis of Wound #1 is a Trauma, Other located on the Left,Lateral Lower Leg . There was a Excisional Skin/Subcutaneous Tissue Debridement with a total area of 17.4 sq cm performed by Kalman Shan, DO. With the following instrument(s): Curette to remove Viable and Non-Viable tissue/material. Material removed includes Subcutaneous Tissue, Slough, Skin: Dermis, Skin: Epidermis, and Fibrin/Exudate after achieving pain control using Other (benzocaine 20%). A time out was conducted at 14:35, prior to the start of the  procedure. A Minimum amount of bleeding was controlled with Silver Nitrate. The procedure was tolerated well with a pain level of 0 throughout and a pain level of 0 following the procedure. Post Debridement Measurements: 5.8cm length x 9cm width x 3.2cm depth; 131.193cm^3 volume. Character of Wound/Ulcer Post Debridement is improved. Post procedure Diagnosis Wound #1: Same as Pre-Procedure Plan Follow-up Appointments: Return Appointment in 1 week. - Dr. Heber [place] and [place], Room 8 Wednesday 130pm 01/26/2022 Dr. Heber [place] and [place], Room 8 Thursday 130pm 02/03/2022 Bathing/ Shower/ Hygiene: May shower with protection but do not get wound dressing(s) wet. Edema Control - Lymphedema / SCD / Other: Elevate legs to the level of the heart or above for 30 minutes daily and/or when sitting, a frequency of: - 3-4 times a day throughout the day. Avoid standing for  long periods of time. Home Health: No change in wound care orders this week; continue Home Health for wound care. May utilize formulary equivalent dressing for wound treatment orders unless otherwise specified. - 2x a week dressing changes and all other days family member to change. Other Home Health Orders/Instructions: - [facility] WOUND #1: - Lower Leg Wound Laterality: Left, Lateral Cleanser: Wound Cleanser (Home Health) 2 x Per Day/30 Days Discharge Instructions: Cleanse the wound with wound cleanser prior to applying a clean dressing using gauze sponges, not tissue or cotton balls. Prim Dressing: Plain packing strip 1/2 (in) 2 x Per Day/30 Days ary Discharge Instructions: ****Or Conforming Stretch Gauze Bandage, Sterile 2x75 (in/in)**** Lightly pack as instructed. May use 1" or conform gauze Prim Dressing: Dakin's Solution 0.25%, 16 (oz) (Home Health) 2 x Per Day/30 Days ary Discharge Instructions: Moisten gauze with Dakin's solution Secondary Dressing: ABD Pad, 8x10 (Home Health) 2 x Per Day/30 Days Discharge Instructions: Apply over  primary dressing as directed. Secondary Dressing: Woven Gauze Sponge, Non-Sterile 4x4 in (Home Health) 2 x Per Day/30 Days Discharge Instructions: Apply over primary dressing as directed. Secondary Dressing: Zetuvit Plus 4x8 in [facility]) 2 x Per Day/30 Days Discharge Instructions: Apply over primary dressing as directed. Secured With: Elastic Bandage 4 inch (ACE bandage) (Home Health) 2 x Per Day/30 Days Discharge Instructions: Secure with ACE bandage as directed. Secured With: [employer], 4.5x3.1 (in/yd) (Home Health) 2 x Per Day/30 Days Discharge Instructions: Secure with Kerlix as directed. Secured With: 9M Medipore H Soft Cloth Surgical T ape, 4 x 10 (in/yd) (Home Health) 2 x Per Day/30 Days Discharge Instructions: Secure with tape as directed. 1. In office sharp debridement 2. Silver nitrate 3. Dakin's wet-to-dry dressings 4. Follow-up in 1 week Electronic Signature(s) Signed: 01/20/2022 4:24:05 PM By: Geralyn Corwin DO Entered By: Geralyn Corwin on 01/20/2022 16:23:36 -------------------------------------------------------------------------------- HxROS Details Patient Name: Date of Service: [name], Christine [name] 01/20/2022 2:15 PM Medical Record Number: [id number] Patient Account Number: [id number] Date of Birth/Sex: Treating RN: 08/15/1951 (71 y.o. [name] Primary Care Provider: [name] Other Clinician: Referring Provider: Treating Provider/Extender: Annamary Rummage in Treatment: 8 Constitutional Symptoms (General Health) Medical History: Past Medical History Notes: Infarction of spleen Hematologic/Lymphatic Medical History: Past Medical History Notes: Hypothyroidism Cardiovascular Medical History: Positive for: Congestive Heart Failure; Hypertension Past Medical History Notes: A-Fib Gastrointestinal Medical History: Past Medical History Notes: Gastroesophageal reflux Genitourinary Medical History: Past Medical  History Notes: Chronic kidney disease Musculoskeletal Medical History: Past Medical History Notes: Osteoarthritis of knee Psychiatric Medical History: Past Medical History Notes: Anxiety Immunizations Pneumococcal Vaccine: Received Pneumococcal Vaccination: No Implantable Devices No devices added Hospitalization / Surgery History Type of Hospitalization/Surgery Cellulitis left leg- 01/03/2022-01/07/2022 Electronic Signature(s) Signed: 01/20/2022 4:24:05 PM By: Geralyn Corwin DO Signed: 01/20/2022 4:48:29 PM By: Shawn Stall RN, BSN Entered By: Geralyn Corwin on 01/20/2022 16:21:33 -------------------------------------------------------------------------------- SuperBill Details Patient Name: Date of Service: Clois Dupes J. 01/20/2022 Medical Record Number: [id number] Patient Account Number: [id number] Date of Birth/Sex: Treating RN: 1950-12-15 (71 y.o. [name] Primary Care Provider: [name] Other Clinician: Referring Provider: Treating Provider/Extender: Annamary Rummage in Treatment: 8 Diagnosis Coding ICD-10 Codes Code Description [phone] Non-pressure chronic ulcer of other part of left lower leg with fat layer exposed T79.8XXA Other early complications of trauma, initial encounter I48.0 Paroxysmal atrial fibrillation Z79.01 Long term (current) use of anticoagulants Facility Procedures Physician Procedures : CPT4 Code Description Modifier [phone] -  WC PHYS SUBQ TISS 20 SQ CM ICD-10 Diagnosis Description L97.822 Non-pressure chronic ulcer of other part of left lower leg with fat layer exposed Quantity: 1 Electronic Signature(s) Signed: 01/20/2022 4:24:05 PM By: Kalman Shan DO Entered By: Kalman Shan on 01/20/2022 16:23:47

## 2022-01-21 ENCOUNTER — Encounter (HOSPITAL_BASED_OUTPATIENT_CLINIC_OR_DEPARTMENT_OTHER): Payer: PPO | Admitting: General Surgery

## 2022-01-24 DIAGNOSIS — L97922 Non-pressure chronic ulcer of unspecified part of left lower leg with fat layer exposed: Secondary | ICD-10-CM | POA: Diagnosis not present

## 2022-01-24 DIAGNOSIS — I4819 Other persistent atrial fibrillation: Secondary | ICD-10-CM | POA: Diagnosis not present

## 2022-01-24 DIAGNOSIS — I110 Hypertensive heart disease with heart failure: Secondary | ICD-10-CM | POA: Diagnosis not present

## 2022-01-24 DIAGNOSIS — I5030 Unspecified diastolic (congestive) heart failure: Secondary | ICD-10-CM | POA: Diagnosis not present

## 2022-01-24 DIAGNOSIS — F17200 Nicotine dependence, unspecified, uncomplicated: Secondary | ICD-10-CM | POA: Diagnosis not present

## 2022-01-24 DIAGNOSIS — F3342 Major depressive disorder, recurrent, in full remission: Secondary | ICD-10-CM | POA: Diagnosis not present

## 2022-01-24 DIAGNOSIS — D692 Other nonthrombocytopenic purpura: Secondary | ICD-10-CM | POA: Diagnosis not present

## 2022-01-24 DIAGNOSIS — Z515 Encounter for palliative care: Secondary | ICD-10-CM | POA: Diagnosis not present

## 2022-01-24 DIAGNOSIS — Z6841 Body Mass Index (BMI) 40.0 and over, adult: Secondary | ICD-10-CM | POA: Diagnosis not present

## 2022-01-26 ENCOUNTER — Encounter (HOSPITAL_BASED_OUTPATIENT_CLINIC_OR_DEPARTMENT_OTHER): Payer: PPO | Admitting: Internal Medicine

## 2022-01-26 DIAGNOSIS — L97822 Non-pressure chronic ulcer of other part of left lower leg with fat layer exposed: Secondary | ICD-10-CM | POA: Diagnosis not present

## 2022-01-26 NOTE — Progress Notes (Signed)
[name] ([id number]) Visit Report for 01/26/2022 HPI Details Patient Name: Date of Service: [name] 01/26/2022 1:30 PM Medical Record Number: [id number] Patient Account Number: [id number] Date of Birth/Sex: Treating RN: Jan 24, 1951 (71 y.o. [name] Primary Care Provider: Deland Pretty Other Clinician: Referring Provider: Treating Provider/Extender: Ronni Rumble in Treatment: 9 History of Present Illness HPI Description: Admission 11/19/2021 [name] is a 71 year old female with a past medical history of paroxysmal A-fib on Eliquis, hypothyroidism, major depressive disorder, venous insufficiency and chronic diastolic heart failure that presents to the clinic for a 1 month history of nonhealing wound to the left lower extremity. She visited the ED on 10/18/2021 after a mechanical fall. She developed a hematoma that subsequently opened. She was hospitalized for 7 days and discharged on 10/25/2021. She has been on several different antibiotics For the past month. She states that most recently she was on Levaquin and linezolid for the past week. She states she completes her antibiotic course tomorrow. She has been using Dakin's wet-to-dry dressings to the wound bed. She denies signs of infection. 4/7; patient presents for follow-up. She has been using Dakin's wet-to-dry dressings. She did end up going to the ED on 4/1 because she had excess bleeding with dressing change that she could not stop. She is on Eliquis for A-fib. In the ED they tied off a small artery. She has had no issues since discharge. She denies signs of infection. 4/14; this is a very difficult clinical situation. A patient with underlying chronic venous insufficiency and lymphedema very significant lower extremity edema had a hematoma after a fall on her left upper lateral lower leg. She is on Eliquis for atrial fibrillation apparently with a history of a splenic infarct  following with Dr. Jolyn Nap of cardiology. She has exhibited significant bleeding from the wound surface including ao Venous bleeder that required suturing short while ago. She has been using Dakin's wet-to-dry packing and over the surface of the wound. She saw Dr. Caryl Comes yesterday he is reluctant to consider stopping the Eliquis because of the prior history of presumed cardioembolism. Wants to communicate with Dr. Heber [place] when she returns. In a perfect world where she was not on Eliquis she requires a wound VAC with additional compression wraps but I understand the reluctance to do this because of the concerns of bleeding 4/28; the patient's wound actually looks better today using Dakin's wet-to-dry that she is changing twice a day she is wrapping this with Kerlix and Ace wrapping. She tells me she had 2 small bleeding areas which were part of the superficial wound that stopped this week with direct pressure. Other than that no major issues. In follow-up from discussion of last week Dr. 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 The bigger question from my point of view is the wound VAC issue. As far as she knows and her daughter-in-law verifies that she has not had any bleeding from the deeper parts of the wound although the bleeding has been superficial including the one that sent her to the ER for stitches. She is concerned that a wound VAC would cause further bleeding and I cannot completely allay those concerns. 5/8; patient presents for follow-up. She has no issues or complaints today. She has been using Dakin's wet-to-dry dressings without  issues. She denies signs of infection. 5/12; patient presents for follow-up. She continues to use Dakin's wet-to-dry dressings without issues. She denies signs of infection. She reports some  issues with bleeding at times but this has improved. She denies signs of infection. 6/1; patient presents for follow-up. She was recently hospitalized for upper left leg thigh cellulitis. She was given IV cefepime and vancomycin and discharged on oral antibiotics. She has been using Dakin's wet-to-dry dressings to the left lower leg wound. She reports improvement in healing. She currently denies systemic signs of infection. 6/7; patient is using Dakin's wet-to-dry twice daily. In general this looks better than when I saw this a month or so ago however still considerable depth to the tunnel in her left leg. She is going for iron infusions ordered by her primary care doctor I believe Electronic Signature(s) Signed: 01/26/2022 5:09:19 PM By: Linton Ham MD Entered By: Linton Ham on 01/26/2022 14:07:25 -------------------------------------------------------------------------------- Physical Exam Details Patient Name: Date of Service: Christine Debar [name] 01/26/2022 1:30 PM Medical Record Number: [id number] Patient Account Number: [id number] Date of Birth/Sex: Treating RN: 1950/12/19 (71 y.o. [name] Primary Care Provider: Deland Pretty Other Clinician: Referring Provider: Treating Provider/Extender: Ronni Rumble in Treatment: 9 Constitutional Sitting or standing Blood Pressure is within target range for patient.. Pulse regular and within target range for patient.Marland Kitchen Respirations regular, non-labored and within target range.. Temperature is normal and within the target range for the patient.Marland Kitchen Appears in no distress. Notes Wound exam; left lower extremity. Under illumination that tunnel certainly has some surface slough however generally looks better than what I remember. However the tunnel remains substantial there is no evidence of surrounding infection or current cellulitis Electronic Signature(s) Signed: 01/26/2022 5:09:19 PM By: Linton Ham MD Entered  By: Linton Ham on 01/26/2022 14:08:11 -------------------------------------------------------------------------------- Physician Orders Details Patient Name: Date of Service: Christine Debar [name] 01/26/2022 1:30 PM Medical Record Number: [id number] Patient Account Number: [id number] Date of Birth/Sex: Treating RN: 04-09-1951 (71 y.o. Nancy Fetter Primary Care Provider: Deland Pretty Other Clinician: Referring Provider: Treating Provider/Extender: Ronni Rumble in Treatment: 9 Verbal / Phone Orders: No Diagnosis Coding ICD-10 Coding Code Description [phone] Non-pressure chronic ulcer of other part of left lower leg with fat layer exposed T79.8XXA Other early complications of trauma, initial encounter I48.0 Paroxysmal atrial fibrillation Z79.01 Long term (current) use of anticoagulants Follow-up Appointments ppointment in 1 week. - Dr. Heber [place] and [facility]ammi [place], Room 8 Thursday 130pm 02/03/2022 Return A Bathing/ Shower/ Hygiene May shower with protection but do not get wound dressing(s) wet. Edema Control - Lymphedema / SCD / Other Elevate legs to the level of the heart or above for 30 minutes daily and/or when sitting, a frequency of: - 3-4 times a day throughout the day. Avoid standing for long periods of time. Home Health No change in wound care orders this week; continue Home Health for wound care. May utilize formulary equivalent dressing for wound treatment orders unless otherwise specified. - 2x a week dressing changes and all other days family member to change. Other Home Health Orders/Instructions: - [facility] Wound Treatment Wound #1 - Lower Leg Wound Laterality: Left, Lateral Cleanser: Wound Cleanser (Home Health) 2 x Per Day/30 Days Discharge Instructions: Cleanse the wound with wound cleanser prior to applying a clean dressing using gauze sponges, not tissue or cotton balls. Prim Dressing: Plain packing strip 1/2 (in) 2 x Per Day/30  Days ary Discharge Instructions: ****Or Conforming Stretch Gauze  Bandage, Sterile 2x75 (in/in)**** Lightly pack as instructed. May use 1" or conform gauze Prim Dressing: Dakin's Solution 0.25%, 16 (oz) (Home Health) 2 x Per Day/30 Days ary Discharge Instructions: Moisten gauze with Dakin's solution Secondary Dressing: ABD Pad, 8x10 (Home Health) 2 x Per Day/30 Days Discharge Instructions: Apply over primary dressing as directed. Secondary Dressing: Woven Gauze Sponge, Non-Sterile 4x4 in (Home Health) 2 x Per Day/30 Days Discharge Instructions: Apply over primary dressing as directed. Secondary Dressing: Zetuvit Plus 4x8 in [facility]) 2 x Per Day/30 Days Discharge Instructions: Apply over primary dressing as directed. Secured With: Elastic Bandage 4 inch (ACE bandage) (Home Health) 2 x Per Day/30 Days Discharge Instructions: Secure with ACE bandage as directed. Secured With: [employer], 4.5x3.1 (in/yd) (Home Health) 2 x Per Day/30 Days Discharge Instructions: Secure with Kerlix as directed. Secured With: 51M Medipore H Soft Cloth Surgical T ape, 4 x 10 (in/yd) (Home Health) 2 x Per Day/30 Days Discharge Instructions: Secure with tape as directed. Electronic Signature(s) Signed: 01/26/2022 5:09:19 PM By: Linton Ham MD Signed: 01/26/2022 5:22:20 PM By: Levan Hurst RN, BSN Entered By: Levan Hurst on 01/26/2022 13:54:53 -------------------------------------------------------------------------------- Problem List Details Patient Name: Date of Service: Link Snuffer, Erick Blinks [name] 01/26/2022 1:30 PM Medical Record Number: [id number] Patient Account Number: [id number] Date of Birth/Sex: Treating RN: 1950/09/28 (71 y.o. Nancy Fetter Primary Care Provider: Deland Pretty Other Clinician: Referring Provider: Treating Provider/Extender: Ronni Rumble in Treatment: 9 Active Problems ICD-10 Encounter Code Description Active Date MDM Diagnosis [phone]  Non-pressure chronic ulcer of other part of left lower leg with fat layer exposed3/31/2023 No Yes T79.8XXA Other early complications of trauma, initial encounter 11/19/2021 No Yes I48.0 Paroxysmal atrial fibrillation 11/19/2021 No Yes Z79.01 Long term (current) use of anticoagulants 11/19/2021 No Yes Inactive Problems Resolved Problems Electronic Signature(s) Signed: 01/26/2022 5:09:19 PM By: Linton Ham MD Entered By: Linton Ham on 01/26/2022 14:06:06 -------------------------------------------------------------------------------- Progress Note Details Patient Name: Date of Service: Christine Debar [name] 01/26/2022 1:30 PM Medical Record Number: [id number] Patient Account Number: [id number] Date of Birth/Sex: Treating RN: 06-08-1951 (71 y.o. [name] Primary Care Provider: Deland Pretty Other Clinician: Referring Provider: Treating Provider/Extender: Ronni Rumble in Treatment: 9 Subjective History of Present Illness (HPI) Admission 11/19/2021 Ms. Malu Polivka is a 71 year old female with a past medical history of paroxysmal A-fib on Eliquis, hypothyroidism, major depressive disorder, venous insufficiency and chronic diastolic heart failure that presents to the clinic for a 1 month history of nonhealing wound to the left lower extremity. She visited the ED on 10/18/2021 after a mechanical fall. She developed a hematoma that subsequently opened. She was hospitalized for 7 days and discharged on 10/25/2021. She has been on several different antibiotics For the past month. She states that most recently she was on Levaquin and linezolid for the past week. She states she completes her antibiotic course tomorrow. She has been using Dakin's wet-to-dry dressings to the wound bed. She denies signs of infection. 4/7; patient presents for follow-up. She has been using Dakin's wet-to-dry dressings. She did end up going to the ED on 4/1 because she had excess  bleeding with dressing change that she could not stop. She is on Eliquis for A-fib. In the ED they tied off a small artery. She has had no issues since discharge. She denies signs of infection. 4/14; this is a very difficult clinical situation. A patient with underlying chronic venous insufficiency and lymphedema very significant lower extremity  edema had a hematoma after a fall on her left upper lateral lower leg. She is on Eliquis for atrial fibrillation apparently with a history of a splenic infarct following with Dr. Jolyn Nap of cardiology. She has exhibited significant bleeding from the wound surface including ao Venous bleeder that required suturing short while ago. She has been using Dakin's wet-to-dry packing and over the surface of the wound. She saw Dr. Caryl Comes yesterday he is reluctant to consider stopping the Eliquis because of the prior history of presumed cardioembolism. Wants to communicate with Dr. Heber [place] when she returns. In a perfect world where she was not on Eliquis she requires a wound VAC with additional compression wraps but I understand the reluctance to do this because of the concerns of bleeding 4/28; the patient's wound actually looks better today using Dakin's wet-to-dry that she is changing twice a day she is wrapping this with Kerlix and Ace wrapping. She tells me she had 2 small bleeding areas which were part of the superficial wound that stopped this week with direct pressure. Other than that no major issues. In follow-up from discussion of last week Dr. 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 The bigger question from my point of view is the wound VAC issue. As far as she knows and her daughter-in-law verifies that she has not had any bleeding from the deeper parts of the wound although the bleeding  has been superficial including the one that sent her to the ER for stitches. She is concerned that a wound VAC would cause further bleeding and I cannot completely allay those concerns. 5/8; patient presents for follow-up. She has no issues or complaints today. She has been using Dakin's wet-to-dry dressings without issues. She denies signs of infection. 5/12; patient presents for follow-up. She continues to use Dakin's wet-to-dry dressings without issues. She denies signs of infection. She reports some issues with bleeding at times but this has improved. She denies signs of infection. 6/1; patient presents for follow-up. She was recently hospitalized for upper left leg thigh cellulitis. She was given IV cefepime and vancomycin and discharged on oral antibiotics. She has been using Dakin's wet-to-dry dressings to the left lower leg wound. She reports improvement in healing. She currently denies systemic signs of infection. 6/7; patient is using Dakin's wet-to-dry twice daily. In general this looks better than when I saw this a month or so ago however still considerable depth to the tunnel in her left leg. She is going for iron infusions ordered by her primary care doctor I believe Objective Constitutional Sitting or standing Blood Pressure is within target range for patient.. Pulse regular and within target range for patient.Marland Kitchen Respirations regular, non-labored and within target range.. Temperature is normal and within the target range for the patient.Marland Kitchen Appears in no distress. Vitals Time Taken: 1:28 PM, Height: 67 in, Weight: 340 lbs, BMI: 53.2, Temperature: 97.9 F, Pulse: 52 bpm, Respiratory Rate: 18 breaths/min, Blood Pressure: 157/60 mmHg. General Notes: Wound exam; left lower extremity. Under illumination that tunnel certainly has some surface slough however generally looks better than what I remember. However the tunnel remains substantial there is no evidence of surrounding infection or  current cellulitis Integumentary (Hair, Skin) Wound #1 status is Open. Original cause of wound was Trauma. The date acquired was: 10/18/2021. The wound has been  in treatment 9 weeks. The wound is located on the Left,Lateral Lower Leg. The wound measures 5.9cm length x 8.8cm width x 2.8cm depth; 40.778cm^2 area and 114.178cm^3 volume. There is muscle and Fat Layer (Subcutaneous Tissue) exposed. There is no tunneling noted, however, there is undermining starting at 10:00 and ending at 12:00 with a maximum distance of 7.5cm. There is a medium amount of serosanguineous drainage noted. The wound margin is flat and intact. There is large (67-100%) red, friable granulation within the wound bed. There is a small (1-33%) amount of necrotic tissue within the wound bed including Adherent Slough. Assessment Active Problems ICD-10 Non-pressure chronic ulcer of other part of left lower leg with fat layer exposed Other early complications of trauma, initial encounter Paroxysmal atrial fibrillation Long term (current) use of anticoagulants Plan Follow-up Appointments: Return Appointment in 1 week. - Dr. Heber [place] and [place], Room 8 Thursday 130pm 02/03/2022 Bathing/ Shower/ Hygiene: May shower with protection but do not get wound dressing(s) wet. Edema Control - Lymphedema / SCD / Other: Elevate legs to the level of the heart or above for 30 minutes daily and/or when sitting, a frequency of: - 3-4 times a day throughout the day. Avoid standing for long periods of time. Home Health: No change in wound care orders this week; continue Home Health for wound care. May utilize formulary equivalent dressing for wound treatment orders unless otherwise specified. - 2x a week dressing changes and all other days family member to change. Other Home Health Orders/Instructions: - [facility] WOUND #1: - Lower Leg Wound Laterality: Left, Lateral Cleanser: Wound Cleanser (Home Health) 2 x Per Day/30 Days Discharge  Instructions: Cleanse the wound with wound cleanser prior to applying a clean dressing using gauze sponges, not tissue or cotton balls. Prim Dressing: Plain packing strip 1/2 (in) 2 x Per Day/30 Days ary Discharge Instructions: ****Or Conforming Stretch Gauze Bandage, Sterile 2x75 (in/in)**** Lightly pack as instructed. May use 1" or conform gauze Prim Dressing: Dakin's Solution 0.25%, 16 (oz) (Home Health) 2 x Per Day/30 Days ary Discharge Instructions: Moisten gauze with Dakin's solution Secondary Dressing: ABD Pad, 8x10 (Home Health) 2 x Per Day/30 Days Discharge Instructions: Apply over primary dressing as directed. Secondary Dressing: Woven Gauze Sponge, Non-Sterile 4x4 in (Home Health) 2 x Per Day/30 Days Discharge Instructions: Apply over primary dressing as directed. Secondary Dressing: Zetuvit Plus 4x8 in [facility]) 2 x Per Day/30 Days Discharge Instructions: Apply over primary dressing as directed. Secured With: Elastic Bandage 4 inch (ACE bandage) (Home Health) 2 x Per Day/30 Days Discharge Instructions: Secure with ACE bandage as directed. Secured With: [employer], 4.5x3.1 (in/yd) (Home Health) 2 x Per Day/30 Days Discharge Instructions: Secure with Kerlix as directed. Secured With: 41M Medipore H Soft Cloth Surgical T ape, 4 x 10 (in/yd) (Home Health) 2 x Per Day/30 Days Discharge Instructions: Secure with tape as directed. 1. I still think the issue here would be as whether or not she could tolerate a wound VAC to be of either the tissue friability and bleeding. 2. In my sense I think the wound VAC would be worth a try we could apply it here monitor her for 2 or 3 hours. I do not think there is a horrible outcome to this attempt. Its possible she would develop some subcutaneous bleeding in the tunnel that we would not be able to cauterize however I do not think this negates the potential benefits if the patient is willing to proceed 3. No evidence of  current  infection Electronic Signature(s) Signed: 01/26/2022 5:09:19 PM By: Linton Ham MD Entered By: Linton Ham on 01/26/2022 14:10:05 -------------------------------------------------------------------------------- SuperBill Details Patient Name: Date of Service: [name] 01/26/2022 Medical Record Number: [id number] Patient Account Number: [id number] Date of Birth/Sex: Treating RN: 1951-04-15 (71 y.o. [name] Primary Care Provider: Deland Pretty Other Clinician: Referring Provider: Treating Provider/Extender: Ronni Rumble in Treatment: 9 Diagnosis Coding ICD-10 Codes Code Description [phone] Non-pressure chronic ulcer of other part of left lower leg with fat layer exposed T79.8XXA Other early complications of trauma, initial encounter I48.0 Paroxysmal atrial fibrillation Z79.01 Long term (current) use of anticoagulants Facility Procedures CPT4 Code: [id number] Description: 99213 - WOUND CARE VISIT-LEV 3 EST PT Modifier: Quantity: 1 Physician Procedures : CPT4 Code Description Modifier [id number] - WC PHYS LEVEL 4 - EST PT ICD-10 Diagnosis Description L97.822 Non-pressure chronic ulcer of other part of left lower leg with fat layer exposed T79.8XXA Other early complications of trauma, initial  encounter I48.0 Paroxysmal atrial fibrillation Z79.01 Long term (current) use of anticoagulants Quantity: 1 Electronic Signature(s) Signed: 01/26/2022 5:09:19 PM By: Linton Ham MD Signed: 01/26/2022 5:22:20 PM By: Levan Hurst RN, BSN Entered By: Levan Hurst on 01/26/2022 16:45:04

## 2022-02-01 NOTE — Progress Notes (Signed)
KEYUNA, CUTHRELL ([id number]) Visit Report for 12/17/2021 Arrival Information Details Patient Name: Date of Service: [name] 12/17/2021 12:30 PM Medical Record Number: [id number] Patient Account Number: [id number] Date of Birth/Sex: Treating RN: March 29, 1951 (71 y.o. [name] Primary Care Emberlie Gotcher: Deland Pretty Other Clinician: Referring Rennee Coyne: Treating Jacia Sickman/Extender: Ronni Rumble in Treatment: 4 Visit Information History Since Last Visit Added or deleted any medications: No Patient Arrived: Wheel Chair Any new allergies or adverse reactions: No Arrival Time: 12:51 Had a fall or experienced change in No Accompanied By: daughter in law activities of daily living that may affect Transfer Assistance: None risk of falls: Patient Identification Verified: Yes Signs or symptoms of abuse/neglect since last visito No Secondary Verification Process Completed: Yes Hospitalized since last visit: No Patient Requires Transmission-Based Precautions: No Implantable device outside of the clinic excluding No Patient Has Alerts: Yes cellular tissue based products placed in the center Patient Alerts: Patient on Blood Thinner since last visit: Has Dressing in Place as Prescribed: Yes Has Compression in Place as Prescribed: Yes Pain Present Now: No Electronic Signature(s) Signed: 12/28/2021 8:25:23 AM By: Sharyn Creamer RN, BSN Entered By: Sharyn Creamer on 12/17/2021 12:52:18 -------------------------------------------------------------------------------- Encounter Discharge Information Details Patient Name: Date of Service: Christine Simmonds J. 12/17/2021 12:30 PM Medical Record Number: [id number] Patient Account Number: [id number] Date of Birth/Sex: Treating RN: 06/14/1951 (71 y.o. [name] Primary Care Yalanda Soderman: Deland Pretty Other Clinician: Referring Jacee Enerson: Treating [place] Talcott/Extender: Ronni Rumble in  Treatment: 4 Encounter Discharge Information Items Post Procedure Vitals Discharge Condition: Stable Temperature (F): 98.2 Ambulatory Status: Wheelchair Pulse (bpm): 58 Discharge Destination: Home Respiratory Rate (breaths/min): 19 Transportation: Private Auto Blood Pressure (mmHg): 138/80 Accompanied By: daughter in law Schedule Follow-up Appointment: Yes Clinical Summary of Care: Patient Declined Electronic Signature(s) Signed: 12/28/2021 8:25:23 AM By: Sharyn Creamer RN, BSN Entered By: Sharyn Creamer on 12/17/2021 13:49:31 -------------------------------------------------------------------------------- Lower Extremity Assessment Details Patient Name: Date of Service: Christine Simmonds J. 12/17/2021 12:30 PM Medical Record Number: [id number] Patient Account Number: [id number] Date of Birth/Sex: Treating RN: May 12, 1951 (71 y.o. [name] Primary Care Rowen Hur: Deland Pretty Other Clinician: Referring Myrl Lazarus: Treating Leyland Kenna/Extender: Ronni Rumble in Treatment: 4 Edema Assessment Assessed: Shirlyn Goltz: No] Patrice Paradise: No] Edema: [Left: Ye] [Right: s] Calf Left: Right: Point of Measurement: 31 cm From Medial Instep 53 cm Ankle Left: Right: Point of Measurement: 9 cm From Medial Instep 26 cm Vascular Assessment Pulses: Dorsalis Pedis Palpable: [Left:Yes] Electronic Signature(s) Signed: 12/28/2021 8:25:23 AM By: Sharyn Creamer RN, BSN Entered By: Sharyn Creamer on 12/17/2021 13:00:26 -------------------------------------------------------------------------------- Multi Wound Chart Details Patient Name: Date of Service: Christine Simmonds J. 12/17/2021 12:30 PM Medical Record Number: [id number] Patient Account Number: [id number] Date of Birth/Sex: Treating RN: Aug 19, 1951 (71 y.o. [name] Primary Care Arthur Speagle: Deland Pretty Other Clinician: Referring Tyquasia Pant: Treating Dominque Marlin/Extender: Ronni Rumble in  Treatment: 4 Vital Signs Height(in): 89 Pulse(bpm): 32 Weight(lbs): 340 Blood Pressure(mmHg): 138/80 Body Mass Index(BMI): 53.2 Temperature(F): 98.2 Respiratory Rate(breaths/min): 19 Photos: [1:No Photos] [N/A:N/A] Left, Lateral Lower Leg Left, Lateral Lower Leg N/A Wound Location: Trauma Trauma N/A Wounding Event: Trauma, Other Trauma, Other N/A Primary Etiology: Congestive Heart Failure, Congestive Heart Failure, N/A Comorbid History: Hypertension Hypertension 10/18/2021 10/18/2021 N/A Date Acquired: 4 4 N/A Weeks of Treatment: Open Open N/A Wound Status: No No N/A Wound Recurrence: Yes Yes N/A Clustered Wound: 2 N/A N/A Clustered Quantity: 11x11x5.7 11x11x5.7 N/A Measurements L  x W x D (cm) 95.033 95.033 N/A A (cm) : rea 541.689 541.689 N/A Volume (cm) : 50.90% 50.90% N/A % Reduction in A rea: 67.10% 67.10% N/A % Reduction in Volume: Full Thickness Without Exposed Full Thickness Without Exposed N/A Classification: Support Structures Support Structures Large Large N/A Exudate A mount: Serosanguineous Serosanguineous N/A Exudate Type: red, brown red, brown N/A Exudate Color: Distinct, outline attached N/A N/A Wound Margin: Large (67-100%) N/A N/A Granulation A mount: Red, Pink N/A N/A Granulation Quality: Small (1-33%) N/A N/A Necrotic A mount: Fat Layer (Subcutaneous Tissue): Yes N/A N/A Exposed Structures: Fascia: No Tendon: No Muscle: No Joint: No Bone: No Small (1-33%) N/A N/A Epithelialization: Debridement - Selective/Open Wound Debridement - Selective/Open Wound N/A Debridement: Pre-procedure Verification/Time Out 13:09 13:09 N/A Taken: Other Other N/A Pain Control: [facility] N/A Tissue Debrided: Non-Viable Tissue Non-Viable Tissue N/A Level: 4 4 N/A Debridement A (sq cm): rea Nippers Nippers N/A Instrument: Minimum Minimum N/A Bleeding: Pressure Pressure N/A Hemostasis A chieved: 0 0 N/A Procedural Pain: 0 0  N/A Post Procedural Pain: Procedure was tolerated well Procedure was tolerated well N/A Debridement Treatment Response: 11x11x5.7 11x11x5.7 N/A Post Debridement Measurements L x W x D (cm) 541.689 541.689 N/A Post Debridement Volume: (cm) Chemical Cauterization Chemical Cauterization N/A Procedures Performed: Debridement Debridement Treatment Notes Electronic Signature(s) Signed: 12/17/2021 4:01:11 PM By: Linton Ham MD Signed: 12/20/2021 5:13:39 PM By: Lorrin Jackson Entered By: Linton Ham on 12/17/2021 13:41:12 -------------------------------------------------------------------------------- Multi-Disciplinary Care Plan Details Patient Name: Date of Service: Christine Simmonds J. 12/17/2021 12:30 PM Medical Record Number: [id number] Patient Account Number: [id number] Date of Birth/Sex: Treating RN: November 03, 1950 (71 y.o. [name] Primary Care Zafar Debrosse: Deland Pretty Other Clinician: Referring Aide Wojnar: Treating Dianey Suchy/Extender: Ronni Rumble in Treatment: 4 Active Inactive Abuse / Safety / Falls / Self Care Management Nursing Diagnoses: History of Falls Potential for falls Goals: Patient/caregiver will verbalize/demonstrate measure taken to improve self care Date Initiated: 11/19/2021 Target Resolution Date: 01/14/2022 Goal Status: Active Patient/caregiver will verbalize/demonstrate measures taken to prevent injury and/or falls Date Initiated: 11/19/2021 Target Resolution Date: 01/07/2022 Goal Status: Active Interventions: Provide education on basic hygiene Provide education on fall prevention Provide education on HBO safety Provide education on vaccinations Notes: Necrotic Tissue Nursing Diagnoses: Impaired tissue integrity related to necrotic/devitalized tissue Goals: Necrotic/devitalized tissue will be minimized in the wound bed Date Initiated: 11/19/2021 Target Resolution Date: 01/07/2022 Goal Status:  Active Patient/caregiver will verbalize understanding of reason and process for debridement of necrotic tissue Date Initiated: 11/19/2021 Target Resolution Date: 01/07/2022 Goal Status: Active Interventions: Assess patient pain level pre-, during and post procedure and prior to discharge Provide education on necrotic tissue and debridement process Treatment Activities: Apply topical anesthetic as ordered : 11/19/2021 Excisional debridement : 11/19/2021 Notes: Orientation to the Wound Care Program Nursing Diagnoses: Knowledge deficit related to the wound healing center program Goals: Patient/caregiver will verbalize understanding of the [place] Program Date Initiated: 11/19/2021 Target Resolution Date: 01/07/2022 Goal Status: Active Interventions: Provide education on orientation to the wound center Notes: Pain, Acute or Chronic Nursing Diagnoses: Pain, acute or chronic: actual or potential Potential alteration in comfort, pain Goals: Patient will verbalize adequate pain control and receive pain control interventions during procedures as needed Date Initiated: 11/19/2021 Target Resolution Date: 01/07/2022 Goal Status: Active Patient/caregiver will verbalize comfort level met Date Initiated: 11/19/2021 Target Resolution Date: 01/07/2022 Goal Status: Active Interventions: Encourage patient to take pain medications as prescribed Provide education on pain management Reposition patient  for comfort Treatment Activities: Administer pain control measures as ordered : 11/19/2021 Notes: Electronic Signature(s) Signed: 12/28/2021 8:25:23 AM By: Sharyn Creamer RN, BSN Entered By: Sharyn Creamer on 12/17/2021 13:04:50 -------------------------------------------------------------------------------- Pain Assessment Details Patient Name: Date of Service: Christine Simmonds J. 12/17/2021 12:30 PM Medical Record Number: [id number] Patient Account Number: [id number] Date of  Birth/Sex: Treating RN: [birth year] (71 y.o. [name] Primary Care Bobak Oguinn: Deland Pretty Other Clinician: Referring Sherrey North: Treating Morganna Styles/Extender: Ronni Rumble in Treatment: 4 Active Problems Location of Pain Severity and Description of Pain Patient Has Paino No Site Locations Pain Management and Medication Current Pain Management: Electronic Signature(s) Signed: 12/28/2021 8:25:23 AM By: Sharyn Creamer RN, BSN Entered By: Sharyn Creamer on 12/17/2021 12:52:52 -------------------------------------------------------------------------------- Patient/Caregiver Education Details Patient Name: Date of Service: [name] 4/28/2023andnbsp12:30 PM Medical Record Number: [id number] Patient Account Number: [id number] Date of Birth/Gender: Treating RN: [birth year] (71 y.o. [name] Primary Care Physician: Deland Pretty Other Clinician: Referring Physician: Treating Physician/Extender: Ronni Rumble in Treatment: 4 Education Assessment Education Provided To: Patient and Caregiver Education Topics Provided Venous: Methods: Explain/Verbal Responses: State content correctly Wound Debridement: Methods: Explain/Verbal Responses: State content correctly Electronic Signature(s) Signed: 12/28/2021 8:25:23 AM By: Sharyn Creamer RN, BSN Entered By: Sharyn Creamer on 12/17/2021 13:27:03 -------------------------------------------------------------------------------- Wound Assessment Details Patient Name: Date of Service: Christine Simmonds J. 12/17/2021 12:30 PM Medical Record Number: [id number] Patient Account Number: [id number] Date of Birth/Sex: Treating RN: 20-Aug-1951 (71 y.o. [name] Primary Care Itza Maniaci: Deland Pretty Other Clinician: Referring Kip Cropp: Treating Najir Roop/Extender: Ronni Rumble in Treatment: 4 Wound Status Wound Number: 1 Primary Etiology: Trauma,  Other Wound Location: Left, Lateral Lower Leg Wound Status: Open Wounding Event: Trauma Comorbid History: Congestive Heart Failure, Hypertension Date Acquired: 10/18/2021 Weeks Of Treatment: 4 Clustered Wound: Yes Photos Wound Measurements Length: (cm) 11 Width: (cm) 11 Depth: (cm) 5.7 Area: (cm) 95.033 Volume: (cm) 541.689 % Reduction in Area: 50.9% % Reduction in Volume: 67.1% Epithelialization: Small (1-33%) Tunneling: Yes Location 1 Position (o'clock): 4 Maximum Distance: (cm) 9 Location 2 Position (o'clock): 7 Maximum Distance: (cm) 4 Undermining: No Wound Description Classification: Full Thickness Without Exposed Support Structures Wound Margin: Flat and Intact Exudate Amount: Large Exudate Type: Serosanguineous Exudate Color: red, brown Foul Odor After Cleansing: No Slough/Fibrino Yes Wound Bed Granulation Amount: Large (67-100%) Exposed Structure Granulation Quality: Red Fascia Exposed: No Necrotic Amount: Small (1-33%) Fat Layer (Subcutaneous Tissue) Exposed: Yes Necrotic Quality: Adherent Slough Tendon Exposed: No Muscle Exposed: No Joint Exposed: No Bone Exposed: No Electronic Signature(s) Signed: 12/28/2021 8:25:23 AM By: Sharyn Creamer RN, BSN Entered By: Sharyn Creamer on 12/17/2021 13:52:15 -------------------------------------------------------------------------------- Wound Assessment Details Patient Name: Date of Service: Christine Simmonds J. 12/17/2021 12:30 PM Medical Record Number: [id number] Patient Account Number: [id number] Date of Birth/Sex: Treating RN: 07-14-1951 (71 y.o. [name] Primary Care Yerik Zeringue: Deland Pretty Other Clinician: Referring Chadwick Reiswig: Treating Raylee Strehl/Extender: Ronni Rumble in Treatment: 4 Wound Status Wound Number: 1 Primary Etiology: Trauma, Other Wound Location: Left, Lateral Lower Leg Wound Status: Open Wounding Event: Trauma Comorbid History: Congestive Heart Failure,  Hypertension Date Acquired: 10/18/2021 Weeks Of Treatment: 4 Clustered Wound: Yes Photos Wound Measurements Length: (cm) 11 Width: (cm) 11 Depth: (cm) 5.7 Area: (cm) 95.033 Volume: (cm) 541.689 % Reduction in Area: 50.9% % Reduction in Volume: 67.1% Epithelialization: Small (1-33%) Tunneling: Yes Location 1 Position (o'clock): 4 Maximum Distance: (cm) 9 Location 2 Position (o'clock):  7 Maximum Distance: (cm) 4 Location 3 Position (o'clock): 2 Maximum Distance: (cm) 7 Undermining: No Wound Description Classification: Full Thickness Without Exposed Support Structures Wound Margin: Flat and Intact Exudate Amount: Large Exudate Type: Serosanguineous Exudate Color: red, brown Foul Odor After Cleansing: No Slough/Fibrino Yes Wound Bed Granulation Amount: Large (67-100%) Exposed Structure Granulation Quality: Red Fascia Exposed: No Necrotic Amount: Small (1-33%) Fat Layer (Subcutaneous Tissue) Exposed: Yes Necrotic Quality: Adherent Slough Tendon Exposed: No Muscle Exposed: No Joint Exposed: No Bone Exposed: No Electronic Signature(s) Signed: 12/28/2021 8:25:23 AM By: Sharyn Creamer RN, BSN Entered By: Sharyn Creamer on 12/17/2021 14:04:57 -------------------------------------------------------------------------------- Vitals Details Patient Name: Date of Service: Christine Debar [name] 12/17/2021 12:30 PM Medical Record Number: [id number] Patient Account Number: [id number] Date of Birth/Sex: Treating RN: [birth year] (71 y.o. [name] Primary Care Ghalia Reicks: Deland Pretty Other Clinician: Referring Chloris Marcoux: Treating Arrie Zuercher/Extender: Ronni Rumble in Treatment: 4 Vital Signs Time Taken: 12:52 Temperature (F): 98.2 Height (in): 67 Pulse (bpm): 58 Weight (lbs): 340 Respiratory Rate (breaths/min): 19 Body Mass Index (BMI): 53.2 Blood Pressure (mmHg): 138/80 Reference Range: 80 - 120 mg / dl Electronic Signature(s) Signed:  02/01/2022 8:47:56 AM By: Erenest Blank Entered By: Erenest Blank on 12/17/2021 12:52:51

## 2022-02-01 NOTE — Progress Notes (Signed)
Christine, [name] ([id number]) Visit Report for 12/30/2021 Arrival Information Details Patient Name: Date of Service: [name] 12/30/2021 1:30 PM Medical Record Number: [id number] Patient Account Number: [id number] Date of Birth/Sex: Treating RN: 1950-11-11 (71 y.o. [name], Meta.Reding Primary Care Moshe Wenger: Deland Pretty Other Clinician: Referring Tonja Jezewski: Treating Anderia Lorenzo/Extender: Judie Grieve in Treatment: 5 Visit Information History Since Last Visit Added or deleted any medications: No Patient Arrived: Wheel Chair Any new allergies or adverse reactions: No Arrival Time: 13:28 Had a fall or experienced change in No Transfer Assistance: Manual activities of daily living that may affect Patient Requires Transmission-Based Precautions: No risk of falls: Patient Has Alerts: Yes Signs or symptoms of abuse/neglect since last visito No Patient Alerts: Patient on Blood Thinner Hospitalized since last visit: No Implantable device outside of the clinic excluding No cellular tissue based products placed in the center since last visit: Has Dressing in Place as Prescribed: Yes Pain Present Now: No Electronic Signature(s) Signed: 02/01/2022 8:46:54 AM By: Erenest Blank Entered By: Erenest Blank on 12/30/2021 13:29:41 -------------------------------------------------------------------------------- Clinic Level of Care Assessment Details Patient Name: Date of Service: [name] 12/30/2021 1:30 PM Medical Record Number: [id number] Patient Account Number: [id number] Date of Birth/Sex: Treating RN: 1950/09/27 (71 y.o. [name], Meta.Reding Primary Care Ilia Dimaano: Deland Pretty Other Clinician: Referring Gawain Crombie: Treating Pavneet Markwood/Extender: Judie Grieve in Treatment: 5 Clinic Level of Care Assessment Items TOOL 4 Quantity Score X- 1 0 Use when only an EandM is performed on FOLLOW-UP visit ASSESSMENTS - Nursing  Assessment / Reassessment X- 1 10 Reassessment of Co-morbidities (includes updates in patient status) X- 1 5 Reassessment of Adherence to Treatment Plan ASSESSMENTS - Wound and Skin A ssessment / Reassessment []  - 0 Simple Wound Assessment / Reassessment - one wound X- 1 5 Complex Wound Assessment / Reassessment - multiple wounds X- 1 10 Dermatologic / Skin Assessment (not related to wound area) ASSESSMENTS - Focused Assessment X- 1 5 Circumferential Edema Measurements - multi extremities X- 1 10 Nutritional Assessment / Counseling / Intervention []  - 0 Lower Extremity Assessment (monofilament, tuning fork, pulses) []  - 0 Peripheral Arterial Disease Assessment (using hand held doppler) ASSESSMENTS - Ostomy and/or Continence Assessment and Care []  - 0 Incontinence Assessment and Management []  - 0 Ostomy Care Assessment and Management (repouching, etc.) PROCESS - Coordination of Care []  - 0 Simple Patient / Family Education for ongoing care X- 1 20 Complex (extensive) Patient / Family Education for ongoing care X- 1 10 Staff obtains Programmer, systems, Records, T Results / Process Orders est X- 1 10 Staff telephones HHA, Nursing Homes / Clarify orders / etc []  - 0 Routine Transfer to another Facility (non-emergent condition) []  - 0 Routine Hospital Admission (non-emergent condition) []  - 0 New Admissions / Biomedical engineer / Ordering NPWT Apligraf, etc. , []  - 0 Emergency Hospital Admission (emergent condition) []  - 0 Simple Discharge Coordination X- 1 15 Complex (extensive) Discharge Coordination PROCESS - Special Needs []  - 0 Pediatric / Minor Patient Management []  - 0 Isolation Patient Management []  - 0 Hearing / Language / Visual special needs []  - 0 Assessment of Community assistance (transportation, D/C planning, etc.) []  - 0 Additional assistance / Altered mentation []  - 0 Support Surface(s) Assessment (bed, cushion, seat, etc.) INTERVENTIONS - Wound  Cleansing / Measurement []  - 0 Simple Wound Cleansing - one wound X- 1 5 Complex Wound Cleansing - multiple wounds X- 1 5 Wound Imaging (photographs - any  number of wounds) []  - 0 Wound Tracing (instead of photographs) []  - 0 Simple Wound Measurement - one wound X- 1 5 Complex Wound Measurement - multiple wounds INTERVENTIONS - Wound Dressings []  - 0 Small Wound Dressing one or multiple wounds X- 1 15 Medium Wound Dressing one or multiple wounds []  - 0 Large Wound Dressing one or multiple wounds []  - 0 Application of Medications - topical []  - 0 Application of Medications - injection INTERVENTIONS - Miscellaneous []  - 0 External ear exam []  - 0 Specimen Collection (cultures, biopsies, blood, body fluids, etc.) []  - 0 Specimen(s) / Culture(s) sent or taken to Lab for analysis []  - 0 Patient Transfer (multiple staff / Civil Service fast streamer / Similar devices) []  - 0 Simple Staple / Suture removal (25 or less) []  - 0 Complex Staple / Suture removal (26 or more) []  - 0 Hypo / Hyperglycemic Management (close monitor of Blood Glucose) []  - 0 Ankle / Brachial Index (ABI) - do not check if billed separately X- 1 5 Vital Signs Has the patient been seen at the hospital within the last three years: Yes Total Score: 135 Level Of Care: New/Established - Level 4 Electronic Signature(s) Signed: 12/30/2021 4:47:21 PM By: Deon Pilling RN, BSN Entered By: Deon Pilling on 12/30/2021 14:05:02 -------------------------------------------------------------------------------- Lower Extremity Assessment Details Patient Name: Date of Service: Christine Debar [name] 12/30/2021 1:30 PM Medical Record Number: [id number] Patient Account Number: [id number] Date of Birth/Sex: Treating RN: 18-Sep-1950 (71 y.o. [name] Primary Care Tinnie Kunin: Deland Pretty Other Clinician: Referring Tecla Mailloux: Treating Tamyra Fojtik/Extender: Judie Grieve in Treatment: 5 Edema  Assessment Assessed: Shirlyn Goltz: Yes] Patrice Paradise: No] Edema: [Left: Ye] [Right: s] Calf Left: Right: Point of Measurement: 31 cm From Medial Instep 54 cm Ankle Left: Right: Point of Measurement: 9 cm From Medial Instep 26 cm Vascular Assessment Pulses: Posterior Tibial Palpable: [Left:Yes] Electronic Signature(s) Signed: 12/30/2021 4:47:21 PM By: Deon Pilling RN, BSN Signed: 02/01/2022 8:46:54 AM By: Erenest Blank Entered By: Erenest Blank on 12/30/2021 13:50:41 -------------------------------------------------------------------------------- Multi Wound Chart Details Patient Name: Date of Service: Christine Debar [name] 12/30/2021 1:30 PM Medical Record Number: [id number] Patient Account Number: [id number] Date of Birth/Sex: Treating RN: [birth year] (71 y.o. [name], Meta.Reding Primary Care Jahmez Bily: Deland Pretty Other Clinician: Referring Kaysen Sefcik: Treating Mahina Salatino/Extender: Judie Grieve in Treatment: 5 Vital Signs Height(in): 67 Pulse(bpm): 53 Weight(lbs): 340 Blood Pressure(mmHg): 126/77 Body Mass Index(BMI): 53.2 Temperature(F): 98.3 Respiratory Rate(breaths/min): 18 Photos: [N/A:N/A] Left, Lateral Lower Leg N/A N/A Wound Location: Trauma N/A N/A Wounding Event: Trauma, Other N/A N/A Primary Etiology: Congestive Heart Failure, N/A N/A Comorbid History: Hypertension 10/18/2021 N/A N/A Date Acquired: 5 N/A N/A Weeks of Treatment: Open N/A N/A Wound Status: No N/A N/A Wound Recurrence: Yes N/A N/A Clustered Wound: 1 N/A N/A Clustered Quantity: 9.4x10.6x4.7 N/A N/A Measurements L x W x D (cm) 78.257 N/A N/A A (cm) : rea 367.808 N/A N/A Volume (cm) : 59.60% N/A N/A % Reduction in A rea: 77.60% N/A N/A % Reduction in Volume: 12 Starting Position 1 (o'clock): 12 Ending Position 1 (o'clock): 6.7 Maximum Distance 1 (cm): Yes N/A N/A Undermining: Full Thickness With Exposed Support N/A N/A Classification: Structures Large N/A  N/A Exudate Amount: Serosanguineous N/A N/A Exudate Type: red, brown N/A N/A Exudate Color: Flat and Intact N/A N/A Wound Margin: Large (67-100%) N/A N/A Granulation Amount: Red, Hyper-granulation N/A N/A Granulation Quality: Small (1-33%) N/A N/A Necrotic Amount: Fat Layer (Subcutaneous Tissue): Yes N/A N/A Exposed  Structures: Muscle: Yes Fascia: No Tendon: No Joint: No Bone: No Small (1-33%) N/A N/A Epithelialization: Treatment Notes Electronic Signature(s) Signed: 12/31/2021 2:17:33 PM By: Kalman Shan DO Signed: 01/03/2022 5:07:15 PM By: Deon Pilling RN, BSN Entered By: Kalman Shan on 12/31/2021 14:12:47 -------------------------------------------------------------------------------- Ricardo Details Patient Name: Date of Service: Link Snuffer, Erick Blinks [name] 12/30/2021 1:30 PM Medical Record Number: [id number] Patient Account Number: [id number] Date of Birth/Sex: Treating RN: [birth year] (71 y.o. [name], Meta.Reding Primary Care Edda Orea: Deland Pretty Other Clinician: Referring Briyan Kleven: Treating Bettey Muraoka/Extender: Judie Grieve in Treatment: 5 Active Inactive Abuse / Safety / Falls / Self Care Management Nursing Diagnoses: History of Falls Potential for falls Goals: Patient/caregiver will verbalize/demonstrate measure taken to improve self care Date Initiated: 11/19/2021 Target Resolution Date: 01/14/2022 Goal Status: Active Patient/caregiver will verbalize/demonstrate measures taken to prevent injury and/or falls Date Initiated: 11/19/2021 Target Resolution Date: 01/21/2022 Goal Status: Active Interventions: Provide education on basic hygiene Provide education on fall prevention Provide education on HBO safety Provide education on vaccinations Notes: Necrotic Tissue Nursing Diagnoses: Impaired tissue integrity related to necrotic/devitalized tissue Goals: Necrotic/devitalized tissue will be minimized in the  wound bed Date Initiated: 11/19/2021 Target Resolution Date: 01/21/2022 Goal Status: Active Patient/caregiver will verbalize understanding of reason and process for debridement of necrotic tissue Date Initiated: 11/19/2021 Target Resolution Date: 01/21/2022 Goal Status: Active Interventions: Assess patient pain level pre-, during and post procedure and prior to discharge Provide education on necrotic tissue and debridement process Treatment Activities: Apply topical anesthetic as ordered : 11/19/2021 Excisional debridement : 11/19/2021 Notes: Pain, Acute or Chronic Nursing Diagnoses: Pain, acute or chronic: actual or potential Potential alteration in comfort, pain Goals: Patient will verbalize adequate pain control and receive pain control interventions during procedures as needed Date Initiated: 11/19/2021 Target Resolution Date: 01/22/2022 Goal Status: Active Patient/caregiver will verbalize comfort level met Date Initiated: 11/19/2021 Target Resolution Date: 01/21/2022 Goal Status: Active Interventions: Encourage patient to take pain medications as prescribed Provide education on pain management Reposition patient for comfort Treatment Activities: Administer pain control measures as ordered : 11/19/2021 Notes: Electronic Signature(s) Signed: 12/30/2021 4:47:21 PM By: Deon Pilling RN, BSN Entered By: Deon Pilling on 12/30/2021 14:01:28 -------------------------------------------------------------------------------- Pain Assessment Details Patient Name: Date of Service: Christine Debar [name] 12/30/2021 1:30 PM Medical Record Number: [id number] Patient Account Number: [id number] Date of Birth/Sex: Treating RN: 02/26/1951 (71 y.o. [name] Primary Care Ricarda Atayde: Deland Pretty Other Clinician: Referring Dade Rodin: Treating Lillian Ballester/Extender: Judie Grieve in Treatment: 5 Active Problems Location of Pain Severity and Description of Pain Patient Has  Paino No Site Locations Pain Management and Medication Current Pain Management: Electronic Signature(s) Signed: 12/30/2021 4:47:21 PM By: Deon Pilling RN, BSN Signed: 02/01/2022 8:46:54 AM By: Erenest Blank Entered By: Erenest Blank on 12/30/2021 13:31:49 -------------------------------------------------------------------------------- Patient/Caregiver Education Details Patient Name: Date of Service: [name] 5/11/2023andnbsp1:30 PM Medical Record Number: [id number] Patient Account Number: [id number] Date of Birth/Gender: Treating RN: 23-May-1951 (71 y.o. [name] Primary Care Physician: Deland Pretty Other Clinician: Referring Physician: Treating Physician/Extender: Judie Grieve in Treatment: 5 Education Assessment Education Provided To: Patient Education Topics Provided Wound Debridement: Handouts: Wound Debridement Methods: Explain/Verbal Responses: Reinforcements needed Electronic Signature(s) Signed: 12/30/2021 4:47:21 PM By: Deon Pilling RN, BSN Entered By: Deon Pilling on 12/30/2021 14:01:39 -------------------------------------------------------------------------------- Wound Assessment Details Patient Name: Date of Service: Christine Debar [name] 12/30/2021 1:30 PM Medical Record Number: [id number] Patient Account Number: [id number] Date of  Birth/Sex: Treating RN: [birth year] (71 y.o. [name], Meta.Reding Primary Care Tomma Ehinger: Deland Pretty Other Clinician: Referring Oreoluwa Aigner: Treating Lailani Tool/Extender: Judie Grieve in Treatment: 5 Wound Status Wound Number: 1 Primary Etiology: Trauma, Other Wound Location: Left, Lateral Lower Leg Wound Status: Open Wounding Event: Trauma Comorbid History: Congestive Heart Failure, Hypertension Date Acquired: 10/18/2021 Weeks Of Treatment: 5 Clustered Wound: Yes Photos Wound Measurements Length: (cm) 9.4 Width: (cm) 10.6 Depth: (cm) 4.7 Clustered  Quantity: 1 Area: (cm) 78.257 Volume: (cm) 367.808 % Reduction in Area: 59.6% % Reduction in Volume: 77.6% Epithelialization: Small (1-33%) Tunneling: No Undermining: Yes Starting Position (o'clock): 12 Ending Position (o'clock): 12 Maximum Distance: (cm) 6.7 Wound Description Classification: Full Thickness With Exposed Support Structures Wound Margin: Flat and Intact Exudate Amount: Large Exudate Type: Serosanguineous Exudate Color: red, brown Foul Odor After Cleansing: No Slough/Fibrino Yes Wound Bed Granulation Amount: Large (67-100%) Exposed Structure Granulation Quality: Red, Hyper-granulation Fascia Exposed: No Necrotic Amount: Small (1-33%) Fat Layer (Subcutaneous Tissue) Exposed: Yes Necrotic Quality: Adherent Slough Tendon Exposed: No Muscle Exposed: Yes Necrosis of Muscle: No Joint Exposed: No Bone Exposed: No Electronic Signature(s) Signed: 12/30/2021 4:47:21 PM By: Deon Pilling RN, BSN Entered By: Deon Pilling on 12/30/2021 13:53:56 -------------------------------------------------------------------------------- Vitals Details Patient Name: Date of Service: Link Snuffer, [name] 12/30/2021 1:30 PM Medical Record Number: [id number] Patient Account Number: [id number] Date of Birth/Sex: Treating RN: 1951/02/23 (71 y.o. [name], Meta.Reding Primary Care Tyrice Hewitt: Deland Pretty Other Clinician: Referring Nayan Proch: Treating Emmely Bittinger/Extender: Judie Grieve in Treatment: 5 Vital Signs Time Taken: 13:31 Temperature (F): 98.3 Height (in): 67 Pulse (bpm): 66 Weight (lbs): 340 Respiratory Rate (breaths/min): 18 Body Mass Index (BMI): 53.2 Blood Pressure (mmHg): 126/77 Reference Range: 80 - 120 mg / dl Electronic Signature(s) Signed: 02/01/2022 8:46:54 AM By: Erenest Blank Entered By: Erenest Blank on 12/30/2021 13:31:41

## 2022-02-01 NOTE — Progress Notes (Signed)
LYRIQ, FINERTY ([id number]) Visit Report for 01/26/2022 Arrival Information Details Patient Name: Date of Service: [name] 01/26/2022 1:30 PM Medical Record Number: [id number] Patient Account Number: [id number] Date of Birth/Sex: Treating RN: 1950-11-08 (71 y.o. [name], Meta.Reding Primary Care Rosette Bellavance: Deland Pretty Other Clinician: Referring Pedrohenrique Mcconville: Treating Chae Oommen/Extender: Ronni Rumble in Treatment: 9 Visit Information History Since Last Visit Added or deleted any medications: No Patient Arrived: Wheel Chair Any new allergies or adverse reactions: No Arrival Time: 13:27 Had a fall or experienced change in No Accompanied By: friend activities of daily living that may affect Transfer Assistance: None risk of falls: Patient Identification Verified: Yes Signs or symptoms of abuse/neglect since last visito No Secondary Verification Process Completed: Yes Hospitalized since last visit: No Patient Requires Transmission-Based Precautions: No Implantable device outside of the clinic excluding No Patient Has Alerts: Yes cellular tissue based products placed in the center Patient Alerts: Patient on Blood Thinner since last visit: Has Dressing in Place as Prescribed: Yes Pain Present Now: No Electronic Signature(s) Signed: 02/01/2022 8:46:10 AM By: Erenest Blank Entered By: Erenest Blank on 01/26/2022 13:28:27 -------------------------------------------------------------------------------- Clinic Level of Care Assessment Details Patient Name: Date of Service: Christine Debar [name] 01/26/2022 1:30 PM Medical Record Number: [id number] Patient Account Number: [id number] Date of Birth/Sex: Treating RN: 11/25/1950 (71 y.o. [name] Primary Care Hajer Dwyer: Deland Pretty Other Clinician: Referring Jamar Weatherall: Treating Myishia Kasik/Extender: Ronni Rumble in Treatment: 9 Clinic Level of Care Assessment Items TOOL 4 Quantity  Score X- 1 0 Use when only an EandM is performed on FOLLOW-UP visit ASSESSMENTS - Nursing Assessment / Reassessment X- 1 10 Reassessment of Co-morbidities (includes updates in patient status) X- 1 5 Reassessment of Adherence to Treatment Plan ASSESSMENTS - Wound and Skin A ssessment / Reassessment X - Simple Wound Assessment / Reassessment - one wound 1 5 _0  - 0 Complex Wound Assessment / Reassessment - multiple wounds _1  - 0 Dermatologic / Skin Assessment (not related to wound area) ASSESSMENTS - Focused Assessment _2  - 0 Circumferential Edema Measurements - multi extremities _3  - 0 Nutritional Assessment / Counseling / Intervention X- 1 5 Lower Extremity Assessment (monofilament, tuning fork, pulses) _4  - 0 Peripheral Arterial Disease Assessment (using hand held doppler) ASSESSMENTS - Ostomy and/or Continence Assessment and Care _5  - 0 Incontinence Assessment and Management _6  - 0 Ostomy Care Assessment and Management (repouching, etc.) PROCESS - Coordination of Care X - Simple Patient / Family Education for ongoing care 1 15 _7  - 0 Complex (extensive) Patient / Family Education for ongoing care X- 1 10 Staff obtains Programmer, systems, Records, T Results / Process Orders est X- 1 10 Staff telephones HHA, Nursing Homes / Clarify orders / etc _8  - 0 Routine Transfer to another Facility (non-emergent condition) _9  - 0 Routine Hospital Admission (non-emergent condition) _10  - 0 New Admissions / Biomedical engineer / Ordering NPWT Apligraf, etc. , _11  - 0 Emergency Hospital Admission (emergent condition) X- 1 10 Simple Discharge Coordination _12  - 0 Complex (extensive) Discharge Coordination PROCESS - Special Needs _13  - 0 Pediatric / Minor Patient Management _14  - 0 Isolation Patient Management _15  - 0 Hearing / Language / Visual special needs _16  - 0 Assessment of Community assistance (transportation, D/C planning, etc.) _17  - 0 Additional assistance / Altered  mentation _18  - 0 Support Surface(s) Assessment (bed, cushion, seat, etc.) INTERVENTIONS - Wound Cleansing / Measurement X - Simple Wound Cleansing - one wound 1 5 _19  -  0 Complex Wound Cleansing - multiple wounds X- 1 5 Wound Imaging (photographs - any number of wounds) _0  - 0 Wound Tracing (instead of photographs) X- 1 5 Simple Wound Measurement - one wound _1  - 0 Complex Wound Measurement - multiple wounds INTERVENTIONS - Wound Dressings _2  - 0 Small Wound Dressing one or multiple wounds _3  - 0 Medium Wound Dressing one or multiple wounds X- 1 20 Large Wound Dressing one or multiple wounds <YSAYTKZSWFUXNATF>_5<\/DDUKGURKYHCWCBJS>_2  - 0 Application of Medications - topical <GBTDVVOHYWVPXTGG>_2<\/IRSWNIOEVOJJKKXF>_8  - 0 Application of Medications - injection INTERVENTIONS - Miscellaneous _6  - 0 External ear exam _7  - 0 Specimen Collection (cultures, biopsies, blood, body fluids, etc.) _8  - 0 Specimen(s) / Culture(s) sent or taken to Lab for analysis _9  - 0 Patient Transfer (multiple staff / Civil Service fast streamer / Similar devices) _10  - 0 Simple Staple / Suture removal (25 or less) _11  - 0 Complex Staple / Suture removal (26 or more) _12  - 0 Hypo / Hyperglycemic Management (close monitor of Blood Glucose) _13  - 0 Ankle / Brachial Index (ABI) - do not check if billed separately X- 1 5 Vital Signs Has the patient been seen at the hospital within the last three years: Yes Total Score: 110 Level Of Care: New/Established - Level 3 Electronic Signature(s) Signed: 01/26/2022 5:22:20 PM By: Levan Hurst RN, BSN Entered By: Levan Hurst on 01/26/2022 16:44:59 -------------------------------------------------------------------------------- Encounter Discharge Information Details Patient Name: Date of Service: Christine Debar [name] 01/26/2022 1:30 PM Medical Record Number: [id number] Patient Account Number: [id number] Date of Birth/Sex: Treating RN: 03/14/1951 (71 y.o. [name] Primary Care Skyllar Notarianni: Deland Pretty Other Clinician: Referring  Annalei Friesz: Treating Kimberely Mccannon/Extender: Ronni Rumble in Treatment: 9 Encounter Discharge Information Items Discharge Condition: Stable Ambulatory Status: Wheelchair Discharge Destination: Home Transportation: Private Auto Accompanied By: family members Schedule Follow-up Appointment: Yes Clinical Summary of Care: Patient Declined Electronic Signature(s) Signed: 01/26/2022 5:22:20 PM By: Levan Hurst RN, BSN Entered By: Levan Hurst on 01/26/2022 16:45:41 -------------------------------------------------------------------------------- Lower Extremity Assessment Details Patient Name: Date of Service: Christine Debar [name] 01/26/2022 1:30 PM Medical Record Number: [id number] Patient Account Number: [id number] Date of Birth/Sex: Treating RN: June 07, 1951 (72 y.o. [name] Primary Care Chisom Aust: Deland Pretty Other Clinician: Referring Nasire Reali: Treating Mena Simonis/Extender: Ronni Rumble in Treatment: 9 Edema Assessment Assessed: [name]: No] [name]: No] Edema: [Left: Ye] [Right: s] Calf Left: Right: Point of Measurement: 31 cm From Medial Instep 57.2 cm Ankle Left: Right: Point of Measurement: 9 cm From Medial Instep 26 cm Vascular Assessment Pulses: Dorsalis Pedis Palpable: [Left:Yes] Electronic Signature(s) Signed: 01/26/2022 5:06:31 PM By: Deon Pilling RN, BSN Signed: 02/01/2022 8:46:10 AM By: Erenest Blank Entered By: Erenest Blank on 01/26/2022 13:44:40 -------------------------------------------------------------------------------- Multi Wound Chart Details Patient Name: Date of Service: Christine Debar [name] 01/26/2022 1:30 PM Medical Record Number: [id number] Patient Account Number: [id number] Date of Birth/Sex: Treating RN: 02-15-1951 (71 y.o. [name] Primary Care Bellami Farrelly: Deland Pretty Other Clinician: Referring Ranferi Clingan: Treating Asa Fath/Extender: Ronni Rumble in Treatment:  9 Vital Signs Height(in): 67 Pulse(bpm): 62 Weight(lbs): 340 Blood Pressure(mmHg): 157/60 Body Mass Index(BMI): 53.2 Temperature(F): 97.9 Respiratory Rate(breaths/min): 18 Photos: [N/A:N/A] Left, Lateral Lower Leg N/A N/A Wound Location: Trauma N/A N/A Wounding Event: Trauma, Other N/A N/A Primary Etiology: Congestive Heart Failure, N/A N/A Comorbid History: Hypertension 10/18/2021 N/A N/A Date Acquired: 9 N/A N/A Weeks of Treatment: Open N/A N/A Wound Status: No N/A N/A Wound Recurrence: Yes N/A N/A Clustered Wound: 1  N/A N/A Clustered Quantity: 5.9x8.8x2.8 N/A N/A Measurements L x W x D (cm) 40.778 N/A N/A A (cm) : rea 114.178 N/A N/A Volume (cm) : 78.90% N/A N/A % Reduction in A rea: 93.10% N/A N/A % Reduction in Volume: 10 Starting Position 1 (o'clock): 12 Ending Position 1 (o'clock): 7.5 Maximum Distance 1 (cm): Yes N/A N/A Undermining: Full Thickness With Exposed Support N/A N/A Classification: Structures Medium N/A N/A Exudate Amount: Serosanguineous N/A N/A Exudate Type: red, brown N/A N/A Exudate Color: Flat and Intact N/A N/A Wound Margin: Large (67-100%) N/A N/A Granulation Amount: Red, Friable N/A N/A Granulation Quality: Small (1-33%) N/A N/A Necrotic Amount: Fat Layer (Subcutaneous Tissue): Yes N/A N/A Exposed Structures: Muscle: Yes Fascia: No Tendon: No Joint: No Bone: No Small (1-33%) N/A N/A Epithelialization: Treatment Notes Electronic Signature(s) Signed: 01/26/2022 5:06:31 PM By: Deon Pilling RN, BSN Signed: 01/26/2022 5:09:19 PM By: Linton Ham MD Entered By: Linton Ham on 01/26/2022 14:06:12 -------------------------------------------------------------------------------- Multi-Disciplinary Care Plan Details Patient Name: Date of Service: Christine Debar [name] 01/26/2022 1:30 PM Medical Record Number: [id number] Patient Account Number: [id number] Date of Birth/Sex: Treating RN: Feb 23, 1951 (71 y.o. [name] Primary Care Fadia Marlar: Deland Pretty Other Clinician: Referring Adreonna Yontz: Treating Kizzie Cotten/Extender: Ronni Rumble in Treatment: 9 Active Inactive Abuse / Safety / Falls / Self Care Management Nursing Diagnoses: History of Falls Potential for falls Goals: Patient/caregiver will verbalize/demonstrate measure taken to improve self care Date Initiated: 11/19/2021 Target Resolution Date: 02/18/2022 Goal Status: Active Patient/caregiver will verbalize/demonstrate measures taken to prevent injury and/or falls Date Initiated: 11/19/2021 Target Resolution Date: 02/18/2022 Goal Status: Active Interventions: Provide education on basic hygiene Provide education on fall prevention Provide education on HBO safety Provide education on vaccinations Notes: Necrotic Tissue Nursing Diagnoses: Impaired tissue integrity related to necrotic/devitalized tissue Goals: Necrotic/devitalized tissue will be minimized in the wound bed Date Initiated: 11/19/2021 Target Resolution Date: 02/18/2022 Goal Status: Active Patient/caregiver will verbalize understanding of reason and process for debridement of necrotic tissue Date Initiated: 11/19/2021 Target Resolution Date: 02/18/2022 Goal Status: Active Interventions: Assess patient pain level pre-, during and post procedure and prior to discharge Provide education on necrotic tissue and debridement process Treatment Activities: Apply topical anesthetic as ordered : 11/19/2021 Excisional debridement : 11/19/2021 Notes: Pain, Acute or Chronic Nursing Diagnoses: Pain, acute or chronic: actual or potential Potential alteration in comfort, pain Goals: Patient will verbalize adequate pain control and receive pain control interventions during procedures as needed Date Initiated: 11/19/2021 Target Resolution Date: 02/18/2022 Goal Status: Active Patient/caregiver will verbalize comfort level met Date Initiated:  11/19/2021 Target Resolution Date: 02/18/2022 Goal Status: Active Interventions: Encourage patient to take pain medications as prescribed Provide education on pain management Reposition patient for comfort Treatment Activities: Administer pain control measures as ordered : 11/19/2021 Notes: Electronic Signature(s) Signed: 01/26/2022 5:22:20 PM By: Levan Hurst RN, BSN Entered By: Levan Hurst on 01/26/2022 13:50:56 -------------------------------------------------------------------------------- Pain Assessment Details Patient Name: Date of Service: Christine Debar [name] 01/26/2022 1:30 PM Medical Record Number: [id number] Patient Account Number: [id number] Date of Birth/Sex: Treating RN: September 16, 1950 (71 y.o. [name] Primary Care Keyshla Tunison: Deland Pretty Other Clinician: Referring Tiffney Haughton: Treating Danica Camarena/Extender: Ronni Rumble in Treatment: 9 Active Problems Location of Pain Severity and Description of Pain Patient Has Paino No Site Locations Pain Management and Medication Current Pain Management: Electronic Signature(s) Signed: 01/26/2022 5:06:31 PM By: Deon Pilling RN, BSN Signed: 02/01/2022 8:46:10 AM By: Erenest Blank Entered By: Erenest Blank on 01/26/2022  13:29:03 -------------------------------------------------------------------------------- Patient/Caregiver Education Details Patient Name: Date of Service: [name] 6/7/2023andnbsp1:30 PM Medical Record Number: [id number] Patient Account Number: [id number] Date of Birth/Gender: Treating RN: [birth year] (71 y.o. [name] Primary Care Physician: Deland Pretty Other Clinician: Referring Physician: Treating Physician/Extender: Ronni Rumble in Treatment: 9 Education Assessment Education Provided To: Patient Education Topics Provided Wound/Skin Impairment: Methods: Explain/Verbal Responses: State content correctly [employer]) Signed: 01/26/2022 5:22:20 PM By: Levan Hurst RN, BSN Entered By: Levan Hurst on 01/26/2022 13:51:09 -------------------------------------------------------------------------------- Wound Assessment Details Patient Name: Date of Service: Christine Debar [name] 01/26/2022 1:30 PM Medical Record Number: [id number] Patient Account Number: [id number] Date of Birth/Sex: Treating RN: [birth year] (71 y.o. [name] Primary Care Johnny Latu: Deland Pretty Other Clinician: Referring Robena Ewy: Treating Terreon Ekholm/Extender: Ronni Rumble in Treatment: 9 Wound Status Wound Number: 1 Primary Etiology: Trauma, Other Wound Location: Left, Lateral Lower Leg Wound Status: Open Wounding Event: Trauma Comorbid History: Congestive Heart Failure, Hypertension Date Acquired: 10/18/2021 Weeks Of Treatment: 9 Clustered Wound: Yes Photos Wound Measurements Length: (cm) 5.9 Width: (cm) 8.8 Depth: (cm) 2.8 Clustered Quantity: 1 Area: (cm) 40.778 Volume: (cm) 114.178 % Reduction in Area: 78.9% % Reduction in Volume: 93.1% Epithelialization: Small (1-33%) Tunneling: No Undermining: Yes Starting Position (o'clock): 10 Ending Position (o'clock): 12 Maximum Distance: (cm) 7.5 Wound Description Classification: Full Thickness With Exposed Support Structures Wound Margin: Flat and Intact Exudate Amount: Medium Exudate Type: Serosanguineous Exudate Color: red, brown Foul Odor After Cleansing: No Slough/Fibrino Yes Wound Bed Granulation Amount: Large (67-100%) Exposed Structure Granulation Quality: Red, Friable Fascia Exposed: No Necrotic Amount: Small (1-33%) Fat Layer (Subcutaneous Tissue) Exposed: Yes Necrotic Quality: Adherent Slough Tendon Exposed: No Muscle Exposed: Yes Necrosis of Muscle: No Joint Exposed: No Bone Exposed: No Treatment Notes Wound #1 (Lower Leg) Wound Laterality: Left, Lateral Cleanser Wound Cleanser Discharge Instruction:  Cleanse the wound with wound cleanser prior to applying a clean dressing using gauze sponges, not tissue or cotton balls. Peri-Wound Care Topical Primary Dressing Plain packing strip 1/2 (in) Discharge Instruction: ****Or Conforming Stretch Gauze Bandage, Sterile 2x75 (in/in)**** Lightly pack as instructed. May use 1" or conform gauze Dakin's Solution 0.25%, 16 (oz) Discharge Instruction: Moisten gauze with Dakin's solution Secondary Dressing ABD Pad, 8x10 Discharge Instruction: Apply over primary dressing as directed. Woven Gauze Sponge, Non-Sterile 4x4 in Discharge Instruction: Apply over primary dressing as directed. Zetuvit Plus 4x8 in Discharge Instruction: Apply over primary dressing as directed. Secured With Elastic Bandage 4 inch (ACE bandage) Discharge Instruction: Secure with ACE bandage as directed. Kerlix Roll Sterile, 4.5x3.1 (in/yd) Discharge Instruction: Secure with Kerlix as directed. 42M Medipore H Soft Cloth Surgical T ape, 4 x 10 (in/yd) Discharge Instruction: Secure with tape as directed. Compression Wrap Compression Stockings Add-Ons Electronic Signature(s) Signed: 01/26/2022 5:22:20 PM By: Levan Hurst RN, BSN Signed: 01/27/2022 4:55:17 PM By: Rhae Hammock RN Entered By: Rhae Hammock on 01/26/2022 13:47:25 -------------------------------------------------------------------------------- Vitals Details Patient Name: Date of Service: Link Snuffer, Christine [name] 01/26/2022 1:30 PM Medical Record Number: [id number] Patient Account Number: [id number] Date of Birth/Sex: Treating RN: December 01, 1950 (71 y.o. [name], Meta.Reding Primary Care Berenise Hunton: Deland Pretty Other Clinician: Referring Trevious Rampey: Treating Mirren Gest/Extender: Ronni Rumble in Treatment: 9 Vital Signs Time Taken: 13:28 Temperature (F): 97.9 Height (in): 67 Pulse (bpm): 52 Weight (lbs): 340 Respiratory Rate (breaths/min): 18 Body Mass Index (BMI): 53.2 Blood Pressure  (mmHg): 157/60 Reference Range: 80 - 120 mg / dl Electronic Signature(s) Signed:  02/01/2022 8:46:10 AM By: Erenest Blank Entered By: Erenest Blank on 01/26/2022 13:28:53

## 2022-02-02 DIAGNOSIS — D500 Iron deficiency anemia secondary to blood loss (chronic): Secondary | ICD-10-CM | POA: Diagnosis not present

## 2022-02-03 ENCOUNTER — Encounter (HOSPITAL_BASED_OUTPATIENT_CLINIC_OR_DEPARTMENT_OTHER): Payer: PPO | Admitting: Internal Medicine

## 2022-02-03 DIAGNOSIS — T798XXA Other early complications of trauma, initial encounter: Secondary | ICD-10-CM

## 2022-02-03 DIAGNOSIS — Z7901 Long term (current) use of anticoagulants: Secondary | ICD-10-CM | POA: Diagnosis not present

## 2022-02-03 DIAGNOSIS — I480 Paroxysmal atrial fibrillation: Secondary | ICD-10-CM

## 2022-02-03 DIAGNOSIS — L97822 Non-pressure chronic ulcer of other part of left lower leg with fat layer exposed: Secondary | ICD-10-CM | POA: Diagnosis not present

## 2022-02-03 NOTE — Progress Notes (Signed)
AUNDREA, FRUHLING ([id number]) Visit Report for 02/03/2022 Chief Complaint Document Details Patient Name: Date of Service: [name] 02/03/2022 1:30 PM Medical Record Number: [id number] Patient Account Number: [id number] Date of Birth/Sex: Treating RN: 12-30-1950 (71 y.o. [name] Primary Care Provider: Deland Pretty Other Clinician: Referring Provider: Treating Provider/Extender: Judie Grieve in Treatment: 10 Information Obtained from: Patient Chief Complaint 11/19/2021; Left lower extremity wound status post fall Electronic Signature(s) Signed: 02/03/2022 3:54:49 PM By: Kalman Shan DO Entered By: Kalman Shan on 02/03/2022 15:47:12 -------------------------------------------------------------------------------- HPI Details Patient Name: Date of Service: [name], Christine [name] 02/03/2022 1:30 PM Medical Record Number: [id number] Patient Account Number: [id number] Date of Birth/Sex: Treating RN: 1951/06/07 (71 y.o. [name] Primary Care Provider: Deland Pretty Other Clinician: Referring Provider: Treating Provider/Extender: Judie Grieve in Treatment: 10 History of Present Illness HPI Description: Admission 11/19/2021 [name] is a 71 year old female with a past medical history of paroxysmal A-fib on Eliquis, hypothyroidism, major depressive disorder, venous insufficiency and chronic diastolic heart failure that presents to the clinic for a 1 month history of nonhealing wound to the left lower extremity. She visited the ED on 10/18/2021 after a mechanical fall. She developed a hematoma that subsequently opened. She was hospitalized for 7 days and discharged on 10/25/2021. She has been on several different antibiotics For the past month. She states that most recently she was on Levaquin and linezolid for the past week. She states she completes her antibiotic course tomorrow. She has been using  Dakin's wet-to-dry dressings to the wound bed. She denies signs of infection. 4/7; patient presents for follow-up. She has been using Dakin's wet-to-dry dressings. She did end up going to the ED on 4/1 because she had excess bleeding with dressing change that she could not stop. She is on Eliquis for A-fib. In the ED they tied off a small artery. She has had no issues since discharge. She denies signs of infection. 4/14; this is a very difficult clinical situation. A patient with underlying chronic venous insufficiency and lymphedema very significant lower extremity edema had a hematoma after a fall on her left upper lateral lower leg. She is on Eliquis for atrial fibrillation apparently with a history of a splenic infarct following with Dr. Jolyn Nap of cardiology. She has exhibited significant bleeding from the wound surface including ao Venous bleeder that required suturing short while ago. She has been using Dakin's wet-to-dry packing and over the surface of the wound. She saw Dr. Caryl Comes yesterday he is reluctant to consider stopping the Eliquis because of the prior history of presumed cardioembolism. Wants to communicate with Dr. Heber [place] when she returns. In a perfect world where she was not on Eliquis she requires a wound VAC with additional compression wraps but I understand the reluctance to do this because of the concerns of bleeding 4/28; the patient's wound actually looks better today using Dakin's wet-to-dry that she is changing twice a day she is wrapping this with Kerlix and Ace wrapping. She tells me she had 2 small bleeding areas which were part of the superficial wound that stopped this week with direct pressure. Other than that no major issues. In follow-up from discussion of last week Dr. 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 The bigger question  from my point of view is the wound VAC issue. As far as she knows and her daughter-in-law verifies that she has not had any bleeding from the deeper parts of the wound although the bleeding has been superficial including the one that sent her to the ER for stitches. She is concerned that a wound VAC would cause further bleeding and I cannot completely allay those concerns. 5/8; patient presents for follow-up. She has no issues or complaints today. She has been using Dakin's wet-to-dry dressings without issues. She denies signs of infection. 5/12; patient presents for follow-up. She continues to use Dakin's wet-to-dry dressings without issues. She denies signs of infection. She reports some issues with bleeding at times but this has improved. She denies signs of infection. 6/1; patient presents for follow-up. She was recently hospitalized for upper left leg thigh cellulitis. She was given IV cefepime and vancomycin and discharged on oral antibiotics. She has been using Dakin's wet-to-dry dressings to the left lower leg wound. She reports improvement in healing. She currently denies systemic signs of infection. 6/7; patient is using Dakin's wet-to-dry twice daily. In general this looks better than when I saw this a month or so ago however still considerable depth to the tunnel in her left leg. She is going for iron infusions ordered by her primary care doctor I believe 6/15; patient presents for follow-up. She has been using Dakin's wet-to-dry dressings. She has noted some scattered small areas that blister up and heal on her left lower leg. She has been using mupirocin ointment on them. Nothing open today. Electronic Signature(s) Signed: 02/03/2022 3:54:49 PM By: Kalman Shan DO Entered By: Kalman Shan on 02/03/2022 15:48:29 -------------------------------------------------------------------------------- Physical Exam Details Patient Name: Date of  Service: Christine Debar [name] 02/03/2022 1:30 PM Medical Record Number: [id number] Patient Account Number: [id number] Date of Birth/Sex: Treating RN: 09-15-1950 (71 y.o. [name] Primary Care Provider: Deland Pretty Other Clinician: Referring Provider: Treating Provider/Extender: Judie Grieve in Treatment: 10 Constitutional respirations regular, non-labored and within target range for patient.. Cardiovascular 2+ dorsalis pedis/posterior tibialis pulses. Psychiatric pleasant and cooperative. Notes Left lower extremity: Open wound with granulation tissue to the surrounding opening. Increased depth and tunneling within. Electronic Signature(s) Signed: 02/03/2022 3:54:49 PM By: Kalman Shan DO Entered By: Kalman Shan on 02/03/2022 15:49:46 -------------------------------------------------------------------------------- Physician Orders Details Patient Name: Date of Service: [name], Christine [name] 02/03/2022 1:30 PM Medical Record Number: [id number] Patient Account Number: [id number] Date of Birth/Sex: Treating RN: 1951/01/29 (71 y.o. [name] Primary Care Provider: Deland Pretty Other Clinician: Referring Provider: Treating Provider/Extender: Judie Grieve in Treatment: 10 Verbal / Phone Orders: No Diagnosis Coding ICD-10 Coding Code Description [id number] Non-pressure chronic ulcer of other part of left lower leg with fat layer exposed T79.8XXA Other early complications of trauma, initial encounter I48.0 Paroxysmal atrial fibrillation Z79.01 Long term (current) use of anticoagulants Follow-up Appointments ppointment in 1 week. - Dr. Heber [place] and Leveda Anna Friday morning 02/11/2022 0930 Return A Dr. Heber  and [place], Room 8 02/17/2022 Thursday 130pm Other: - Patient to call [facility] to be fitted for compression stockings. Address [place]; Phone [phone]. Call them to make an appt  time. ***Pick up prescription at pharmacy*** Bathing/ Shower/ Hygiene May shower with protection but do not get wound dressing(s) wet. Edema Control - Lymphedema / SCD / Other Elevate legs to the level of the heart or above for 30 minutes daily and/or when sitting, a frequency of: -  3-4 times a day throughout the day. Avoid standing for long periods of time. Compression stocking or Garment 20-30 mm/Hg pressure to: - patient to purchase and wear apply in the morning and remove at night. no ace wrap when wearing. Home Health No change in wound care orders this week; continue Home Health for wound care. May utilize formulary equivalent dressing for wound treatment orders unless otherwise specified. - 2x a week dressing changes and all other days family member to change. Other Home Health Orders/Instructions: - [facility] Wound Treatment Wound #1 - Lower Leg Wound Laterality: Left, Lateral Cleanser: Wound Cleanser (Home Health) 1 x Per Day/30 Days Discharge Instructions: Cleanse the wound with wound cleanser prior to applying a clean dressing using gauze sponges, not tissue or cotton balls. Prim Dressing: Hydrofera Blue Ready Foam, 4x5 in 1 x Per Day/30 Days ary Discharge Instructions: Apply to wound bed over the wet to dry packing. Prim Dressing: Plain packing strip 1/4 (in) 1 x Per Day/30 Days ary Discharge Instructions: Lightly pack as instructed Prim Dressing: Dakin's Solution 0.25%, 16 (oz) (Home Health) 1 x Per Day/30 Days ary Discharge Instructions: Moisten gauze with Dakin's solution Secondary Dressing: ABD Pad, 8x10 (Home Health) 1 x Per Day/30 Days Discharge Instructions: Apply over primary dressing as directed. Secondary Dressing: Woven Gauze Sponge, Non-Sterile 4x4 in (Home Health) 1 x Per Day/30 Days Discharge Instructions: Apply over primary dressing as directed. Secured With: Elastic Bandage 4 inch (ACE bandage) (Home Health) 1 x Per Day/30 Days Discharge Instructions:  Secure with ACE bandage as directed. Secured With: [employer], 4.5x3.1 (in/yd) (Home Health) 1 x Per Day/30 Days Discharge Instructions: Secure with Kerlix as directed. Secured With: 60M Medipore H Soft Cloth Surgical T ape, 4 x 10 (in/yd) (Home Health) 1 x Per Day/30 Days Discharge Instructions: Secure with tape as directed. Patient Medications llergies: Augmentin A Notifications Medication Indication Start End 02/07/2022 mupirocin DOSE 1 - topical 2 % ointment - Apply daily to the wound bed Electronic Signature(s) Signed: 02/07/2022 12:44:15 PM By: Kalman Shan DO Previous Signature: 02/07/2022 12:08:49 PM Version By: Kalman Shan DO Previous Signature: 02/03/2022 3:54:49 PM Version By: Kalman Shan DO Entered By: Kalman Shan on 02/07/2022 12:09:03 -------------------------------------------------------------------------------- Problem List Details Patient Name: Date of Service: Christine Debar [name] 02/03/2022 1:30 PM Medical Record Number: [id number] Patient Account Number: [id number] Date of Birth/Sex: Treating RN: [birth year] (71 y.o. 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 02/03/2022 1:30 PM Medical Record Number: [id number] Patient Account Number: [id number] Date of Birth/Sex: Treating RN: October 14, 1950 (71 y.o. [name] Primary Care Provider: Deland Pretty Other Clinician: Referring Provider: Treating Provider/Extender: Judie Grieve in Treatment: 10 Subjective Chief Complaint Information obtained from Patient 11/19/2021; Left lower extremity wound status post fall History of Present Illness (HPI) Admission 11/19/2021 Ms. Kayliana Ackerley is a 71 year old female with a past medical history  of paroxysmal A-fib on Eliquis, hypothyroidism, major depressive disorder, venous insufficiency and chronic diastolic heart failure that presents to the clinic for a 1 month history of nonhealing wound to the left lower extremity. She visited the ED on 10/18/2021 after a mechanical fall. She developed a hematoma that subsequently opened. She was hospitalized for 7 days and discharged on 10/25/2021. She has been on several different antibiotics For the past month. She states that most recently she was on Levaquin and linezolid for the past week. She states she completes her antibiotic course tomorrow. She has been using Dakin's wet-to-dry dressings to the wound bed. She denies signs of infection. 4/7; patient presents for follow-up. She has been using Dakin's wet-to-dry dressings. She did end up going to the ED on 4/1 because she had excess bleeding with dressing change that she could not stop. She is on Eliquis for A-fib. In the ED they tied off a small artery. She has had no issues since discharge. She denies signs of infection. 4/14; this is a very difficult clinical situation. A patient with underlying chronic venous insufficiency and lymphedema very significant lower extremity edema had a hematoma after a fall on her left upper lateral lower leg. She is on Eliquis for atrial fibrillation  apparently with a history of a splenic infarct following with Dr. Berton Mount of cardiology. She has exhibited significant bleeding from the wound surface including ao Venous bleeder that required suturing short while ago. She has been using Dakin's wet-to-dry packing and over the surface of the wound. She saw Dr. Graciela Husbands yesterday he is reluctant to consider stopping the Eliquis because of the prior history of presumed cardioembolism. Wants to communicate with Dr. Mikey Bussing when she returns. In a perfect world where she was not on Eliquis she requires a wound VAC with additional compression wraps but I understand the reluctance to do this because of the concerns of bleeding 4/28; the patient's wound actually looks better today using Dakin's wet-to-dry that she is changing twice a day she is wrapping this with Kerlix and Ace wrapping. She tells me she had 2 small bleeding areas which were part of the superficial wound that stopped this week with direct pressure. Other than that no major issues. In follow-up from discussion of last week Dr. 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 The bigger question from my point of view is the wound VAC issue. As far as she knows and her daughter-in-law verifies that she has not had any bleeding from the deeper parts of the wound although the bleeding has been superficial including the one that sent her to the ER for stitches. She is concerned that a wound VAC would cause further bleeding and I cannot completely allay those concerns. 5/8; patient presents for follow-up. She has no issues or complaints today. She has been using Dakin's wet-to-dry dressings without issues. She denies signs of infection. 5/12; patient presents for follow-up. She continues to use Dakin's wet-to-dry dressings without issues.  She denies signs of infection. She reports some issues with bleeding at times but this has improved. She denies signs of infection. 6/1; patient presents for follow-up. She was recently hospitalized for upper left leg thigh cellulitis. She was given IV cefepime and vancomycin and discharged on oral antibiotics. She has been using Dakin's wet-to-dry dressings to the left lower  leg wound. She reports improvement in healing. She currently denies systemic signs of infection. 6/7; patient is using Dakin's wet-to-dry twice daily. In general this looks better than when I saw this a month or so ago however still considerable depth to the tunnel in her left leg. She is going for iron infusions ordered by her primary care doctor I believe 6/15; patient presents for follow-up. She has been using Dakin's wet-to-dry dressings. She has noted some scattered small areas that blister up and heal on her left lower leg. She has been using mupirocin ointment on them. Nothing open today. Patient History Medical History Cardiovascular Patient has history of Congestive Heart Failure, Hypertension Hospitalization/Surgery History - Cellulitis left leg- 01/03/2022-01/07/2022. Medical A Surgical History Notes nd Constitutional Symptoms (General Health) Infarction of spleen Hematologic/Lymphatic Hypothyroidism Cardiovascular A-Fib Gastrointestinal Gastroesophageal reflux Genitourinary Chronic kidney disease Musculoskeletal Osteoarthritis of knee Psychiatric Anxiety Objective Constitutional respirations regular, non-labored and within target range for patient.. Vitals Time Taken: 1:27 PM, Height: 67 in, Weight: 340 lbs, BMI: 53.2, Temperature: 98.8 F, Pulse: 58 bpm, Respiratory Rate: 22 breaths/min, Blood Pressure: 160/74 mmHg. Cardiovascular 2+ dorsalis pedis/posterior tibialis pulses. Psychiatric pleasant and cooperative. General Notes: Left lower extremity: Open wound with granulation tissue to the  surrounding opening. Increased depth and tunneling within. Integumentary (Hair, Skin) Wound #1 status is Open. Original cause of wound was Trauma. The date acquired was: 10/18/2021. The wound has been in treatment 10 weeks. The wound is located on the Left,Lateral Lower Leg. The wound measures 4.5cm length x 7.5cm width x 4cm depth; 26.507cm^2 area and 106.029cm^3 volume. There is muscle and Fat Layer (Subcutaneous Tissue) exposed. Tunneling has been noted at 12:00 with a maximum distance of 8.5cm. Undermining begins at 9:00 and ends at 12:00 with a maximum distance of 2.5cm. There is a medium amount of serosanguineous drainage noted. The wound margin is flat and intact. There is large (67-100%) red, friable granulation within the wound bed. There is a small (1-33%) amount of necrotic tissue within the wound bed including Adherent Slough. Assessment Active Problems ICD-10 Non-pressure chronic ulcer of other part of left lower leg with fat layer exposed Other early complications of trauma, initial encounter Paroxysmal atrial fibrillation Long term (current) use of anticoagulants Patient's wound has shown improvement in size since last clinic visit. At this time I recommended continuing Dakin's once daily and then adding Hydrofera Blue to the granulation tissue to the surrounding skin at the opening. She would likely benefit from a custom compression garment. We sent a prescription with her to be fitted. She can also use mupirocin ointment as needed to any open wounds that develop on the left lower extremity. Nothing open today. Plan Follow-up Appointments: Return Appointment in 1 week. - Dr. Heber Regin[place] and Leveda Anna Friday morning 02/11/2022 0930 Dr. Heber [place] and [place], Room 8 02/17/2022 Thursday 130pm Other: - Patient to call [facility] to be fitted for compression stockings. Address [place]; Phone [phone]. Call them to make an appt time. ***Pick up prescription at  pharmacy*** Bathing/ Shower/ Hygiene: May shower with protection but do not get wound dressing(s) wet. Edema Control - Lymphedema / SCD / Other: Elevate legs to the level of the heart or above for 30 minutes daily and/or when sitting, a frequency of: - 3-4 times a day throughout the day. Avoid standing for long periods of time. Compression stocking or Garment 20-30 mm/Hg pressure to: - patient to purchase and wear apply in the morning and remove at night. no  ace wrap when wearing. Home Health: No change in wound care orders this week; continue Home Health for wound care. May utilize formulary equivalent dressing for wound treatment orders unless otherwise specified. - 2x a week dressing changes and all other days family member to change. Other Home Health Orders/Instructions: - [facility] WOUND #1: - Lower Leg Wound Laterality: Left, Lateral Cleanser: Wound Cleanser (Home Health) 1 x Per Day/30 Days Discharge Instructions: Cleanse the wound with wound cleanser prior to applying a clean dressing using gauze sponges, not tissue or cotton balls. Prim Dressing: Hydrofera Blue Ready Foam, 4x5 in 1 x Per Day/30 Days ary Discharge Instructions: Apply to wound bed over the wet to dry packing. Prim Dressing: Plain packing strip 1/4 (in) 1 x Per Day/30 Days ary Discharge Instructions: Lightly pack as instructed Prim Dressing: Dakin's Solution 0.25%, 16 (oz) (Home Health) 1 x Per Day/30 Days ary Discharge Instructions: Moisten gauze with Dakin's solution Secondary Dressing: ABD Pad, 8x10 (Home Health) 1 x Per Day/30 Days Discharge Instructions: Apply over primary dressing as directed. Secondary Dressing: Woven Gauze Sponge, Non-Sterile 4x4 in (Home Health) 1 x Per Day/30 Days Discharge Instructions: Apply over primary dressing as directed. Secured With: Elastic Bandage 4 inch (ACE bandage) (Home Health) 1 x Per Day/30 Days Discharge Instructions: Secure with ACE bandage as directed. Secured  With: [employer], 4.5x3.1 (in/yd) (Home Health) 1 x Per Day/30 Days Discharge Instructions: Secure with Kerlix as directed. Secured With: 9M Medipore H Soft Cloth Surgical T ape, 4 x 10 (in/yd) (Home Health) 1 x Per Day/30 Days Discharge Instructions: Secure with tape as directed. 1. Dakin's wet-to-dry dressings 2. Hydrofera Blue 3. Follow-up in 1 week 4. Mupirocin to any open areas that develop on the left lower extremity. Electronic Signature(s) Signed: 02/03/2022 3:54:49 PM By: Kalman Shan DO Entered By: Kalman Shan on 02/03/2022 15:52:22 -------------------------------------------------------------------------------- HxROS Details Patient Name: Date of Service: [name], Christine [name] 02/03/2022 1:30 PM Medical Record Number: [id number] Patient Account Number: [id number] Date of Birth/Sex: Treating RN: 1950-12-18 (71 y.o. [name] Primary Care Provider: Deland Pretty Other Clinician: Referring Provider: Treating Provider/Extender: Judie Grieve in Treatment: 10 Constitutional Symptoms (General Health) Medical History: Past Medical History Notes: Infarction of spleen Hematologic/Lymphatic Medical History: Past Medical History Notes: Hypothyroidism Cardiovascular Medical History: Positive for: Congestive Heart Failure; Hypertension Past Medical History Notes: A-Fib Gastrointestinal Medical History: Past Medical History Notes: Gastroesophageal reflux Genitourinary Medical History: Past Medical History Notes: Chronic kidney disease Musculoskeletal Medical History: Past Medical History Notes: Osteoarthritis of knee Psychiatric Medical History: Past Medical History Notes: Anxiety Immunizations Pneumococcal Vaccine: Received Pneumococcal Vaccination: No Implantable Devices No devices added Hospitalization / Surgery History Type of Hospitalization/Surgery Cellulitis left leg- 01/03/2022-01/07/2022 Electronic  Signature(s) Signed: 02/03/2022 3:54:49 PM By: Kalman Shan DO Signed: 02/03/2022 5:28:40 PM By: Deon Pilling RN, BSN Entered By: Kalman Shan on 02/03/2022 15:48:35 -------------------------------------------------------------------------------- SuperBill Details Patient Name: Date of Service: Christine Debar [name] 02/03/2022 Medical Record Number: [id number] Patient Account Number: [id number] Date of Birth/Sex: Treating RN: 07-16-1951 (71 y.o. [name] Primary Care Provider: Deland Pretty Other Clinician: Referring Provider: Treating Provider/Extender: Judie Grieve in Treatment: 10 Diagnosis Coding ICD-10 Codes Code Description [phone] Non-pressure chronic ulcer of other part of left lower leg with fat layer exposed T79.8XXA Other early complications of trauma, initial encounter I48.0 Paroxysmal atrial fibrillation Z79.01 Long term (current) use of anticoagulants Facility Procedures CPT4 Code: [id number] Description: 99214 - WOUND CARE VISIT-LEV 4 EST  PT Modifier: Quantity: 1 Physician Procedures : CPT4 Code Description Modifier [id number] - WC PHYS LEVEL 3 - EST PT ICD-10 Diagnosis Description L97.822 Non-pressure chronic ulcer of other part of left lower leg with fat layer exposed T79.8XXA Other early complications of trauma, initial  encounter I48.0 Paroxysmal atrial fibrillation Z79.01 Long term (current) use of anticoagulants Quantity: 1 Electronic Signature(s) Signed: 02/03/2022 3:54:49 PM By: Geralyn Corwin DO Entered By: Geralyn Corwin on 02/03/2022 15:52:41

## 2022-02-03 NOTE — Progress Notes (Signed)
LISEL, SIEGRIST ([id number]) Visit Report for 02/03/2022 Arrival Information Details Patient Name: Date of Service: [name] 02/03/2022 1:30 PM Medical Record Number: [id number] Patient Account Number: [id number] Date of Birth/Sex: Treating RN: February 15, 1951 (71 y.o. [name], Tammi Klippel Primary Care Erma Raiche: Deland Pretty Other Clinician: Referring Floy Riegler: Treating Adalbert Alberto/Extender: Judie Grieve in Treatment: 10 Visit Information History Since Last Visit Added or deleted any medications: Christine Patient Arrived: Wheel Chair Any new allergies or adverse reactions: Christine Arrival Time: 13:25 Had a fall or experienced change in Christine Accompanied By: self activities of daily living that may affect Transfer Assistance: None risk of falls: Patient Identification Verified: Yes Signs or symptoms of abuse/neglect since last visito Christine Secondary Verification Process Completed: Yes Hospitalized since last visit: Christine Patient Requires Transmission-Based Precautions: Christine Implantable device outside of the clinic excluding Christine Patient Has Alerts: Yes cellular tissue based products placed in the center Patient Alerts: Patient on Blood Thinner since last visit: Has Dressing in Place as Prescribed: Yes Pain Present Now: Christine Notes per patient iron infusion yesterday and another scheduled for next week. Electronic Signature(s) Signed: 02/03/2022 5:28:40 PM By: Deon Pilling RN, BSN Entered By: Deon Pilling on 02/03/2022 13:27:17 -------------------------------------------------------------------------------- Clinic Level of Care Assessment Details Patient Name: Date of Service: [name]. 02/03/2022 1:30 PM Medical Record Number: [id number] Patient Account Number: [id number] Date of Birth/Sex: Treating RN: 06-Mar-1951 (71 y.o. [name], [name] Primary Care Marigold Mom: Deland Pretty Other Clinician: Referring Marcel Gary: Treating Jailen Coward/Extender: Judie Grieve in Treatment: 10 Clinic Level of Care Assessment Items TOOL 4 Quantity Score X- 1 0 Use when only an EandM is performed on FOLLOW-UP visit ASSESSMENTS - Nursing Assessment / Reassessment X- 1 10 Reassessment of Co-morbidities (includes updates in patient status) X- 1 5 Reassessment of Adherence to Treatment Plan ASSESSMENTS - Wound and Skin A ssessment / Reassessment X - Simple Wound Assessment / Reassessment - one wound 1 5 _0  - 0 Complex Wound Assessment / Reassessment - multiple wounds X- 1 10 Dermatologic / Skin Assessment (not related to wound area) ASSESSMENTS - Focused Assessment X- 1 5 Circumferential Edema Measurements - multi extremities X- 1 10 Nutritional Assessment / Counseling / Intervention _1  - 0 Lower Extremity Assessment (monofilament, tuning fork, pulses) _2  - 0 Peripheral Arterial Disease Assessment (using hand held doppler) ASSESSMENTS - Ostomy and/or Continence Assessment and Care _3  - 0 Incontinence Assessment and Management _4  - 0 Ostomy Care Assessment and Management (repouching, etc.) PROCESS - Coordination of Care X - Simple Patient / Family Education for ongoing care 1 15 _5  - 0 Complex (extensive) Patient / Family Education for ongoing care X- 1 10 Staff obtains Programmer, systems, Records, T Results / Process Orders est X- 1 10 Staff telephones HHA, Nursing Homes / Clarify orders / etc _6  - 0 Routine Transfer to another Facility (non-emergent condition) _7  - 0 Routine Hospital Admission (non-emergent condition) _8  - 0 New Admissions / Biomedical engineer / Ordering NPWT Apligraf, etc. , _9  - 0 Emergency Hospital Admission (emergent condition) X- 1 10 Simple Discharge Coordination _10  - 0 Complex (extensive) Discharge Coordination PROCESS - Special Needs _11  - 0 Pediatric / Minor Patient Management _12  - 0 Isolation Patient Management _13  - 0 Hearing / Language / Visual special needs _14  - 0 Assessment of  Community assistance (transportation, D/C planning, etc.) _15  - 0 Additional assistance / Altered mentation _16  - 0 Support Surface(s) Assessment (bed, cushion, seat, etc.) INTERVENTIONS - Wound Cleansing /  Measurement X - Simple Wound Cleansing - one wound 1 5 _0  - 0 Complex Wound Cleansing - multiple wounds X- 1 5 Wound Imaging (photographs - any number of wounds) _1  - 0 Wound Tracing (instead of photographs) X- 1 5 Simple Wound Measurement - one wound _2  - 0 Complex Wound Measurement - multiple wounds INTERVENTIONS - Wound Dressings _3  - 0 Small Wound Dressing one or multiple wounds X- 1 15 Medium Wound Dressing one or multiple wounds _4  - 0 Large Wound Dressing one or multiple wounds <WGNFAOZHYQMVHQIO>_9<\/GEXBMWUXLKGMWNUU>_7  - 0 Application of Medications - topical <OZDGUYQIHKVQQVZD>_6<\/LOVFIEPPIRJJOACZ>_6  - 0 Application of Medications - injection INTERVENTIONS - Miscellaneous _7  - 0 External ear exam _8  - 0 Specimen Collection (cultures, biopsies, blood, body fluids, etc.) _9  - 0 Specimen(s) / Culture(s) sent or taken to Lab for analysis _10  - 0 Patient Transfer (multiple staff / Civil Service fast streamer / Similar devices) _11  - 0 Simple Staple / Suture removal (25 or less) _12  - 0 Complex Staple / Suture removal (26 or more) _13  - 0 Hypo / Hyperglycemic Management (close monitor of Blood Glucose) _14  - 0 Ankle / Brachial Index (ABI) - do not check if billed separately X- 1 5 Vital Signs Has the patient been seen at the hospital within the last three years: Yes Total Score: 125 Level Of Care: New/Established - Level 4 Electronic Signature(s) Signed: 02/03/2022 5:28:40 PM By: Deon Pilling RN, BSN Entered By: Deon Pilling on 02/03/2022 14:00:18 -------------------------------------------------------------------------------- Lower Extremity Assessment Details Patient Name: Date of Service: Christine Debar [name] 02/03/2022 1:30 PM Medical Record Number: [id number] Patient Account Number: [id number] Date of Birth/Sex: Treating RN: [birth year] (71 y.o. [name] Primary Care Sofie Schendel: Deland Pretty Other Clinician: Referring Sible Straley: Treating Ever Halberg/Extender: Judie Grieve in Treatment: 10 Edema Assessment Assessed: Shirlyn Goltz: Yes] Patrice Paradise: Christine] Edema: [Left: Ye] [Right: s] Calf Left: Right: Point of Measurement: 31 cm From Medial Instep 45.5 cm Ankle Left: Right: Point of Measurement: 9 cm From Medial Instep 26.5 cm Vascular Assessment Pulses: Dorsalis Pedis Palpable: [Left:Yes] Electronic Signature(s) Signed: 02/03/2022 5:28:40 PM By: Deon Pilling RN, BSN Entered By: Deon Pilling on 02/03/2022 13:31:52 -------------------------------------------------------------------------------- Multi Wound Chart Details Patient Name: Date of Service: Christine Debar [name] 02/03/2022 1:30 PM Medical Record Number: [id number] Patient Account Number: [id number] Date of Birth/Sex: Treating RN: 1951/03/08 (71 y.o. [name], [name] Primary Care Ledford Goodson: Deland Pretty Other Clinician: Referring Thelbert Gartin: Treating Crescentia Boutwell/Extender: Judie Grieve in Treatment: 10 Vital Signs Height(in): 67 Pulse(bpm): 58 Weight(lbs): 340 Blood Pressure(mmHg): 160/74 Body Mass Index(BMI): 53.2 Temperature(F): 98.8 Respiratory Rate(breaths/min): 22 Photos: [N/A:N/A] Left, Lateral Lower Leg N/A N/A Wound Location: Trauma N/A N/A Wounding Event: Trauma, Other N/A N/A Primary Etiology: Congestive Heart Failure, N/A N/A Comorbid History: Hypertension 10/18/2021 N/A N/A Date Acquired: 10 N/A N/A Weeks of Treatment: Open N/A N/A Wound Status: Christine N/A N/A Wound Recurrence: Yes N/A N/A Clustered Wound: 1 N/A N/A Clustered Quantity: 4.5x7.5x4 N/A N/A Measurements L x W x D (cm) 26.507 N/A N/A A (cm) : rea 106.029 N/A N/A Volume (cm) : 86.30% N/A N/A % Reduction in A rea: 93.60% N/A N/A % Reduction in Volume: 12 Position 1 (o'clock): 8.5 Maximum Distance 1 (cm): 9 Starting Position 1  (o'clock): 12 Ending Position 1 (o'clock): 2.5 Maximum Distance 1 (cm): Yes N/A N/A Tunneling: Yes N/A N/A Undermining: Full Thickness With Exposed Support N/A N/A Classification: Structures Medium N/A N/A Exudate Amount: Serosanguineous N/A N/A Exudate Type: red, brown N/A N/A Exudate  Color: Flat and Intact N/A N/A Wound Margin: Large (67-100%) N/A N/A Granulation Amount: Red, Friable N/A N/A Granulation Quality: Small (1-33%) N/A N/A Necrotic Amount: Fat Layer (Subcutaneous Tissue): Yes N/A N/A Exposed Structures: Muscle: Yes Fascia: Christine Tendon: Christine Joint: Christine Bone: Christine Small (1-33%) N/A N/A Epithelialization: Treatment Notes Electronic Signature(s) Signed: 02/03/2022 3:54:49 PM By: Kalman Shan DO Signed: 02/03/2022 5:28:40 PM By: Deon Pilling RN, BSN Entered By: Kalman Shan on 02/03/2022 15:47:05 -------------------------------------------------------------------------------- Multi-Disciplinary Care Plan Details Patient Name: Date of Service: [name], Christine Blinks [name] 02/03/2022 1:30 PM Medical Record Number: [id number] Patient Account Number: [id number] Date of Birth/Sex: Treating RN: 10-11-1950 (71 y.o. [name], [name] Primary Care Loan Oguin: Deland Pretty Other Clinician: Referring Janira Mandell: Treating Kriston Pasquarello/Extender: Judie Grieve in Treatment: 10 Active Inactive Abuse / Safety / Falls / Self Care Management Nursing Diagnoses: History of Falls Potential for falls Goals: Patient/caregiver will verbalize/demonstrate measure taken to improve self care Date Initiated: 11/19/2021 Target Resolution Date: 02/18/2022 Goal Status: Active Patient/caregiver will verbalize/demonstrate measures taken to prevent injury and/or falls Date Initiated: 11/19/2021 Target Resolution Date: 02/18/2022 Goal Status: Active Interventions: Provide education on basic hygiene Provide education on fall prevention Provide education on HBO  safety Provide education on vaccinations Notes: Necrotic Tissue Nursing Diagnoses: Impaired tissue integrity related to necrotic/devitalized tissue Goals: Necrotic/devitalized tissue will be minimized in the wound bed Date Initiated: 11/19/2021 Target Resolution Date: 02/18/2022 Goal Status: Active Patient/caregiver will verbalize understanding of reason and process for debridement of necrotic tissue Date Initiated: 11/19/2021 Target Resolution Date: 02/18/2022 Goal Status: Active Interventions: Assess patient pain level pre-, during and post procedure and prior to discharge Provide education on necrotic tissue and debridement process Treatment Activities: Apply topical anesthetic as ordered : 11/19/2021 Excisional debridement : 11/19/2021 Notes: Pain, Acute or Chronic Nursing Diagnoses: Pain, acute or chronic: actual or potential Potential alteration in comfort, pain Goals: Patient will verbalize adequate pain control and receive pain control interventions during procedures as needed Date Initiated: 11/19/2021 Target Resolution Date: 02/18/2022 Goal Status: Active Patient/caregiver will verbalize comfort level met Date Initiated: 11/19/2021 Target Resolution Date: 02/18/2022 Goal Status: Active Interventions: Encourage patient to take pain medications as prescribed Provide education on pain management Reposition patient for comfort Treatment Activities: Administer pain control measures as ordered : 11/19/2021 Notes: Electronic Signature(s) Signed: 02/03/2022 5:28:40 PM By: Deon Pilling RN, BSN Entered By: Deon Pilling on 02/03/2022 13:42:16 -------------------------------------------------------------------------------- Pain Assessment Details Patient Name: Date of Service: Christine Debar [name] 02/03/2022 1:30 PM Medical Record Number: [id number] Patient Account Number: [id number] Date of Birth/Sex: Treating RN: 12/24/1950 (71 y.o. [name] Primary Care Bauer Ausborn:  Deland Pretty Other Clinician: Referring Theressa Piedra: Treating Micala Saltsman/Extender: Judie Grieve in Treatment: 10 Active Problems Location of Pain Severity and Description of Pain Patient Has Paino Christine Site Locations Rate the pain. Current Pain Level: 0 Pain Management and Medication Current Pain Management: Medication: Christine Cold Application: Christine Rest: Christine Massage: Christine Activity: Christine T.E.N.S.: Christine Heat Application: Christine Leg drop or elevation: Christine Is the Current Pain Management Adequate: Adequate How does your wound impact your activities of daily livingo Sleep: Christine Bathing: Christine Appetite: Christine Relationship With Others: Christine Bladder Continence: Christine Emotions: Christine Bowel Continence: Christine Work: Christine Toileting: Christine Drive: Christine Dressing: Christine Hobbies: Christine Engineer, maintenance) Signed: 02/03/2022 5:28:40 PM By: Deon Pilling RN, BSN Entered By: Deon Pilling on 02/03/2022 13:27:41 -------------------------------------------------------------------------------- Patient/Caregiver Education Details Patient Name: Date of Service: [name], Christine [name] 6/15/2023andnbsp1:30 PM Medical Record  Number: [id number] Patient Account Number: [id number] Date of Birth/Gender: Treating RN: Nov 05, 1950 (71 y.o. [name] Primary Care Physician: Deland Pretty Other Clinician: Referring Physician: Treating Physician/Extender: Judie Grieve in Treatment: 10 Education Assessment Education Provided To: Patient Education Topics Provided Wound Debridement: Handouts: Wound Debridement Methods: Explain/Verbal Responses: State content correctly Electronic Signature(s) Signed: 02/03/2022 5:28:40 PM By: Deon Pilling RN, BSN Entered By: Deon Pilling on 02/03/2022 13:42:28 -------------------------------------------------------------------------------- Wound Assessment Details Patient Name: Date of Service: Christine Debar [name] 02/03/2022 1:30 PM Medical Record Number:  [id number] Patient Account Number: [id number] Date of Birth/Sex: Treating RN: 1950/09/11 (71 y.o. [name], [name] Primary Care Lonisha Bobby: Deland Pretty Other Clinician: Referring Janalee Grobe: Treating Felix Pratt/Extender: Judie Grieve in Treatment: 10 Wound Status Wound Number: 1 Primary Etiology: Trauma, Other Wound Location: Left, Lateral Lower Leg Wound Status: Open Wounding Event: Trauma Comorbid History: Congestive Heart Failure, Hypertension Date Acquired: 10/18/2021 Weeks Of Treatment: 10 Clustered Wound: Yes Photos Wound Measurements Length: (cm) 4.5 Width: (cm) 7.5 Depth: (cm) 4 Clustered Quantity: 1 Area: (cm) 26.507 Volume: (cm) 106.029 % Reduction in Area: 86.3% % Reduction in Volume: 93.6% Epithelialization: Small (1-33%) Tunneling: Yes Position (o'clock): 12 Maximum Distance: (cm) 8.5 Undermining: Yes Starting Position (o'clock): 9 Ending Position (o'clock): 12 Maximum Distance: (cm) 2.5 Wound Description Classification: Full Thickness With Exposed Support Structures Wound Margin: Flat and Intact Exudate Amount: Medium Exudate Type: Serosanguineous Exudate Color: red, brown Foul Odor After Cleansing: Christine Slough/Fibrino Yes Wound Bed Granulation Amount: Large (67-100%) Exposed Structure Granulation Quality: Red, Friable Fascia Exposed: Christine Necrotic Amount: Small (1-33%) Fat Layer (Subcutaneous Tissue) Exposed: Yes Necrotic Quality: Adherent Slough Tendon Exposed: Christine Muscle Exposed: Yes Necrosis of Muscle: Christine Joint Exposed: Christine Bone Exposed: Christine Electronic Signature(s) Signed: 02/03/2022 5:28:40 PM By: Deon Pilling RN, BSN Entered By: Deon Pilling on 02/03/2022 13:36:09 -------------------------------------------------------------------------------- Vitals Details Patient Name: Date of Service: [name], Christine [name] 02/03/2022 1:30 PM Medical Record Number: [id number] Patient Account Number: [id number] Date of  Birth/Sex: Treating RN: Mar 20, 1951 (71 y.o. [name], [name] Primary Care Izaiyah Kleinman: Deland Pretty Other Clinician: Referring Maddax Palinkas: Treating Romeka Scifres/Extender: Judie Grieve in Treatment: 10 Vital Signs Time Taken: 13:27 Temperature (F): 98.8 Height (in): 67 Pulse (bpm): 58 Weight (lbs): 340 Respiratory Rate (breaths/min): 22 Body Mass Index (BMI): 53.2 Blood Pressure (mmHg): 160/74 Reference Range: 80 - 120 mg / dl Electronic Signature(s) Signed: 02/03/2022 5:28:40 PM By: Deon Pilling RN, BSN Entered By: Deon Pilling on 02/03/2022 13:27:32

## 2022-02-04 DIAGNOSIS — I5032 Chronic diastolic (congestive) heart failure: Secondary | ICD-10-CM | POA: Diagnosis not present

## 2022-02-04 DIAGNOSIS — L03116 Cellulitis of left lower limb: Secondary | ICD-10-CM | POA: Diagnosis not present

## 2022-02-04 DIAGNOSIS — T24332D Burn of third degree of left lower leg, subsequent encounter: Secondary | ICD-10-CM | POA: Diagnosis not present

## 2022-02-04 DIAGNOSIS — I110 Hypertensive heart disease with heart failure: Secondary | ICD-10-CM | POA: Diagnosis not present

## 2022-02-07 DIAGNOSIS — L97822 Non-pressure chronic ulcer of other part of left lower leg with fat layer exposed: Secondary | ICD-10-CM | POA: Diagnosis not present

## 2022-02-09 DIAGNOSIS — D500 Iron deficiency anemia secondary to blood loss (chronic): Secondary | ICD-10-CM | POA: Diagnosis not present

## 2022-02-11 ENCOUNTER — Encounter (HOSPITAL_BASED_OUTPATIENT_CLINIC_OR_DEPARTMENT_OTHER): Payer: PPO | Admitting: Internal Medicine

## 2022-02-11 DIAGNOSIS — T798XXA Other early complications of trauma, initial encounter: Secondary | ICD-10-CM | POA: Diagnosis not present

## 2022-02-11 DIAGNOSIS — Z7901 Long term (current) use of anticoagulants: Secondary | ICD-10-CM | POA: Diagnosis not present

## 2022-02-11 DIAGNOSIS — I480 Paroxysmal atrial fibrillation: Secondary | ICD-10-CM

## 2022-02-11 DIAGNOSIS — L97822 Non-pressure chronic ulcer of other part of left lower leg with fat layer exposed: Secondary | ICD-10-CM | POA: Diagnosis not present

## 2022-02-17 ENCOUNTER — Encounter (HOSPITAL_BASED_OUTPATIENT_CLINIC_OR_DEPARTMENT_OTHER): Payer: PPO | Admitting: Internal Medicine

## 2022-02-17 DIAGNOSIS — L97822 Non-pressure chronic ulcer of other part of left lower leg with fat layer exposed: Secondary | ICD-10-CM | POA: Diagnosis not present

## 2022-02-17 NOTE — Progress Notes (Signed)
AAVA, DELAND ([id number]) Visit Report for 02/17/2022 Arrival Information Details Patient Name: Date of Service: [name] 02/17/2022 1:30 PM Medical Record Number: [id number] Patient Account Number: [id number] Date of Birth/Sex: Treating RN: 1951-05-03 (71 y.o. [name], Tammi Klippel Primary Care Ashni Lonzo: Deland Pretty Other Clinician: Referring Aerie Donica: Treating Mackinzie Vuncannon/Extender: Judie Grieve in Treatment: 12 Visit Information History Since Last Visit Added or deleted any medications: No Patient Arrived: Wheel Chair Any new allergies or adverse reactions: No Arrival Time: 13:45 Had a fall or experienced change in No Accompanied By: self activities of daily living that may affect Transfer Assistance: None risk of falls: Patient Identification Verified: Yes Signs or symptoms of abuse/neglect since last visito No Secondary Verification Process Completed: Yes Hospitalized since last visit: No Patient Requires Transmission-Based Precautions: No Implantable device outside of the clinic excluding No Patient Has Alerts: Yes cellular tissue based products placed in the center Patient Alerts: Patient on Blood Thinner since last visit: Has Dressing in Place as Prescribed: Yes Pain Present Now: No Electronic Signature(s) Signed: 02/17/2022 5:00:34 PM By: Deon Pilling RN, BSN Entered By: Deon Pilling on 02/17/2022 13:49:00 -------------------------------------------------------------------------------- Encounter Discharge Information Details Patient Name: Date of Service: Christine Debar [name] 02/17/2022 1:30 PM Medical Record Number: [id number] Patient Account Number: [id number] Date of Birth/Sex: Treating RN: [birth year] (71 y.o. Debby [name] Primary Care Twanda Stakes: Deland Pretty Other Clinician: Referring Khiem Gargis: Treating Aubri Gathright/Extender: Judie Grieve in Treatment: 12 Encounter Discharge Information  Items Discharge Condition: Stable Ambulatory Status: Wheelchair Discharge Destination: Home Transportation: Private Auto Accompanied By: self Schedule Follow-up Appointment: Yes Clinical Summary of Care: Electronic Signature(s) Signed: 02/17/2022 5:00:34 PM By: Deon Pilling RN, BSN Entered By: Deon Pilling on 02/17/2022 14:21:01 -------------------------------------------------------------------------------- Lower Extremity Assessment Details Patient Name: Date of Service: Christine Debar [name] 02/17/2022 1:30 PM Medical Record Number: [id number] Patient Account Number: [id number] Date of Birth/Sex: Treating RN: 1951-05-09 (71 y.o. Debby [name] Primary Care Havah Ammon: Deland Pretty Other Clinician: Referring Blessin Kanno: Treating Norrine Ballester/Extender: Judie Grieve in Treatment: 12 Edema Assessment Assessed: Shirlyn Goltz: Yes] Patrice Paradise: No] Edema: [Left: Ye] [Right: s] Calf Left: Right: Point of Measurement: 31 cm From Medial Instep 43 cm Ankle Left: Right: Point of Measurement: 9 cm From Medial Instep 26 cm Electronic Signature(s) Signed: 02/17/2022 5:00:34 PM By: Deon Pilling RN, BSN Entered By: Deon Pilling on 02/17/2022 13:54:24 -------------------------------------------------------------------------------- Multi Wound Chart Details Patient Name: Date of Service: Christine Debar [name] 02/17/2022 1:30 PM Medical Record Number: [id number] Patient Account Number: [id number] Date of Birth/Sex: Treating RN: [birth year] (71 y.o. Debby [name] Primary Care Rasheem Figiel: Deland Pretty Other Clinician: Referring Nirvaan Frett: Treating Shraga Custard/Extender: Judie Grieve in Treatment: 12 Vital Signs Height(in): 67 Pulse(bpm): 49 Weight(lbs): 340 Blood Pressure(mmHg): 145/75 Body Mass Index(BMI): 53.2 Temperature(F): 98.7 Respiratory Rate(breaths/min): 20 Photos: [N/A:N/A] Left, Lateral Lower Leg N/A N/A Wound Location: Trauma N/A  N/A Wounding Event: Trauma, Other N/A N/A Primary Etiology: Congestive Heart Failure, N/A N/A Comorbid History: Hypertension 10/18/2021 N/A N/A Date Acquired: 12 N/A N/A Weeks of Treatment: Open N/A N/A Wound Status: No N/A N/A Wound Recurrence: Yes N/A N/A Clustered Wound: 1 N/A N/A Clustered Quantity: 2.8x5.3x2.8 N/A N/A Measurements L x W x D (cm) 11.655 N/A N/A A (cm) : rea 32.635 N/A N/A Volume (cm) : 94.00% N/A N/A % Reduction in A rea: 98.00% N/A N/A % Reduction in Volume: 12 Position 1 (o'clock): 7.8 Maximum Distance 1 (cm): Yes N/A N/A Tunneling:  Full Thickness With Exposed Support N/A N/A Classification: Structures Medium N/A N/A Exudate Amount: Sanguinous N/A N/A Exudate Type: red N/A N/A Exudate Color: Distinct, outline attached N/A N/A Wound Margin: Large (67-100%) N/A N/A Granulation Amount: Red, Friable N/A N/A Granulation Quality: None Present (0%) N/A N/A Necrotic Amount: Fat Layer (Subcutaneous Tissue): Yes N/A N/A Exposed Structures: Muscle: Yes Fascia: No Tendon: No Joint: No Bone: No Small (1-33%) N/A N/A Epithelialization: Chemical Cauterization N/A N/A Procedures Performed: Treatment Notes Wound #1 (Lower Leg) Wound Laterality: Left, Lateral Cleanser Wound Cleanser Discharge Instruction: Cleanse the wound with wound cleanser prior to applying a clean dressing using gauze sponges, not tissue or cotton balls. Peri-Wound Care Sween Lotion (Moisturizing lotion) Discharge Instruction: Apply moisturizing lotion to left leg with dressing. Topical Primary Dressing PolyMem Silver Non-Adhesive Dressing, 4.25x4.25 in Discharge Instruction: Apply to wound bed as instructed Plain packing strip 1/4 (in) Discharge Instruction: Lightly pack as instructed Dakin's Solution 0.25%, 16 (oz) Discharge Instruction: Moisten gauze with Dakin's solution Secondary Dressing ABD Pad, 8x10 Discharge Instruction: Apply over primary dressing  as directed. Woven Gauze Sponge, Non-Sterile 4x4 in Discharge Instruction: Apply over primary dressing as directed. Secured With Elastic Bandage 4 inch (ACE bandage) Discharge Instruction: Secure with ACE bandage as directed. Kerlix Roll Sterile, 4.5x3.1 (in/yd) Discharge Instruction: Secure with Kerlix as directed. 10M Medipore H Soft Cloth Surgical T ape, 4 x 10 (in/yd) Discharge Instruction: Secure with tape as directed. Compression Wrap Compression Stockings Add-Ons Electronic Signature(s) Signed: 02/17/2022 3:54:07 PM By: Kalman Shan DO Signed: 02/17/2022 5:00:34 PM By: Deon Pilling RN, BSN Signed: 02/17/2022 5:00:34 PM By: Deon Pilling RN, BSN Entered By: Kalman Shan on 02/17/2022 15:22:14 -------------------------------------------------------------------------------- Multi-Disciplinary Care Plan Details Patient Name: Date of Service: Christine Debar [name] 02/17/2022 1:30 PM Medical Record Number: [id number] Patient Account Number: [id number] Date of Birth/Sex: Treating RN: Apr 30, 1951 (71 y.o. [name], Meta.Reding Primary Care Parris Signer: Deland Pretty Other Clinician: Referring Tejas Seawood: Treating Delorus Langwell/Extender: Judie Grieve in Treatment: 12 Active Inactive Abuse / Safety / Falls / Self Care Management Nursing Diagnoses: History of Falls Potential for falls Goals: Patient/caregiver will verbalize/demonstrate measure taken to improve self care Date Initiated: 11/19/2021 Target Resolution Date: 03/18/2022 Goal Status: Active Patient/caregiver will verbalize/demonstrate measures taken to prevent injury and/or falls Date Initiated: 11/19/2021 Target Resolution Date: 03/18/2022 Goal Status: Active Interventions: Provide education on basic hygiene Provide education on fall prevention Provide education on HBO safety Provide education on vaccinations Notes: Pain, Acute or Chronic Nursing Diagnoses: Pain, acute or chronic: actual or  potential Potential alteration in comfort, pain Goals: Patient will verbalize adequate pain control and receive pain control interventions during procedures as needed Date Initiated: 11/19/2021 Target Resolution Date: 03/18/2022 Goal Status: Active Patient/caregiver will verbalize comfort level met Date Initiated: 11/19/2021 Target Resolution Date: 03/18/2022 Goal Status: Active Interventions: Encourage patient to take pain medications as prescribed Provide education on pain management Reposition patient for comfort Treatment Activities: Administer pain control measures as ordered : 11/19/2021 Notes: Electronic Signature(s) Signed: 02/17/2022 5:00:34 PM By: Deon Pilling RN, BSN Entered By: Deon Pilling on 02/17/2022 14:15:23 -------------------------------------------------------------------------------- Pain Assessment Details Patient Name: Date of Service: Christine Debar [name] 02/17/2022 1:30 PM Medical Record Number: [id number] Patient Account Number: [id number] Date of Birth/Sex: Treating RN: [birth year] (71 y.o. Debby [name] Primary Care Sami Roes: Deland Pretty Other Clinician: Referring Louetta Hollingshead: Treating Jarmon Javid/Extender: Judie Grieve in Treatment: 12 Active Problems Location of Pain Severity and Description of Pain Patient Has Paino No Site  Locations Rate the pain. Current Pain Level: 0 Pain Management and Medication Current Pain Management: Medication: No Cold Application: No Rest: No Massage: No Activity: No T.E.N.S.: No Heat Application: No Leg drop or elevation: No Is the Current Pain Management Adequate: Adequate How does your wound impact your activities of daily livingo Sleep: No Bathing: No Appetite: No Relationship With Others: No Bladder Continence: No Emotions: No Bowel Continence: No Work: No Toileting: No Drive: No Dressing: No Hobbies: No Engineer, maintenance) Signed: 02/17/2022 5:00:34 PM By: Deon Pilling RN, BSN Entered By: Deon Pilling on 02/17/2022 13:50:05 -------------------------------------------------------------------------------- Patient/Caregiver Education Details Patient Name: Date of Service: [name] 6/29/2023andnbsp1:30 PM Medical Record Number: [id number] Patient Account Number: [id number] Date of Birth/Gender: Treating RN: 08-24-1950 (71 y.o. Debby [name] Primary Care Physician: Deland Pretty Other Clinician: Referring Physician: Treating Physician/Extender: Judie Grieve in Treatment: 12 Education Assessment Education Provided To: Patient Education Topics Provided Wound/Skin Impairment: Handouts: Skin Care Do's and Dont's Methods: Explain/Verbal Responses: Reinforcements needed Electronic Signature(s) Signed: 02/17/2022 5:00:34 PM By: Deon Pilling RN, BSN Entered By: Deon Pilling on 02/17/2022 14:15:38 -------------------------------------------------------------------------------- Wound Assessment Details Patient Name: Date of Service: [name] 02/17/2022 1:30 PM Medical Record Number: [id number] Patient Account Number: [id number] Date of Birth/Sex: Treating RN: 1951-08-04 (71 y.o. [name], Meta.Reding Primary Care Kirsten Spearing: Deland Pretty Other Clinician: Referring Jerilee Space: Treating Graysin Luczynski/Extender: Judie Grieve in Treatment: 12 Wound Status Wound Number: 1 Primary Etiology: Trauma, Other Wound Location: Left, Lateral Lower Leg Wound Status: Open Wounding Event: Trauma Comorbid History: Congestive Heart Failure, Hypertension Date Acquired: 10/18/2021 Weeks Of Treatment: 12 Clustered Wound: Yes Photos Wound Measurements Length: (cm) 2.8 Width: (cm) 5.3 Depth: (cm) 2.8 Clustered Quantity: 1 Area: (cm) 11.655 Volume: (cm) 32.635 % Reduction in Area: 94% % Reduction in Volume: 98% Epithelialization: Small (1-33%) Tunneling: Yes Position (o'clock): 12 Maximum  Distance: (cm) 7.8 Undermining: No Wound Description Classification: Full Thickness With Exposed Support Structu Wound Margin: Distinct, outline attached Exudate Amount: Medium Exudate Type: Sanguinous Exudate Color: red Wound Bed Granulation Amount: Large (67-100%) Granulation Quality: Red, Friable Necrotic Amount: None Present (0%) res Foul Odor After Cleansing: No Slough/Fibrino No Exposed Structure Fascia Exposed: No Fat Layer (Subcutaneous Tissue) Exposed: Yes Tendon Exposed: No Muscle Exposed: Yes Necrosis of Muscle: No Joint Exposed: No Bone Exposed: No Treatment Notes Wound #1 (Lower Leg) Wound Laterality: Left, Lateral Cleanser Wound Cleanser Discharge Instruction: Cleanse the wound with wound cleanser prior to applying a clean dressing using gauze sponges, not tissue or cotton balls. Peri-Wound Care Sween Lotion (Moisturizing lotion) Discharge Instruction: Apply moisturizing lotion to left leg with dressing. Topical Primary Dressing PolyMem Silver Non-Adhesive Dressing, 4.25x4.25 in Discharge Instruction: Apply to wound bed as instructed Plain packing strip 1/4 (in) Discharge Instruction: Lightly pack as instructed Dakin's Solution 0.25%, 16 (oz) Discharge Instruction: Moisten gauze with Dakin's solution Secondary Dressing ABD Pad, 8x10 Discharge Instruction: Apply over primary dressing as directed. Woven Gauze Sponge, Non-Sterile 4x4 in Discharge Instruction: Apply over primary dressing as directed. Secured With Elastic Bandage 4 inch (ACE bandage) Discharge Instruction: Secure with ACE bandage as directed. Kerlix Roll Sterile, 4.5x3.1 (in/yd) Discharge Instruction: Secure with Kerlix as directed. 32M Medipore H Soft Cloth Surgical T ape, 4 x 10 (in/yd) Discharge Instruction: Secure with tape as directed. Compression Wrap Compression Stockings Add-Ons Electronic Signature(s) Signed: 02/17/2022 5:00:34 PM By: Deon Pilling RN, BSN Entered By: Deon Pilling on 02/17/2022 13:57:43 -------------------------------------------------------------------------------- Vitals Details Patient Name: Date  of Service: Christine Debar [name] 02/17/2022 1:30 PM Medical Record Number: [id number] Patient Account Number: [id number] Date of Birth/Sex: Treating RN: May 17, 1951 (71 y.o. [name], Meta.Reding Primary Care Maryfrances Portugal: Deland Pretty Other Clinician: Referring Colyn Miron: Treating Callie Bunyard/Extender: Judie Grieve in Treatment: 12 Vital Signs Time Taken: 13:45 Temperature (F): 98.7 Height (in): 67 Pulse (bpm): 49 Weight (lbs): 340 Respiratory Rate (breaths/min): 20 Body Mass Index (BMI): 53.2 Blood Pressure (mmHg): 145/75 Reference Range: 80 - 120 mg / dl Notes per patient asymptomatic with low heart rate of 49. Rechecked manually and 49 is accurate. Electronic Signature(s) Signed: 02/17/2022 5:00:34 PM By: Deon Pilling RN, BSN Entered By: Deon Pilling on 02/17/2022 13:50:55

## 2022-02-17 NOTE — Progress Notes (Signed)
MAMA, PODOLAK ([id number]) Visit Report for 02/17/2022 Chief Complaint Document Details Patient Name: Date of Service: [name] 02/17/2022 1:30 PM Medical Record Number: [id number] Patient Account Number: [id number] Date of Birth/Sex: Treating RN: 10-Mar-1951 (71 y.o. [name] Primary Care Provider: Deland Pretty Other Clinician: Referring Provider: Treating Provider/Extender: Judie Grieve in Treatment: 12 Information Obtained from: Patient Chief Complaint 11/19/2021; Left lower extremity wound status post fall Electronic Signature(s) Signed: 02/17/2022 3:54:07 PM By: Kalman Shan DO Entered By: Kalman Shan on 02/17/2022 15:22:21 -------------------------------------------------------------------------------- HPI Details Patient Name: Date of Service: Christine Debar [name] 02/17/2022 1:30 PM Medical Record Number: [id number] Patient Account Number: [id number] Date of Birth/Sex: Treating RN: 05-Jul-1951 (71 y.o. [name] Primary Care Provider: Deland Pretty Other Clinician: Referring Provider: Treating Provider/Extender: Judie Grieve in Treatment: 12 History of Present Illness HPI Description: Admission 11/19/2021 Ms. Lizmari Toomey is a 71 year old female with a past medical history of paroxysmal A-fib on Eliquis, hypothyroidism, major depressive disorder, venous insufficiency and chronic diastolic heart failure that presents to the clinic for a 1 month history of nonhealing wound to the left lower extremity. She visited the ED on 10/18/2021 after a mechanical fall. She developed a hematoma that subsequently opened. She was hospitalized for 7 days and discharged on 10/25/2021. She has been on several different antibiotics For the past month. She states that most recently she was on Levaquin and linezolid for the past week. She states she completes her antibiotic course tomorrow. She has been using  Dakin's wet-to-dry dressings to the wound bed. She denies signs of infection. 4/7; patient presents for follow-up. She has been using Dakin's wet-to-dry dressings. She did end up going to the ED on 4/1 because she had excess bleeding with dressing change that she could not stop. She is on Eliquis for A-fib. In the ED they tied off a small artery. She has had no issues since discharge. She denies signs of infection. 4/14; this is a very difficult clinical situation. A patient with underlying chronic venous insufficiency and lymphedema very significant lower extremity edema had a hematoma after a fall on her left upper lateral lower leg. She is on Eliquis for atrial fibrillation apparently with a history of a splenic infarct following with Dr. Jolyn Nap of cardiology. She has exhibited significant bleeding from the wound surface including ao Venous bleeder that required suturing short while ago. She has been using Dakin's wet-to-dry packing and over the surface of the wound. She saw Dr. Caryl Comes yesterday he is reluctant to consider stopping the Eliquis because of the prior history of presumed cardioembolism. Wants to communicate with Dr. Heber [place] when she returns. In a perfect world where she was not on Eliquis she requires a wound VAC with additional compression wraps but I understand the reluctance to do this because of the concerns of bleeding 4/28; the patient's wound actually looks better today using Dakin's wet-to-dry that she is changing twice a day she is wrapping this with Kerlix and Ace wrapping. She tells me she had 2 small bleeding areas which were part of the superficial wound that stopped this week with direct pressure. Other than that no major issues. In follow-up from discussion of last week Dr. 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 The bigger question  from my point of view is the wound VAC issue. As far as she knows and her daughter-in-law verifies that she has not had any bleeding from the deeper parts of the wound although the bleeding has been superficial including the one that sent her to the ER for stitches. She is concerned that a wound VAC would cause further bleeding and I cannot completely allay those concerns. 5/8; patient presents for follow-up. She has no issues or complaints today. She has been using Dakin's wet-to-dry dressings without issues. She denies signs of infection. 5/12; patient presents for follow-up. She continues to use Dakin's wet-to-dry dressings without issues. She denies signs of infection. She reports some issues with bleeding at times but this has improved. She denies signs of infection. 6/1; patient presents for follow-up. She was recently hospitalized for upper left leg thigh cellulitis. She was given IV cefepime and vancomycin and discharged on oral antibiotics. She has been using Dakin's wet-to-dry dressings to the left lower leg wound. She reports improvement in healing. She currently denies systemic signs of infection. 6/7; patient is using Dakin's wet-to-dry twice daily. In general this looks better than when I saw this a month or so ago however still considerable depth to the tunnel in her left leg. She is going for iron infusions ordered by her primary care doctor I believe 6/15; patient presents for follow-up. She has been using Dakin's wet-to-dry dressings. She has noted some scattered small areas that blister up and heal on her left lower leg. She has been using mupirocin ointment on them. Nothing open today. 6/23; patient's been using Dakin's wet-to-dry dressings to the tunneled wound and Hydrofera Blue to the opening. She has no issues or complaints today. 6/29; patient presents for follow-up. She has been using Dakin's wet-to-dry dressings to the tunneled wound  and Hydrofera Blue to the opening. She states that [facility] is sticking to the wound bed. She denies signs of infection. Electronic Signature(s) Signed: 02/17/2022 3:54:07 PM By: Kalman Shan DO Entered By: Kalman Shan on 02/17/2022 15:22:50 -------------------------------------------------------------------------------- Chemical Cauterization Details Patient Name: Date of Service: Christine Debar [name] 02/17/2022 1:30 PM Medical Record Number: [id number] Patient Account Number: [id number] Date of Birth/Sex: Treating RN: February 18, 1951 (71 y.o. [name] Primary Care Provider: Deland Pretty Other Clinician: Referring Provider: Treating Provider/Extender: Judie Grieve in Treatment: 12 Procedure Performed for: Wound #1 Left,Lateral Lower Leg Performed By: Physician Kalman Shan, DO Post Procedure Diagnosis Same as Pre-procedure Notes silver nitrate used. Electronic Signature(s) Signed: 02/17/2022 3:54:07 PM By: Kalman Shan DO Signed: 02/17/2022 5:00:34 PM By: Deon Pilling RN, BSN Entered By: Deon Pilling on 02/17/2022 14:16:30 -------------------------------------------------------------------------------- Physical Exam Details Patient Name: Date of Service: Christine Debar [name] 02/17/2022 1:30 PM Medical Record Number: [id number] Patient Account Number: [id number] Date of Birth/Sex: Treating RN: 26-Oct-1950 (71 y.o. [name] Primary Care Provider: Deland Pretty Other Clinician: Referring Provider: Treating Provider/Extender: Judie Grieve in Treatment: 12 Constitutional respirations regular, non-labored and within target range for patient.. Cardiovascular 2+ dorsalis pedis/posterior tibialis pulses. Psychiatric pleasant and cooperative. Notes Left lower extremity: Open wound with hypergranulated tissue to the surrounding opening. Increased depth and tunneling within. No surrounding signs  of infection. Electronic Signature(s) Signed: 02/17/2022 3:54:07 PM By: Kalman Shan DO Entered By: Kalman Shan on 02/17/2022 15:24:23 -------------------------------------------------------------------------------- Physician Orders Details Patient Name: Date of Service: Christine Debar [name] 02/17/2022 1:30 PM Medical Record Number: [id number] Patient Account Number: [id number] Date of Birth/Sex: Treating  RN: 1950/11/18 (71 y.o. [name] Primary Care Provider: [name] Other Clinician: Referring Provider: Treating Provider/Extender: Annamary Rummage in Treatment: 12 Verbal / Phone Orders: No Diagnosis Coding ICD-10 Coding Code Description [phone] Non-pressure chronic ulcer of other part of left lower leg with fat layer exposed T79.8XXA Other early complications of trauma, initial encounter I48.0 Paroxysmal atrial fibrillation Z79.01 Long term (current) use of anticoagulants Follow-up Appointments ppointment in 1 week. - Dr. Mikey Bussing and [place], Room 8 Friday 02/25/2022 130pm Return A ppointment in 2 weeks. - Dr. Mikey Bussing and [place], Room 8 03/03/2022 215pm Return A Other: - Patient to call [facility] to be fitted for compression stockings. Address [address]. [place]; Phone #[phone]. Call them to make an appt time. Bathing/ Shower/ Hygiene May shower with protection but do not get wound dressing(s) wet. Edema Control - Lymphedema / SCD / Other Elevate legs to the level of the heart or above for 30 minutes daily and/or when sitting, a frequency of: - 3-4 times a day throughout the day. Avoid standing for long periods of time. Compression stocking or Garment 20-30 mm/Hg pressure to: - patient to purchase and wear apply in the morning and remove at night. no ace wrap when wearing. Home Health No change in wound care orders this week; continue Home Health for wound care. May utilize formulary equivalent dressing for wound treatment  orders unless otherwise specified. - 2x a week dressing changes and all other days family member to change. Other Home Health Orders/Instructions: - [facility] Wound Treatment Wound #1 - Lower Leg Wound Laterality: Left, Lateral Cleanser: Wound Cleanser (Home Health) 1 x Per Day/30 Days Discharge Instructions: Cleanse the wound with wound cleanser prior to applying a clean dressing using gauze sponges, not tissue or cotton balls. Peri-Wound Care: Sween Lotion (Moisturizing lotion) 1 x Per Day/30 Days Discharge Instructions: Apply moisturizing lotion to left leg with dressing. Prim Dressing: PolyMem Silver Non-Adhesive Dressing, 4.25x4.25 in (Home Health) 1 x Per Day/30 Days ary Discharge Instructions: Apply to wound bed as instructed Prim Dressing: Plain packing strip 1/4 (in) 1 x Per Day/30 Days ary Discharge Instructions: Lightly pack as instructed Prim Dressing: Dakin's Solution 0.25%, 16 (oz) (Home Health) 1 x Per Day/30 Days ary Discharge Instructions: Moisten gauze with Dakin's solution Secondary Dressing: ABD Pad, 8x10 (Home Health) 1 x Per Day/30 Days Discharge Instructions: Apply over primary dressing as directed. Secondary Dressing: Woven Gauze Sponge, Non-Sterile 4x4 in (Home Health) 1 x Per Day/30 Days Discharge Instructions: Apply over primary dressing as directed. Secured With: Elastic Bandage 4 inch (ACE bandage) (Home Health) 1 x Per Day/30 Days Discharge Instructions: Secure with ACE bandage as directed. Secured With: [employer], 4.5x3.1 (in/yd) (Home Health) 1 x Per Day/30 Days Discharge Instructions: Secure with Kerlix as directed. Secured With: 31M Medipore H Soft Cloth Surgical T ape, 4 x 10 (in/yd) (Home Health) 1 x Per Day/30 Days Discharge Instructions: Secure with tape as directed. Electronic Signature(s) Signed: 02/17/2022 3:54:07 PM By: Geralyn Corwin DO Entered By: Geralyn Corwin on 02/17/2022  15:24:31 -------------------------------------------------------------------------------- Problem List Details Patient Name: Date of Service: [name], Christine Curls [name] 02/17/2022 1:30 PM Medical Record Number: [id number] Patient Account Number: [id number] Date of Birth/Sex: Treating RN: 09/03/1950 (71 y.o. [name] Primary Care Provider: [name] Other Clinician: Referring Provider: Treating Provider/Extender: Annamary Rummage in Treatment: 12 Active Problems ICD-10 Encounter Code Description Active Date MDM Diagnosis [phone] Non-pressure chronic ulcer of other part  of left lower leg with fat layer exposed3/31/2023 No Yes T79.8XXA Other early complications of trauma, initial encounter 11/19/2021 No Yes I48.0 Paroxysmal atrial fibrillation 11/19/2021 No Yes Z79.01 Long term (current) use of anticoagulants 11/19/2021 No Yes Inactive Problems Resolved Problems Electronic Signature(s) Signed: 02/17/2022 3:54:07 PM By: Kalman Shan DO Entered By: Kalman Shan on 02/17/2022 15:22:10 -------------------------------------------------------------------------------- Progress Note Details Patient Name: Date of Service: Christine Debar [name] 02/17/2022 1:30 PM Medical Record Number: [id number] Patient Account Number: [id number] Date of Birth/Sex: Treating RN: Jul 09, 1951 (70 y.o. [name] Primary Care Provider: Deland Pretty Other Clinician: Referring Provider: Treating Provider/Extender: Judie Grieve in Treatment: 12 Subjective Chief Complaint Information obtained from Patient 11/19/2021; Left lower extremity wound status post fall History of Present Illness (HPI) Admission 11/19/2021 Ms. Oralia Marques is a 71 year old female with a past medical history of paroxysmal A-fib on Eliquis, hypothyroidism, major depressive disorder, venous insufficiency and chronic diastolic heart failure that presents to the clinic for a 1  month history of nonhealing wound to the left lower extremity. She visited the ED on 10/18/2021 after a mechanical fall. She developed a hematoma that subsequently opened. She was hospitalized for 7 days and discharged on 10/25/2021. She has been on several different antibiotics For the past month. She states that most recently she was on Levaquin and linezolid for the past week. She states she completes her antibiotic course tomorrow. She has been using Dakin's wet-to-dry dressings to the wound bed. She denies signs of infection. 4/7; patient presents for follow-up. She has been using Dakin's wet-to-dry dressings. She did end up going to the ED on 4/1 because she had excess bleeding with dressing change that she could not stop. She is on Eliquis for A-fib. In the ED they tied off a small artery. She has had no issues since discharge. She denies signs of infection. 4/14; this is a very difficult clinical situation. A patient with underlying chronic venous insufficiency and lymphedema very significant lower extremity edema had a hematoma after a fall on her left upper lateral lower leg. She is on Eliquis for atrial fibrillation apparently with a history of a splenic infarct following with Dr. Jolyn Nap of cardiology. She has exhibited significant bleeding from the wound surface including ao Venous bleeder that required suturing short while ago. She has been using Dakin's wet-to-dry packing and over the surface of the wound. She saw Dr. Caryl Comes yesterday he is reluctant to consider stopping the Eliquis because of the prior history of presumed cardioembolism. Wants to communicate with Dr. Heber [place] when she returns. In a perfect world where she was not on Eliquis she requires a wound VAC with additional compression wraps but I understand the reluctance to do this because of the concerns of bleeding 4/28; the patient's wound actually looks better today using Dakin's wet-to-dry that she is changing twice a day  she is wrapping this with Kerlix and Ace wrapping. She tells me she had 2 small bleeding areas which were part of the superficial wound that stopped this week with direct pressure. Other than that no major issues. In follow-up from discussion of last week Dr. 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 The bigger question from my point of view is the wound VAC issue. As far as she knows and her daughter-in-law verifies that  she has not had any bleeding from the deeper parts of the wound although the bleeding has been superficial including the one that sent her to the ER for stitches. She is concerned that a wound VAC would cause further bleeding and I cannot completely allay those concerns. 5/8; patient presents for follow-up. She has no issues or complaints today. She has been using Dakin's wet-to-dry dressings without issues. She denies signs of infection. 5/12; patient presents for follow-up. She continues to use Dakin's wet-to-dry dressings without issues. She denies signs of infection. She reports some issues with bleeding at times but this has improved. She denies signs of infection. 6/1; patient presents for follow-up. She was recently hospitalized for upper left leg thigh cellulitis. She was given IV cefepime and vancomycin and discharged on oral antibiotics. She has been using Dakin's wet-to-dry dressings to the left lower leg wound. She reports improvement in healing. She currently denies systemic signs of infection. 6/7; patient is using Dakin's wet-to-dry twice daily. In general this looks better than when I saw this a month or so ago however still considerable depth to the tunnel in her left leg. She is going for iron infusions ordered by her primary care doctor I believe 6/15; patient presents for follow-up. She has been  using Dakin's wet-to-dry dressings. She has noted some scattered small areas that blister up and heal on her left lower leg. She has been using mupirocin ointment on them. Nothing open today. 6/23; patient's been using Dakin's wet-to-dry dressings to the tunneled wound and Hydrofera Blue to the opening. She has no issues or complaints today. 6/29; patient presents for follow-up. She has been using Dakin's wet-to-dry dressings to the tunneled wound and Hydrofera Blue to the opening. She states that [facility] is sticking to the wound bed. She denies signs of infection. Patient History Medical History Cardiovascular Patient has history of Congestive Heart Failure, Hypertension Hospitalization/Surgery History - Cellulitis left leg- 01/03/2022-01/07/2022. Medical A Surgical History Notes nd Constitutional Symptoms (General Health) Infarction of spleen Hematologic/Lymphatic Hypothyroidism Cardiovascular A-Fib Gastrointestinal Gastroesophageal reflux Genitourinary Chronic kidney disease Musculoskeletal Osteoarthritis of knee Psychiatric Anxiety Objective Constitutional respirations regular, non-labored and within target range for patient.. Vitals Time Taken: 1:45 PM, Height: 67 in, Weight: 340 lbs, BMI: 53.2, Temperature: 98.7 F, Pulse: 49 bpm, Respiratory Rate: 20 breaths/min, Blood Pressure: 145/75 mmHg. General Notes: per patient asymptomatic with low heart rate of 49. Rechecked manually and 49 is accurate. Cardiovascular 2+ dorsalis pedis/posterior tibialis pulses. Psychiatric pleasant and cooperative. General Notes: Left lower extremity: Open wound with hypergranulated tissue to the surrounding opening. Increased depth and tunneling within. No surrounding signs of infection. Integumentary (Hair, Skin) Wound #1 status is Open. Original cause of wound was Trauma. The date acquired was: 10/18/2021. The wound has been in treatment 12 weeks. The wound is located on the  Left,Lateral Lower Leg. The wound measures 2.8cm length x 5.3cm width x 2.8cm depth; 11.655cm^2 area and 32.635cm^3 volume. There is muscle and Fat Layer (Subcutaneous Tissue) exposed. There is no undermining noted, however, there is tunneling at 12:00 with a maximum distance of 7.8cm. There is a medium amount of sanguinous drainage noted. The wound margin is distinct with the outline attached to the wound base. There is large (67-100%) red, friable granulation within the wound bed. There is no necrotic tissue within the wound bed. Assessment Active Problems ICD-10 Non-pressure chronic ulcer of other part of left lower leg with fat layer exposed Other early complications of trauma, initial encounter Paroxysmal atrial  fibrillation Long term (current) use of anticoagulants Patient's wound has shown improvement in size in appearance since last clinic visit. I recommended continuing Dakin's wet-to-dry dressings to the tunneled area. Because the Hydrofera Blue is sticking I recommended switching to PolyMem silver. I used silver nitrate to the hyper granulated tissue. Follow-up in 1 week Procedures Wound #1 Pre-procedure diagnosis of Wound #1 is a Trauma, Other located on the Left,Lateral Lower Leg . An Chemical Cauterization procedure was performed by Geralyn Corwin, DO. Post procedure Diagnosis Wound #1: Same as Pre-Procedure Notes: silver nitrate used. Plan Follow-up Appointments: Return Appointment in 1 week. - Dr. Mikey Bussing and [place], Room 8 Friday 02/25/2022 130pm Return Appointment in 2 weeks. - Dr. Mikey Bussing and [place], Room 8 03/03/2022 215pm Other: - Patient to call [facility] to be fitted for compression stockings. Address [address] [place]; Phone #[phone]. Call them to make an appt time. Bathing/ Shower/ Hygiene: May shower with protection but do not get wound dressing(s) wet. Edema Control - Lymphedema / SCD / Other: Elevate legs to the level of the heart or above for  30 minutes daily and/or when sitting, a frequency of: - 3-4 times a day throughout the day. Avoid standing for long periods of time. Compression stocking or Garment 20-30 mm/Hg pressure to: - patient to purchase and wear apply in the morning and remove at night. no ace wrap when wearing. Home Health: No change in wound care orders this week; continue Home Health for wound care. May utilize formulary equivalent dressing for wound treatment orders unless otherwise specified. - 2x a week dressing changes and all other days family member to change. Other Home Health Orders/Instructions: - [facility] WOUND #1: - Lower Leg Wound Laterality: Left, Lateral Cleanser: Wound Cleanser (Home Health) 1 x Per Day/30 Days Discharge Instructions: Cleanse the wound with wound cleanser prior to applying a clean dressing using gauze sponges, not tissue or cotton balls. Peri-Wound Care: Sween Lotion (Moisturizing lotion) 1 x Per Day/30 Days Discharge Instructions: Apply moisturizing lotion to left leg with dressing. Prim Dressing: PolyMem Silver Non-Adhesive Dressing, 4.25x4.25 in (Home Health) 1 x Per Day/30 Days ary Discharge Instructions: Apply to wound bed as instructed Prim Dressing: Plain packing strip 1/4 (in) 1 x Per Day/30 Days ary Discharge Instructions: Lightly pack as instructed Prim Dressing: Dakin's Solution 0.25%, 16 (oz) (Home Health) 1 x Per Day/30 Days ary Discharge Instructions: Moisten gauze with Dakin's solution Secondary Dressing: ABD Pad, 8x10 (Home Health) 1 x Per Day/30 Days Discharge Instructions: Apply over primary dressing as directed. Secondary Dressing: Woven Gauze Sponge, Non-Sterile 4x4 in (Home Health) 1 x Per Day/30 Days Discharge Instructions: Apply over primary dressing as directed. Secured With: Elastic Bandage 4 inch (ACE bandage) (Home Health) 1 x Per Day/30 Days Discharge Instructions: Secure with ACE bandage as directed. Secured With: [employer], 4.5x3.1  (in/yd) (Home Health) 1 x Per Day/30 Days Discharge Instructions: Secure with Kerlix as directed. Secured With: 72M Medipore H Soft Cloth Surgical T ape, 4 x 10 (in/yd) (Home Health) 1 x Per Day/30 Days Discharge Instructions: Secure with tape as directed. 1. Silver nitrate 2. PolyMem silver 3. Dakin's wet-to-dry 4. Follow-up in 1 week Electronic Signature(s) Signed: 02/17/2022 3:54:07 PM By: Geralyn Corwin DO Entered By: Geralyn Corwin on 02/17/2022 15:24:59 -------------------------------------------------------------------------------- HxROS Details Patient Name: Date of Service: [name], Christine [name] 02/17/2022 1:30 PM Medical Record Number: [id number] Patient Account Number: [id number] Date of Birth/Sex: Treating RN: 09/25/1950 (71 y.o. [name], [name] Primary  Care Provider: Deland Pretty Other Clinician: Referring Provider: Treating Provider/Extender: Judie Grieve in Treatment: 12 Constitutional Symptoms (General Health) Medical History: Past Medical History Notes: Infarction of spleen Hematologic/Lymphatic Medical History: Past Medical History Notes: Hypothyroidism Cardiovascular Medical History: Positive for: Congestive Heart Failure; Hypertension Past Medical History Notes: A-Fib Gastrointestinal Medical History: Past Medical History Notes: Gastroesophageal reflux Genitourinary Medical History: Past Medical History Notes: Chronic kidney disease Musculoskeletal Medical History: Past Medical History Notes: Osteoarthritis of knee Psychiatric Medical History: Past Medical History Notes: Anxiety Immunizations Pneumococcal Vaccine: Received Pneumococcal Vaccination: No Implantable Devices No devices added Hospitalization / Surgery History Type of Hospitalization/Surgery Cellulitis left leg- 01/03/2022-01/07/2022 Electronic Signature(s) Signed: 02/17/2022 3:54:07 PM By: Kalman Shan DO Signed: 02/17/2022 5:00:34 PM By: Deon Pilling RN, BSN Entered By: Kalman Shan on 02/17/2022 15:22:56 -------------------------------------------------------------------------------- SuperBill Details Patient Name: Date of Service: [name]. 02/17/2022 Medical Record Number: [id number] Patient Account Number: [id number] Date of Birth/Sex: Treating RN: June 07, 1951 (71 y.o. [name] Primary Care Provider: Deland Pretty Other Clinician: Referring Provider: Treating Provider/Extender: Judie Grieve in Treatment: 12 Diagnosis Coding ICD-10 Codes Code Description [phone] Non-pressure chronic ulcer of other part of left lower leg with fat layer exposed T79.8XXA Other early complications of trauma, initial encounter I48.0 Paroxysmal atrial fibrillation Z79.01 Long term (current) use of anticoagulants Facility Procedures Physician Procedures : CPT4 Code Description Modifier [id number] - WC PHYS CHEM CAUT GRAN TISSUE ICD-10 Diagnosis Description L97.822 Non-pressure chronic ulcer of other part of left lower leg with fat layer exposed Quantity: 1 Electronic Signature(s) Signed: 02/17/2022 3:54:07 PM By: Kalman Shan DO Entered By: Kalman Shan on 02/17/2022 15:25:11

## 2022-02-18 DIAGNOSIS — E039 Hypothyroidism, unspecified: Secondary | ICD-10-CM | POA: Diagnosis not present

## 2022-02-18 DIAGNOSIS — I480 Paroxysmal atrial fibrillation: Secondary | ICD-10-CM | POA: Diagnosis not present

## 2022-02-18 DIAGNOSIS — E669 Obesity, unspecified: Secondary | ICD-10-CM | POA: Diagnosis not present

## 2022-02-18 DIAGNOSIS — I10 Essential (primary) hypertension: Secondary | ICD-10-CM | POA: Diagnosis not present

## 2022-02-25 ENCOUNTER — Encounter (HOSPITAL_BASED_OUTPATIENT_CLINIC_OR_DEPARTMENT_OTHER): Payer: PPO | Attending: Internal Medicine | Admitting: Internal Medicine

## 2022-02-25 DIAGNOSIS — I480 Paroxysmal atrial fibrillation: Secondary | ICD-10-CM | POA: Insufficient documentation

## 2022-02-25 DIAGNOSIS — I130 Hypertensive heart and chronic kidney disease with heart failure and stage 1 through stage 4 chronic kidney disease, or unspecified chronic kidney disease: Secondary | ICD-10-CM | POA: Diagnosis not present

## 2022-02-25 DIAGNOSIS — T798XXA Other early complications of trauma, initial encounter: Secondary | ICD-10-CM | POA: Diagnosis not present

## 2022-02-25 DIAGNOSIS — I5032 Chronic diastolic (congestive) heart failure: Secondary | ICD-10-CM | POA: Insufficient documentation

## 2022-02-25 DIAGNOSIS — I872 Venous insufficiency (chronic) (peripheral): Secondary | ICD-10-CM | POA: Diagnosis not present

## 2022-02-25 DIAGNOSIS — Z7901 Long term (current) use of anticoagulants: Secondary | ICD-10-CM | POA: Diagnosis not present

## 2022-02-25 DIAGNOSIS — L97822 Non-pressure chronic ulcer of other part of left lower leg with fat layer exposed: Secondary | ICD-10-CM | POA: Diagnosis not present

## 2022-02-25 DIAGNOSIS — N189 Chronic kidney disease, unspecified: Secondary | ICD-10-CM | POA: Diagnosis not present

## 2022-02-25 NOTE — Progress Notes (Signed)
[name], [name] ([id number]) Visit Report for 02/25/2022 Chief Complaint Document Details Patient Name: Date of Service: [name] [name] 02/25/2022 1:30 PM Medical Record Number: [id number] Patient Account Number: [id number] Date of Birth/Sex: Treating RN: 1951/06/13 (71 y.o. [name] Primary Care Provider: [name] Other Clinician: Referring Provider: Treating Provider/Extender: Annamary Rummage in Treatment: 14 Information Obtained from: Patient Chief Complaint 11/19/2021; Left lower extremity wound status post fall Electronic Signature(s) Signed: 02/25/2022 2:00:30 PM By: Geralyn Corwin DO Entered By: Geralyn Corwin on 02/25/2022 13:28:25 -------------------------------------------------------------------------------- HPI Details Patient Name: Date of Service: [name] [name], [name] [name] 02/25/2022 1:30 PM Medical Record Number: [id number] Patient Account Number: [id number] Date of Birth/Sex: Treating RN: Oct 29, 1950 (71 y.o. [name] Primary Care Provider: [name] Other Clinician: Referring Provider: Treating Provider/Extender: Annamary Rummage in Treatment: 14 History of Present Illness HPI Description: Admission 11/19/2021 [name] [name] is a 71 year old female with a past medical history of paroxysmal A-fib on Eliquis, hypothyroidism, major depressive disorder, venous insufficiency and chronic diastolic heart failure that presents to the clinic for a 1 month history of nonhealing wound to the left lower extremity. She visited the ED on 10/18/2021 after a mechanical fall. She developed a hematoma that subsequently opened. She was hospitalized for 7 days and discharged on 10/25/2021. She has been on several different antibiotics For the past month. She states that most recently she was on Levaquin and linezolid for the past week. She states she completes her antibiotic course tomorrow. She has been using Dakin's  wet-to-dry dressings to the wound bed. She denies signs of infection. 4/7; patient presents for follow-up. She has been using Dakin's wet-to-dry dressings. She did end up going to the ED on 4/1 because she had excess bleeding with dressing change that she could not stop. She is on Eliquis for A-fib. In the ED they tied off a small artery. She has had [name] issues since discharge. She denies signs of infection. 4/14; this is a very difficult clinical situation. A patient with underlying chronic venous insufficiency and lymphedema very significant lower extremity edema had a hematoma after a fall on her left upper lateral lower leg. She is on Eliquis for atrial fibrillation apparently with a history of a splenic infarct following with Dr. Berton Mount of cardiology. She has exhibited significant bleeding from the wound surface including ao Venous bleeder that required suturing short while ago. She has been using Dakin's wet-to-dry packing and over the surface of the wound. She saw Dr. Graciela Husbands yesterday he is reluctant to consider stopping the Eliquis because of the prior history of presumed cardioembolism. Wants to communicate with Dr. Mikey Bussing when she returns. In a perfect world where she was not on Eliquis she requires a wound VAC with additional compression wraps but I understand the reluctance to do this because of the concerns of bleeding 4/28; the patient's wound actually looks better today using Dakin's wet-to-dry that she is changing twice a day she is wrapping this with Kerlix and Ace wrapping. She tells me she had 2 small bleeding areas which were part of the superficial wound that stopped this week with direct pressure. Other than that [name] major issues. In follow-up from discussion of last week Dr. 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 The bigger question  from my point of view is the wound VAC issue. As far as she knows and her daughter-in-law verifies that she has not had any bleeding from the deeper parts of the wound although the bleeding has been superficial including the one that sent her to the ER for stitches. She is concerned that a wound VAC would cause further bleeding and I cannot completely allay those concerns. 5/8; patient presents for follow-up. She has [name] issues or complaints today. She has been using Dakin's wet-to-dry dressings without issues. She denies signs of infection. 5/12; patient presents for follow-up. She continues to use Dakin's wet-to-dry dressings without issues. She denies signs of infection. She reports some issues with bleeding at times but this has improved. She denies signs of infection. 6/1; patient presents for follow-up. She was recently hospitalized for upper left leg thigh cellulitis. She was given IV cefepime and vancomycin and discharged on oral antibiotics. She has been using Dakin's wet-to-dry dressings to the left lower leg wound. She reports improvement in healing. She currently denies systemic signs of infection. 6/7; patient is using Dakin's wet-to-dry twice daily. In general this looks better than when I saw this a month or so ago however still considerable depth to the tunnel in her left leg. She is going for iron infusions ordered by her primary care doctor I believe 6/15; patient presents for follow-up. She has been using Dakin's wet-to-dry dressings. She has noted some scattered small areas that blister up and heal on her left lower leg. She has been using mupirocin ointment on them. Nothing open today. 6/23; patient's been using Dakin's wet-to-dry dressings to the tunneled wound and Hydrofera Blue to the opening. She has [name] issues or complaints today. 6/29; patient presents for follow-up. She has been using Dakin's wet-to-dry dressings to the tunneled wound and  Hydrofera Blue to the opening. She states that [facility] is sticking to the wound bed. She denies signs of infection. 7/7; patient presents for follow-up. She has been using Dakin's wet-to-dry dressings to the tunneled wound and PolyMem silver to the opening without issues. Electronic Signature(s) Signed: 02/25/2022 2:00:30 PM By: Geralyn Corwin DO Entered By: Geralyn Corwin on 02/25/2022 13:29:16 -------------------------------------------------------------------------------- Physical Exam Details Patient Name: Date of Service: [name] [name] [name] 02/25/2022 1:30 PM Medical Record Number: [id number] Patient Account Number: [id number] Date of Birth/Sex: Treating RN: 03-06-1951 (71 y.o. [name] Primary Care Provider: [name] Other Clinician: Referring Provider: Treating Provider/Extender: Annamary Rummage in Treatment: 14 Constitutional respirations regular, non-labored and within target range for patient.. Cardiovascular 2+ dorsalis pedis/posterior tibialis pulses. Psychiatric pleasant and cooperative. Notes Left lower extremity: Open wound with granulation tissue to the opening and increased depth and tunneling within. [name] signs of surrounding infection. Electronic Signature(s) Signed: 02/25/2022 2:00:30 PM By: Geralyn Corwin DO Entered By: Geralyn Corwin on 02/25/2022 13:30:06 -------------------------------------------------------------------------------- Physician Orders Details Patient Name: Date of Service: [name] [name], [name] Curls [name] 02/25/2022 1:30 PM Medical Record Number: [id number] Patient Account Number: [id number] Date of Birth/Sex: Treating RN: 1951-06-28 (71 y.o. [name] Primary Care Provider: [name] Other Clinician: Referring Provider: Treating Provider/Extender: Annamary Rummage in Treatment: 14 Verbal / Phone Orders: [name] Diagnosis Coding ICD-10 Coding Code Description [phone] Non-pressure  chronic ulcer of other part of left lower leg with fat layer exposed T79.8XXA Other early complications of trauma, initial encounter I48.0 Paroxysmal atrial fibrillation Z79.01 Long term (current) use of anticoagulants Follow-up Appointments ppointment in 1 week. - Dr. Mikey Bussing and  Yvonne KendallBobbi, Room 8 03/03/2022 215pm Thursday Return A ppointment in 2 weeks. - Dr. Mikey BussingHoffman and [place], Room 8 03/10/2022 215pm Thursday Return A Other: - Patient keep the appt with [facility] to be fitted for compression stockings. Address [address] [place]; Phone #[phone]. Bathing/ Shower/ Hygiene May shower with protection but do not get wound dressing(s) wet. Edema Control - Lymphedema / SCD / Other Elevate legs to the level of the heart or above for 30 minutes daily and/or when sitting, a frequency of: - 3-4 times a day throughout the day. Avoid standing for long periods of time. Compression stocking or Garment 20-30 mm/Hg pressure to: - patient to purchase and wear apply in the morning and remove at night. [name] ace wrap when wearing. Home Health New wound care orders this week; continue Home Health for wound care. May utilize formulary equivalent dressing for wound treatment orders unless otherwise specified. - change every other day dressings. Other Home Health Orders/Instructions: - [facility] Wound Treatment Wound #1 - Lower Leg Wound Laterality: Left, Lateral Cleanser: Wound Cleanser (Home Health) Every Other Day/30 Days Discharge Instructions: Cleanse the wound with wound cleanser prior to applying a clean dressing using gauze sponges, not tissue or cotton balls. Peri-Wound Care: Sween Lotion (Moisturizing lotion) Every Other Day/30 Days Discharge Instructions: Apply moisturizing lotion to left leg with dressing. Prim Dressing: PolyMem Silver Non-Adhesive Dressing, 4.25x4.25 in [facility](Home Health) Every Other Day/30 Days ary Discharge Instructions: Apply to wound bed. Prim Dressing: Poly Mem  Silver Rope Every Other Day/30 Days ary Discharge Instructions: Lightly pack into tunnel at 12. Secured With: Elastic Bandage 4 inch (ACE bandage) (Home Health) Every Other Day/30 Days Discharge Instructions: Secure with ACE bandage as directed. Secured With: [employer] Roll Sterile, 4.5x3.1 (in/yd) [facility](Home Health) Every Other Day/30 Days Discharge Instructions: Secure with Kerlix as directed. Secured With: 26M Medipore H Soft Cloth Surgical T ape, 4 x 10 (in/yd) (Home Health) Every Other Day/30 Days Discharge Instructions: Secure with tape as directed. Electronic Signature(s) Signed: 02/25/2022 2:00:30 PM By: Geralyn CorwinHoffman, Gearold Wainer DO Entered By: Geralyn CorwinHoffman, Latrish Mogel on 02/25/2022 13:30:15 -------------------------------------------------------------------------------- Problem List Details Patient Name: Date of Service: [name] Pali[place] [name], [name] CurlsGWENDO [name] 02/25/2022 1:30 PM Medical Record Number: [id number] Patient Account Number: [id number] Date of Birth/Sex: Treating RN: 10/02/1950 (71 y.o. 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/31/2023 [name] Yes T79.8XXA Other early complications of trauma, initial encounter 11/19/2021 [name] Yes I48.0 Paroxysmal atrial fibrillation 11/19/2021 [name] Yes Z79.01 Long term (current) use of anticoagulants 11/19/2021 [name] Yes Inactive Problems Resolved Problems Electronic Signature(s) Signed: 02/25/2022 2:00:30 PM By: Geralyn CorwinHoffman, Jacquilyn Seldon DO Entered By: Geralyn CorwinHoffman, Delesa Kawa on 02/25/2022 13:27:32 -------------------------------------------------------------------------------- Progress Note Details Patient Name: Date of Service: [name] RollingNO [name], [name] [name] 02/25/2022 1:30 PM Medical Record Number:  [id number] Patient Account Number: [id number] Date of Birth/Sex: Treating RN: 11/05/1950 (71 y.o. Arta SilenceF) Deaton, Bobbi Primary Care Provider: Merri BrunettePharr, Walter Other Clinician: Referring Provider: Treating Provider/Extender: Annamary RummageHoffman, Jovan Colligan Pharr, Walter Weeks in Treatment: 14 Subjective Chief Complaint Information obtained from Patient 11/19/2021; Left lower extremity wound status post fall History of Present Illness (HPI) Admission 11/19/2021 [name] PerkingGwendolyn [name] is a 71 year old female with a past medical history of paroxysmal A-fib on Eliquis, hypothyroidism, major depressive disorder, venous insufficiency and chronic diastolic heart failure that presents to the  clinic for a 1 month history of nonhealing wound to the left lower extremity. She visited the ED on 10/18/2021 after a mechanical fall. She developed a hematoma that subsequently opened. She was hospitalized for 7 days and discharged on 10/25/2021. She has been on several different antibiotics For the past month. She states that most recently she was on Levaquin and linezolid for the past week. She states she completes her antibiotic course tomorrow. She has been using Dakin's wet-to-dry dressings to the wound bed. She denies signs of infection. 4/7; patient presents for follow-up. She has been using Dakin's wet-to-dry dressings. She did end up going to the ED on 4/1 because she had excess bleeding with dressing change that she could not stop. She is on Eliquis for A-fib. In the ED they tied off a small artery. She has had [name] issues since discharge. She denies signs of infection. 4/14; this is a very difficult clinical situation. A patient with underlying chronic venous insufficiency and lymphedema very significant lower extremity edema had a hematoma after a fall on her left upper lateral lower leg. She is on Eliquis for atrial fibrillation apparently with a history of a splenic infarct following with Dr. Berton Mount of cardiology. She has  exhibited significant bleeding from the wound surface including ao Venous bleeder that required suturing short while ago. She has been using Dakin's wet-to-dry packing and over the surface of the wound. She saw Dr. Graciela Husbands yesterday he is reluctant to consider stopping the Eliquis because of the prior history of presumed cardioembolism. Wants to communicate with Dr. Mikey Bussing when she returns. In a perfect world where she was not on Eliquis she requires a wound VAC with additional compression wraps but I understand the reluctance to do this because of the concerns of bleeding 4/28; the patient's wound actually looks better today using Dakin's wet-to-dry that she is changing twice a day she is wrapping this with Kerlix and Ace wrapping. She tells me she had 2 small bleeding areas which were part of the superficial wound that stopped this week with direct pressure. Other than that [name] major issues. In follow-up from discussion of last week Dr. 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 The bigger question from my point of view is the wound VAC issue. As far as she knows and her daughter-in-law verifies that she has not had any bleeding from the deeper parts of the wound although the bleeding has been superficial including the one that sent her to the ER for stitches. She is concerned that a wound VAC would cause further bleeding and I cannot completely allay those concerns. 5/8; patient presents for follow-up. She has [name] issues or complaints today. She has been using Dakin's wet-to-dry dressings without issues. She denies signs of infection. 5/12; patient presents for follow-up. She continues to use Dakin's wet-to-dry dressings without issues. She denies signs of infection. She reports some issues with bleeding at times but this has improved.  She denies signs of infection. 6/1; patient presents for follow-up. She was recently hospitalized for upper left leg thigh cellulitis. She was given IV cefepime and vancomycin and discharged on oral antibiotics. She has been using Dakin's wet-to-dry dressings to the left lower leg wound. She reports improvement in healing. She currently denies systemic signs of infection. 6/7; patient is using Dakin's wet-to-dry  twice daily. In general this looks better than when I saw this a month or so ago however still considerable depth to the tunnel in her left leg. She is going for iron infusions ordered by her primary care doctor I believe 6/15; patient presents for follow-up. She has been using Dakin's wet-to-dry dressings. She has noted some scattered small areas that blister up and heal on her left lower leg. She has been using mupirocin ointment on them. Nothing open today. 6/23; patient's been using Dakin's wet-to-dry dressings to the tunneled wound and Hydrofera Blue to the opening. She has [name] issues or complaints today. 6/29; patient presents for follow-up. She has been using Dakin's wet-to-dry dressings to the tunneled wound and Hydrofera Blue to the opening. She states that [facility] is sticking to the wound bed. She denies signs of infection. 7/7; patient presents for follow-up. She has been using Dakin's wet-to-dry dressings to the tunneled wound and PolyMem silver to the opening without issues. Patient History Medical History Cardiovascular Patient has history of Congestive Heart Failure, Hypertension Hospitalization/Surgery History - Cellulitis left leg- 01/03/2022-01/07/2022. Medical A Surgical History Notes nd Constitutional Symptoms (General Health) Infarction of spleen Hematologic/Lymphatic Hypothyroidism Cardiovascular A-Fib Gastrointestinal Gastroesophageal reflux Genitourinary Chronic kidney disease Musculoskeletal Osteoarthritis of  knee Psychiatric Anxiety Objective Constitutional respirations regular, non-labored and within target range for patient.. Vitals Time Taken: 1:11 PM, Height: 67 in, Weight: 340 lbs, BMI: 53.2, Temperature: 97.9 F, Pulse: 48 bpm, Respiratory Rate: 20 breaths/min, Blood Pressure: 155/60 mmHg. Cardiovascular 2+ dorsalis pedis/posterior tibialis pulses. Psychiatric pleasant and cooperative. General Notes: Left lower extremity: Open wound with granulation tissue to the opening and increased depth and tunneling within. [name] signs of surrounding infection. Integumentary (Hair, Skin) Wound #1 status is Open. Original cause of wound was Trauma. The date acquired was: 10/18/2021. The wound has been in treatment 14 weeks. The wound is located on the Left,Lateral Lower Leg. The wound measures 2.5cm length x 4cm width x 2.1cm depth; 7.854cm^2 area and 16.493cm^3 volume. There is muscle and Fat Layer (Subcutaneous Tissue) exposed. There is [name] undermining noted, however, there is tunneling at 12:00 with a maximum distance of 6.4cm. There is a medium amount of sanguinous drainage noted. The wound margin is distinct with the outline attached to the wound base. There is large (67-100%) red granulation within the wound bed. There is [name] necrotic tissue within the wound bed. Assessment Active Problems ICD-10 Non-pressure chronic ulcer of other part of left lower leg with fat layer exposed Other early complications of trauma, initial encounter Paroxysmal atrial fibrillation Long term (current) use of anticoagulants Patient's wound has shown improvement in size and apperance since last clinic visit. I recommended continuing PolyMem silver to the opening and actually doing that the PolyMem silver rope to the tunneling. She can use Dakin's to clean the wound bed between dressing changes. Follow-up in 1 week. Plan Follow-up Appointments: Return Appointment in 1 week. - Dr. Mikey Bussing and [place], Room 8 03/03/2022  215pm Thursday Return Appointment in 2 weeks. - Dr. Mikey Bussing and [place], Room 8 03/10/2022 215pm Thursday Other: - Patient keep the appt with [facility] to be fitted for compression stockings. Address [address] [place]; Phone #[phone]. Bathing/ Shower/ Hygiene: May shower with protection but do not get wound dressing(s) wet. Edema Control - Lymphedema / SCD / Other: Elevate legs to the level of the heart or above for 30 minutes daily and/or when sitting, a frequency of: - 3-4 times a day throughout the day. Avoid  standing for long periods of time. Compression stocking or Garment 20-30 mm/Hg pressure to: - patient to purchase and wear apply in the morning and remove at night. [name] ace wrap when wearing. Home Health: New wound care orders this week; continue Home Health for wound care. May utilize formulary equivalent dressing for wound treatment orders unless otherwise specified. - change every other day dressings. Other Home Health Orders/Instructions: - [facility] WOUND #1: - Lower Leg Wound Laterality: Left, Lateral Cleanser: Wound Cleanser (Home Health) Every Other Day/30 Days Discharge Instructions: Cleanse the wound with wound cleanser prior to applying a clean dressing using gauze sponges, not tissue or cotton balls. Peri-Wound Care: Sween Lotion (Moisturizing lotion) Every Other Day/30 Days Discharge Instructions: Apply moisturizing lotion to left leg with dressing. Prim Dressing: PolyMem Silver Non-Adhesive Dressing, 4.25x4.25 in [facility]) Every Other Day/30 Days ary Discharge Instructions: Apply to wound bed. Prim Dressing: Poly Mem Silver Rope Every Other Day/30 Days ary Discharge Instructions: Lightly pack into tunnel at 12. Secured With: Elastic Bandage 4 inch (ACE bandage) (Home Health) Every Other Day/30 Days Discharge Instructions: Secure with ACE bandage as directed. Secured With: [employer], 4.5x3.1 (in/yd) [facility]) Every Other Day/30  Days Discharge Instructions: Secure with Kerlix as directed. Secured With: 42M Medipore H Soft Cloth Surgical T ape, 4 x 10 (in/yd) (Home Health) Every Other Day/30 Days Discharge Instructions: Secure with tape as directed. 1. PolyMem silver 2. Follow-up in 2 weeks Electronic Signature(s) Signed: 02/25/2022 2:00:30 PM By: Geralyn Corwin DO Entered By: Geralyn Corwin on 02/25/2022 13:32:17 -------------------------------------------------------------------------------- HxROS Details Patient Name: Date of Service: [name] [name], [name] [name] 02/25/2022 1:30 PM Medical Record Number: [id number] Patient Account Number: [id number] Date of Birth/Sex: Treating RN: 02-19-1951 (71 y.o. [name] Primary Care Provider: [name] Other Clinician: Referring Provider: Treating Provider/Extender: Annamary Rummage in Treatment: 14 Constitutional Symptoms (General Health) Medical History: Past Medical History Notes: Infarction of spleen Hematologic/Lymphatic Medical History: Past Medical History Notes: Hypothyroidism Cardiovascular Medical History: Positive for: Congestive Heart Failure; Hypertension Past Medical History Notes: A-Fib Gastrointestinal Medical History: Past Medical History Notes: Gastroesophageal reflux Genitourinary Medical History: Past Medical History Notes: Chronic kidney disease Musculoskeletal Medical History: Past Medical History Notes: Osteoarthritis of knee Psychiatric Medical History: Past Medical History Notes: Anxiety Immunizations Pneumococcal Vaccine: Received Pneumococcal Vaccination: [name] Implantable Devices [name] devices added Hospitalization / Surgery History Type of Hospitalization/Surgery Cellulitis left leg- 01/03/2022-01/07/2022 Electronic Signature(s) Signed: 02/25/2022 2:00:30 PM By: Geralyn Corwin DO Signed: 02/25/2022 5:28:42 PM By: Shawn Stall RN, BSN Entered By: Geralyn Corwin on 02/25/2022  13:29:23 -------------------------------------------------------------------------------- SuperBill Details Patient Name: Date of Service: [name] [name]. 02/25/2022 Medical Record Number: [id number] Patient Account Number: [id number] Date of Birth/Sex: Treating RN: 11-07-1950 (71 y.o. [name] Primary Care Provider: [name] Other Clinician: Referring Provider: Treating Provider/Extender: Annamary Rummage in Treatment: 14 Diagnosis Coding ICD-10 Codes Code Description [phone] Non-pressure chronic ulcer of other part of left lower leg with fat layer exposed T79.8XXA Other early complications of trauma, initial encounter I48.0 Paroxysmal atrial fibrillation Z79.01 Long term (current) use of anticoagulants Facility Procedures CPT4 Code: [id number] Description: 99213 - WOUND CARE VISIT-LEV 3 EST PT Modifier: Quantity: 1 Physician Procedures : CPT4 Code Description Modifier [id number] 99213 - WC PHYS LEVEL 3 - EST PT ICD-10 Diagnosis Description L97.822 Non-pressure chronic ulcer of other part of left lower leg with fat layer exposed T79.8XXA Other early complications of trauma, initial  encounter I48.0 Paroxysmal atrial fibrillation  Z79.01 Long term (current) use of anticoagulants Quantity: 1 Electronic Signature(s) Signed: 02/25/2022 2:00:30 PM By: Geralyn Corwin DO Entered By: Geralyn Corwin on 02/25/2022 13:32:33

## 2022-02-25 NOTE — Progress Notes (Signed)
Christine, [name] ([id number]) Visit Report for 02/25/2022 Arrival Information Details Patient Name: Date of Service: [name] 02/25/2022 1:30 PM Medical Record Number: [id number] Patient Account Number: [id number] Date of Birth/Sex: Treating RN: Jul 20, 1951 (71 y.o. [name], [name] Primary Care [name]: [name] Other Clinician: Referring [name]: Treating [name]/Extender: Judie Grieve in Treatment: 14 Visit Information History Since Last Visit Added or deleted any medications: No Patient Arrived: Wheel Chair Any new allergies or adverse reactions: No Arrival Time: 13:08 Had a fall or experienced change in No Accompanied By: self activities of daily living that may affect Transfer Assistance: Manual risk of falls: Patient Identification Verified: Yes Signs or symptoms of abuse/neglect since last visito No Secondary Verification Process Completed: Yes Hospitalized since last visit: No Patient Requires Transmission-Based Precautions: No Implantable device outside of the clinic excluding No Patient Has Alerts: Yes cellular tissue based products placed in the center Patient Alerts: Patient on Blood Thinner since last visit: Has Dressing in Place as Prescribed: Yes Pain Present Now: Yes Electronic Signature(s) Signed: 02/25/2022 5:28:42 PM By: Deon Pilling RN, BSN Entered By: Deon Pilling on 02/25/2022 13:11:28 -------------------------------------------------------------------------------- Clinic Level of Care Assessment Details Patient Name: Date of Service: Christine Debar [name] 02/25/2022 1:30 PM Medical Record Number: [id number] Patient Account Number: [id number] Date of Birth/Sex: Treating RN: 02/01/1951 (71 y.o. [name], [name] Primary Care [name]: [name] Other Clinician: Referring [name]: Treating [name]/Extender: Judie Grieve in Treatment: 14 Clinic Level of Care Assessment Items TOOL 4  Quantity Score X- 1 0 Use when only an EandM is performed on FOLLOW-UP visit ASSESSMENTS - Nursing Assessment / Reassessment X- 1 10 Reassessment of Co-morbidities (includes updates in patient status) X- 1 5 Reassessment of Adherence to Treatment Plan ASSESSMENTS - Wound and Skin A ssessment / Reassessment X - Simple Wound Assessment / Reassessment - one wound 1 5 []  - 0 Complex Wound Assessment / Reassessment - multiple wounds X- 1 10 Dermatologic / Skin Assessment (not related to wound area) ASSESSMENTS - Focused Assessment []  - 0 Circumferential Edema Measurements - multi extremities []  - 0 Nutritional Assessment / Counseling / Intervention []  - 0 Lower Extremity Assessment (monofilament, tuning fork, pulses) []  - 0 Peripheral Arterial Disease Assessment (using hand held doppler) ASSESSMENTS - Ostomy and/or Continence Assessment and Care []  - 0 Incontinence Assessment and Management []  - 0 Ostomy Care Assessment and Management (repouching, etc.) PROCESS - Coordination of Care X - Simple Patient / Family Education for ongoing care 1 15 []  - 0 Complex (extensive) Patient / Family Education for ongoing care X- 1 10 Staff obtains Programmer, systems, Records, T Results / Process Orders est []  - 0 Staff telephones HHA, Nursing Homes / Clarify orders / etc []  - 0 Routine Transfer to another Facility (non-emergent condition) []  - 0 Routine Hospital Admission (non-emergent condition) []  - 0 New Admissions / Biomedical engineer / Ordering NPWT Apligraf, etc. , []  - 0 Emergency Hospital Admission (emergent condition) X- 1 10 Simple Discharge Coordination []  - 0 Complex (extensive) Discharge Coordination PROCESS - Special Needs []  - 0 Pediatric / Minor Patient Management []  - 0 Isolation Patient Management []  - 0 Hearing / Language / Visual special needs []  - 0 Assessment of Community assistance (transportation, D/C planning, etc.) []  - 0 Additional assistance / Altered  mentation []  - 0 Support Surface(s) Assessment (bed, cushion, seat, etc.) INTERVENTIONS - Wound Cleansing / Measurement X - Simple Wound Cleansing - one wound 1 5 []  -  0 Complex Wound Cleansing - multiple wounds X- 1 5 Wound Imaging (photographs - any number of wounds) []  - 0 Wound Tracing (instead of photographs) X- 1 5 Simple Wound Measurement - one wound []  - 0 Complex Wound Measurement - multiple wounds INTERVENTIONS - Wound Dressings []  - 0 Small Wound Dressing one or multiple wounds X- 1 15 Medium Wound Dressing one or multiple wounds []  - 0 Large Wound Dressing one or multiple wounds []  - 0 Application of Medications - topical []  - 0 Application of Medications - injection INTERVENTIONS - Miscellaneous []  - 0 External ear exam []  - 0 Specimen Collection (cultures, biopsies, blood, body fluids, etc.) []  - 0 Specimen(s) / Culture(s) sent or taken to Lab for analysis []  - 0 Patient Transfer (multiple staff / Civil Service fast streamer / Similar devices) []  - 0 Simple Staple / Suture removal (25 or less) []  - 0 Complex Staple / Suture removal (26 or more) []  - 0 Hypo / Hyperglycemic Management (close monitor of Blood Glucose) []  - 0 Ankle / Brachial Index (ABI) - do not check if billed separately X- 1 5 Vital Signs Has the patient been seen at the hospital within the last three years: Yes Total Score: 100 Level Of Care: New/Established - Level 3 Electronic Signature(s) Signed: 02/25/2022 5:28:42 PM By: Deon Pilling RN, BSN Entered By: Deon Pilling on 02/25/2022 13:29:59 -------------------------------------------------------------------------------- Encounter Discharge Information Details Patient Name: Date of Service: Christine Debar [name] 02/25/2022 1:30 PM Medical Record Number: [id number] Patient Account Number: [id number] Date of Birth/Sex: Treating RN: 07-02-1951 (71 y.o. [name] Primary Care [name]: [name] Other Clinician: Referring  [name]: Treating [name]/Extender: Judie Grieve in Treatment: 14 Encounter Discharge Information Items Discharge Condition: Stable Ambulatory Status: Wheelchair Discharge Destination: Home Transportation: Private Auto Accompanied By: self Schedule Follow-up Appointment: Yes Clinical Summary of Care: Electronic Signature(s) Signed: 02/25/2022 5:28:42 PM By: Deon Pilling RN, BSN Entered By: Deon Pilling on 02/25/2022 13:30:22 -------------------------------------------------------------------------------- Lower Extremity Assessment Details Patient Name: Date of Service: Christine Simmonds J. 02/25/2022 1:30 PM Medical Record Number: [id number] Patient Account Number: [id number] Date of Birth/Sex: Treating RN: October 09, 1950 (71 y.o. [name] Primary Care Erikka Follmer: [name] Other Clinician: Referring Zackeriah Kissler: Treating Dorian Duval/Extender: Judie Grieve in Treatment: 14 Edema Assessment Assessed: Shirlyn Goltz: Yes] Patrice Paradise: No] Edema: [Left: Ye] [Right: s] Calf Left: Right: Point of Measurement: 31 cm From Medial Instep 41 cm Ankle Left: Right: Point of Measurement: 9 cm From Medial Instep 26 cm Vascular Assessment Pulses: Dorsalis Pedis Palpable: [Left:Yes] Electronic Signature(s) Signed: 02/25/2022 5:28:42 PM By: Deon Pilling RN, BSN Entered By: Deon Pilling on 02/25/2022 13:17:12 -------------------------------------------------------------------------------- Multi Wound Chart Details Patient Name: Date of Service: Christine Debar [name] 02/25/2022 1:30 PM Medical Record Number: [id number] Patient Account Number: [id number] Date of Birth/Sex: Treating RN: 03-06-1951 (71 y.o. [name] Primary Care Zhanna Melin: [name] Other Clinician: Referring Faizaan [place]: Treating Zamiyah Resendes/Extender: Judie Grieve in Treatment: 14 Vital Signs Height(in): 67 Pulse(bpm): 48 Weight(lbs): 340 Blood  Pressure(mmHg): 155/60 Body Mass Index(BMI): 53.2 Temperature(F): 97.9 Respiratory Rate(breaths/min): 20 Photos: [N/A:N/A] Left, Lateral Lower Leg N/A N/A Wound Location: Trauma N/A N/A Wounding Event: Trauma, Other N/A N/A Primary Etiology: Congestive Heart Failure, N/A N/A Comorbid History: Hypertension 10/18/2021 N/A N/A Date Acquired: 14 N/A N/A Weeks of Treatment: Open N/A N/A Wound Status: No N/A N/A Wound Recurrence: Yes N/A N/A Clustered Wound: 1 N/A N/A Clustered Quantity: 2.5x4x2.1 N/A N/A Measurements L x  W x D (cm) 7.854 N/A N/A A (cm) : rea 16.493 N/A N/A Volume (cm) : 95.90% N/A N/A % Reduction in A rea: 99.00% N/A N/A % Reduction in Volume: 12 Position 1 (o'clock): 6.4 Maximum Distance 1 (cm): Yes N/A N/A Tunneling: Full Thickness With Exposed Support N/A N/A Classification: Structures Medium N/A N/A Exudate Amount: Sanguinous N/A N/A Exudate Type: red N/A N/A Exudate Color: Distinct, outline attached N/A N/A Wound Margin: Large (67-100%) N/A N/A Granulation Amount: Red N/A N/A Granulation Quality: None Present (0%) N/A N/A Necrotic Amount: Fat Layer (Subcutaneous Tissue): Yes N/A N/A Exposed Structures: Muscle: Yes Fascia: No Tendon: No Joint: No Bone: No Small (1-33%) N/A N/A Epithelialization: Treatment Notes Electronic Signature(s) Signed: 02/25/2022 2:00:30 PM By: Kalman Shan DO Signed: 02/25/2022 5:28:42 PM By: Deon Pilling RN, BSN Entered By: Kalman Shan on 02/25/2022 13:28:15 -------------------------------------------------------------------------------- Multi-Disciplinary Care Plan Details Patient Name: Date of Service: Christine Debar [name] 02/25/2022 1:30 PM Medical Record Number: [id number] Patient Account Number: [id number] Date of Birth/Sex: Treating RN: 05-18-1951 (71 y.o. [name], [name] Primary Care Mela Perham: [name] Other Clinician: Referring Donovyn Guidice: Treating Elantra Caprara/Extender:  Judie Grieve in Treatment: 14 Active Inactive Abuse / Safety / Falls / Self Care Management Nursing Diagnoses: History of Falls Potential for falls Goals: Patient/caregiver will verbalize/demonstrate measure taken to improve self care Date Initiated: 11/19/2021 Target Resolution Date: 03/18/2022 Goal Status: Active Patient/caregiver will verbalize/demonstrate measures taken to prevent injury and/or falls Date Initiated: 11/19/2021 Target Resolution Date: 03/18/2022 Goal Status: Active Interventions: Provide education on basic hygiene Provide education on fall prevention Provide education on HBO safety Provide education on vaccinations Notes: Pain, Acute or Chronic Nursing Diagnoses: Pain, acute or chronic: actual or potential Potential alteration in comfort, pain Goals: Patient will verbalize adequate pain control and receive pain control interventions during procedures as needed Date Initiated: 11/19/2021 Target Resolution Date: 03/18/2022 Goal Status: Active Patient/caregiver will verbalize comfort level met Date Initiated: 11/19/2021 Target Resolution Date: 03/18/2022 Goal Status: Active Interventions: Encourage patient to take pain medications as prescribed Provide education on pain management Reposition patient for comfort Treatment Activities: Administer pain control measures as ordered : 11/19/2021 Notes: Electronic Signature(s) Signed: 02/25/2022 5:28:42 PM By: Deon Pilling RN, BSN Entered By: Deon Pilling on 02/25/2022 13:25:09 -------------------------------------------------------------------------------- Pain Assessment Details Patient Name: Date of Service: Christine Debar [name] 02/25/2022 1:30 PM Medical Record Number: [id number] Patient Account Number: [id number] Date of Birth/Sex: Treating RN: July 08, 1951 (71 y.o. [name] Primary Care Kerie Badger: [name] Other Clinician: Referring Jayko Voorhees: Treating Nashla Althoff/Extender:  Judie Grieve in Treatment: 14 Active Problems Location of Pain Severity and Description of Pain Patient Has Paino No Site Locations Rate the pain. Current Pain Level: 0 Pain Management and Medication Current Pain Management: Medication: No Cold Application: No Rest: No Massage: No Activity: No T.E.N.S.: No Heat Application: No Leg drop or elevation: No Is the Current Pain Management Adequate: Adequate How does your wound impact your activities of daily livingo Sleep: No Bathing: No Appetite: No Relationship With Others: No Bladder Continence: No Emotions: No Bowel Continence: No Work: No Toileting: No Drive: No Dressing: No Hobbies: No Notes per patient pain at times 5/10 in left knee. currently 0/10. Electronic Signature(s) Signed: 02/25/2022 5:28:42 PM By: Deon Pilling RN, BSN Entered By: Deon Pilling on 02/25/2022 13:12:10 -------------------------------------------------------------------------------- Patient/Caregiver Education Details Patient Name: Date of Service: [name] 7/7/2023andnbsp1:30 PM Medical Record Number: [id number] Patient Account Number: [id number] Date of Birth/Gender: Treating  RN: 02/20/1951 (71 y.o. [name] Primary Care Physician: [name] Other Clinician: Referring Physician: Treating Physician/Extender: Judie Grieve in Treatment: 14 Education Assessment Education Provided To: Patient Education Topics Provided Pain: Handouts: A Guide to Pain Control Methods: Explain/Verbal Responses: Reinforcements needed Electronic Signature(s) Signed: 02/25/2022 5:28:42 PM By: Deon Pilling RN, BSN Entered By: Deon Pilling on 02/25/2022 13:25:23 -------------------------------------------------------------------------------- Wound Assessment Details Patient Name: Date of Service: Christine Debar [name] 02/25/2022 1:30 PM Medical Record Number: [id number] Patient  Account Number: [id number] Date of Birth/Sex: Treating RN: 1951-03-15 (71 y.o. [name] Primary Care Sherrill Mckamie: [name] Other Clinician: Referring Malayia Spizzirri: Treating Nico Syme/Extender: Judie Grieve in Treatment: 14 Wound Status Wound Number: 1 Primary Etiology: Trauma, Other Wound Location: Left, Lateral Lower Leg Wound Status: Open Wounding Event: Trauma Comorbid History: Congestive Heart Failure, Hypertension Date Acquired: 10/18/2021 Weeks Of Treatment: 14 Clustered Wound: Yes Photos Wound Measurements Length: (cm) 2.5 Width: (cm) 4 Depth: (cm) 2.1 Clustered Quantity: 1 Area: (cm) 7.854 Volume: (cm) 16.493 % Reduction in Area: 95.9% % Reduction in Volume: 99% Epithelialization: Small (1-33%) Tunneling: Yes Position (o'clock): 12 Maximum Distance: (cm) 6.4 Undermining: No Wound Description Classification: Full Thickness With Exposed Support Structures Wound Margin: Distinct, outline attached Exudate Amount: Medium Exudate Type: Sanguinous Exudate Color: red Foul Odor After Cleansing: No Slough/Fibrino No Wound Bed Granulation Amount: Large (67-100%) Exposed Structure Granulation Quality: Red Fascia Exposed: No Necrotic Amount: None Present (0%) Fat Layer (Subcutaneous Tissue) Exposed: Yes Tendon Exposed: No Muscle Exposed: Yes Necrosis of Muscle: No Joint Exposed: No Bone Exposed: No Treatment Notes Wound #1 (Lower Leg) Wound Laterality: Left, Lateral Cleanser Wound Cleanser Discharge Instruction: Cleanse the wound with wound cleanser prior to applying a clean dressing using gauze sponges, not tissue or cotton balls. Peri-Wound Care Sween Lotion (Moisturizing lotion) Discharge Instruction: Apply moisturizing lotion to left leg with dressing. Topical Primary Dressing PolyMem Silver Non-Adhesive Dressing, 4.25x4.25 in Discharge Instruction: Apply to wound bed. Poly Mem Silver Rope Discharge Instruction: Lightly pack  into tunnel at 12. Secondary Dressing Secured With Elastic Bandage 4 inch (ACE bandage) Discharge Instruction: Secure with ACE bandage as directed. Kerlix Roll Sterile, 4.5x3.1 (in/yd) Discharge Instruction: Secure with Kerlix as directed. 33M Medipore H Soft Cloth Surgical T ape, 4 x 10 (in/yd) Discharge Instruction: Secure with tape as directed. Compression Wrap Compression Stockings Add-Ons Electronic Signature(s) Signed: 02/25/2022 5:28:42 PM By: Deon Pilling RN, BSN Entered By: Deon Pilling on 02/25/2022 13:19:09 -------------------------------------------------------------------------------- Vitals Details Patient Name: Date of Service: [name], Christine [name] 02/25/2022 1:30 PM Medical Record Number: [id number] Patient Account Number: [id number] Date of Birth/Sex: Treating RN: [birth year] (71 y.o. [name], [name] Primary Care Mikayah Joy: [name] Other Clinician: Referring Zaray Gatchel: Treating Janisa Labus/Extender: Judie Grieve in Treatment: 14 Vital Signs Time Taken: 13:11 Temperature (F): 97.9 Height (in): 67 Pulse (bpm): 48 Weight (lbs): 340 Respiratory Rate (breaths/min): 20 Body Mass Index (BMI): 53.2 Blood Pressure (mmHg): 155/60 Reference Range: 80 - 120 mg / dl Electronic Signature(s) Signed: 02/25/2022 5:28:42 PM By: Deon Pilling RN, BSN Entered By: Deon Pilling on 02/25/2022 13:11:42

## 2022-03-03 ENCOUNTER — Encounter (HOSPITAL_BASED_OUTPATIENT_CLINIC_OR_DEPARTMENT_OTHER): Payer: PPO | Admitting: Internal Medicine

## 2022-03-03 DIAGNOSIS — I480 Paroxysmal atrial fibrillation: Secondary | ICD-10-CM | POA: Diagnosis not present

## 2022-03-03 DIAGNOSIS — L97822 Non-pressure chronic ulcer of other part of left lower leg with fat layer exposed: Secondary | ICD-10-CM | POA: Diagnosis not present

## 2022-03-03 DIAGNOSIS — T798XXA Other early complications of trauma, initial encounter: Secondary | ICD-10-CM | POA: Diagnosis not present

## 2022-03-03 DIAGNOSIS — Z7901 Long term (current) use of anticoagulants: Secondary | ICD-10-CM | POA: Diagnosis not present

## 2022-03-03 NOTE — Progress Notes (Signed)
[name] ([id number]) Visit Report for 03/03/2022 Chief Complaint Document Details Patient Name: Date of Service: [name] 03/03/2022 2:15 PM Medical Record Number: [id number] Patient Account Number: [id number] Date of Birth/Sex: Treating RN: 1950-09-01 (71 y.o. [name] Primary Care Provider: Deland Pretty Other Clinician: Referring Provider: Treating Provider/Extender: Judie Grieve in Treatment: 14 Information Obtained from: Patient Chief Complaint 11/19/2021; Left lower extremity wound status post fall Electronic Signature(s) Signed: 03/03/2022 3:21:49 PM By: Kalman Shan DO Entered By: Kalman Shan on 03/03/2022 15:01:21 -------------------------------------------------------------------------------- HPI Details Patient Name: Date of Service: [name], Christine [name] 03/03/2022 2:15 PM Medical Record Number: [id number] Patient Account Number: [id number] Date of Birth/Sex: Treating RN: [birth year] (71 y.o. [name] Primary Care Provider: Deland Pretty Other Clinician: Referring Provider: Treating Provider/Extender: Judie Grieve in Treatment: 14 History of Present Illness HPI Description: Admission 11/19/2021 [name] is a 71 year old female with a past medical history of paroxysmal A-fib on Eliquis, hypothyroidism, major depressive disorder, venous insufficiency and chronic diastolic heart failure that presents to the clinic for a 1 month history of nonhealing wound to the left lower extremity. She visited the ED on 10/18/2021 after a mechanical fall. She developed a hematoma that subsequently opened. She was hospitalized for 7 days and discharged on 10/25/2021. She has been on several different antibiotics For the past month. She states that most recently she was on Levaquin and linezolid for the past week. She states she completes her antibiotic course tomorrow. She has been using  Dakin's wet-to-dry dressings to the wound bed. She denies signs of infection. 4/7; patient presents for follow-up. She has been using Dakin's wet-to-dry dressings. She did end up going to the ED on 4/1 because she had excess bleeding with dressing change that she could not stop. She is on Eliquis for A-fib. In the ED they tied off a small artery. She has had no issues since discharge. She denies signs of infection. 4/14; this is a very difficult clinical situation. A patient with underlying chronic venous insufficiency and lymphedema very significant lower extremity edema had a hematoma after a fall on her left upper lateral lower leg. She is on Eliquis for atrial fibrillation apparently with a history of a splenic infarct following with Dr. Jolyn Nap of cardiology. She has exhibited significant bleeding from the wound surface including ao Venous bleeder that required suturing short while ago. She has been using Dakin's wet-to-dry packing and over the surface of the wound. She saw Dr. Caryl Comes yesterday he is reluctant to consider stopping the Eliquis because of the prior history of presumed cardioembolism. Wants to communicate with Dr. Heber [place] when she returns. In a perfect world where she was not on Eliquis she requires a wound VAC with additional compression wraps but I understand the reluctance to do this because of the concerns of bleeding 4/28; the patient's wound actually looks better today using Dakin's wet-to-dry that she is changing twice a day she is wrapping this with Kerlix and Ace wrapping. She tells me she had 2 small bleeding areas which were part of the superficial wound that stopped this week with direct pressure. Other than that no major issues. In follow-up from discussion of last week Dr. 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 The bigger question  from my point of view is the wound VAC issue. As far as she knows and her daughter-in-law verifies that she has not had any bleeding from the deeper parts of the wound although the bleeding has been superficial including the one that sent her to the ER for stitches. She is concerned that a wound VAC would cause further bleeding and I cannot completely allay those concerns. 5/8; patient presents for follow-up. She has no issues or complaints today. She has been using Dakin's wet-to-dry dressings without issues. She denies signs of infection. 5/12; patient presents for follow-up. She continues to use Dakin's wet-to-dry dressings without issues. She denies signs of infection. She reports some issues with bleeding at times but this has improved. She denies signs of infection. 6/1; patient presents for follow-up. She was recently hospitalized for upper left leg thigh cellulitis. She was given IV cefepime and vancomycin and discharged on oral antibiotics. She has been using Dakin's wet-to-dry dressings to the left lower leg wound. She reports improvement in healing. She currently denies systemic signs of infection. 6/7; patient is using Dakin's wet-to-dry twice daily. In general this looks better than when I saw this a month or so ago however still considerable depth to the tunnel in her left leg. She is going for iron infusions ordered by her primary care doctor I believe 6/15; patient presents for follow-up. She has been using Dakin's wet-to-dry dressings. She has noted some scattered small areas that blister up and heal on her left lower leg. She has been using mupirocin ointment on them. Nothing open today. 6/23; patient's been using Dakin's wet-to-dry dressings to the tunneled wound and Hydrofera Blue to the opening. She has no issues or complaints today. 6/29; patient presents for follow-up. She has been using Dakin's wet-to-dry dressings to the tunneled wound  and Hydrofera Blue to the opening. She states that [facility] is sticking to the wound bed. She denies signs of infection. 7/7; patient presents for follow-up. She has been using Dakin's wet-to-dry dressings to the tunneled wound and PolyMem silver to the opening without issues. 7/13; patient presents for follow-up. She has been using PolyMem silver to the opening and the rope to the tunnel. She has no issues or complaints today. She denies signs of infection. Electronic Signature(s) Signed: 03/03/2022 3:21:49 PM By: Geralyn Corwin DO Entered By: Geralyn Corwin on 03/03/2022 15:01:47 -------------------------------------------------------------------------------- Physical Exam Details Patient Name: Date of Service: Christine Rolling [name] 03/03/2022 2:15 PM Medical Record Number: [id number] Patient Account Number: [id number] Date of Birth/Sex: Treating RN: April 17, 1951 (71 y.o. [name] Primary Care Provider: [name] Other Clinician: Referring Provider: Treating Provider/Extender: Annamary Rummage in Treatment: 14 Constitutional respirations regular, non-labored and within target range for patient.. Cardiovascular 2+ dorsalis pedis/posterior tibialis pulses. Psychiatric pleasant and cooperative. Notes Left lower extremity: Open wound with granulation tissue to the opening and increased depth and tunneling within. No signs of surrounding infection. Electronic Signature(s) Signed: 03/03/2022 3:21:49 PM By: Geralyn Corwin DO Entered By: Geralyn Corwin on 03/03/2022 15:02:15 -------------------------------------------------------------------------------- Physician Orders Details Patient Name: Date of Service: [name], Romona Curls [name] 03/03/2022 2:15 PM Medical Record Number: [id number] Patient Account Number: [id number] Date of Birth/Sex: Treating RN: [birth year] (71 y.o. [name] Primary Care Provider: [name] Other Clinician: Referring  Provider: Treating Provider/Extender: Annamary Rummage in Treatment: 14 Verbal / Phone Orders: No Diagnosis Coding ICD-10 Coding Code Description [phone] Non-pressure chronic ulcer of other part of left lower leg with  fat layer exposed T79.8XXA Other early complications of trauma, initial encounter I48.0 Paroxysmal atrial fibrillation Z79.01 Long term (current) use of anticoagulants Follow-up Appointments ppointment in 1 week. - Dr. Heber [place] and [place], Room 8 03/10/2022 215pm Thursday Return A ppointment in 2 weeks. - Dr. Heber [place] and [place], Room 8 03/17/2022 1115am Thursday Return A Other: - Patient keep the appt with [facility] to be fitted for compression stockings. Address [place]; Phone [phone]. Bathing/ Shower/ Hygiene May shower with protection but do not get wound dressing(s) wet. Edema Control - Lymphedema / SCD / Other Elevate legs to the level of the heart or above for 30 minutes daily and/or when sitting, a frequency of: - 3-4 times a day throughout the day. Avoid standing for long periods of time. Compression stocking or Garment 20-30 mm/Hg pressure to: - patient to purchase and wear apply in the morning and remove at night. no ace wrap when wearing. Home Health New wound care orders this week; continue Home Health for wound care. May utilize formulary equivalent dressing for wound treatment orders unless otherwise specified. - change every other day dressings. Other Home Health Orders/Instructions: - [facility] Wound Treatment Wound #1 - Lower Leg Wound Laterality: Left, Lateral Cleanser: Wound Cleanser (Home Health) Every Other Day/30 Days Discharge Instructions: Cleanse the wound with wound cleanser prior to applying a clean dressing using gauze sponges, not tissue or cotton balls. Peri-Wound Care: Sween Lotion (Moisturizing lotion) Every Other Day/30 Days Discharge Instructions: Apply moisturizing lotion to left leg  with dressing. Prim Dressing: PolyMem Silver Non-Adhesive Dressing, 4.25x4.25 in [facility]) Every Other Day/30 Days ary Discharge Instructions: Apply to wound bed. Prim Dressing: Poly Mem Silver Rope Every Other Day/30 Days ary Discharge Instructions: Lightly pack into tunnel at 12. Secured With: Elastic Bandage 4 inch (ACE bandage) (Home Health) Every Other Day/30 Days Discharge Instructions: Secure with ACE bandage as directed. Secured With: [employer], 4.5x3.1 (in/yd) [facility]) Every Other Day/30 Days Discharge Instructions: Secure with Kerlix as directed. Secured With: 87M Medipore H Soft Cloth Surgical T ape, 4 x 10 (in/yd) (Home Health) Every Other Day/30 Days Discharge Instructions: Secure with tape as directed. Electronic Signature(s) Signed: 03/03/2022 3:21:49 PM By: Kalman Shan DO Entered By: Kalman Shan on 03/03/2022 15:02:22 -------------------------------------------------------------------------------- Problem List Details Patient Name: Date of Service: [name], Erick Blinks [name] 03/03/2022 2:15 PM Medical Record Number: [id number] Patient Account Number: [id number] Date of Birth/Sex: Treating RN: [birth year] (71 y.o. [name] Primary Care Provider: Deland Pretty Other Clinician: Referring Provider: Treating Provider/Extender: Judie Grieve in Treatment: 14 Active Problems ICD-10 Encounter Code Description Active Date MDM Diagnosis [phone] Non-pressure chronic ulcer of other part of left lower leg with fat layer exposed3/31/2023 No Yes T79.8XXA Other early complications of trauma, initial encounter 11/19/2021 No Yes I48.0 Paroxysmal atrial fibrillation 11/19/2021 No Yes Z79.01 Long term (current) use of anticoagulants 11/19/2021 No Yes Inactive Problems Resolved Problems Electronic Signature(s) Signed: 03/03/2022 3:21:49 PM By: Kalman Shan DO Entered By: Kalman Shan on 03/03/2022  15:01:12 -------------------------------------------------------------------------------- Progress Note Details Patient Name: Date of Service: Christine Simmonds J. 03/03/2022 2:15 PM Medical Record Number: [id number] Patient Account Number: [id number] Date of Birth/Sex: Treating RN: 01/25/1951 (71 y.o. [name] Primary Care Provider: Deland Pretty Other Clinician: Referring Provider: Treating Provider/Extender: Judie Grieve in Treatment: 14 Subjective Chief Complaint Information obtained from Patient 11/19/2021; Left lower extremity wound status post fall History of Present Illness (HPI) Admission 11/19/2021  Ms. Lafreda Ouimette is a 71 year old female with a past medical history of paroxysmal A-fib on Eliquis, hypothyroidism, major depressive disorder, venous insufficiency and chronic diastolic heart failure that presents to the clinic for a 1 month history of nonhealing wound to the left lower extremity. She visited the ED on 10/18/2021 after a mechanical fall. She developed a hematoma that subsequently opened. She was hospitalized for 7 days and discharged on 10/25/2021. She has been on several different antibiotics For the past month. She states that most recently she was on Levaquin and linezolid for the past week. She states she completes her antibiotic course tomorrow. She has been using Dakin's wet-to-dry dressings to the wound bed. She denies signs of infection. 4/7; patient presents for follow-up. She has been using Dakin's wet-to-dry dressings. She did end up going to the ED on 4/1 because she had excess bleeding with dressing change that she could not stop. She is on Eliquis for A-fib. In the ED they tied off a small artery. She has had no issues since discharge. She denies signs of infection. 4/14; this is a very difficult clinical situation. A patient with underlying chronic venous insufficiency and lymphedema very significant lower extremity  edema had a hematoma after a fall on her left upper lateral lower leg. She is on Eliquis for atrial fibrillation apparently with a history of a splenic infarct following with Dr. Jolyn Nap of cardiology. She has exhibited significant bleeding from the wound surface including ao Venous bleeder that required suturing short while ago. She has been using Dakin's wet-to-dry packing and over the surface of the wound. She saw Dr. Caryl Comes yesterday he is reluctant to consider stopping the Eliquis because of the prior history of presumed cardioembolism. Wants to communicate with Dr. Heber [place] when she returns. In a perfect world where she was not on Eliquis she requires a wound VAC with additional compression wraps but I understand the reluctance to do this because of the concerns of bleeding 4/28; the patient's wound actually looks better today using Dakin's wet-to-dry that she is changing twice a day she is wrapping this with Kerlix and Ace wrapping. She tells me she had 2 small bleeding areas which were part of the superficial wound that stopped this week with direct pressure. Other than that no major issues. In follow-up from discussion of last week Dr. 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 The bigger question from my point of view is the wound VAC issue. As far as she knows and her daughter-in-law verifies that she has not had any bleeding from the deeper parts of the wound although the bleeding has been superficial including the one that sent her to the ER for stitches. She is concerned that a wound VAC would cause further bleeding and I cannot completely allay those concerns. 5/8; patient presents for follow-up. She has no issues or complaints today. She has been using Dakin's wet-to-dry dressings without issues. She denies signs of  infection. 5/12; patient presents for follow-up. She continues to use Dakin's wet-to-dry dressings without issues. She denies signs of infection. She reports some issues with bleeding at times but this has improved. She denies signs of infection. 6/1; patient presents for follow-up. She was recently hospitalized for upper left leg thigh cellulitis. She was given IV cefepime and vancomycin and discharged on oral  antibiotics. She has been using Dakin's wet-to-dry dressings to the left lower leg wound. She reports improvement in healing. She currently denies systemic signs of infection. 6/7; patient is using Dakin's wet-to-dry twice daily. In general this looks better than when I saw this a month or so ago however still considerable depth to the tunnel in her left leg. She is going for iron infusions ordered by her primary care doctor I believe 6/15; patient presents for follow-up. She has been using Dakin's wet-to-dry dressings. She has noted some scattered small areas that blister up and heal on her left lower leg. She has been using mupirocin ointment on them. Nothing open today. 6/23; patient's been using Dakin's wet-to-dry dressings to the tunneled wound and Hydrofera Blue to the opening. She has no issues or complaints today. 6/29; patient presents for follow-up. She has been using Dakin's wet-to-dry dressings to the tunneled wound and Hydrofera Blue to the opening. She states that [facility] is sticking to the wound bed. She denies signs of infection. 7/7; patient presents for follow-up. She has been using Dakin's wet-to-dry dressings to the tunneled wound and PolyMem silver to the opening without issues. 7/13; patient presents for follow-up. She has been using PolyMem silver to the opening and the rope to the tunnel. She has no issues or complaints today. She denies signs of infection. Patient History Medical History Cardiovascular Patient has history of Congestive Heart Failure,  Hypertension Hospitalization/Surgery History - Cellulitis left leg- 01/03/2022-01/07/2022. Medical A Surgical History Notes nd Constitutional Symptoms (General Health) Infarction of spleen Hematologic/Lymphatic Hypothyroidism Cardiovascular A-Fib Gastrointestinal Gastroesophageal reflux Genitourinary Chronic kidney disease Musculoskeletal Osteoarthritis of knee Psychiatric Anxiety Objective Constitutional respirations regular, non-labored and within target range for patient.. Vitals Time Taken: 2:15 PM, Height: 67 in, Weight: 340 lbs, BMI: 53.2, Temperature: 97.9 F, Pulse: 60 bpm, Respiratory Rate: 18 breaths/min, Blood Pressure: 168/79 mmHg. Cardiovascular 2+ dorsalis pedis/posterior tibialis pulses. Psychiatric pleasant and cooperative. General Notes: Left lower extremity: Open wound with granulation tissue to the opening and increased depth and tunneling within. No signs of surrounding infection. Integumentary (Hair, Skin) Wound #1 status is Open. Original cause of wound was Trauma. The date acquired was: 10/18/2021. The wound has been in treatment 14 weeks. The wound is located on the Left,Lateral Lower Leg. The wound measures 2.3cm length x 3.4cm width x 1.8cm depth; 6.142cm^2 area and 11.055cm^3 volume. There is muscle and Fat Layer (Subcutaneous Tissue) exposed. There is no undermining noted, however, there is tunneling at 12:00 with a maximum distance of 5cm. There is a medium amount of sanguinous drainage noted. The wound margin is distinct with the outline attached to the wound base. There is large (67-100%) red granulation within the wound bed. There is no necrotic tissue within the wound bed. Assessment Active Problems ICD-10 Non-pressure chronic ulcer of other part of left lower leg with fat layer exposed Other early complications of trauma, initial encounter Paroxysmal atrial fibrillation Long term (current) use of anticoagulants Patient's wound has shown  improvement in size and appearance since last clinic visit. I recommended continuing the course with PolyMem silver. Follow-up in 1 week. Plan Follow-up Appointments: Return Appointment in 1 week. - Dr. Heber [place] and [place], Room 8 03/10/2022 215pm Thursday Return Appointment in 2 weeks. - Dr. Heber [place] and [place], Room 8 03/17/2022 1115am Thursday Other: - Patient keep the appt with [facility] to be fitted for compression stockings. Address [place]; Phone [phone]. Bathing/ Shower/ Hygiene: May shower with protection but do not  get wound dressing(s) wet. Edema Control - Lymphedema / SCD / Other: Elevate legs to the level of the heart or above for 30 minutes daily and/or when sitting, a frequency of: - 3-4 times a day throughout the day. Avoid standing for long periods of time. Compression stocking or Garment 20-30 mm/Hg pressure to: - patient to purchase and wear apply in the morning and remove at night. no ace wrap when wearing. Home Health: New wound care orders this week; continue Home Health for wound care. May utilize formulary equivalent dressing for wound treatment orders unless otherwise specified. - change every other day dressings. Other Home Health Orders/Instructions: - [facility] WOUND #1: - Lower Leg Wound Laterality: Left, Lateral Cleanser: Wound Cleanser (Home Health) Every Other Day/30 Days Discharge Instructions: Cleanse the wound with wound cleanser prior to applying a clean dressing using gauze sponges, not tissue or cotton balls. Peri-Wound Care: Sween Lotion (Moisturizing lotion) Every Other Day/30 Days Discharge Instructions: Apply moisturizing lotion to left leg with dressing. Prim Dressing: PolyMem Silver Non-Adhesive Dressing, 4.25x4.25 in [facility]) Every Other Day/30 Days ary Discharge Instructions: Apply to wound bed. Prim Dressing: Poly Mem Silver Rope Every Other Day/30 Days ary Discharge Instructions: Lightly pack into tunnel at  12. Secured With: Elastic Bandage 4 inch (ACE bandage) (Home Health) Every Other Day/30 Days Discharge Instructions: Secure with ACE bandage as directed. Secured With: [employer], 4.5x3.1 (in/yd) [facility]) Every Other Day/30 Days Discharge Instructions: Secure with Kerlix as directed. Secured With: 65M Medipore H Soft Cloth Surgical T ape, 4 x 10 (in/yd) (Home Health) Every Other Day/30 Days Discharge Instructions: Secure with tape as directed. 1. PolyMem silver 2. Follow up in 2 weeks Electronic Signature(s) Signed: 03/03/2022 3:21:49 PM By: Kalman Shan DO Entered By: Kalman Shan on 03/03/2022 15:03:54 -------------------------------------------------------------------------------- HxROS Details Patient Name: Date of Service: [name], Christine [name] 03/03/2022 2:15 PM Medical Record Number: [id number] Patient Account Number: [id number] Date of Birth/Sex: Treating RN: [birth year] (71 y.o. [name] Primary Care Provider: Deland Pretty Other Clinician: Referring Provider: Treating Provider/Extender: Judie Grieve in Treatment: 14 Constitutional Symptoms (General Health) Medical History: Past Medical History Notes: Infarction of spleen Hematologic/Lymphatic Medical History: Past Medical History Notes: Hypothyroidism Cardiovascular Medical History: Positive for: Congestive Heart Failure; Hypertension Past Medical History Notes: A-Fib Gastrointestinal Medical History: Past Medical History Notes: Gastroesophageal reflux Genitourinary Medical History: Past Medical History Notes: Chronic kidney disease Musculoskeletal Medical History: Past Medical History Notes: Osteoarthritis of knee Psychiatric Medical History: Past Medical History Notes: Anxiety Immunizations Pneumococcal Vaccine: Received Pneumococcal Vaccination: No Implantable Devices No devices added Hospitalization / Surgery History Type of  Hospitalization/Surgery Cellulitis left leg- 01/03/2022-01/07/2022 Electronic Signature(s) Signed: 03/03/2022 3:21:49 PM By: Kalman Shan DO Signed: 03/03/2022 4:34:49 PM By: Deon Pilling RN, BSN Entered By: Kalman Shan on 03/03/2022 15:01:58 -------------------------------------------------------------------------------- SuperBill Details Patient Name: Date of Service: Christine Simmonds J. 03/03/2022 Medical Record Number: [id number] Patient Account Number: [id number] Date of Birth/Sex: Treating RN: 03-09-1951 (71 y.o. [name] Primary Care Provider: Deland Pretty Other Clinician: Referring Provider: Treating Provider/Extender: Judie Grieve in Treatment: 14 Diagnosis Coding ICD-10 Codes Code Description [phone] Non-pressure chronic ulcer of other part of left lower leg with fat layer exposed T79.8XXA Other early complications of trauma, initial encounter I48.0 Paroxysmal atrial fibrillation Z79.01 Long term (current) use of anticoagulants Facility Procedures CPT4 Code: [id number] Description: 99214 - WOUND CARE VISIT-LEV 4 EST PT Modifier: Quantity: 1 Physician Procedures : CPT4 Code Description  Modifier [id number] 99213 - WC PHYS LEVEL 3 - EST PT ICD-10 Diagnosis Description L97.822 Non-pressure chronic ulcer of other part of left lower leg with fat layer exposed T79.8XXA Other early complications of trauma, initial  encounter I48.0 Paroxysmal atrial fibrillation Z79.01 Long term (current) use of anticoagulants Quantity: 1 Electronic Signature(s) Signed: 03/03/2022 3:21:49 PM By: Geralyn Corwin DO Entered By: Geralyn Corwin on 03/03/2022 15:06:54

## 2022-03-03 NOTE — Progress Notes (Signed)
SHERYLE, VICE ([id number]) Visit Report for 03/03/2022 Arrival Information Details Patient Name: Date of Service: [name] 03/03/2022 2:15 PM Medical Record Number: [id number] Patient Account Number: [id number] Date of Birth/Sex: Treating RN: 25-Sep-1950 (71 y.o. [name], [name] Primary Care Maat Kafer: Deland Pretty Other Clinician: Referring Mikiah Demond: Treating Alexarae Oliva/Extender: Judie Grieve in Treatment: 14 Visit Information History Since Last Visit Added or deleted any medications: No Patient Arrived: [name] Any new allergies or adverse reactions: No Arrival Time: 14:14 Had a fall or experienced change in No Accompanied By: son activities of daily living that may affect Transfer Assistance: None risk of falls: Patient Identification Verified: Yes Signs or symptoms of abuse/neglect since last visito No Secondary Verification Process Completed: Yes Hospitalized since last visit: No Patient Requires Transmission-Based Precautions: No Implantable device outside of the clinic excluding No Patient Has Alerts: Yes cellular tissue based products placed in the center Patient Alerts: Patient on Blood Thinner since last visit: Has Dressing in Place as Prescribed: Yes Pain Present Now: No Electronic Signature(s) Signed: 03/03/2022 4:37:01 PM By: Erenest Blank Entered By: Erenest Blank on 03/03/2022 14:14:53 -------------------------------------------------------------------------------- Clinic Level of Care Assessment Details Patient Name: Date of Service: [name] 03/03/2022 2:15 PM Medical Record Number: [id number] Patient Account Number: [id number] Date of Birth/Sex: Treating RN: Aug 05, 1951 (71 y.o. [name], [name] Primary Care Julaine Zimny: Deland Pretty Other Clinician: Referring Markail Diekman: Treating Lyann Hagstrom/Extender: Judie Grieve in Treatment: 14 Clinic Level of Care Assessment Items TOOL 4 Quantity  Score X- 1 0 Use when only an EandM is performed on FOLLOW-UP visit ASSESSMENTS - Nursing Assessment / Reassessment X- 1 10 Reassessment of Co-morbidities (includes updates in patient status) X- 1 5 Reassessment of Adherence to Treatment Plan ASSESSMENTS - Wound and Skin A ssessment / Reassessment X - Simple Wound Assessment / Reassessment - one wound 1 5 []  - 0 Complex Wound Assessment / Reassessment - multiple wounds X- 1 10 Dermatologic / Skin Assessment (not related to wound area) ASSESSMENTS - Focused Assessment X- 1 5 Circumferential Edema Measurements - multi extremities X- 1 10 Nutritional Assessment / Counseling / Intervention []  - 0 Lower Extremity Assessment (monofilament, tuning fork, pulses) []  - 0 Peripheral Arterial Disease Assessment (using hand held doppler) ASSESSMENTS - Ostomy and/or Continence Assessment and Care []  - 0 Incontinence Assessment and Management []  - 0 Ostomy Care Assessment and Management (repouching, etc.) PROCESS - Coordination of Care []  - 0 Simple Patient / Family Education for ongoing care X- 1 20 Complex (extensive) Patient / Family Education for ongoing care X- 1 10 Staff obtains Programmer, systems, Records, T Results / Process Orders est X- 1 10 Staff telephones HHA, Nursing Homes / Clarify orders / etc []  - 0 Routine Transfer to another Facility (non-emergent condition) []  - 0 Routine Hospital Admission (non-emergent condition) []  - 0 New Admissions / Biomedical engineer / Ordering NPWT Apligraf, etc. , []  - 0 Emergency Hospital Admission (emergent condition) []  - 0 Simple Discharge Coordination X- 1 15 Complex (extensive) Discharge Coordination PROCESS - Special Needs []  - 0 Pediatric / Minor Patient Management []  - 0 Isolation Patient Management []  - 0 Hearing / Language / Visual special needs []  - 0 Assessment of Community assistance (transportation, D/C planning, etc.) []  - 0 Additional assistance / Altered  mentation []  - 0 Support Surface(s) Assessment (bed, cushion, seat, etc.) INTERVENTIONS - Wound Cleansing / Measurement []  - 0 Simple Wound Cleansing - one wound X- 1 5 Complex Wound  Cleansing - multiple wounds X- 1 5 Wound Imaging (photographs - any number of wounds) []  - 0 Wound Tracing (instead of photographs) []  - 0 Simple Wound Measurement - one wound X- 1 5 Complex Wound Measurement - multiple wounds INTERVENTIONS - Wound Dressings []  - 0 Small Wound Dressing one or multiple wounds X- 1 15 Medium Wound Dressing one or multiple wounds []  - 0 Large Wound Dressing one or multiple wounds []  - 0 Application of Medications - topical []  - 0 Application of Medications - injection INTERVENTIONS - Miscellaneous []  - 0 External ear exam []  - 0 Specimen Collection (cultures, biopsies, blood, body fluids, etc.) []  - 0 Specimen(s) / Culture(s) sent or taken to Lab for analysis []  - 0 Patient Transfer (multiple staff / Civil Service fast streamer / Similar devices) []  - 0 Simple Staple / Suture removal (25 or less) []  - 0 Complex Staple / Suture removal (26 or more) []  - 0 Hypo / Hyperglycemic Management (close monitor of Blood Glucose) []  - 0 Ankle / Brachial Index (ABI) - do not check if billed separately X- 1 5 Vital Signs Has the patient been seen at the hospital within the last three years: Yes Total Score: 135 Level Of Care: New/Established - Level 4 Electronic Signature(s) Signed: 03/03/2022 4:34:49 PM By: Deon Pilling RN, BSN Entered By: Deon Pilling on 03/03/2022 14:57:17 -------------------------------------------------------------------------------- Encounter Discharge Information Details Patient Name: Date of Service: Christine Debar [name] 03/03/2022 2:15 PM Medical Record Number: [id number] Patient Account Number: [id number] Date of Birth/Sex: Treating RN: 21-Aug-1951 (71 y.o. [name] Primary Care Ebony Rickel: Deland Pretty Other Clinician: Referring  Talyia Allende: Treating Mingo Siegert/Extender: Judie Grieve in Treatment: 14 Encounter Discharge Information Items Discharge Condition: Stable Ambulatory Status: Walker Discharge Destination: Home Transportation: Private Auto Accompanied By: son Schedule Follow-up Appointment: Yes Clinical Summary of Care: Electronic Signature(s) Signed: 03/03/2022 4:34:49 PM By: Deon Pilling RN, BSN Entered By: Deon Pilling on 03/03/2022 14:58:07 -------------------------------------------------------------------------------- Lower Extremity Assessment Details Patient Name: Date of Service: [name]. 03/03/2022 2:15 PM Medical Record Number: [id number] Patient Account Number: [id number] Date of Birth/Sex: Treating RN: 03-May-1951 (71 y.o. [name] Primary Care Elsie Baynes: Deland Pretty Other Clinician: Referring Charlise Giovanetti: Treating Ziggy Chanthavong/Extender: Judie Grieve in Treatment: 14 Edema Assessment Assessed: [name]: No] [name]: No] Edema: [Left: Ye] [Right: s] Calf Left: Right: Point of Measurement: 31 cm From Medial Instep 54 cm Ankle Left: Right: Point of Measurement: 9 cm From Medial Instep 27 cm Electronic Signature(s) Signed: 03/03/2022 4:34:49 PM By: Deon Pilling RN, BSN Signed: 03/03/2022 4:37:01 PM By: Erenest Blank Entered By: Erenest Blank on 03/03/2022 14:31:46 -------------------------------------------------------------------------------- Multi Wound Chart Details Patient Name: Date of Service: [name]. 03/03/2022 2:15 PM Medical Record Number: [id number] Patient Account Number: [id number] Date of Birth/Sex: Treating RN: 1950-09-26 (71 y.o. [name] Primary Care Jaydeen Darley: Deland Pretty Other Clinician: Referring [name]: Treating Gerrad Welker/Extender: Judie Grieve in Treatment: 14 Vital Signs Height(in): 67 Pulse(bpm): 60 Weight(lbs): 340 Blood Pressure(mmHg):  168/79 Body Mass Index(BMI): 53.2 Temperature(F): 97.9 Respiratory Rate(breaths/min): 18 Photos: [N/A:N/A] Left, Lateral Lower Leg N/A N/A Wound Location: Trauma N/A N/A Wounding Event: Trauma, Other N/A N/A Primary Etiology: Congestive Heart Failure, N/A N/A Comorbid History: Hypertension 10/18/2021 N/A N/A Date Acquired: 14 N/A N/A Weeks of Treatment: Open N/A N/A Wound Status: No N/A N/A Wound Recurrence: Yes N/A N/A Clustered Wound: 1 N/A N/A Clustered Quantity: 2.3x3.4x1.8 N/A N/A Measurements L x W x D (  cm) 6.142 N/A N/A A (cm) : rea 11.055 N/A N/A Volume (cm) : 96.80% N/A N/A % Reduction in A rea: 99.30% N/A N/A % Reduction in Volume: 12 Position 1 (o'clock): 5 Maximum Distance 1 (cm): Yes N/A N/A Tunneling: Full Thickness With Exposed Support N/A N/A Classification: Structures Medium N/A N/A Exudate Amount: Sanguinous N/A N/A Exudate Type: red N/A N/A Exudate Color: Distinct, outline attached N/A N/A Wound Margin: Large (67-100%) N/A N/A Granulation Amount: Red N/A N/A Granulation Quality: None Present (0%) N/A N/A Necrotic Amount: Fat Layer (Subcutaneous Tissue): Yes N/A N/A Exposed Structures: Muscle: Yes Fascia: No Tendon: No Joint: No Bone: No Small (1-33%) N/A N/A Epithelialization: Treatment Notes Wound #1 (Lower Leg) Wound Laterality: Left, Lateral Cleanser Wound Cleanser Discharge Instruction: Cleanse the wound with wound cleanser prior to applying a clean dressing using gauze sponges, not tissue or cotton balls. Peri-Wound Care Sween Lotion (Moisturizing lotion) Discharge Instruction: Apply moisturizing lotion to left leg with dressing. Topical Primary Dressing PolyMem Silver Non-Adhesive Dressing, 4.25x4.25 in Discharge Instruction: Apply to wound bed. Poly Mem Silver Rope Discharge Instruction: Lightly pack into tunnel at 12. Secondary Dressing Secured With Elastic Bandage 4 inch (ACE bandage) Discharge  Instruction: Secure with ACE bandage as directed. Kerlix Roll Sterile, 4.5x3.1 (in/yd) Discharge Instruction: Secure with Kerlix as directed. 48M Medipore H Soft Cloth Surgical T ape, 4 x 10 (in/yd) Discharge Instruction: Secure with tape as directed. Compression Wrap Compression Stockings Add-Ons Electronic Signature(s) Signed: 03/03/2022 3:21:49 PM By: Kalman Shan DO Signed: 03/03/2022 4:34:49 PM By: Deon Pilling RN, BSN Entered By: Kalman Shan on 03/03/2022 15:01:16 -------------------------------------------------------------------------------- Multi-Disciplinary Care Plan Details Patient Name: Date of Service: [name], Christine Morelle. 03/03/2022 2:15 PM Medical Record Number: [id number] Patient Account Number: [id number] Date of Birth/Sex: Treating RN: Sep 04, 1950 (71 y.o. [name], [name] Primary Care Kristalyn Bergstresser: Deland Pretty Other Clinician: Referring Delainy Mcelhiney: Treating Michaeljoseph Revolorio/Extender: Judie Grieve in Treatment: 14 Active Inactive Abuse / Safety / Falls / Self Care Management Nursing Diagnoses: History of Falls Potential for falls Goals: Patient/caregiver will verbalize/demonstrate measure taken to improve self care Date Initiated: 11/19/2021 Target Resolution Date: 03/18/2022 Goal Status: Active Patient/caregiver will verbalize/demonstrate measures taken to prevent injury and/or falls Date Initiated: 11/19/2021 Target Resolution Date: 03/18/2022 Goal Status: Active Interventions: Provide education on basic hygiene Provide education on fall prevention Provide education on HBO safety Provide education on vaccinations Notes: Pain, Acute or Chronic Nursing Diagnoses: Pain, acute or chronic: actual or potential Potential alteration in comfort, pain Goals: Patient will verbalize adequate pain control and receive pain control interventions during procedures as needed Date Initiated: 11/19/2021 Target Resolution Date: 03/18/2022 Goal Status:  Active Patient/caregiver will verbalize comfort level met Date Initiated: 11/19/2021 Target Resolution Date: 03/18/2022 Goal Status: Active Interventions: Encourage patient to take pain medications as prescribed Provide education on pain management Reposition patient for comfort Treatment Activities: Administer pain control measures as ordered : 11/19/2021 Notes: Electronic Signature(s) Signed: 03/03/2022 4:34:49 PM By: Deon Pilling RN, BSN Entered By: Deon Pilling on 03/03/2022 14:25:24 -------------------------------------------------------------------------------- Pain Assessment Details Patient Name: Date of Service: Christine Simmonds J. 03/03/2022 2:15 PM Medical Record Number: [id number] Patient Account Number: [id number] Date of Birth/Sex: Treating RN: 1950/12/31 (71 y.o. [name] Primary Care Ellianne Gowen: Deland Pretty Other Clinician: Referring Zandyr Barnhill: Treating Shameca Landen/Extender: Judie Grieve in Treatment: 14 Active Problems Location of Pain Severity and Description of Pain Patient Has Paino No Site Locations Pain Management and Medication Current Pain Management: Electronic Signature(s) Signed: 03/03/2022  4:34:49 PM By: Deon Pilling RN, BSN Signed: 03/03/2022 4:37:01 PM By: Erenest Blank Entered By: Erenest Blank on 03/03/2022 14:15:35 -------------------------------------------------------------------------------- Patient/Caregiver Education Details Patient Name: Date of Service: [name] 7/13/2023andnbsp2:15 PM Medical Record Number: [id number] Patient Account Number: [id number] Date of Birth/Gender: Treating RN: 04/27/1951 (71 y.o. [name] Primary Care Physician: Deland Pretty Other Clinician: Referring Physician: Treating Physician/Extender: Judie Grieve in Treatment: 14 Education Assessment Education Provided To: Patient Education Topics Provided Wound/Skin  Impairment: Handouts: Skin Care Do's and Dont's Methods: Explain/Verbal Responses: Reinforcements needed Electronic Signature(s) Signed: 03/03/2022 4:34:49 PM By: Deon Pilling RN, BSN Entered By: Deon Pilling on 03/03/2022 14:25:38 -------------------------------------------------------------------------------- Wound Assessment Details Patient Name: Date of Service: [name]. 03/03/2022 2:15 PM Medical Record Number: [id number] Patient Account Number: [id number] Date of Birth/Sex: Treating RN: 1951/06/16 (71 y.o. [name], [name] Primary Care Eowyn Tabone: Deland Pretty Other Clinician: Referring Neyah Ellerman: Treating Brandis Wixted/Extender: Judie Grieve in Treatment: 14 Wound Status Wound Number: 1 Primary Etiology: Trauma, Other Wound Location: Left, Lateral Lower Leg Wound Status: Open Wounding Event: Trauma Comorbid History: Congestive Heart Failure, Hypertension Date Acquired: 10/18/2021 Weeks Of Treatment: 14 Clustered Wound: Yes Photos Wound Measurements Length: (cm) 2.3 Width: (cm) 3.4 Depth: (cm) 1.8 Clustered Quantity: 1 Area: (cm) 6.142 Volume: (cm) 11.055 % Reduction in Area: 96.8% % Reduction in Volume: 99.3% Epithelialization: Small (1-33%) Tunneling: Yes Position (o'clock): 12 Maximum Distance: (cm) 5 Undermining: No Wound Description Classification: Full Thickness With Exposed Support Structures Wound Margin: Distinct, outline attached Exudate Amount: Medium Exudate Type: Sanguinous Exudate Color: red Foul Odor After Cleansing: No Slough/Fibrino No Wound Bed Granulation Amount: Large (67-100%) Exposed Structure Granulation Quality: Red Fascia Exposed: No Necrotic Amount: None Present (0%) Fat Layer (Subcutaneous Tissue) Exposed: Yes Tendon Exposed: No Muscle Exposed: Yes Necrosis of Muscle: No Joint Exposed: No Bone Exposed: No Treatment Notes Wound #1 (Lower Leg) Wound Laterality: Left, Lateral Cleanser Wound  Cleanser Discharge Instruction: Cleanse the wound with wound cleanser prior to applying a clean dressing using gauze sponges, not tissue or cotton balls. Peri-Wound Care Sween Lotion (Moisturizing lotion) Discharge Instruction: Apply moisturizing lotion to left leg with dressing. Topical Primary Dressing PolyMem Silver Non-Adhesive Dressing, 4.25x4.25 in Discharge Instruction: Apply to wound bed. Poly Mem Silver Rope Discharge Instruction: Lightly pack into tunnel at 12. Secondary Dressing Secured With Elastic Bandage 4 inch (ACE bandage) Discharge Instruction: Secure with ACE bandage as directed. Kerlix Roll Sterile, 4.5x3.1 (in/yd) Discharge Instruction: Secure with Kerlix as directed. 16M Medipore H Soft Cloth Surgical T ape, 4 x 10 (in/yd) Discharge Instruction: Secure with tape as directed. Compression Wrap Compression Stockings Add-Ons Electronic Signature(s) Signed: 03/03/2022 4:34:49 PM By: Deon Pilling RN, BSN Entered By: Deon Pilling on 03/03/2022 14:53:43 -------------------------------------------------------------------------------- Vitals Details Patient Name: Date of Service: [name], Christine [name] 03/03/2022 2:15 PM Medical Record Number: [id number] Patient Account Number: [id number] Date of Birth/Sex: Treating RN: [birth year] (71 y.o. [name], [name] Primary Care Angus Amini: Deland Pretty Other Clinician: Referring Michell Giuliano: Treating Dhriti Fales/Extender: Judie Grieve in Treatment: 14 Vital Signs Time Taken: 14:15 Temperature (F): 97.9 Height (in): 67 Pulse (bpm): 60 Weight (lbs): 340 Respiratory Rate (breaths/min): 18 Body Mass Index (BMI): 53.2 Blood Pressure (mmHg): 168/79 Reference Range: 80 - 120 mg / dl Electronic Signature(s) Signed: 03/03/2022 4:37:01 PM By: Erenest Blank Entered By: Erenest Blank on 03/03/2022 14:15:28

## 2022-03-10 ENCOUNTER — Encounter (HOSPITAL_BASED_OUTPATIENT_CLINIC_OR_DEPARTMENT_OTHER): Payer: PPO | Admitting: Internal Medicine

## 2022-03-10 DIAGNOSIS — L97822 Non-pressure chronic ulcer of other part of left lower leg with fat layer exposed: Secondary | ICD-10-CM

## 2022-03-10 DIAGNOSIS — I480 Paroxysmal atrial fibrillation: Secondary | ICD-10-CM | POA: Diagnosis not present

## 2022-03-10 DIAGNOSIS — Z7901 Long term (current) use of anticoagulants: Secondary | ICD-10-CM

## 2022-03-10 DIAGNOSIS — T798XXA Other early complications of trauma, initial encounter: Secondary | ICD-10-CM

## 2022-03-10 DIAGNOSIS — D500 Iron deficiency anemia secondary to blood loss (chronic): Secondary | ICD-10-CM | POA: Diagnosis not present

## 2022-03-10 DIAGNOSIS — E871 Hypo-osmolality and hyponatremia: Secondary | ICD-10-CM | POA: Diagnosis not present

## 2022-03-10 DIAGNOSIS — E039 Hypothyroidism, unspecified: Secondary | ICD-10-CM | POA: Diagnosis not present

## 2022-03-10 NOTE — Progress Notes (Signed)
Christine, [name] ([id number]) Visit Report for 03/10/2022 Arrival Information Details Patient Name: Date of Service: [name] 03/10/2022 2:15 PM Medical Record Number: [id number] Patient Account Number: [id number] Date of Birth/Sex: Treating RN: August 04, 1951 (71 y.o. [name], Tammi Klippel Primary Care Isham Smitherman: Deland Pretty Other Clinician: Referring Donney Caraveo: Treating Kemuel Buchmann/Extender: Judie Grieve in Treatment: 15 Visit Information History Since Last Visit Added or deleted any medications: No Patient Arrived: [name] Any new allergies or adverse reactions: No Arrival Time: 14:20 Had a fall or experienced change in No Accompanied By: self activities of daily living that may affect Transfer Assistance: None risk of falls: Patient Identification Verified: Yes Signs or symptoms of abuse/neglect since last visito No Secondary Verification Process Completed: Yes Hospitalized since last visit: No Patient Requires Transmission-Based Precautions: No Implantable device outside of the clinic excluding No Patient Has Alerts: Yes cellular tissue based products placed in the center Patient Alerts: Patient on Blood Thinner since last visit: Has Dressing in Place as Prescribed: Yes Pain Present Now: No Notes per patient picks up her compression stockings tomorrow. Electronic Signature(s) Signed: 03/10/2022 5:46:02 PM By: Deon Pilling RN, BSN Entered By: Deon Pilling on 03/10/2022 14:25:38 -------------------------------------------------------------------------------- Clinic Level of Care Assessment Details Patient Name: Date of Service: [name] 03/10/2022 2:15 PM Medical Record Number: [id number] Patient Account Number: [id number] Date of Birth/Sex: Treating RN: 11-15-1950 (71 y.o. [name], Meta.Reding Primary Care Chanler Mendonca: Deland Pretty Other Clinician: Referring Winfrey Chillemi: Treating Nioma Mccubbins/Extender: Judie Grieve in  Treatment: 15 Clinic Level of Care Assessment Items TOOL 4 Quantity Score X- 1 0 Use when only an EandM is performed on FOLLOW-UP visit ASSESSMENTS - Nursing Assessment / Reassessment X- 1 10 Reassessment of Co-morbidities (includes updates in patient status) X- 1 5 Reassessment of Adherence to Treatment Plan ASSESSMENTS - Wound and Skin A ssessment / Reassessment X - Simple Wound Assessment / Reassessment - one wound 1 5 _0  - 0 Complex Wound Assessment / Reassessment - multiple wounds X- 1 10 Dermatologic / Skin Assessment (not related to wound area) ASSESSMENTS - Focused Assessment X- 1 5 Circumferential Edema Measurements - multi extremities _1  - 0 Nutritional Assessment / Counseling / Intervention _2  - 0 Lower Extremity Assessment (monofilament, tuning fork, pulses) _3  - 0 Peripheral Arterial Disease Assessment (using hand held doppler) ASSESSMENTS - Ostomy and/or Continence Assessment and Care _4  - 0 Incontinence Assessment and Management _5  - 0 Ostomy Care Assessment and Management (repouching, etc.) PROCESS - Coordination of Care X - Simple Patient / Family Education for ongoing care 1 15 _6  - 0 Complex (extensive) Patient / Family Education for ongoing care X- 1 10 Staff obtains Programmer, systems, Records, T Results / Process Orders est X- 1 10 Staff telephones HHA, Nursing Homes / Clarify orders / etc _7  - 0 Routine Transfer to another Facility (non-emergent condition) _8  - 0 Routine Hospital Admission (non-emergent condition) _9  - 0 New Admissions / Biomedical engineer / Ordering NPWT Apligraf, etc. , _10  - 0 Emergency Hospital Admission (emergent condition) X- 1 10 Simple Discharge Coordination _11  - 0 Complex (extensive) Discharge Coordination PROCESS - Special Needs _12  - 0 Pediatric / Minor Patient Management _13  - 0 Isolation Patient Management _14  - 0 Hearing / Language / Visual special needs _15  - 0 Assessment of Community assistance (transportation,  D/C planning, etc.) _16  - 0 Additional assistance / Altered mentation _17  - 0 Support Surface(s) Assessment (bed, cushion, seat, etc.) INTERVENTIONS - Wound Cleansing / Measurement X -  Simple Wound Cleansing - one wound 1 5 _0  - 0 Complex Wound Cleansing - multiple wounds X- 1 5 Wound Imaging (photographs - any number of wounds) _1  - 0 Wound Tracing (instead of photographs) X- 1 5 Simple Wound Measurement - one wound _2  - 0 Complex Wound Measurement - multiple wounds INTERVENTIONS - Wound Dressings X - Small Wound Dressing one or multiple wounds 1 10 _3  - 0 Medium Wound Dressing one or multiple wounds _4  - 0 Large Wound Dressing one or multiple wounds <ZOXWRUEAVWUJWJXB>_1<\/YNWGNFAOZHYQMVHQ>_4  - 0 Application of Medications - topical <ONGEXBMWUXLKGMWN>_0<\/UVOZDGUYQIHKVQQV>_9  - 0 Application of Medications - injection INTERVENTIONS - Miscellaneous _7  - 0 External ear exam _8  - 0 Specimen Collection (cultures, biopsies, blood, body fluids, etc.) _9  - 0 Specimen(s) / Culture(s) sent or taken to Lab for analysis _10  - 0 Patient Transfer (multiple staff / Civil Service fast streamer / Similar devices) _11  - 0 Simple Staple / Suture removal (25 or less) _12  - 0 Complex Staple / Suture removal (26 or more) _13  - 0 Hypo / Hyperglycemic Management (close monitor of Blood Glucose) _14  - 0 Ankle / Brachial Index (ABI) - do not check if billed separately X- 1 5 Vital Signs Has the patient been seen at the hospital within the last three years: Yes Total Score: 110 Level Of Care: New/Established - Level 3 Electronic Signature(s) Signed: 03/10/2022 5:46:02 PM By: Deon Pilling RN, BSN Entered By: Deon Pilling on 03/10/2022 15:24:01 -------------------------------------------------------------------------------- Encounter Discharge Information Details Patient Name: Date of Service: [name]. 03/10/2022 2:15 PM Medical Record Number: [id number] Patient Account Number: [id number] Date of Birth/Sex: Treating RN: [birth year] (71 y.o. [name] Primary Care  Perlie Scheuring: Deland Pretty Other Clinician: Referring Edye Hainline: Treating Jamarl Pew/Extender: Judie Grieve in Treatment: 15 Encounter Discharge Information Items Discharge Condition: Stable Ambulatory Status: Walker Discharge Destination: Home Transportation: Private Auto Accompanied By: self Schedule Follow-up Appointment: Yes Clinical Summary of Care: Electronic Signature(s) Signed: 03/10/2022 5:46:02 PM By: Deon Pilling RN, BSN Entered By: Deon Pilling on 03/10/2022 15:24:38 -------------------------------------------------------------------------------- Lower Extremity Assessment Details Patient Name: Date of Service: [name] 03/10/2022 2:15 PM Medical Record Number: [id number] Patient Account Number: [id number] Date of Birth/Sex: Treating RN: 02-20-1951 (71 y.o. [name] Primary Care Ziaire Hagos: Deland Pretty Other Clinician: Referring Aleatha Taite: Treating Yarely Bebee/Extender: Judie Grieve in Treatment: 15 Edema Assessment Assessed: Shirlyn Goltz: Yes] Patrice Paradise: No] Edema: [Left: Ye] [Right: s] Calf Left: Right: Point of Measurement: 31 cm From Medial Instep 48 cm Ankle Left: Right: Point of Measurement: 9 cm From Medial Instep 25 cm Electronic Signature(s) Signed: 03/10/2022 5:46:02 PM By: Deon Pilling RN, BSN Entered By: Deon Pilling on 03/10/2022 14:30:27 -------------------------------------------------------------------------------- Multi Wound Chart Details Patient Name: Date of Service: [name] 03/10/2022 2:15 PM Medical Record Number: [id number] Patient Account Number: [id number] Date of Birth/Sex: Treating RN: [birth year] (71 y.o. [name] Primary Care Coda Filler: Deland Pretty Other Clinician: Referring Bird Swetz: Treating Login Muckleroy/Extender: Judie Grieve in Treatment: 15 Vital Signs Height(in): 67 Pulse(bpm): 60 Weight(lbs): 340 Blood Pressure(mmHg):  179/77 Body Mass Index(BMI): 53.2 Temperature(F): 98.8 Respiratory Rate(breaths/min): 20 Photos: [N/A:N/A] Left, Lateral Lower Leg N/A N/A Wound Location: Trauma N/A N/A Wounding Event: Trauma, Other N/A N/A Primary Etiology: Congestive Heart Failure, N/A N/A Comorbid History: Hypertension 10/18/2021 N/A N/A Date Acquired: 15 N/A N/A Weeks of Treatment: Open N/A N/A Wound Status: No N/A N/A Wound Recurrence: Yes N/A N/A Clustered Wound: 1 N/A N/A Clustered Quantity: 1x2.7x2 N/A  N/A Measurements L x W x D (cm) 2.121 N/A N/A A (cm) : rea 4.241 N/A N/A Volume (cm) : 98.90% N/A N/A % Reduction in A rea: 99.70% N/A N/A % Reduction in Volume: 12 Position 1 (o'clock): 4.2 Maximum Distance 1 (cm): Yes N/A N/A Tunneling: Full Thickness Without Exposed N/A N/A Classification: Support Structures Medium N/A N/A Exudate Amount: Serosanguineous N/A N/A Exudate Type: red, brown N/A N/A Exudate Color: Distinct, outline attached N/A N/A Wound Margin: Large (67-100%) N/A N/A Granulation Amount: Red N/A N/A Granulation Quality: None Present (0%) N/A N/A Necrotic Amount: Fat Layer (Subcutaneous Tissue): Yes N/A N/A Exposed Structures: Fascia: No Tendon: No Muscle: No Joint: No Bone: No Small (1-33%) N/A N/A Epithelialization: Treatment Notes Wound #1 (Lower Leg) Wound Laterality: Left, Lateral Cleanser Wound Cleanser Discharge Instruction: Cleanse the wound with wound cleanser prior to applying a clean dressing using gauze sponges, not tissue or cotton balls. Peri-Wound Care Skin Prep Discharge Instruction: Use skin prep as directed Sween Lotion (Moisturizing lotion) Discharge Instruction: Apply moisturizing lotion to left leg with dressing. Topical Primary Dressing Poly Mem Silver Rope Discharge Instruction: Lightly pack into tunnel at 12. Secondary Dressing Zetuvit Plus Silicone Border Dressing 5x5 (in/in) Discharge Instruction: Apply silicone  border over primary dressing as directed. Secured With Compression Wrap Compression Stockings Environmental education officer) Signed: 03/10/2022 4:23:48 PM By: Kalman Shan DO Signed: 03/10/2022 5:46:02 PM By: Deon Pilling RN, BSN Entered By: Kalman Shan on 03/10/2022 16:05:54 -------------------------------------------------------------------------------- Multi-Disciplinary Care Plan Details Patient Name: Date of Service: [name] 03/10/2022 2:15 PM Medical Record Number: [id number] Patient Account Number: [id number] Date of Birth/Sex: Treating RN: Sep 23, 1950 (71 y.o. [name], Meta.Reding Primary Care Nancey Kreitz: Deland Pretty Other Clinician: Referring Ardis Fullwood: Treating Rim Thatch/Extender: Judie Grieve in Treatment: 15 Active Inactive Abuse / Safety / Falls / Self Care Management Nursing Diagnoses: History of Falls Potential for falls Goals: Patient/caregiver will verbalize/demonstrate measure taken to improve self care Date Initiated: 11/19/2021 Target Resolution Date: 04/21/2022 Goal Status: Active Patient/caregiver will verbalize/demonstrate measures taken to prevent injury and/or falls Date Initiated: 11/19/2021 Target Resolution Date: 03/31/2022 Goal Status: Active Interventions: Provide education on basic hygiene Provide education on fall prevention Provide education on HBO safety Provide education on vaccinations Notes: Pain, Acute or Chronic Nursing Diagnoses: Pain, acute or chronic: actual or potential Potential alteration in comfort, pain Goals: Patient will verbalize adequate pain control and receive pain control interventions during procedures as needed Date Initiated: 11/19/2021 Target Resolution Date: 04/15/2022 Goal Status: Active Patient/caregiver will verbalize comfort level met Date Initiated: 11/19/2021 Target Resolution Date: 04/22/2022 Goal Status: Active Interventions: Encourage patient to take pain  medications as prescribed Provide education on pain management Reposition patient for comfort Treatment Activities: Administer pain control measures as ordered : 11/19/2021 Notes: Electronic Signature(s) Signed: 03/10/2022 5:46:02 PM By: Deon Pilling RN, BSN Entered By: Deon Pilling on 03/10/2022 14:32:41 -------------------------------------------------------------------------------- Pain Assessment Details Patient Name: Date of Service: [name] 03/10/2022 2:15 PM Medical Record Number: [id number] Patient Account Number: [id number] Date of Birth/Sex: Treating RN: 07-21-1951 (71 y.o. [name] Primary Care Erich Kochan: Deland Pretty Other Clinician: Referring Haydin Dunn: Treating Murdis Flitton/Extender: Judie Grieve in Treatment: 15 Active Problems Location of Pain Severity and Description of Pain Patient Has Paino No Site Locations Rate the pain. Current Pain Level: 0 Pain Management and Medication Current Pain Management: Medication: No Cold Application: No Rest: No Massage: No Activity: No T.E.N.S.: No Heat Application: No Leg drop or elevation:  No Is the Current Pain Management Adequate: Adequate How does your wound impact your activities of daily livingo Sleep: No Bathing: No Appetite: No Relationship With Others: No Bladder Continence: No Emotions: No Bowel Continence: No Work: No Toileting: No Drive: No Dressing: No Hobbies: No Engineer, maintenance) Signed: 03/10/2022 5:46:02 PM By: Deon Pilling RN, BSN Entered By: Deon Pilling on 03/10/2022 14:25:57 -------------------------------------------------------------------------------- Patient/Caregiver Education Details Patient Name: Date of Service: [name] 7/20/2023andnbsp2:15 PM Medical Record Number: [id number] Patient Account Number: [id number] Date of Birth/Gender: Treating RN: [birth year] (71 y.o. [name] Primary Care Physician: Deland Pretty Other Clinician: Referring Physician: Treating Physician/Extender: Judie Grieve in Treatment: 15 Education Assessment Education Provided To: Patient Education Topics Provided Wound/Skin Impairment: Handouts: Skin Care Do's and Dont's Methods: Explain/Verbal Responses: Reinforcements needed Electronic Signature(s) Signed: 03/10/2022 5:46:02 PM By: Deon Pilling RN, BSN Entered By: Deon Pilling on 03/10/2022 14:32:57 -------------------------------------------------------------------------------- Wound Assessment Details Patient Name: Date of Service: [name] 03/10/2022 2:15 PM Medical Record Number: [id number] Patient Account Number: [id number] Date of Birth/Sex: Treating RN: May 08, 1951 (71 y.o. [name], Meta.Reding Primary Care Theadora Noyes: Deland Pretty Other Clinician: Referring Aayat Hajjar: Treating Hektor Huston/Extender: Judie Grieve in Treatment: 15 Wound Status Wound Number: 1 Primary Etiology: Trauma, Other Wound Location: Left, Lateral Lower Leg Wound Status: Open Wounding Event: Trauma Comorbid History: Congestive Heart Failure, Hypertension Date Acquired: 10/18/2021 Weeks Of Treatment: 15 Clustered Wound: Yes Photos Wound Measurements Length: (cm) 1 Width: (cm) 2.7 Depth: (cm) 2 Clustered Quantity: 1 Area: (cm) 2.121 Volume: (cm) 4.241 % Reduction in Area: 98.9% % Reduction in Volume: 99.7% Epithelialization: Small (1-33%) Tunneling: Yes Position (o'clock): 12 Maximum Distance: (cm) 4.2 Undermining: No Wound Description Classification: Full Thickness Without Exposed Support Structures Wound Margin: Distinct, outline attached Exudate Amount: Medium Exudate Type: Serosanguineous Exudate Color: red, brown Foul Odor After Cleansing: No Slough/Fibrino No Wound Bed Granulation Amount: Large (67-100%) Exposed Structure Granulation Quality: Red Fascia Exposed: No Necrotic Amount: None Present  (0%) Fat Layer (Subcutaneous Tissue) Exposed: Yes Tendon Exposed: No Muscle Exposed: No Joint Exposed: No Bone Exposed: No Treatment Notes Wound #1 (Lower Leg) Wound Laterality: Left, Lateral Cleanser Wound Cleanser Discharge Instruction: Cleanse the wound with wound cleanser prior to applying a clean dressing using gauze sponges, not tissue or cotton balls. Peri-Wound Care Skin Prep Discharge Instruction: Use skin prep as directed Sween Lotion (Moisturizing lotion) Discharge Instruction: Apply moisturizing lotion to left leg with dressing. Topical Primary Dressing Poly Mem Silver Rope Discharge Instruction: Lightly pack into tunnel at 12. Secondary Dressing Zetuvit Plus Silicone Border Dressing 5x5 (in/in) Discharge Instruction: Apply silicone border over primary dressing as directed. Secured With Compression Wrap Compression Stockings Environmental education officer) Signed: 03/10/2022 4:28:42 PM By: Rhae Hammock RN Signed: 03/10/2022 5:46:02 PM By: Deon Pilling RN, BSN Entered By: Rhae Hammock on 03/10/2022 14:31:22 -------------------------------------------------------------------------------- Vitals Details Patient Name: Date of Service: [name]. 03/10/2022 2:15 PM Medical Record Number: [id number] Patient Account Number: [id number] Date of Birth/Sex: Treating RN: 1951/04/30 (71 y.o. [name], Meta.Reding Primary Care Tiran Sauseda: Deland Pretty Other Clinician: Referring Ivory Maduro: Treating Tennille Montelongo/Extender: Judie Grieve in Treatment: 15 Vital Signs Time Taken: 14:20 Temperature (F): 98.8 Height (in): 67 Pulse (bpm): 60 Weight (lbs): 340 Respiratory Rate (breaths/min): 20 Body Mass Index (BMI): 53.2 Blood Pressure (mmHg): 179/77 Reference Range: 80 - 120 mg / dl Electronic Signature(s) Signed: 03/10/2022 5:46:02 PM By: Deon Pilling RN, BSN Entered By: Deon Pilling on  03/10/2022 14:25:50

## 2022-03-10 NOTE — Progress Notes (Signed)
RASHENA, SKOK ([id number]) Visit Report for 03/10/2022 Chief Complaint Document Details Patient Name: Date of Service: [name] 03/10/2022 2:15 PM Medical Record Number: [id number] Patient Account Number: [id number] Date of Birth/Sex: Treating RN: 30-Nov-1950 (71 y.o. [name] Primary Care Provider: Deland Pretty Other Clinician: Referring Provider: Treating Provider/Extender: Judie Grieve in Treatment: 15 Information Obtained from: Patient Chief Complaint 11/19/2021; Left lower extremity wound status post fall Electronic Signature(s) Signed: 03/10/2022 4:23:48 PM By: Kalman Shan DO Entered By: Kalman Shan on 03/10/2022 16:06:03 -------------------------------------------------------------------------------- HPI Details Patient Name: Date of Service: [name]. 03/10/2022 2:15 PM Medical Record Number: [id number] Patient Account Number: [id number] Date of Birth/Sex: Treating RN: 03-19-1951 (71 y.o. [name] Primary Care Provider: Deland Pretty Other Clinician: Referring Provider: Treating Provider/Extender: Judie Grieve in Treatment: 15 History of Present Illness HPI Description: Admission 11/19/2021 [name] is a 71 year old female with a past medical history of paroxysmal A-fib on Eliquis, hypothyroidism, major depressive disorder, venous insufficiency and chronic diastolic heart failure that presents to the clinic for a 1 month history of nonhealing wound to the left lower extremity. She visited the ED on 10/18/2021 after a mechanical fall. She developed a hematoma that subsequently opened. She was hospitalized for 7 days and discharged on 10/25/2021. She has been on several different antibiotics For the past month. She states that most recently she was on Levaquin and linezolid for the past week. She states she completes her antibiotic course tomorrow. She has been using  Dakin's wet-to-dry dressings to the wound bed. She denies signs of infection. 4/7; patient presents for follow-up. She has been using Dakin's wet-to-dry dressings. She did end up going to the ED on 4/1 because she had excess bleeding with dressing change that she could not stop. She is on Eliquis for A-fib. In the ED they tied off a small artery. She has had no issues since discharge. She denies signs of infection. 4/14; this is a very difficult clinical situation. A patient with underlying chronic venous insufficiency and lymphedema very significant lower extremity edema had a hematoma after a fall on her left upper lateral lower leg. She is on Eliquis for atrial fibrillation apparently with a history of a splenic infarct following with Dr. Jolyn Nap of cardiology. She has exhibited significant bleeding from the wound surface including ao Venous bleeder that required suturing short while ago. She has been using Dakin's wet-to-dry packing and over the surface of the wound. She saw Dr. Caryl Comes yesterday he is reluctant to consider stopping the Eliquis because of the prior history of presumed cardioembolism. Wants to communicate with Dr. Heber [place] when she returns. In a perfect world where she was not on Eliquis she requires a wound VAC with additional compression wraps but I understand the reluctance to do this because of the concerns of bleeding 4/28; the patient's wound actually looks better today using Dakin's wet-to-dry that she is changing twice a day she is wrapping this with Kerlix and Ace wrapping. She tells me she had 2 small bleeding areas which were part of the superficial wound that stopped this week with direct pressure. Other than that no major issues. In follow-up from discussion of last week Dr. 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 The bigger question  from my point of view is the wound VAC issue. As far as she knows and her daughter-in-law verifies that she has not had any bleeding from the deeper parts of the wound although the bleeding has been superficial including the one that sent her to the ER for stitches. She is concerned that a wound VAC would cause further bleeding and I cannot completely allay those concerns. 5/8; patient presents for follow-up. She has no issues or complaints today. She has been using Dakin's wet-to-dry dressings without issues. She denies signs of infection. 5/12; patient presents for follow-up. She continues to use Dakin's wet-to-dry dressings without issues. She denies signs of infection. She reports some issues with bleeding at times but this has improved. She denies signs of infection. 6/1; patient presents for follow-up. She was recently hospitalized for upper left leg thigh cellulitis. She was given IV cefepime and vancomycin and discharged on oral antibiotics. She has been using Dakin's wet-to-dry dressings to the left lower leg wound. She reports improvement in healing. She currently denies systemic signs of infection. 6/7; patient is using Dakin's wet-to-dry twice daily. In general this looks better than when I saw this a month or so ago however still considerable depth to the tunnel in her left leg. She is going for iron infusions ordered by her primary care doctor I believe 6/15; patient presents for follow-up. She has been using Dakin's wet-to-dry dressings. She has noted some scattered small areas that blister up and heal on her left lower leg. She has been using mupirocin ointment on them. Nothing open today. 6/23; patient's been using Dakin's wet-to-dry dressings to the tunneled wound and Hydrofera Blue to the opening. She has no issues or complaints today. 6/29; patient presents for follow-up. She has been using Dakin's wet-to-dry dressings to the tunneled wound  and Hydrofera Blue to the opening. She states that [facility] is sticking to the wound bed. She denies signs of infection. 7/7; patient presents for follow-up. She has been using Dakin's wet-to-dry dressings to the tunneled wound and PolyMem silver to the opening without issues. 7/13; patient presents for follow-up. She has been using PolyMem silver to the opening and the rope to the tunnel. She has no issues or complaints today. She denies signs of infection. 7/20; patient presents for follow-up. We have been using PolyMem silver to the wound bed. She has no issues or complaints today. She is receiving her custom compression garments tomorrow. Electronic Signature(s) Signed: 03/10/2022 4:23:48 PM By: Kalman Shan DO Entered By: Kalman Shan on 03/10/2022 16:06:36 -------------------------------------------------------------------------------- Physical Exam Details Patient Name: Date of Service: [name] 03/10/2022 2:15 PM Medical Record Number: [id number] Patient Account Number: [id number] Date of Birth/Sex: Treating RN: [birth year] (71 y.o. [name] Primary Care Provider: Deland Pretty Other Clinician: Referring Provider: Treating Provider/Extender: Judie Grieve in Treatment: 15 Constitutional respirations regular, non-labored and within target range for patient.. Cardiovascular 2+ dorsalis pedis/posterior tibialis pulses. Psychiatric pleasant and cooperative. Notes Left lower extremity: Open wound with granulation tissue to the opening and increased depth and tunneling within. No signs of surrounding infection. Electronic Signature(s) Signed: 03/10/2022 4:23:48 PM By: Kalman Shan DO Entered By: Kalman Shan on 03/10/2022 16:07:04 -------------------------------------------------------------------------------- Physician Orders Details Patient Name: Date of Service: [name] 03/10/2022 2:15 PM Medical  Record Number: [id number] Patient Account Number: [id number] Date of Birth/Sex: Treating RN: 1950/09/12 (71 y.o. [name] Primary Care Provider: Deland Pretty Other Clinician: Referring Provider: Treating Provider/Extender: Heber [place]  Edmonia Caprio, Charlean Sanfilippo in Treatment: 15 Verbal / Phone Orders: No Diagnosis Coding ICD-10 Coding Code Description [phone] Non-pressure chronic ulcer of other part of left lower leg with fat layer exposed T79.8XXA Other early complications of trauma, initial encounter I48.0 Paroxysmal atrial fibrillation Z79.01 Long term (current) use of anticoagulants Follow-up Appointments ppointment in 1 week. - Dr. Heber [place] and [place], Room 8 03/17/2022 3pm Thursday Return A ppointment in 2 weeks. - Dr. Heber [place] and [place], Room 8 03/24/2022 215pm Thursday Return A Other: - Continue to wear compression stockings daily. Bathing/ Shower/ Hygiene May shower with protection but do not get wound dressing(s) wet. Edema Control - Lymphedema / SCD / Other Elevate legs to the level of the heart or above for 30 minutes daily and/or when sitting, a frequency of: - 3-4 times a day throughout the day. Avoid standing for long periods of time. Moisturize legs daily. - both legs every night before bed. Compression stocking or Garment 20-30 mm/Hg pressure to: - patient wear in the morning and remove at night. no ace wrap when wearing. Home Health No change in wound care orders this week; continue Home Health for wound care. May utilize formulary equivalent dressing for wound treatment orders unless otherwise specified. - change every other day dressings. Other Home Health Orders/Instructions: - [facility] Wound Treatment Wound #1 - Lower Leg Wound Laterality: Left, Lateral Cleanser: Wound Cleanser Every Other Day/30 Days Discharge Instructions: Cleanse the wound with wound cleanser prior to applying a clean dressing using gauze sponges, not tissue or cotton  balls. Peri-Wound Care: Skin Prep Every Other Day/30 Days Discharge Instructions: Use skin prep as directed Peri-Wound Care: Sween Lotion (Moisturizing lotion) Every Other Day/30 Days Discharge Instructions: Apply moisturizing lotion to left leg with dressing. Prim Dressing: Poly Mem Silver Rope Every Other Day/30 Days ary Discharge Instructions: Lightly pack into tunnel at 12. Secondary Dressing: Zetuvit Plus Silicone Border Dressing 5x5 (in/in) Every Other Day/30 Days Discharge Instructions: Apply silicone border over primary dressing as directed. Electronic Signature(s) Signed: 03/10/2022 4:23:48 PM By: Kalman Shan DO Entered By: Kalman Shan on 03/10/2022 16:07:12 -------------------------------------------------------------------------------- Problem List Details Patient Name: Date of Service: [name] 03/10/2022 2:15 PM Medical Record Number: [id number] Patient Account Number: [id number] Date of Birth/Sex: Treating RN: [birth year] (71 y.o. 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 03/10/2022 2:15 PM Medical Record Number:  [id number] Patient Account Number: [id number] Date of Birth/Sex: Treating RN: [birth year] (71 y.o. [name] Primary Care Provider: Deland Pretty Other Clinician: Referring Provider: Treating Provider/Extender: Judie Grieve in Treatment: 15 Subjective Chief Complaint Information obtained from Patient 11/19/2021; Left lower extremity wound status post fall History of Present Illness (HPI) Admission 11/19/2021 Ms. Torria Grealish is a 71 year old female with a past medical history of paroxysmal A-fib on Eliquis, hypothyroidism, major depressive disorder, venous insufficiency and chronic diastolic heart failure that presents to  the clinic for a 1 month history of nonhealing wound to the left lower extremity. She visited the ED on 10/18/2021 after a mechanical fall. She developed a hematoma that subsequently opened. She was hospitalized for 7 days and discharged on 10/25/2021. She has been on several different antibiotics For the past month. She states that most recently she was on Levaquin and linezolid for the past week. She states she completes her antibiotic course tomorrow. She has been using Dakin's wet-to-dry dressings to the wound bed. She denies signs of infection. 4/7; patient presents for follow-up. She has been using Dakin's wet-to-dry dressings. She did end up going to the ED on 4/1 because she had excess bleeding with dressing change that she could not stop. She is on Eliquis for A-fib. In the ED they tied off a small artery. She has had no issues since discharge. She denies signs of infection. 4/14; this is a very difficult clinical situation. A patient with underlying chronic venous insufficiency and lymphedema very significant lower extremity edema had a hematoma after a fall on her left upper lateral lower leg. She is on Eliquis for atrial fibrillation apparently with a history of a splenic infarct following with Dr. Berton Mount of cardiology. She has  exhibited significant bleeding from the wound surface including ao Venous bleeder that required suturing short while ago. She has been using Dakin's wet-to-dry packing and over the surface of the wound. She saw Dr. Graciela Husbands yesterday he is reluctant to consider stopping the Eliquis because of the prior history of presumed cardioembolism. Wants to communicate with Dr. Mikey Bussing when she returns. In a perfect world where she was not on Eliquis she requires a wound VAC with additional compression wraps but I understand the reluctance to do this because of the concerns of bleeding 4/28; the patient's wound actually looks better today using Dakin's wet-to-dry that she is changing twice a day she is wrapping this with Kerlix and Ace wrapping. She tells me she had 2 small bleeding areas which were part of the superficial wound that stopped this week with direct pressure. Other than that no major issues. In follow-up from discussion of last week Dr. 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 The bigger question from my point of view is the wound VAC issue. As far as she knows and her daughter-in-law verifies that she has not had any bleeding from the deeper parts of the wound although the bleeding has been superficial including the one that sent her to the ER for stitches. She is concerned that a wound VAC would cause further bleeding and I cannot completely allay those concerns. 5/8; patient presents for follow-up. She has no issues or complaints today. She has been using Dakin's wet-to-dry dressings without issues. She denies signs of infection. 5/12; patient presents for follow-up. She continues to use Dakin's wet-to-dry dressings without issues. She denies signs of infection. She reports some issues with bleeding at times but this has improved.  She denies signs of infection. 6/1; patient presents for follow-up. She was recently hospitalized for upper left leg thigh cellulitis. She was given IV cefepime and vancomycin and discharged on oral antibiotics. She has been using Dakin's wet-to-dry dressings to the left lower leg wound. She reports improvement in healing. She currently denies systemic signs of infection. 6/7; patient is using Dakin's  wet-to-dry twice daily. In general this looks better than when I saw this a month or so ago however still considerable depth to the tunnel in her left leg. She is going for iron infusions ordered by her primary care doctor I believe 6/15; patient presents for follow-up. She has been using Dakin's wet-to-dry dressings. She has noted some scattered small areas that blister up and heal on her left lower leg. She has been using mupirocin ointment on them. Nothing open today. 6/23; patient's been using Dakin's wet-to-dry dressings to the tunneled wound and Hydrofera Blue to the opening. She has no issues or complaints today. 6/29; patient presents for follow-up. She has been using Dakin's wet-to-dry dressings to the tunneled wound and Hydrofera Blue to the opening. She states that [facility] is sticking to the wound bed. She denies signs of infection. 7/7; patient presents for follow-up. She has been using Dakin's wet-to-dry dressings to the tunneled wound and PolyMem silver to the opening without issues. 7/13; patient presents for follow-up. She has been using PolyMem silver to the opening and the rope to the tunnel. She has no issues or complaints today. She denies signs of infection. 7/20; patient presents for follow-up. We have been using PolyMem silver to the wound bed. She has no issues or complaints today. She is receiving her custom compression garments tomorrow. Patient History Medical History Cardiovascular Patient has history of Congestive Heart Failure,  Hypertension Hospitalization/Surgery History - Cellulitis left leg- 01/03/2022-01/07/2022. Medical A Surgical History Notes nd Constitutional Symptoms (General Health) Infarction of spleen Hematologic/Lymphatic Hypothyroidism Cardiovascular A-Fib Gastrointestinal Gastroesophageal reflux Genitourinary Chronic kidney disease Musculoskeletal Osteoarthritis of knee Psychiatric Anxiety Objective Constitutional respirations regular, non-labored and within target range for patient.. Vitals Time Taken: 2:20 PM, Height: 67 in, Weight: 340 lbs, BMI: 53.2, Temperature: 98.8 F, Pulse: 60 bpm, Respiratory Rate: 20 breaths/min, Blood Pressure: 179/77 mmHg. Cardiovascular 2+ dorsalis pedis/posterior tibialis pulses. Psychiatric pleasant and cooperative. General Notes: Left lower extremity: Open wound with granulation tissue to the opening and increased depth and tunneling within. No signs of surrounding infection. Integumentary (Hair, Skin) Wound #1 status is Open. Original cause of wound was Trauma. The date acquired was: 10/18/2021. The wound has been in treatment 15 weeks. The wound is located on the Left,Lateral Lower Leg. The wound measures 1cm length x 2.7cm width x 2cm depth; 2.121cm^2 area and 4.241cm^3 volume. There is Fat Layer (Subcutaneous Tissue) exposed. There is no undermining noted, however, there is tunneling at 12:00 with a maximum distance of 4.2cm. There is a medium amount of serosanguineous drainage noted. The wound margin is distinct with the outline attached to the wound base. There is large (67-100%) red granulation within the wound bed. There is no necrotic tissue within the wound bed. Assessment Active Problems ICD-10 Non-pressure chronic ulcer of other part of left lower leg with fat layer exposed Other early complications of trauma, initial encounter Paroxysmal atrial fibrillation Long term (current) use of anticoagulants Patient's wound has shown improvement in  size and appearance since last clinic visit. I recommended continue PolyMem silver. No signs of surrounding infection. Follow-up in 1 week. Plan Follow-up Appointments: Return Appointment in 1 week. - Dr. Heber [place] and [place], Room 8 03/17/2022 3pm Thursday Return Appointment in 2 weeks. - Dr. Heber [place] and [place], Room 8 03/24/2022 215pm Thursday Other: - Continue to wear compression stockings daily. Bathing/ Shower/ Hygiene: May shower with protection but do not get wound dressing(s) wet. Edema Control - Lymphedema / SCD / Other: Elevate legs to the  level of the heart or above for 30 minutes daily and/or when sitting, a frequency of: - 3-4 times a day throughout the day. Avoid standing for long periods of time. Moisturize legs daily. - both legs every night before bed. Compression stocking or Garment 20-30 mm/Hg pressure to: - patient wear in the morning and remove at night. no ace wrap when wearing. Home Health: No change in wound care orders this week; continue Home Health for wound care. May utilize formulary equivalent dressing for wound treatment orders unless otherwise specified. - change every other day dressings. Other Home Health Orders/Instructions: - [facility] WOUND #1: - Lower Leg Wound Laterality: Left, Lateral Cleanser: Wound Cleanser Every Other Day/30 Days Discharge Instructions: Cleanse the wound with wound cleanser prior to applying a clean dressing using gauze sponges, not tissue or cotton balls. Peri-Wound Care: Skin Prep Every Other Day/30 Days Discharge Instructions: Use skin prep as directed Peri-Wound Care: Sween Lotion (Moisturizing lotion) Every Other Day/30 Days Discharge Instructions: Apply moisturizing lotion to left leg with dressing. Prim Dressing: Poly Mem Silver Rope Every Other Day/30 Days ary Discharge Instructions: Lightly pack into tunnel at 12. Secondary Dressing: Zetuvit Plus Silicone Border Dressing 5x5 (in/in) Every Other Day/30 Days Discharge  Instructions: Apply silicone border over primary dressing as directed. 1. PolyMem silver 2. Follow-up in 1 week Electronic Signature(s) Signed: 03/10/2022 4:23:48 PM By: Kalman Shan DO Entered By: Kalman Shan on 03/10/2022 16:08:21 -------------------------------------------------------------------------------- HxROS Details Patient Name: Date of Service: [name] 03/10/2022 2:15 PM Medical Record Number: [id number] Patient Account Number: [id number] Date of Birth/Sex: Treating RN: 08/19/1951 (71 y.o. [name] Primary Care Provider: Deland Pretty Other Clinician: Referring Provider: Treating Provider/Extender: Judie Grieve in Treatment: 15 Constitutional Symptoms (General Health) Medical History: Past Medical History Notes: Infarction of spleen Hematologic/Lymphatic Medical History: Past Medical History Notes: Hypothyroidism Cardiovascular Medical History: Positive for: Congestive Heart Failure; Hypertension Past Medical History Notes: A-Fib Gastrointestinal Medical History: Past Medical History Notes: Gastroesophageal reflux Genitourinary Medical History: Past Medical History Notes: Chronic kidney disease Musculoskeletal Medical History: Past Medical History Notes: Osteoarthritis of knee Psychiatric Medical History: Past Medical History Notes: Anxiety Immunizations Pneumococcal Vaccine: Received Pneumococcal Vaccination: No Implantable Devices No devices added Hospitalization / Surgery History Type of Hospitalization/Surgery Cellulitis left leg- 01/03/2022-01/07/2022 Electronic Signature(s) Signed: 03/10/2022 4:23:48 PM By: Kalman Shan DO Signed: 03/10/2022 5:46:02 PM By: Deon Pilling RN, BSN Entered By: Kalman Shan on 03/10/2022 16:06:43 -------------------------------------------------------------------------------- [place] Details Patient Name: Date of Service: [name]  03/10/2022 Medical Record Number: [id number] Patient Account Number: [id number] Date of Birth/Sex: Treating RN: [birth year] (71 y.o. [name] Primary Care Provider: Deland Pretty Other Clinician: Referring Provider: Treating Provider/Extender: Judie Grieve in Treatment: 15 Diagnosis Coding ICD-10 Codes Code Description [phone] Non-pressure chronic ulcer of other part of left lower leg with fat layer exposed T79.8XXA Other early complications of trauma, initial encounter I48.0 Paroxysmal atrial fibrillation Z79.01 Long term (current) use of anticoagulants Facility Procedures CPT4 Code: [id number] Description: 99213 - WOUND CARE VISIT-LEV 3 EST PT Modifier: Quantity: 1 Physician Procedures : CPT4 Code Description Modifier [id number] - WC PHYS LEVEL 3 - EST PT ICD-10 Diagnosis Description L97.822 Non-pressure chronic ulcer of other part of left lower leg with fat layer exposed T79.8XXA Other early complications of trauma, initial  encounter I48.0 Paroxysmal atrial fibrillation Z79.01 Long term (current) use of anticoagulants Quantity: 1 Electronic Signature(s) Signed: 03/10/2022 4:23:48 PM By: Kalman Shan DO Entered By:  Geralyn Corwin on 03/10/2022 16:08:46

## 2022-03-11 DIAGNOSIS — L97822 Non-pressure chronic ulcer of other part of left lower leg with fat layer exposed: Secondary | ICD-10-CM | POA: Diagnosis not present

## 2022-03-14 DIAGNOSIS — F325 Major depressive disorder, single episode, in full remission: Secondary | ICD-10-CM | POA: Diagnosis not present

## 2022-03-14 DIAGNOSIS — L03116 Cellulitis of left lower limb: Secondary | ICD-10-CM | POA: Diagnosis not present

## 2022-03-14 DIAGNOSIS — T24332D Burn of third degree of left lower leg, subsequent encounter: Secondary | ICD-10-CM | POA: Diagnosis not present

## 2022-03-14 DIAGNOSIS — I110 Hypertensive heart disease with heart failure: Secondary | ICD-10-CM | POA: Diagnosis not present

## 2022-03-14 DIAGNOSIS — D500 Iron deficiency anemia secondary to blood loss (chronic): Secondary | ICD-10-CM | POA: Diagnosis not present

## 2022-03-14 DIAGNOSIS — E039 Hypothyroidism, unspecified: Secondary | ICD-10-CM | POA: Diagnosis not present

## 2022-03-14 DIAGNOSIS — I5032 Chronic diastolic (congestive) heart failure: Secondary | ICD-10-CM | POA: Diagnosis not present

## 2022-03-14 DIAGNOSIS — I4891 Unspecified atrial fibrillation: Secondary | ICD-10-CM | POA: Diagnosis not present

## 2022-03-14 DIAGNOSIS — Z7901 Long term (current) use of anticoagulants: Secondary | ICD-10-CM | POA: Diagnosis not present

## 2022-03-14 DIAGNOSIS — J309 Allergic rhinitis, unspecified: Secondary | ICD-10-CM | POA: Diagnosis not present

## 2022-03-14 DIAGNOSIS — Z96653 Presence of artificial knee joint, bilateral: Secondary | ICD-10-CM | POA: Diagnosis not present

## 2022-03-14 DIAGNOSIS — E871 Hypo-osmolality and hyponatremia: Secondary | ICD-10-CM | POA: Diagnosis not present

## 2022-03-14 DIAGNOSIS — Z9049 Acquired absence of other specified parts of digestive tract: Secondary | ICD-10-CM | POA: Diagnosis not present

## 2022-03-14 DIAGNOSIS — Z9181 History of falling: Secondary | ICD-10-CM | POA: Diagnosis not present

## 2022-03-14 DIAGNOSIS — Z6841 Body Mass Index (BMI) 40.0 and over, adult: Secondary | ICD-10-CM | POA: Diagnosis not present

## 2022-03-17 ENCOUNTER — Encounter (HOSPITAL_BASED_OUTPATIENT_CLINIC_OR_DEPARTMENT_OTHER): Payer: PPO | Admitting: Internal Medicine

## 2022-03-17 DIAGNOSIS — I5032 Chronic diastolic (congestive) heart failure: Secondary | ICD-10-CM | POA: Diagnosis not present

## 2022-03-17 DIAGNOSIS — D500 Iron deficiency anemia secondary to blood loss (chronic): Secondary | ICD-10-CM | POA: Diagnosis not present

## 2022-03-17 DIAGNOSIS — Z7901 Long term (current) use of anticoagulants: Secondary | ICD-10-CM

## 2022-03-17 DIAGNOSIS — I110 Hypertensive heart disease with heart failure: Secondary | ICD-10-CM | POA: Diagnosis not present

## 2022-03-17 DIAGNOSIS — I480 Paroxysmal atrial fibrillation: Secondary | ICD-10-CM

## 2022-03-17 DIAGNOSIS — L97822 Non-pressure chronic ulcer of other part of left lower leg with fat layer exposed: Secondary | ICD-10-CM | POA: Diagnosis not present

## 2022-03-17 DIAGNOSIS — T798XXA Other early complications of trauma, initial encounter: Secondary | ICD-10-CM | POA: Diagnosis not present

## 2022-03-17 DIAGNOSIS — E039 Hypothyroidism, unspecified: Secondary | ICD-10-CM | POA: Diagnosis not present

## 2022-03-17 DIAGNOSIS — L03116 Cellulitis of left lower limb: Secondary | ICD-10-CM | POA: Diagnosis not present

## 2022-03-17 DIAGNOSIS — I4891 Unspecified atrial fibrillation: Secondary | ICD-10-CM | POA: Diagnosis not present

## 2022-03-17 NOTE — Progress Notes (Signed)
SAFA, DERNER ([id number]) Visit Report for 03/17/2022 Arrival Information Details Patient Name: Date of Service: [name] 03/17/2022 3:00 PM Medical Record Number: [id number] Patient Account Number: [id number] Date of Birth/Sex: Treating RN: 09-21-1950 (71 y.o. [name], Meta.Reding Primary Care Judit Awad: Deland Pretty Other Clinician: Referring Chee Kinslow: Treating Wildon Cuevas/Extender: Judie Grieve in Treatment: 16 Visit Information History Since Last Visit Added or deleted any medications: No Patient Arrived: [name] Any new allergies or adverse reactions: No Arrival Time: 15:02 Had a fall or experienced change in No Accompanied By: son in law activities of daily living that may affect Transfer Assistance: None risk of falls: Patient Identification Verified: Yes Signs or symptoms of abuse/neglect since last visito No Secondary Verification Process Completed: Yes Hospitalized since last visit: No Patient Requires Transmission-Based Precautions: No Implantable device outside of the clinic excluding No Patient Has Alerts: Yes cellular tissue based products placed in the center Patient Alerts: Patient on Blood Thinner since last visit: Has Dressing in Place as Prescribed: Yes Pain Present Now: No Electronic Signature(s) Signed: 03/17/2022 3:54:56 PM By: Erenest Blank Entered By: Erenest Blank on 03/17/2022 15:03:23 -------------------------------------------------------------------------------- Clinic Level of Care Assessment Details Patient Name: Date of Service: [name] 03/17/2022 3:00 PM Medical Record Number: [id number] Patient Account Number: [id number] Date of Birth/Sex: Treating RN: [birth year] (72 y.o. [name], Meta.Reding Primary Care Candia Kingsbury: Deland Pretty Other Clinician: Referring Arliene Rosenow: Treating Lorijean Husser/Extender: Judie Grieve in Treatment: 16 Clinic Level of Care Assessment Items TOOL 4  Quantity Score X- 1 0 Use when only an EandM is performed on FOLLOW-UP visit ASSESSMENTS - Nursing Assessment / Reassessment X- 1 10 Reassessment of Co-morbidities (includes updates in patient status) X- 1 5 Reassessment of Adherence to Treatment Plan ASSESSMENTS - Wound and Skin A ssessment / Reassessment X - Simple Wound Assessment / Reassessment - one wound 1 5 []  - 0 Complex Wound Assessment / Reassessment - multiple wounds X- 1 10 Dermatologic / Skin Assessment (not related to wound area) ASSESSMENTS - Focused Assessment X- 1 5 Circumferential Edema Measurements - multi extremities []  - 0 Nutritional Assessment / Counseling / Intervention []  - 0 Lower Extremity Assessment (monofilament, tuning fork, pulses) []  - 0 Peripheral Arterial Disease Assessment (using hand held doppler) ASSESSMENTS - Ostomy and/or Continence Assessment and Care []  - 0 Incontinence Assessment and Management []  - 0 Ostomy Care Assessment and Management (repouching, etc.) PROCESS - Coordination of Care X - Simple Patient / Family Education for ongoing care 1 15 []  - 0 Complex (extensive) Patient / Family Education for ongoing care X- 1 10 Staff obtains Programmer, systems, Records, T Results / Process Orders est X- 1 10 Staff telephones HHA, Nursing Homes / Clarify orders / etc []  - 0 Routine Transfer to another Facility (non-emergent condition) []  - 0 Routine Hospital Admission (non-emergent condition) []  - 0 New Admissions / Biomedical engineer / Ordering NPWT Apligraf, etc. , []  - 0 Emergency Hospital Admission (emergent condition) X- 1 10 Simple Discharge Coordination []  - 0 Complex (extensive) Discharge Coordination PROCESS - Special Needs []  - 0 Pediatric / Minor Patient Management []  - 0 Isolation Patient Management []  - 0 Hearing / Language / Visual special needs []  - 0 Assessment of Community assistance (transportation, D/C planning, etc.) []  - 0 Additional assistance / Altered  mentation []  - 0 Support Surface(s) Assessment (bed, cushion, seat, etc.) INTERVENTIONS - Wound Cleansing / Measurement X - Simple Wound Cleansing - one wound 1 5 []  -  0 Complex Wound Cleansing - multiple wounds X- 1 5 Wound Imaging (photographs - any number of wounds) []  - 0 Wound Tracing (instead of photographs) X- 1 5 Simple Wound Measurement - one wound []  - 0 Complex Wound Measurement - multiple wounds INTERVENTIONS - Wound Dressings X - Small Wound Dressing one or multiple wounds 1 10 []  - 0 Medium Wound Dressing one or multiple wounds []  - 0 Large Wound Dressing one or multiple wounds []  - 0 Application of Medications - topical []  - 0 Application of Medications - injection INTERVENTIONS - Miscellaneous []  - 0 External ear exam []  - 0 Specimen Collection (cultures, biopsies, blood, body fluids, etc.) []  - 0 Specimen(s) / Culture(s) sent or taken to Lab for analysis []  - 0 Patient Transfer (multiple staff / Civil Service fast streamer / Similar devices) []  - 0 Simple Staple / Suture removal (25 or less) []  - 0 Complex Staple / Suture removal (26 or more) []  - 0 Hypo / Hyperglycemic Management (close monitor of Blood Glucose) []  - 0 Ankle / Brachial Index (ABI) - do not check if billed separately X- 1 5 Vital Signs Has the patient been seen at the hospital within the last three years: Yes Total Score: 110 Level Of Care: New/Established - Level 3 Electronic Signature(s) Signed: 03/17/2022 4:23:35 PM By: Deon Pilling RN, BSN Entered By: Deon Pilling on 03/17/2022 15:32:58 -------------------------------------------------------------------------------- Encounter Discharge Information Details Patient Name: Date of Service: Christine Debar [name] 03/17/2022 3:00 PM Medical Record Number: [id number] Patient Account Number: [id number] Date of Birth/Sex: Treating RN: Dec 24, 1950 (71 y.o. [name] Primary Care Keyleigh Manninen: Deland Pretty Other Clinician: Referring  Eulas Schweitzer: Treating Gennavieve Huq/Extender: Judie Grieve in Treatment: 16 Encounter Discharge Information Items Discharge Condition: Stable Ambulatory Status: Walker Discharge Destination: Home Transportation: Private Auto Accompanied By: self Schedule Follow-up Appointment: Yes Clinical Summary of Care: Electronic Signature(s) Signed: 03/17/2022 4:23:35 PM By: Deon Pilling RN, BSN Entered By: Deon Pilling on 03/17/2022 15:34:00 -------------------------------------------------------------------------------- Lower Extremity Assessment Details Patient Name: Date of Service: Christine Debar [name] 03/17/2022 3:00 PM Medical Record Number: [id number] Patient Account Number: [id number] Date of Birth/Sex: Treating RN: [birth year] (71 y.o. [name] Primary Care Miyu Fenderson: Deland Pretty Other Clinician: Referring Jasim Harari: Treating Nandana Krolikowski/Extender: Judie Grieve in Treatment: 16 Edema Assessment Assessed: Shirlyn Goltz: No] Patrice Paradise: No] Edema: [Left: Ye] [Right: s] Calf Left: Right: Point of Measurement: 31 cm From Medial Instep 48.1 cm Ankle Left: Right: Point of Measurement: 9 cm From Medial Instep 26.3 cm Electronic Signature(s) Signed: 03/17/2022 3:54:56 PM By: Erenest Blank Signed: 03/17/2022 4:23:35 PM By: Deon Pilling RN, BSN Entered By: Erenest Blank on 03/17/2022 15:15:12 -------------------------------------------------------------------------------- Multi Wound Chart Details Patient Name: Date of Service: Christine Debar [name] 03/17/2022 3:00 PM Medical Record Number: [id number] Patient Account Number: [id number] Date of Birth/Sex: Treating RN: 12-30-1950 (71 y.o. [name] Primary Care Garmon Dehn: Deland Pretty Other Clinician: Referring Alee Katen: Treating Hines Kloss/Extender: Judie Grieve in Treatment: 16 Vital Signs Height(in): 67 Pulse(bpm): 71 Weight(lbs): 340 Blood Pressure(mmHg):  142/78 Body Mass Index(BMI): 53.2 Temperature(F): 98.3 Respiratory Rate(breaths/min): 18 Photos: [N/A:N/A] Left, Lateral Lower Leg N/A N/A Wound Location: Trauma N/A N/A Wounding Event: Trauma, Other N/A N/A Primary Etiology: Congestive Heart Failure, N/A N/A Comorbid History: Hypertension 10/18/2021 N/A N/A Date Acquired: 16 N/A N/A Weeks of Treatment: Open N/A N/A Wound Status: No N/A N/A Wound Recurrence: Yes N/A N/A Clustered Wound: 1 N/A N/A Clustered Quantity: 0.8x1.2x1.6 N/A N/A Measurements L  x W x D (cm) 0.754 N/A N/A A (cm) : rea 1.206 N/A N/A Volume (cm) : 99.60% N/A N/A % Reduction in A rea: 99.90% N/A N/A % Reduction in Volume: 10 Position 1 (o'clock): 4.3 Maximum Distance 1 (cm): Yes N/A N/A Tunneling: Full Thickness Without Exposed N/A N/A Classification: Support Structures Medium N/A N/A Exudate Amount: Serosanguineous N/A N/A Exudate Type: red, brown N/A N/A Exudate Color: Distinct, outline attached N/A N/A Wound Margin: Large (67-100%) N/A N/A Granulation Amount: Red N/A N/A Granulation Quality: None Present (0%) N/A N/A Necrotic Amount: Fat Layer (Subcutaneous Tissue): Yes N/A N/A Exposed Structures: Fascia: No Tendon: No Muscle: No Joint: No Bone: No Small (1-33%) N/A N/A Epithelialization: Treatment Notes Wound #1 (Lower Leg) Wound Laterality: Left, Lateral Cleanser Wound Cleanser Discharge Instruction: Cleanse the wound with wound cleanser prior to applying a clean dressing using gauze sponges, not tissue or cotton balls. Peri-Wound Care Skin Prep Discharge Instruction: Use skin prep as directed Sween Lotion (Moisturizing lotion) Discharge Instruction: Apply moisturizing lotion to left leg with dressing. Topical Primary Dressing Poly Mem Silver Rope Discharge Instruction: Lightly pack into tunnel at 12. Secondary Dressing Zetuvit Plus Silicone Border Dressing 5x5 (in/in) Discharge Instruction: Apply silicone  border over primary dressing as directed. Secured With Compression Wrap Compression Stockings Environmental education officer) Signed: 03/17/2022 4:01:46 PM By: Kalman Shan DO Signed: 03/17/2022 4:23:35 PM By: Deon Pilling RN, BSN Entered By: Kalman Shan on 03/17/2022 15:52:24 -------------------------------------------------------------------------------- Multi-Disciplinary Care Plan Details Patient Name: Date of Service: Christine Debar [name] 03/17/2022 3:00 PM Medical Record Number: [id number] Patient Account Number: [id number] Date of Birth/Sex: Treating RN: 1951/04/07 (71 y.o. [name], Meta.Reding Primary Care Nesha Counihan: Deland Pretty Other Clinician: Referring Deniesha Stenglein: Treating Jullien Granquist/Extender: Judie Grieve in Treatment: 16 Active Inactive Abuse / Safety / Falls / Self Care Management Nursing Diagnoses: History of Falls Potential for falls Goals: Patient/caregiver will verbalize/demonstrate measure taken to improve self care Date Initiated: 11/19/2021 Target Resolution Date: 04/21/2022 Goal Status: Active Patient/caregiver will verbalize/demonstrate measures taken to prevent injury and/or falls Date Initiated: 11/19/2021 Target Resolution Date: 03/31/2022 Goal Status: Active Interventions: Provide education on basic hygiene Provide education on fall prevention Provide education on HBO safety Provide education on vaccinations Notes: Pain, Acute or Chronic Nursing Diagnoses: Pain, acute or chronic: actual or potential Potential alteration in comfort, pain Goals: Patient will verbalize adequate pain control and receive pain control interventions during procedures as needed Date Initiated: 11/19/2021 Target Resolution Date: 04/15/2022 Goal Status: Active Patient/caregiver will verbalize comfort level met Date Initiated: 11/19/2021 Target Resolution Date: 04/22/2022 Goal Status: Active Interventions: Encourage patient to take pain  medications as prescribed Provide education on pain management Reposition patient for comfort Treatment Activities: Administer pain control measures as ordered : 11/19/2021 Notes: Electronic Signature(s) Signed: 03/17/2022 4:23:35 PM By: Deon Pilling RN, BSN Entered By: Deon Pilling on 03/17/2022 15:28:01 -------------------------------------------------------------------------------- Pain Assessment Details Patient Name: Date of Service: Christine Debar [name] 03/17/2022 3:00 PM Medical Record Number: [id number] Patient Account Number: [id number] Date of Birth/Sex: Treating RN: 05-03-1951 (71 y.o. [name] Primary Care Posey Petrik: Deland Pretty Other Clinician: Referring Maeola Mchaney: Treating Allison Silva/Extender: Judie Grieve in Treatment: 16 Active Problems Location of Pain Severity and Description of Pain Patient Has Paino No Site Locations Pain Management and Medication Current Pain Management: Electronic Signature(s) Signed: 03/17/2022 3:54:56 PM By: Erenest Blank Signed: 03/17/2022 4:23:35 PM By: Deon Pilling RN, BSN Entered By: Erenest Blank on 03/17/2022 15:04:14 -------------------------------------------------------------------------------- Patient/Caregiver Education Details Patient  Name: Date of Service: [name] 7/27/2023andnbsp3:00 PM Medical Record Number: [id number] Patient Account Number: [id number] Date of Birth/Gender: Treating RN: 24-Mar-1951 (71 y.o. [name] Primary Care Physician: Deland Pretty Other Clinician: Referring Physician: Treating Physician/Extender: Judie Grieve in Treatment: 16 Education Assessment Education Provided To: Patient Education Topics Provided Wound/Skin Impairment: Handouts: Skin Care Do's and Dont's Methods: Explain/Verbal Responses: Reinforcements needed Electronic Signature(s) Signed: 03/17/2022 4:23:35 PM By: Deon Pilling RN, BSN Entered By:  Deon Pilling on 03/17/2022 15:29:15 -------------------------------------------------------------------------------- Wound Assessment Details Patient Name: Date of Service: Christine Debar [name] 03/17/2022 3:00 PM Medical Record Number: [id number] Patient Account Number: [id number] Date of Birth/Sex: Treating RN: 05-Apr-1951 (71 y.o. [name], Meta.Reding Primary Care Trace Wirick: Deland Pretty Other Clinician: Referring Azelia Reiger: Treating Naiomi Musto/Extender: Judie Grieve in Treatment: 16 Wound Status Wound Number: 1 Primary Etiology: Trauma, Other Wound Location: Left, Lateral Lower Leg Wound Status: Open Wounding Event: Trauma Comorbid History: Congestive Heart Failure, Hypertension Date Acquired: 10/18/2021 Weeks Of Treatment: 16 Clustered Wound: Yes Photos Wound Measurements Length: (cm) 0.8 Width: (cm) 1.2 Depth: (cm) 1.6 Clustered Quantity: 1 Area: (cm) 0.754 Volume: (cm) 1.206 % Reduction in Area: 99.6% % Reduction in Volume: 99.9% Epithelialization: Small (1-33%) Tunneling: Yes Position (o'clock): 10 Maximum Distance: (cm) 4.3 Undermining: No Wound Description Classification: Full Thickness Without Exposed Support Structures Wound Margin: Distinct, outline attached Exudate Amount: Medium Exudate Type: Serosanguineous Exudate Color: red, brown Foul Odor After Cleansing: No Slough/Fibrino No Wound Bed Granulation Amount: Large (67-100%) Exposed Structure Granulation Quality: Red Fascia Exposed: No Necrotic Amount: None Present (0%) Fat Layer (Subcutaneous Tissue) Exposed: Yes Tendon Exposed: No Muscle Exposed: No Joint Exposed: No Bone Exposed: No Treatment Notes Wound #1 (Lower Leg) Wound Laterality: Left, Lateral Cleanser Wound Cleanser Discharge Instruction: Cleanse the wound with wound cleanser prior to applying a clean dressing using gauze sponges, not tissue or cotton balls. Peri-Wound Care Skin Prep Discharge Instruction: Use  skin prep as directed Sween Lotion (Moisturizing lotion) Discharge Instruction: Apply moisturizing lotion to left leg with dressing. Topical Primary Dressing Poly Mem Silver Rope Discharge Instruction: Lightly pack into tunnel at 12. Secondary Dressing Zetuvit Plus Silicone Border Dressing 5x5 (in/in) Discharge Instruction: Apply silicone border over primary dressing as directed. Secured With Compression Wrap Compression Stockings Environmental education officer) Signed: 03/17/2022 4:23:35 PM By: Deon Pilling RN, BSN Entered By: Deon Pilling on 03/17/2022 15:29:33 -------------------------------------------------------------------------------- Vitals Details Patient Name: Date of Service: [name], [name] [name] 03/17/2022 3:00 PM Medical Record Number: [id number] Patient Account Number: [id number] Date of Birth/Sex: Treating RN: 1951/08/09 (71 y.o. [name], Meta.Reding Primary Care Makailee Nudelman: Deland Pretty Other Clinician: Referring Omauri Boeve: Treating Theodis Kinsel/Extender: Judie Grieve in Treatment: 16 Vital Signs Time Taken: 15:03 Temperature (F): 98.3 Height (in): 67 Pulse (bpm): 67 Weight (lbs): 340 Respiratory Rate (breaths/min): 18 Body Mass Index (BMI): 53.2 Blood Pressure (mmHg): 142/78 Reference Range: 80 - 120 mg / dl Electronic Signature(s) Signed: 03/17/2022 3:54:56 PM By: Erenest Blank Entered By: Erenest Blank on 03/17/2022 15:03:43

## 2022-03-17 NOTE — Progress Notes (Signed)
[name], [name] ([id number]) Visit Report for 03/17/2022 Chief Complaint Document Details Patient Name: Date of Service: [name] 03/17/2022 3:00 PM Medical Record Number: [id number] Patient Account Number: [id number] Date of Birth/Sex: Treating RN: 01-31-1951 (71 y.o. [name] Primary Care Provider: [name] Other Clinician: Referring Provider: Treating Provider/Extender: Annamary Rummage in Treatment: 16 Information Obtained from: Patient Chief Complaint 11/19/2021; Left lower extremity wound status post fall Electronic Signature(s) Signed: 03/17/2022 4:01:46 PM By: Geralyn Corwin DO Entered By: Geralyn Corwin on 03/17/2022 15:52:31 -------------------------------------------------------------------------------- HPI Details Patient Name: Date of Service: [name], Christine Curls [name] 03/17/2022 3:00 PM Medical Record Number: [id number] Patient Account Number: [id number] Date of Birth/Sex: Treating RN: May 27, 1951 (71 y.o. [name] Primary Care Provider: [name] Other Clinician: Referring Provider: Treating Provider/Extender: Annamary Rummage in Treatment: 16 History of Present Illness HPI Description: Admission 11/19/2021 [name] is a 71 year old female with a past medical history of paroxysmal A-fib on Eliquis, hypothyroidism, major depressive disorder, venous insufficiency and chronic diastolic heart failure that presents to the clinic for a 1 month history of nonhealing wound to the left lower extremity. She visited the ED on 10/18/2021 after a mechanical fall. She developed a hematoma that subsequently opened. She was hospitalized for 7 days and discharged on 10/25/2021. She has been on several different antibiotics For the past month. She states that most recently she was on Levaquin and linezolid for the past week. She states she completes her antibiotic course tomorrow. She has been using  Dakin's wet-to-dry dressings to the wound bed. She denies signs of infection. 4/7; patient presents for follow-up. She has been using Dakin's wet-to-dry dressings. She did end up going to the ED on 4/1 because she had excess bleeding with dressing change that she could not stop. She is on Eliquis for A-fib. In the ED they tied off a small artery. She has had Christine issues since discharge. She denies signs of infection. 4/14; this is a very difficult clinical situation. A patient with underlying chronic venous insufficiency and lymphedema very significant lower extremity edema had a hematoma after a fall on her left upper lateral lower leg. She is on Eliquis for atrial fibrillation apparently with a history of a splenic infarct following with Dr. Berton Mount of cardiology. She has exhibited significant bleeding from the wound surface including ao Venous bleeder that required suturing short while ago. She has been using Dakin's wet-to-dry packing and over the surface of the wound. She saw Dr. Graciela Husbands yesterday he is reluctant to consider stopping the Eliquis because of the prior history of presumed cardioembolism. Wants to communicate with Dr. Mikey Bussing when she returns. In a perfect world where she was not on Eliquis she requires a wound VAC with additional compression wraps but I understand the reluctance to do this because of the concerns of bleeding 4/28; the patient's wound actually looks better today using Dakin's wet-to-dry that she is changing twice a day she is wrapping this with Kerlix and Ace wrapping. She tells me she had 2 small bleeding areas which were part of the superficial wound that stopped this week with direct pressure. Other than that Christine major issues. In follow-up from discussion of last week Dr. 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 The bigger question  from my point of view is the wound VAC issue. As far as she knows and her daughter-in-law verifies that she has not had any bleeding from the deeper parts of the wound although the bleeding has been superficial including the one that sent her to the ER for stitches. She is concerned that a wound VAC would cause further bleeding and I cannot completely allay those concerns. 5/8; patient presents for follow-up. She has Christine issues or complaints today. She has been using Dakin's wet-to-dry dressings without issues. She denies signs of infection. 5/12; patient presents for follow-up. She continues to use Dakin's wet-to-dry dressings without issues. She denies signs of infection. She reports some issues with bleeding at times but this has improved. She denies signs of infection. 6/1; patient presents for follow-up. She was recently hospitalized for upper left leg thigh cellulitis. She was given IV cefepime and vancomycin and discharged on oral antibiotics. She has been using Dakin's wet-to-dry dressings to the left lower leg wound. She reports improvement in healing. She currently denies systemic signs of infection. 6/7; patient is using Dakin's wet-to-dry twice daily. In general this looks better than when I saw this a month or so ago however still considerable depth to the tunnel in her left leg. She is going for iron infusions ordered by her primary care doctor I believe 6/15; patient presents for follow-up. She has been using Dakin's wet-to-dry dressings. She has noted some scattered small areas that blister up and heal on her left lower leg. She has been using mupirocin ointment on them. Nothing open today. 6/23; patient's been using Dakin's wet-to-dry dressings to the tunneled wound and Hydrofera Blue to the opening. She has Christine issues or complaints today. 6/29; patient presents for follow-up. She has been using Dakin's wet-to-dry dressings to the tunneled wound  and Hydrofera Blue to the opening. She states that [facility] is sticking to the wound bed. She denies signs of infection. 7/7; patient presents for follow-up. She has been using Dakin's wet-to-dry dressings to the tunneled wound and PolyMem silver to the opening without issues. 7/13; patient presents for follow-up. She has been using PolyMem silver to the opening and the rope to the tunnel. She has Christine issues or complaints today. She denies signs of infection. 7/20; patient presents for follow-up. We have been using PolyMem silver to the wound bed. She has Christine issues or complaints today. She is receiving her custom compression garments tomorrow. 7/27; patient presents for follow-up. She has been using PolyMem silver to the wound bed. She is having her custom compression garments adjusted as these are not staying on very easily. Electronic Signature(s) Signed: 03/17/2022 4:01:46 PM By: Geralyn Corwin DO Entered By: Geralyn Corwin on 03/17/2022 15:53:36 -------------------------------------------------------------------------------- Physical Exam Details Patient Name: Date of Service: [name] [name] 03/17/2022 3:00 PM Medical Record Number: [id number] Patient Account Number: [id number] Date of Birth/Sex: Treating RN: 03/29/1951 (71 y.o. [name] Primary Care Provider: [name] Other Clinician: Referring Provider: Treating Provider/Extender: Annamary Rummage in Treatment: 16 Constitutional respirations regular, non-labored and within target range for patient.. Cardiovascular 2+ dorsalis pedis/posterior tibialis pulses. Psychiatric pleasant and cooperative. Notes Left lower extremity: Open wound with granulation tissue to the opening and increased depth and tunneling within. Christine signs of surrounding infection. Surrounding periwound his completely epithelialized. Electronic Signature(s) Signed: 03/17/2022 4:01:46 PM By: Geralyn Corwin  DO Entered By: Geralyn Corwin on 03/17/2022 15:54:28 -------------------------------------------------------------------------------- Physician Orders Details Patient Name: Date of Service: [name], Christine  [name] 03/17/2022 3:00 PM Medical Record Number: [id number] Patient Account Number: [id number] Date of Birth/Sex: Treating RN: 1950/09/10 (71 y.o. [name] Primary Care Provider: [name] Other Clinician: Referring Provider: Treating Provider/Extender: Annamary Rummage in Treatment: [phone] Verbal / Phone Orders: Christine Diagnosis Coding ICD-10 Coding Code Description [phone] Non-pressure chronic ulcer of other part of left lower leg with fat layer exposed T79.8XXA Other early complications of trauma, initial encounter I48.0 Paroxysmal atrial fibrillation Z79.01 Long term (current) use of anticoagulants Follow-up Appointments ppointment in 1 week. - Dr. Mikey Bussing and [place], Room 8 03/24/2022 215pm Thursday Return A ppointment in 2 weeks. - Dr. Mikey Bussing and [place], Room 8 03/31/2022 300pm Thursday Return A Other: - Continue to wear compression stockings daily. Bathing/ Shower/ Hygiene May shower with protection but do not get wound dressing(s) wet. Edema Control - Lymphedema / SCD / Other Elevate legs to the level of the heart or above for 30 minutes daily and/or when sitting, a frequency of: - 3-4 times a day throughout the day. Avoid standing for long periods of time. Moisturize legs daily. - both legs every night before bed. Compression stocking or Garment 20-30 mm/Hg pressure to: - patient wear in the morning and remove at night. Christine ace wrap when wearing. Home Health Christine change in wound care orders this week; continue Home Health for wound care. May utilize formulary equivalent dressing for wound treatment orders unless otherwise specified. - change every other day dressings. Other Home Health Orders/Instructions: - [facility] Wound Treatment Wound #1 -  Lower Leg Wound Laterality: Left, Lateral Cleanser: Wound Cleanser Every Other Day/30 Days Discharge Instructions: Cleanse the wound with wound cleanser prior to applying a clean dressing using gauze sponges, not tissue or cotton balls. Peri-Wound Care: Skin Prep Every Other Day/30 Days Discharge Instructions: Use skin prep as directed Peri-Wound Care: Sween Lotion (Moisturizing lotion) Every Other Day/30 Days Discharge Instructions: Apply moisturizing lotion to left leg with dressing. Prim Dressing: Poly Mem Silver Rope Every Other Day/30 Days ary Discharge Instructions: Lightly pack into tunnel at 12. Secondary Dressing: Zetuvit Plus Silicone Border Dressing 5x5 (in/in) Every Other Day/30 Days Discharge Instructions: Apply silicone border over primary dressing as directed. Electronic Signature(s) Signed: 03/17/2022 4:01:46 PM By: Geralyn Corwin DO Entered By: Geralyn Corwin on 03/17/2022 15:54:34 -------------------------------------------------------------------------------- Problem List Details Patient Name: Date of Service: [name], Christine Curls [name] 03/17/2022 3:00 PM Medical Record Number: [id number] Patient Account Number: [id number] Date of Birth/Sex: Treating RN: 1951-05-31 (71 y.o. 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 03/17/2022 3:00 PM Medical Record Number: [id number] Patient Account Number: [id number] Date of Birth/Sex: Treating RN: March 26, 1951 (71 y.o. [name] Primary Care Provider: [name] Other Clinician: Referring Provider: Treating Provider/Extender: Annamary Rummage in Treatment: 16 Subjective Chief Complaint Information obtained from Patient 11/19/2021; Left lower extremity wound status post fall History of  Present Illness (HPI) Admission 11/19/2021 Ms. Katheryne Gorr is a 71 year old female with a past medical history of paroxysmal A-fib on Eliquis, hypothyroidism, major depressive disorder, venous insufficiency and chronic diastolic heart failure that presents to the clinic for a 1 month history of nonhealing wound to the left lower extremity. She visited the ED on 10/18/2021 after a mechanical fall. She developed a hematoma that subsequently opened. She was hospitalized for 7 days and discharged on 10/25/2021. She has been on several different antibiotics For the past month. She states that most recently she was on Levaquin and linezolid for the past week. She states she completes her antibiotic course tomorrow. She has been using Dakin's wet-to-dry dressings to the wound bed. She denies signs of infection. 4/7; patient presents for follow-up. She has been using Dakin's wet-to-dry dressings. She did end up going to the ED on 4/1 because she had excess bleeding with dressing change that she could not stop. She is on Eliquis for A-fib. In the ED they tied off a small artery. She has had Christine issues since discharge. She denies signs of infection. 4/14; this is a very difficult clinical situation. A patient with underlying chronic venous insufficiency and lymphedema very significant lower  extremity edema had a hematoma after a fall on her left upper lateral lower leg. She is on Eliquis for atrial fibrillation apparently with a history of a splenic infarct following with Dr. Berton Mount of cardiology. She has exhibited significant bleeding from the wound surface including ao Venous bleeder that required suturing short while ago. She has been using Dakin's wet-to-dry packing and over the surface of the wound. She saw Dr. Graciela Husbands yesterday he is reluctant to consider stopping the Eliquis because of the prior history of presumed cardioembolism. Wants to communicate with Dr. Mikey Bussing when she returns. In a perfect world where she was not on Eliquis she requires a wound VAC with additional compression wraps but I understand the reluctance to do this because of the concerns of bleeding 4/28; the patient's wound actually looks better today using Dakin's wet-to-dry that she is changing twice a day she is wrapping this with Kerlix and Ace wrapping. She tells me she had 2 small bleeding areas which were part of the superficial wound that stopped this week with direct pressure. Other than that Christine major issues. In follow-up from discussion of last week Dr. 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 The bigger question from my point of view is the wound VAC issue. As far as she knows and her daughter-in-law verifies that she has not had any bleeding from the deeper parts of the wound although the bleeding has been superficial including the one that sent her to the ER for stitches. She is concerned that a wound VAC would cause further bleeding and I cannot completely allay those concerns. 5/8; patient presents for follow-up. She has Christine issues or complaints today. She has been using Dakin's wet-to-dry dressings without issues. She denies  signs of infection. 5/12; patient presents for follow-up. She continues to use Dakin's wet-to-dry dressings without issues. She denies signs of infection. She reports some issues with bleeding at times but this has improved. She denies signs of infection. 6/1; patient presents for follow-up. She was recently hospitalized for upper left leg thigh cellulitis. She was given IV cefepime and  vancomycin and discharged on oral antibiotics. She has been using Dakin's wet-to-dry dressings to the left lower leg wound. She reports improvement in healing. She currently denies systemic signs of infection. 6/7; patient is using Dakin's wet-to-dry twice daily. In general this looks better than when I saw this a month or so ago however still considerable depth to the tunnel in her left leg. She is going for iron infusions ordered by her primary care doctor I believe 6/15; patient presents for follow-up. She has been using Dakin's wet-to-dry dressings. She has noted some scattered small areas that blister up and heal on her left lower leg. She has been using mupirocin ointment on them. Nothing open today. 6/23; patient's been using Dakin's wet-to-dry dressings to the tunneled wound and Hydrofera Blue to the opening. She has Christine issues or complaints today. 6/29; patient presents for follow-up. She has been using Dakin's wet-to-dry dressings to the tunneled wound and Hydrofera Blue to the opening. She states that [facility] is sticking to the wound bed. She denies signs of infection. 7/7; patient presents for follow-up. She has been using Dakin's wet-to-dry dressings to the tunneled wound and PolyMem silver to the opening without issues. 7/13; patient presents for follow-up. She has been using PolyMem silver to the opening and the rope to the tunnel. She has Christine issues or complaints today. She denies signs of infection. 7/20; patient presents for follow-up. We have been using PolyMem silver to the wound bed. She has  Christine issues or complaints today. She is receiving her custom compression garments tomorrow. 7/27; patient presents for follow-up. She has been using PolyMem silver to the wound bed. She is having her custom compression garments adjusted as these are not staying on very easily. Patient History Medical History Cardiovascular Patient has history of Congestive Heart Failure, Hypertension Hospitalization/Surgery History - Cellulitis left leg- 01/03/2022-01/07/2022. Medical A Surgical History Notes nd Constitutional Symptoms (General Health) Infarction of spleen Hematologic/Lymphatic Hypothyroidism Cardiovascular A-Fib Gastrointestinal Gastroesophageal reflux Genitourinary Chronic kidney disease Musculoskeletal Osteoarthritis of knee Psychiatric Anxiety Objective Constitutional respirations regular, non-labored and within target range for patient.. Vitals Time Taken: 3:03 PM, Height: 67 in, Weight: 340 lbs, BMI: 53.2, Temperature: 98.3 F, Pulse: 67 bpm, Respiratory Rate: 18 breaths/min, Blood Pressure: 142/78 mmHg. Cardiovascular 2+ dorsalis pedis/posterior tibialis pulses. Psychiatric pleasant and cooperative. General Notes: Left lower extremity: Open wound with granulation tissue to the opening and increased depth and tunneling within. Christine signs of surrounding infection. Surrounding periwound his completely epithelialized. Integumentary (Hair, Skin) Wound #1 status is Open. Original cause of wound was Trauma. The date acquired was: 10/18/2021. The wound has been in treatment 16 weeks. The wound is located on the Left,Lateral Lower Leg. The wound measures 0.8cm length x 1.2cm width x 1.6cm depth; 0.754cm^2 area and 1.206cm^3 volume. There is Fat Layer (Subcutaneous Tissue) exposed. There is Christine undermining noted, however, there is tunneling at 10:00 with a maximum distance of 4.3cm. There is a medium amount of serosanguineous drainage noted. The wound margin is distinct with the outline  attached to the wound base. There is large (67-100%) red granulation within the wound bed. There is Christine necrotic tissue within the wound bed. Assessment Active Problems ICD-10 Non-pressure chronic ulcer of other part of left lower leg with fat layer exposed Other early complications of trauma, initial encounter Paroxysmal atrial fibrillation Long term (current) use of anticoagulants Patient's wound is stable. Christine signs of surrounding infection. I recommended continuing PolyMem silver. If there is Christine improvement in tunneling size  will need to consider wound VAC. Plan Follow-up Appointments: Return Appointment in 1 week. - Dr. Mikey [place]uss[place]offman [place], [place] 03/24/2022 215pm Thursday Return Appointment in 2 weeks. - Dr. [place]key [place]offman [place] 03/31/2022 300pm Thursday Other: - Continue to wear compression stockings daily. Bathing/ Shower/ Hygiene: May shower with protection but do not get wound dressing(s) wet. Edema Control - Lymphedema / SCD / Other: Elevate legs to the level of the heart or above for 30 minutes daily and/or when sitting, a frequency of: - 3-4 times a day throughout the day. Avoid standing for long periods of time. Moisturize legs daily. - both legs every night before bed. Compression stocking or Garment 20-30 mm/Hg pressure to: - patient wear in the morning and remove at night. Christine ace wrap when wearing. Home Health: Christine change in wound care orders this week; continue Home Health for wound care. May utilize formulary equivalent dressing for wound treatment orders unless otherwise specified. - change every other day dressings. Other Home Health Orders/Instructions: - [facility] WOUND #1: - Lower Leg Wound Laterality: Left, Lateral Cleanser: Wound Cleanser Every Other Day/30 Days Discharge Instructions: Cleanse the wound with wound cleanser prior to applying a clean dressing using gauze sponges, not tissue or cotton balls. Peri-Wound Care: Skin Prep Every Other Day/30  Days Discharge Instructions: Use skin prep as directed Peri-Wound Care: Sween Lotion (Moisturizing lotion) Every Other Day/30 Days Discharge Instructions: Apply moisturizing lotion to left leg with dressing. Prim Dressing: Poly Mem Silver Rope Every Other Day/30 Days ary Discharge Instructions: Lightly pack into tunnel at 12. Secondary Dressing: Zetuvit Plus Silicone Border Dressing 5x5 (in/in) Every Other Day/30 Days Discharge Instructions: Apply silicone border over primary dressing as directed. 1. PolyMem silver 2. Follow-up in 1 week Electronic Signature(s) Signed: 03/17/2022 4:01:46 PM By: Geralyn CorwinHoffman, Wade Asebedo DO Entered By: Geralyn CorwinHoffman, Nereida Schepp on 03/17/2022 15:55:55 -------------------------------------------------------------------------------- HxROS Details Patient Name: Date of Service: Christine PaliNO [name], Christine [name] 03/17/2022 3:00 PM Medical Record Number: [id number] Patient Account Number: [id number] Date of Birth/Sex: Treating RN: 12/15/1950 (71 y.o. Arta SilenceF) Deaton, Bobbi Primary Care Provider: Other Clinician: Merri BrunettePharr, Walter Referring Provider: Treating Provider/Extender: Annamary RummageHoffman, Dushawn Pusey Pharr, Walter Weeks in Treatment: 16 Constitutional Symptoms (General Health) Medical History: Past Medical History Notes: Infarction of spleen Hematologic/Lymphatic Medical History: Past Medical History Notes: Hypothyroidism Cardiovascular Medical History: Positive for: Congestive Heart Failure; Hypertension Past Medical History Notes: A-Fib Gastrointestinal Medical History: Past Medical History Notes: Gastroesophageal reflux Genitourinary Medical History: Past Medical History Notes: Chronic kidney disease Musculoskeletal Medical History: Past Medical History Notes: Osteoarthritis of knee Psychiatric Medical History: Past Medical History Notes: Anxiety Immunizations Pneumococcal Vaccine: Received Pneumococcal Vaccination: Christine Implantable Devices Christine devices added Hospitalization /  Surgery History Type of Hospitalization/Surgery Cellulitis left leg- 01/03/2022-01/07/2022 Electronic Signature(s) Signed: 03/17/2022 4:01:46 PM By: Geralyn CorwinHoffman, Eather Chaires DO Signed: 03/17/2022 4:23:35 PM By: Shawn Stalleaton, Bobbi RN, BSN Entered By: Geralyn CorwinHoffman, Alyze Lauf on 03/17/2022 15:53:40 -------------------------------------------------------------------------------- SuperBill Details Patient Name: Date of Service: Christine MunsonNO [name], Christine [name] 03/17/2022 Medical Record Number: [id number] Patient Account Number: [id number] Date of Birth/Sex: Treating RN: 12/11/1950 (71 y.o. Arta SilenceF) Deaton, Bobbi Primary Care Provider: Merri BrunettePharr, Walter Other Clinician: Referring Provider: Treating Provider/Extender: Annamary RummageHoffman, Talan Gildner Pharr, Walter Weeks in Treatment: 16 Diagnosis Coding ICD-10 Codes Code Description [phone] Non-pressure chronic ulcer of other part of left lower leg with fat layer exposed T79.8XXA Other early complications of trauma, initial encounter I48.0 Paroxysmal atrial fibrillation Z79.01 Long term (current) use of anticoagulants Facility Procedures CPT4 Code: [id number] Description: 99213 - WOUND CARE VISIT-LEV 3 EST  PT Modifier: Quantity: 1 Physician Procedures : CPT4 Code Description Modifier [id number] 99213 - WC PHYS LEVEL 3 - EST PT ICD-10 Diagnosis Description L97.822 Non-pressure chronic ulcer of other part of left lower leg with fat layer exposed T79.8XXA Other early complications of trauma, initial  encounter I48.0 Paroxysmal atrial fibrillation Z79.01 Long term (current) use of anticoagulants Quantity: 1 Electronic Signature(s) Signed: 03/17/2022 4:01:46 PM By: Geralyn Corwin DO Entered By: Geralyn Corwin on 03/17/2022 15:56:16

## 2022-03-22 ENCOUNTER — Encounter: Payer: PPO | Admitting: Internal Medicine

## 2022-03-23 DIAGNOSIS — Z9049 Acquired absence of other specified parts of digestive tract: Secondary | ICD-10-CM | POA: Diagnosis not present

## 2022-03-23 DIAGNOSIS — I10 Essential (primary) hypertension: Secondary | ICD-10-CM | POA: Diagnosis not present

## 2022-03-23 DIAGNOSIS — I5032 Chronic diastolic (congestive) heart failure: Secondary | ICD-10-CM | POA: Diagnosis not present

## 2022-03-23 DIAGNOSIS — F325 Major depressive disorder, single episode, in full remission: Secondary | ICD-10-CM | POA: Diagnosis not present

## 2022-03-23 DIAGNOSIS — Z96653 Presence of artificial knee joint, bilateral: Secondary | ICD-10-CM | POA: Diagnosis not present

## 2022-03-23 DIAGNOSIS — Z515 Encounter for palliative care: Secondary | ICD-10-CM | POA: Diagnosis not present

## 2022-03-23 DIAGNOSIS — Z6841 Body Mass Index (BMI) 40.0 and over, adult: Secondary | ICD-10-CM | POA: Diagnosis not present

## 2022-03-23 DIAGNOSIS — D692 Other nonthrombocytopenic purpura: Secondary | ICD-10-CM | POA: Diagnosis not present

## 2022-03-23 DIAGNOSIS — I4891 Unspecified atrial fibrillation: Secondary | ICD-10-CM | POA: Diagnosis not present

## 2022-03-23 DIAGNOSIS — T24332D Burn of third degree of left lower leg, subsequent encounter: Secondary | ICD-10-CM | POA: Diagnosis not present

## 2022-03-23 DIAGNOSIS — J309 Allergic rhinitis, unspecified: Secondary | ICD-10-CM | POA: Diagnosis not present

## 2022-03-23 DIAGNOSIS — Z7901 Long term (current) use of anticoagulants: Secondary | ICD-10-CM | POA: Diagnosis not present

## 2022-03-23 DIAGNOSIS — F330 Major depressive disorder, recurrent, mild: Secondary | ICD-10-CM | POA: Diagnosis not present

## 2022-03-23 DIAGNOSIS — E039 Hypothyroidism, unspecified: Secondary | ICD-10-CM | POA: Diagnosis not present

## 2022-03-23 DIAGNOSIS — L03116 Cellulitis of left lower limb: Secondary | ICD-10-CM | POA: Diagnosis not present

## 2022-03-23 DIAGNOSIS — E871 Hypo-osmolality and hyponatremia: Secondary | ICD-10-CM | POA: Diagnosis not present

## 2022-03-23 DIAGNOSIS — D500 Iron deficiency anemia secondary to blood loss (chronic): Secondary | ICD-10-CM | POA: Diagnosis not present

## 2022-03-23 DIAGNOSIS — I110 Hypertensive heart disease with heart failure: Secondary | ICD-10-CM | POA: Diagnosis not present

## 2022-03-23 DIAGNOSIS — I480 Paroxysmal atrial fibrillation: Secondary | ICD-10-CM | POA: Diagnosis not present

## 2022-03-23 DIAGNOSIS — Z9181 History of falling: Secondary | ICD-10-CM | POA: Diagnosis not present

## 2022-03-23 DIAGNOSIS — L97829 Non-pressure chronic ulcer of other part of left lower leg with unspecified severity: Secondary | ICD-10-CM | POA: Diagnosis not present

## 2022-03-24 ENCOUNTER — Encounter (HOSPITAL_BASED_OUTPATIENT_CLINIC_OR_DEPARTMENT_OTHER): Payer: PPO | Attending: Internal Medicine | Admitting: Internal Medicine

## 2022-03-24 DIAGNOSIS — T798XXA Other early complications of trauma, initial encounter: Secondary | ICD-10-CM

## 2022-03-24 DIAGNOSIS — E039 Hypothyroidism, unspecified: Secondary | ICD-10-CM | POA: Insufficient documentation

## 2022-03-24 DIAGNOSIS — Z7901 Long term (current) use of anticoagulants: Secondary | ICD-10-CM | POA: Diagnosis not present

## 2022-03-24 DIAGNOSIS — I890 Lymphedema, not elsewhere classified: Secondary | ICD-10-CM | POA: Insufficient documentation

## 2022-03-24 DIAGNOSIS — I5032 Chronic diastolic (congestive) heart failure: Secondary | ICD-10-CM | POA: Diagnosis not present

## 2022-03-24 DIAGNOSIS — I872 Venous insufficiency (chronic) (peripheral): Secondary | ICD-10-CM | POA: Diagnosis not present

## 2022-03-24 DIAGNOSIS — N189 Chronic kidney disease, unspecified: Secondary | ICD-10-CM | POA: Insufficient documentation

## 2022-03-24 DIAGNOSIS — I480 Paroxysmal atrial fibrillation: Secondary | ICD-10-CM | POA: Insufficient documentation

## 2022-03-24 DIAGNOSIS — L97822 Non-pressure chronic ulcer of other part of left lower leg with fat layer exposed: Secondary | ICD-10-CM | POA: Insufficient documentation

## 2022-03-24 DIAGNOSIS — I130 Hypertensive heart and chronic kidney disease with heart failure and stage 1 through stage 4 chronic kidney disease, or unspecified chronic kidney disease: Secondary | ICD-10-CM | POA: Insufficient documentation

## 2022-03-24 NOTE — Progress Notes (Signed)
[name], GERSTENBERGER ([id number]) Visit Report for 03/24/2022 Chief Complaint Document Details Patient Name: Date of Service: [name] 03/24/2022 2:15 PM Medical Record Number: [id number] Patient Account Number: [id number] Date of Birth/Sex: Treating RN: [birth year] (71 y.o. [name] Primary Care Provider: [name] Other Clinician: Referring Provider: Treating Provider/Extender: Annamary Rummage in Treatment: 17 Information Obtained from: Patient Chief Complaint 11/19/2021; Left lower extremity wound status post fall Electronic Signature(s) Signed: 03/24/2022 3:55:06 PM By: Geralyn Corwin DO Entered By: Geralyn Corwin on 03/24/2022 15:07:22 -------------------------------------------------------------------------------- HPI Details Patient Name: Date of Service: Marthenia Rolling [name] 03/24/2022 2:15 PM Medical Record Number: [id number] Patient Account Number: [id number] Date of Birth/Sex: Treating RN: 1951-06-13 (71 y.o. [name] Primary Care Provider: [name] Other Clinician: Referring Provider: Treating Provider/Extender: Annamary Rummage in Treatment: 17 History of Present Illness HPI Description: Admission 11/19/2021 [name] is a 71 year old female with a past medical history of paroxysmal A-fib on Eliquis, hypothyroidism, major depressive disorder, venous insufficiency and chronic diastolic heart failure that presents to the clinic for a 1 month history of nonhealing wound to the left lower extremity. She visited the ED on 10/18/2021 after a mechanical fall. She developed a hematoma that subsequently opened. She was hospitalized for 7 days and discharged on 10/25/2021. She has been on several different antibiotics For the past month. She states that most recently she was on Levaquin and linezolid for the past week. She states she completes her antibiotic course tomorrow. She has been using Dakin's  wet-to-dry dressings to the wound bed. She denies signs of infection. 4/7; patient presents for follow-up. She has been using Dakin's wet-to-dry dressings. She did end up going to the ED on 4/1 because she had excess bleeding with dressing change that she could not stop. She is on Eliquis for A-fib. In the ED they tied off a small artery. She has had no issues since discharge. She denies signs of infection. 4/14; this is a very difficult clinical situation. A patient with underlying chronic venous insufficiency and lymphedema very significant lower extremity edema had a hematoma after a fall on her left upper lateral lower leg. She is on Eliquis for atrial fibrillation apparently with a history of a splenic infarct following with Dr. Berton Mount of cardiology. She has exhibited significant bleeding from the wound surface including ao Venous bleeder that required suturing short while ago. She has been using Dakin's wet-to-dry packing and over the surface of the wound. She saw Dr. Graciela Husbands yesterday he is reluctant to consider stopping the Eliquis because of the prior history of presumed cardioembolism. Wants to communicate with Dr. Mikey Bussing when she returns. In a perfect world where she was not on Eliquis she requires a wound VAC with additional compression wraps but I understand the reluctance to do this because of the concerns of bleeding 4/28; the patient's wound actually looks better today using Dakin's wet-to-dry that she is changing twice a day she is wrapping this with Kerlix and Ace wrapping. She tells me she had 2 small bleeding areas which were part of the superficial wound that stopped this week with direct pressure. Other than that no major issues. In follow-up from discussion of last week Dr. 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 The bigger question  from my point of view is the wound VAC issue. As far as she knows and her daughter-in-law verifies that she has not had any bleeding from the deeper parts of the wound although the bleeding has been superficial including the one that sent her to the ER for stitches. She is concerned that a wound VAC would cause further bleeding and I cannot completely allay those concerns. 5/8; patient presents for follow-up. She has no issues or complaints today. She has been using Dakin's wet-to-dry dressings without issues. She denies signs of infection. 5/12; patient presents for follow-up. She continues to use Dakin's wet-to-dry dressings without issues. She denies signs of infection. She reports some issues with bleeding at times but this has improved. She denies signs of infection. 6/1; patient presents for follow-up. She was recently hospitalized for upper left leg thigh cellulitis. She was given IV cefepime and vancomycin and discharged on oral antibiotics. She has been using Dakin's wet-to-dry dressings to the left lower leg wound. She reports improvement in healing. She currently denies systemic signs of infection. 6/7; patient is using Dakin's wet-to-dry twice daily. In general this looks better than when I saw this a month or so ago however still considerable depth to the tunnel in her left leg. She is going for iron infusions ordered by her primary care doctor I believe 6/15; patient presents for follow-up. She has been using Dakin's wet-to-dry dressings. She has noted some scattered small areas that blister up and heal on her left lower leg. She has been using mupirocin ointment on them. Nothing open today. 6/23; patient's been using Dakin's wet-to-dry dressings to the tunneled wound and Hydrofera Blue to the opening. She has no issues or complaints today. 6/29; patient presents for follow-up. She has been using Dakin's wet-to-dry dressings to the tunneled wound and  Hydrofera Blue to the opening. She states that [name] is sticking to the wound bed. She denies signs of infection. 7/7; patient presents for follow-up. She has been using Dakin's wet-to-dry dressings to the tunneled wound and PolyMem silver to the opening without issues. 7/13; patient presents for follow-up. She has been using PolyMem silver to the opening and the rope to the tunnel. She has no issues or complaints today. She denies signs of infection. 7/20; patient presents for follow-up. We have been using PolyMem silver to the wound bed. She has no issues or complaints today. She is receiving her custom compression garments tomorrow. 7/27; patient presents for follow-up. She has been using PolyMem silver to the wound bed. She is having her custom compression garments adjusted as these are not staying on very easily. 8/30; patient presents for follow-up. We have been using PolyMem silver to the wound bed. She is still waiting on her custom compression garments to be ordered. She denies signs of infection. Electronic Signature(s) Signed: 03/24/2022 3:55:06 PM By: Geralyn Corwin DO Entered By: Geralyn Corwin on 03/24/2022 15:08:45 -------------------------------------------------------------------------------- Physical Exam Details Patient Name: Date of Service: [name] 03/24/2022 2:15 PM Medical Record Number: [id number] Patient Account Number: [id number] Date of Birth/Sex: Treating RN: [birth year] (71 y.o. [name] Primary Care Provider: [name] Other Clinician: Referring Provider: Treating Provider/Extender: Annamary Rummage in Treatment: 17 Constitutional respirations regular, non-labored and within target range for patient.. Cardiovascular 2+ dorsalis pedis/posterior tibialis pulses. Psychiatric pleasant and cooperative. Notes Left lower extremity: Open wound with granulation tissue to the opening and increased depth and  tunneling within. No signs of surrounding infection. Electronic Signature(s)  Signed: 03/24/2022 3:55:06 PM By: Geralyn Corwin DO Entered By: Geralyn Corwin on 03/24/2022 15:09:04 -------------------------------------------------------------------------------- Physician Orders Details Patient Name: Date of Service: [name]. 03/24/2022 2:15 PM Medical Record Number: [id number] Patient Account Number: [id number] Date of Birth/Sex: Treating RN: [birth year] (71 y.o. [name] Primary Care Provider: [name] Other Clinician: Referring Provider: Treating Provider/Extender: Annamary Rummage in Treatment: [phone] Verbal / Phone Orders: No Diagnosis Coding ICD-10 Coding Code Description [phone] Non-pressure chronic ulcer of other part of left lower leg with fat layer exposed T79.8XXA Other early complications of trauma, initial encounter I48.0 Paroxysmal atrial fibrillation Z79.01 Long term (current) use of anticoagulants Follow-up Appointments ppointment in 1 week. - Dr. Mikey Bussing and [place], Room 8 03/31/2022 300pm Thursday Return A ppointment in 2 weeks. - Dr. Mikey Bussing and [place], Room 8 04/05/2022 0900am Tuesday Return A Other: - Continue to wear compression stockings daily. Medical Modalities wound vac. Bathing/ Shower/ Hygiene May shower with protection but do not get wound dressing(s) wet. Negative Presssure Wound Therapy Wound Vac to wound continuously at 171mm/hg pressure Black and White Foam combination - Lightly pack white foam into the tunnel and undermining. Home health change three times a week Monday, Wednesday, and Friday. Wound center will apply dakin's wet to dry in clinic. Edema Control - Lymphedema / SCD / Other Elevate legs to the level of the heart or above for 30 minutes daily and/or when sitting, a frequency of: - 3-4 times a day throughout the day. Avoid standing for long periods of time. Moisturize legs daily. - both legs every  night before bed. Compression stocking or Garment 20-30 mm/Hg pressure to: - patient wear in the morning and remove at night. no ace wrap when wearing. Home Health New wound care orders this week; continue Home Health for wound care. May utilize formulary equivalent dressing for wound treatment orders unless otherwise specified. - Start wound vac once it arrives. white and black foam Lightly pack white foam into the tunnel and undermining. Home health change three times a week Monday, Wednesday, and Friday. Wound center will apply dakin's wet to dry in clinic. may use duoderm to periwound for protection from the drape. Other Home Health Orders/Instructions: - [facility] Wound Treatment Wound #1 - Lower Leg Wound Laterality: Left, Lateral Cleanser: Wound Cleanser 1 x Per Day/30 Days Discharge Instructions: Cleanse the wound with wound cleanser prior to applying a clean dressing using gauze sponges, not tissue or cotton balls. Peri-Wound Care: Skin Prep 1 x Per Day/30 Days Discharge Instructions: Use skin prep as directed Peri-Wound Care: Sween Lotion (Moisturizing lotion) 1 x Per Day/30 Days Discharge Instructions: Apply moisturizing lotion to left leg with dressing. Prim Dressing: Dakin's Solution 0.25%, 16 (oz) 1 x Per Day/30 Days ary Discharge Instructions: Moisten gauze with Dakin's solution daily until until wound vac arrives. Secondary Dressing: Zetuvit Plus Silicone Border Dressing 5x5 (in/in) 1 x Per Day/30 Days Discharge Instructions: Apply silicone border over primary dressing as directed. Electronic Signature(s) Signed: 03/24/2022 3:55:06 PM By: Geralyn Corwin DO Entered By: Geralyn Corwin on 03/24/2022 15:09:15 -------------------------------------------------------------------------------- Problem List Details Patient Name: Date of Service: [name]. 03/24/2022 2:15 PM Medical Record Number: [id number] Patient Account Number: [id number] Date of Birth/Sex:  Treating RN: [birth year] (71 y.o. [name] Primary Care Provider: [name] Other Clinician: Referring Provider: Treating Provider/Extender: Annamary Rummage in Treatment: 17 Active Problems ICD-10 Encounter Code Description Active Date MDM Diagnosis [phone] Non-pressure chronic ulcer of  other part of left lower leg with fat layer exposed3/31/2023 No Yes T79.8XXA Other early complications of trauma, initial encounter 11/19/2021 No Yes I48.0 Paroxysmal atrial fibrillation 11/19/2021 No Yes Z79.01 Long term (current) use of anticoagulants 11/19/2021 No Yes Inactive Problems Resolved Problems Electronic Signature(s) Signed: 03/24/2022 3:55:06 PM By: Geralyn CorwinHoffman, Bradley Bostelman DO Entered By: Geralyn CorwinHoffman, Zeeva Courser on 03/24/2022 15:07:02 -------------------------------------------------------------------------------- Progress Note Details Patient Name: Date of Service: Christine MunsonNO RRIS, [name] 03/24/2022 2:15 PM Medical Record Number: [id number] Patient Account Number: [id number] Date of Birth/Sex: Treating RN: 12/27/1950 (71 y.o. Arta SilenceF) Deaton, Bobbi Primary Care Provider: Merri BrunettePharr, Walter Other Clinician: Referring Provider: Treating Provider/Extender: Annamary RummageHoffman, Raylynn Hersh Pharr, Walter Weeks in Treatment: 17 Subjective Chief Complaint Information obtained from Patient 11/19/2021; Left lower extremity wound status post fall History of Present Illness (HPI) Admission 11/19/2021 Ms. Marlynn PerkingGwendolyn Morris is a 71 year old female with a past medical history of paroxysmal A-fib on Eliquis, hypothyroidism, major depressive disorder, venous insufficiency and chronic diastolic heart failure that presents to the clinic for a 1 month history of nonhealing wound to the left lower extremity. She visited the ED on 10/18/2021 after a mechanical fall. She developed a hematoma that subsequently opened. She was hospitalized for 7 days and discharged on 10/25/2021. She has been on several different antibiotics For  the past month. She states that most recently she was on Levaquin and linezolid for the past week. She states she completes her antibiotic course tomorrow. She has been using Dakin's wet-to-dry dressings to the wound bed. She denies signs of infection. 4/7; patient presents for follow-up. She has been using Dakin's wet-to-dry dressings. She did end up going to the ED on 4/1 because she had excess bleeding with dressing change that she could not stop. She is on Eliquis for A-fib. In the ED they tied off a small artery. She has had no issues since discharge. She denies signs of infection. 4/14; this is a very difficult clinical situation. A patient with underlying chronic venous insufficiency and lymphedema very significant lower extremity edema had a hematoma after a fall on her left upper lateral lower leg. She is on Eliquis for atrial fibrillation apparently with a history of a splenic infarct following with Dr. Berton MountSteve Klein of cardiology. She has exhibited significant bleeding from the wound surface including ao Venous bleeder that required suturing short while ago. She has been using Dakin's wet-to-dry packing and over the surface of the wound. She saw Dr. Graciela HusbandsKlein yesterday he is reluctant to consider stopping the Eliquis because of the prior history of presumed cardioembolism. Wants to communicate with Dr. Mikey BussingHoffman when she returns. In a perfect world where she was not on Eliquis she requires a wound VAC with additional compression wraps but I understand the reluctance to do this because of the concerns of bleeding 4/28; the patient's wound actually looks better today using Dakin's wet-to-dry that she is changing twice a day she is wrapping this with Kerlix and Ace wrapping. She tells me she had 2 small bleeding areas which were part of the superficial wound that stopped this week with direct pressure. Other than that no major issues. In follow-up from discussion of last week Dr. 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 The bigger question from my point of view is the wound VAC issue. As far as she knows and her daughter-in-law  verifies that she has not had any bleeding from the deeper parts of the wound although the bleeding has been superficial including the one that sent her to the ER for stitches. She is concerned that a wound VAC would cause further bleeding and I cannot completely allay those concerns. 5/8; patient presents for follow-up. She has no issues or complaints today. She has been using Dakin's wet-to-dry dressings without issues. She denies signs of infection. 5/12; patient presents for follow-up. She continues to use Dakin's wet-to-dry dressings without issues. She denies signs of infection. She reports some issues with bleeding at times but this has improved. She denies signs of infection. 6/1; patient presents for follow-up. She was recently hospitalized for upper left leg thigh cellulitis. She was given IV cefepime and vancomycin and discharged on oral antibiotics. She has been using Dakin's wet-to-dry dressings to the left lower leg wound. She reports improvement in healing. She currently denies systemic signs of infection. 6/7; patient is using Dakin's wet-to-dry twice daily. In general this looks better than when I saw this a month or so ago however still considerable depth to the tunnel in her left leg. She is going for iron infusions ordered by her primary care doctor I believe 6/15; patient presents for follow-up. She has been using Dakin's wet-to-dry dressings. She has noted some scattered small areas that blister up and heal on her left lower leg. She has been using mupirocin ointment on them. Nothing open today. 6/23; patient's been using Dakin's wet-to-dry dressings to the tunneled wound  and Hydrofera Blue to the opening. She has no issues or complaints today. 6/29; patient presents for follow-up. She has been using Dakin's wet-to-dry dressings to the tunneled wound and Hydrofera Blue to the opening. She states that [employer] is sticking to the wound bed. She denies signs of infection. 7/7; patient presents for follow-up. She has been using Dakin's wet-to-dry dressings to the tunneled wound and PolyMem silver to the opening without issues. 7/13; patient presents for follow-up. She has been using PolyMem silver to the opening and the rope to the tunnel. She has no issues or complaints today. She denies signs of infection. 7/20; patient presents for follow-up. We have been using PolyMem silver to the wound bed. She has no issues or complaints today. She is receiving her custom compression garments tomorrow. 7/27; patient presents for follow-up. She has been using PolyMem silver to the wound bed. She is having her custom compression garments adjusted as these are not staying on very easily. 8/30; patient presents for follow-up. We have been using PolyMem silver to the wound bed. She is still waiting on her custom compression garments to be ordered. She denies signs of infection. Patient History Medical History Cardiovascular Patient has history of Congestive Heart Failure, Hypertension Hospitalization/Surgery History - Cellulitis left leg- 01/03/2022-01/07/2022. Medical A Surgical History Notes nd Constitutional Symptoms (General Health) Infarction of spleen Hematologic/Lymphatic Hypothyroidism Cardiovascular A-Fib Gastrointestinal Gastroesophageal reflux Genitourinary Chronic kidney disease Musculoskeletal Osteoarthritis of knee Psychiatric Anxiety Objective Constitutional respirations regular, non-labored and within target range for patient.. Vitals Time Taken: 2:02 PM, Height: 67 in, Weight: 340 lbs, BMI: 53.2, Temperature: 98.6 F, Pulse: 60 bpm, Respiratory  Rate: 18 breaths/min, Blood Pressure: 148/77 mmHg. Cardiovascular 2+ dorsalis pedis/posterior tibialis pulses. Psychiatric pleasant and cooperative. General Notes: Left lower extremity: Open wound with granulation tissue to the opening and increased depth and tunneling within. No signs of surrounding infection. Integumentary (Hair, Skin) Wound #1 status is Open. Original cause of wound was  Trauma. The date acquired was: 10/18/2021. The wound has been in treatment 17 weeks. The wound is located on the Left,Lateral Lower Leg. The wound measures 2cm length x 1.6cm width x 1.6cm depth; 2.513cm^2 area and 4.021cm^3 volume. There is Fat Layer (Subcutaneous Tissue) exposed. There is no undermining noted, however, there is tunneling at 11:00 with a maximum distance of 4.2cm. There is a medium amount of serosanguineous drainage noted. The wound margin is distinct with the outline attached to the wound base. There is large (67-100%) red granulation within the wound bed. There is no necrotic tissue within the wound bed. Assessment Active Problems ICD-10 Non-pressure chronic ulcer of other part of left lower leg with fat layer exposed Other early complications of trauma, initial encounter Paroxysmal atrial fibrillation Long term (current) use of anticoagulants Patient's wound is stable. No signs of infection. She has stalled in her tunneling size for the past several weeks. At this time I recommended starting a wound VAC to see if this will help facilitate further wound healing. For now she can do Dakin's wet-to-dry dressings by the time the wound VAC arrives. We will using white and black foam. Plan Follow-up Appointments: Return Appointment in 1 week. - Dr. Mikey Bussing and [place], Room 8 03/31/2022 300pm Thursday Return Appointment in 2 weeks. - Dr. Mikey Bussing and [place], Room 8 04/05/2022 0900am Tuesday Other: - Continue to wear compression stockings daily. Medical Modalities wound vac. Bathing/ Shower/  Hygiene: May shower with protection but do not get wound dressing(s) wet. Negative Presssure Wound Therapy: Wound Vac to wound continuously at 167mm/hg pressure Black and White Foam combination - Lightly pack white foam into the tunnel and undermining. Home health change three times a week Monday, Wednesday, and Friday. Wound center will apply dakin's wet to dry in clinic. Edema Control - Lymphedema / SCD / Other: Elevate legs to the level of the heart or above for 30 minutes daily and/or when sitting, a frequency of: - 3-4 times a day throughout the day. Avoid standing for long periods of time. Moisturize legs daily. - both legs every night before bed. Compression stocking or Garment 20-30 mm/Hg pressure to: - patient wear in the morning and remove at night. no ace wrap when wearing. Home Health: New wound care orders this week; continue Home Health for wound care. May utilize formulary equivalent dressing for wound treatment orders unless otherwise specified. - Start wound vac once it arrives. white and black foam Lightly pack white foam into the tunnel and undermining. Home health change three times a week Monday, Wednesday, and Friday. Wound center will apply dakin's wet to dry in clinic. may use duoderm to periwound for protection from the drape. Other Home Health Orders/Instructions: - [facility] WOUND #1: - Lower Leg Wound Laterality: Left, Lateral Cleanser: Wound Cleanser 1 x Per Day/30 Days Discharge Instructions: Cleanse the wound with wound cleanser prior to applying a clean dressing using gauze sponges, not tissue or cotton balls. Peri-Wound Care: Skin Prep 1 x Per Day/30 Days Discharge Instructions: Use skin prep as directed Peri-Wound Care: Sween Lotion (Moisturizing lotion) 1 x Per Day/30 Days Discharge Instructions: Apply moisturizing lotion to left leg with dressing. Prim Dressing: Dakin's Solution 0.25%, 16 (oz) 1 x Per Day/30 Days ary Discharge Instructions: Moisten  gauze with Dakin's solution daily until until wound vac arrives. Secondary Dressing: Zetuvit Plus Silicone Border Dressing 5x5 (in/in) 1 x Per Day/30 Days Discharge Instructions: Apply silicone border over primary dressing as directed. 1. Dakin's wet-to-dry 2. Start wound  VAC 3. Follow-up in 1 week Electronic Signature(s) Signed: 03/24/2022 3:55:06 PM By: Geralyn Corwin DO Entered By: Geralyn Corwin on 03/24/2022 15:11:02 -------------------------------------------------------------------------------- HxROS Details Patient Name: Date of Service: Marthenia Rolling [name] 03/24/2022 2:15 PM Medical Record Number: [id number] Patient Account Number: [id number] Date of Birth/Sex: Treating RN: 09/12/1950 (72 y.o. [name] Primary Care Provider: [name] Other Clinician: Referring Provider: Treating Provider/Extender: Annamary Rummage in Treatment: 17 Constitutional Symptoms (General Health) Medical History: Past Medical History Notes: Infarction of spleen Hematologic/Lymphatic Medical History: Past Medical History Notes: Hypothyroidism Cardiovascular Medical History: Positive for: Congestive Heart Failure; Hypertension Past Medical History Notes: A-Fib Gastrointestinal Medical History: Past Medical History Notes: Gastroesophageal reflux Genitourinary Medical History: Past Medical History Notes: Chronic kidney disease Musculoskeletal Medical History: Past Medical History Notes: Osteoarthritis of knee Psychiatric Medical History: Past Medical History Notes: Anxiety Immunizations Pneumococcal Vaccine: Received Pneumococcal Vaccination: No Implantable Devices No devices added Hospitalization / Surgery History Type of Hospitalization/Surgery Cellulitis left leg- 01/03/2022-01/07/2022 Electronic Signature(s) Signed: 03/24/2022 3:55:06 PM By: Geralyn Corwin DO Signed: 03/24/2022 4:21:29 PM By: Shawn Stall RN, BSN Entered By: Geralyn Corwin on 03/24/2022 15:08:49 -------------------------------------------------------------------------------- SuperBill Details Patient Name: Date of Service: [name] 03/24/2022 Medical Record Number: [id number] Patient Account Number: [id number] Date of Birth/Sex: Treating RN: 1951/02/19 (71 y.o. [name] Primary Care Provider: [name] Other Clinician: Referring Provider: Treating Provider/Extender: Annamary Rummage in Treatment: 17 Diagnosis Coding ICD-10 Codes Code Description [phone] Non-pressure chronic ulcer of other part of left lower leg with fat layer exposed T79.8XXA Other early complications of trauma, initial encounter I48.0 Paroxysmal atrial fibrillation Z79.01 Long term (current) use of anticoagulants Physician Procedures : CPT4 Code Description Modifier [id number] - WC PHYS LEVEL 3 - EST PT ICD-10 Diagnosis Description L97.822 Non-pressure chronic ulcer of other part of left lower leg with fat layer exposed T79.8XXA Other early complications of trauma, initial  encounter I48.0 Paroxysmal atrial fibrillation Z79.01 Long term (current) use of anticoagulants Quantity: 1 Electronic Signature(s) Signed: 03/24/2022 3:55:06 PM By: Geralyn Corwin DO Entered By: Geralyn Corwin on 03/24/2022 15:11:13

## 2022-03-24 NOTE — Progress Notes (Addendum)
KITZIA, CAMUS ([id number]) Visit Report for 03/24/2022 Arrival Information Details Patient Name: Date of Service: [name] 03/24/2022 2:15 PM Medical Record Number: [id number] Patient Account Number: [id number] Date of Birth/Sex: Treating RN: May 07, 1951 (71 y.o. [name], Meta.Reding Primary Care Jalie Eiland: Deland Pretty Other Clinician: Referring Sahory Nordling: Treating Braylei Totino/Extender: Judie Grieve in Treatment: 3 Visit Information History Since Last Visit Added or deleted any medications: No Patient Arrived: [name] Any new allergies or adverse reactions: No Arrival Time: 13:59 Had a fall or experienced change in No Accompanied By: son activities of daily living that may affect Transfer Assistance: None risk of falls: Patient Identification Verified: Yes Signs or symptoms of abuse/neglect since last visito No Secondary Verification Process Completed: Yes Hospitalized since last visit: No Patient Requires Transmission-Based Precautions: No Implantable device outside of the clinic excluding No Patient Has Alerts: Yes cellular tissue based products placed in the center Patient Alerts: Patient on Blood Thinner since last visit: Has Dressing in Place as Prescribed: Yes Pain Present Now: No Electronic Signature(s) Signed: 03/24/2022 4:49:08 PM By: Erenest Blank Entered By: Erenest Blank on 03/24/2022 14:00:14 -------------------------------------------------------------------------------- Clinic Level of Care Assessment Details Patient Name: Date of Service: [name] 03/24/2022 2:15 PM Medical Record Number: [id number] Patient Account Number: [id number] Date of Birth/Sex: Treating RN: [birth year] (71 y.o. [name], Meta.Reding Primary Care Florentina Marquart: Deland Pretty Other Clinician: Referring Chauntay Paszkiewicz: Treating Xander Jutras/Extender: Judie Grieve in Treatment: 17 Clinic Level of Care Assessment Items TOOL 4 Quantity  Score X- 1 0 Use when only an EandM is performed on FOLLOW-UP visit ASSESSMENTS - Nursing Assessment / Reassessment X- 1 10 Reassessment of Co-morbidities (includes updates in patient status) X- 1 5 Reassessment of Adherence to Treatment Plan ASSESSMENTS - Wound and Skin A ssessment / Reassessment X - Simple Wound Assessment / Reassessment - one wound 1 5 []  - 0 Complex Wound Assessment / Reassessment - multiple wounds X- 1 10 Dermatologic / Skin Assessment (not related to wound area) ASSESSMENTS - Focused Assessment X- 1 5 Circumferential Edema Measurements - multi extremities []  - 0 Nutritional Assessment / Counseling / Intervention []  - 0 Lower Extremity Assessment (monofilament, tuning fork, pulses) []  - 0 Peripheral Arterial Disease Assessment (using hand held doppler) ASSESSMENTS - Ostomy and/or Continence Assessment and Care []  - 0 Incontinence Assessment and Management []  - 0 Ostomy Care Assessment and Management (repouching, etc.) PROCESS - Coordination of Care X - Simple Patient / Family Education for ongoing care 1 15 []  - 0 Complex (extensive) Patient / Family Education for ongoing care X- 1 10 Staff obtains Programmer, systems, Records, T Results / Process Orders est []  - 0 Staff telephones HHA, Nursing Homes / Clarify orders / etc []  - 0 Routine Transfer to another Facility (non-emergent condition) []  - 0 Routine Hospital Admission (non-emergent condition) []  - 0 New Admissions / Biomedical engineer / Ordering NPWT Apligraf, etc. , []  - 0 Emergency Hospital Admission (emergent condition) X- 1 10 Simple Discharge Coordination []  - 0 Complex (extensive) Discharge Coordination PROCESS - Special Needs []  - 0 Pediatric / Minor Patient Management []  - 0 Isolation Patient Management []  - 0 Hearing / Language / Visual special needs []  - 0 Assessment of Community assistance (transportation, D/C planning, etc.) []  - 0 Additional assistance / Altered  mentation []  - 0 Support Surface(s) Assessment (bed, cushion, seat, etc.) INTERVENTIONS - Wound Cleansing / Measurement X - Simple Wound Cleansing - one wound 1 5 []  - 0  Complex Wound Cleansing - multiple wounds X- 1 5 Wound Imaging (photographs - any number of wounds) []  - 0 Wound Tracing (instead of photographs) X- 1 5 Simple Wound Measurement - one wound []  - 0 Complex Wound Measurement - multiple wounds INTERVENTIONS - Wound Dressings []  - 0 Small Wound Dressing one or multiple wounds X- 1 15 Medium Wound Dressing one or multiple wounds []  - 0 Large Wound Dressing one or multiple wounds []  - 0 Application of Medications - topical []  - 0 Application of Medications - injection INTERVENTIONS - Miscellaneous []  - 0 External ear exam []  - 0 Specimen Collection (cultures, biopsies, blood, body fluids, etc.) []  - 0 Specimen(s) / Culture(s) sent or taken to Lab for analysis []  - 0 Patient Transfer (multiple staff / Civil Service fast streamer / Similar devices) []  - 0 Simple Staple / Suture removal (25 or less) []  - 0 Complex Staple / Suture removal (26 or more) []  - 0 Hypo / Hyperglycemic Management (close monitor of Blood Glucose) []  - 0 Ankle / Brachial Index (ABI) - do not check if billed separately X- 1 5 Vital Signs Has the patient been seen at the hospital within the last three years: Yes Total Score: 105 Level Of Care: New/Established - Level 3 Electronic Signature(s) Signed: 03/29/2022 5:29:00 PM By: Deon Pilling RN, BSN Entered By: Deon Pilling on 03/29/2022 16:52:19 -------------------------------------------------------------------------------- Lower Extremity Assessment Details Patient Name: Date of Service: [name]. 03/24/2022 2:15 PM Medical Record Number: [id number] Patient Account Number: [id number] Date of Birth/Sex: Treating RN: 02-Nov-1950 (71 y.o. [name] Primary Care Eliya Bubar: Deland Pretty Other Clinician: Referring Bayani Renteria: Treating  Tieara Flitton/Extender: Judie Grieve in Treatment: 17 Edema Assessment Assessed: [name]: No] [name]: No] Edema: [Left: Ye] [Right: s] Calf Left: Right: Point of Measurement: 31 cm From Medial Instep 47 cm Ankle Left: Right: Point of Measurement: 9 cm From Medial Instep 25.8 cm Electronic Signature(s) Signed: 03/24/2022 4:21:29 PM By: Deon Pilling RN, BSN Signed: 03/24/2022 4:49:08 PM By: Erenest Blank Entered By: Erenest Blank on 03/24/2022 14:14:15 -------------------------------------------------------------------------------- Multi Wound Chart Details Patient Name: Date of Service: [name]. 03/24/2022 2:15 PM Medical Record Number: [id number] Patient Account Number: [id number] Date of Birth/Sex: Treating RN: 11/14/1950 (71 y.o. [name] Primary Care Nyoka Alcoser: Deland Pretty Other Clinician: Referring Kameryn Tisdel: Treating Aristides Luckey/Extender: Judie Grieve in Treatment: 17 Vital Signs Height(in): 67 Pulse(bpm): 60 Weight(lbs): 340 Blood Pressure(mmHg): 148/77 Body Mass Index(BMI): 53.2 Temperature(F): 98.6 Respiratory Rate(breaths/min): 18 Photos: Photos: [N/A:N/A] Left, Lateral Lower Leg N/A N/A Wound Location: Trauma N/A N/A Wounding Event: Trauma, Other N/A N/A Primary Etiology: Congestive Heart Failure, N/A N/A Comorbid History: Hypertension 10/18/2021 N/A N/A Date Acquired: 17 N/A N/A Weeks of Treatment: Open N/A N/A Wound Status: No N/A N/A Wound Recurrence: Yes N/A N/A Clustered Wound: 1 N/A N/A Clustered Quantity: 2x1.6x1.6 N/A N/A Measurements L x W x D (cm) 2.513 N/A N/A A (cm) : rea 4.021 N/A N/A Volume (cm) : 98.70% N/A N/A % Reduction in A rea: 99.80% N/A N/A % Reduction in Volume: 11 Position 1 (o'clock): 4.2 Maximum Distance 1 (cm): Yes N/A N/A Tunneling: Full Thickness Without Exposed N/A N/A Classification: Support Structures Medium N/A N/A Exudate  Amount: Serosanguineous N/A N/A Exudate Type: red, brown N/A N/A Exudate Color: Distinct, outline attached N/A N/A Wound Margin: Large (67-100%) N/A N/A Granulation Amount: Red N/A N/A Granulation Quality: None Present (0%) N/A N/A Necrotic Amount: Fat Layer (Subcutaneous Tissue): Yes N/A  N/A Exposed Structures: Fascia: No Tendon: No Muscle: No Joint: No Bone: No Small (1-33%) N/A N/A Epithelialization: Treatment Notes Electronic Signature(s) Signed: 03/24/2022 3:55:06 PM By: Kalman Shan DO Signed: 03/24/2022 4:21:29 PM By: Deon Pilling RN, BSN Entered By: Kalman Shan on 03/24/2022 15:07:11 -------------------------------------------------------------------------------- Multi-Disciplinary Care Plan Details Patient Name: Date of Service: [name]. 03/24/2022 2:15 PM Medical Record Number: [id number] Patient Account Number: [id number] Date of Birth/Sex: Treating RN: 1950/08/26 (71 y.o. [name], Meta.Reding Primary Care Myles Mallicoat: Deland Pretty Other Clinician: Referring Saniyah Mondesir: Treating Kess Mcilwain/Extender: Judie Grieve in Treatment: 17 Active Inactive Abuse / Safety / Falls / Self Care Management Nursing Diagnoses: History of Falls Potential for falls Goals: Patient/caregiver will verbalize/demonstrate measure taken to improve self care Date Initiated: 11/19/2021 Target Resolution Date: 04/21/2022 Goal Status: Active Patient/caregiver will verbalize/demonstrate measures taken to prevent injury and/or falls Date Initiated: 11/19/2021 Target Resolution Date: 04/21/2022 Goal Status: Active Interventions: Provide education on basic hygiene Provide education on fall prevention Provide education on HBO safety Provide education on vaccinations Notes: Pain, Acute or Chronic Nursing Diagnoses: Pain, acute or chronic: actual or potential Potential alteration in comfort, pain Goals: Patient will verbalize adequate pain control and  receive pain control interventions during procedures as needed Date Initiated: 11/19/2021 Target Resolution Date: 04/22/2022 Goal Status: Active Patient/caregiver will verbalize comfort level met Date Initiated: 11/19/2021 Target Resolution Date: 04/22/2022 Goal Status: Active Interventions: Encourage patient to take pain medications as prescribed Provide education on pain management Reposition patient for comfort Treatment Activities: Administer pain control measures as ordered : 11/19/2021 Notes: Electronic Signature(s) Signed: 03/24/2022 4:21:29 PM By: Deon Pilling RN, BSN Entered By: Deon Pilling on 03/24/2022 14:34:26 -------------------------------------------------------------------------------- Pain Assessment Details Patient Name: Date of Service: [name]. 03/24/2022 2:15 PM Medical Record Number: [id number] Patient Account Number: [id number] Date of Birth/Sex: Treating RN: 10-19-1950 (71 y.o. [name] Primary Care Veryl Winemiller: Deland Pretty Other Clinician: Referring Kemet Nijjar: Treating Audriella Blakeley/Extender: Judie Grieve in Treatment: 17 Active Problems Location of Pain Severity and Description of Pain Patient Has Paino No Site Locations Pain Management and Medication Current Pain Management: Electronic Signature(s) Signed: 03/24/2022 4:21:29 PM By: Deon Pilling RN, BSN Signed: 03/24/2022 4:49:08 PM By: Erenest Blank Entered By: Erenest Blank on 03/24/2022 14:02:24 -------------------------------------------------------------------------------- Patient/Caregiver Education Details Patient Name: Date of Service: [name] 8/3/2023andnbsp2:15 PM Medical Record Number: [id number] Patient Account Number: [id number] Date of Birth/Gender: Treating RN: March 10, 1951 (71 y.o. [name] Primary Care Physician: Deland Pretty Other Clinician: Referring Physician: Treating Physician/Extender: Judie Grieve in Treatment: 17 Education Assessment Education Provided To: Patient Education Topics Provided Safety: Handouts: Medication Safety Methods: Explain/Verbal Responses: Reinforcements needed Engineer, maintenance) Signed: 03/24/2022 4:21:29 PM By: Deon Pilling RN, BSN Entered By: Deon Pilling on 03/24/2022 14:34:35 -------------------------------------------------------------------------------- Wound Assessment Details Patient Name: Date of Service: [name]. 03/24/2022 2:15 PM Medical Record Number: [id number] Patient Account Number: [id number] Date of Birth/Sex: Treating RN: [birth year] (71 y.o. [name], Meta.Reding Primary Care Mann Skaggs: Deland Pretty Other Clinician: Referring Latresa Gasser: Treating Raiyan Dalesandro/Extender: Judie Grieve in Treatment: 17 Wound Status Wound Number: 1 Primary Etiology: Trauma, Other Wound Location: Left, Lateral Lower Leg Wound Status: Open Wounding Event: Trauma Comorbid History: Congestive Heart Failure, Hypertension Date Acquired: 10/18/2021 Weeks Of Treatment: 17 Clustered Wound: Yes Photos Wound Measurements Length: (cm) 2 Width: (cm) 1.6 Depth: (cm) 1.6 Clustered Quantity: 1 Area: (cm) 2.513 Volume: (cm) 4.021 % Reduction in  Area: 98.7% % Reduction in Volume: 99.8% Epithelialization: Small (1-33%) Tunneling: Yes Position (o'clock): 11 Maximum Distance: (cm) 4.2 Undermining: No Wound Description Classification: Full Thickness Without Exposed Support Structures Wound Margin: Distinct, outline attached Exudate Amount: Medium Exudate Type: Serosanguineous Exudate Color: red, brown Foul Odor After Cleansing: No Slough/Fibrino No Wound Bed Granulation Amount: Large (67-100%) Exposed Structure Granulation Quality: Red Fascia Exposed: No Necrotic Amount: None Present (0%) Fat Layer (Subcutaneous Tissue) Exposed: Yes Tendon Exposed: No Muscle Exposed: No Joint Exposed: No Bone Exposed:  No Electronic Signature(s) Signed: 03/24/2022 4:21:29 PM By: Deon Pilling RN, BSN Signed: 03/24/2022 4:49:08 PM By: Erenest Blank Entered By: Erenest Blank on 03/24/2022 14:16:18 -------------------------------------------------------------------------------- Vitals Details Patient Name: Date of Service: [name]. 03/24/2022 2:15 PM Medical Record Number: [id number] Patient Account Number: [id number] Date of Birth/Sex: Treating RN: 05-May-1951 (71 y.o. [name], Meta.Reding Primary Care Jadelin Eng: Deland Pretty Other Clinician: Referring Lyndell Allaire: Treating Elad Macphail/Extender: Judie Grieve in Treatment: 17 Vital Signs Time Taken: 14:02 Temperature (F): 98.6 Height (in): 67 Pulse (bpm): 60 Weight (lbs): 340 Respiratory Rate (breaths/min): 18 Body Mass Index (BMI): 53.2 Blood Pressure (mmHg): 148/77 Reference Range: 80 - 120 mg / dl Electronic Signature(s) Signed: 03/24/2022 4:49:08 PM By: Erenest Blank Entered By: Erenest Blank on 03/24/2022 14:02:19

## 2022-03-28 DIAGNOSIS — L97822 Non-pressure chronic ulcer of other part of left lower leg with fat layer exposed: Secondary | ICD-10-CM | POA: Diagnosis not present

## 2022-03-31 ENCOUNTER — Encounter (HOSPITAL_BASED_OUTPATIENT_CLINIC_OR_DEPARTMENT_OTHER): Payer: PPO | Admitting: Internal Medicine

## 2022-03-31 DIAGNOSIS — I480 Paroxysmal atrial fibrillation: Secondary | ICD-10-CM

## 2022-03-31 DIAGNOSIS — Z7901 Long term (current) use of anticoagulants: Secondary | ICD-10-CM | POA: Diagnosis not present

## 2022-03-31 DIAGNOSIS — L97822 Non-pressure chronic ulcer of other part of left lower leg with fat layer exposed: Secondary | ICD-10-CM | POA: Diagnosis not present

## 2022-03-31 DIAGNOSIS — T798XXA Other early complications of trauma, initial encounter: Secondary | ICD-10-CM | POA: Diagnosis not present

## 2022-03-31 NOTE — Progress Notes (Addendum)
DELYLAH, [name] ([id number]) Visit Report for 03/31/2022 Arrival Information Details Patient Name: Date of Service: [name] 03/31/2022 3:00 PM Medical Record Number: [id number] Patient Account Number: [id number] Date of Birth/Sex: Treating RN: 1950/12/07 (71 y.o. [name], Meta.Reding Primary Care Earnesteen Birnie: Deland Pretty Other Clinician: Referring Kidus Delman: Treating Alanya Vukelich/Extender: Judie Grieve in Treatment: 18 Visit Information History Since Last Visit Added or deleted any medications: No Patient Arrived: [name] Any new allergies or adverse reactions: No Arrival Time: 15:20 Had a fall or experienced change in No Accompanied By: self activities of daily living that may affect Transfer Assistance: None risk of falls: Patient Identification Verified: Yes Signs or symptoms of abuse/neglect since last visito No Secondary Verification Process Completed: Yes Hospitalized since last visit: No Patient Requires Transmission-Based Precautions: No Implantable device outside of the clinic excluding No Patient Has Alerts: Yes cellular tissue based products placed in the center Patient Alerts: Patient on Blood Thinner since last visit: Has Dressing in Place as Prescribed: No Pain Present Now: No Notes per patient periwound was red and irritated from drape of wound vac applied previous order of Dakin's wet to dry yesterday. wound vac on from Monday till yesterday. Shemicka Cohrs made aware. Electronic Signature(s) Signed: 03/31/2022 5:06:20 PM By: Deon Pilling RN, BSN Entered By: Deon Pilling on 03/31/2022 15:29:41 -------------------------------------------------------------------------------- Clinic Level of Care Assessment Details Patient Name: Date of Service: [name]. 03/31/2022 3:00 PM Medical Record Number: [id number] Patient Account Number: [id number] Date of Birth/Sex: Treating RN: Nov 19, 1950 (71 y.o. [name], Meta.Reding Primary Care  Margert Edsall: Deland Pretty Other Clinician: Referring Corry Storie: Treating Josslin Sanjuan/Extender: Judie Grieve in Treatment: 18 Clinic Level of Care Assessment Items TOOL 4 Quantity Score X- 1 0 Use when only an EandM is performed on FOLLOW-UP visit ASSESSMENTS - Nursing Assessment / Reassessment X- 1 10 Reassessment of Co-morbidities (includes updates in patient status) X- 1 5 Reassessment of Adherence to Treatment Plan ASSESSMENTS - Wound and Skin A ssessment / Reassessment X - Simple Wound Assessment / Reassessment - one wound 1 5 _0  - 0 Complex Wound Assessment / Reassessment - multiple wounds X- 1 10 Dermatologic / Skin Assessment (not related to wound area) ASSESSMENTS - Focused Assessment _1  - 0 Circumferential Edema Measurements - multi extremities _2  - 0 Nutritional Assessment / Counseling / Intervention _3  - 0 Lower Extremity Assessment (monofilament, tuning fork, pulses) _4  - 0 Peripheral Arterial Disease Assessment (using hand held doppler) ASSESSMENTS - Ostomy and/or Continence Assessment and Care _5  - 0 Incontinence Assessment and Management _6  - 0 Ostomy Care Assessment and Management (repouching, etc.) PROCESS - Coordination of Care X - Simple Patient / Family Education for ongoing care 1 15 _7  - 0 Complex (extensive) Patient / Family Education for ongoing care X- 1 10 Staff obtains Programmer, systems, Records, T Results / Process Orders est _8  - 0 Staff telephones HHA, Nursing Homes / Clarify orders / etc _9  - 0 Routine Transfer to another Facility (non-emergent condition) _10  - 0 Routine Hospital Admission (non-emergent condition) _11  - 0 New Admissions / Biomedical engineer / Ordering NPWT Apligraf, etc. , _12  - 0 Emergency Hospital Admission (emergent condition) X- 1 10 Simple Discharge Coordination _13  - 0 Complex (extensive) Discharge Coordination PROCESS - Special Needs _14  - 0 Pediatric / Minor Patient Management _15  -  0 Isolation Patient Management _16  - 0 Hearing / Language / Visual special needs _17  - 0 Assessment of Community assistance (transportation, D/C planning, etc.) _18  - 0  Additional assistance / Altered mentation _0  - 0 Support Surface(s) Assessment (bed, cushion, seat, etc.) INTERVENTIONS - Wound Cleansing / Measurement X - Simple Wound Cleansing - one wound 1 5 _1  - 0 Complex Wound Cleansing - multiple wounds X- 1 5 Wound Imaging (photographs - any number of wounds) _2  - 0 Wound Tracing (instead of photographs) X- 1 5 Simple Wound Measurement - one wound _3  - 0 Complex Wound Measurement - multiple wounds INTERVENTIONS - Wound Dressings _4  - 0 Small Wound Dressing one or multiple wounds X- 1 15 Medium Wound Dressing one or multiple wounds _5  - 0 Large Wound Dressing one or multiple wounds X- 1 5 Application of Medications - topical <[id number]>_3<\/ATFTDDUKGURKYHCW>_2  - 0 Application of Medications - injection INTERVENTIONS - Miscellaneous _7  - 0 External ear exam _8  - 0 Specimen Collection (cultures, biopsies, blood, body fluids, etc.) _9  - 0 Specimen(s) / Culture(s) sent or taken to Lab for analysis _10  - 0 Patient Transfer (multiple staff / Civil Service fast streamer / Similar devices) _11  - 0 Simple Staple / Suture removal (25 or less) _12  - 0 Complex Staple / Suture removal (26 or more) _13  - 0 Hypo / Hyperglycemic Management (close monitor of Blood Glucose) _14  - 0 Ankle / Brachial Index (ABI) - do not check if billed separately X- 1 5 Vital Signs Has the patient been seen at the hospital within the last three years: Yes Total Score: 105 Level Of Care: New/Established - Level 3 Electronic Signature(s) Signed: 03/31/2022 5:06:20 PM By: Deon Pilling RN, BSN Entered By: Deon Pilling on 03/31/2022 16:00:23 -------------------------------------------------------------------------------- Encounter Discharge Information Details Patient Name: Date of Service: [name]. 03/31/2022 3:00 PM Medical Record  Number: [id number] Patient Account Number: [id number] Date of Birth/Sex: Treating RN: [birth year] (71 y.o. [name] Primary Care Arrayah Connors: Deland Pretty Other Clinician: Referring Ugo Thoma: Treating Dandrea Medders/Extender: Judie Grieve in Treatment: 18 Encounter Discharge Information Items Discharge Condition: Stable Ambulatory Status: Walker Discharge Destination: Home Transportation: Private Auto Accompanied By: self Schedule Follow-up Appointment: Yes Clinical Summary of Care: Electronic Signature(s) Signed: 03/31/2022 5:06:20 PM By: Deon Pilling RN, BSN Entered By: Deon Pilling on 03/31/2022 16:00:49 -------------------------------------------------------------------------------- Lower Extremity Assessment Details Patient Name: Date of Service: [name]. 03/31/2022 3:00 PM Medical Record Number: [id number] Patient Account Number: [id number] Date of Birth/Sex: Treating RN: 06-10-1951 (71 y.o. [name] Primary Care Brittnae Aschenbrenner: Deland Pretty Other Clinician: Referring Jaise Moser: Treating Keighan Amezcua/Extender: Judie Grieve in Treatment: 18 Edema Assessment Assessed: [name]: Yes] [name]: No] Edema: [Left: Ye] [Right: s] Calf Left: Right: Point of Measurement: 31 cm From Medial Instep 47 cm Ankle Left: Right: Point of Measurement: 9 cm From Medial Instep 25 cm Vascular Assessment Pulses: Dorsalis Pedis Palpable: [Left:Yes] Electronic Signature(s) Signed: 03/31/2022 5:06:20 PM By: Deon Pilling RN, BSN Entered By: Deon Pilling on 03/31/2022 15:30:42 -------------------------------------------------------------------------------- Multi Wound Chart Details Patient Name: Date of Service: [name]. 03/31/2022 3:00 PM Medical Record Number: [id number] Patient Account Number: [id number] Date of Birth/Sex: Treating RN: May 01, 1951 (71 y.o. [name] Primary Care Teancum Brule: Deland Pretty Other  Clinician: Referring Jerusalen Mateja: Treating Jaksen Fiorella/Extender: Judie Grieve in Treatment: 18 Vital Signs Height(in): 67 Pulse(bpm): 87 Weight(lbs): 340 Blood Pressure(mmHg): 184/78 Body Mass Index(BMI): 53.2 Temperature(F): 98 Respiratory Rate(breaths/min): 16 Photos: [N/A:N/A] Left, Lateral Lower Leg N/A N/A Wound Location: Trauma N/A N/A Wounding Event: Trauma, Other N/A N/A Primary Etiology: Congestive Heart Failure, N/A N/A Comorbid History: Hypertension 10/18/2021 N/A N/A Date  Acquired: 18 N/A N/A Weeks of Treatment: Open N/A N/A Wound Status: No N/A N/A Wound Recurrence: Yes N/A N/A Clustered Wound: 1 N/A N/A Clustered Quantity: 0.8x0.9x1.8 N/A N/A Measurements L x W x D (cm) 0.565 N/A N/A A (cm) : rea 1.018 N/A N/A Volume (cm) : 99.70% N/A N/A % Reduction in A rea: 99.90% N/A N/A % Reduction in Volume: 11 Position 1 (o'clock): 4.4 Maximum Distance 1 (cm): Yes N/A N/A Tunneling: Full Thickness Without Exposed N/A N/A Classification: Support Structures Medium N/A N/A Exudate Amount: Serosanguineous N/A N/A Exudate Type: red, brown N/A N/A Exudate Color: Distinct, outline attached N/A N/A Wound Margin: Large (67-100%) N/A N/A Granulation Amount: Red N/A N/A Granulation Quality: None Present (0%) N/A N/A Necrotic Amount: Fat Layer (Subcutaneous Tissue): Yes N/A N/A Exposed Structures: Fascia: No Tendon: No Muscle: No Joint: No Bone: No Small (1-33%) N/A N/A Epithelialization: Treatment Notes Wound #1 (Lower Leg) Wound Laterality: Left, Lateral Cleanser Wound Cleanser Discharge Instruction: Cleanse the wound with wound cleanser prior to applying a clean dressing using gauze sponges, not tissue or cotton balls. Peri-Wound Care Triamcinolone 15 (g) Discharge Instruction: Use triamcinolone apply only in clinic today with lotion to periwound. Sween Lotion (Moisturizing lotion) Discharge Instruction: Apply  moisturizing lotion to left leg with dressing. Topical Primary Dressing Dakin's Solution 0.25%, 16 (oz) Discharge Instruction: Moisten gauze with Dakin's in clinic only. Secondary Dressing ABD Pad, 5x9 Discharge Instruction: Apply over primary dressing in clinic only. Secured With [employer], 4.5x3.1 (in/yd) Discharge Instruction: Secure with Kerlix in clinic. ace wrap Discharge Instruction: only in clinic. Compression Wrap Compression Stockings Add-Ons Electronic Signature(s) Signed: 04/01/2022 1:41:40 PM By: Kalman Shan DO Signed: 04/01/2022 5:06:10 PM By: Deon Pilling RN, BSN Entered By: Kalman Shan on 04/01/2022 13:22:44 -------------------------------------------------------------------------------- Multi-Disciplinary Care Plan Details Patient Name: Date of Service: [name], Christine Blinks LYN [name]. 03/31/2022 3:00 PM Medical Record Number: [id number] Patient Account Number: [id number] Date of Birth/Sex: Treating RN: Nov 04, 1950 (71 y.o. [name], Meta.Reding Primary Care Milcah Dulany: Deland Pretty Other Clinician: Referring Janeliz Prestwood: Treating Moriya Mitchell/Extender: Judie Grieve in Treatment: 24 Active Inactive Abuse / Safety / Falls / Self Care Management Nursing Diagnoses: History of Falls Potential for falls Goals: Patient/caregiver will verbalize/demonstrate measure taken to improve self care Date Initiated: 11/19/2021 Target Resolution Date: 04/21/2022 Goal Status: Active Patient/caregiver will verbalize/demonstrate measures taken to prevent injury and/or falls Date Initiated: 11/19/2021 Target Resolution Date: 04/21/2022 Goal Status: Active Interventions: Provide education on basic hygiene Provide education on fall prevention Provide education on HBO safety Provide education on vaccinations Notes: Pain, Acute or Chronic Nursing Diagnoses: Pain, acute or chronic: actual or potential Potential alteration in comfort, pain Goals: Patient  will verbalize adequate pain control and receive pain control interventions during procedures as needed Date Initiated: 11/19/2021 Target Resolution Date: 04/22/2022 Goal Status: Active Patient/caregiver will verbalize comfort level met Date Initiated: 11/19/2021 Target Resolution Date: 04/22/2022 Goal Status: Active Interventions: Encourage patient to take pain medications as prescribed Provide education on pain management Reposition patient for comfort Treatment Activities: Administer pain control measures as ordered : 11/19/2021 Notes: Electronic Signature(s) Signed: 03/31/2022 5:06:20 PM By: Deon Pilling RN, BSN Entered By: Deon Pilling on 03/31/2022 15:38:35 -------------------------------------------------------------------------------- Pain Assessment Details Patient Name: Date of Service: [name]. 03/31/2022 3:00 PM Medical Record Number: [id number] Patient Account Number: [id number] Date of Birth/Sex: Treating RN: 10/24/1950 (71 y.o. [name] Primary Care Milany Geck: Deland Pretty Other Clinician: Referring Johnrobert Foti: Treating Yves Fodor/Extender: Judie Grieve in  Treatment: 18 Active Problems Location of Pain Severity and Description of Pain Patient Has Paino No Site Locations Rate the pain. Rate the pain. Current Pain Level: 0 Pain Management and Medication Current Pain Management: Medication: No Cold Application: No Rest: No Massage: No Activity: No T.E.N.S.: No Heat Application: No Leg drop or elevation: No Is the Current Pain Management Adequate: Adequate How does your wound impact your activities of daily livingo Sleep: No Bathing: No Appetite: No Relationship With Others: No Bladder Continence: No Emotions: No Bowel Continence: No Work: No Toileting: No Drive: No Dressing: No Hobbies: No Engineer, maintenance) Signed: 03/31/2022 5:06:20 PM By: Deon Pilling RN, BSN Entered By: Deon Pilling on 03/31/2022  15:30:08 -------------------------------------------------------------------------------- Patient/Caregiver Education Details Patient Name: Date of Service: [name] 8/10/2023andnbsp3:00 PM Medical Record Number: [id number] Patient Account Number: [id number] Date of Birth/Gender: Treating RN: 07-Feb-1951 (71 y.o. [name] Primary Care Physician: Deland Pretty Other Clinician: Referring Physician: Treating Physician/Extender: Judie Grieve in Treatment: 18 Education Assessment Education Provided To: Patient Education Topics Provided Wound/Skin Impairment: Handouts: Skin Care Do's and Dont's Methods: Explain/Verbal Responses: Reinforcements needed Electronic Signature(s) Signed: 03/31/2022 5:06:20 PM By: Deon Pilling RN, BSN Entered By: Deon Pilling on 03/31/2022 15:38:48 -------------------------------------------------------------------------------- Wound Assessment Details Patient Name: Date of Service: [name]. 03/31/2022 3:00 PM Medical Record Number: [id number] Patient Account Number: [id number] Date of Birth/Sex: Treating RN: August 07, 1951 (71 y.o. [name], Meta.Reding Primary Care Alan Drummer: Deland Pretty Other Clinician: Referring Kensleigh Gates: Treating Yareli Carthen/Extender: Judie Grieve in Treatment: 18 Wound Status Wound Number: 1 Primary Etiology: Trauma, Other Wound Location: Left, Lateral Lower Leg Wound Status: Open Wounding Event: Trauma Comorbid History: Congestive Heart Failure, Hypertension Date Acquired: 10/18/2021 Weeks Of Treatment: 18 Clustered Wound: Yes Photos Wound Measurements Length: (cm) 0.8 Width: (cm) 0.9 Depth: (cm) 1.8 Clustered Quantity: 1 Area: (cm) 0.565 Volume: (cm) 1.018 % Reduction in Area: 99.7% % Reduction in Volume: 99.9% Epithelialization: Small (1-33%) Tunneling: Yes Position (o'clock): 11 Maximum Distance: (cm) 4.4 Undermining: No Wound  Description Classification: Full Thickness Without Exposed Support Structures Wound Margin: Distinct, outline attached Exudate Amount: Medium Exudate Type: Serosanguineous Exudate Color: red, brown Foul Odor After Cleansing: No Slough/Fibrino No Wound Bed Granulation Amount: Large (67-100%) Exposed Structure Granulation Quality: Red Fascia Exposed: No Necrotic Amount: None Present (0%) Fat Layer (Subcutaneous Tissue) Exposed: Yes Tendon Exposed: No Muscle Exposed: No Joint Exposed: No Bone Exposed: No Treatment Notes Wound #1 (Lower Leg) Wound Laterality: Left, Lateral Cleanser Wound Cleanser Discharge Instruction: Cleanse the wound with wound cleanser prior to applying a clean dressing using gauze sponges, not tissue or cotton balls. Peri-Wound Care Triamcinolone 15 (g) Discharge Instruction: Use triamcinolone apply only in clinic today with lotion to periwound. Sween Lotion (Moisturizing lotion) Discharge Instruction: Apply moisturizing lotion to left leg with dressing. Topical Primary Dressing Dakin's Solution 0.25%, 16 (oz) Discharge Instruction: Moisten gauze with Dakin's in clinic only. Secondary Dressing ABD Pad, 5x9 Discharge Instruction: Apply over primary dressing in clinic only. Secured With [employer], 4.5x3.1 (in/yd) Discharge Instruction: Secure with Kerlix in clinic. ace wrap Discharge Instruction: only in clinic. Compression Wrap Compression Stockings Add-Ons Electronic Signature(s) Signed: 03/31/2022 5:06:20 PM By: Deon Pilling RN, BSN Entered By: Deon Pilling on 03/31/2022 15:34:28 -------------------------------------------------------------------------------- Vitals Details Patient Name: Date of Service: [name], [name]. 03/31/2022 3:00 PM Medical Record Number: [id number] Patient Account Number: [id number] Date of Birth/Sex: Treating RN: 04-16-1951 (71 y.o. [name] Primary Care Treasure Ochs:  Deland Pretty Other  Clinician: Referring Akirra Lacerda: Treating Tinya Cadogan/Extender: Judie Grieve in Treatment: 18 Vital Signs Time Taken: 15:20 Temperature (F): 98 Height (in): 67 Pulse (bpm): 66 Weight (lbs): 340 Respiratory Rate (breaths/min): 16 Body Mass Index (BMI): 53.2 Blood Pressure (mmHg): 184/78 Reference Range: 80 - 120 mg / dl Electronic Signature(s) Signed: 03/31/2022 5:06:20 PM By: Deon Pilling RN, BSN Entered By: Deon Pilling on 03/31/2022 15:30:00

## 2022-04-01 NOTE — Progress Notes (Signed)
Christine SleetORRIS, Tashiba J. ([id number]) Visit Report for 03/31/2022 Chief Complaint Document Details Patient Name: Date of Service: Gwynneth MunsonO RRIS, [name] 03/31/2022 3:00 PM Medical Record Number: [id number] Patient Account Number: [id number] Date of Birth/Sex: Treating RN: 06/02/1951 (71 y.o. Arta SilenceF) Deaton, Bobbi Primary Care Provider: Merri BrunettePharr, Walter Other Clinician: Referring Provider: Treating Provider/Extender: Annamary RummageHoffman, [place] Kretz Pharr, Walter Weeks in Treatment: 18 Information Obtained from: Patient Chief Complaint 11/19/2021; Left lower extremity wound status post fall Electronic Signature(s) Signed: 04/01/2022 1:41:40 PM By: Geralyn CorwinHoffman, Shawon Denzer DO Entered By: Geralyn CorwinHoffman, Annett Boxwell on 04/01/2022 13:22:50 -------------------------------------------------------------------------------- HPI Details Patient Name: Date of Service: [name], [name] 03/31/2022 3:00 PM Medical Record Number: [id number] Patient Account Number: [id number] Date of Birth/Sex: Treating RN: 07/02/1951 (71 y.o. Arta SilenceF) Deaton, Bobbi Primary Care Provider: Merri BrunettePharr, Walter Other Clinician: Referring Provider: Treating Provider/Extender: Annamary RummageHoffman, Laelyn Blumenthal Pharr, Walter Weeks in Treatment: 18 History of Present Illness HPI Description: Admission 11/19/2021 Ms. [name] is a 71 year old female with a past medical history of paroxysmal A-fib on Eliquis, hypothyroidism, major depressive disorder, venous insufficiency and chronic diastolic heart failure that presents to the clinic for a 1 month history of nonhealing wound to the left lower extremity. She visited the ED on 10/18/2021 after a mechanical fall. She developed a hematoma that subsequently opened. She was hospitalized for 7 days and discharged on 10/25/2021. She has been on several different antibiotics For the past month. She states that most recently she was on Levaquin and linezolid for the past week. She states she completes her antibiotic course tomorrow. She has been using  Dakin's wet-to-dry dressings to the wound bed. She denies signs of infection. 4/7; patient presents for follow-up. She has been using Dakin's wet-to-dry dressings. She did end up going to the ED on 4/1 because she had excess bleeding with dressing change that she could not stop. She is on Eliquis for A-fib. In the ED they tied off a small artery. She has had no issues since discharge. She denies signs of infection. 4/14; this is a very difficult clinical situation. A patient with underlying chronic venous insufficiency and lymphedema very significant lower extremity edema had a hematoma after a fall on her left upper lateral lower leg. She is on Eliquis for atrial fibrillation apparently with a history of a splenic infarct following with Dr. Berton MountSteve Klein of cardiology. She has exhibited significant bleeding from the wound surface including ao Venous bleeder that required suturing short while ago. She has been using Dakin's wet-to-dry packing and over the surface of the wound. She saw Dr. Graciela HusbandsKlein yesterday he is reluctant to consider stopping the Eliquis because of the prior history of presumed cardioembolism. Wants to communicate with Dr. Mikey BussingHoffman when she returns. In a perfect world where she was not on Eliquis she requires a wound VAC with additional compression wraps but I understand the reluctance to do this because of the concerns of bleeding 4/28; the patient's wound actually looks better today using Dakin's wet-to-dry that she is changing twice a day she is wrapping this with Kerlix and Ace wrapping. She tells me she had 2 small bleeding areas which were part of the superficial wound that stopped this week with direct pressure. Other than that no major issues. In follow-up from discussion of last week Dr. 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 The bigger question  from my point of view is the wound VAC issue. As far as she knows and her daughter-in-law verifies that she has not had any bleeding from the deeper parts of the wound although the bleeding has been superficial including the one that sent her to the ER for stitches. She is concerned that a wound VAC would cause further bleeding and I cannot completely allay those concerns. 5/8; patient presents for follow-up. She has no issues or complaints today. She has been using Dakin's wet-to-dry dressings without issues. She denies signs of infection. 5/12; patient presents for follow-up. She continues to use Dakin's wet-to-dry dressings without issues. She denies signs of infection. She reports some issues with bleeding at times but this has improved. She denies signs of infection. 6/1; patient presents for follow-up. She was recently hospitalized for upper left leg thigh cellulitis. She was given IV cefepime and vancomycin and discharged on oral antibiotics. She has been using Dakin's wet-to-dry dressings to the left lower leg wound. She reports improvement in healing. She currently denies systemic signs of infection. 6/7; patient is using Dakin's wet-to-dry twice daily. In general this looks better than when I saw this a month or so ago however still considerable depth to the tunnel in her left leg. She is going for iron infusions ordered by her primary care doctor I believe 6/15; patient presents for follow-up. She has been using Dakin's wet-to-dry dressings. She has noted some scattered small areas that blister up and heal on her left lower leg. She has been using mupirocin ointment on them. Nothing open today. 6/23; patient's been using Dakin's wet-to-dry dressings to the tunneled wound and Hydrofera Blue to the opening. She has no issues or complaints today. 6/29; patient presents for follow-up. She has been using Dakin's wet-to-dry dressings to the tunneled wound  and Hydrofera Blue to the opening. She states that [facility] is sticking to the wound bed. She denies signs of infection. 7/7; patient presents for follow-up. She has been using Dakin's wet-to-dry dressings to the tunneled wound and PolyMem silver to the opening without issues. 7/13; patient presents for follow-up. She has been using PolyMem silver to the opening and the rope to the tunnel. She has no issues or complaints today. She denies signs of infection. 7/20; patient presents for follow-up. We have been using PolyMem silver to the wound bed. She has no issues or complaints today. She is receiving her custom compression garments tomorrow. 7/27; patient presents for follow-up. She has been using PolyMem silver to the wound bed. She is having her custom compression garments adjusted as these are not staying on very easily. 8/30; patient presents for follow-up. We have been using PolyMem silver to the wound bed. She is still waiting on her custom compression garments to be ordered. She denies signs of infection. 8/11; patient presents for follow-up. The wound VAC has been started and patient has been using this for the past week. Patient has home health who changes the wound VAC. She developed irritation to the periwound. DuoDERM was not being used to the periwound despite being ordered. Electronic Signature(s) Signed: 04/01/2022 1:41:40 PM By: Geralyn Corwin DO Entered By: Geralyn Corwin on 04/01/2022 13:24:01 -------------------------------------------------------------------------------- Physical Exam Details Patient Name: Date of Service: Marthenia Rolling [name] 03/31/2022 3:00 PM Medical Record Number: [id number] Patient Account Number: [id number] Date of Birth/Sex: Treating RN: 1951/08/05 (71 y.o. [name] Primary Care Provider: [name] Other Clinician: Referring Provider: Treating Provider/Extender: Annamary Rummage in Treatment:  18 Constitutional respirations regular, non-labored and within target range for patient.. Cardiovascular 2+ dorsalis pedis/posterior tibialis pulses. Psychiatric pleasant and cooperative. Notes Left lower extremity: Open wound with granulation tissue to the opening and increased depth and tunneling within. No signs of surrounding infection. Electronic Signature(s) Signed: 04/01/2022 1:41:40 PM By: Geralyn Corwin DO Entered By: Geralyn Corwin on 04/01/2022 13:24:22 -------------------------------------------------------------------------------- Physician Orders Details Patient Name: Date of Service: [name], Romona Curls [name] 03/31/2022 3:00 PM Medical Record Number: [id number] Patient Account Number: [id number] Date of Birth/Sex: Treating RN: May 26, 1951 (71 y.o. [name] Primary Care Provider: [name] Other Clinician: Referring Provider: Treating Provider/Extender: Annamary Rummage in Treatment: 73 Verbal / Phone Orders: No Diagnosis Coding ICD-10 Coding Code Description [phone] Non-pressure chronic ulcer of other part of left lower leg with fat layer exposed T79.8XXA Other early complications of trauma, initial encounter I48.0 Paroxysmal atrial fibrillation Z79.01 Long term (current) use of anticoagulants Follow-up Appointments ppointment in 1 week. - Dr. Mikey Bussing and [place], Room 8 04/05/2022 1245pm Tuesday Return A ppointment in 2 weeks. - Dr. Mikey Bussing and [place], Room 8 04/14/2022 1245pm Thursday Return A Other: - Continue to wear compression stockings daily. Medical Modalities wound vac. Anesthetic (In clinic) Topical Lidocaine 4% applied to wound bed Bathing/ Shower/ Hygiene May shower with protection but do not get wound dressing(s) wet. Negative Presssure Wound Therapy Wound Vac to wound continuously at 144mm/hg pressure - home health to apply duoderm under drape to periwound where skin irritation is noted. Black and White Foam  combination - Lightly pack white foam into the tunnel and undermining. Home health change three times a week Monday, Wednesday, and Friday. Wound center will apply dakin's wet to dry in clinic. Edema Control - Lymphedema / SCD / Other Elevate legs to the level of the heart or above for 30 minutes daily and/or when sitting, a frequency of: - 3-4 times a day throughout the day. Avoid standing for long periods of time. Moisturize legs daily. - both legs every night before bed. Compression stocking or Garment 20-30 mm/Hg pressure to: - patient wear in the morning and remove at night. no ace wrap when wearing. Home Health New wound care orders this week; continue Home Health for wound care. May utilize formulary equivalent dressing for wound treatment orders unless otherwise specified. - White and black foam Lightly pack white foam into the tunnel and undermining. Home health change three times a week Monday, Wednesday, and Friday. Wound center will apply dakin's wet to dry in clinic. HOME HEALTH TO PLEASE APPLY duoderm to periwound for protection from the drape. Other Home Health Orders/Instructions: - [facility] Wound Treatment Wound #1 - Lower Leg Wound Laterality: Left, Lateral Cleanser: Wound Cleanser 1 x Per Day/30 Days Discharge Instructions: Cleanse the wound with wound cleanser prior to applying a clean dressing using gauze sponges, not tissue or cotton balls. Peri-Wound Care: Triamcinolone 15 (g) 1 x Per Day/30 Days Discharge Instructions: Use triamcinolone apply only in clinic today with lotion to periwound. Peri-Wound Care: Sween Lotion (Moisturizing lotion) 1 x Per Day/30 Days Discharge Instructions: Apply moisturizing lotion to left leg with dressing. Prim Dressing: Dakin's Solution 0.25%, 16 (oz) 1 x Per Day/30 Days ary Discharge Instructions: Moisten gauze with Dakin's in clinic only. Secondary Dressing: ABD Pad, 5x9 1 x Per Day/30 Days Discharge Instructions: Apply over  primary dressing in clinic only. Secured With: [employer], 4.5x3.1 (in/yd) 1 x Per Day/30 Days Discharge Instructions: Secure with Kerlix in clinic. Secured With:  ace wrap 1 x Per Day/30 Days Discharge Instructions: only in clinic. Patient Medications llergies: Augmentin A Notifications Medication Indication Start End lidocaine DOSE topical 5 % cream - cream topical in clinic prior debridement. Electronic Signature(s) Signed: 04/01/2022 1:41:40 PM By: Geralyn Corwin DO Previous Signature: 03/31/2022 5:06:20 PM Version By: Shawn Stall RN, BSN Entered By: Geralyn Corwin on 04/01/2022 13:24:33 Prescription 03/31/2022 -------------------------------------------------------------------------------- [name] Geralyn Corwin DO Patient Name: Provider: Aug 15, 1951 [id number] Date of Birth: NPI#: [id number] Sex: DEA #: [phone] Phone #: License #: Eligha Bridegroom [facility] Patient Address: [address] [place], [place] [address] [address] [place], [place] [address] [phone] Allergies Augmentin Medication Medication: Route: Strength: Form: lidocaine 5 % topical cream topical 5% cream Class: HEMORRHOIDALS, LOCAL RECTAL ANESTHETICS Dose: Frequency / Time: Indication: cream topical in clinic prior debridement. Number of Refills: Number of Units: 0 Generic Substitution: Start Date: End Date: One Time Use: Substitution Permitted No Note to Pharmacy: Hand Signature: Date(s): Electronic Signature(s) Signed: 04/01/2022 1:41:40 PM By: Geralyn Corwin DO Previous Signature: 03/31/2022 5:06:20 PM Version By: Shawn Stall RN, BSN Entered By: Geralyn Corwin on 04/01/2022 13:24:33 -------------------------------------------------------------------------------- Problem List Details Patient Name: Date of Service: Marthenia Rolling [name] 03/31/2022 3:00 PM Medical Record Number: [id number] Patient  Account Number: [id number] Date of Birth/Sex: Treating RN: [birth year] (71 y.o. 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/31/2023 No Yes T79.8XXA Other early complications of trauma, initial encounter 11/19/2021 No Yes I48.0 Paroxysmal atrial fibrillation 11/19/2021 No Yes Z79.01 Long term (current) use of anticoagulants 11/19/2021 No Yes Inactive Problems Resolved Problems Electronic Signature(s) Signed: 04/01/2022 1:41:40 PM By: Geralyn Corwin DO Previous Signature: 03/31/2022 5:06:20 PM Version By: Shawn Stall RN, BSN Entered By: Geralyn Corwin on 04/01/2022 13:22:40 -------------------------------------------------------------------------------- Progress Note Details Patient Name: Date of Service: Marthenia Rolling [name] 03/31/2022 3:00 PM Medical Record Number: [id number] Patient Account Number: [id number] Date of Birth/Sex: Treating RN: 12/06/1950 (71 y.o. [name] Primary Care Provider: [name] Other Clinician: Referring Provider: Treating Provider/Extender: Annamary Rummage in Treatment: 18 Subjective Chief Complaint Information obtained from Patient 11/19/2021; Left lower extremity wound status post fall History of Present Illness (HPI) Admission 11/19/2021 [name] is a 71 year old female with a past medical history of paroxysmal A-fib on Eliquis, hypothyroidism, major depressive disorder, venous insufficiency and chronic diastolic heart failure that presents to the clinic for a 1 month history of nonhealing wound to the left lower extremity. She visited the ED on 10/18/2021 after a mechanical fall. She developed a hematoma that  subsequently opened. She was hospitalized for 7 days and discharged on 10/25/2021. She has been on several different antibiotics For the past month. She states that most recently she was on Levaquin and linezolid for the past week. She states she completes her antibiotic course tomorrow. She has been using Dakin's wet-to-dry dressings to the wound bed. She denies signs of infection. 4/7; patient presents for follow-up. She has been using Dakin's wet-to-dry dressings. She did end up going to the ED on 4/1 because she had excess bleeding with dressing change that she could not stop. She is on Eliquis for A-fib. In the ED they tied off a small artery. She has had  no issues since discharge. She denies signs of infection. 4/14; this is a very difficult clinical situation. A patient with underlying chronic venous insufficiency and lymphedema very significant lower extremity edema had a hematoma after a fall on her left upper lateral lower leg. She is on Eliquis for atrial fibrillation apparently with a history of a splenic infarct following with Dr. Berton Mount of cardiology. She has exhibited significant bleeding from the wound surface including ao Venous bleeder that required suturing short while ago. She has been using Dakin's wet-to-dry packing and over the surface of the wound. She saw Dr. Graciela Husbands yesterday he is reluctant to consider stopping the Eliquis because of the prior history of presumed cardioembolism. Wants to communicate with Dr. Mikey Bussing when she returns. In a perfect world where she was not on Eliquis she requires a wound VAC with additional compression wraps but I understand the reluctance to do this because of the concerns of bleeding 4/28; the patient's wound actually looks better today using Dakin's wet-to-dry that she is changing twice a day she is wrapping this with Kerlix and Ace wrapping. She tells me she had 2 small bleeding areas which were part of the superficial wound that stopped  this week with direct pressure. Other than that no major issues. In follow-up from discussion of last week Dr. 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 The bigger question from my point of view is the wound VAC issue. As far as she knows and her daughter-in-law verifies that she has not had any bleeding from the deeper parts of the wound although the bleeding has been superficial including the one that sent her to the ER for stitches. She is concerned that a wound VAC would cause further bleeding and I cannot completely allay those concerns. 5/8; patient presents for follow-up. She has no issues or complaints today. She has been using Dakin's wet-to-dry dressings without issues. She denies signs of infection. 5/12; patient presents for follow-up. She continues to use Dakin's wet-to-dry dressings without issues. She denies signs of infection. She reports some issues with bleeding at times but this has improved. She denies signs of infection. 6/1; patient presents for follow-up. She was recently hospitalized for upper left leg thigh cellulitis. She was given IV cefepime and vancomycin and discharged on oral antibiotics. She has been using Dakin's wet-to-dry dressings to the left lower leg wound. She reports improvement in healing. She currently denies systemic signs of infection. 6/7; patient is using Dakin's wet-to-dry twice daily. In general this looks better than when I saw this a month or so ago however still considerable depth to the tunnel in her left leg. She is going for iron infusions ordered by her primary care doctor I believe 6/15; patient presents for follow-up. She has been using Dakin's wet-to-dry dressings. She has noted some scattered small areas that blister up and heal on her left lower leg. She has been using  mupirocin ointment on them. Nothing open today. 6/23; patient's been using Dakin's wet-to-dry dressings to the tunneled wound and Hydrofera Blue to the opening. She has no issues or complaints today. 6/29; patient presents for follow-up. She has been using Dakin's wet-to-dry dressings to the tunneled wound and Hydrofera Blue to the opening. She states that [facility] [place] is sticking to the wound bed. She denies signs of infection.  7/7; patient presents for follow-up. She has been using Dakin's wet-to-dry dressings to the tunneled wound and PolyMem silver to the opening without issues. 7/13; patient presents for follow-up. She has been using PolyMem silver to the opening and the rope to the tunnel. She has no issues or complaints today. She denies signs of infection. 7/20; patient presents for follow-up. We have been using PolyMem silver to the wound bed. She has no issues or complaints today. She is receiving her custom compression garments tomorrow. 7/27; patient presents for follow-up. She has been using PolyMem silver to the wound bed. She is having her custom compression garments adjusted as these are not staying on very easily. 8/30; patient presents for follow-up. We have been using PolyMem silver to the wound bed. She is still waiting on her custom compression garments to be ordered. She denies signs of infection. 8/11; patient presents for follow-up. The wound VAC has been started and patient has been using this for the past week. Patient has home health who changes the wound VAC. She developed irritation to the periwound. DuoDERM was not being used to the periwound despite being ordered. Patient History Medical History Cardiovascular Patient has history of Congestive Heart Failure, Hypertension Hospitalization/Surgery History - Cellulitis left leg- 01/03/2022-01/07/2022. Medical A Surgical History Notes nd Constitutional Symptoms (General Health) Infarction of  spleen Hematologic/Lymphatic Hypothyroidism Cardiovascular A-Fib Gastrointestinal Gastroesophageal reflux Genitourinary Chronic kidney disease Musculoskeletal Osteoarthritis of knee Psychiatric Anxiety Objective Constitutional respirations regular, non-labored and within target range for patient.. Vitals Time Taken: 3:20 PM, Height: 67 in, Weight: 340 lbs, BMI: 53.2, Temperature: 98 F, Pulse: 66 bpm, Respiratory Rate: 16 breaths/min, Blood Pressure: 184/78 mmHg. Cardiovascular 2+ dorsalis pedis/posterior tibialis pulses. Psychiatric pleasant and cooperative. General Notes: Left lower extremity: Open wound with granulation tissue to the opening and increased depth and tunneling within. No signs of surrounding infection. Integumentary (Hair, Skin) Wound #1 status is Open. Original cause of wound was Trauma. The date acquired was: 10/18/2021. The wound has been in treatment 18 weeks. The wound is located on the Left,Lateral Lower Leg. The wound measures 0.8cm length x 0.9cm width x 1.8cm depth; 0.565cm^2 area and 1.018cm^3 volume. There is Fat Layer (Subcutaneous Tissue) exposed. There is no undermining noted, however, there is tunneling at 11:00 with a maximum distance of 4.4cm. There is a medium amount of serosanguineous drainage noted. The wound margin is distinct with the outline attached to the wound base. There is large (67-100%) red granulation within the wound bed. There is no necrotic tissue within the wound bed. Assessment Active Problems ICD-10 Non-pressure chronic ulcer of other part of left lower leg with fat layer exposed Other early complications of trauma, initial encounter Paroxysmal atrial fibrillation Long term (current) use of anticoagulants Patient's wound has shown improvement in size and appearance since last clinic visit. She has done well with the wound VAC however developed irritation to the periwound. No signs of infection. I recommended at this time  placing lotion to the periwound and restarting the wound VAC tomorrow. She will need DuoDERM placed prior to the wound VAC. These instructions were given last week to home health. We will resend orders. Follow-up in 1 week. Plan Follow-up Appointments: Return Appointment in 1 week. - Dr. Mikey Bussing and [place], Room 8 04/05/2022 1245pm Tuesday Return Appointment in 2 weeks. - Dr. Mikey Bussing and [place], Room 8 04/14/2022 1245pm Thursday Other: - Continue to wear compression stockings daily. Medical Modalities wound vac. Anesthetic: (In clinic) Topical Lidocaine 4% applied to wound bed  Bathing/ Shower/ Hygiene: May shower with protection but do not get wound dressing(s) wet. Negative Presssure Wound Therapy: Wound Vac to wound continuously at 127mm/hg pressure - home health to apply duoderm under drape to periwound where skin irritation is noted. Black and White Foam combination - Lightly pack white foam into the tunnel and undermining. Home health change three times a week Monday, Wednesday, and Friday. Wound center will apply dakin's wet to dry in clinic. Edema Control - Lymphedema / SCD / Other: Elevate legs to the level of the heart or above for 30 minutes daily and/or when sitting, a frequency of: - 3-4 times a day throughout the day. Avoid standing for long periods of time. Moisturize legs daily. - both legs every night before bed. Compression stocking or Garment 20-30 mm/Hg pressure to: - patient wear in the morning and remove at night. no ace wrap when wearing. Home Health: New wound care orders this week; continue Home Health for wound care. May utilize formulary equivalent dressing for wound treatment orders unless otherwise specified. - White and black foam Lightly pack white foam into the tunnel and undermining. Home health change three times a week Monday, Wednesday, and Friday. Wound center will apply dakin's wet to dry in clinic. HOME HEALTH TO PLEASE APPLY duoderm to periwound for  protection from the drape. Other Home Health Orders/Instructions: - [facility] The following medication(s) was prescribed: lidocaine topical 5 % cream cream topical in clinic prior debridement. was prescribed at facility WOUND #1: - Lower Leg Wound Laterality: Left, Lateral Cleanser: Wound Cleanser 1 x Per Day/30 Days Discharge Instructions: Cleanse the wound with wound cleanser prior to applying a clean dressing using gauze sponges, not tissue or cotton balls. Peri-Wound Care: Triamcinolone 15 (g) 1 x Per Day/30 Days Discharge Instructions: Use triamcinolone apply only in clinic today with lotion to periwound. Peri-Wound Care: Sween Lotion (Moisturizing lotion) 1 x Per Day/30 Days Discharge Instructions: Apply moisturizing lotion to left leg with dressing. Prim Dressing: Dakin's Solution 0.25%, 16 (oz) 1 x Per Day/30 Days ary Discharge Instructions: Moisten gauze with Dakin's in clinic only. Secondary Dressing: ABD Pad, 5x9 1 x Per Day/30 Days Discharge Instructions: Apply over primary dressing in clinic only. Secured With: [employer], 4.5x3.1 (in/yd) 1 x Per Day/30 Days Discharge Instructions: Secure with Kerlix in clinic. Secured With: ace wrap 1 x Per Day/30 Days Discharge Instructions: only in clinic. 1. Wound VAC, DuoDERM to the periwound 2. Can use Dakin's wet-to-dry gauze by the time the wound VAC is reapplied 3. Follow-up in 1 week Electronic Signature(s) Signed: 04/01/2022 1:41:40 PM By: Geralyn Corwin DO Entered By: Geralyn Corwin on 04/01/2022 13:27:42 -------------------------------------------------------------------------------- HxROS Details Patient Name: Date of Service: [name], [name] 03/31/2022 3:00 PM Medical Record Number: [id number] Patient Account Number: [id number] Date of Birth/Sex: Treating RN: [birth year] (71 y.o. [name] Primary Care Provider: [name] Other Clinician: Referring Provider: Treating Provider/Extender:  Annamary Rummage in Treatment: 18 Constitutional Symptoms (General Health) Medical History: Past Medical History Notes: Infarction of spleen Hematologic/Lymphatic Medical History: Past Medical History Notes: Hypothyroidism Cardiovascular Medical History: Positive for: Congestive Heart Failure; Hypertension Past Medical History Notes: A-Fib Gastrointestinal Medical History: Past Medical History Notes: Gastroesophageal reflux Genitourinary Medical History: Past Medical History Notes: Chronic kidney disease Musculoskeletal Medical History: Past Medical History Notes: Osteoarthritis of knee Psychiatric Medical History: Past Medical History Notes: Anxiety Immunizations Pneumococcal Vaccine: Received Pneumococcal Vaccination: No Implantable Devices No devices added Hospitalization / Surgery History Type of  Hospitalization/Surgery Cellulitis left leg- 01/03/2022-01/07/2022 Electronic Signature(s) Signed: 04/01/2022 1:41:40 PM By: Geralyn Corwin DO Signed: 04/01/2022 5:06:10 PM By: Shawn Stall RN, BSN Entered By: Geralyn Corwin on 04/01/2022 13:24:06 -------------------------------------------------------------------------------- SuperBill Details Patient Name: Date of Service: [name] 03/31/2022 Medical Record Number: [id number] Patient Account Number: [id number] Date of Birth/Sex: Treating RN: 28-Oct-1950 (71 y.o. [name] Primary Care Provider: [name] Other Clinician: Referring Provider: Treating Provider/Extender: Annamary Rummage in Treatment: 18 Diagnosis Coding ICD-10 Codes Code Description [phone] Non-pressure chronic ulcer of other part of left lower leg with fat layer exposed T79.8XXA Other early complications of trauma, initial encounter I48.0 Paroxysmal atrial fibrillation Z79.01 Long term (current) use of anticoagulants Facility Procedures CPT4 Code: [id number] Description: 99213  - WOUND CARE VISIT-LEV 3 EST PT Modifier: Quantity: 1 Physician Procedures : CPT4 Code Description Modifier [id number] - WC PHYS LEVEL 3 - EST PT ICD-10 Diagnosis Description L97.822 Non-pressure chronic ulcer of other part of left lower leg with fat layer exposed T79.8XXA Other early complications of trauma, initial  encounter I48.0 Paroxysmal atrial fibrillation Z79.01 Long term (current) use of anticoagulants Quantity: 1 Electronic Signature(s) Signed: 04/01/2022 1:41:40 PM By: Geralyn Corwin DO Previous Signature: 03/31/2022 5:06:20 PM Version By: Shawn Stall RN, BSN Entered By: Geralyn Corwin on 04/01/2022 13:27:56

## 2022-04-02 DIAGNOSIS — L97822 Non-pressure chronic ulcer of other part of left lower leg with fat layer exposed: Secondary | ICD-10-CM | POA: Diagnosis not present

## 2022-04-05 ENCOUNTER — Encounter (HOSPITAL_BASED_OUTPATIENT_CLINIC_OR_DEPARTMENT_OTHER): Payer: PPO | Admitting: Internal Medicine

## 2022-04-05 DIAGNOSIS — Z7901 Long term (current) use of anticoagulants: Secondary | ICD-10-CM | POA: Diagnosis not present

## 2022-04-05 DIAGNOSIS — I480 Paroxysmal atrial fibrillation: Secondary | ICD-10-CM | POA: Diagnosis not present

## 2022-04-05 DIAGNOSIS — L97822 Non-pressure chronic ulcer of other part of left lower leg with fat layer exposed: Secondary | ICD-10-CM

## 2022-04-05 DIAGNOSIS — T798XXA Other early complications of trauma, initial encounter: Secondary | ICD-10-CM | POA: Diagnosis not present

## 2022-04-06 NOTE — Progress Notes (Signed)
ELEN, RICKETTS ([id number]) Visit Report for 04/05/2022 Chief Complaint Document Details Patient Name: Date of Service: [name] 04/05/2022 12:45 PM Medical Record Number: [id number] Patient Account Number: [id number] Date of Birth/Sex: Treating RN: 1950/10/23 (71 y.o. [name] Primary Care Provider: Deland Pretty Other Clinician: Referring Provider: Treating Provider/Extender: Judie Grieve in [id number] Information Obtained from: Patient Chief Complaint 11/19/2021; Left lower extremity wound status post fall Electronic Signature(s) Signed: 04/05/2022 4:20:15 PM By: Kalman Shan DO Entered By: Kalman Shan on 04/05/2022 13:38:04 -------------------------------------------------------------------------------- HPI Details Patient Name: Date of Service: [name] 04/05/2022 12:45 PM Medical Record Number: [id number] Patient Account Number: [id number] Date of Birth/Sex: Treating RN: Feb 22, 1951 (71 y.o. [name] Primary Care Provider: Deland Pretty Other Clinician: Referring Provider: Treating Provider/Extender: Judie Grieve in Treatment: 19 History of Present Illness HPI Description: Admission 11/19/2021 Ms. Meilah Hempel is a 71 year old female with a past medical history of paroxysmal A-fib on Eliquis, hypothyroidism, major depressive disorder, venous insufficiency and chronic diastolic heart failure that presents to the clinic for a 1 month history of nonhealing wound to the left lower extremity. She visited the ED on 10/18/2021 after a mechanical fall. She developed a hematoma that subsequently opened. She was hospitalized for 7 days and discharged on 10/25/2021. She has been on several different antibiotics For the past month. She states that most recently she was on Levaquin and linezolid for the past week. She states she completes her antibiotic course tomorrow. She has been using  Dakin's wet-to-dry dressings to the wound bed. She denies signs of infection. 4/7; patient presents for follow-up. She has been using Dakin's wet-to-dry dressings. She did end up going to the ED on 4/1 because she had excess bleeding with dressing change that she could not stop. She is on Eliquis for A-fib. In the ED they tied off a small artery. She has had no issues since discharge. She denies signs of infection. 4/14; this is a very difficult clinical situation. A patient with underlying chronic venous insufficiency and lymphedema very significant lower extremity edema had a hematoma after a fall on her left upper lateral lower leg. She is on Eliquis for atrial fibrillation apparently with a history of a splenic infarct following with Dr. Jolyn Nap of cardiology. She has exhibited significant bleeding from the wound surface including ao Venous bleeder that required suturing short while ago. She has been using Dakin's wet-to-dry packing and over the surface of the wound. She saw Dr. Caryl Comes yesterday he is reluctant to consider stopping the Eliquis because of the prior history of presumed cardioembolism. Wants to communicate with Dr. Heber [place] when she returns. In a perfect world where she was not on Eliquis she requires a wound VAC with additional compression wraps but I understand the reluctance to do this because of the concerns of bleeding 4/28; the patient's wound actually looks better today using Dakin's wet-to-dry that she is changing twice a day she is wrapping this with Kerlix and Ace wrapping. She tells me she had 2 small bleeding areas which were part of the superficial wound that stopped this week with direct pressure. Other than that no major issues. In follow-up from discussion of last week Dr. 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 The bigger question  from my point of view is the wound VAC issue. As far as she knows and her daughter-in-law verifies that she has not had any bleeding from the deeper parts of the wound although the bleeding has been superficial including the one that sent her to the ER for stitches. She is concerned that a wound VAC would cause further bleeding and I cannot completely allay those concerns. 5/8; patient presents for follow-up. She has no issues or complaints today. She has been using Dakin's wet-to-dry dressings without issues. She denies signs of infection. 5/12; patient presents for follow-up. She continues to use Dakin's wet-to-dry dressings without issues. She denies signs of infection. She reports some issues with bleeding at times but this has improved. She denies signs of infection. 6/1; patient presents for follow-up. She was recently hospitalized for upper left leg thigh cellulitis. She was given IV cefepime and vancomycin and discharged on oral antibiotics. She has been using Dakin's wet-to-dry dressings to the left lower leg wound. She reports improvement in healing. She currently denies systemic signs of infection. 6/7; patient is using Dakin's wet-to-dry twice daily. In general this looks better than when I saw this a month or so ago however still considerable depth to the tunnel in her left leg. She is going for iron infusions ordered by her primary care doctor I believe 6/15; patient presents for follow-up. She has been using Dakin's wet-to-dry dressings. She has noted some scattered small areas that blister up and heal on her left lower leg. She has been using mupirocin ointment on them. Nothing open today. 6/23; patient's been using Dakin's wet-to-dry dressings to the tunneled wound and Hydrofera Blue to the opening. She has no issues or complaints today. 6/29; patient presents for follow-up. She has been using Dakin's wet-to-dry dressings to the tunneled wound  and Hydrofera Blue to the opening. She states that [facility] is sticking to the wound bed. She denies signs of infection. 7/7; patient presents for follow-up. She has been using Dakin's wet-to-dry dressings to the tunneled wound and PolyMem silver to the opening without issues. 7/13; patient presents for follow-up. She has been using PolyMem silver to the opening and the rope to the tunnel. She has no issues or complaints today. She denies signs of infection. 7/20; patient presents for follow-up. We have been using PolyMem silver to the wound bed. She has no issues or complaints today. She is receiving her custom compression garments tomorrow. 7/27; patient presents for follow-up. She has been using PolyMem silver to the wound bed. She is having her custom compression garments adjusted as these are not staying on very easily. 8/30; patient presents for follow-up. We have been using PolyMem silver to the wound bed. She is still waiting on her custom compression garments to be ordered. She denies signs of infection. 8/11; patient presents for follow-up. The wound VAC has been started and patient has been using this for the past week. Patient has home health who changes the wound VAC. She developed irritation to the periwound. DuoDERM was not being used to the periwound despite being ordered. 8/15; patient presents for follow-up. She restarted the wound VAC and has had improvement to the periwound with the use of DuoDERM. Electronic Signature(s) Signed: 04/05/2022 4:20:15 PM By: Geralyn Corwin DO Entered By: Geralyn Corwin on 04/05/2022 13:38:45 -------------------------------------------------------------------------------- Physical Exam Details Patient Name: Date of Service: Marthenia Rolling [name] 04/05/2022 12:45 PM Medical Record Number: [id number] Patient Account Number: [id number] Date of Birth/Sex: Treating RN: 1951/02/14 (71  y.o. [name] Primary Care Provider: Deland Pretty  Other Clinician: Referring Provider: Treating Provider/Extender: Judie Grieve in Treatment: 19 Constitutional respirations regular, non-labored and within target range for patient.. Cardiovascular 2+ dorsalis pedis/posterior tibialis pulses. Psychiatric pleasant and cooperative. Notes Left lower extremity: Open wound with granulation tissue to the opening and increased depth and tunneling within. No signs of surrounding infection. No irritation to the periwound. Electronic Signature(s) Signed: 04/05/2022 4:20:15 PM By: Kalman Shan DO Entered By: Kalman Shan on 04/05/2022 13:39:11 -------------------------------------------------------------------------------- Physician Orders Details Patient Name: Date of Service: [name] 04/05/2022 12:45 PM Medical Record Number: [id number] Patient Account Number: [id number] Date of Birth/Sex: Treating RN: 12-08-1950 (71 y.o. [name] Primary Care Provider: Deland Pretty Other Clinician: Referring Provider: Treating Provider/Extender: Judie Grieve in Treatment: 31 Verbal / Phone Orders: No Diagnosis Coding ICD-10 Coding Code Description [phone] Non-pressure chronic ulcer of other part of left lower leg with fat layer exposed T79.8XXA Other early complications of trauma, initial encounter I48.0 Paroxysmal atrial fibrillation Z79.01 Long term (current) use of anticoagulants Follow-up Appointments ppointment in 1 week. - Dr. Heber [place] and [place], Room 8 04/14/2022 1245pm Thursday Return A ppointment in 2 weeks. - Dr. Heber [place] and [place], Room 8 04/19/2022 1245pm Tuesday Return A Other: - Continue to wear compression stockings daily. Medical Modalities wound vac. Anesthetic (In clinic) Topical Lidocaine 4% applied to wound bed Bathing/ Shower/ Hygiene May shower with protection but do not get wound dressing(s) wet. Negative Presssure Wound Therapy Wound Vac to wound  continuously at 135mm/hg pressure - home health to apply duoderm under drape to periwound where skin irritation is noted. Black and White Foam combination - Lightly pack white foam into the tunnel and undermining. Home health change three times a week Monday, Wednesday, and Friday. Wound center will apply dakin's wet to dry in clinic. Edema Control - Lymphedema / SCD / Other Elevate legs to the level of the heart or above for 30 minutes daily and/or when sitting, a frequency of: - 3-4 times a day throughout the day. Avoid standing for long periods of time. Moisturize legs daily. - both legs every night before bed. Compression stocking or Garment 20-30 mm/Hg pressure to: - patient wear in the morning and remove at night. no ace wrap when wearing. Home Health No change in wound care orders this week; continue Home Health for wound care. May utilize formulary equivalent dressing for wound treatment orders unless otherwise specified. - White and black foam Lightly pack white foam into the tunnel and undermining. Home health change three times a week Monday, Wednesday, and Friday. Wound center will apply dakin's wet to dry in clinic. HOME HEALTH TO PLEASE APPLY duoderm to periwound for protection from the drape. Other Home Health Orders/Instructions: - [facility] Wound Treatment Wound #1 - Lower Leg Wound Laterality: Left, Lateral Cleanser: Wound Cleanser 1 x Per Day/30 Days Discharge Instructions: Cleanse the wound with wound cleanser prior to applying a clean dressing using gauze sponges, not tissue or cotton balls. Peri-Wound Care: Ketoconazole Cream 2% 1 x Per Day/30 Days Discharge Instructions: Apply Ketoconazole as directed mix with TCA in clinic only. Peri-Wound Care: Triamcinolone 15 (g) 1 x Per Day/30 Days Discharge Instructions: Use triamcinolone apply only in clinic today with lotion to periwound. Peri-Wound Care: Sween Lotion (Moisturizing lotion) 1 x Per Day/30 Days Discharge  Instructions: Apply moisturizing lotion to left leg with dressing. Prim Dressing: Dakin's Solution 0.25%, 16 (oz) 1 x Per  Day/30 Days ary Discharge Instructions: Moisten gauze with Dakin's in clinic only. Secondary Dressing: ABD Pad, 5x9 1 x Per Day/30 Days Discharge Instructions: Apply over primary dressing in clinic only. Secured With: [employer], 4.5x3.1 (in/yd) 1 x Per Day/30 Days Discharge Instructions: Secure with Kerlix in clinic. Secured With: ace wrap 1 x Per Day/30 Days Discharge Instructions: only in clinic. Electronic Signature(s) Signed: 04/05/2022 4:20:15 PM By: Kalman Shan DO Entered By: Kalman Shan on 04/05/2022 13:39:23 -------------------------------------------------------------------------------- Problem List Details Patient Name: Date of Service: [name] 04/05/2022 12:45 PM Medical Record Number: [id number] Patient Account Number: [id number] Date of Birth/Sex: Treating RN: 1951/06/02 (71 y.o. 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 04/05/2022 12:45 PM Medical Record Number: [id number] Patient Account  Number: [id number] Date of Birth/Sex: Treating RN: November 13, 1950 (71 y.o. [name] Primary Care Provider: Deland Pretty Other Clinician: Referring Provider: Treating Provider/Extender: Judie Grieve in Treatment: 37 Subjective Chief Complaint Information obtained from Patient 11/19/2021; Left lower extremity wound status post fall History of Present Illness (HPI) Admission 11/19/2021 Ms. Sheneika Merlin is a 71 year old female with a past medical history of paroxysmal A-fib on Eliquis, hypothyroidism, major depressive disorder, venous insufficiency and chronic diastolic heart failure that presents to the clinic for a 1 month history of nonhealing wound to the left lower extremity. She visited the ED on 10/18/2021 after a mechanical fall. She developed a hematoma that subsequently opened. She was hospitalized for 7 days and discharged on 10/25/2021. She has been on several different antibiotics For the past month. She states that most recently she was on Levaquin and linezolid for the past week. She states she completes her antibiotic course tomorrow. She has been using Dakin's wet-to-dry dressings to the wound bed. She denies signs of infection. 4/7; patient presents for follow-up. She has been using Dakin's wet-to-dry dressings. She did end up going to the ED on 4/1 because she had excess bleeding with dressing change that she could not stop. She is on Eliquis for A-fib. In the ED they tied off a small artery. She has had no issues since discharge. She denies signs of infection. 4/14; this is a very difficult clinical situation. A patient with underlying chronic venous insufficiency and lymphedema very significant lower extremity edema had a hematoma after a fall on her left upper lateral lower leg. She is on Eliquis for atrial fibrillation apparently with a history of a splenic infarct following with Dr. Jolyn Nap of cardiology. She has exhibited significant  bleeding from the wound surface including ao Venous bleeder that required suturing short while ago. She has been using Dakin's wet-to-dry packing and over the surface of the wound. She saw Dr. Caryl Comes yesterday he is reluctant to consider stopping the Eliquis because of the prior history of presumed cardioembolism. Wants to communicate with Dr. Heber [place] when she returns. In a perfect world  where she was not on Eliquis she requires a wound VAC with additional compression wraps but I understand the reluctance to do this because of the concerns of bleeding 4/28; the patient's wound actually looks better today using Dakin's wet-to-dry that she is changing twice a day she is wrapping this with Kerlix and Ace wrapping. She tells me she had 2 small bleeding areas which were part of the superficial wound that stopped this week with direct pressure. Other than that no major issues. In follow-up from discussion of last week Dr. 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 The bigger question from my point of view is the wound VAC issue. As far as she knows and her daughter-in-law verifies that she has not had any bleeding from the deeper parts of the wound although the bleeding has been superficial including the one that sent her to the ER for stitches. She is concerned that a wound VAC would cause further bleeding and I cannot completely allay those concerns. 5/8; patient presents for follow-up. She has no issues or complaints today. She has been using Dakin's wet-to-dry dressings without issues. She denies signs of infection. 5/12; patient presents for follow-up. She continues to use Dakin's wet-to-dry dressings without issues. She denies signs of infection. She reports some issues with bleeding at times but this has improved. She denies signs of  infection. 6/1; patient presents for follow-up. She was recently hospitalized for upper left leg thigh cellulitis. She was given IV cefepime and vancomycin and discharged on oral antibiotics. She has been using Dakin's wet-to-dry dressings to the left lower leg wound. She reports improvement in healing. She currently denies systemic signs of infection. 6/7; patient is using Dakin's wet-to-dry twice daily. In general this looks better than when I saw this a month or so ago however still considerable depth to the tunnel in her left leg. She is going for iron infusions ordered by her primary care doctor I believe 6/15; patient presents for follow-up. She has been using Dakin's wet-to-dry dressings. She has noted some scattered small areas that blister up and heal on her left lower leg. She has been using mupirocin ointment on them. Nothing open today. 6/23; patient's been using Dakin's wet-to-dry dressings to the tunneled wound and Hydrofera Blue to the opening. She has no issues or complaints today. 6/29; patient presents for follow-up. She has been using Dakin's wet-to-dry dressings to the tunneled wound and Hydrofera Blue to the opening. She states that [facility] is sticking to the wound bed. She denies signs of infection. 7/7; patient presents for follow-up. She has been using Dakin's wet-to-dry dressings to the tunneled wound and PolyMem silver to the opening without issues. 7/13; patient presents for follow-up. She has been using PolyMem silver to the opening and the rope to the tunnel. She has no issues or complaints today. She denies signs of infection. 7/20; patient presents for follow-up. We have been using PolyMem silver to the wound bed. She has no issues or complaints today. She is receiving her custom compression garments tomorrow. 7/27; patient presents for follow-up. She has been using PolyMem silver to the wound bed. She is having her custom compression garments adjusted as  these are not staying on very easily. 8/30; patient presents for follow-up. We have been using PolyMem silver to the wound bed. She  is still waiting on her custom compression garments to be ordered. She denies signs of infection. 8/11; patient presents for follow-up. The wound VAC has been started and patient has been using this for the past week. Patient has home health who changes the wound VAC. She developed irritation to the periwound. DuoDERM was not being used to the periwound despite being ordered. 8/15; patient presents for follow-up. She restarted the wound VAC and has had improvement to the periwound with the use of DuoDERM. Patient History Medical History Cardiovascular Patient has history of Congestive Heart Failure, Hypertension Hospitalization/Surgery History - Cellulitis left leg- 01/03/2022-01/07/2022. Medical A Surgical History Notes nd Constitutional Symptoms (General Health) Infarction of spleen Hematologic/Lymphatic Hypothyroidism Cardiovascular A-Fib Gastrointestinal Gastroesophageal reflux Genitourinary Chronic kidney disease Musculoskeletal Osteoarthritis of knee Psychiatric Anxiety Objective Constitutional respirations regular, non-labored and within target range for patient.. Vitals Time Taken: 12:45 PM, Height: 67 in, Weight: 340 lbs, BMI: 53.2, Temperature: 97.8 F, Pulse: 54 bpm, Respiratory Rate: 18 breaths/min, Blood Pressure: 151/87 mmHg. Cardiovascular 2+ dorsalis pedis/posterior tibialis pulses. Psychiatric pleasant and cooperative. General Notes: Left lower extremity: Open wound with granulation tissue to the opening and increased depth and tunneling within. No signs of surrounding infection. No irritation to the periwound. Integumentary (Hair, Skin) Wound #1 status is Open. Original cause of wound was Trauma. The date acquired was: 10/18/2021. The wound has been in treatment 19 weeks. The wound is located on the Left,Lateral Lower Leg. The  wound measures 0.6cm length x 0.6cm width x 1cm depth; 0.283cm^2 area and 0.283cm^3 volume. There is Fat Layer (Subcutaneous Tissue) exposed. There is no undermining noted, however, there is tunneling at 11:00 with a maximum distance of 4.5cm. There is a medium amount of serosanguineous drainage noted. The wound margin is distinct with the outline attached to the wound base. There is large (67-100%) red granulation within the wound bed. There is no necrotic tissue within the wound bed. Assessment Active Problems ICD-10 Non-pressure chronic ulcer of other part of left lower leg with fat layer exposed Other early complications of trauma, initial encounter Paroxysmal atrial fibrillation Long term (current) use of anticoagulants Patient's wound is stable. The periwound has improved in appearance with the start of DuoDERM under the wound VAC. At this time I recommended continuing both treatments. We will give this therapy a few weeks given that she start with traditional wound care dressings. Follow-up in 1 week. Plan Follow-up Appointments: Return Appointment in 1 week. - Dr. Mikey Bussing and [place], Room 8 04/14/2022 1245pm Thursday Return Appointment in 2 weeks. - Dr. Mikey Bussing and [place], Room 8 04/19/2022 1245pm Tuesday Other: - Continue to wear compression stockings daily. Medical Modalities wound vac. Anesthetic: (In clinic) Topical Lidocaine 4% applied to wound bed Bathing/ Shower/ Hygiene: May shower with protection but do not get wound dressing(s) wet. Negative Presssure Wound Therapy: Wound Vac to wound continuously at 167mm/hg pressure - home health to apply duoderm under drape to periwound where skin irritation is noted. Black and White Foam combination - Lightly pack white foam into the tunnel and undermining. Home health change three times a week Monday, Wednesday, and Friday. Wound center will apply dakin's wet to dry in clinic. Edema Control - Lymphedema / SCD / Other: Elevate legs to the  level of the heart or above for 30 minutes daily and/or when sitting, a frequency of: - 3-4 times a day throughout the day. Avoid standing for long periods of time. Moisturize legs daily. - both legs every night before bed. Compression stocking  or Garment 20-30 mm/Hg pressure to: - patient wear in the morning and remove at night. no ace wrap when wearing. Home Health: No change in wound care orders this week; continue Home Health for wound care. May utilize formulary equivalent dressing for wound treatment orders unless otherwise specified. - White and black foam Lightly pack white foam into the tunnel and undermining. Home health change three times a week Monday, Wednesday, and Friday. Wound center will apply dakin's wet to dry in clinic. HOME HEALTH TO PLEASE APPLY duoderm to periwound for protection from the drape. Other Home Health Orders/Instructions: - [facility] WOUND #1: - Lower Leg Wound Laterality: Left, Lateral Cleanser: Wound Cleanser 1 x Per Day/30 Days Discharge Instructions: Cleanse the wound with wound cleanser prior to applying a clean dressing using gauze sponges, not tissue or cotton balls. Peri-Wound Care: Ketoconazole Cream 2% 1 x Per Day/30 Days Discharge Instructions: Apply Ketoconazole as directed mix with TCA in clinic only. Peri-Wound Care: Triamcinolone 15 (g) 1 x Per Day/30 Days Discharge Instructions: Use triamcinolone apply only in clinic today with lotion to periwound. Peri-Wound Care: Sween Lotion (Moisturizing lotion) 1 x Per Day/30 Days Discharge Instructions: Apply moisturizing lotion to left leg with dressing. Prim Dressing: Dakin's Solution 0.25%, 16 (oz) 1 x Per Day/30 Days ary Discharge Instructions: Moisten gauze with Dakin's in clinic only. Secondary Dressing: ABD Pad, 5x9 1 x Per Day/30 Days Discharge Instructions: Apply over primary dressing in clinic only. Secured With: [employer], 4.5x3.1 (in/yd) 1 x Per Day/30 Days Discharge  Instructions: Secure with Kerlix in clinic. Secured With: ace wrap 1 x Per Day/30 Days Discharge Instructions: only in clinic. 1. Continue wound VAC Electronic Signature(s) Signed: 04/05/2022 4:20:15 PM By: Kalman Shan DO Entered By: Kalman Shan on 04/05/2022 13:41:14 -------------------------------------------------------------------------------- HxROS Details Patient Name: Date of Service: Link Snuffer, [name] 04/05/2022 12:45 PM Medical Record Number: [id number] Patient Account Number: [id number] Date of Birth/Sex: Treating RN: Apr 29, 1951 (71 y.o. [name] Primary Care Provider: Deland Pretty Other Clinician: Referring Provider: Treating Provider/Extender: Judie Grieve in Treatment: 19 Constitutional Symptoms (General Health) Medical History: Past Medical History Notes: Infarction of spleen Hematologic/Lymphatic Medical History: Past Medical History Notes: Hypothyroidism Cardiovascular Medical History: Positive for: Congestive Heart Failure; Hypertension Past Medical History Notes: A-Fib Gastrointestinal Medical History: Past Medical History Notes: Gastroesophageal reflux Genitourinary Medical History: Past Medical History Notes: Chronic kidney disease Musculoskeletal Medical History: Past Medical History Notes: Osteoarthritis of knee Psychiatric Medical History: Past Medical History Notes: Anxiety Immunizations Pneumococcal Vaccine: Received Pneumococcal Vaccination: No Implantable Devices No devices added Hospitalization / Surgery History Type of Hospitalization/Surgery Cellulitis left leg- 01/03/2022-01/07/2022 Electronic Signature(s) Signed: 04/05/2022 4:20:15 PM By: Kalman Shan DO Signed: 04/05/2022 4:22:40 PM By: Deon Pilling RN, BSN Entered By: Kalman Shan on 04/05/2022 13:38:49 -------------------------------------------------------------------------------- [place] Details Patient Name: Date  of Service: [name] 04/05/2022 Medical Record Number: [id number] Patient Account Number: [id number] Date of Birth/Sex: Treating RN: [birth year] (71 y.o. [name] Primary Care Provider: Deland Pretty Other Clinician: Referring Provider: Treating Provider/Extender: Judie Grieve in Treatment: 19 Diagnosis Coding ICD-10 Codes Code Description [phone] Non-pressure chronic ulcer of other part of left lower leg with fat layer exposed T79.8XXA Other early complications of trauma, initial encounter I48.0 Paroxysmal atrial fibrillation Z79.01 Long term (current) use of anticoagulants Facility Procedures CPT4 Code: [id number] Description: 99214 - WOUND CARE VISIT-LEV 4 EST PT Modifier: Quantity: 1 Physician Procedures : CPT4 Code Description Modifier [id number] -  WC PHYS LEVEL 3 - EST PT ICD-10 Diagnosis Description L97.822 Non-pressure chronic ulcer of other part of left lower leg with fat layer exposed T79.8XXA Other early complications of trauma, initial  encounter I48.0 Paroxysmal atrial fibrillation Z79.01 Long term (current) use of anticoagulants Quantity: 1 Electronic Signature(s) Signed: 04/05/2022 4:20:15 PM By: Kalman Shan DO Signed: 04/05/2022 4:20:15 PM By: Kalman Shan DO Entered By: Kalman Shan on 04/05/2022 13:41:27

## 2022-04-06 NOTE — Progress Notes (Signed)
Christine, [name] ([id number]) Visit Report for 04/05/2022 Arrival Information Details Patient Name: Date of Service: [name] 04/05/2022 12:45 PM Medical Record Number: [id number] Patient Account Number: [id number] Date of Birth/Sex: Treating RN: [birth year] (71 y.o. [name], Meta.Reding Primary Care Feven Alderfer: Deland Pretty Other Clinician: Referring Cloys Vera: Treating Kaaliyah Kita/Extender: Judie Grieve in Treatment: 58 Visit Information History Since Last Visit Added or deleted any medications: No Patient Arrived: [name] Any new allergies or adverse reactions: No Arrival Time: 12:44 Had a fall or experienced change in No Accompanied By: son activities of daily living that may affect Transfer Assistance: None risk of falls: Patient Identification Verified: Yes Signs or symptoms of abuse/neglect since last visito No Secondary Verification Process Completed: Yes Hospitalized since last visit: No Patient Requires Transmission-Based Precautions: No Implantable device outside of the clinic excluding No Patient Has Alerts: Yes cellular tissue based products placed in the center Patient Alerts: Patient on Blood Thinner since last visit: Has Dressing in Place as Prescribed: No Pain Present Now: No Electronic Signature(s) Signed: 04/05/2022 4:48:52 PM By: Erenest Blank Entered By: Erenest Blank on 04/05/2022 12:45:26 -------------------------------------------------------------------------------- Clinic Level of Care Assessment Details Patient Name: Date of Service: [name] 04/05/2022 12:45 PM Medical Record Number: [id number] Patient Account Number: [id number] Date of Birth/Sex: Treating RN: [birth year] (71 y.o. [name], Meta.Reding Primary Care Colen Eltzroth: Deland Pretty Other Clinician: Referring Libra Gatz: Treating Adrean Findlay/Extender: Judie Grieve in Treatment: 19 Clinic Level of Care Assessment Items TOOL 4 Quantity  Score X- 1 0 Use when only an EandM is performed on FOLLOW-UP visit ASSESSMENTS - Nursing Assessment / Reassessment X- 1 10 Reassessment of Co-morbidities (includes updates in patient status) X- 1 5 Reassessment of Adherence to Treatment Plan ASSESSMENTS - Wound and Skin A ssessment / Reassessment X - Simple Wound Assessment / Reassessment - one wound 1 5 _0  - 0 Complex Wound Assessment / Reassessment - multiple wounds X- 1 10 Dermatologic / Skin Assessment (not related to wound area) ASSESSMENTS - Focused Assessment X- 1 5 Circumferential Edema Measurements - multi extremities _1  - 0 Nutritional Assessment / Counseling / Intervention _2  - 0 Lower Extremity Assessment (monofilament, tuning fork, pulses) _3  - 0 Peripheral Arterial Disease Assessment (using hand held doppler) ASSESSMENTS - Ostomy and/or Continence Assessment and Care _4  - 0 Incontinence Assessment and Management _5  - 0 Ostomy Care Assessment and Management (repouching, etc.) PROCESS - Coordination of Care X - Simple Patient / Family Education for ongoing care 1 15 _6  - 0 Complex (extensive) Patient / Family Education for ongoing care X- 1 10 Staff obtains Programmer, systems, Records, T Results / Process Orders est X- 1 10 Staff telephones HHA, Nursing Homes / Clarify orders / etc _7  - 0 Routine Transfer to another Facility (non-emergent condition) _8  - 0 Routine Hospital Admission (non-emergent condition) _9  - 0 New Admissions / Biomedical engineer / Ordering NPWT Apligraf, etc. , _10  - 0 Emergency Hospital Admission (emergent condition) X- 1 10 Simple Discharge Coordination _11  - 0 Complex (extensive) Discharge Coordination PROCESS - Special Needs _12  - 0 Pediatric / Minor Patient Management _13  - 0 Isolation Patient Management _14  - 0 Hearing / Language / Visual special needs _15  - 0 Assessment of Community assistance (transportation, D/C planning, etc.) _16  - 0 Additional assistance / Altered  mentation _17  - 0 Support Surface(s) Assessment (bed, cushion, seat, etc.) INTERVENTIONS - Wound Cleansing / Measurement X - Simple Wound Cleansing - one wound 1 5 _18  - 0  Complex Wound Cleansing - multiple wounds X- 1 5 Wound Imaging (photographs - any number of wounds) _0  - 0 Wound Tracing (instead of photographs) X- 1 5 Simple Wound Measurement - one wound _1  - 0 Complex Wound Measurement - multiple wounds INTERVENTIONS - Wound Dressings _2  - 0 Small Wound Dressing one or multiple wounds X- 1 15 Medium Wound Dressing one or multiple wounds _3  - 0 Large Wound Dressing one or multiple wounds X- 1 5 Application of Medications - topical <[id number]>_3<\/[id number]>_4  - 0 Application of Medications - injection INTERVENTIONS - Miscellaneous _5  - 0 External ear exam _6  - 0 Specimen Collection (cultures, biopsies, blood, body fluids, etc.) _7  - 0 Specimen(s) / Culture(s) sent or taken to Lab for analysis _8  - 0 Patient Transfer (multiple staff / Civil Service fast streamer / Similar devices) _9  - 0 Simple Staple / Suture removal (25 or less) _10  - 0 Complex Staple / Suture removal (26 or more) _11  - 0 Hypo / Hyperglycemic Management (close monitor of Blood Glucose) _12  - 0 Ankle / Brachial Index (ABI) - do not check if billed separately X- 1 5 Vital Signs Has the patient been seen at the hospital within the last three years: Yes Total Score: 120 Level Of Care: New/Established - Level 4 Electronic Signature(s) Signed: 04/05/2022 4:22:40 PM By: Deon Pilling RN, BSN Entered By: Deon Pilling on 04/05/2022 13:10:29 -------------------------------------------------------------------------------- Encounter Discharge Information Details Patient Name: Date of Service: Christine Simmonds J. 04/05/2022 12:45 PM Medical Record Number: [id number] Patient Account Number: [id number] Date of Birth/Sex: Treating RN: 09/03/1950 (71 y.o. [name] Primary Care Sylvester Salonga: Deland Pretty Other Clinician: Referring  Matisse Roskelley: Treating Alyza Artiaga/Extender: Judie Grieve in Treatment: 19 Encounter Discharge Information Items Discharge Condition: Stable Ambulatory Status: Ambulatory Discharge Destination: Home Transportation: Private Auto Accompanied By: self Schedule Follow-up Appointment: Yes Clinical Summary of Care: Electronic Signature(s) Signed: 04/05/2022 4:22:40 PM By: Deon Pilling RN, BSN Entered By: Deon Pilling on 04/05/2022 13:11:16 -------------------------------------------------------------------------------- Lower Extremity Assessment Details Patient Name: Date of Service: Christine Debar [name] 04/05/2022 12:45 PM Medical Record Number: [id number] Patient Account Number: [id number] Date of Birth/Sex: Treating RN: May 22, 1951 (71 y.o. [name] Primary Care Jontavious Commons: Deland Pretty Other Clinician: Referring Viren Lebeau: Treating Adalai Perl/Extender: Judie Grieve in Treatment: 19 Edema Assessment Assessed: Shirlyn Goltz: No] Patrice Paradise: No] Edema: [Left: Ye] [Right: s] Calf Left: Right: Point of Measurement: 31 cm From Medial Instep 49 cm Ankle Left: Right: Point of Measurement: 9 cm From Medial Instep 25.9 cm Electronic Signature(s) Signed: 04/05/2022 4:22:40 PM By: Deon Pilling RN, BSN Signed: 04/05/2022 4:48:52 PM By: Erenest Blank Entered By: Erenest Blank on 04/05/2022 12:55:58 -------------------------------------------------------------------------------- Multi Wound Chart Details Patient Name: Date of Service: [name]. 04/05/2022 12:45 PM Medical Record Number: [id number] Patient Account Number: [id number] Date of Birth/Sex: Treating RN: 23-Sep-1950 (71 y.o. [name] Primary Care Kuba Shepherd: Deland Pretty Other Clinician: Referring Rupal Childress: Treating Anish Vana/Extender: Judie Grieve in Treatment: 19 Vital Signs Height(in): 67 Pulse(bpm): 54 Weight(lbs): 340 Blood Pressure(mmHg):  151/87 Body Mass Index(BMI): 53.2 Temperature(F): 97.8 Respiratory Rate(breaths/min): 18 Photos: [N/A:N/A] Left, Lateral Lower Leg N/A N/A Wound Location: Trauma N/A N/A Wounding Event: Trauma, Other N/A N/A Primary Etiology: Congestive Heart Failure, N/A N/A Comorbid History: Hypertension 10/18/2021 N/A N/A Date Acquired: 45 N/A N/A Weeks of Treatment: Open N/A N/A Wound Status: No N/A N/A Wound Recurrence: Yes N/A N/A Clustered Wound: 1 N/A N/A Clustered Quantity: 0.6x0.6x1 N/A N/A Measurements L x W  x D (cm) 0.283 N/A N/A A (cm) : rea 0.283 N/A N/A Volume (cm) : 99.90% N/A N/A % Reduction in A rea: 100.00% N/A N/A % Reduction in Volume: 11 Position 1 (o'clock): 4.5 Maximum Distance 1 (cm): Yes N/A N/A Tunneling: Full Thickness Without Exposed N/A N/A Classification: Support Structures Medium N/A N/A Exudate Amount: Serosanguineous N/A N/A Exudate Type: red, brown N/A N/A Exudate Color: Distinct, outline attached N/A N/A Wound Margin: Large (67-100%) N/A N/A Granulation Amount: Red N/A N/A Granulation Quality: None Present (0%) N/A N/A Necrotic Amount: Fat Layer (Subcutaneous Tissue): Yes N/A N/A Exposed Structures: Fascia: No Tendon: No Muscle: No Joint: No Bone: No Small (1-33%) N/A N/A Epithelialization: Treatment Notes Wound #1 (Lower Leg) Wound Laterality: Left, Lateral Cleanser Wound Cleanser Discharge Instruction: Cleanse the wound with wound cleanser prior to applying a clean dressing using gauze sponges, not tissue or cotton balls. Peri-Wound Care Ketoconazole Cream 2% Discharge Instruction: Apply Ketoconazole as directed mix with TCA in clinic only. Triamcinolone 15 (g) Discharge Instruction: Use triamcinolone apply only in clinic today with lotion to periwound. Sween Lotion (Moisturizing lotion) Discharge Instruction: Apply moisturizing lotion to left leg with dressing. Topical Primary Dressing Dakin's Solution 0.25%,  16 (oz) Discharge Instruction: Moisten gauze with Dakin's in clinic only. Secondary Dressing ABD Pad, 5x9 Discharge Instruction: Apply over primary dressing in clinic only. Secured With [employer], 4.5x3.1 (in/yd) Discharge Instruction: Secure with Kerlix in clinic. ace wrap Discharge Instruction: only in clinic. Compression Wrap Compression Stockings Add-Ons Electronic Signature(s) Signed: 04/05/2022 4:20:15 PM By: Kalman Shan DO Signed: 04/05/2022 4:22:40 PM By: Deon Pilling RN, BSN Entered By: Kalman Shan on 04/05/2022 13:37:57 -------------------------------------------------------------------------------- Multi-Disciplinary Care Plan Details Patient Name: Date of Service: [name]. 04/05/2022 12:45 PM Medical Record Number: [id number] Patient Account Number: [id number] Date of Birth/Sex: Treating RN: Jan 01, 1951 (71 y.o. [name], Meta.Reding Primary Care Steve Gregg: Deland Pretty Other Clinician: Referring Kevin Space: Treating Adonis Yim/Extender: Judie Grieve in Treatment: 66 Active Inactive Abuse / Safety / Falls / Self Care Management Nursing Diagnoses: History of Falls Potential for falls Goals: Patient/caregiver will verbalize/demonstrate measure taken to improve self care Date Initiated: 11/19/2021 Date Inactivated: 04/05/2022 Target Resolution Date: 04/21/2022 Goal Status: Met Patient/caregiver will verbalize/demonstrate measures taken to prevent injury and/or falls Date Initiated: 11/19/2021 Target Resolution Date: 04/21/2022 Goal Status: Active Interventions: Provide education on basic hygiene Provide education on fall prevention Provide education on HBO safety Provide education on vaccinations Notes: Pain, Acute or Chronic Nursing Diagnoses: Pain, acute or chronic: actual or potential Potential alteration in comfort, pain Goals: Patient will verbalize adequate pain control and receive pain control  interventions during procedures as needed Date Initiated: 11/19/2021 Date Inactivated: 04/05/2022 Target Resolution Date: 04/22/2022 Goal Status: Met Patient/caregiver will verbalize comfort level met Date Initiated: 11/19/2021 Target Resolution Date: 04/22/2022 Goal Status: Active Interventions: Encourage patient to take pain medications as prescribed Provide education on pain management Reposition patient for comfort Treatment Activities: Administer pain control measures as ordered : 11/19/2021 Notes: Electronic Signature(s) Signed: 04/05/2022 4:22:40 PM By: Deon Pilling RN, BSN Entered By: Deon Pilling on 04/05/2022 12:54:52 -------------------------------------------------------------------------------- Pain Assessment Details Patient Name: Date of Service: [name]. 04/05/2022 12:45 PM Medical Record Number: [id number] Patient Account Number: [id number] Date of Birth/Sex: Treating RN: [birth year] (71 y.o. [name] Primary Care Anneta Rounds: Deland Pretty Other Clinician: Referring Juluis Fitzsimmons: Treating Natali Lavallee/Extender: Judie Grieve in Treatment: 19 Active Problems Location of Pain Severity and Description of Pain Patient  Has Paino No Site Locations Pain Management and Medication Current Pain Management: Electronic Signature(s) Signed: 04/05/2022 4:22:40 PM By: Deon Pilling RN, BSN Signed: 04/05/2022 4:48:52 PM By: Erenest Blank Entered By: Erenest Blank on 04/05/2022 12:46:25 -------------------------------------------------------------------------------- Patient/Caregiver Education Details Patient Name: Date of Service: [name] 8/15/2023andnbsp12:45 PM Medical Record Number: [id number] Patient Account Number: [id number] Date of Birth/Gender: Treating RN: [birth year] (71 y.o. [name] Primary Care Physician: Deland Pretty Other Clinician: Referring Physician: Treating Physician/Extender: Judie Grieve in Treatment: 26 Education Assessment Education Provided To: Patient Education Topics Provided Wound/Skin Impairment: Handouts: Skin Care Do's and Dont's Methods: Explain/Verbal Responses: Reinforcements needed Electronic Signature(s) Signed: 04/05/2022 4:22:40 PM By: Deon Pilling RN, BSN Entered By: Deon Pilling on 04/05/2022 12:55:03 -------------------------------------------------------------------------------- Wound Assessment Details Patient Name: Date of Service: [name]. 04/05/2022 12:45 PM Medical Record Number: [id number] Patient Account Number: [id number] Date of Birth/Sex: Treating RN: 16-Aug-1951 (71 y.o. [name], Meta.Reding Primary Care Jandy Brackens: Deland Pretty Other Clinician: Referring Shital Crayton: Treating Marwah Disbro/Extender: Judie Grieve in Treatment: 19 Wound Status Wound Number: 1 Primary Etiology: Trauma, Other Wound Location: Left, Lateral Lower Leg Wound Status: Open Wounding Event: Trauma Comorbid History: Congestive Heart Failure, Hypertension Date Acquired: 10/18/2021 Weeks Of Treatment: 19 Clustered Wound: Yes Photos Wound Measurements Length: (cm) 0.6 Width: (cm) 0.6 Depth: (cm) 1 Clustered Quantity: 1 Area: (cm) 0.283 Volume: (cm) 0.283 % Reduction in Area: 99.9% % Reduction in Volume: 100% Epithelialization: Small (1-33%) Tunneling: Yes Position (o'clock): 11 Maximum Distance: (cm) 4.5 Undermining: No Wound Description Classification: Full Thickness Without Exposed Support Structures Wound Margin: Distinct, outline attached Exudate Amount: Medium Exudate Type: Serosanguineous Exudate Color: red, brown Foul Odor After Cleansing: No Slough/Fibrino No Wound Bed Granulation Amount: Large (67-100%) Exposed Structure Granulation Quality: Red Fascia Exposed: No Necrotic Amount: None Present (0%) Fat Layer (Subcutaneous Tissue) Exposed: Yes Tendon Exposed: No Muscle  Exposed: No Joint Exposed: No Bone Exposed: No Treatment Notes Wound #1 (Lower Leg) Wound Laterality: Left, Lateral Cleanser Wound Cleanser Discharge Instruction: Cleanse the wound with wound cleanser prior to applying a clean dressing using gauze sponges, not tissue or cotton balls. Peri-Wound Care Ketoconazole Cream 2% Discharge Instruction: Apply Ketoconazole as directed mix with TCA in clinic only. Triamcinolone 15 (g) Discharge Instruction: Use triamcinolone apply only in clinic today with lotion to periwound. Sween Lotion (Moisturizing lotion) Discharge Instruction: Apply moisturizing lotion to left leg with dressing. Topical Primary Dressing Dakin's Solution 0.25%, 16 (oz) Discharge Instruction: Moisten gauze with Dakin's in clinic only. Secondary Dressing ABD Pad, 5x9 Discharge Instruction: Apply over primary dressing in clinic only. Secured With [employer], 4.5x3.1 (in/yd) Discharge Instruction: Secure with Kerlix in clinic. ace wrap Discharge Instruction: only in clinic. Compression Wrap Compression Stockings Add-Ons Electronic Signature(s) Signed: 04/05/2022 4:22:40 PM By: Deon Pilling RN, BSN Entered By: Deon Pilling on 04/05/2022 13:08:22 -------------------------------------------------------------------------------- Vitals Details Patient Name: Date of Service: [name], Christine [name] 04/05/2022 12:45 PM Medical Record Number: [id number] Patient Account Number: [id number] Date of Birth/Sex: Treating RN: 1951/05/29 (71 y.o. [name], Meta.Reding Primary Care Jamonte Curfman: Deland Pretty Other Clinician: Referring Matty Vanroekel: Treating Mea Ozga/Extender: Judie Grieve in Treatment: 19 Vital Signs Time Taken: 12:45 Temperature (F): 97.8 Height (in): 67 Pulse (bpm): 54 Weight (lbs): 340 Respiratory Rate (breaths/min): 18 Body Mass Index (BMI): 53.2 Blood Pressure (mmHg): 151/87 Reference Range: 80 - 120 mg / dl Electronic  Signature(s) Signed: 04/05/2022 4:48:52 PM By: Erenest Blank Entered By: Barbarann Ehlers,  Joelene Millin on 04/05/2022 12:46:15

## 2022-04-12 ENCOUNTER — Other Ambulatory Visit: Payer: Self-pay | Admitting: Internal Medicine

## 2022-04-12 DIAGNOSIS — I480 Paroxysmal atrial fibrillation: Secondary | ICD-10-CM

## 2022-04-12 NOTE — Telephone Encounter (Signed)
Prescription refill request for Eliquis received. Indication: PAF Last office visit: 09/21/21  Odessa Fleming MD Scr: 0.99 on 01/07/22 Age:  71 Weight: 153.8kg  Based on above findings Eliquis 5mg  twice daily is the appropriate dose.  Refill approved.

## 2022-04-12 NOTE — Telephone Encounter (Signed)
Refill request

## 2022-04-14 ENCOUNTER — Encounter (HOSPITAL_BASED_OUTPATIENT_CLINIC_OR_DEPARTMENT_OTHER): Payer: PPO | Admitting: Internal Medicine

## 2022-04-14 DIAGNOSIS — Z7901 Long term (current) use of anticoagulants: Secondary | ICD-10-CM

## 2022-04-14 DIAGNOSIS — T798XXA Other early complications of trauma, initial encounter: Secondary | ICD-10-CM

## 2022-04-14 DIAGNOSIS — L97822 Non-pressure chronic ulcer of other part of left lower leg with fat layer exposed: Secondary | ICD-10-CM

## 2022-04-14 DIAGNOSIS — I480 Paroxysmal atrial fibrillation: Secondary | ICD-10-CM | POA: Diagnosis not present

## 2022-04-14 NOTE — Progress Notes (Signed)
CARY, WILFORD ([id number]) Visit Report for 04/14/2022 Arrival Information Details Patient Name: Date of Service: [name] 04/14/2022 12:45 PM Medical Record Number: [id number] Patient Account Number: [id number] Date of Birth/Sex: Treating RN: March 13, 1951 (71 y.o. [name], Tammi Klippel Primary Care Temisha Murley: Deland Pretty Other Clinician: Referring Nell Gales: Treating Ausha Sieh/Extender: Judie Grieve in Treatment: 20 Visit Information History Since Last Visit Added or deleted any medications: No Patient Arrived: [name] Any new allergies or adverse reactions: No Arrival Time: 12:50 Had a fall or experienced change in No Accompanied By: son activities of daily living that may affect Transfer Assistance: None risk of falls: Patient Identification Verified: Yes Signs or symptoms of abuse/neglect since last visito No Secondary Verification Process Completed: Yes Hospitalized since last visit: No Patient Requires Transmission-Based Precautions: No Implantable device outside of the clinic excluding No Patient Has Alerts: Yes cellular tissue based products placed in the center Patient Alerts: Patient on Blood Thinner since last visit: Has Dressing in Place as Prescribed: Yes Pain Present Now: No Electronic Signature(s) Signed: 04/14/2022 3:54:54 PM By: Deon Pilling RN, BSN Entered By: Deon Pilling on 04/14/2022 12:52:28 -------------------------------------------------------------------------------- Clinic Level of Care Assessment Details Patient Name: Date of Service: [name] 04/14/2022 12:45 PM Medical Record Number: [id number] Patient Account Number: [id number] Date of Birth/Sex: Treating RN: [birth year] (71 y.o. [name], Meta.Reding Primary Care Gaelyn Tukes: Deland Pretty Other Clinician: Referring Yarah Fuente: Treating Cherelle Midkiff/Extender: Judie Grieve in Treatment: 20 Clinic Level of Care Assessment Items TOOL 4  Quantity Score X- 1 0 Use when only an EandM is performed on FOLLOW-UP visit ASSESSMENTS - Nursing Assessment / Reassessment X- 1 10 Reassessment of Co-morbidities (includes updates in patient status) X- 1 5 Reassessment of Adherence to Treatment Plan ASSESSMENTS - Wound and Skin A ssessment / Reassessment X - Simple Wound Assessment / Reassessment - one wound 1 5 []  - 0 Complex Wound Assessment / Reassessment - multiple wounds X- 1 10 Dermatologic / Skin Assessment (not related to wound area) ASSESSMENTS - Focused Assessment X- 1 5 Circumferential Edema Measurements - multi extremities []  - 0 Nutritional Assessment / Counseling / Intervention []  - 0 Lower Extremity Assessment (monofilament, tuning fork, pulses) []  - 0 Peripheral Arterial Disease Assessment (using hand held doppler) ASSESSMENTS - Ostomy and/or Continence Assessment and Care []  - 0 Incontinence Assessment and Management []  - 0 Ostomy Care Assessment and Management (repouching, etc.) PROCESS - Coordination of Care X - Simple Patient / Family Education for ongoing care 1 15 []  - 0 Complex (extensive) Patient / Family Education for ongoing care X- 1 10 Staff obtains Programmer, systems, Records, T Results / Process Orders est []  - 0 Staff telephones HHA, Nursing Homes / Clarify orders / etc []  - 0 Routine Transfer to another Facility (non-emergent condition) []  - 0 Routine Hospital Admission (non-emergent condition) []  - 0 New Admissions / Biomedical engineer / Ordering NPWT Apligraf, etc. , []  - 0 Emergency Hospital Admission (emergent condition) X- 1 10 Simple Discharge Coordination []  - 0 Complex (extensive) Discharge Coordination PROCESS - Special Needs []  - 0 Pediatric / Minor Patient Management []  - 0 Isolation Patient Management []  - 0 Hearing / Language / Visual special needs []  - 0 Assessment of Community assistance (transportation, D/C planning, etc.) []  - 0 Additional assistance / Altered  mentation []  - 0 Support Surface(s) Assessment (bed, cushion, seat, etc.) INTERVENTIONS - Wound Cleansing / Measurement X - Simple Wound Cleansing - one wound 1 5 []  -  0 Complex Wound Cleansing - multiple wounds X- 1 5 Wound Imaging (photographs - any number of wounds) []  - 0 Wound Tracing (instead of photographs) X- 1 5 Simple Wound Measurement - one wound []  - 0 Complex Wound Measurement - multiple wounds INTERVENTIONS - Wound Dressings X - Small Wound Dressing one or multiple wounds 1 10 []  - 0 Medium Wound Dressing one or multiple wounds []  - 0 Large Wound Dressing one or multiple wounds []  - 0 Application of Medications - topical []  - 0 Application of Medications - injection INTERVENTIONS - Miscellaneous []  - 0 External ear exam []  - 0 Specimen Collection (cultures, biopsies, blood, body fluids, etc.) []  - 0 Specimen(s) / Culture(s) sent or taken to Lab for analysis []  - 0 Patient Transfer (multiple staff / Civil Service fast streamer / Similar devices) []  - 0 Simple Staple / Suture removal (25 or less) []  - 0 Complex Staple / Suture removal (26 or more) []  - 0 Hypo / Hyperglycemic Management (close monitor of Blood Glucose) []  - 0 Ankle / Brachial Index (ABI) - do not check if billed separately X- 1 5 Vital Signs Has the patient been seen at the hospital within the last three years: Yes Total Score: 100 Level Of Care: New/Established - Level 3 Electronic Signature(s) Signed: 04/14/2022 3:54:54 PM By: Deon Pilling RN, BSN Entered By: Deon Pilling on 04/14/2022 13:14:04 -------------------------------------------------------------------------------- Encounter Discharge Information Details Patient Name: Date of Service: [name]. 04/14/2022 12:45 PM Medical Record Number: [id number] Patient Account Number: [id number] Date of Birth/Sex: Treating RN: 1950/10/20 (71 y.o. [name] Primary Care Sierah Lacewell: Deland Pretty Other Clinician: Referring  Sarajean Dessert: Treating Sostenes Kauffmann/Extender: Judie Grieve in Treatment: 20 Encounter Discharge Information Items Discharge Condition: Stable Ambulatory Status: Walker Discharge Destination: Home Transportation: Private Auto Accompanied By: son Schedule Follow-up Appointment: Yes Clinical Summary of Care: Electronic Signature(s) Signed: 04/14/2022 3:54:54 PM By: Deon Pilling RN, BSN Entered By: Deon Pilling on 04/14/2022 13:14:27 -------------------------------------------------------------------------------- Lower Extremity Assessment Details Patient Name: Date of Service: [name] 04/14/2022 12:45 PM Medical Record Number: [id number] Patient Account Number: [id number] Date of Birth/Sex: Treating RN: 11-May-1951 (71 y.o. [name] Primary Care Ruey Storer: Deland Pretty Other Clinician: Referring Rosalie Buenaventura: Treating Sophie Tamez/Extender: Judie Grieve in Treatment: 20 Edema Assessment Assessed: Shirlyn Goltz: Yes] Patrice Paradise: No] Edema: [Left: Ye] [Right: s] Calf Left: Right: Point of Measurement: 31 cm From Medial Instep 45 cm Ankle Left: Right: Point of Measurement: 9 cm From Medial Instep 25 cm Vascular Assessment Pulses: Dorsalis Pedis Palpable: [Left:Yes] Electronic Signature(s) Signed: 04/14/2022 3:54:54 PM By: Deon Pilling RN, BSN Entered By: Deon Pilling on 04/14/2022 12:54:19 -------------------------------------------------------------------------------- Multi Wound Chart Details Patient Name: Date of Service: [name]. 04/14/2022 12:45 PM Medical Record Number: [id number] Patient Account Number: [id number] Date of Birth/Sex: Treating RN: Mar 23, 1951 (71 y.o. [name] Primary Care Chaz Mcglasson: Deland Pretty Other Clinician: Referring Yvaine Jankowiak: Treating Ember Henrikson/Extender: Judie Grieve in Treatment: 20 Vital Signs Height(in): 67 Pulse(bpm): 53 Weight(lbs): 340 Blood  Pressure(mmHg): 150/83 Body Mass Index(BMI): 53.2 Temperature(F): 97.8 Respiratory Rate(breaths/min): 20 Photos: [N/A:N/A] Left, Lateral Lower Leg N/A N/A Wound Location: Trauma N/A N/A Wounding Event: Trauma, Other N/A N/A Primary Etiology: Congestive Heart Failure, N/A N/A Comorbid History: Hypertension 10/18/2021 N/A N/A Date Acquired: 20 N/A N/A Weeks of Treatment: Open N/A N/A Wound Status: No N/A N/A Wound Recurrence: Yes N/A N/A Clustered Wound: 2 N/A N/A Clustered Quantity: 2x0.5x0.5 N/A N/A Measurements L  x W x D (cm) 0.785 N/A N/A A (cm) : rea 0.393 N/A N/A Volume (cm) : 99.60% N/A N/A % Reduction in A rea: 100.00% N/A N/A % Reduction in Volume: 11 Position 1 (o'clock): 3.7 Maximum Distance 1 (cm): Yes N/A N/A Tunneling: Full Thickness Without Exposed N/A N/A Classification: Support Structures Medium N/A N/A Exudate Amount: Serosanguineous N/A N/A Exudate Type: red, brown N/A N/A Exudate Color: Distinct, outline attached N/A N/A Wound Margin: Large (67-100%) N/A N/A Granulation Amount: Red N/A N/A Granulation Quality: None Present (0%) N/A N/A Necrotic Amount: Fat Layer (Subcutaneous Tissue): Yes N/A N/A Exposed Structures: Fascia: No Tendon: No Muscle: No Joint: No Bone: No Large (67-100%) N/A N/A Epithelialization: Treatment Notes Electronic Signature(s) Signed: 04/14/2022 1:19:07 PM By: Kalman Shan DO Signed: 04/14/2022 3:54:54 PM By: Deon Pilling RN, BSN Entered By: Kalman Shan on 04/14/2022 13:13:44 -------------------------------------------------------------------------------- Multi-Disciplinary Care Plan Details Patient Name: Date of Service: [name]. 04/14/2022 12:45 PM Medical Record Number: [id number] Patient Account Number: [id number] Date of Birth/Sex: Treating RN: 1950-10-07 (71 y.o. [name], Meta.Reding Primary Care Margrete Delude: Deland Pretty Other Clinician: Referring Kaylia Winborne: Treating  Eulis Salazar/Extender: Judie Grieve in Treatment: 20 Active Inactive Abuse / Safety / Falls / Self Care Management Nursing Diagnoses: History of Falls Potential for falls Goals: Patient/caregiver will verbalize/demonstrate measure taken to improve self care Date Initiated: 11/19/2021 Date Inactivated: 04/05/2022 Target Resolution Date: 04/21/2022 Goal Status: Met Patient/caregiver will verbalize/demonstrate measures taken to prevent injury and/or falls Date Initiated: 11/19/2021 Target Resolution Date: 05/06/2022 Goal Status: Active Interventions: Provide education on basic hygiene Provide education on fall prevention Provide education on HBO safety Provide education on vaccinations Notes: Pain, Acute or Chronic Nursing Diagnoses: Pain, acute or chronic: actual or potential Potential alteration in comfort, pain Goals: Patient will verbalize adequate pain control and receive pain control interventions during procedures as needed Date Initiated: 11/19/2021 Date Inactivated: 04/05/2022 Target Resolution Date: 04/22/2022 Goal Status: Met Patient/caregiver will verbalize comfort level met Date Initiated: 11/19/2021 Target Resolution Date: 04/22/2022 Goal Status: Active Interventions: Encourage patient to take pain medications as prescribed Provide education on pain management Reposition patient for comfort Treatment Activities: Administer pain control measures as ordered : 11/19/2021 Notes: Electronic Signature(s) Signed: 04/14/2022 3:54:54 PM By: Deon Pilling RN, BSN Entered By: Deon Pilling on 04/14/2022 13:00:31 -------------------------------------------------------------------------------- Pain Assessment Details Patient Name: Date of Service: [name]. 04/14/2022 12:45 PM Medical Record Number: [id number] Patient Account Number: [id number] Date of Birth/Sex: Treating RN: July 10, 1951 (71 y.o. [name] Primary Care Oyuki Hogan: Deland Pretty Other Clinician: Referring Joscelyn Hardrick: Treating Natallie Ravenscroft/Extender: Judie Grieve in Treatment: 20 Active Problems Location of Pain Severity and Description of Pain Patient Has Paino No Site Locations Rate the pain. Current Pain Level: 0 Pain Management and Medication Current Pain Management: Medication: No Cold Application: No Rest: No Massage: No Activity: No T.E.N.S.: No Heat Application: No Leg drop or elevation: No Is the Current Pain Management Adequate: Adequate How does your wound impact your activities of daily livingo Sleep: No Bathing: No Appetite: No Relationship With Others: No Bladder Continence: No Emotions: No Bowel Continence: No Work: No Toileting: No Drive: No Dressing: No Hobbies: No Engineer, maintenance) Signed: 04/14/2022 3:54:54 PM By: Deon Pilling RN, BSN Entered By: Deon Pilling on 04/14/2022 12:52:46 -------------------------------------------------------------------------------- Patient/Caregiver Education Details Patient Name: Date of Service: [name] 8/24/2023andnbsp12:45 PM Medical Record Number: [id number] Patient Account Number: [id number] Date of Birth/Gender: Treating RN: May 04, 1951 (71 y.o.  [name] Primary Care Physician: Deland Pretty Other Clinician: Referring Physician: Treating Physician/Extender: Judie Grieve in Treatment: 20 Education Assessment Education Provided To: Patient Education Topics Provided Tissue Oxygenation: Handouts: Skin Perfusion Tests Methods: Explain/Verbal Responses: Reinforcements needed Electronic Signature(s) Signed: 04/14/2022 3:54:54 PM By: Deon Pilling RN, BSN Entered By: Deon Pilling on 04/14/2022 13:00:42 -------------------------------------------------------------------------------- Wound Assessment Details Patient Name: Date of Service: [name]. 04/14/2022 12:45 PM Medical Record Number:  [id number] Patient Account Number: [id number] Date of Birth/Sex: Treating RN: 1951-01-07 (71 y.o. [name], Meta.Reding Primary Care Raheel Kunkle: Deland Pretty Other Clinician: Referring Majestic Molony: Treating Kalib Bhagat/Extender: Judie Grieve in Treatment: 20 Wound Status Wound Number: 1 Primary Etiology: Trauma, Other Wound Location: Left, Lateral Lower Leg Wound Status: Open Wounding Event: Trauma Comorbid History: Congestive Heart Failure, Hypertension Date Acquired: 10/18/2021 Weeks Of Treatment: 20 Clustered Wound: Yes Photos Wound Measurements Length: (cm) 2 Width: (cm) 0.5 Depth: (cm) 0.5 Clustered Quantity: 2 Area: (cm) 0.785 Volume: (cm) 0.393 % Reduction in Area: 99.6% % Reduction in Volume: 100% Epithelialization: Large (67-100%) Tunneling: Yes Position (o'clock): 11 Maximum Distance: (cm) 3.7 Undermining: No Wound Description Classification: Full Thickness Without Exposed Support Structures Wound Margin: Distinct, outline attached Exudate Amount: Medium Exudate Type: Serosanguineous Exudate Color: red, brown Foul Odor After Cleansing: No Slough/Fibrino No Wound Bed Granulation Amount: Large (67-100%) Exposed Structure Granulation Quality: Red Fascia Exposed: No Necrotic Amount: None Present (0%) Fat Layer (Subcutaneous Tissue) Exposed: Yes Tendon Exposed: No Muscle Exposed: No Joint Exposed: No Bone Exposed: No Treatment Notes Wound #1 (Lower Leg) Wound Laterality: Left, Lateral Cleanser Wound Cleanser Discharge Instruction: Cleanse the wound with wound cleanser prior to applying a clean dressing using gauze sponges, not tissue or cotton balls. Peri-Wound Care Skin Prep Discharge Instruction: Use skin prep as directed Topical Primary Dressing Promogran Prisma Matrix, 4.34 (sq in) (silver collagen) Discharge Instruction: Moisten collagen with saline or hydrogel lightly pack into wound bed. Secondary Dressing Woven Gauze Sponge,  Non-Sterile 4x4 in Discharge Instruction: Apply over primary dressing as directed. Zetuvit Plus Silicone Border Dressing 5x5 (in/in) Discharge Instruction: Apply silicone border over primary dressing as directed. Secured With Compression Wrap Compression Stockings Environmental education officer) Signed: 04/14/2022 3:54:54 PM By: Deon Pilling RN, BSN Entered By: Deon Pilling on 04/14/2022 13:00:11 -------------------------------------------------------------------------------- Vitals Details Patient Name: Date of Service: [name] 04/14/2022 12:45 PM Medical Record Number: [id number] Patient Account Number: [id number] Date of Birth/Sex: Treating RN: 1951/07/23 (71 y.o. [name], Meta.Reding Primary Care Shoshannah Faubert: Deland Pretty Other Clinician: Referring Lisbeth Puller: Treating Kairee Isa/Extender: Judie Grieve in Treatment: 20 Vital Signs Time Taken: 12:50 Temperature (F): 97.8 Height (in): 67 Pulse (bpm): 62 Weight (lbs): 340 Respiratory Rate (breaths/min): 20 Body Mass Index (BMI): 53.2 Blood Pressure (mmHg): 150/83 Reference Range: 80 - 120 mg / dl Electronic Signature(s) Signed: 04/14/2022 3:54:54 PM By: Deon Pilling RN, BSN Entered By: Deon Pilling on 04/14/2022 12:52:40

## 2022-04-14 NOTE — Progress Notes (Signed)
Christine, [name] ([id number]) Visit Report for 04/14/2022 Chief Complaint Document Details Patient Name: Date of Service: [name] 04/14/2022 12:45 PM Medical Record Number: [id number] Patient Account Number: [id number] Date of Birth/Sex: Treating RN: 16-Apr-1951 (71 y.o. [name] Primary Care Provider: [name] Other Clinician: Referring Provider: Treating Provider/Extender: Annamary Rummage in Treatment: 20 Information Obtained from: Patient Chief Complaint 11/19/2021; Left lower extremity wound status post fall Electronic Signature(s) Signed: 04/14/2022 1:19:07 PM By: Geralyn Corwin DO Entered By: Geralyn Corwin on 04/14/2022 13:14:16 -------------------------------------------------------------------------------- HPI Details Patient Name: Date of Service: [name]. 04/14/2022 12:45 PM Medical Record Number: [id number] Patient Account Number: [id number] Date of Birth/Sex: Treating RN: [birth year] (71 y.o. [name] Primary Care Provider: [name] Other Clinician: Referring Provider: Treating Provider/Extender: Annamary Rummage in Treatment: 20 History of Present Illness HPI Description: Admission 11/19/2021 [name] is a 71 year old female with a past medical history of paroxysmal A-fib on Eliquis, hypothyroidism, major depressive disorder, venous insufficiency and chronic diastolic heart failure that presents to the clinic for a 1 month history of nonhealing wound to the left lower extremity. She visited the ED on 10/18/2021 after a mechanical fall. She developed a hematoma that subsequently opened. She was hospitalized for 7 days and discharged on 10/25/2021. She has been on several different antibiotics For the past month. She states that most recently she was on Levaquin and linezolid for the past week. She states she completes her antibiotic course tomorrow. She has been using  Dakin's wet-to-dry dressings to the wound bed. She denies signs of infection. 4/7; patient presents for follow-up. She has been using Dakin's wet-to-dry dressings. She did end up going to the ED on 4/1 because she had excess bleeding with dressing change that she could not stop. She is on Eliquis for A-fib. In the ED they tied off a small artery. She has had no issues since discharge. She denies signs of infection. 4/14; this is a very difficult clinical situation. A patient with underlying chronic venous insufficiency and lymphedema very significant lower extremity edema had a hematoma after a fall on her left upper lateral lower leg. She is on Eliquis for atrial fibrillation apparently with a history of a splenic infarct following with Dr. Berton Mount of cardiology. She has exhibited significant bleeding from the wound surface including ao Venous bleeder that required suturing short while ago. She has been using Dakin's wet-to-dry packing and over the surface of the wound. She saw Dr. Graciela Husbands yesterday he is reluctant to consider stopping the Eliquis because of the prior history of presumed cardioembolism. Wants to communicate with Dr. Mikey Bussing when she returns. In a perfect world where she was not on Eliquis she requires a wound VAC with additional compression wraps but I understand the reluctance to do this because of the concerns of bleeding 4/28; the patient's wound actually looks better today using Dakin's wet-to-dry that she is changing twice a day she is wrapping this with Kerlix and Ace wrapping. She tells me she had 2 small bleeding areas which were part of the superficial wound that stopped this week with direct pressure. Other than that no major issues. In follow-up from discussion of last week Dr. 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 The bigger question  from my point of view is the wound VAC issue. As far as she knows and her daughter-in-law verifies that she has not had any bleeding from the deeper parts of the wound although the bleeding has been superficial including the one that sent her to the ER for stitches. She is concerned that a wound VAC would cause further bleeding and I cannot completely allay those concerns. 5/8; patient presents for follow-up. She has no issues or complaints today. She has been using Dakin's wet-to-dry dressings without issues. She denies signs of infection. 5/12; patient presents for follow-up. She continues to use Dakin's wet-to-dry dressings without issues. She denies signs of infection. She reports some issues with bleeding at times but this has improved. She denies signs of infection. 6/1; patient presents for follow-up. She was recently hospitalized for upper left leg thigh cellulitis. She was given IV cefepime and vancomycin and discharged on oral antibiotics. She has been using Dakin's wet-to-dry dressings to the left lower leg wound. She reports improvement in healing. She currently denies systemic signs of infection. 6/7; patient is using Dakin's wet-to-dry twice daily. In general this looks better than when I saw this a month or so ago however still considerable depth to the tunnel in her left leg. She is going for iron infusions ordered by her primary care doctor I believe 6/15; patient presents for follow-up. She has been using Dakin's wet-to-dry dressings. She has noted some scattered small areas that blister up and heal on her left lower leg. She has been using mupirocin ointment on them. Nothing open today. 6/23; patient's been using Dakin's wet-to-dry dressings to the tunneled wound and Hydrofera Blue to the opening. She has no issues or complaints today. 6/29; patient presents for follow-up. She has been using Dakin's wet-to-dry dressings to the tunneled wound  and Hydrofera Blue to the opening. She states that [facility] is sticking to the wound bed. She denies signs of infection. 7/7; patient presents for follow-up. She has been using Dakin's wet-to-dry dressings to the tunneled wound and PolyMem silver to the opening without issues. 7/13; patient presents for follow-up. She has been using PolyMem silver to the opening and the rope to the tunnel. She has no issues or complaints today. She denies signs of infection. 7/20; patient presents for follow-up. We have been using PolyMem silver to the wound bed. She has no issues or complaints today. She is receiving her custom compression garments tomorrow. 7/27; patient presents for follow-up. She has been using PolyMem silver to the wound bed. She is having her custom compression garments adjusted as these are not staying on very easily. 8/30; patient presents for follow-up. We have been using PolyMem silver to the wound bed. She is still waiting on her custom compression garments to be ordered. She denies signs of infection. 8/11; patient presents for follow-up. The wound VAC has been started and patient has been using this for the past week. Patient has home health who changes the wound VAC. She developed irritation to the periwound. DuoDERM was not being used to the periwound despite being ordered. 8/15; patient presents for follow-up. She restarted the wound VAC and has had improvement to the periwound with the use of DuoDERM. 8/24; patient presents for follow-up. She has been using the wound VAC. She has developed a wound just superior to the original wound. Likely as a result from the wound VAC. She denies signs of infection. Electronic Signature(s) Signed: 04/14/2022 1:19:07 PM By: Geralyn Corwin DO Entered By: Mikey Bussing,  Shanda Bumps on 04/14/2022 13:14:58 -------------------------------------------------------------------------------- Physical Exam Details Patient Name: Date of Service: [name] 04/14/2022 12:45 PM Medical Record Number: [id number] Patient Account Number: [id number] Date of Birth/Sex: Treating RN: [birth year] (71 y.o. [name] Primary Care Provider: [name] Other Clinician: Referring Provider: Treating Provider/Extender: Annamary Rummage in Treatment: 20 Constitutional respirations regular, non-labored and within target range for patient.. Cardiovascular 2+ dorsalis pedis/posterior tibialis pulses. Psychiatric pleasant and cooperative. Notes Left lower extremity: Open wound with granulation tissue to the opening in tunneling to the 11 o'clock position. Just superior to this is a small wound with granulation tissue present. No signs of surrounding soft tissue infection. Electronic Signature(s) Signed: 04/14/2022 1:19:07 PM By: Geralyn Corwin DO Entered By: Geralyn Corwin on 04/14/2022 13:16:23 -------------------------------------------------------------------------------- Physician Orders Details Patient Name: Date of Service: [name]. 04/14/2022 12:45 PM Medical Record Number: [id number] Patient Account Number: [id number] Date of Birth/Sex: Treating RN: [birth year] (71 y.o. [name] Primary Care Provider: [name] Other Clinician: Referring Provider: Treating Provider/Extender: Annamary Rummage in Treatment: 20 Verbal / Phone Orders: No Diagnosis Coding ICD-10 Coding Code Description [phone] Non-pressure chronic ulcer of other part of left lower leg with fat layer exposed T79.8XXA Other early complications of trauma, initial encounter I48.0 Paroxysmal atrial fibrillation Z79.01 Long term (current) use of anticoagulants Follow-up Appointments ppointment in 1 week. - Dr. Mikey Bussing and [place], Room 8 04/19/2022 1245pm Tuesday Return A ppointment in 2 weeks. - Dr. Mikey Bussing and [place], Room 04/28/2022 130pm Thursday Return A Other: - Continue to wear compression  stockings daily. Medical Modalities wound vac. HOLD VAC THIS WEEK. Anesthetic (In clinic) Topical Lidocaine 5% applied to wound bed Bathing/ Shower/ Hygiene May shower with protection but do not get wound dressing(s) wet. Negative Presssure Wound Therapy Wound Vac to wound continuously at 133mm/hg pressure - home health to apply duoderm under drape to periwound where skin irritation is noted. ****HOLD WOUND VAC THIS WEEK.**** Black and White Foam combination - Lightly pack white foam into the tunnel and undermining. Home health change three times a week Monday, Wednesday, and Friday. Wound center will apply dakin's wet to dry in clinic. ****HOLD WOUND VAC THIS WEEK.**** Edema Control - Lymphedema / SCD / Other Elevate legs to the level of the heart or above for 30 minutes daily and/or when sitting, a frequency of: - 3-4 times a day throughout the day. Avoid standing for long periods of time. Moisturize legs daily. - both legs every night before bed. Compression stocking or Garment 20-30 mm/Hg pressure to: - patient wear in the morning and remove at night. no ace wrap when wearing. Home Health New wound care orders this week; continue Home Health for wound care. May utilize formulary equivalent dressing for wound treatment orders unless otherwise specified. - ***HOLD WOUND VAC THIS WEEK.**** Lightly pack area with collagen Ag, gauze and bordered foam. Change every other day. Other Home Health Orders/Instructions: - [facility] Wound Treatment Wound #1 - Lower Leg Wound Laterality: Left, Lateral Cleanser: Wound Cleanser (Home Health) Every Other Day/30 Days Discharge Instructions: Cleanse the wound with wound cleanser prior to applying a clean dressing using gauze sponges, not tissue or cotton balls. Peri-Wound Care: Skin Prep [facility]) Every Other Day/30 Days Discharge Instructions: Use skin prep as directed Prim Dressing: Promogran Prisma Matrix, 4.34 (sq in) (silver collagen) (Home  Health) Every Other Day/30 Days ary Discharge Instructions: Moisten collagen with saline or hydrogel lightly pack into wound  bed. Secondary Dressing: Woven Gauze Sponge, Non-Sterile 4x4 in [facility]) Every Other Day/30 Days Discharge Instructions: Apply over primary dressing as directed. Secondary Dressing: Zetuvit Plus Silicone Border Dressing 5x5 (in/in) (Home Health) Every Other Day/30 Days Discharge Instructions: Apply silicone border over primary dressing as directed. Electronic Signature(s) Signed: 04/14/2022 1:19:07 PM By: Geralyn Corwin DO Entered By: Geralyn Corwin on 04/14/2022 13:16:42 -------------------------------------------------------------------------------- Problem List Details Patient Name: Date of Service: [name]. 04/14/2022 12:45 PM Medical Record Number: [id number] Patient Account Number: [id number] Date of Birth/Sex: Treating RN: 04-10-1951 (71 y.o. 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 04/14/2022 12:45 PM Medical Record Number: [id number] Patient Account Number: [id number] Date of Birth/Sex: Treating  RN: 1950-12-13 (71 y.o. [name] Primary Care Provider: [name] Other Clinician: Referring Provider: Treating Provider/Extender: Annamary Rummage in Treatment: 20 Subjective Chief Complaint Information obtained from Patient 11/19/2021; Left lower extremity wound status post fall History of Present Illness (HPI) Admission 11/19/2021 Ms. Tekisha Darcey is a 71 year old female with a past medical history of paroxysmal A-fib on Eliquis, hypothyroidism, major depressive disorder, venous insufficiency and chronic diastolic heart failure that presents to the clinic for a 1 month history of nonhealing wound to the left lower extremity. She visited the ED on 10/18/2021 after a mechanical fall. She developed a hematoma that subsequently opened. She was hospitalized for 7 days and discharged on 10/25/2021. She has been on several different antibiotics For the past month. She states that most recently she was on Levaquin and linezolid for the past week. She states she completes her antibiotic course tomorrow. She has been using Dakin's wet-to-dry dressings to the wound bed. She denies signs of infection. 4/7; patient presents for follow-up. She has been using Dakin's wet-to-dry dressings. She did end up going to the ED on 4/1 because she had excess bleeding with dressing change that she could not stop. She is on Eliquis for A-fib. In the ED they tied off a small artery. She has had no issues since discharge. She denies signs of infection. 4/14; this is a very difficult clinical situation. A patient with underlying chronic venous insufficiency and lymphedema very significant lower extremity edema had a hematoma after a fall on her left upper lateral lower leg. She is on Eliquis for atrial fibrillation apparently with a history of a splenic infarct following with Dr. Berton Mount of cardiology. She has exhibited significant bleeding from the wound surface including ao Venous  bleeder that required suturing short while ago. She has been using Dakin's wet-to-dry packing and over the surface of the wound. She saw Dr. Graciela Husbands yesterday he is reluctant to consider stopping the Eliquis because of the prior history of presumed cardioembolism. Wants to communicate with Dr. Mikey Bussing when she returns. In a perfect world where she was not on Eliquis she requires a wound VAC with additional compression wraps but  I understand the reluctance to do this because of the concerns of bleeding 4/28; the patient's wound actually looks better today using Dakin's wet-to-dry that she is changing twice a day she is wrapping this with Kerlix and Ace wrapping. She tells me she had 2 small bleeding areas which were part of the superficial wound that stopped this week with direct pressure. Other than that no major issues. In follow-up from discussion of last week Dr. 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 The bigger question from my point of view is the wound VAC issue. As far as she knows and her daughter-in-law verifies that she has not had any bleeding from the deeper parts of the wound although the bleeding has been superficial including the one that sent her to the ER for stitches. She is concerned that a wound VAC would cause further bleeding and I cannot completely allay those concerns. 5/8; patient presents for follow-up. She has no issues or complaints today. She has been using Dakin's wet-to-dry dressings without issues. She denies signs of infection. 5/12; patient presents for follow-up. She continues to use Dakin's wet-to-dry dressings without issues. She denies signs of infection. She reports some issues with bleeding at times but this has improved. She denies signs of infection. 6/1; patient presents for follow-up. She  was recently hospitalized for upper left leg thigh cellulitis. She was given IV cefepime and vancomycin and discharged on oral antibiotics. She has been using Dakin's wet-to-dry dressings to the left lower leg wound. She reports improvement in healing. She currently denies systemic signs of infection. 6/7; patient is using Dakin's wet-to-dry twice daily. In general this looks better than when I saw this a month or so ago however still considerable depth to the tunnel in her left leg. She is going for iron infusions ordered by her primary care doctor I believe 6/15; patient presents for follow-up. She has been using Dakin's wet-to-dry dressings. She has noted some scattered small areas that blister up and heal on her left lower leg. She has been using mupirocin ointment on them. Nothing open today. 6/23; patient's been using Dakin's wet-to-dry dressings to the tunneled wound and Hydrofera Blue to the opening. She has no issues or complaints today. 6/29; patient presents for follow-up. She has been using Dakin's wet-to-dry dressings to the tunneled wound and Hydrofera Blue to the opening. [facility] wound bed. She denies signs of infection. 7/7; patient presents for follow-up. She has been using Dakin's wet-to-dry dressings to the tunneled wound and PolyMem silver to the opening without issues. 7/13; patient presents for follow-up. She has been using PolyMem silver to the opening and the rope to the tunnel. She has no issues or complaints today. She denies signs of infection. 7/20; patient presents for follow-up. We have been using PolyMem silver to the wound bed. She has no issues or complaints today. She is receiving her custom compression garments tomorrow. 7/27; patient presents for follow-up. She has been using PolyMem silver to the wound bed. She is having her custom compression garments adjusted as these are not staying on very easily. 8/30; patient  presents for follow-up. We have been using PolyMem silver to the wound bed. She is still waiting on her custom compression garments to be ordered. She denies signs of infection.  8/11; patient presents for follow-up. The wound VAC has been started and patient has been using this for the past week. Patient has home health who changes the wound VAC. She developed irritation to the periwound. DuoDERM was not being used to the periwound despite being ordered. 8/15; patient presents for follow-up. She restarted the wound VAC and has had improvement to the periwound with the use of DuoDERM. 8/24; patient presents for follow-up. She has been using the wound VAC. She has developed a wound just superior to the original wound. Likely as a result from the wound VAC. She denies signs of infection. Patient History Medical History Cardiovascular Patient has history of Congestive Heart Failure, Hypertension Hospitalization/Surgery History - Cellulitis left leg- 01/03/2022-01/07/2022. Medical A Surgical History Notes nd Constitutional Symptoms (General Health) Infarction of spleen Hematologic/Lymphatic Hypothyroidism Cardiovascular A-Fib Gastrointestinal Gastroesophageal reflux Genitourinary Chronic kidney disease Musculoskeletal Osteoarthritis of knee Psychiatric Anxiety Objective Constitutional respirations regular, non-labored and within target range for patient.. Vitals Time Taken: 12:50 PM, Height: 67 in, Weight: 340 lbs, BMI: 53.2, Temperature: 97.8 F, Pulse: 62 bpm, Respiratory Rate: 20 breaths/min, Blood Pressure: 150/83 mmHg. Cardiovascular 2+ dorsalis pedis/posterior tibialis pulses. Psychiatric pleasant and cooperative. General Notes: Left lower extremity: Open wound with granulation tissue to the opening in tunneling to the 11 o'clock position. Just superior to this is a small wound with granulation tissue present. No signs of surrounding soft tissue infection. Integumentary (Hair,  Skin) Wound #1 status is Open. Original cause of wound was Trauma. The date acquired was: 10/18/2021. The wound has been in treatment 20 weeks. The wound is located on the Left,Lateral Lower Leg. The wound measures 2cm length x 0.5cm width x 0.5cm depth; 0.785cm^2 area and 0.393cm^3 volume. There is Fat Layer (Subcutaneous Tissue) exposed. There is no undermining noted, however, there is tunneling at 11:00 with a maximum distance of 3.7cm. There is a medium amount of serosanguineous drainage noted. The wound margin is distinct with the outline attached to the wound base. There is large (67-100%) red granulation within the wound bed. There is no necrotic tissue within the wound bed. Assessment Active Problems ICD-10 Non-pressure chronic ulcer of other part of left lower leg with fat layer exposed Other early complications of trauma, initial encounter Paroxysmal atrial fibrillation Long term (current) use of anticoagulants Patient's wound has shown improvement in size and appearance since last clinic visit. Unfortunately she has developed a small wound just superior to this. I suspect this is from the wound VAC. At this time I recommended a break from the wound VAC again. She can use collagen to both wound beds. Follow-up in 1 week. Plan Follow-up Appointments: Return Appointment in 1 week. - Dr. Mikey Bussing and [place], Room 8 04/19/2022 1245pm Tuesday Return Appointment in 2 weeks. - Dr. Mikey Bussing and [place], Room 04/28/2022 130pm Thursday Other: - Continue to wear compression stockings daily. Medical Modalities wound vac. HOLD VAC THIS WEEK. Anesthetic: (In clinic) Topical Lidocaine 5% applied to wound bed Bathing/ Shower/ Hygiene: May shower with protection but do not get wound dressing(s) wet. Negative Presssure Wound Therapy: Wound Vac to wound continuously at 160mm/hg pressure - home health to apply duoderm under drape to periwound where skin irritation is noted. ****HOLD WOUND VAC THIS  WEEK.**** Black and White Foam combination - Lightly pack white foam into the tunnel and undermining. Home health change three times a week Monday, Wednesday, and Friday. Wound center will apply dakin's wet to dry in clinic. ****HOLD WOUND VAC THIS WEEK.**** Edema Control - Lymphedema /  SCD / Other: Elevate legs to the level of the heart or above for 30 minutes daily and/or when sitting, a frequency of: - 3-4 times a day throughout the day. Avoid standing for long periods of time. Moisturize legs daily. - both legs every night before bed. Compression stocking or Garment 20-30 mm/Hg pressure to: - patient wear in the morning and remove at night. no ace wrap when wearing. Home Health: New wound care orders this week; continue Home Health for wound care. May utilize formulary equivalent dressing for wound treatment orders unless otherwise specified. - ***HOLD WOUND VAC THIS WEEK.**** Lightly pack area with collagen Ag, gauze and bordered foam. Change every other day. Other Home Health Orders/Instructions: - [facility] WOUND #1: - Lower Leg Wound Laterality: Left, Lateral Cleanser: Wound Cleanser (Home Health) Every Other Day/30 Days Discharge Instructions: Cleanse the wound with wound cleanser prior to applying a clean dressing using gauze sponges, not tissue or cotton balls. Peri-Wound Care: Skin Prep [facility]) Every Other Day/30 Days Discharge Instructions: Use skin prep as directed Prim Dressing: Promogran Prisma Matrix, 4.34 (sq in) (silver collagen) (Home Health) Every Other Day/30 Days ary Discharge Instructions: Moisten collagen with saline or hydrogel lightly pack into wound bed. Secondary Dressing: Woven Gauze Sponge, Non-Sterile 4x4 in [facility]) Every Other Day/30 Days Discharge Instructions: Apply over primary dressing as directed. Secondary Dressing: Zetuvit Plus Silicone Border Dressing 5x5 (in/in) (Home Health) Every Other Day/30 Days Discharge Instructions: Apply  silicone border over primary dressing as directed. 1. Collagen 2. Hold wound VAC 3. Follow-up in 1 week Electronic Signature(s) Signed: 04/14/2022 1:19:07 PM By: Geralyn Corwin DO Entered By: Geralyn Corwin on 04/14/2022 13:18:05 -------------------------------------------------------------------------------- HxROS Details Patient Name: Date of Service: Marthenia Rolling [name] 04/14/2022 12:45 PM Medical Record Number: [id number] Patient Account Number: [id number] Date of Birth/Sex: Treating RN: 1951-03-08 (71 y.o. [name] Primary Care Provider: [name] Other Clinician: Referring Provider: Treating Provider/Extender: Annamary Rummage in Treatment: 20 Constitutional Symptoms (General Health) Medical History: Past Medical History Notes: Infarction of spleen Hematologic/Lymphatic Medical History: Past Medical History Notes: Hypothyroidism Cardiovascular Medical History: Positive for: Congestive Heart Failure; Hypertension Past Medical History Notes: A-Fib Gastrointestinal Medical History: Past Medical History Notes: Gastroesophageal reflux Genitourinary Medical History: Past Medical History Notes: Chronic kidney disease Musculoskeletal Medical History: Past Medical History Notes: Osteoarthritis of knee Psychiatric Medical History: Past Medical History Notes: Anxiety Immunizations Pneumococcal Vaccine: Received Pneumococcal Vaccination: No Implantable Devices No devices added Hospitalization / Surgery History Type of Hospitalization/Surgery Cellulitis left leg- 01/03/2022-01/07/2022 Electronic Signature(s) Signed: 04/14/2022 1:19:07 PM By: Geralyn Corwin DO Signed: 04/14/2022 3:54:54 PM By: Shawn Stall RN, BSN Entered By: Geralyn Corwin on 04/14/2022 13:15:14 -------------------------------------------------------------------------------- SuperBill Details Patient Name: Date of Service: [name]  04/14/2022 Medical Record Number: [id number] Patient Account Number: [id number] Date of Birth/Sex: Treating RN: Aug 25, 1950 (71 y.o. [name] Primary Care Provider: [name] Other Clinician: Referring Provider: Treating Provider/Extender: Annamary Rummage in Treatment: 20 Diagnosis Coding ICD-10 Codes Code Description [phone] Non-pressure chronic ulcer of other part of left lower leg with fat layer exposed T79.8XXA Other early complications of trauma, initial encounter I48.0 Paroxysmal atrial fibrillation Z79.01 Long term (current) use of anticoagulants Facility Procedures CPT4 Code: [id number] Description: 99213 - WOUND CARE VISIT-LEV 3 EST PT Modifier: Quantity: 1 Physician Procedures : CPT4 Code Description Modifier [id number] 99213 - WC PHYS LEVEL 3 - EST PT ICD-10 Diagnosis Description L97.822 Non-pressure chronic ulcer of other part of  left lower leg with fat layer exposed T79.8XXA Other early complications of trauma, initial  encounter I48.0 Paroxysmal atrial fibrillation Z79.01 Long term (current) use of anticoagulants Quantity: 1 Electronic Signature(s) Signed: 04/14/2022 1:19:07 PM By: Geralyn Corwin DO Entered By: Geralyn Corwin on 04/14/2022 13:18:18

## 2022-04-19 ENCOUNTER — Encounter (HOSPITAL_BASED_OUTPATIENT_CLINIC_OR_DEPARTMENT_OTHER): Payer: PPO | Admitting: Internal Medicine

## 2022-04-19 DIAGNOSIS — T798XXA Other early complications of trauma, initial encounter: Secondary | ICD-10-CM | POA: Diagnosis not present

## 2022-04-19 DIAGNOSIS — I480 Paroxysmal atrial fibrillation: Secondary | ICD-10-CM | POA: Diagnosis not present

## 2022-04-19 DIAGNOSIS — L97822 Non-pressure chronic ulcer of other part of left lower leg with fat layer exposed: Secondary | ICD-10-CM | POA: Diagnosis not present

## 2022-04-19 DIAGNOSIS — Z7901 Long term (current) use of anticoagulants: Secondary | ICD-10-CM

## 2022-04-19 NOTE — Progress Notes (Signed)
[name] ([id number]) Visit Report for 04/19/2022 Chief Complaint Document Details Patient Name: Date of Service: [name] 04/19/2022 12:45 PM Medical Record Number: [id number] Patient Account Number: [id number] Date of Birth/Sex: Treating RN: 04-02-1951 (71 y.o. [name] Primary Care Provider: Deland Pretty Other Clinician: Referring Provider: Treating Provider/Extender: Judie Grieve in Treatment: 21 Information Obtained from: Patient Chief Complaint 11/19/2021; Left lower extremity wound status post fall Electronic Signature(s) Signed: 04/19/2022 1:21:05 PM By: Kalman Shan DO Entered By: Kalman Shan on 04/19/2022 13:10:12 -------------------------------------------------------------------------------- HPI Details Patient Name: Date of Service: [name]. 04/19/2022 12:45 PM Medical Record Number: [id number] Patient Account Number: [id number] Date of Birth/Sex: Treating RN: 09-23-1950 (71 y.o. [name] Primary Care Provider: Deland Pretty Other Clinician: Referring Provider: Treating Provider/Extender: Judie Grieve in Treatment: 21 History of Present Illness HPI Description: Admission 11/19/2021 [name] is a 71 year old female with a past medical history of paroxysmal A-fib on Eliquis, hypothyroidism, major depressive disorder, venous insufficiency and chronic diastolic heart failure that presents to the clinic for a 1 month history of nonhealing wound to the left lower extremity. She visited the ED on 10/18/2021 after a mechanical fall. She developed a hematoma that subsequently opened. She was hospitalized for 7 days and discharged on 10/25/2021. She has been on several different antibiotics For the past month. She states that most recently she was on Levaquin and linezolid for the past week. She states she completes her antibiotic course tomorrow. She has been using  Dakin's wet-to-dry dressings to the wound bed. She denies signs of infection. 4/7; patient presents for follow-up. She has been using Dakin's wet-to-dry dressings. She did end up going to the ED on 4/1 because she had excess bleeding with dressing change that she could not stop. She is on Eliquis for A-fib. In the ED they tied off a small artery. She has had no issues since discharge. She denies signs of infection. 4/14; this is a very difficult clinical situation. A patient with underlying chronic venous insufficiency and lymphedema very significant lower extremity edema had a hematoma after a fall on her left upper lateral lower leg. She is on Eliquis for atrial fibrillation apparently with a history of a splenic infarct following with Dr. Jolyn Nap of cardiology. She has exhibited significant bleeding from the wound surface including ao Venous bleeder that required suturing short while ago. She has been using Dakin's wet-to-dry packing and over the surface of the wound. She saw Dr. Caryl Comes yesterday he is reluctant to consider stopping the Eliquis because of the prior history of presumed cardioembolism. Wants to communicate with Dr. Heber [place] when she returns. In a perfect world where she was not on Eliquis she requires a wound VAC with additional compression wraps but I understand the reluctance to do this because of the concerns of bleeding 4/28; the patient's wound actually looks better today using Dakin's wet-to-dry that she is changing twice a day she is wrapping this with Kerlix and Ace wrapping. She tells me she had 2 small bleeding areas which were part of the superficial wound that stopped this week with direct pressure. Other than that no major issues. In follow-up from discussion of last week Dr. 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 The bigger question  from my point of view is the wound VAC issue. As far as she knows and her daughter-in-law verifies that she has not had any bleeding from the deeper parts of the wound although the bleeding has been superficial including the one that sent her to the ER for stitches. She is concerned that a wound VAC would cause further bleeding and I cannot completely allay those concerns. 5/8; patient presents for follow-up. She has no issues or complaints today. She has been using Dakin's wet-to-dry dressings without issues. She denies signs of infection. 5/12; patient presents for follow-up. She continues to use Dakin's wet-to-dry dressings without issues. She denies signs of infection. She reports some issues with bleeding at times but this has improved. She denies signs of infection. 6/1; patient presents for follow-up. She was recently hospitalized for upper left leg thigh cellulitis. She was given IV cefepime and vancomycin and discharged on oral antibiotics. She has been using Dakin's wet-to-dry dressings to the left lower leg wound. She reports improvement in healing. She currently denies systemic signs of infection. 6/7; patient is using Dakin's wet-to-dry twice daily. In general this looks better than when I saw this a month or so ago however still considerable depth to the tunnel in her left leg. She is going for iron infusions ordered by her primary care doctor I believe 6/15; patient presents for follow-up. She has been using Dakin's wet-to-dry dressings. She has noted some scattered small areas that blister up and heal on her left lower leg. She has been using mupirocin ointment on them. Nothing open today. 6/23; patient's been using Dakin's wet-to-dry dressings to the tunneled wound and Hydrofera Blue to the opening. She has no issues or complaints today. 6/29; patient presents for follow-up. She has been using Dakin's wet-to-dry dressings to the tunneled wound  and Hydrofera Blue to the opening. She states that [facility] is sticking to the wound bed. She denies signs of infection. 7/7; patient presents for follow-up. She has been using Dakin's wet-to-dry dressings to the tunneled wound and PolyMem silver to the opening without issues. 7/13; patient presents for follow-up. She has been using PolyMem silver to the opening and the rope to the tunnel. She has no issues or complaints today. She denies signs of infection. 7/20; patient presents for follow-up. We have been using PolyMem silver to the wound bed. She has no issues or complaints today. She is receiving her custom compression garments tomorrow. 7/27; patient presents for follow-up. She has been using PolyMem silver to the wound bed. She is having her custom compression garments adjusted as these are not staying on very easily. 8/30; patient presents for follow-up. We have been using PolyMem silver to the wound bed. She is still waiting on her custom compression garments to be ordered. She denies signs of infection. 8/11; patient presents for follow-up. The wound VAC has been started and patient has been using this for the past week. Patient has home health who changes the wound VAC. She developed irritation to the periwound. DuoDERM was not being used to the periwound despite being ordered. 8/15; patient presents for follow-up. She restarted the wound VAC and has had improvement to the periwound with the use of DuoDERM. 8/24; patient presents for follow-up. She has been using the wound VAC. She has developed a wound just superior to the original wound. Likely as a result from the wound VAC. She denies signs of infection. 8/29; patient presents for follow-up. We have been using collagen to the wound  bed. She has held off on the wound VAC for the past week. Electronic Signature(s) Signed: 04/19/2022 1:21:05 PM By: Kalman Shan DO Entered By: Kalman Shan on 04/19/2022  13:10:38 -------------------------------------------------------------------------------- Physical Exam Details Patient Name: Date of Service: [name] 04/19/2022 12:45 PM Medical Record Number: [id number] Patient Account Number: [id number] Date of Birth/Sex: Treating RN: 10/15/1950 (71 y.o. [name] Primary Care Provider: Deland Pretty Other Clinician: Referring Provider: Treating Provider/Extender: Judie Grieve in Treatment: 21 Constitutional respirations regular, non-labored and within target range for patient.. Cardiovascular 2+ dorsalis pedis/posterior tibialis pulses. Psychiatric pleasant and cooperative. Notes Left lower extremity: Open wound with granulation tissue at the opening that has depth of about 3 cm. Previous superior small wound has epithelialized. No surrounding signs of infection. Electronic Signature(s) Signed: 04/19/2022 1:21:05 PM By: Kalman Shan DO Entered By: Kalman Shan on 04/19/2022 13:14:33 -------------------------------------------------------------------------------- Physician Orders Details Patient Name: Date of Service: [name]. 04/19/2022 12:45 PM Medical Record Number: [id number] Patient Account Number: [id number] Date of Birth/Sex: Treating RN: 07-04-1951 (71 y.o. [name] Primary Care Provider: Deland Pretty Other Clinician: Referring Provider: Treating Provider/Extender: Judie Grieve in Treatment: 21 Verbal / Phone Orders: No Diagnosis Coding ICD-10 Coding Code Description [phone] Non-pressure chronic ulcer of other part of left lower leg with fat layer exposed T79.8XXA Other early complications of trauma, initial encounter I48.0 Paroxysmal atrial fibrillation Z79.01 Long term (current) use of anticoagulants Follow-up Appointments ppointment in 1 week. - Dr. Heber [place] and [place], Room 8 04/28/2022 130pm Thursday Return A ppointment in 2  weeks. - Dr. Heber [place] and Tammi Klippel, Room8 Return A Other: - Continue to wear compression stockings daily. Medical Modalities wound vac. Call the number on the wound vac for pick up. Bathing/ Shower/ Hygiene May shower with protection but do not get wound dressing(s) wet. Negative Presssure Wound Therapy Discontinue wound vac - patient to call the number on the machine for pick up. Edema Control - Lymphedema / SCD / Other Elevate legs to the level of the heart or above for 30 minutes daily and/or when sitting, a frequency of: - 3-4 times a day throughout the day. Avoid standing for long periods of time. Moisturize legs daily. - both legs every night before bed. Home Health New wound care orders this week; continue Home Health for wound care. May utilize formulary equivalent dressing for wound treatment orders unless otherwise specified. - Iodoform packing daily to left leg. Tubigrip size E for compression. Other Home Health Orders/Instructions: - [facility] Wound Treatment Wound #1 - Lower Leg Wound Laterality: Left, Lateral Cleanser: Wound Cleanser (Home Health) Every Other Day/30 Days Discharge Instructions: Cleanse the wound with wound cleanser prior to applying a clean dressing using gauze sponges, not tissue or cotton balls. Peri-Wound Care: Skin Prep [facility]) Every Other Day/30 Days Discharge Instructions: Use skin prep as directed Prim Dressing: Iodoform packing strip 1/4 (in) Every Other Day/30 Days ary Discharge Instructions: Lightly pack as instructed Secondary Dressing: Woven Gauze Sponge, Non-Sterile 4x4 in [facility]) Every Other Day/30 Days Discharge Instructions: Apply over primary dressing as directed. Secondary Dressing: Zetuvit Plus Silicone Border Dressing 5x5 (in/in) (Home Health) Every Other Day/30 Days Discharge Instructions: Apply silicone border over primary dressing as directed. Compression Wrap: Tubigrip Size E Every Other Day/30 Days Discharge  Instructions: apply to left leg. May remove daily in the evening. If rolls down remove. Electronic Signature(s) Signed: 04/19/2022 1:21:05 PM By: Kalman Shan DO Signed: 04/19/2022 1:21:05  PM By: Geralyn Corwin DO Entered By: Geralyn Corwin on 04/19/2022 13:14:42 -------------------------------------------------------------------------------- Problem List Details Patient Name: Date of Service: [name] 04/19/2022 12:45 PM Medical Record Number: [id number] Patient Account Number: [id number] Date of Birth/Sex: Treating RN: Oct 11, 1950 (71 y.o. [name] Primary Care Provider: [name] Other Clinician: Referring Provider: Treating Provider/Extender: Annamary Rummage in Treatment: 21 Active Problems ICD-10 Encounter Code Description Active Date MDM Diagnosis [phone] Non-pressure chronic ulcer of other part of left lower leg with fat layer exposed3/31/2023 No Yes T79.8XXA Other early complications of trauma, initial encounter 11/19/2021 No Yes I48.0 Paroxysmal atrial fibrillation 11/19/2021 No Yes Z79.01 Long term (current) use of anticoagulants 11/19/2021 No Yes Inactive Problems Resolved Problems Electronic Signature(s) Signed: 04/19/2022 1:21:05 PM By: Geralyn Corwin DO Entered By: Geralyn Corwin on 04/19/2022 13:09:58 -------------------------------------------------------------------------------- Progress Note Details Patient Name: Date of Service: [name]. 04/19/2022 12:45 PM Medical Record Number: [id number] Patient Account Number: [id number] Date of Birth/Sex: Treating RN: 07-20-1951 (71 y.o. [name] Primary Care Provider: [name] Other Clinician: Referring Provider: Treating Provider/Extender: Annamary Rummage in Treatment: 21 Subjective Chief Complaint Information obtained from Patient 11/19/2021; Left lower extremity wound status post fall History of Present Illness  (HPI) Admission 11/19/2021 Ms. Rahaf Carbonell is a 71 year old female with a past medical history of paroxysmal A-fib on Eliquis, hypothyroidism, major depressive disorder, venous insufficiency and chronic diastolic heart failure that presents to the clinic for a 1 month history of nonhealing wound to the left lower extremity. She visited the ED on 10/18/2021 after a mechanical fall. She developed a hematoma that subsequently opened. She was hospitalized for 7 days and discharged on 10/25/2021. She has been on several different antibiotics For the past month. She states that most recently she was on Levaquin and linezolid for the past week. She states she completes her antibiotic course tomorrow. She has been using Dakin's wet-to-dry dressings to the wound bed. She denies signs of infection. 4/7; patient presents for follow-up. She has been using Dakin's wet-to-dry dressings. She did end up going to the ED on 4/1 because she had excess bleeding with dressing change that she could not stop. She is on Eliquis for A-fib. In the ED they tied off a small artery. She has had no issues since discharge. She denies signs of infection. 4/14; this is a very difficult clinical situation. A patient with underlying chronic venous insufficiency and lymphedema very significant lower extremity edema had a hematoma after a fall on her left upper lateral lower leg. She is on Eliquis for atrial fibrillation apparently with a history of a splenic infarct following with Dr. Berton Mount of cardiology. She has exhibited significant bleeding from the wound surface including ao Venous bleeder that required suturing short while ago. She has been using Dakin's wet-to-dry packing and over the surface of the wound. She saw Dr. Graciela Husbands yesterday he is reluctant to consider stopping the Eliquis because of the prior history of presumed cardioembolism. Wants to communicate with Dr. Mikey Bussing when she returns. In a perfect world where she  was not on Eliquis she requires a wound VAC with additional compression wraps but I understand the reluctance to do this because of the concerns of bleeding 4/28; the patient's wound actually looks better today using Dakin's wet-to-dry that she is changing twice a day she is wrapping this with Kerlix and Ace wrapping. She tells me she had 2 small bleeding areas which were part of  the superficial wound that stopped this week with direct pressure. Other than that no major issues. In follow-up from discussion of last week Dr. 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 The bigger question from my point of view is the wound VAC issue. As far as she knows and her daughter-in-law verifies that she has not had any bleeding from the deeper parts of the wound although the bleeding has been superficial including the one that sent her to the ER for stitches. She is concerned that a wound VAC would cause further bleeding and I cannot completely allay those concerns. 5/8; patient presents for follow-up. She has no issues or complaints today. She has been using Dakin's wet-to-dry dressings without issues. She denies signs of infection. 5/12; patient presents for follow-up. She continues to use Dakin's wet-to-dry dressings without issues. She denies signs of infection. She reports some issues with bleeding at times but this has improved. She denies signs of infection. 6/1; patient presents for follow-up. She was recently hospitalized for upper left leg thigh cellulitis. She was given IV cefepime and vancomycin and discharged on oral antibiotics. She has been using Dakin's wet-to-dry dressings to the left lower leg wound. She reports improvement in healing. She currently denies systemic signs of infection. 6/7; patient is using Dakin's wet-to-dry twice  daily. In general this looks better than when I saw this a month or so ago however still considerable depth to the tunnel in her left leg. She is going for iron infusions ordered by her primary care doctor I believe 6/15; patient presents for follow-up. She has been using Dakin's wet-to-dry dressings. She has noted some scattered small areas that blister up and heal on her left lower leg. She has been using mupirocin ointment on them. Nothing open today. 6/23; patient's been using Dakin's wet-to-dry dressings to the tunneled wound and Hydrofera Blue to the opening. She has no issues or complaints today. 6/29; patient presents for follow-up. She has been using Dakin's wet-to-dry dressings to the tunneled wound and Hydrofera Blue to the opening. She states that [facility] is sticking to the wound bed. She denies signs of infection. 7/7; patient presents for follow-up. She has been using Dakin's wet-to-dry dressings to the tunneled wound and PolyMem silver to the opening without issues. 7/13; patient presents for follow-up. She has been using PolyMem silver to the opening and the rope to the tunnel. She has no issues or complaints today. She denies signs of infection. 7/20; patient presents for follow-up. We have been using PolyMem silver to the wound bed. She has no issues or complaints today. She is receiving her custom compression garments tomorrow. 7/27; patient presents for follow-up. She has been using PolyMem silver to the wound bed. She is having her custom compression garments adjusted as these are not staying on very easily. 8/30; patient presents for follow-up. We have been using PolyMem silver to the wound bed. She is still waiting on her custom compression garments to be ordered. She denies signs of infection. 8/11; patient presents for follow-up. The wound VAC has been started and patient has been using this for the past week. Patient has home health who changes the wound VAC. She  developed irritation to the periwound. DuoDERM was not being used to the periwound despite being ordered. 8/15; patient presents for follow-up. She  restarted the wound VAC and has had improvement to the periwound with the use of DuoDERM. 8/24; patient presents for follow-up. She has been using the wound VAC. She has developed a wound just superior to the original wound. Likely as a result from the wound VAC. She denies signs of infection. 8/29; patient presents for follow-up. We have been using collagen to the wound bed. She has held off on the wound VAC for the past week. Patient History Medical History Cardiovascular Patient has history of Congestive Heart Failure, Hypertension Hospitalization/Surgery History - Cellulitis left leg- 01/03/2022-01/07/2022. Medical A Surgical History Notes nd Constitutional Symptoms (General Health) Infarction of spleen Hematologic/Lymphatic Hypothyroidism Cardiovascular A-Fib Gastrointestinal Gastroesophageal reflux Genitourinary Chronic kidney disease Musculoskeletal Osteoarthritis of knee Psychiatric Anxiety Objective Constitutional respirations regular, non-labored and within target range for patient.. Vitals Time Taken: 12:51 PM, Height: 67 in, Weight: 340 lbs, BMI: 53.2, Temperature: 98.1 F, Pulse: 51 bpm, Respiratory Rate: 20 breaths/min, Blood Pressure: 79/83 mmHg. Cardiovascular 2+ dorsalis pedis/posterior tibialis pulses. Psychiatric pleasant and cooperative. General Notes: Left lower extremity: Open wound with granulation tissue at the opening that has depth of about 3 cm. Previous superior small wound has epithelialized. No surrounding signs of infection. Integumentary (Hair, Skin) Wound #1 status is Open. Original cause of wound was Trauma. The date acquired was: 10/18/2021. The wound has been in treatment 21 weeks. The wound is located on the Left,Lateral Lower Leg. The wound measures 0.4cm length x 0.4cm width x 0.5cm depth;  0.126cm^2 area and 0.063cm^3 volume. There is Fat Layer (Subcutaneous Tissue) exposed. There is no undermining noted, however, there is tunneling at 11:00 with a maximum distance of 3.2cm. There is a medium amount of serosanguineous drainage noted. The wound margin is distinct with the outline attached to the wound base. There is large (67-100%) red granulation within the wound bed. There is no necrotic tissue within the wound bed. Assessment Active Problems ICD-10 Non-pressure chronic ulcer of other part of left lower leg with fat layer exposed Other early complications of trauma, initial encounter Paroxysmal atrial fibrillation Long term (current) use of anticoagulants Patient's wound has shown improvement in size and appearance since last clinic visit. There is a small opening present with increased depth. At this time I think she would benefit from iodoform packing as this is a very small opening. She can stop collagen. Surrounding periwound skin is intact. I think at this time we can discontinue the wound VAC. Unfortunately her custom compression garments do not stay up well. We will give her Tubigrip in office. Plan Follow-up Appointments: Return Appointment in 1 week. - Dr. Heber [place] and [place], Room 8 04/28/2022 130pm Thursday Return Appointment in 2 weeks. - Dr. Heber [place] and Tammi Klippel, Room8 Other: - Continue to wear compression stockings daily. Medical Modalities wound vac. Call the number on the wound vac for pick up. Bathing/ Shower/ Hygiene: May shower with protection but do not get wound dressing(s) wet. Negative Presssure Wound Therapy: Discontinue wound vac - patient to call the number on the machine for pick up. Edema Control - Lymphedema / SCD / Other: Elevate legs to the level of the heart or above for 30 minutes daily and/or when sitting, a frequency of: - 3-4 times a day throughout the day. Avoid standing for long periods of time. Moisturize legs daily. - both legs every night  before bed. Home Health: New wound care orders this week; continue Home Health for wound care. May utilize formulary equivalent dressing for wound treatment orders unless otherwise specified. -  Iodoform packing daily to left leg. Tubigrip size E for compression. Other Home Health Orders/Instructions: - [facility] WOUND #1: - Lower Leg Wound Laterality: Left, Lateral Cleanser: Wound Cleanser (Home Health) Every Other Day/30 Days Discharge Instructions: Cleanse the wound with wound cleanser prior to applying a clean dressing using gauze sponges, not tissue or cotton balls. Peri-Wound Care: Skin Prep [facility]) Every Other Day/30 Days Discharge Instructions: Use skin prep as directed Prim Dressing: Iodoform packing strip 1/4 (in) Every Other Day/30 Days ary Discharge Instructions: Lightly pack as instructed Secondary Dressing: Woven Gauze Sponge, Non-Sterile 4x4 in [facility]) Every Other Day/30 Days Discharge Instructions: Apply over primary dressing as directed. Secondary Dressing: Zetuvit Plus Silicone Border Dressing 5x5 (in/in) (Home Health) Every Other Day/30 Days Discharge Instructions: Apply silicone border over primary dressing as directed. Com pression Wrap: Tubigrip Size E Every Other Day/30 Days Discharge Instructions: apply to left leg. May remove daily in the evening. If rolls down remove. 1. Iodoform packing 2. Follow-up in 1 week 3. Tubigrip Electronic Signature(s) Signed: 04/19/2022 1:21:05 PM By: Kalman Shan DO Entered By: Kalman Shan on 04/19/2022 13:19:28 -------------------------------------------------------------------------------- HxROS Details Patient Name: Date of Service: [name] 04/19/2022 12:45 PM Medical Record Number: [id number] Patient Account Number: [id number] Date of Birth/Sex: Treating RN: [birth year] (71 y.o. [name] Primary Care Provider: Deland Pretty Other Clinician: Referring Provider: Treating  Provider/Extender: Judie Grieve in Treatment: 21 Constitutional Symptoms (General Health) Medical History: Past Medical History Notes: Infarction of spleen Hematologic/Lymphatic Medical History: Past Medical History Notes: Hypothyroidism Cardiovascular Medical History: Positive for: Congestive Heart Failure; Hypertension Past Medical History Notes: A-Fib Gastrointestinal Medical History: Past Medical History Notes: Gastroesophageal reflux Genitourinary Medical History: Past Medical History Notes: Chronic kidney disease Musculoskeletal Medical History: Past Medical History Notes: Osteoarthritis of knee Psychiatric Medical History: Past Medical History Notes: Anxiety Immunizations Pneumococcal Vaccine: Received Pneumococcal Vaccination: No Implantable Devices No devices added Hospitalization / Surgery History Type of Hospitalization/Surgery Cellulitis left leg- 01/03/2022-01/07/2022 Electronic Signature(s) Signed: 04/19/2022 1:21:05 PM By: Kalman Shan DO Signed: 04/19/2022 5:09:16 PM By: Deon Pilling RN, BSN Entered By: Kalman Shan on 04/19/2022 13:10:47 -------------------------------------------------------------------------------- SuperBill Details Patient Name: Date of Service: [name] 04/19/2022 Medical Record Number: [id number] Patient Account Number: [id number] Date of Birth/Sex: Treating RN: 08-30-1950 (71 y.o. [name] Primary Care Provider: Deland Pretty Other Clinician: Referring Provider: Treating Provider/Extender: Judie Grieve in Treatment: 21 Diagnosis Coding ICD-10 Codes Code Description [phone] Non-pressure chronic ulcer of other part of left lower leg with fat layer exposed T79.8XXA Other early complications of trauma, initial encounter I48.0 Paroxysmal atrial fibrillation Z79.01 Long term (current) use of anticoagulants Facility Procedures CPT4 Code:  [id number] Description: 99213 - WOUND CARE VISIT-LEV 3 EST PT Modifier: Quantity: 1 Physician Procedures : CPT4 Code Description Modifier [id number] - WC PHYS LEVEL 3 - EST PT ICD-10 Diagnosis Description L97.822 Non-pressure chronic ulcer of other part of left lower leg with fat layer exposed T79.8XXA Other early complications of trauma, initial  encounter I48.0 Paroxysmal atrial fibrillation Z79.01 Long term (current) use of anticoagulants Quantity: 1 Electronic Signature(s) Signed: 04/19/2022 1:21:05 PM By: Kalman Shan DO Entered By: Kalman Shan on 04/19/2022 13:20:08

## 2022-04-19 NOTE — Progress Notes (Signed)
MARC, SIVERTSEN ([id number]) Visit Report for 04/19/2022 Arrival Information Details Patient Name: Date of Service: [name] 04/19/2022 12:45 PM Medical Record Number: [id number] Patient Account Number: [id number] Date of Birth/Sex: Treating RN: Sep 23, 1950 (71 y.o. [name], Meta.Reding Primary Care Lacreasha Hinds: Deland Pretty Other Clinician: Referring Khang Hannum: Treating Inez Rosato/Extender: Judie Grieve in Treatment: 21 Visit Information History Since Last Visit Added or deleted any medications: No Patient Arrived: [name] Any new allergies or adverse reactions: No Arrival Time: 12:46 Had a fall or experienced change in No Accompanied By: self activities of daily living that may affect Transfer Assistance: None risk of falls: Patient Identification Verified: Yes Signs or symptoms of abuse/neglect since last visito No Secondary Verification Process Completed: Yes Hospitalized since last visit: No Patient Requires Transmission-Based Precautions: No Implantable device outside of the clinic excluding No Patient Has Alerts: Yes cellular tissue based products placed in the center Patient Alerts: Patient on Blood Thinner since last visit: Has Dressing in Place as Prescribed: Yes Has Compression in Place as Prescribed: No Pain Present Now: No Notes per patient purchase compression stockings they continue to roll down. Electronic Signature(s) Signed: 04/19/2022 5:09:16 PM By: Deon Pilling RN, BSN Entered By: Deon Pilling on 04/19/2022 12:50:09 -------------------------------------------------------------------------------- Clinic Level of Care Assessment Details Patient Name: Date of Service: [name] 04/19/2022 12:45 PM Medical Record Number: [id number] Patient Account Number: [id number] Date of Birth/Sex: Treating RN: 05-13-1951 (71 y.o. [name], Meta.Reding Primary Care Yarelly Kuba: Deland Pretty Other Clinician: Referring Kyrra Prada: Treating  Oralee Rapaport/Extender: Judie Grieve in Treatment: 21 Clinic Level of Care Assessment Items TOOL 4 Quantity Score X- 1 0 Use when only an EandM is performed on FOLLOW-UP visit ASSESSMENTS - Nursing Assessment / Reassessment X- 1 10 Reassessment of Co-morbidities (includes updates in patient status) X- 1 5 Reassessment of Adherence to Treatment Plan ASSESSMENTS - Wound and Skin A ssessment / Reassessment X - Simple Wound Assessment / Reassessment - one wound 1 5 []  - 0 Complex Wound Assessment / Reassessment - multiple wounds X- 1 10 Dermatologic / Skin Assessment (not related to wound area) ASSESSMENTS - Focused Assessment X- 1 5 Circumferential Edema Measurements - multi extremities []  - 0 Nutritional Assessment / Counseling / Intervention []  - 0 Lower Extremity Assessment (monofilament, tuning fork, pulses) []  - 0 Peripheral Arterial Disease Assessment (using hand held doppler) ASSESSMENTS - Ostomy and/or Continence Assessment and Care []  - 0 Incontinence Assessment and Management []  - 0 Ostomy Care Assessment and Management (repouching, etc.) PROCESS - Coordination of Care X - Simple Patient / Family Education for ongoing care 1 15 []  - 0 Complex (extensive) Patient / Family Education for ongoing care X- 1 10 Staff obtains Programmer, systems, Records, T Results / Process Orders est X- 1 10 Staff telephones HHA, Nursing Homes / Clarify orders / etc []  - 0 Routine Transfer to another Facility (non-emergent condition) []  - 0 Routine Hospital Admission (non-emergent condition) []  - 0 New Admissions / Biomedical engineer / Ordering NPWT Apligraf, etc. , []  - 0 Emergency Hospital Admission (emergent condition) X- 1 10 Simple Discharge Coordination []  - 0 Complex (extensive) Discharge Coordination PROCESS - Special Needs []  - 0 Pediatric / Minor Patient Management []  - 0 Isolation Patient Management []  - 0 Hearing / Language / Visual special  needs []  - 0 Assessment of Community assistance (transportation, D/C planning, etc.) []  - 0 Additional assistance / Altered mentation []  - 0 Support Surface(s) Assessment (bed, cushion, seat,  etc.) INTERVENTIONS - Wound Cleansing / Measurement X - Simple Wound Cleansing - one wound 1 5 []  - 0 Complex Wound Cleansing - multiple wounds X- 1 5 Wound Imaging (photographs - any number of wounds) []  - 0 Wound Tracing (instead of photographs) X- 1 5 Simple Wound Measurement - one wound []  - 0 Complex Wound Measurement - multiple wounds INTERVENTIONS - Wound Dressings X - Small Wound Dressing one or multiple wounds 1 10 []  - 0 Medium Wound Dressing one or multiple wounds []  - 0 Large Wound Dressing one or multiple wounds []  - 0 Application of Medications - topical []  - 0 Application of Medications - injection INTERVENTIONS - Miscellaneous []  - 0 External ear exam []  - 0 Specimen Collection (cultures, biopsies, blood, body fluids, etc.) []  - 0 Specimen(s) / Culture(s) sent or taken to Lab for analysis []  - 0 Patient Transfer (multiple staff / Civil Service fast streamer / Similar devices) []  - 0 Simple Staple / Suture removal (25 or less) []  - 0 Complex Staple / Suture removal (26 or more) []  - 0 Hypo / Hyperglycemic Management (close monitor of Blood Glucose) []  - 0 Ankle / Brachial Index (ABI) - do not check if billed separately X- 1 5 Vital Signs Has the patient been seen at the hospital within the last three years: Yes Total Score: 110 Level Of Care: New/Established - Level 3 Electronic Signature(s) Signed: 04/19/2022 5:09:16 PM By: Deon Pilling RN, BSN Entered By: Deon Pilling on 04/19/2022 13:09:36 -------------------------------------------------------------------------------- Encounter Discharge Information Details Patient Name: Date of Service: [name]. 04/19/2022 12:45 PM Medical Record Number: [id number] Patient Account Number: [id number] Date of Birth/Sex:  Treating RN: May 30, 1951 (71 y.o. [name] Primary Care Chantele Corado: Deland Pretty Other Clinician: Referring Amous Crewe: Treating Eulene Pekar/Extender: Judie Grieve in Treatment: 21 Encounter Discharge Information Items Discharge Condition: Stable Ambulatory Status: Walker Discharge Destination: Home Transportation: Private Auto Accompanied By: self Schedule Follow-up Appointment: Yes Clinical Summary of Care: Electronic Signature(s) Signed: 04/19/2022 5:09:16 PM By: Deon Pilling RN, BSN Entered By: Deon Pilling on 04/19/2022 13:10:03 -------------------------------------------------------------------------------- Lower Extremity Assessment Details Patient Name: Date of Service: [name] 04/19/2022 12:45 PM Medical Record Number: [id number] Patient Account Number: [id number] Date of Birth/Sex: Treating RN: Jul 12, 1951 (71 y.o. [name] Primary Care Lovina Zuver: Deland Pretty Other Clinician: Referring Mali Eppard: Treating Melody Savidge/Extender: Judie Grieve in Treatment: 21 Edema Assessment Assessed: Shirlyn Goltz: Yes] Patrice Paradise: No] Edema: [Left: Ye] [Right: s] Calf Left: Right: Point of Measurement: 31 cm From Medial Instep 45 cm Ankle Left: Right: Point of Measurement: 9 cm From Medial Instep 26 cm Vascular Assessment Pulses: Dorsalis Pedis Palpable: [Left:Yes] Electronic Signature(s) Signed: 04/19/2022 5:09:16 PM By: Deon Pilling RN, BSN Entered By: Deon Pilling on 04/19/2022 12:54:47 -------------------------------------------------------------------------------- Multi Wound Chart Details Patient Name: Date of Service: [name]. 04/19/2022 12:45 PM Medical Record Number: [id number] Patient Account Number: [id number] Date of Birth/Sex: Treating RN: 1951-02-09 (71 y.o. [name] Primary Care Jmichael Gille: Deland Pretty Other Clinician: Referring Lynesha Bango: Treating Kamryn Gauthier/Extender: Judie Grieve in Treatment: 21 Vital Signs Height(in): 67 Pulse(bpm): 51 Weight(lbs): 340 Blood Pressure(mmHg): 79/83 Body Mass Index(BMI): 53.2 Temperature(F): 98.1 Respiratory Rate(breaths/min): 20 Photos: [N/A:N/A] Left, Lateral Lower Leg N/A N/A Wound Location: Trauma N/A N/A Wounding Event: Trauma, Other N/A N/A Primary Etiology: Congestive Heart Failure, N/A N/A Comorbid History: Hypertension 10/18/2021 N/A N/A Date Acquired: 21 N/A N/A Weeks of Treatment: Open N/A N/A Wound Status: No  N/A N/A Wound Recurrence: Yes N/A N/A Clustered Wound: 1 N/A N/A Clustered Quantity: 0.4x0.4x0.5 N/A N/A Measurements L x W x D (cm) 0.126 N/A N/A A (cm) : rea 0.063 N/A N/A Volume (cm) : 99.90% N/A N/A % Reduction in A rea: 100.00% N/A N/A % Reduction in Volume: 11 Position 1 (o'clock): 3.2 Maximum Distance 1 (cm): Yes N/A N/A Tunneling: Full Thickness Without Exposed N/A N/A Classification: Support Structures Medium N/A N/A Exudate Amount: Serosanguineous N/A N/A Exudate Type: red, brown N/A N/A Exudate Color: Distinct, outline attached N/A N/A Wound Margin: Large (67-100%) N/A N/A Granulation Amount: Red N/A N/A Granulation Quality: None Present (0%) N/A N/A Necrotic Amount: Fat Layer (Subcutaneous Tissue): Yes N/A N/A Exposed Structures: Fascia: No Tendon: No Muscle: No Joint: No Bone: No Large (67-100%) N/A N/A Epithelialization: Treatment Notes Wound #1 (Lower Leg) Wound Laterality: Left, Lateral Cleanser Wound Cleanser Discharge Instruction: Cleanse the wound with wound cleanser prior to applying a clean dressing using gauze sponges, not tissue or cotton balls. Peri-Wound Care Skin Prep Discharge Instruction: Use skin prep as directed Topical Primary Dressing Iodoform packing strip 1/4 (in) Discharge Instruction: Lightly pack as instructed Secondary Dressing Woven Gauze Sponge, Non-Sterile 4x4 in Discharge  Instruction: Apply over primary dressing as directed. Zetuvit Plus Silicone Border Dressing 5x5 (in/in) Discharge Instruction: Apply silicone border over primary dressing as directed. Secured With Compression Wrap Tubigrip Size E Discharge Instruction: apply to left leg. May remove daily in the evening. If rolls down remove. Compression Stockings Add-Ons Electronic Signature(s) Signed: 04/19/2022 1:21:05 PM By: Kalman Shan DO Signed: 04/19/2022 5:09:16 PM By: Deon Pilling RN, BSN Entered By: Kalman Shan on 04/19/2022 13:10:03 -------------------------------------------------------------------------------- Firth Details Patient Name: Date of Service: [name] 04/19/2022 12:45 PM Medical Record Number: [id number] Patient Account Number: [id number] Date of Birth/Sex: Treating RN: 08-25-1950 (71 y.o. [name], Meta.Reding Primary Care Aeron Donaghey: Deland Pretty Other Clinician: Referring Guy Toney: Treating Aleiya Rye/Extender: Judie Grieve in Treatment: 21 Active Inactive Abuse / Safety / Falls / Self Care Management Nursing Diagnoses: History of Falls Potential for falls Goals: Patient/caregiver will verbalize/demonstrate measure taken to improve self care Date Initiated: 11/19/2021 Date Inactivated: 04/05/2022 Target Resolution Date: 04/21/2022 Goal Status: Met Patient/caregiver will verbalize/demonstrate measures taken to prevent injury and/or falls Date Initiated: 11/19/2021 Target Resolution Date: 05/06/2022 Goal Status: Active Interventions: Provide education on basic hygiene Provide education on fall prevention Provide education on HBO safety Provide education on vaccinations Notes: Pain, Acute or Chronic Nursing Diagnoses: Pain, acute or chronic: actual or potential Potential alteration in comfort, pain Goals: Patient will verbalize adequate pain control and receive pain control interventions during  procedures as needed Date Initiated: 11/19/2021 Date Inactivated: 04/05/2022 Target Resolution Date: 04/22/2022 Goal Status: Met Patient/caregiver will verbalize comfort level met Date Initiated: 11/19/2021 Target Resolution Date: 05/20/2022 Goal Status: Active Interventions: Encourage patient to take pain medications as prescribed Provide education on pain management Reposition patient for comfort Treatment Activities: Administer pain control measures as ordered : 11/19/2021 Notes: Electronic Signature(s) Signed: 04/19/2022 5:09:16 PM By: Deon Pilling RN, BSN Entered By: Deon Pilling on 04/19/2022 12:58:42 -------------------------------------------------------------------------------- Pain Assessment Details Patient Name: Date of Service: [name]. 04/19/2022 12:45 PM Medical Record Number: [id number] Patient Account Number: [id number] Date of Birth/Sex: Treating RN: [birth year] (71 y.o. [name] Primary Care Zoe Creasman: Deland Pretty Other Clinician: Referring Shaquill Iseman: Treating Nekhi Liwanag/Extender: Judie Grieve in Treatment: 21 Active Problems Location of Pain Severity and Description of Pain  Patient Has Paino No Site Locations Rate the pain. Rate the pain. Current Pain Level: 0 Pain Management and Medication Current Pain Management: Medication: No Cold Application: No Rest: No Massage: No Activity: No T.E.N.S.: No Heat Application: No Leg drop or elevation: No Is the Current Pain Management Adequate: Adequate How does your wound impact your activities of daily livingo Sleep: No Bathing: No Appetite: No Relationship With Others: No Bladder Continence: No Emotions: No Bowel Continence: No Work: No Toileting: No Drive: No Dressing: No Hobbies: No Engineer, maintenance) Signed: 04/19/2022 5:09:16 PM By: Deon Pilling RN, BSN Entered By: Deon Pilling on 04/19/2022  12:52:09 -------------------------------------------------------------------------------- Patient/Caregiver Education Details Patient Name: Date of Service: [name] 8/29/2023andnbsp12:45 PM Medical Record Number: [id number] Patient Account Number: [id number] Date of Birth/Gender: Treating RN: 04-20-1951 (71 y.o. [name] Primary Care Physician: Deland Pretty Other Clinician: Referring Physician: Treating Physician/Extender: Judie Grieve in Treatment: 21 Education Assessment Education Provided To: Patient Education Topics Provided Pain: Handouts: A Guide to Pain Control Methods: Explain/Verbal Responses: Reinforcements needed Electronic Signature(s) Signed: 04/19/2022 5:09:16 PM By: Deon Pilling RN, BSN Entered By: Deon Pilling on 04/19/2022 12:58:54 -------------------------------------------------------------------------------- Wound Assessment Details Patient Name: Date of Service: [name]. 04/19/2022 12:45 PM Medical Record Number: [id number] Patient Account Number: [id number] Date of Birth/Sex: Treating RN: Dec 07, 1950 (71 y.o. [name], Meta.Reding Primary Care Yamilex Borgwardt: Deland Pretty Other Clinician: Referring Shalik Sanfilippo: Treating Sophiea Ueda/Extender: Judie Grieve in Treatment: 21 Wound Status Wound Number: 1 Primary Etiology: Trauma, Other Wound Location: Left, Lateral Lower Leg Wound Status: Open Wounding Event: Trauma Comorbid History: Congestive Heart Failure, Hypertension Date Acquired: 10/18/2021 Weeks Of Treatment: 21 Clustered Wound: Yes Photos Wound Measurements Length: (cm) 0.4 Width: (cm) 0.4 Depth: (cm) 0.5 Clustered Quantity: 1 Area: (cm) 0.126 Volume: (cm) 0.063 % Reduction in Area: 99.9% % Reduction in Volume: 100% Epithelialization: Large (67-100%) Tunneling: Yes Position (o'clock): 11 Maximum Distance: (cm) 3.2 Undermining: No Wound  Description Classification: Full Thickness Without Exposed Support Structures Wound Margin: Distinct, outline attached Exudate Amount: Medium Exudate Type: Serosanguineous Exudate Color: red, brown Foul Odor After Cleansing: No Slough/Fibrino No Wound Bed Granulation Amount: Large (67-100%) Exposed Structure Granulation Quality: Red Fascia Exposed: No Necrotic Amount: None Present (0%) Fat Layer (Subcutaneous Tissue) Exposed: Yes Tendon Exposed: No Muscle Exposed: No Joint Exposed: No Bone Exposed: No Treatment Notes Wound #1 (Lower Leg) Wound Laterality: Left, Lateral Cleanser Wound Cleanser Discharge Instruction: Cleanse the wound with wound cleanser prior to applying a clean dressing using gauze sponges, not tissue or cotton balls. Peri-Wound Care Skin Prep Discharge Instruction: Use skin prep as directed Topical Primary Dressing Iodoform packing strip 1/4 (in) Discharge Instruction: Lightly pack as instructed Secondary Dressing Woven Gauze Sponge, Non-Sterile 4x4 in Discharge Instruction: Apply over primary dressing as directed. Zetuvit Plus Silicone Border Dressing 5x5 (in/in) Discharge Instruction: Apply silicone border over primary dressing as directed. Secured With Compression Wrap Tubigrip Size E Discharge Instruction: apply to left leg. May remove daily in the evening. If rolls down remove. Compression Stockings Add-Ons Electronic Signature(s) Signed: 04/19/2022 5:09:16 PM By: Deon Pilling RN, BSN Entered By: Deon Pilling on 04/19/2022 12:58:13 -------------------------------------------------------------------------------- Vitals Details Patient Name: Date of Service: Christine Simmonds J. 04/19/2022 12:45 PM Medical Record Number: [id number] Patient Account Number: [id number] Date of Birth/Sex: Treating RN: [birth year] (71 y.o. [name] Primary Care Paizlee Kinder: Deland Pretty Other Clinician: Referring Chondra Boyde: Treating Bentleigh Stankus/Extender:  Judie Grieve in Treatment: 21 Vital  Signs Time Taken: 12:51 Temperature (F): 98.1 Height (in): 67 Pulse (bpm): 51 Weight (lbs): 340 Respiratory Rate (breaths/min): 20 Body Mass Index (BMI): 53.2 Blood Pressure (mmHg): 79/83 Reference Range: 80 - 120 mg / dl Electronic Signature(s) Signed: 04/19/2022 5:09:16 PM By: Deon Pilling RN, BSN Entered By: Deon Pilling on 04/19/2022 12:51:59

## 2022-04-26 ENCOUNTER — Encounter: Payer: Self-pay | Admitting: Internal Medicine

## 2022-04-26 ENCOUNTER — Ambulatory Visit: Payer: PPO | Attending: Internal Medicine | Admitting: Internal Medicine

## 2022-04-26 VITALS — BP 154/78 | HR 49 | Ht 67.0 in | Wt 321.0 lb

## 2022-04-26 DIAGNOSIS — D735 Infarction of spleen: Secondary | ICD-10-CM

## 2022-04-26 DIAGNOSIS — I4819 Other persistent atrial fibrillation: Secondary | ICD-10-CM | POA: Diagnosis not present

## 2022-04-26 DIAGNOSIS — Z959 Presence of cardiac and vascular implant and graft, unspecified: Secondary | ICD-10-CM | POA: Diagnosis not present

## 2022-04-26 MED ORDER — FLECAINIDE ACETATE 100 MG PO TABS
100.0000 mg | ORAL_TABLET | Freq: Two times a day (BID) | ORAL | 11 refills | Status: DC
Start: 2022-04-26 — End: 2023-05-02

## 2022-04-26 NOTE — Patient Instructions (Signed)
Medication Instructions:  - Your physician recommends that you continue on your current medications as directed. Please refer to the Current Medication list given to you today.  *If you need a refill on your cardiac medications before your next appointment, please call your pharmacy*   Lab Work: - none ordered  If you have labs (blood work) drawn today and your tests are completely normal, you will receive your results only by: MyChart Message (if you have MyChart) OR A paper copy in the mail If you have any lab test that is abnormal or we need to change your treatment, we will call you to review the results.   Testing/Procedures: - none ordered   Follow-Up: At [facility], you and your health needs are our priority.  As part of our continuing mission to provide you with exceptional heart care, we have created designated Provider Care Teams.  These Care Teams include your primary Cardiologist (physician) and Advanced Practice Providers (APPs -  Physician Assistants and Nurse Practitioners) who all work together to provide you with the care you need, when you need it.  We recommend signing up for the patient portal called "MyChart".  Sign up information is provided on this After Visit Summary.  MyChart is used to connect with patients for Virtual Visits (Telemedicine).  Patients are able to view lab/test results, encounter notes, upcoming appointments, etc.  Non-urgent messages can be sent to your provider as well.   To learn more about what you can do with MyChart, go to [email or web address].    Your next appointment:   1 year(s)  The format for your next appointment:   In Person  Provider:   Steven Klein, MD    Other Instructions N/a  Important Information About Sugar       

## 2022-04-26 NOTE — Progress Notes (Signed)
Patient ID: [name], female   DOB: 09/23/1950, 71 y.o.   MRN: [id number]       Patient Care Team: [name], MD as PCP - General (Internal Medicine)   HPI  [name] is a 71 y.o. 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  Intercurrently  diagnosed with atrial fibrillation with symptomatic recurrences associated with dyspnea.    On Flecainide.   Anticoagulation (apixaban) and is on [employer] patient support; no  bleeding.    2019 negative sleep study  5/23 hospitalized with a lower leg cellulitis and a chronic ulcer in the setting of her morbid obesity Ongoing wound care with improvement Sleeping in a recliner,  not exercising    The patient denies chest pain, nocturnal dyspnea, orthopnea or peripheral edema.  There have been no palpitations, lightheadedness or syncope.  Complains of chronic SOB and fatigue; fear of falling .   DATE TEST EF   4/19 Echo   65-70 % LA size ULN          Date Cr K Hgb  11/21 0.91 4.0 14.0   12/21 0.84 4.2   4/22   0.83 5.4 13.3  1/23 0.94 4.0 12.8   5/23 0.99 3.7 8.5   DATE PR interval QRSduration Dose  12/21  184 118 0  7/22 188 120 100  1/23 190 128 100  9/23 18-240 124 100     Thromboembolic risk factors ( age -61, HTN-1, TIA/CVA-2, Gender-1) for a CHADSVASc Score of >=5  Past Medical History:  Diagnosis Date   Acute respiratory disease due to COVID-19 virus 09/08/2021   Anxiety disorder 09/08/2021   Arthritis    CHF (congestive heart failure) (HCC)    Chronic diastolic heart failure (HCC) 09/08/2021   Chronic kidney disease due to hypertension 09/08/2021   Class 3 obesity (HCC) 09/08/2021   Essential hypertension 10/15/2015   Gastroesophageal reflux disease 09/08/2021   Heart murmur    Hypertension    Hypothyroidism 10/15/2015   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   OA (osteoarthritis) of knee 04/04/2016   Paroxysmal atrial fibrillation (HCC) 09/08/2021   Tobacco user 09/08/2021    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"   Vitamin D deficiency 09/08/2021    Past Surgical History:  Procedure Laterality Date   CARDIOVERSION N/A 02/16/2021   Procedure: CARDIOVERSION;  Surgeon: Duke Salvia, MD;  Location: [facility];  Service: Cardiovascular;  Laterality: N/A;   CHOLECYSTECTOMY     DILATION AND CURETTAGE OF UTERUS     LOOP RECORDER INSERTION N/A 12/21/2017   Procedure: LOOP RECORDER INSERTION;  Surgeon: Duke Salvia, MD;  Location: [facility];  Service: Cardiovascular;  Laterality: N/A;   OMENTECTOMY N/A 10/12/2015   Procedure: PARTIAL OMENTECTOMY;  Surgeon: Violeta Gelinas, MD;  Location: [facility];  Service: General;  Laterality: N/A;   TOTAL KNEE ARTHROPLASTY Left 04/04/2016   Procedure: LEFT TOTAL KNEE ARTHROPLASTY;  Surgeon: Ollen Gross, MD;  Location: [facility];  Service: Orthopedics;  Laterality: Left;   TOTAL KNEE ARTHROPLASTY Right 08/01/2016   Procedure: TOTAL KNEE ARTHROPLASTY;  Surgeon: Ollen Gross, MD;  Location: [facility];  Service: Orthopedics;  Laterality: Right;   UMBILICAL HERNIA REPAIR N/A 10/12/2015   Procedure: HERNIA REPAIR UMBILICAL ADULT/INCARERATED;  Surgeon: Violeta Gelinas, MD;  Location: [facility];  Service: General;  Laterality: N/A;    Current Meds  Medication Sig   acetaminophen (TYLENOL) 500 MG tablet  Take 1,000 mg by mouth every 6 (six) hours as needed for mild pain or headache.   ELIQUIS 5 MG TABS tablet TAKE 1 TABLET BY MOUTH 2 TIMES DAILY.   escitalopram (LEXAPRO) 5 MG tablet Take 5 mg by mouth daily.   flecainide (TAMBOCOR) 100 MG tablet TAKE 1 TABLET BY MOUTH 2 TIMES A DAY   furosemide (LASIX) 20 MG tablet Take 40 mg by mouth every morning.    levothyroxine (SYNTHROID) 88 MCG tablet Take 88 mcg by mouth every morning.   losartan (COZAAR) 50 MG tablet Take 50 mg by mouth daily.   metoprolol tartrate (LOPRESSOR) 50 MG tablet Take 1 tablet (50 mg total) by mouth 2 (two) times daily.   vitamin C (ASCORBIC  ACID) 500 MG tablet Take 500 mg by mouth daily.   zinc gluconate 50 MG tablet Take 50 mg by mouth daily.    Allergies  Allergen Reactions   Augmentin [Amoxicillin-Pot Clavulanate] Hives and Itching    Has patient had a PCN reaction causing immediate rash, facial/tongue/throat swelling, SOB or lightheadedness with hypotension:No Has patient had a PCN reaction causing severe rash involving mucus membranes or skin necrosis:No Has patient had a PCN reaction that required hospitalization:No Has patient had a PCN reaction occurring within the last 10 years:No If all of the above answers are "NO", then may proceed with Cephalosporin use.    Pneumococcal Vaccines Other (See Comments)    Caused fever, and swelling at injection site   Review of Systems negative except from HPI and PMH  Physical Exam    BP (!) 154/78 (BP Location: Left Arm, Patient Position: Sitting, Cuff Size: Large)   Pulse (!) 49   Ht 5\' 7"  (1.702 m)   Wt (!) 321 lb (145.6 kg)   SpO2 97%   BMI 50.28 kg/m  Well developed and Morbidly obese in no acute distress sitting in a wheel chair HENT normal Neck supple  Clear Device pocket well healed; without hematoma or erythema.  There is no tethering  Regular rate and rhythm, no  gallop murmur Abd-soft with active BS No Clubbing cyanosis tr edema Skin-warm and dry A & Oriented  Grossly normal sensory and motor function  ECG sinus@ 55 15-22/12/48     Assessment and  Plan  Splenic infarct  Atrial fibrillation-persistent   Sinus bradycardia less than 5% of her beats greater than 70  Sleep disordered breathing with a negative sleep study  Morbid obesity   Hypertension    Depression  COVID interval 1/23  Atrial fibrillation is quiescent.  We will continue the flecainide at 100 mg twice daily.  No worsening of conduction delay, there are some short beats with a short PR interval but a stable P wave vector which may be low atrial as opposed to  junctional.  Blood pressures are elevated here.  She says at home they are in the 120-30 range.  For now we will continue her on her losartan 50 and metoprolol 50 twice daily.  No bleeding.  Continue her on the Eliquis 5 twice daily.  Long discussion regarding the importance of exercise and balance.  Encouraged her to follow through with physical therapy and to have discussions with home health and her primary care about how it is to make her home safer      *

## 2022-04-28 ENCOUNTER — Encounter (HOSPITAL_BASED_OUTPATIENT_CLINIC_OR_DEPARTMENT_OTHER): Payer: PPO | Attending: Internal Medicine | Admitting: Internal Medicine

## 2022-04-28 DIAGNOSIS — I130 Hypertensive heart and chronic kidney disease with heart failure and stage 1 through stage 4 chronic kidney disease, or unspecified chronic kidney disease: Secondary | ICD-10-CM | POA: Insufficient documentation

## 2022-04-28 DIAGNOSIS — W19XXXA Unspecified fall, initial encounter: Secondary | ICD-10-CM | POA: Diagnosis not present

## 2022-04-28 DIAGNOSIS — N189 Chronic kidney disease, unspecified: Secondary | ICD-10-CM | POA: Diagnosis not present

## 2022-04-28 DIAGNOSIS — I5032 Chronic diastolic (congestive) heart failure: Secondary | ICD-10-CM | POA: Insufficient documentation

## 2022-04-28 DIAGNOSIS — E039 Hypothyroidism, unspecified: Secondary | ICD-10-CM | POA: Insufficient documentation

## 2022-04-28 DIAGNOSIS — T798XXA Other early complications of trauma, initial encounter: Secondary | ICD-10-CM | POA: Diagnosis not present

## 2022-04-28 DIAGNOSIS — I872 Venous insufficiency (chronic) (peripheral): Secondary | ICD-10-CM | POA: Insufficient documentation

## 2022-04-28 DIAGNOSIS — L97822 Non-pressure chronic ulcer of other part of left lower leg with fat layer exposed: Secondary | ICD-10-CM | POA: Insufficient documentation

## 2022-04-28 DIAGNOSIS — I890 Lymphedema, not elsewhere classified: Secondary | ICD-10-CM | POA: Insufficient documentation

## 2022-04-28 DIAGNOSIS — I87312 Chronic venous hypertension (idiopathic) with ulcer of left lower extremity: Secondary | ICD-10-CM | POA: Diagnosis not present

## 2022-04-28 DIAGNOSIS — I480 Paroxysmal atrial fibrillation: Secondary | ICD-10-CM | POA: Diagnosis not present

## 2022-04-28 DIAGNOSIS — Z7901 Long term (current) use of anticoagulants: Secondary | ICD-10-CM | POA: Insufficient documentation

## 2022-04-28 NOTE — Progress Notes (Signed)
[name], [name] ([id number]) Visit Report for 04/28/2022 Chief Complaint Document Details Patient Name: Date of Service: [name] 04/28/2022 1:30 PM Medical Record Number: [id number] Patient Account Number: [id number] Date of Birth/Sex: Treating RN: [name] (71 y.o. [name] Primary Care Provider: [name] Other Clinician: Referring Provider: Treating Provider/Extender: [name] in Treatment: 22 Information Obtained from: Patient Chief Complaint 11/19/2021; Left lower extremity wound status post fall Electronic Signature(s) Signed: 04/28/2022 3:23:06 PM By: Christine Corwin DO Entered By: [name] on 04/28/2022 15:19:31 -------------------------------------------------------------------------------- HPI Details Patient Name: Date of Service: [name], Christine [name] 04/28/2022 1:30 PM Medical Record Number: [id number] Patient Account Number: [id number] Date of Birth/Sex: Treating RN: [birth year] (71 y.o. [name] Primary Care Provider: [name] Other Clinician: Referring Provider: Treating Provider/Extender: [name] in Treatment: 22 History of Present Illness HPI Description: Admission 11/19/2021 [name] is a 71 year old female with a past medical history of paroxysmal A-fib on Eliquis, hypothyroidism, major depressive disorder, venous insufficiency and chronic diastolic heart failure that presents to the clinic for a 1 month history of nonhealing wound to the left lower extremity. She visited the ED on 10/18/2021 after a mechanical fall. She developed a hematoma that subsequently opened. She was hospitalized for 7 days and discharged on 10/25/2021. She has been on several different antibiotics For the past month. She states that most recently she was on Levaquin and linezolid for the past week. She states she completes her antibiotic course tomorrow. She has been using Dakin's  wet-to-dry dressings to the wound bed. She denies signs of infection. 4/7; patient presents for follow-up. She has been using Dakin's wet-to-dry dressings. She did end up going to the ED on 4/1 because she had excess bleeding with dressing change that she could not stop. She is on Eliquis for A-fib. In the ED they tied off a small artery. She has had no issues since discharge. She denies signs of infection. 4/14; this is a very difficult clinical situation. A patient with underlying chronic venous insufficiency and lymphedema very significant lower extremity edema had a hematoma after a fall on her left upper lateral lower leg. She is on Eliquis for atrial fibrillation apparently with a history of a splenic infarct following with Dr. Berton [name] of cardiology. She has exhibited significant bleeding from the wound surface including ao Venous bleeder that required suturing short while ago. She has been using Dakin's wet-to-dry packing and over the surface of the wound. She saw Dr. 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 Wants to communicate with Dr. Mikey [name] when she returns. In a perfect world where she was not on Eliquis she requires a wound VAC with additional compression wraps but I understand the reluctance to do this because of the concerns of bleeding 4/28; the patient's wound actually looks better today using Dakin's wet-to-dry that she is changing twice a day she is wrapping this with Kerlix and Ace wrapping. She tells me she had 2 small bleeding areas which were part of the superficial wound that stopped this week with direct pressure. Other than that no major issues. In follow-up from discussion of last week Dr. 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 The bigger question  from my point of view is the wound VAC issue. As far as she knows and her daughter-in-law verifies that she has not had any bleeding from the deeper parts of the wound although the bleeding has been superficial including the one that sent her to the ER for stitches. She is concerned that a wound VAC would cause further bleeding and I cannot completely allay those concerns. 5/8; patient presents for follow-up. She has no issues or complaints today. She has been using Dakin's wet-to-dry dressings without issues. She denies signs of infection. 5/12; patient presents for follow-up. She continues to use Dakin's wet-to-dry dressings without issues. She denies signs of infection. She reports some issues with bleeding at times but this has improved. She denies signs of infection. 6/1; patient presents for follow-up. She was recently hospitalized for upper left leg thigh cellulitis. She was given IV cefepime and vancomycin and discharged on oral antibiotics. She has been using Dakin's wet-to-dry dressings to the left lower leg wound. She reports improvement in healing. She currently denies systemic signs of infection. 6/7; patient is using Dakin's wet-to-dry twice daily. In general this looks better than when I saw this a month or so ago however still considerable depth to the tunnel in her left leg. She is going for iron infusions ordered by her primary care doctor I believe 6/15; patient presents for follow-up. She has been using Dakin's wet-to-dry dressings. She has noted some scattered small areas that blister up and heal on her left lower leg. She has been using mupirocin ointment on them. Nothing open today. 6/23; patient's been using Dakin's wet-to-dry dressings to the tunneled wound and Hydrofera Blue to the opening. She has no issues or complaints today. 6/29; patient presents for follow-up. She has been using Dakin's wet-to-dry dressings to the tunneled wound and  Hydrofera Blue to the opening. She states that [facility] is sticking to the wound bed. She denies signs of infection. 7/7; patient presents for follow-up. She has been using Dakin's wet-to-dry dressings to the tunneled wound and PolyMem silver to the opening without issues. 7/13; patient presents for follow-up. She has been using PolyMem silver to the opening and the rope to the tunnel. She has no issues or complaints today. She denies signs of infection. 7/20; patient presents for follow-up. We have been using PolyMem silver to the wound bed. She has no issues or complaints today. She is receiving her custom compression garments tomorrow. 7/27; patient presents for follow-up. She has been using PolyMem silver to the wound bed. She is having her custom compression garments adjusted as these are not staying on very easily. 8/30; patient presents for follow-up. We have been using PolyMem silver to the wound bed. She is still waiting on her custom compression garments to be ordered. She denies signs of infection. 8/11; patient presents for follow-up. The wound VAC has been started and patient has been using this for the past week. Patient has home health who changes the wound VAC. She developed irritation to the periwound. DuoDERM was not being used to the periwound despite being ordered. 8/15; patient presents for follow-up. She restarted the wound VAC and has had improvement to the periwound with the use of DuoDERM. 8/24; patient presents for follow-up. She has been using the wound VAC. She has developed a wound just superior to the original wound. Likely as a result from the wound VAC. She denies signs of infection. 8/29; patient presents for follow-up. We have been using collagen to the wound  bed. She has held off on the wound VAC for the past week. 9/7; patient presents for follow-up. We have been using iodoform packing to the wound bed. She has mild tenderness to the periwound. She denies  systemic signs of infection. Electronic Signature(s) Signed: 04/28/2022 3:23:06 PM By: Kalman Shan DO Entered By: Kalman Shan on 04/28/2022 15:20:09 -------------------------------------------------------------------------------- Physical Exam Details Patient Name: Date of Service: Christine Debar [name] 04/28/2022 1:30 PM Medical Record Number: [id number] Patient Account Number: [id number] Date of Birth/Sex: Treating RN: [birth year], [name] (71 y.o. [name] Primary Care Provider: Deland Pretty Other Clinician: Referring Provider: Treating Provider/Extender: Judie Grieve in Treatment: 22 Constitutional respirations regular, non-labored and within target range for patient.. Cardiovascular 2+ dorsalis pedis/posterior tibialis pulses. Psychiatric pleasant and cooperative. Notes Left lower extremity: Open wound with granulation tissue at the opening that has depth of about 3 cm. Mild tenderness to the periwound. No increased warmth, erythema or purulent drainage. Electronic Signature(s) Signed: 04/28/2022 3:23:06 PM By: Kalman Shan DO Entered By: Kalman Shan on 04/28/2022 15:20:53 -------------------------------------------------------------------------------- Physician Orders Details Patient Name: Date of Service: [name], Christine Blinks [name] 04/28/2022 1:30 PM Medical Record Number: [id number] Patient Account Number: [id number] Date of Birth/Sex: Treating RN: [name]-[birth year] (71 y.o. [name] Primary Care Provider: Deland Pretty Other Clinician: Referring Provider: Treating Provider/Extender: Judie Grieve in Treatment: 22 Verbal / Phone Orders: No Diagnosis Coding ICD-10 Coding Code Description [phone] Non-pressure chronic ulcer of other part of left lower leg with fat layer exposed T79.8XXA Other early complications of trauma, initial encounter I87.312 Chronic venous hypertension (idiopathic) with ulcer of left lower  extremity I89.0 Lymphedema, not elsewhere classified I48.0 Paroxysmal atrial fibrillation Z79.01 Long term (current) use of anticoagulants Follow-up Appointments ppointment in 1 week. - Dr. Heber [place] and [place], Room 8 Thursday Return A ppointment in 2 weeks. - Dr. Heber [place] and [place], Room 8 Thursday Return A Anesthetic (In clinic) Topical Lidocaine 5% applied to wound bed Cellular or Tissue Based Products Wound #1 Left,Lateral Lower Leg Cellular or Tissue Based Product Type: - wound center to run insurance approval for organogenesis products Puraply AM and NuShield. Bathing/ Shower/ Hygiene May shower with protection but do not get wound dressing(s) wet. Negative Presssure Wound Therapy Discontinue wound vac - patient to call the number on the machine for pick up. Edema Control - Lymphedema / SCD / Other Elevate legs to the level of the heart or above for 30 minutes daily and/or when sitting, a frequency of: - 3-4 times a day throughout the day. Avoid standing for long periods of time. Moisturize legs daily. - both legs every night before bed. Home Health No change in wound care orders this week; continue Home Health for wound care. May utilize formulary equivalent dressing for wound treatment orders unless otherwise specified. - Plain packing strips with Dakin's Solution lgihtly pack daily to left leg. Other Home Health Orders/Instructions: - [facility] Wound Treatment Wound #1 - Lower Leg Wound Laterality: Left, Lateral Cleanser: Wound Cleanser (Home Health) Every Other Day/30 Days Discharge Instructions: Cleanse the wound with wound cleanser prior to applying a clean dressing using gauze sponges, not tissue or cotton balls. Peri-Wound Care: Ketoconazole Cream 2% Every Other Day/30 Days Discharge Instructions: Apply Ketoconazole or antifingal cream over the counter to periwound as needed for any skin irritation. Prim Dressing: Dakin's Solution 0.25%, 16 (oz) Every Other Day/30  Days ary Discharge Instructions: Moisten 1/4 plain packing strips with Dakin's solution Secondary Dressing: ABD Pad, 5x9  Every Other Day/30 Days Discharge Instructions: Apply over primary dressing as directed. Secondary Dressing: Woven Gauze Sponge, Non-Sterile 4x4 in [facility]) Every Other Day/30 Days Discharge Instructions: Apply over primary dressing as directed. Secured With: [employer], 4.5x3.1 (in/yd) Every Other Day/30 Days Discharge Instructions: Secure with Kerlix as directed. Secured With: 38M Medipore H Soft Cloth Surgical T ape, 4 x 10 (in/yd) Every Other Day/30 Days Discharge Instructions: Secure with tape as directed. Electronic Signature(s) Signed: 04/28/2022 3:23:06 PM By: Kalman Shan DO Entered By: Kalman Shan on 04/28/2022 15:21:09 -------------------------------------------------------------------------------- Problem List Details Patient Name: Date of Service: [name], Christine [name] 04/28/2022 1:30 PM Medical Record Number: [id number] Patient Account Number: [id number] Date of Birth/Sex: Treating RN: [name], [name] (71 y.o. [name] Primary Care Provider: Deland Pretty Other Clinician: Referring Provider: Treating Provider/Extender: Judie Grieve in Treatment: 22 Active Problems ICD-10 Encounter Code Description Active Date MDM Diagnosis [phone] Non-pressure chronic ulcer of other part of left lower leg with fat layer exposed3/31/2023 No Yes T79.8XXA Other early complications of trauma, initial encounter 11/19/2021 No Yes I87.312 Chronic venous hypertension (idiopathic) with ulcer of left lower extremity 04/28/2022 No Yes I89.0 Lymphedema, not elsewhere classified 04/28/2022 No Yes I48.0 Paroxysmal atrial fibrillation 11/19/2021 No Yes Z79.01 Long term (current) use of anticoagulants 11/19/2021 No Yes Inactive Problems Resolved Problems Electronic Signature(s) Signed: 04/28/2022 3:23:06 PM By: Kalman Shan DO Entered  By: Kalman Shan on 04/28/2022 15:19:23 -------------------------------------------------------------------------------- Progress Note Details Patient Name: Date of Service: Christine Debar [name] 04/28/2022 1:30 PM Medical Record Number: [id number] Patient Account Number: [id number] Date of Birth/Sex: Treating RN: [birth year]-[name] (71 y.o. [name] Primary Care Provider: Deland Pretty Other Clinician: Referring Provider: Treating Provider/Extender: Judie Grieve in Treatment: 22 Subjective Chief Complaint Information obtained from Patient 11/19/2021; Left lower extremity wound status post fall History of Present Illness (HPI) Admission 11/19/2021 Ms. Hesta Adamson is a 71 year old female with a past medical history of paroxysmal A-fib on Eliquis, hypothyroidism, major depressive disorder, venous insufficiency and chronic diastolic heart failure that presents to the clinic for a 1 month history of nonhealing wound to the left lower extremity. She visited the ED on 10/18/2021 after a mechanical fall. She developed a hematoma that subsequently opened. She was hospitalized for 7 days and discharged on 10/25/2021. She has been on several different antibiotics For the past month. She states that most recently she was on Levaquin and linezolid for the past week. She states she completes her antibiotic course tomorrow. She has been using Dakin's wet-to-dry dressings to the wound bed. She denies signs of infection. 4/7; patient presents for follow-up. She has been using Dakin's wet-to-dry dressings. She did end up going to the ED on 4/1 because she had excess bleeding with dressing change that she could not stop. She is on Eliquis for A-fib. In the ED they tied off a small artery. She has had no issues since discharge. She denies signs of infection. 4/14; this is a very difficult clinical situation. A patient with underlying chronic venous insufficiency and lymphedema very  significant lower extremity edema had a hematoma after a fall on her left upper lateral lower leg. She is on Eliquis for atrial fibrillation apparently with a history of a splenic infarct following with Dr. Jolyn Nap of cardiology. She has exhibited significant bleeding from the wound surface including ao Venous bleeder that required suturing short while ago. She has been using Dakin's wet-to-dry packing and over the surface of the  wound. She saw Dr. Caryl Comes yesterday he is reluctant to consider stopping the Eliquis because of the prior history of presumed cardioembolism. Wants to communicate with Dr. Heber [place] when she returns. In a perfect world where she was not on Eliquis she requires a wound VAC with additional compression wraps but I understand the reluctance to do this because of the concerns of bleeding 4/28; the patient's wound actually looks better today using Dakin's wet-to-dry that she is changing twice a day she is wrapping this with Kerlix and Ace wrapping. She tells me she had 2 small bleeding areas which were part of the superficial wound that stopped this week with direct pressure. Other than that no major issues. In follow-up from discussion of last week Dr. 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 The bigger question from my point of view is the wound VAC issue. As far as she knows and her daughter-in-law verifies that she has not had any bleeding from the deeper parts of the wound although the bleeding has been superficial including the one that sent her to the ER for stitches. She is concerned that a wound VAC would cause further bleeding and I cannot completely allay those concerns. 5/8; patient presents for follow-up. She has no issues or complaints today. She has been using Dakin's wet-to-dry dressings without  issues. She denies signs of infection. 5/12; patient presents for follow-up. She continues to use Dakin's wet-to-dry dressings without issues. She denies signs of infection. She reports some issues with bleeding at times but this has improved. She denies signs of infection. 6/1; patient presents for follow-up. She was recently hospitalized for upper left leg thigh cellulitis. She was given IV cefepime and vancomycin and discharged on oral antibiotics. She has been using Dakin's wet-to-dry dressings to the left lower leg wound. She reports improvement in healing. She currently denies systemic signs of infection. 6/7; patient is using Dakin's wet-to-dry twice daily. In general this looks better than when I saw this a month or so ago however still considerable depth to the tunnel in her left leg. She is going for iron infusions ordered by her primary care doctor I believe 6/15; patient presents for follow-up. She has been using Dakin's wet-to-dry dressings. She has noted some scattered small areas that blister up and heal on her left lower leg. She has been using mupirocin ointment on them. Nothing open today. 6/23; patient's been using Dakin's wet-to-dry dressings to the tunneled wound and Hydrofera Blue to the opening. She has no issues or complaints today. 6/29; patient presents for follow-up. She has been using Dakin's wet-to-dry dressings to the tunneled wound and Hydrofera Blue to the opening. She states that [name] is sticking to the wound bed. She denies signs of infection. 7/7; patient presents for follow-up. She has been using Dakin's wet-to-dry dressings to the tunneled wound and PolyMem silver to the opening without issues. 7/13; patient presents for follow-up. She has been using PolyMem silver to the opening and the rope to the tunnel. She has no issues or complaints today. She denies signs of infection. 7/20; patient presents for follow-up. We have been using PolyMem silver to the  wound bed. She has no issues or complaints today. She is receiving her custom compression garments tomorrow. 7/27; patient presents for follow-up. She has been using PolyMem silver to  the wound bed. She is having her custom compression garments adjusted as these are not staying on very easily. 8/30; patient presents for follow-up. We have been using PolyMem silver to the wound bed. She is still waiting on her custom compression garments to be ordered. She denies signs of infection. 8/11; patient presents for follow-up. The wound VAC has been started and patient has been using this for the past week. Patient has home health who changes the wound VAC. She developed irritation to the periwound. DuoDERM was not being used to the periwound despite being ordered. 8/15; patient presents for follow-up. She restarted the wound VAC and has had improvement to the periwound with the use of DuoDERM. 8/24; patient presents for follow-up. She has been using the wound VAC. She has developed a wound just superior to the original wound. Likely as a result from the wound VAC. She denies signs of infection. 8/29; patient presents for follow-up. We have been using collagen to the wound bed. She has held off on the wound VAC for the past week. 9/7; patient presents for follow-up. We have been using iodoform packing to the wound bed. She has mild tenderness to the periwound. She denies systemic signs of infection. Patient History Medical History Cardiovascular Patient has history of Congestive Heart Failure, Hypertension Hospitalization/Surgery History - Cellulitis left leg- 01/03/2022-01/07/2022. Medical A Surgical History Notes nd Constitutional Symptoms (General Health) Infarction of spleen Hematologic/Lymphatic Hypothyroidism Cardiovascular A-Fib Gastrointestinal Gastroesophageal reflux Genitourinary Chronic kidney disease Musculoskeletal Osteoarthritis of  knee Psychiatric Anxiety Objective Constitutional respirations regular, non-labored and within target range for patient.. Vitals Time Taken: 1:53 PM, Height: 67 in, Weight: 340 lbs, BMI: 53.2, Temperature: 98.2 F, Pulse: 57 bpm, Respiratory Rate: 18 breaths/min, Blood Pressure: 162/85 mmHg. Cardiovascular 2+ dorsalis pedis/posterior tibialis pulses. Psychiatric pleasant and cooperative. General Notes: Left lower extremity: Open wound with granulation tissue at the opening that has depth of about 3 cm. Mild tenderness to the periwound. No increased warmth, erythema or purulent drainage. Integumentary (Hair, Skin) Wound #1 status is Open. Original cause of wound was Trauma. The date acquired was: 10/18/2021. The wound has been in treatment 22 weeks. The wound is located on the Left,Lateral Lower Leg. The wound measures 0.4cm length x 0.4cm width x 0.5cm depth; 0.126cm^2 area and 0.063cm^3 volume. There is Fat Layer (Subcutaneous Tissue) exposed. There is no undermining noted, however, there is tunneling at 11:00 with a maximum distance of 3.5cm. There is a medium amount of serosanguineous drainage noted. The wound margin is distinct with the outline attached to the wound base. There is large (67-100%) red granulation within the wound bed. There is no necrotic tissue within the wound bed. Assessment Active Problems ICD-10 Non-pressure chronic ulcer of other part of left lower leg with fat layer exposed Other early complications of trauma, initial encounter Chronic venous hypertension (idiopathic) with ulcer of left lower extremity Lymphedema, not elsewhere classified Paroxysmal atrial fibrillation Long term (current) use of anticoagulants Patient's wound is stable. She has some tenderness on exam however no signs of infection. I recommended at this time doing Dakin's wet-to-dry packing strips. I think she would also benefit from a skin substitute. We discussed this and she was agreeable  to run IVR for organogenesis products. Follow-up in 1 week. Plan Follow-up Appointments: Return Appointment in 1 week. - Dr. Heber [place] and [place], Room 8 Thursday Return Appointment in 2 weeks. - Dr. Heber [place] and [place], Room 8 Thursday Anesthetic: (In clinic) Topical Lidocaine 5% applied to wound bed Cellular or  Tissue Based Products: Wound #1 Left,Lateral Lower Leg: Cellular or Tissue Based Product Type: - wound center to run insurance approval for organogenesis products Puraply AM and NuShield. Bathing/ Shower/ Hygiene: May shower with protection but do not get wound dressing(s) wet. Negative Presssure Wound Therapy: Discontinue wound vac - patient to call the number on the machine for pick up. Edema Control - Lymphedema / SCD / Other: Elevate legs to the level of the heart or above for 30 minutes daily and/or when sitting, a frequency of: - 3-4 times a day throughout the day. Avoid standing for long periods of time. Moisturize legs daily. - both legs every night before bed. Home Health: No change in wound care orders this week; continue Home Health for wound care. May utilize formulary equivalent dressing for wound treatment orders unless otherwise specified. - Plain packing strips with Dakin's Solution lgihtly pack daily to left leg. Other Home Health Orders/Instructions: - [facility] WOUND #1: - Lower Leg Wound Laterality: Left, Lateral Cleanser: Wound Cleanser (Home Health) Every Other Day/30 Days Discharge Instructions: Cleanse the wound with wound cleanser prior to applying a clean dressing using gauze sponges, not tissue or cotton balls. Peri-Wound Care: Ketoconazole Cream 2% Every Other Day/30 Days Discharge Instructions: Apply Ketoconazole or antifingal cream over the counter to periwound as needed for any skin irritation. Prim Dressing: Dakin's Solution 0.25%, 16 (oz) Every Other Day/30 Days ary Discharge Instructions: Moisten 1/4 plain packing strips with Dakin's  solution Secondary Dressing: ABD Pad, 5x9 Every Other Day/30 Days Discharge Instructions: Apply over primary dressing as directed. Secondary Dressing: Woven Gauze Sponge, Non-Sterile 4x4 in [facility]) Every Other Day/30 Days Discharge Instructions: Apply over primary dressing as directed. Secured With: [employer], 4.5x3.1 (in/yd) Every Other Day/30 Days Discharge Instructions: Secure with Kerlix as directed. Secured With: 55M Medipore H Soft Cloth Surgical T ape, 4 x 10 (in/yd) Every Other Day/30 Days Discharge Instructions: Secure with tape as directed. 1. Dakin's wet-to-dry packing strips 2. Run IVR for organogenesis products 3. Follow-up in 1 week Electronic Signature(s) Signed: 04/28/2022 3:23:06 PM By: Kalman Shan DO Entered By: Kalman Shan on 04/28/2022 15:22:23 -------------------------------------------------------------------------------- HxROS Details Patient Name: Date of Service: [name], Christine [name] 04/28/2022 1:30 PM Medical Record Number: [id number] Patient Account Number: [id number] Date of Birth/Sex: Treating RN: [birth year], [name] (71 y.o. [name] Primary Care Provider: Deland Pretty Other Clinician: Referring Provider: Treating Provider/Extender: Judie Grieve in Treatment: 22 Constitutional Symptoms (General Health) Medical History: Past Medical History Notes: Infarction of spleen Hematologic/Lymphatic Medical History: Past Medical History Notes: Hypothyroidism Cardiovascular Medical History: Positive for: Congestive Heart Failure; Hypertension Past Medical History Notes: A-Fib Gastrointestinal Medical History: Past Medical History Notes: Gastroesophageal reflux Genitourinary Medical History: Past Medical History Notes: Chronic kidney disease Musculoskeletal Medical History: Past Medical History Notes: Osteoarthritis of knee Psychiatric Medical History: Past Medical History  Notes: Anxiety Immunizations Pneumococcal Vaccine: Received Pneumococcal Vaccination: No Implantable Devices No devices added Hospitalization / Surgery History Type of Hospitalization/Surgery Cellulitis left leg- 01/03/2022-01/07/2022 Electronic Signature(s) Signed: 04/28/2022 3:23:06 PM By: Kalman Shan DO Signed: 04/28/2022 4:40:53 PM By: Deon Pilling RN, BSN Entered By: Kalman Shan on 04/28/2022 15:20:13 -------------------------------------------------------------------------------- [place]ary Details Patient Name: Date of Service: Christine Simmonds J. 04/28/2022 Medical Record Number: [id number] Patient Account Number: [id number] Date of Birth/Sex: Treating RN: [name][birth year] (71 y.o. 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 I89.0 Lymphedema, not elsewhere classified I48.0 Paroxysmal atrial fibrillation Z79.01 Long term (current) use of anticoagulants Facility Procedures CPT4 Code: [id number] Description: 99213 - WOUND CARE VISIT-LEV 3 EST PT Modifier: Quantity: 1 Physician Procedures : CPT4 Code Description Modifier [id number] - WC PHYS LEVEL 3 - EST PT ICD-10 Diagnosis Description L97.822 Non-pressure chronic ulcer of other part of left lower leg with fat layer exposed T79.8XXA Other early complications of trauma, initial  encounter I87.312 Chronic venous hypertension (idiopathic) with ulcer of left lower extremity I89.0 Lymphedema, not elsewhere classified Quantity: 1 Electronic Signature(s) Signed: 04/28/2022 3:23:06 PM By: Kalman Shan DO Entered By:  Kalman Shan on 04/28/2022 15:22:38

## 2022-04-29 NOTE — Progress Notes (Signed)
[name] ([id number]) Visit Report for 04/28/2022 Arrival Information Details Patient Name: Date of Service: [name] 04/28/2022 1:30 PM Medical Record Number: [id number] Patient Account Number: [id number] Date of Birth/Sex: Treating RN: [birth year] (71 y.o. [name], Meta.Reding Primary Care Khaila Velarde: Deland Pretty Other Clinician: Referring Shanelle Clontz: Treating Allanna Bresee/Extender: Judie Grieve in Treatment: 22 Visit Information History Since Last Visit Added or deleted any medications: No Patient Arrived: [name] Any new allergies or adverse reactions: No Arrival Time: 13:48 Had a fall or experienced change in No Accompanied By: son activities of daily living that may affect Transfer Assistance: None risk of falls: Patient Identification Verified: Yes Signs or symptoms of abuse/neglect since last visito No Secondary Verification Process Completed: Yes Hospitalized since last visit: No Patient Requires Transmission-Based Precautions: No Implantable device outside of the clinic excluding No Patient Has Alerts: Yes cellular tissue based products placed in the center Patient Alerts: Patient on Blood Thinner since last visit: Has Dressing in Place as Prescribed: Yes Pain Present Now: No Electronic Signature(s) Signed: 04/29/2022 12:32:37 PM By: Erenest Blank Entered By: Erenest Blank on 04/28/2022 13:52:41 -------------------------------------------------------------------------------- Clinic Level of Care Assessment Details Patient Name: Date of Service: [name] 04/28/2022 1:30 PM Medical Record Number: [id number] Patient Account Number: [id number] Date of Birth/Sex: Treating RN: [birth year] (71 y.o. [name], Meta.Reding Primary Care Nile Dorning: Deland Pretty Other Clinician: Referring Keneth Borg: Treating Cagney Steenson/Extender: Judie Grieve in Treatment: 22 Clinic Level of Care Assessment Items TOOL 4 Quantity  Score X- 1 0 Use when only an EandM is performed on FOLLOW-UP visit ASSESSMENTS - Nursing Assessment / Reassessment X- 1 10 Reassessment of Co-morbidities (includes updates in patient status) X- 1 5 Reassessment of Adherence to Treatment Plan ASSESSMENTS - Wound and Skin A ssessment / Reassessment X - Simple Wound Assessment / Reassessment - one wound 1 5 _0  - 0 Complex Wound Assessment / Reassessment - multiple wounds X- 1 10 Dermatologic / Skin Assessment (not related to wound area) ASSESSMENTS - Focused Assessment X- 1 5 Circumferential Edema Measurements - multi extremities _1  - 0 Nutritional Assessment / Counseling / Intervention _2  - 0 Lower Extremity Assessment (monofilament, tuning fork, pulses) _3  - 0 Peripheral Arterial Disease Assessment (using hand held doppler) ASSESSMENTS - Ostomy and/or Continence Assessment and Care _4  - 0 Incontinence Assessment and Management _5  - 0 Ostomy Care Assessment and Management (repouching, etc.) PROCESS - Coordination of Care X - Simple Patient / Family Education for ongoing care 1 15 _6  - 0 Complex (extensive) Patient / Family Education for ongoing care X- 1 10 Staff obtains Programmer, systems, Records, T Results / Process Orders est X- 1 10 Staff telephones HHA, Nursing Homes / Clarify orders / etc _7  - 0 Routine Transfer to another Facility (non-emergent condition) _8  - 0 Routine Hospital Admission (non-emergent condition) _9  - 0 New Admissions / Biomedical engineer / Ordering NPWT Apligraf, etc. , _10  - 0 Emergency Hospital Admission (emergent condition) X- 1 10 Simple Discharge Coordination _11  - 0 Complex (extensive) Discharge Coordination PROCESS - Special Needs _12  - 0 Pediatric / Minor Patient Management _13  - 0 Isolation Patient Management _14  - 0 Hearing / Language / Visual special needs _15  - 0 Assessment of Community assistance (transportation, D/C planning, etc.) _16  - 0 Additional assistance / Altered  mentation _17  - 0 Support Surface(s) Assessment (bed, cushion, seat, etc.) INTERVENTIONS - Wound Cleansing / Measurement X - Simple Wound Cleansing - one wound 1 5 _18  - 0  Complex Wound Cleansing - multiple wounds X- 1 5 Wound Imaging (photographs - any number of wounds) _0  - 0 Wound Tracing (instead of photographs) X- 1 5 Simple Wound Measurement - one wound _1  - 0 Complex Wound Measurement - multiple wounds INTERVENTIONS - Wound Dressings X - Small Wound Dressing one or multiple wounds 1 10 _2  - 0 Medium Wound Dressing one or multiple wounds _3  - 0 Large Wound Dressing one or multiple wounds <GDJMEQASTMHDQQIW>_9<\/NLGXQJJHERDEYCXK>_4  - 0 Application of Medications - topical <YJEHUDJSHFWYOVZC>_5<\/YIFOYDXAJOINOMVE>_7  - 0 Application of Medications - injection INTERVENTIONS - Miscellaneous _6  - 0 External ear exam _7  - 0 Specimen Collection (cultures, biopsies, blood, body fluids, etc.) _8  - 0 Specimen(s) / Culture(s) sent or taken to Lab for analysis _9  - 0 Patient Transfer (multiple staff / Civil Service fast streamer / Similar devices) _10  - 0 Simple Staple / Suture removal (25 or less) _11  - 0 Complex Staple / Suture removal (26 or more) _12  - 0 Hypo / Hyperglycemic Management (close monitor of Blood Glucose) _13  - 0 Ankle / Brachial Index (ABI) - do not check if billed separately X- 1 5 Vital Signs Has the patient been seen at the hospital within the last three years: Yes Total Score: 110 Level Of Care: New/Established - Level 3 Electronic Signature(s) Signed: 04/28/2022 4:40:53 PM By: Deon Pilling RN, BSN Entered By: Deon Pilling on 04/28/2022 14:27:36 -------------------------------------------------------------------------------- Encounter Discharge Information Details Patient Name: Date of Service: Link Snuffer, Erick Blinks [name] 04/28/2022 1:30 PM Medical Record Number: [id number] Patient Account Number: [id number] Date of Birth/Sex: Treating RN: 12-03-1950 (71 y.o. [name] Primary Care Dacia Capers: Deland Pretty Other Clinician: Referring  Nicholson Starace: Treating Josel Keo/Extender: Judie Grieve in Treatment: 22 Encounter Discharge Information Items Discharge Condition: Stable Ambulatory Status: Walker Discharge Destination: Home Transportation: Private Auto Accompanied By: self Schedule Follow-up Appointment: Yes Clinical Summary of Care: Electronic Signature(s) Signed: 04/28/2022 4:40:53 PM By: Deon Pilling RN, BSN Entered By: Deon Pilling on 04/28/2022 14:28:07 -------------------------------------------------------------------------------- Lower Extremity Assessment Details Patient Name: Date of Service: [name] 04/28/2022 1:30 PM Medical Record Number: [id number] Patient Account Number: [id number] Date of Birth/Sex: Treating RN: 01-18-1951 (71 y.o. [name] Primary Care Catherina Pates: Deland Pretty Other Clinician: Referring Weber Monnier: Treating Dilpreet Faires/Extender: Judie Grieve in Treatment: 22 Edema Assessment Assessed: Shirlyn Goltz: No] Patrice Paradise: No] Edema: [Left: Ye] [Right: s] Calf Left: Right: Point of Measurement: 31 cm From Medial Instep 47 cm Ankle Left: Right: Point of Measurement: 9 cm From Medial Instep 26 cm Vascular Assessment Pulses: Dorsalis Pedis Palpable: [Left:Yes] Electronic Signature(s) Signed: 04/28/2022 4:40:53 PM By: Deon Pilling RN, BSN Signed: 04/29/2022 12:32:37 PM By: Erenest Blank Entered By: Erenest Blank on 04/28/2022 14:05:57 -------------------------------------------------------------------------------- Multi Wound Chart Details Patient Name: Date of Service: [name] 04/28/2022 1:30 PM Medical Record Number: [id number] Patient Account Number: [id number] Date of Birth/Sex: Treating RN: 04-27-1951 (71 y.o. [name] Primary Care Horace Wishon: Deland Pretty Other Clinician: Referring Eliannah Hinde: Treating Jad Johansson/Extender: Judie Grieve in Treatment: 22 Vital Signs Height(in):  67 Pulse(bpm): 83 Weight(lbs): 340 Blood Pressure(mmHg): 162/85 Body Mass Index(BMI): 53.2 Temperature(F): 98.2 Respiratory Rate(breaths/min): 18 Photos: [N/A:N/A] Left, Lateral Lower Leg N/A N/A Wound Location: Trauma N/A N/A Wounding Event: Trauma, Other N/A N/A Primary Etiology: Congestive Heart Failure, N/A N/A Comorbid History: Hypertension 10/18/2021 N/A N/A Date Acquired: 22 N/A N/A Weeks of Treatment: Open N/A N/A Wound Status: No N/A N/A Wound Recurrence: Yes N/A N/A Clustered Wound: 1 N/A N/A Clustered  Quantity: 0.4x0.4x0.5 N/A N/A Measurements L x W x D (cm) 0.126 N/A N/A A (cm) : rea 0.063 N/A N/A Volume (cm) : 99.90% N/A N/A % Reduction in A rea: 100.00% N/A N/A % Reduction in Volume: 11 Position 1 (o'clock): 3.5 Maximum Distance 1 (cm): Yes N/A N/A Tunneling: Full Thickness Without Exposed N/A N/A Classification: Support Structures Medium N/A N/A Exudate Amount: Serosanguineous N/A N/A Exudate Type: red, brown N/A N/A Exudate Color: Distinct, outline attached N/A N/A Wound Margin: Large (67-100%) N/A N/A Granulation Amount: Red N/A N/A Granulation Quality: None Present (0%) N/A N/A Necrotic Amount: Fat Layer (Subcutaneous Tissue): Yes N/A N/A Exposed Structures: Fascia: No Tendon: No Muscle: No Joint: No Bone: No Large (67-100%) N/A N/A Epithelialization: Treatment Notes Wound #1 (Lower Leg) Wound Laterality: Left, Lateral Cleanser Wound Cleanser Discharge Instruction: Cleanse the wound with wound cleanser prior to applying a clean dressing using gauze sponges, not tissue or cotton balls. Peri-Wound Care Ketoconazole Cream 2% Discharge Instruction: Apply Ketoconazole or antifingal cream over the counter to periwound as needed for any skin irritation. Topical Primary Dressing Dakin's Solution 0.25%, 16 (oz) Discharge Instruction: Moisten 1/4 plain packing strips with Dakin's solution Secondary Dressing ABD Pad,  5x9 Discharge Instruction: Apply over primary dressing as directed. Woven Gauze Sponge, Non-Sterile 4x4 in Discharge Instruction: Apply over primary dressing as directed. Secured With [employer], 4.5x3.1 (in/yd) Discharge Instruction: Secure with Kerlix as directed. 68M Medipore H Soft Cloth Surgical T ape, 4 x 10 (in/yd) Discharge Instruction: Secure with tape as directed. Compression Wrap Compression Stockings Add-Ons Electronic Signature(s) Signed: 04/28/2022 3:23:06 PM By: Kalman Shan DO Signed: 04/28/2022 4:40:53 PM By: Deon Pilling RN, BSN Entered By: Kalman Shan on 04/28/2022 15:19:26 -------------------------------------------------------------------------------- Multi-Disciplinary Care Plan Details Patient Name: Date of Service: Link Snuffer, Erick Blinks [name] 04/28/2022 1:30 PM Medical Record Number: [id number] Patient Account Number: [id number] Date of Birth/Sex: Treating RN: September 11, 1950 (71 y.o. [name], Meta.Reding Primary Care Kayse Puccini: Deland Pretty Other Clinician: Referring Quentavious Rittenhouse: Treating Guilianna Mckoy/Extender: Judie Grieve in Treatment: 22 Active Inactive Abuse / Safety / Falls / Self Care Management Nursing Diagnoses: History of Falls Potential for falls Goals: Patient/caregiver will verbalize/demonstrate measure taken to improve self care Date Initiated: 11/19/2021 Date Inactivated: 04/05/2022 Target Resolution Date: 04/21/2022 Goal Status: Met Patient/caregiver will verbalize/demonstrate measures taken to prevent injury and/or falls Date Initiated: 11/19/2021 Target Resolution Date: 05/06/2022 Goal Status: Active Interventions: Provide education on basic hygiene Provide education on fall prevention Provide education on HBO safety Provide education on vaccinations Notes: Pain, Acute or Chronic Nursing Diagnoses: Pain, acute or chronic: actual or potential Potential alteration in comfort, pain Goals: Patient will verbalize  adequate pain control and receive pain control interventions during procedures as needed Date Initiated: 11/19/2021 Date Inactivated: 04/05/2022 Target Resolution Date: 04/22/2022 Goal Status: Met Patient/caregiver will verbalize comfort level met Date Initiated: 11/19/2021 Target Resolution Date: 05/20/2022 Goal Status: Active Interventions: Encourage patient to take pain medications as prescribed Provide education on pain management Reposition patient for comfort Treatment Activities: Administer pain control measures as ordered : 11/19/2021 Notes: Electronic Signature(s) Signed: 04/28/2022 4:40:53 PM By: Deon Pilling RN, BSN Entered By: Deon Pilling on 04/28/2022 14:08:48 -------------------------------------------------------------------------------- Pain Assessment Details Patient Name: Date of Service: [name] 04/28/2022 1:30 PM Medical Record Number: [id number] Patient Account Number: [id number] Date of Birth/Sex: Treating RN: 1951-03-30 (71 y.o. [name] Primary Care Karie Skowron: Deland Pretty Other Clinician: Referring Andrius Andrepont: Treating Jden Want/Extender: Judie Grieve in Treatment: 22 Active  Problems Location of Pain Severity and Description of Pain Patient Has Paino No Site Locations Pain Management and Medication Current Pain Management: Electronic Signature(s) Signed: 04/28/2022 4:40:53 PM By: Deon Pilling RN, BSN Signed: 04/29/2022 12:32:37 PM By: Erenest Blank Entered By: Erenest Blank on 04/28/2022 13:53:55 -------------------------------------------------------------------------------- Patient/Caregiver Education Details Patient Name: Date of Service: [name] 9/7/2023andnbsp1:30 PM Medical Record Number: [id number] Patient Account Number: [id number] Date of Birth/Gender: Treating RN: 1951/07/13 (71 y.o. [name] Primary Care Physician: Deland Pretty Other Clinician: Referring Physician: Treating  Physician/Extender: Judie Grieve in Treatment: 22 Education Assessment Education Provided To: Patient Education Topics Provided Safety: Handouts: Medication Safety Methods: Explain/Verbal Responses: Reinforcements needed Engineer, maintenance) Signed: 04/28/2022 4:40:53 PM By: Deon Pilling RN, BSN Entered By: Deon Pilling on 04/28/2022 14:26:54 -------------------------------------------------------------------------------- Wound Assessment Details Patient Name: Date of Service: [name] 04/28/2022 1:30 PM Medical Record Number: [id number] Patient Account Number: [id number] Date of Birth/Sex: Treating RN: [birth year] (71 y.o. [name], Meta.Reding Primary Care Whitni Pasquini: Deland Pretty Other Clinician: Referring Ruqayya Ventress: Treating Gaelle Adriance/Extender: Judie Grieve in Treatment: 22 Wound Status Wound Number: 1 Primary Etiology: Trauma, Other Wound Location: Left, Lateral Lower Leg Wound Status: Open Wounding Event: Trauma Comorbid History: Congestive Heart Failure, Hypertension Date Acquired: 10/18/2021 Weeks Of Treatment: 22 Clustered Wound: Yes Photos Wound Measurements Length: (cm) 0.4 Width: (cm) 0.4 Depth: (cm) 0.5 Clustered Quantity: 1 Area: (cm) 0.126 Volume: (cm) 0.063 % Reduction in Area: 99.9% % Reduction in Volume: 100% Epithelialization: Large (67-100%) Tunneling: Yes Position (o'clock): 11 Maximum Distance: (cm) 3.5 Undermining: No Wound Description Classification: Full Thickness Without Exposed Support Structures Wound Margin: Distinct, outline attached Exudate Amount: Medium Exudate Type: Serosanguineous Exudate Color: red, brown Foul Odor After Cleansing: No Slough/Fibrino No Wound Bed Granulation Amount: Large (67-100%) Exposed Structure Granulation Quality: Red Fascia Exposed: No Necrotic Amount: None Present (0%) Fat Layer (Subcutaneous Tissue) Exposed: Yes Tendon Exposed: No Muscle  Exposed: No Joint Exposed: No Bone Exposed: No Treatment Notes Wound #1 (Lower Leg) Wound Laterality: Left, Lateral Cleanser Wound Cleanser Discharge Instruction: Cleanse the wound with wound cleanser prior to applying a clean dressing using gauze sponges, not tissue or cotton balls. Peri-Wound Care Ketoconazole Cream 2% Discharge Instruction: Apply Ketoconazole or antifingal cream over the counter to periwound as needed for any skin irritation. Topical Primary Dressing Dakin's Solution 0.25%, 16 (oz) Discharge Instruction: Moisten 1/4 plain packing strips with Dakin's solution Secondary Dressing ABD Pad, 5x9 Discharge Instruction: Apply over primary dressing as directed. Woven Gauze Sponge, Non-Sterile 4x4 in Discharge Instruction: Apply over primary dressing as directed. Secured With [employer], 4.5x3.1 (in/yd) Discharge Instruction: Secure with Kerlix as directed. 80M Medipore H Soft Cloth Surgical T ape, 4 x 10 (in/yd) Discharge Instruction: Secure with tape as directed. Compression Wrap Compression Stockings Add-Ons Electronic Signature(s) Signed: 04/28/2022 4:40:53 PM By: Deon Pilling RN, BSN Signed: 04/29/2022 12:32:37 PM By: Erenest Blank Entered By: Erenest Blank on 04/28/2022 14:10:49 -------------------------------------------------------------------------------- Vitals Details Patient Name: Date of Service: Link Snuffer, [name] 04/28/2022 1:30 PM Medical Record Number: [id number] Patient Account Number: [id number] Date of Birth/Sex: Treating RN: Dec 28, 1950 (71 y.o. [name], Meta.Reding Primary Care Kalia Vahey: Deland Pretty Other Clinician: Referring Harmoni Lucus: Treating Indigo Chaddock/Extender: Judie Grieve in Treatment: 22 Vital Signs Time Taken: 13:53 Temperature (F): 98.2 Height (in): 67 Pulse (bpm): 57 Weight (lbs): 340 Respiratory Rate (breaths/min): 18 Body Mass Index (BMI): 53.2 Blood Pressure (mmHg): 162/85 Reference Range:  80 - 120  mg / dl Electronic Signature(s) Signed: 04/29/2022 12:32:37 PM By: Erenest Blank Entered By: Erenest Blank on 04/28/2022 13:53:48

## 2022-05-03 ENCOUNTER — Encounter (HOSPITAL_BASED_OUTPATIENT_CLINIC_OR_DEPARTMENT_OTHER): Payer: PPO | Admitting: Internal Medicine

## 2022-05-03 DIAGNOSIS — L97822 Non-pressure chronic ulcer of other part of left lower leg with fat layer exposed: Secondary | ICD-10-CM | POA: Diagnosis not present

## 2022-05-03 DIAGNOSIS — I87312 Chronic venous hypertension (idiopathic) with ulcer of left lower extremity: Secondary | ICD-10-CM | POA: Diagnosis not present

## 2022-05-03 DIAGNOSIS — I890 Lymphedema, not elsewhere classified: Secondary | ICD-10-CM | POA: Diagnosis not present

## 2022-05-03 DIAGNOSIS — T798XXA Other early complications of trauma, initial encounter: Secondary | ICD-10-CM

## 2022-05-03 NOTE — Progress Notes (Signed)
[name], [name] ([id number]) Visit Report for 05/03/2022 Arrival Information Details Patient Name: Date of Service: [name] 05/03/2022 11:15 A M Medical Record Number: [id number] Patient Account Number: [id number] Date of Birth/Sex: Treating RN: [birth year] (71 y.o. F) Primary Care [name]: [name] Other Clinician: Referring [name]: Treating [name]/Extender: [name] in Treatment: 23 Visit Information History Since Last Visit Added or deleted any medications: No Patient Arrived: Walker Any new allergies or adverse reactions: No Arrival Time: 11:08 Had a fall or experienced change in No Accompanied By: son activities of daily living that may affect Transfer Assistance: None risk of falls: Patient Identification Verified: Yes Signs or symptoms of abuse/neglect since last visito No Secondary Verification Process Completed: Yes Hospitalized since last visit: No Patient Requires Transmission-Based Precautions: No Implantable device outside of the clinic excluding No Patient Has Alerts: Yes cellular tissue based products placed in the center Patient Alerts: Patient on Blood Thinner since last visit: Has Dressing in Place as Prescribed: Yes Pain Present Now: No Electronic Signature(s) Signed: 05/03/2022 4:33:24 PM By: [name] Entered By: [name] on 05/03/2022 11:10:14 -------------------------------------------------------------------------------- Clinic Level of Care Assessment Details Patient Name: Date of Service: [name] 05/03/2022 11:15 A M Medical Record Number: [id number] Patient Account Number: [id number] Date of Birth/Sex: Treating RN: [name] (71 y.o. [name], Meta.Reding Primary Care Elisheva Fallas: [name] Other Clinician: Referring Estle Sabella: Treating [name]/Extender: [name] in Treatment: 23 Clinic Level of Care Assessment Items TOOL 4 Quantity Score X- 1  0 Use when only an EandM is performed on FOLLOW-UP visit ASSESSMENTS - Nursing Assessment / Reassessment X- 1 10 Reassessment of [name]-morbidities (includes updates in patient status) X- 1 5 Reassessment of Adherence to Treatment Plan ASSESSMENTS - Wound and Skin A ssessment / Reassessment X - Simple Wound Assessment / Reassessment - one wound 1 5 []  - 0 Complex Wound Assessment / Reassessment - multiple wounds X- 1 10 Dermatologic / Skin Assessment (not related to wound area) ASSESSMENTS - Focused Assessment X- 1 5 Circumferential Edema Measurements - multi extremities []  - 0 Nutritional Assessment / Counseling / Intervention []  - 0 Lower Extremity Assessment (monofilament, tuning fork, pulses) []  - 0 Peripheral Arterial Disease Assessment (using hand held doppler) ASSESSMENTS - Ostomy and/or Continence Assessment and Care []  - 0 Incontinence Assessment and Management []  - 0 Ostomy Care Assessment and Management (repouching, etc.) PROCESS - Coordination of Care X - Simple Patient / Family Education for ongoing care 1 15 []  - 0 Complex (extensive) Patient / Family Education for ongoing care X- 1 10 Staff obtains Programmer, systems, Records, T Results / Process Orders est X- 1 10 Staff telephones HHA, Nursing Homes / Clarify orders / etc []  - 0 Routine Transfer to another Facility (non-emergent condition) []  - 0 Routine Hospital Admission (non-emergent condition) []  - 0 New Admissions / Biomedical engineer / Ordering NPWT Apligraf, etc. , []  - 0 Emergency Hospital Admission (emergent condition) X- 1 10 Simple Discharge Coordination []  - 0 Complex (extensive) Discharge Coordination PROCESS - Special Needs []  - 0 Pediatric / Minor Patient Management []  - 0 Isolation Patient Management []  - 0 Hearing / Language / Visual special needs []  - 0 Assessment of Community assistance (transportation, D/C planning, etc.) []  - 0 Additional assistance / Altered mentation []  -  0 Support Surface(s) Assessment (bed, cushion, seat, etc.) INTERVENTIONS - Wound Cleansing / Measurement X - Simple Wound Cleansing - one wound 1 5 []  - 0  Complex Wound Cleansing - multiple wounds X- 1 5 Wound Imaging (photographs - any number of wounds) []  - 0 Wound Tracing (instead of photographs) X- 1 5 Simple Wound Measurement - one wound []  - 0 Complex Wound Measurement - multiple wounds INTERVENTIONS - Wound Dressings X - Small Wound Dressing one or multiple wounds 1 10 []  - 0 Medium Wound Dressing one or multiple wounds []  - 0 Large Wound Dressing one or multiple wounds []  - 0 Application of Medications - topical []  - 0 Application of Medications - injection INTERVENTIONS - Miscellaneous []  - 0 External ear exam []  - 0 Specimen Collection (cultures, biopsies, blood, body fluids, etc.) []  - 0 Specimen(s) / Culture(s) sent or taken to Lab for analysis []  - 0 Patient Transfer (multiple staff / Civil Service fast streamer / Similar devices) []  - 0 Simple Staple / Suture removal (25 or less) []  - 0 Complex Staple / Suture removal (26 or more) []  - 0 Hypo / Hyperglycemic Management (close monitor of Blood Glucose) []  - 0 Ankle / Brachial Index (ABI) - do not check if billed separately X- 1 5 Vital Signs Has the patient been seen at the hospital within the last three years: Yes Total Score: 110 Level Of Care: New/Established - Level 3 Electronic Signature(s) Signed: 05/03/2022 5:16:45 PM By: Christine Pilling RN, BSN Entered By: [name] on 05/03/2022 11:27:50 -------------------------------------------------------------------------------- Encounter Discharge Information Details Patient Name: Date of Service: Christine Debar [name] 05/03/2022 11:15 A M Medical Record Number: [id number] Patient Account Number: [id number] Date of Birth/Sex: Treating RN: [name][birth year] (71 y.o. [name] Primary Care [name]: [name] Other Clinician: Referring Meko Masterson: Treating  [name]/Extender: [name] in Treatment: 23 Encounter Discharge Information Items Discharge Condition: Stable Ambulatory Status: Walker Discharge Destination: Home Transportation: Private Auto Accompanied By: self Schedule Follow-up Appointment: Yes Clinical Summary of Care: Electronic Signature(s) Signed: 05/03/2022 5:16:45 PM By: Christine Pilling RN, BSN Entered By: [name] on 05/03/2022 11:28:20 -------------------------------------------------------------------------------- Lower Extremity Assessment Details Patient Name: Date of Service: Christine Debar [name] 05/03/2022 11:15 A M Medical Record Number: [id number] Patient Account Number: [id number] Date of Birth/Sex: Treating RN: [name][birth year] (71 y.o. F) Primary Care Jacquiline Zurcher: [name] Other Clinician: Referring Gunner Iodice: Treating Smith Potenza/Extender: [name] in Treatment: 23 Edema Assessment Assessed: [Left: No] Patrice Paradise: No] Edema: [Left: Ye] [Right: s] Calf Left: Right: Point of Measurement: 31 cm From Medial Instep 47.2 cm Ankle Left: Right: Point of Measurement: 9 cm From Medial Instep 26 cm Electronic Signature(s) Signed: 05/03/2022 4:33:24 PM By: [name] Entered By: [name] on 05/03/2022 11:18:55 -------------------------------------------------------------------------------- Multi Wound Chart Details Patient Name: Date of Service: Christine Simmonds J. 05/03/2022 11:15 A M Medical Record Number: [id number] Patient Account Number: [id number] Date of Birth/Sex: Treating RN: [name] (71 y.o. F) Primary Care Nathan Stallworth: [name] Other Clinician: Referring [name] Aguayo: Treating Jaycey Gens/Extender: [name] in Treatment: 23 Vital Signs Height(in): 67 Pulse(bpm): 67 Weight(lbs): 340 Blood Pressure(mmHg): 147/72 Body Mass Index(BMI): 53.2 Temperature(F): 98 Respiratory Rate(breaths/min): 18 Photos:  [N/A:N/A] Left, Lateral Lower Leg N/A N/A Wound Location: Trauma N/A N/A Wounding Event: Trauma, Other N/A N/A Primary Etiology: Congestive Heart Failure, N/A N/A Comorbid History: Hypertension 10/18/2021 N/A N/A Date Acquired: 23 N/A N/A Weeks of Treatment: Open N/A N/A Wound Status: No N/A N/A Wound Recurrence: Yes N/A N/A Clustered Wound: 1 N/A N/A Clustered Quantity: 0.3x0.4x0.5 N/A N/A Measurements L x W x D (cm) 0.094 N/A N/A A (cm) :  rea 0.047 N/A N/A Volume (cm) : 100.00% N/A N/A % Reduction in A rea: 100.00% N/A N/A % Reduction in Volume: 11 Position 1 (o'clock): 3.5 Maximum Distance 1 (cm): Yes N/A N/A Tunneling: Full Thickness Without Exposed N/A N/A Classification: Support Structures Medium N/A N/A Exudate Amount: Serosanguineous N/A N/A Exudate Type: red, brown N/A N/A Exudate Color: Distinct, outline attached N/A N/A Wound Margin: Large (67-100%) N/A N/A Granulation Amount: Red N/A N/A Granulation Quality: None Present (0%) N/A N/A Necrotic Amount: Fat Layer (Subcutaneous Tissue): Yes N/A N/A Exposed Structures: Fascia: No Tendon: No Muscle: No Joint: No Bone: No Large (67-100%) N/A N/A Epithelialization: Treatment Notes Wound #1 (Lower Leg) Wound Laterality: Left, Lateral Cleanser Wound Cleanser Discharge Instruction: Cleanse the wound with wound cleanser prior to applying a clean dressing using gauze sponges, not tissue or cotton balls. Peri-Wound Care Ketoconazole Cream 2% Discharge Instruction: Apply Ketoconazole or antifingal cream over the counter to periwound as needed for any skin irritation. Topical Primary Dressing Dakin's Solution 0.25%, 16 (oz) Discharge Instruction: Moisten 1/4 plain packing strips with Dakin's solution Secondary Dressing ABD Pad, 5x9 Discharge Instruction: Apply over primary dressing as directed. Woven Gauze Sponge, Non-Sterile 4x4 in Discharge Instruction: Apply over primary dressing as  directed. Secured With [employer], 4.5x3.1 (in/yd) Discharge Instruction: Secure with Kerlix as directed. 70M Medipore H Soft Cloth Surgical T ape, 4 x 10 (in/yd) Discharge Instruction: Secure with tape as directed. Compression Wrap Compression Stockings Add-Ons Electronic Signature(s) Signed: 05/03/2022 11:34:21 AM By: Kalman Shan DO Entered By: Kalman Shan on 05/03/2022 11:29:51 -------------------------------------------------------------------------------- Multi-Disciplinary Care Plan Details Patient Name: Date of Service: Christine Debar [name] 05/03/2022 11:15 A M Medical Record Number: [id number] Patient Account Number: [id number] Date of Birth/Sex: Treating RN: [birth year] (71 y.o. [name], Meta.Reding Primary Care Ireanna Finlayson: [name] Other Clinician: Referring Auriel Kist: Treating Jb Dulworth/Extender: [name] in Treatment: 23 Active Inactive Abuse / Safety / Falls / Self Care Management Nursing Diagnoses: History of Falls Potential for falls Goals: Patient/caregiver will verbalize/demonstrate measure taken to improve self care Date Initiated: 11/19/2021 Date Inactivated: 04/05/2022 Target Resolution Date: 04/21/2022 Goal Status: Met Patient/caregiver will verbalize/demonstrate measures taken to prevent injury and/or falls Date Initiated: 11/19/2021 Target Resolution Date: 05/20/2022 Goal Status: Active Interventions: Provide education on basic hygiene Provide education on fall prevention Provide education on HBO safety Provide education on vaccinations Notes: Pain, Acute or Chronic Nursing Diagnoses: Pain, acute or chronic: actual or potential Potential alteration in comfort, pain Goals: Patient will verbalize adequate pain control and receive pain control interventions during procedures as needed Date Initiated: 11/19/2021 Date Inactivated: 04/05/2022 Target Resolution Date: 04/22/2022 Goal Status: Met Patient/caregiver  will verbalize comfort level met Date Initiated: 11/19/2021 Target Resolution Date: 05/20/2022 Goal Status: Active Interventions: Encourage patient to take pain medications as prescribed Provide education on pain management Reposition patient for comfort Treatment Activities: Administer pain control measures as ordered : 11/19/2021 Notes: Electronic Signature(s) Signed: 05/03/2022 5:16:45 PM By: Christine Pilling RN, BSN Entered By: [name] on 05/03/2022 11:26:58 -------------------------------------------------------------------------------- Pain Assessment Details Patient Name: Date of Service: Christine Debar [name] 05/03/2022 11:15 A M Medical Record Number: [id number] Patient Account Number: [id number] Date of Birth/Sex: Treating RN: [birth year] (71 y.o. F) Primary Care Clotiel Troop: [name] Other Clinician: Referring Clay Menser: Treating Dannah Ryles/Extender: [name] in Treatment: 23 Active Problems Location of Pain Severity and Description of Pain Patient Has Paino No Site Locations Pain Management and Medication Current Pain Management: Electronic Signature(s) Signed: 05/03/2022  4:33:24 PM By: [name] Entered By: [name] on 05/03/2022 11:10:47 -------------------------------------------------------------------------------- Patient/Caregiver Education Details Patient Name: Date of Service: [name] 9/12/2023andnbsp11:15 A M Medical Record Number: [id number] Patient Account Number: [id number] Date of Birth/Gender: Treating RN: [name][birth year] (71 y.o. [name] Primary Care Physician: [name] Other Clinician: Referring Physician: Treating Physician/Extender: [name] in Treatment: 23 Education Assessment Education Provided To: Patient Education Topics Provided Safety: Handouts: Medication Safety Methods: Explain/Verbal Responses: Reinforcements needed Metallurgist) Signed: 05/03/2022 5:16:45 PM By: Christine Pilling RN, BSN Entered By: [name] on 05/03/2022 11:27:12 -------------------------------------------------------------------------------- Wound Assessment Details Patient Name: Date of Service: Christine Simmonds J. 05/03/2022 11:15 A M Medical Record Number: [id number] Patient Account Number: [id number] Date of Birth/Sex: Treating RN: [birth year] [name] (71 y.o. F) Primary Care Kenniel Bergsma: [name] Other Clinician: Referring Acadia Thammavong: Treating Renarda Mullinix/Extender: [name] in Treatment: 23 Wound Status Wound Number: 1 Primary Etiology: Trauma, Other Wound Location: Left, Lateral Lower Leg Wound Status: Open Wounding Event: Trauma Comorbid History: Congestive Heart Failure, Hypertension Date Acquired: 10/18/2021 Weeks Of Treatment: 23 Clustered Wound: Yes Photos Wound Measurements Length: (cm) 0.3 Width: (cm) 0.4 Depth: (cm) 0.5 Clustered Quantity: 1 Area: (cm) 0.094 Volume: (cm) 0.047 % Reduction in Area: 100% % Reduction in Volume: 100% Epithelialization: Large (67-100%) Tunneling: Yes Position (o'clock): 11 Maximum Distance: (cm) 3.5 Undermining: No Wound Description Classification: Full Thickness Without Exposed Support Structures Wound Margin: Distinct, outline attached Exudate Amount: Medium Exudate Type: Serosanguineous Exudate Color: red, brown Foul Odor After Cleansing: No Slough/Fibrino No Wound Bed Granulation Amount: Large (67-100%) Exposed Structure Granulation Quality: Red Fascia Exposed: No Necrotic Amount: None Present (0%) Fat Layer (Subcutaneous Tissue) Exposed: Yes Tendon Exposed: No Muscle Exposed: No Joint Exposed: No Bone Exposed: No Treatment Notes Wound #1 (Lower Leg) Wound Laterality: Left, Lateral Cleanser Wound Cleanser Discharge Instruction: Cleanse the wound with wound cleanser prior to applying a clean dressing using gauze sponges, not tissue or  cotton balls. Peri-Wound Care Ketoconazole Cream 2% Discharge Instruction: Apply Ketoconazole or antifingal cream over the counter to periwound as needed for any skin irritation. Topical Primary Dressing Dakin's Solution 0.25%, 16 (oz) Discharge Instruction: Moisten 1/4 plain packing strips with Dakin's solution Secondary Dressing ABD Pad, 5x9 Discharge Instruction: Apply over primary dressing as directed. Woven Gauze Sponge, Non-Sterile 4x4 in Discharge Instruction: Apply over primary dressing as directed. Secured With [employer], 4.5x3.1 (in/yd) Discharge Instruction: Secure with Kerlix as directed. 6M Medipore H Soft Cloth Surgical T ape, 4 x 10 (in/yd) Discharge Instruction: Secure with tape as directed. Compression Wrap Compression Stockings Add-Ons Electronic Signature(s) Signed: 05/03/2022 4:33:24 PM By: [name] Entered By: [name] on 05/03/2022 11:20:54 -------------------------------------------------------------------------------- Vitals Details Patient Name: Date of Service: [name], Christine [name] 05/03/2022 11:15 A M Medical Record Number: [id number] Patient Account Number: [id number] Date of Birth/Sex: Treating RN: [name] (71 y.o. F) Primary Care Isabela Nardelli: [name] Other Clinician: Referring Gustin Zobrist: Treating Erisa Mehlman/Extender: [name] in Treatment: 23 Vital Signs Time Taken: 11:10 Temperature (F): 98 Height (in): 67 Pulse (bpm): 59 Weight (lbs): 340 Respiratory Rate (breaths/min): 18 Body Mass Index (BMI): 53.2 Blood Pressure (mmHg): 147/72 Reference Range: 80 - 120 mg / dl Electronic Signature(s) Signed: 05/03/2022 4:33:24 PM By: [name] Entered By: [name] on 05/03/2022 11:10:39

## 2022-05-03 NOTE — Progress Notes (Signed)
[name], [name] ([id number]) Visit Report for 05/03/2022 Chief Complaint Document Details Patient Name: Date of Service: [name] 05/03/2022 11:15 A M Medical Record Number: [id number] Patient Account Number: [id number] Date of Birth/Sex: Treating RN: 08-20-1951 (71 y.o. F) Primary Care Provider: [name] Other Clinician: Referring Provider: Treating Provider/Extender: Annamary Rummage in Treatment: 23 Information Obtained from: Patient Chief Complaint 11/19/2021; Left lower extremity wound status post fall Electronic Signature(s) Signed: 05/03/2022 11:34:21 AM By: Geralyn Corwin DO Entered By: Geralyn Corwin on 05/03/2022 11:29:58 -------------------------------------------------------------------------------- HPI Details Patient Name: Date of Service: [name], Christine [name] 05/03/2022 11:15 A M Medical Record Number: [id number] Patient Account Number: [id number] Date of Birth/Sex: Treating RN: 1951-03-24 (71 y.o. F) Primary Care Provider: [name] Other Clinician: Referring Provider: Treating Provider/Extender: Annamary Rummage in Treatment: 23 History of Present Illness HPI Description: Admission 11/19/2021 [name] is a 71 year old female with a past medical history of paroxysmal A-fib on Eliquis, hypothyroidism, major depressive disorder, venous insufficiency and chronic diastolic heart failure that presents to the clinic for a 1 month history of nonhealing wound to the left lower extremity. She visited the ED on 10/18/2021 after a mechanical fall. She developed a hematoma that subsequently opened. She was hospitalized for 7 days and discharged on 10/25/2021. She has been on several different antibiotics For the past month. She states that most recently she was on Levaquin and linezolid for the past week. She states she completes her antibiotic course tomorrow. She has been using Dakin's wet-to-dry  dressings to the wound bed. She denies signs of infection. 4/7; patient presents for follow-up. She has been using Dakin's wet-to-dry dressings. She did end up going to the ED on 4/1 because she had excess bleeding with dressing change that she could not stop. She is on Eliquis for A-fib. In the ED they tied off a small artery. She has had no issues since discharge. She denies signs of infection. 4/14; this is a very difficult clinical situation. A patient with underlying chronic venous insufficiency and lymphedema very significant lower extremity edema had a hematoma after a fall on her left upper lateral lower leg. She is on Eliquis for atrial fibrillation apparently with a history of a splenic infarct following with Dr. Berton Mount of cardiology. She has exhibited significant bleeding from the wound surface including ao Venous bleeder that required suturing short while ago. She has been using Dakin's wet-to-dry packing and over the surface of the wound. She saw Dr. Graciela Husbands yesterday he is reluctant to consider stopping the Eliquis because of the prior history of presumed cardioembolism. Wants to communicate with Dr. Mikey Bussing when she returns. In a perfect world where she was not on Eliquis she requires a wound VAC with additional compression wraps but I understand the reluctance to do this because of the concerns of bleeding 4/28; the patient's wound actually looks better today using Dakin's wet-to-dry that she is changing twice a day she is wrapping this with Kerlix and Ace wrapping. She tells me she had 2 small bleeding areas which were part of the superficial wound that stopped this week with direct pressure. Other than that no major issues. In follow-up from discussion of last week Dr. 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 The  bigger question from my  point of view is the wound VAC issue. As far as she knows and her daughter-in-law verifies that she has not had any bleeding from the deeper parts of the wound although the bleeding has been superficial including the one that sent her to the ER for stitches. She is concerned that a wound VAC would cause further bleeding and I cannot completely allay those concerns. 5/8; patient presents for follow-up. She has no issues or complaints today. She has been using Dakin's wet-to-dry dressings without issues. She denies signs of infection. 5/12; patient presents for follow-up. She continues to use Dakin's wet-to-dry dressings without issues. She denies signs of infection. She reports some issues with bleeding at times but this has improved. She denies signs of infection. 6/1; patient presents for follow-up. She was recently hospitalized for upper left leg thigh cellulitis. She was given IV cefepime and vancomycin and discharged on oral antibiotics. She has been using Dakin's wet-to-dry dressings to the left lower leg wound. She reports improvement in healing. She currently denies systemic signs of infection. 6/7; patient is using Dakin's wet-to-dry twice daily. In general this looks better than when I saw this a month or so ago however still considerable depth to the tunnel in her left leg. She is going for iron infusions ordered by her primary care doctor I believe 6/15; patient presents for follow-up. She has been using Dakin's wet-to-dry dressings. She has noted some scattered small areas that blister up and heal on her left lower leg. She has been using mupirocin ointment on them. Nothing open today. 6/23; patient's been using Dakin's wet-to-dry dressings to the tunneled wound and Hydrofera Blue to the opening. She has no issues or complaints today. 6/29; patient presents for follow-up. She has been using Dakin's wet-to-dry dressings to the tunneled wound and Hydrofera Blue to the  opening. [facility] wound [facility]. She denies signs of infection. 7/7; patient presents for follow-up. She has been using Dakin's wet-to-dry dressings to the tunneled wound and PolyMem silver to the opening without issues. 7/13; patient presents for follow-up. She has been using PolyMem silver to the opening and the rope to the tunnel. She has no issues or complaints today. She denies signs of infection. 7/20; patient presents for follow-up. We have been using PolyMem silver to the wound bed. She has no issues or complaints today. She is receiving her custom compression garments tomorrow. 7/27; patient presents for follow-up. She has been using PolyMem silver to the wound bed. She is having her custom compression garments adjusted as these are not staying on very easily. 8/30; patient presents for follow-up. We have been using PolyMem silver to the wound bed. She is still waiting on her custom compression garments to be ordered. She denies signs of infection. 8/11; patient presents for follow-up. The wound VAC has been started and patient has been using this for the past week. Patient has home health who changes the wound VAC. She developed irritation to the periwound. DuoDERM was not being used to the periwound despite being ordered. 8/15; patient presents for follow-up. She restarted the wound VAC and has had improvement to the periwound with the use of DuoDERM. 8/24; patient presents for follow-up. She has been using the wound VAC. She has developed a wound just superior to the original wound. Likely as a result from the wound VAC. She denies signs of infection. 8/29; patient presents for follow-up. We have been using collagen to the wound bed. She  has held off on the wound VAC for the past week. 9/7; patient presents for follow-up. We have been using iodoform packing to the wound bed. She has mild tenderness to the periwound. She denies systemic signs of  infection. 9/12; patient presents for follow-up. Patient has been using Dakin's wet-to-dry dressings. She has no issues or complaints today. She denies signs of infection. Electronic Signature(s) Signed: 05/03/2022 11:34:21 AM By: Geralyn Corwin DO Entered By: Geralyn Corwin on 05/03/2022 11:30:53 -------------------------------------------------------------------------------- Physical Exam Details Patient Name: Date of Service: Marthenia Rolling [name] 05/03/2022 11:15 A M Medical Record Number: [id number] Patient Account Number: [id number] Date of Birth/Sex: Treating RN: Oct 15, 1950 (71 y.o. F) Primary Care Provider: [name] Other Clinician: Referring Provider: Treating Provider/Extender: Annamary Rummage in Treatment: 23 Constitutional respirations regular, non-labored and within target range for patient.. Cardiovascular 2+ dorsalis pedis/posterior tibialis pulses. Psychiatric pleasant and cooperative. Notes Left lower extremity: Open wound with granulation tissue at the opening that has depth of about 3 cm. No increased warmth, erythema or purulent drainage.No tenderness on palpation. Electronic Signature(s) Signed: 05/03/2022 11:34:21 AM By: Geralyn Corwin DO Entered By: Geralyn Corwin on 05/03/2022 11:31:37 -------------------------------------------------------------------------------- Physician Orders Details Patient Name: Date of Service: Marthenia Rolling [name] 05/03/2022 11:15 A M Medical Record Number: [id number] Patient Account Number: [id number] Date of Birth/Sex: Treating RN: Mar 31, 1951 (71 y.o. [name] Primary Care Provider: [name] Other Clinician: Referring Provider: Treating Provider/Extender: Annamary Rummage in Treatment: 31 Verbal / Phone Orders: No Diagnosis Coding ICD-10 Coding Code Description [phone] Non-pressure chronic ulcer of other part of left lower leg with fat layer  exposed T79.8XXA Other early complications of trauma, initial encounter I87.312 Chronic venous hypertension (idiopathic) with ulcer of left lower extremity I89.0 Lymphedema, not elsewhere classified I48.0 Paroxysmal atrial fibrillation Z79.01 Long term (current) use of anticoagulants Follow-up Appointments ppointment in 1 week. - Dr. Mikey Bussing and Yvonne Kendall, Room 8 Return A ppointment in 2 weeks. - Dr. Mikey Bussing and [place], Room 8 Return A Anesthetic (In clinic) Topical Lidocaine 5% applied to wound bed Cellular or Tissue Based Products Wound #1 Left,Lateral Lower Leg Cellular or Tissue Based Product Type: - Puraply AM will be ordered and placed at next appt time. Bathing/ Shower/ Hygiene May shower with protection but do not get wound dressing(s) wet. Negative Presssure Wound Therapy Discontinue wound vac - patient to call the number on the machine for pick up. Edema Control - Lymphedema / SCD / Other Elevate legs to the level of the heart or above for 30 minutes daily and/or when sitting, a frequency of: - 3-4 times a day throughout the day. Avoid standing for long periods of time. Moisturize legs daily. - both legs every night before bed. Home Health No change in wound care orders this week; continue Home Health for wound care. May utilize formulary equivalent dressing for wound treatment orders unless otherwise specified. - Plain packing strips with Dakin's Solution lgihtly pack daily to left leg. Other Home Health Orders/Instructions: - [facility] Wound Treatment Wound #1 - Lower Leg Wound Laterality: Left, Lateral Cleanser: Wound Cleanser (Home Health) Every Other Day/30 Days Discharge Instructions: Cleanse the wound with wound cleanser prior to applying a clean dressing using gauze sponges, not tissue or cotton balls. Peri-Wound Care: Ketoconazole Cream 2% Every Other Day/30 Days Discharge Instructions: Apply Ketoconazole or antifingal cream over the counter to periwound as needed  for any skin irritation. Prim Dressing: Dakin's Solution 0.25%, 16 (oz) Every Other Day/30 Days  ary Discharge Instructions: Moisten 1/4 plain packing strips with Dakin's solution Secondary Dressing: ABD Pad, 5x9 Every Other Day/30 Days Discharge Instructions: Apply over primary dressing as directed. Secondary Dressing: Woven Gauze Sponge, Non-Sterile 4x4 in [facility]) Every Other Day/30 Days Discharge Instructions: Apply over primary dressing as directed. Secured With: [employer], 4.5x3.1 (in/yd) Every Other Day/30 Days Discharge Instructions: Secure with Kerlix as directed. Secured With: 42M Medipore H Soft Cloth Surgical T ape, 4 x 10 (in/yd) Every Other Day/30 Days Discharge Instructions: Secure with tape as directed. Electronic Signature(s) Signed: 05/03/2022 11:34:21 AM By: Geralyn Corwin DO Entered By: Geralyn Corwin on 05/03/2022 11:31:46 -------------------------------------------------------------------------------- Problem List Details Patient Name: Date of Service: Marthenia Rolling [name] 05/03/2022 11:15 A M Medical Record Number: [id number] Patient Account Number: [id number] Date of Birth/Sex: Treating RN: 08-03-1951 (71 y.o. [name] Primary Care Provider: [name] Other Clinician: Referring Provider: Treating Provider/Extender: Annamary Rummage in Treatment: 23 Active Problems ICD-10 Encounter Code Description Active Date MDM Diagnosis [phone] Non-pressure chronic ulcer of other part of left lower leg with fat layer exposed3/31/2023 No Yes T79.8XXA Other early complications of trauma, initial encounter 11/19/2021 No Yes I87.312 Chronic venous hypertension (idiopathic) with ulcer of left lower extremity 04/28/2022 No Yes I89.0 Lymphedema, not elsewhere classified 04/28/2022 No Yes I48.0 Paroxysmal atrial fibrillation 11/19/2021 No Yes Z79.01 Long term (current) use of anticoagulants 11/19/2021 No Yes Inactive Problems Resolved  Problems Electronic Signature(s) Signed: 05/03/2022 11:34:21 AM By: Geralyn Corwin DO Entered By: Geralyn Corwin on 05/03/2022 11:29:47 -------------------------------------------------------------------------------- Progress Note Details Patient Name: Date of Service: Marthenia Rolling [name] 05/03/2022 11:15 A M Medical Record Number: [id number] Patient Account Number: [id number] Date of Birth/Sex: Treating RN: [birth year] (71 y.o. F) Primary Care Provider: [name] Other Clinician: Referring Provider: Treating Provider/Extender: Annamary Rummage in Treatment: 23 Subjective Chief Complaint Information obtained from Patient 11/19/2021; Left lower extremity wound status post fall History of Present Illness (HPI) Admission 11/19/2021 [name] is a 71 year old female with a past medical history of paroxysmal A-fib on Eliquis, hypothyroidism, major depressive disorder, venous insufficiency and chronic diastolic heart failure that presents to the clinic for a 1 month history of nonhealing wound to the left lower extremity. She visited the ED on 10/18/2021 after a mechanical fall. She developed a hematoma that subsequently opened. She was hospitalized for 7 days and discharged on 10/25/2021. She has been on several different antibiotics For the past month. She states that most recently she was on Levaquin and linezolid for the past week. She states she completes her antibiotic course tomorrow. She has been using Dakin's wet-to-dry dressings to the wound bed. She denies signs of infection. 4/7; patient presents for follow-up. She has been using Dakin's wet-to-dry dressings. She did end up going to the ED on 4/1 because she had excess bleeding with dressing change that she could not stop. She is on Eliquis for A-fib. In the ED they tied off a small artery. She has had no issues since discharge. She denies signs of infection. 4/14; this is a very difficult clinical  situation. A patient with underlying chronic venous insufficiency and lymphedema very significant lower extremity edema had a hematoma after a fall on her left upper lateral lower leg. She is on Eliquis for atrial fibrillation apparently with a history of a splenic infarct following with Dr. Berton Mount of cardiology. She has exhibited significant bleeding from the wound surface including ao Venous bleeder that required suturing  short while ago. She has been using Dakin's wet-to-dry packing and over the surface of the wound. She saw Dr. Graciela Husbands yesterday he is reluctant to consider stopping the Eliquis because of the prior history of presumed cardioembolism. Wants to communicate with Dr. Mikey Bussing when she returns. In a perfect world where she was not on Eliquis she requires a wound VAC with additional compression wraps but I understand the reluctance to do this because of the concerns of bleeding 4/28; the patient's wound actually looks better today using Dakin's wet-to-dry that she is changing twice a day she is wrapping this with Kerlix and Ace wrapping. She tells me she had 2 small bleeding areas which were part of the superficial wound that stopped this week with direct pressure. Other than that no major issues. In follow-up from discussion of last week Dr. 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 The bigger question from my point of view is the wound VAC issue. As far as she knows and her daughter-in-law verifies that she has not had any bleeding from the deeper parts of the wound although the bleeding has been superficial including the one that sent her to the ER for stitches. She is concerned that a wound VAC would cause further bleeding and I cannot completely allay those concerns. 5/8; patient presents for follow-up. She has no  issues or complaints today. She has been using Dakin's wet-to-dry dressings without issues. She denies signs of infection. 5/12; patient presents for follow-up. She continues to use Dakin's wet-to-dry dressings without issues. She denies signs of infection. She reports some issues with bleeding at times but this has improved. She denies signs of infection. 6/1; patient presents for follow-up. She was recently hospitalized for upper left leg thigh cellulitis. She was given IV cefepime and vancomycin and discharged on oral antibiotics. She has been using Dakin's wet-to-dry dressings to the left lower leg wound. She reports improvement in healing. She currently denies systemic signs of infection. 6/7; patient is using Dakin's wet-to-dry twice daily. In general this looks better than when I saw this a month or so ago however still considerable depth to the tunnel in her left leg. She is going for iron infusions ordered by her primary care doctor I believe 6/15; patient presents for follow-up. She has been using Dakin's wet-to-dry dressings. She has noted some scattered small areas that blister up and heal on her left lower leg. She has been using mupirocin ointment on them. Nothing open today. 6/23; patient's been using Dakin's wet-to-dry dressings to the tunneled wound and Hydrofera Blue to the opening. She has no issues or complaints today. 6/29; patient presents for follow-up. She has been using Dakin's wet-to-dry dressings to the tunneled wound and Hydrofera Blue to the opening. She states that [facility] is sticking to the wound bed. She denies signs of infection. 7/7; patient presents for follow-up. She has been using Dakin's wet-to-dry dressings to the tunneled wound and PolyMem silver to the opening without issues. 7/13; patient presents for follow-up. She has been using PolyMem silver to the opening and the rope to the tunnel. She has no issues or complaints today. She denies signs of  infection. 7/20; patient presents for follow-up. We have been using PolyMem silver to the wound bed. She has no issues or complaints today. She is receiving her  custom compression garments tomorrow. 7/27; patient presents for follow-up. She has been using PolyMem silver to the wound bed. She is having her custom compression garments adjusted as these are not staying on very easily. 8/30; patient presents for follow-up. We have been using PolyMem silver to the wound bed. She is still waiting on her custom compression garments to be ordered. She denies signs of infection. 8/11; patient presents for follow-up. The wound VAC has been started and patient has been using this for the past week. Patient has home health who changes the wound VAC. She developed irritation to the periwound. DuoDERM was not being used to the periwound despite being ordered. 8/15; patient presents for follow-up. She restarted the wound VAC and has had improvement to the periwound with the use of DuoDERM. 8/24; patient presents for follow-up. She has been using the wound VAC. She has developed a wound just superior to the original wound. Likely as a result from the wound VAC. She denies signs of infection. 8/29; patient presents for follow-up. We have been using collagen to the wound bed. She has held off on the wound VAC for the past week. 9/7; patient presents for follow-up. We have been using iodoform packing to the wound bed. She has mild tenderness to the periwound. She denies systemic signs of infection. 9/12; patient presents for follow-up. Patient has been using Dakin's wet-to-dry dressings. She has no issues or complaints today. She denies signs of infection. Patient History Medical History Cardiovascular Patient has history of Congestive Heart Failure, Hypertension Hospitalization/Surgery History - Cellulitis left leg- 01/03/2022-01/07/2022. Medical A Surgical History Notes nd Constitutional Symptoms (General  Health) Infarction of spleen Hematologic/Lymphatic Hypothyroidism Cardiovascular A-Fib Gastrointestinal Gastroesophageal reflux Genitourinary Chronic kidney disease Musculoskeletal Osteoarthritis of knee Psychiatric Anxiety Objective Constitutional respirations regular, non-labored and within target range for patient.. Vitals Time Taken: 11:10 AM, Height: 67 in, Weight: 340 lbs, BMI: 53.2, Temperature: 98 F, Pulse: 59 bpm, Respiratory Rate: 18 breaths/min, Blood Pressure: 147/72 mmHg. Cardiovascular 2+ dorsalis pedis/posterior tibialis pulses. Psychiatric pleasant and cooperative. General Notes: Left lower extremity: Open wound with granulation tissue at the opening that has depth of about 3 cm. No increased warmth, erythema or purulent drainage.No tenderness on palpation. Integumentary (Hair, Skin) Wound #1 status is Open. Original cause of wound was Trauma. The date acquired was: 10/18/2021. The wound has been in treatment 23 weeks. The wound is located on the Left,Lateral Lower Leg. The wound measures 0.3cm length x 0.4cm width x 0.5cm depth; 0.094cm^2 area and 0.047cm^3 volume. There is Fat Layer (Subcutaneous Tissue) exposed. There is no undermining noted, however, there is tunneling at 11:00 with a maximum distance of 3.5cm. There is a medium amount of serosanguineous drainage noted. The wound margin is distinct with the outline attached to the wound base. There is large (67-100%) red granulation within the wound bed. There is no necrotic tissue within the wound bed. Assessment Active Problems ICD-10 Non-pressure chronic ulcer of other part of left lower leg with fat layer exposed Other early complications of trauma, initial encounter Chronic venous hypertension (idiopathic) with ulcer of left lower extremity Lymphedema, not elsewhere classified Paroxysmal atrial fibrillation Long term (current) use of anticoagulants Patient's wound is stable. She has been approved for  PuraPly and is agreeable in having this placed next week. For now recommended Dakin's wet-to-dry dressings. Follow-up in 1 week. Plan Follow-up Appointments: Return Appointment in 1 week. - Dr. Mikey Bussing and Yvonne Kendall, Room 8 Return Appointment in 2 weeks. - Dr. Mikey Bussing and Yvonne Kendall, Room  8 Anesthetic: (In clinic) Topical Lidocaine 5% applied to wound bed Cellular or Tissue Based Products: Wound #1 Left,Lateral Lower Leg: Cellular or Tissue Based Product Type: - Puraply AM will be ordered and placed at next appt time. Bathing/ Shower/ Hygiene: May shower with protection but do not get wound dressing(s) wet. Negative Presssure Wound Therapy: Discontinue wound vac - patient to call the number on the machine for pick up. Edema Control - Lymphedema / SCD / Other: Elevate legs to the level of the heart or above for 30 minutes daily and/or when sitting, a frequency of: - 3-4 times a day throughout the day. Avoid standing for long periods of time. Moisturize legs daily. - both legs every night before bed. Home Health: No change in wound care orders this week; continue Home Health for wound care. May utilize formulary equivalent dressing for wound treatment orders unless otherwise specified. - Plain packing strips with Dakin's Solution lgihtly pack daily to left leg. Other Home Health Orders/Instructions: - [facility] WOUND #1: - Lower Leg Wound Laterality: Left, Lateral Cleanser: Wound Cleanser (Home Health) Every Other Day/30 Days Discharge Instructions: Cleanse the wound with wound cleanser prior to applying a clean dressing using gauze sponges, not tissue or cotton balls. Peri-Wound Care: Ketoconazole Cream 2% Every Other Day/30 Days Discharge Instructions: Apply Ketoconazole or antifingal cream over the counter to periwound as needed for any skin irritation. Prim Dressing: Dakin's Solution 0.25%, 16 (oz) Every Other Day/30 Days ary Discharge Instructions: Moisten 1/4 plain packing strips with  Dakin's solution Secondary Dressing: ABD Pad, 5x9 Every Other Day/30 Days Discharge Instructions: Apply over primary dressing as directed. Secondary Dressing: Woven Gauze Sponge, Non-Sterile 4x4 in [facility]) Every Other Day/30 Days Discharge Instructions: Apply over primary dressing as directed. Secured With: [employer], 4.5x3.1 (in/yd) Every Other Day/30 Days Discharge Instructions: Secure with Kerlix as directed. Secured With: 5M Medipore H Soft Cloth Surgical T ape, 4 x 10 (in/yd) Every Other Day/30 Days Discharge Instructions: Secure with tape as directed. 1. Dakin's wet-to-dry dressing 2. Follow-up in 1 week Electronic Signature(s) Signed: 05/03/2022 11:34:21 AM By: Geralyn Corwin DO Entered By: Geralyn Corwin on 05/03/2022 11:32:44 -------------------------------------------------------------------------------- HxROS Details Patient Name: Date of Service: [name], Christine [name] 05/03/2022 11:15 A M Medical Record Number: [id number] Patient Account Number: [id number] Date of Birth/Sex: Treating RN: 1951-03-27 (71 y.o. F) Primary Care Provider: [name] Other Clinician: Referring Provider: Treating Provider/Extender: Annamary Rummage in Treatment: 23 Constitutional Symptoms (General Health) Medical History: Past Medical History Notes: Infarction of spleen Hematologic/Lymphatic Medical History: Past Medical History Notes: Hypothyroidism Cardiovascular Medical History: Positive for: Congestive Heart Failure; Hypertension Past Medical History Notes: A-Fib Gastrointestinal Medical History: Past Medical History Notes: Gastroesophageal reflux Genitourinary Medical History: Past Medical History Notes: Chronic kidney disease Musculoskeletal Medical History: Past Medical History Notes: Osteoarthritis of knee Psychiatric Medical History: Past Medical History Notes: Anxiety Immunizations Pneumococcal Vaccine: Received Pneumococcal  Vaccination: No Implantable Devices No devices added Hospitalization / Surgery History Type of Hospitalization/Surgery Cellulitis left leg- 01/03/2022-01/07/2022 Electronic Signature(s) Signed: 05/03/2022 11:34:21 AM By: Geralyn Corwin DO Entered By: Geralyn Corwin on 05/03/2022 11:30:58 -------------------------------------------------------------------------------- SuperBill Details Patient Name: Date of Service: Marthenia Rolling [name] 05/03/2022 Medical Record Number: [id number] Patient Account Number: [id number] Date of Birth/Sex: Treating RN: 09-04-1950 (71 y.o. 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 I89.0 Lymphedema, not elsewhere classified I48.0 Paroxysmal atrial fibrillation Z79.01 Long term (current) use of anticoagulants Facility Procedures CPT4 Code: 95093267 Description: 99213 - WOUND CARE VISIT-LEV 3 EST PT Modifier: Quantity: 1 Physician Procedures : CPT4 Code Description Modifier [id number] 99213 - WC PHYS LEVEL 3 - EST PT ICD-10 Diagnosis Description L97.822 Non-pressure chronic ulcer of other part of left lower leg with fat layer exposed T79.8XXA Other early complications of trauma, initial  encounter I87.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

## 2022-05-05 ENCOUNTER — Encounter (HOSPITAL_BASED_OUTPATIENT_CLINIC_OR_DEPARTMENT_OTHER): Payer: PPO | Admitting: Internal Medicine

## 2022-05-12 ENCOUNTER — Encounter (HOSPITAL_BASED_OUTPATIENT_CLINIC_OR_DEPARTMENT_OTHER): Payer: PPO | Admitting: Internal Medicine

## 2022-05-12 DIAGNOSIS — L97822 Non-pressure chronic ulcer of other part of left lower leg with fat layer exposed: Secondary | ICD-10-CM | POA: Diagnosis not present

## 2022-05-12 NOTE — Progress Notes (Addendum)
ODELLE, KOSIER ([id number]) Visit Report for 05/12/2022 Arrival Information Details Patient Name: Date of Service: [name] 05/12/2022 11:15 A M Medical Record Number: [id number] Patient Account Number: [id number] Date of Birth/Sex: Treating RN: Aug 14, 1951 (71 y.o. [name], Tammi Klippel Primary Care Jess Toney: Deland Pretty Other Clinician: Referring Rollande Thursby: Treating Jonathin Heinicke/Extender: Judie Grieve in Treatment: 24 Visit Information History Since Last Visit Added or deleted any medications: No Patient Arrived: [name] Any new allergies or adverse reactions: No Arrival Time: 11:24 Had a fall or experienced change in No Accompanied By: self activities of daily living that may affect Transfer Assistance: None risk of falls: Patient Identification Verified: Yes Signs or symptoms of abuse/neglect since last visito No Secondary Verification Process Completed: Yes Hospitalized since last visit: No Patient Requires Transmission-Based Precautions: No Implantable device outside of the clinic excluding No Patient Has Alerts: Yes cellular tissue based products placed in the center Patient Alerts: Patient on Blood Thinner since last visit: Has Dressing in Place as Prescribed: Yes Pain Present Now: No Electronic Signature(s) Signed: 05/12/2022 5:58:31 PM By: Deon Pilling RN, BSN Entered By: Deon Pilling on 05/12/2022 11:24:25 -------------------------------------------------------------------------------- Lower Extremity Assessment Details Patient Name: Date of Service: Christine Simmonds J. 05/12/2022 11:15 A M Medical Record Number: [id number] Patient Account Number: [id number] Date of Birth/Sex: Treating RN: [birth year] (71 y.o. [name] Primary Care Amenda Duclos: Deland Pretty Other Clinician: Referring Tiari Andringa: Treating Irby Fails/Extender: Judie Grieve in Treatment: 24 Edema Assessment Assessed: Shirlyn Goltz: Yes] [name]:  No] Edema: [Left: Ye] [Right: s] Calf Left: Right: Point of Measurement: 31 cm From Medial Instep 41 cm Ankle Left: Right: Point of Measurement: 9 cm From Medial Instep 26 cm Vascular Assessment Pulses: Dorsalis Pedis Palpable: [Left:Yes] Electronic Signature(s) Signed: 05/12/2022 5:58:31 PM By: Deon Pilling RN, BSN Entered By: Deon Pilling on 05/12/2022 11:27:34 -------------------------------------------------------------------------------- Multi Wound Chart Details Patient Name: Date of Service: Christine Simmonds J. 05/12/2022 11:15 A M Medical Record Number: [id number] Patient Account Number: [id number] Date of Birth/Sex: Treating RN: May 01, 1951 (71 y.o. [name] Primary Care Vedder Brittian: Deland Pretty Other Clinician: Referring Syriah Delisi: Treating Edana Aguado/Extender: Judie Grieve in Treatment: 24 Vital Signs Height(in): 67 Pulse(bpm): 51 Weight(lbs): 340 Blood Pressure(mmHg): 153/80 Body Mass Index(BMI): 53.2 Temperature(F): 98.8 Respiratory Rate(breaths/min): 20 Photos: [N/A:N/A] Left, Lateral Lower Leg N/A N/A Wound Location: Trauma N/A N/A Wounding Event: Trauma, Other N/A N/A Primary Etiology: Congestive Heart Failure, N/A N/A Comorbid History: Hypertension 10/18/2021 N/A N/A Date Acquired: 24 N/A N/A Weeks of Treatment: Open N/A N/A Wound Status: No N/A N/A Wound Recurrence: Yes N/A N/A Clustered Wound: 1 N/A N/A Clustered Quantity: 0.4x0.6x0.5 N/A N/A Measurements L x W x D (cm) 0.188 N/A N/A A (cm) : rea 0.094 N/A N/A Volume (cm) : 99.90% N/A N/A % Reduction in A rea: 100.00% N/A N/A % Reduction in Volume: 11 Position 1 (o'clock): 3.8 Maximum Distance 1 (cm): Yes N/A N/A Tunneling: Full Thickness Without Exposed N/A N/A Classification: Support Structures Medium N/A N/A Exudate Amount: Serosanguineous N/A N/A Exudate Type: red, brown N/A N/A Exudate Color: Distinct, outline attached N/A  N/A Wound Margin: Large (67-100%) N/A N/A Granulation Amount: Red N/A N/A Granulation Quality: None Present (0%) N/A N/A Necrotic Amount: Fat Layer (Subcutaneous Tissue): Yes N/A N/A Exposed Structures: Fascia: No Tendon: No Muscle: No Joint: No Bone: No Large (67-100%) N/A N/A Epithelialization: Cellular or Tissue Based Product N/A N/A Procedures Performed: Treatment Notes Electronic Signature(s) Signed: 05/13/2022 10:28:42 AM By:  Kalman Shan DO Signed: 05/13/2022 2:51:11 PM By: Deon Pilling RN, BSN Entered By: Kalman Shan on 05/13/2022 09:22:16 -------------------------------------------------------------------------------- Multi-Disciplinary Care Plan Details Patient Name: Date of Service: Christine Simmonds J. 05/12/2022 11:15 A M Medical Record Number: [id number] Patient Account Number: [id number] Date of Birth/Sex: Treating RN: Jul 21, 1951 (71 y.o. [name], [name] Primary Care Sunset Joshi: Deland Pretty Other Clinician: Referring Abdishakur Gottschall: Treating Seger Jani/Extender: Judie Grieve in Treatment: 24 Active Inactive Abuse / Safety / Falls / Self Care Management Nursing Diagnoses: History of Falls Potential for falls Goals: Patient/caregiver will verbalize/demonstrate measure taken to improve self care Date Initiated: 11/19/2021 Date Inactivated: 04/05/2022 Target Resolution Date: 04/21/2022 Goal Status: Met Patient/caregiver will verbalize/demonstrate measures taken to prevent injury and/or falls Date Initiated: 11/19/2021 Target Resolution Date: 06/17/2022 Goal Status: Active Interventions: Provide education on basic hygiene Provide education on fall prevention Provide education on HBO safety Provide education on vaccinations Notes: Pain, Acute or Chronic Nursing Diagnoses: Pain, acute or chronic: actual or potential Potential alteration in comfort, pain Goals: Patient will verbalize adequate pain control and receive pain  control interventions during procedures as needed Date Initiated: 11/19/2021 Date Inactivated: 04/05/2022 Target Resolution Date: 04/22/2022 Goal Status: Met Patient/caregiver will verbalize comfort level met Date Initiated: 11/19/2021 Target Resolution Date: 06/17/2022 Goal Status: Active Interventions: Encourage patient to take pain medications as prescribed Provide education on pain management Reposition patient for comfort Treatment Activities: Administer pain control measures as ordered : 11/19/2021 Notes: Electronic Signature(s) Signed: 05/12/2022 5:58:31 PM By: Deon Pilling RN, BSN Entered By: Deon Pilling on 05/12/2022 11:39:47 -------------------------------------------------------------------------------- Pain Assessment Details Patient Name: Date of Service: Christine Simmonds J. 05/12/2022 11:15 A M Medical Record Number: [id number] Patient Account Number: [id number] Date of Birth/Sex: Treating RN: 1951/03/12 (71 y.o. [name] Primary Care Arnol Mcgibbon: Deland Pretty Other Clinician: Referring Avrie Kedzierski: Treating Mahum Betten/Extender: Judie Grieve in Treatment: 24 Active Problems Location of Pain Severity and Description of Pain Patient Has Paino No Site Locations Pain Management and Medication Current Pain Management: Electronic Signature(s) Signed: 05/12/2022 5:58:31 PM By: Deon Pilling RN, BSN Entered By: Deon Pilling on 05/12/2022 11:29:45 -------------------------------------------------------------------------------- Patient/Caregiver Education Details Patient Name: Date of Service: [name] 9/21/2023andnbsp11:15 A M Medical Record Number: [id number] Patient Account Number: [id number] Date of Birth/Gender: Treating RN: 1950-09-19 (71 y.o. [name] Primary Care Physician: Deland Pretty Other Clinician: Referring Physician: Treating Physician/Extender: Judie Grieve in Treatment:  24 Education Assessment Education Provided To: Patient Education Topics Provided Wound/Skin Impairment: Handouts: Skin Care Do's and Dont's Methods: Explain/Verbal Responses: Reinforcements needed Electronic Signature(s) Signed: 05/12/2022 5:58:31 PM By: Deon Pilling RN, BSN Entered By: Deon Pilling on 05/12/2022 11:39:57 -------------------------------------------------------------------------------- Wound Assessment Details Patient Name: Date of Service: Christine Simmonds J. 05/12/2022 11:15 A M Medical Record Number: [id number] Patient Account Number: [id number] Date of Birth/Sex: Treating RN: 07/14/1951 (71 y.o. [name], [name] Primary Care Duvan Mousel: Deland Pretty Other Clinician: Referring Jeanette Rauth: Treating Ilene Witcher/Extender: Judie Grieve in Treatment: 24 Wound Status Wound Number: 1 Primary Etiology: Trauma, Other Wound Location: Left, Lateral Lower Leg Wound Status: Open Wounding Event: Trauma Comorbid History: Congestive Heart Failure, Hypertension Date Acquired: 10/18/2021 Weeks Of Treatment: 24 Clustered Wound: Yes Photos Wound Measurements Length: (cm) 0.4 Width: (cm) 0.6 Depth: (cm) 0.5 Clustered Quantity: 1 Area: (cm) 0.188 Volume: (cm) 0.094 % Reduction in Area: 99.9% % Reduction in Volume: 100% Epithelialization: Large (67-100%) Tunneling: Yes Position (o'clock): 11 Maximum Distance: (cm) 3.8 Undermining:  No Wound Description Classification: Full Thickness Without Exposed Support Structures Wound Margin: Distinct, outline attached Exudate Amount: Medium Exudate Type: Serosanguineous Exudate Color: red, brown Foul Odor After Cleansing: No Slough/Fibrino No Wound Bed Granulation Amount: Large (67-100%) Exposed Structure Granulation Quality: Red Fascia Exposed: No Necrotic Amount: None Present (0%) Fat Layer (Subcutaneous Tissue) Exposed: Yes Tendon Exposed: No Muscle Exposed: No Joint Exposed: No Bone Exposed:  No Electronic Signature(s) Signed: 05/12/2022 5:58:31 PM By: Deon Pilling RN, BSN Entered By: Deon Pilling on 05/12/2022 11:29:04 -------------------------------------------------------------------------------- Vitals Details Patient Name: Date of Service: Christine Debar [name] 05/12/2022 11:15 A M Medical Record Number: [id number] Patient Account Number: [id number] Date of Birth/Sex: Treating RN: [birth year] (71 y.o. [name], [name] Primary Care Jeweldean Drohan: Deland Pretty Other Clinician: Referring Wali Reinheimer: Treating Ekaterina Denise/Extender: Judie Grieve in Treatment: 24 Vital Signs Time Taken: 11:25 Temperature (F): 98.8 Height (in): 67 Pulse (bpm): 51 Weight (lbs): 340 Respiratory Rate (breaths/min): 20 Body Mass Index (BMI): 53.2 Blood Pressure (mmHg): 153/80 Reference Range: 80 - 120 mg / dl Electronic Signature(s) Signed: 05/12/2022 5:58:31 PM By: Deon Pilling RN, BSN Entered By: Deon Pilling on 05/12/2022 11:30:02

## 2022-05-13 NOTE — Progress Notes (Addendum)
SANDE, PICKERT ([id number]) Visit Report for 05/12/2022 Chief Complaint Document Details Patient Name: Date of Service: [name] 05/12/2022 11:15 A M Medical Record Number: [id number] Patient Account Number: [id number] Date of Birth/Sex: Treating RN: May 31, 1951 (71 y.o. [name] Primary Care Provider: [name] Other Clinician: Referring Provider: Treating Provider/Extender: Annamary Rummage in Treatment: 24 Information Obtained from: Patient Chief Complaint 11/19/2021; Left lower extremity wound status post fall Electronic Signature(s) Signed: 05/13/2022 10:28:42 AM By: Geralyn Corwin DO Entered By: Geralyn Corwin on 05/13/2022 09:43:21 -------------------------------------------------------------------------------- Cellular or Tissue Based Product Details Patient Name: Date of Service: Christine Rolling [name] 05/12/2022 11:15 A M Medical Record Number: [id number] Patient Account Number: [id number] Date of Birth/Sex: Treating RN: October 07, 1950 (71 y.o. [name] Primary Care Provider: [name] Other Clinician: Referring Provider: Treating Provider/Extender: Annamary Rummage in Treatment: 24 Cellular or Tissue Based Product Type Wound #1 Left,Lateral Lower Leg Applied to: Performed By: Physician Geralyn Corwin, DO Cellular or Tissue Based Product Type: Puraply AM Level of Consciousness (Pre-procedure): Awake and Alert Pre-procedure Verification/Time Out Yes - 11:45 Taken: Location: trunk / arms / legs Wound Size (sq cm): 0.24 Product Size (sq cm): 12 Waste Size (sq cm): 0 Amount of Product Applied (sq cm): 12 Instrument Used: Forceps, Scissors Lot #: [id number] Order #: 1 Expiration Date: 07/12/2024 Fenestrated: Christine Reconstituted: Christine Secured: Yes Secured With: Steri-Strips, adaptic Dressing Applied: Christine Procedural Pain: 0 Post Procedural Pain: 0 Response to Treatment: Procedure was  tolerated well Level of Consciousness (Post- Awake and Alert procedure): Post Procedure Diagnosis Same as Pre-procedure Electronic Signature(s) Signed: 05/12/2022 5:58:31 PM By: Shawn Stall RN, BSN Signed: 05/13/2022 9:20:47 AM By: Geralyn Corwin DO Entered By: Shawn Stall on 05/12/2022 11:50:41 -------------------------------------------------------------------------------- HPI Details Patient Name: Date of Service: Christine Rolling [name] 05/12/2022 11:15 A M Medical Record Number: [id number] Patient Account Number: [id number] Date of Birth/Sex: Treating RN: Jun 03, 1951 (71 y.o. [name] Primary Care Provider: [name] Other Clinician: Referring Provider: Treating Provider/Extender: Annamary Rummage in Treatment: 24 History of Present Illness HPI Description: Admission 11/19/2021 Ms. [name] Benavides is a 71 year old female with a past medical history of paroxysmal A-fib on Eliquis, hypothyroidism, major depressive disorder, venous insufficiency and chronic diastolic heart failure that presents to the clinic for a 1 month history of nonhealing wound to the left lower extremity. She visited the ED on 10/18/2021 after a mechanical fall. She developed a hematoma that subsequently opened. She was hospitalized for 7 days and discharged on 10/25/2021. She has been on several different antibiotics For the past month. She states that most recently she was on Levaquin and linezolid for the past week. She states she completes her antibiotic course tomorrow. She has been using Dakin's wet-to-dry dressings to the wound bed. She denies signs of infection. 4/7; patient presents for follow-up. She has been using Dakin's wet-to-dry dressings. She did end up going to the ED on 4/1 because she had excess bleeding with dressing change that she could not stop. She is on Eliquis for A-fib. In the ED they tied off a small artery. She has had Christine issues since discharge. She denies  signs of infection. 4/14; this is a very difficult clinical situation. A patient with underlying chronic venous insufficiency and lymphedema very significant lower extremity edema had a hematoma after a fall on her left upper lateral lower leg. She is on Eliquis for atrial fibrillation apparently with a history of  a splenic infarct following with Dr. Berton Mount of cardiology. She has exhibited significant bleeding from the wound surface including ao Venous bleeder that required suturing short while ago. She has been using Dakin's wet-to-dry packing and over the surface of the wound. She saw Dr. Graciela Husbands yesterday he is reluctant to consider stopping the Eliquis because of the prior history of presumed cardioembolism. Wants to communicate with Dr. Mikey Bussing when she returns. In a perfect world where she was not on Eliquis she requires a wound VAC with additional compression wraps but I understand the reluctance to do this because of the concerns of bleeding 4/28; the patient's wound actually looks better today using Dakin's wet-to-dry that she is changing twice a day she is wrapping this with Kerlix and Ace wrapping. She tells me she had 2 small bleeding areas which were part of the superficial wound that stopped this week with direct pressure. Other than that Christine major issues. In follow-up from discussion of last week Dr. 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 The bigger question from my point of view is the wound VAC issue. As far as she knows and her daughter-in-law verifies that she has not had any bleeding from the deeper parts of the wound although the bleeding has been superficial including the one that sent her to the ER for stitches. She is concerned that a wound VAC would cause further bleeding and I cannot completely allay those  concerns. 5/8; patient presents for follow-up. She has Christine issues or complaints today. She has been using Dakin's wet-to-dry dressings without issues. She denies signs of infection. 5/12; patient presents for follow-up. She continues to use Dakin's wet-to-dry dressings without issues. She denies signs of infection. She reports some issues with bleeding at times but this has improved. She denies signs of infection. 6/1; patient presents for follow-up. She was recently hospitalized for upper left leg thigh cellulitis. She was given IV cefepime and vancomycin and discharged on oral antibiotics. She has been using Dakin's wet-to-dry dressings to the left lower leg wound. She reports improvement in healing. She currently denies systemic signs of infection. 6/7; patient is using Dakin's wet-to-dry twice daily. In general this looks better than when I saw this a month or so ago however still considerable depth to the tunnel in her left leg. She is going for iron infusions ordered by her primary care doctor I believe 6/15; patient presents for follow-up. She has been using Dakin's wet-to-dry dressings. She has noted some scattered small areas that blister up and heal on her left lower leg. She has been using mupirocin ointment on them. Nothing open today. 6/23; patient's been using Dakin's wet-to-dry dressings to the tunneled wound and Hydrofera Blue to the opening. She has Christine issues or complaints today. 6/29; patient presents for follow-up. She has been using Dakin's wet-to-dry dressings to the tunneled wound and Hydrofera Blue to the opening. She states that [employer] is sticking to the wound bed. She denies signs of infection. 7/7; patient presents for follow-up. She has been using Dakin's wet-to-dry dressings to the tunneled wound and PolyMem silver to the opening without issues. 7/13; patient presents for follow-up. She has been using PolyMem silver to the opening and the rope to the tunnel. She has  Christine issues or complaints today. She denies signs of infection.  7/20; patient presents for follow-up. We have been using PolyMem silver to the wound bed. She has Christine issues or complaints today. She is receiving her custom compression garments tomorrow. 7/27; patient presents for follow-up. She has been using PolyMem silver to the wound bed. She is having her custom compression garments adjusted as these are not staying on very easily. 8/30; patient presents for follow-up. We have been using PolyMem silver to the wound bed. She is still waiting on her custom compression garments to be ordered. She denies signs of infection. 8/11; patient presents for follow-up. The wound VAC has been started and patient has been using this for the past week. Patient has home health who changes the wound VAC. She developed irritation to the periwound. DuoDERM was not being used to the periwound despite being ordered. 8/15; patient presents for follow-up. She restarted the wound VAC and has had improvement to the periwound with the use of DuoDERM. 8/24; patient presents for follow-up. She has been using the wound VAC. She has developed a wound just superior to the original wound. Likely as a result from the wound VAC. She denies signs of infection. 8/29; patient presents for follow-up. We have been using collagen to the wound bed. She has held off on the wound VAC for the past week. 9/7; patient presents for follow-up. We have been using iodoform packing to the wound bed. She has mild tenderness to the periwound. She denies systemic signs of infection. 9/12; patient presents for follow-up. Patient has been using Dakin's wet-to-dry dressings. She has Christine issues or complaints today. She denies signs of infection. 9/21; patient presents for follow-up. She has been using Dakin's wet-to-dry dressings to the wound bed. She has Christine issues or complaints today. PuraPly is available for placement and patient would like to proceed  with this. Electronic Signature(s) Signed: 05/13/2022 10:28:42 AM By: Geralyn Corwin DO Entered By: Geralyn Corwin on 05/13/2022 09:43:53 -------------------------------------------------------------------------------- Physical Exam Details Patient Name: Date of Service: Christine Rolling [name] 05/12/2022 11:15 A M Medical Record Number: [id number] Patient Account Number: [id number] Date of Birth/Sex: Treating RN: [birth year] (71 y.o. [name] Primary Care Provider: [name] Other Clinician: Referring Provider: Treating Provider/Extender: Annamary Rummage in Treatment: 24 Constitutional respirations regular, non-labored and within target range for patient.. Cardiovascular 2+ dorsalis pedis/posterior tibialis pulses. Psychiatric pleasant and cooperative. Notes Left lower extremity: Open wound with granulation tissue at the opening that has depth of about 3.5 cm. Christine increased warmth, erythema or purulent drainage. Christine tenderness on palpation. Electronic Signature(s) Signed: 05/13/2022 10:28:42 AM By: Geralyn Corwin DO Entered By: Geralyn Corwin on 05/13/2022 09:44:58 -------------------------------------------------------------------------------- Physician Orders Details Patient Name: Date of Service: Christine Rolling [name] 05/12/2022 11:15 A M Medical Record Number: [id number] Patient Account Number: [id number] Date of Birth/Sex: Treating RN: December 16, 1950 (71 y.o. [name] Primary Care Provider: [name] Other Clinician: Referring Provider: Treating Provider/Extender: Annamary Rummage in Treatment: 24 Verbal / Phone Orders: Christine Diagnosis Coding ICD-10 Coding Code Description [phone] Non-pressure chronic ulcer of other part of left lower leg with fat layer exposed T79.8XXA Other early complications of trauma, initial encounter I87.312 Chronic venous hypertension (idiopathic) with ulcer of left lower extremity I89.0  Lymphedema, not elsewhere classified I48.0 Paroxysmal atrial fibrillation Z79.01 Long term (current) use of anticoagulants Follow-up Appointments ppointment in 1 week. - Dr. Mikey Bussing and Yvonne Kendall, Room 8 Return A ppointment in 2 weeks. - Dr. Mikey Bussing and Yvonne Kendall, Room 8 Return A Anesthetic (  In clinic) Topical Lidocaine 5% applied to wound bed Cellular or Tissue Based Products Wound #1 Left,Lateral Lower Leg Cellular or Tissue Based Product Type: - 05/12/2022 Puraply AM #1 applied. Cellular or Tissue Based Product applied to wound bed, secured with steri-strips, cover with Adaptic or Mepitel. (DO NOT REMOVE). Bathing/ Shower/ Hygiene May shower with protection but do not get wound dressing(s) wet. Edema Control - Lymphedema / SCD / Other Elevate legs to the level of the heart or above for 30 minutes daily and/or when sitting, a frequency of: - 3-4 times a day throughout the day. Avoid standing for long periods of time. Moisturize legs daily. - both legs every night before bed. Home Health New wound care orders this week; continue Home Health for wound care. May utilize formulary equivalent dressing for wound treatment orders unless otherwise specified. - HOME HEALTH TO LEAVE PRIMARY DRESSING ALONE ONLY CHANGE THE SECONDARY DRESSING. Other Home Health Orders/Instructions: - [facility] Wound Treatment Wound #1 - Lower Leg Wound Laterality: Left, Lateral Cleanser: Wound Cleanser (Home Health) Every Other Day/30 Days Discharge Instructions: Cleanse the wound with wound cleanser prior to applying a clean dressing using gauze sponges, not tissue or cotton balls. Peri-Wound Care: Skin Prep Every Other Day/30 Days Discharge Instructions: Use skin prep as directed Prim Dressing: Puraply AM Every Other Day/30 Days ary Discharge Instructions: ***APPLIED ONLY IN CLINIC BY PROVIDER. Prim Dressing: adaptic and steri-strips Every Other Day/30 Days ary Discharge Instructions: ****leave in  place.*** Secondary Dressing: ABD Pad, 5x9 Every Other Day/30 Days Discharge Instructions: Apply over primary dressing as directed. Secondary Dressing: Woven Gauze Sponge, Non-Sterile 4x4 in [facility](Home Health) Every Other Day/30 Days Discharge Instructions: Apply over primary dressing as directed. Secured With: [employer] Roll Sterile, 4.5x3.1 (in/yd) Every Other Day/30 Days Discharge Instructions: Secure with Kerlix as directed. Secured With: 86M Medipore H Soft Cloth Surgical T ape, 4 x 10 (in/yd) Every Other Day/30 Days Discharge Instructions: Secure with tape as directed. Electronic Signature(s) Signed: 05/13/2022 10:28:42 AM By: Geralyn CorwinHoffman, Danie Diehl DO Previous Signature: 05/12/2022 5:58:31 PM Version By: Shawn Stalleaton, Bobbi RN, BSN Previous Signature: 05/13/2022 9:20:47 AM Version By: Geralyn CorwinHoffman, Magdaline Zollars DO Entered By: Geralyn CorwinHoffman, Caylan Chenard on 05/13/2022 09:45:08 -------------------------------------------------------------------------------- Problem List Details Patient Name: Date of Service: Christine RollingNO [name], Christine [name] 05/12/2022 11:15 A M Medical Record Number: [id number] Patient Account Number: [id number] Date of Birth/Sex: Treating RN: 03/17/1951 (71 y.o. 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/31/2023 Christine Yes T79.8XXA Other early complications of trauma, initial encounter 11/19/2021 Christine Yes I87.312 Chronic venous hypertension (idiopathic) with ulcer of left lower extremity 04/28/2022 Christine Yes I89.0 Lymphedema, not elsewhere classified 04/28/2022 Christine Yes I48.0 Paroxysmal atrial fibrillation 11/19/2021 Christine Yes Z79.01 Long term (current) use of anticoagulants 11/19/2021 Christine Yes Inactive Problems Resolved Problems Electronic  Signature(s) Signed: 05/13/2022 10:28:42 AM By: Geralyn CorwinHoffman, Evonda Enge DO Previous Signature: 05/13/2022 9:20:47 AM Version By: Geralyn CorwinHoffman, Tahmid Stonehocker DO Entered By: Geralyn CorwinHoffman, Ahlijah Raia on 05/13/2022 09:22:10 -------------------------------------------------------------------------------- Progress Note Details Patient Name: Date of Service: Christine RollingNO [name], Christine [name] 05/12/2022 11:15 A M Medical Record Number: [id number] Patient Account Number: [id number] Date of Birth/Sex: Treating RN: 06/18/1951 (71 y.o. Christine PickettF) [name], Yvonne KendallBobbi Primary Care Provider: Merri BrunettePharr, Walter Other Clinician: Referring Provider: Treating Provider/Extender: Annamary RummageHoffman, Analynn Daum Pharr, Walter Weeks in Treatment: 24 Subjective Chief Complaint Information obtained  from Patient 11/19/2021; Left lower extremity wound status post fall History of Present Illness (HPI) Admission 11/19/2021 Ms. Shauntel Prest is a 71 year old female with a past medical history of paroxysmal A-fib on Eliquis, hypothyroidism, major depressive disorder, venous insufficiency and chronic diastolic heart failure that presents to the clinic for a 1 month history of nonhealing wound to the left lower extremity. She visited the ED on 10/18/2021 after a mechanical fall. She developed a hematoma that subsequently opened. She was hospitalized for 7 days and discharged on 10/25/2021. She has been on several different antibiotics For the past month. She states that most recently she was on Levaquin and linezolid for the past week. She states she completes her antibiotic course tomorrow. She has been using Dakin's wet-to-dry dressings to the wound bed. She denies signs of infection. 4/7; patient presents for follow-up. She has been using Dakin's wet-to-dry dressings. She did end up going to the ED on 4/1 because she had excess bleeding with dressing change that she could not stop. She is on Eliquis for A-fib. In the ED they tied off a small artery. She has had Christine issues since discharge.  She denies signs of infection. 4/14; this is a very difficult clinical situation. A patient with underlying chronic venous insufficiency and lymphedema very significant lower extremity edema had a hematoma after a fall on her left upper lateral lower leg. She is on Eliquis for atrial fibrillation apparently with a history of a splenic infarct following with Dr. Berton Mount of cardiology. She has exhibited significant bleeding from the wound surface including ao Venous bleeder that required suturing short while ago. She has been using Dakin's wet-to-dry packing and over the surface of the wound. She saw Dr. Graciela Husbands yesterday he is reluctant to consider stopping the Eliquis because of the prior history of presumed cardioembolism. Wants to communicate with Dr. Mikey Bussing when she returns. In a perfect world where she was not on Eliquis she requires a wound VAC with additional compression wraps but I understand the reluctance to do this because of the concerns of bleeding 4/28; the patient's wound actually looks better today using Dakin's wet-to-dry that she is changing twice a day she is wrapping this with Kerlix and Ace wrapping. She tells me she had 2 small bleeding areas which were part of the superficial wound that stopped this week with direct pressure. Other than that Christine major issues. In follow-up from discussion of last week Dr. 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 The bigger question from my point of view is the wound VAC issue. As far as she knows and her daughter-in-law verifies that she has not had any bleeding from the deeper parts of the wound although the bleeding has been superficial including the one that sent her to the ER for stitches. She is concerned that a wound VAC would cause further bleeding and I cannot completely  allay those concerns. 5/8; patient presents for follow-up. She has Christine issues or complaints today. She has been using Dakin's wet-to-dry dressings without issues. She denies signs of infection. 5/12; patient presents for follow-up. She continues to use Dakin's wet-to-dry dressings without issues. She denies signs of infection. She reports some issues with bleeding at times but this has improved. She denies signs of infection. 6/1; patient presents for follow-up. She was recently hospitalized  for upper left leg thigh cellulitis. She was given IV cefepime and vancomycin and discharged on oral antibiotics. She has been using Dakin's wet-to-dry dressings to the left lower leg wound. She reports improvement in healing. She currently denies systemic signs of infection. 6/7; patient is using Dakin's wet-to-dry twice daily. In general this looks better than when I saw this a month or so ago however still considerable depth to the tunnel in her left leg. She is going for iron infusions ordered by her primary care doctor I believe 6/15; patient presents for follow-up. She has been using Dakin's wet-to-dry dressings. She has noted some scattered small areas that blister up and heal on her left lower leg. She has been using mupirocin ointment on them. Nothing open today. 6/23; patient's been using Dakin's wet-to-dry dressings to the tunneled wound and Hydrofera Blue to the opening. She has Christine issues or complaints today. 6/29; patient presents for follow-up. She has been using Dakin's wet-to-dry dressings to the tunneled wound and Hydrofera Blue to the opening. She states that [facility] is sticking to the wound bed. She denies signs of infection. 7/7; patient presents for follow-up. She has been using Dakin's wet-to-dry dressings to the tunneled wound and PolyMem silver to the opening without issues. 7/13; patient presents for follow-up. She has been using PolyMem silver to the opening and the rope to the  tunnel. She has Christine issues or complaints today. She denies signs of infection. 7/20; patient presents for follow-up. We have been using PolyMem silver to the wound bed. She has Christine issues or complaints today. She is receiving her custom compression garments tomorrow. 7/27; patient presents for follow-up. She has been using PolyMem silver to the wound bed. She is having her custom compression garments adjusted as these are not staying on very easily. 8/30; patient presents for follow-up. We have been using PolyMem silver to the wound bed. She is still waiting on her custom compression garments to be ordered. She denies signs of infection. 8/11; patient presents for follow-up. The wound VAC has been started and patient has been using this for the past week. Patient has home health who changes the wound VAC. She developed irritation to the periwound. DuoDERM was not being used to the periwound despite being ordered. 8/15; patient presents for follow-up. She restarted the wound VAC and has had improvement to the periwound with the use of DuoDERM. 8/24; patient presents for follow-up. She has been using the wound VAC. She has developed a wound just superior to the original wound. Likely as a result from the wound VAC. She denies signs of infection. 8/29; patient presents for follow-up. We have been using collagen to the wound bed. She has held off on the wound VAC for the past week. 9/7; patient presents for follow-up. We have been using iodoform packing to the wound bed. She has mild tenderness to the periwound. She denies systemic signs of infection. 9/12; patient presents for follow-up. Patient has been using Dakin's wet-to-dry dressings. She has Christine issues or complaints today. She denies signs of infection. 9/21; patient presents for follow-up. She has been using Dakin's wet-to-dry dressings to the wound bed. She has Christine issues or complaints today. PuraPly is available for placement and patient would  like to proceed with this. Patient History Medical History Cardiovascular Patient has history of Congestive Heart Failure, Hypertension Hospitalization/Surgery History - Cellulitis left leg- 01/03/2022-01/07/2022. Medical A Surgical History Notes nd Constitutional Symptoms (General Health) Infarction of spleen Hematologic/Lymphatic Hypothyroidism Cardiovascular A-Fib  Gastrointestinal Gastroesophageal reflux Genitourinary Chronic kidney disease Musculoskeletal Osteoarthritis of knee Psychiatric Anxiety Objective Constitutional respirations regular, non-labored and within target range for patient.. Vitals Time Taken: 11:25 AM, Height: 67 in, Weight: 340 lbs, BMI: 53.2, Temperature: 98.8 F, Pulse: 51 bpm, Respiratory Rate: 20 breaths/min, Blood Pressure: 153/80 mmHg. Cardiovascular 2+ dorsalis pedis/posterior tibialis pulses. Psychiatric pleasant and cooperative. General Notes: Left lower extremity: Open wound with granulation tissue at the opening that has depth of about 3.5 cm. Christine increased warmth, erythema or purulent drainage. Christine tenderness on palpation. Integumentary (Hair, Skin) Wound #1 status is Open. Original cause of wound was Trauma. The date acquired was: 10/18/2021. The wound has been in treatment 24 weeks. The wound is located on the Left,Lateral Lower Leg. The wound measures 0.4cm length x 0.6cm width x 0.5cm depth; 0.188cm^2 area and 0.094cm^3 volume. There is Fat Layer (Subcutaneous Tissue) exposed. There is Christine undermining noted, however, there is tunneling at 11:00 with a maximum distance of 3.8cm. There is a medium amount of serosanguineous drainage noted. The wound margin is distinct with the outline attached to the wound base. There is large (67-100%) red granulation within the wound bed. There is Christine necrotic tissue within the wound bed. Assessment Active Problems ICD-10 Non-pressure chronic ulcer of other part of left lower leg with fat layer exposed Other  early complications of trauma, initial encounter Chronic venous hypertension (idiopathic) with ulcer of left lower extremity Lymphedema, not elsewhere classified Paroxysmal atrial fibrillation Long term (current) use of anticoagulants Patient's wound is stable. Christine signs of surrounding infection. PuraPly #1 was placed in standard fashion today Into the tunneled wound. Procedures Wound #1 Pre-procedure diagnosis of Wound #1 is a Trauma, Other located on the Left,Lateral Lower Leg. A skin graft procedure using a bioengineered skin substitute/cellular or tissue based product was performed by Kalman Shan, DO with the following instrument(s): Forceps and Scissors. Puraply AM was applied and secured with Steri-Strips and adaptic. 12 sq cm of product was utilized and 0 sq cm was wasted. Post Application, Christine dressing was applied. A Time Out was conducted at 11:45, prior to the start of the procedure. The procedure was tolerated well with a pain level of 0 throughout and a pain level of 0 following the procedure. Post procedure Diagnosis Wound #1: Same as Pre-Procedure . Plan Follow-up Appointments: Return Appointment in 1 week. - Dr. Heber [place] and Tammi Klippel, Room 8 Return Appointment in 2 weeks. - Dr. Heber [place] and Tammi Klippel, Room 8 Anesthetic: (In clinic) Topical Lidocaine 5% applied to wound bed Cellular or Tissue Based Products: Wound #1 Left,Lateral Lower Leg: Cellular or Tissue Based Product Type: - 05/12/2022 Puraply AM #1 applied. Cellular or Tissue Based Product applied to wound bed, secured with steri-strips, cover with Adaptic or Mepitel. (DO NOT REMOVE). Bathing/ Shower/ Hygiene: May shower with protection but do not get wound dressing(s) wet. Edema Control - Lymphedema / SCD / Other: Elevate legs to the level of the heart or above for 30 minutes daily and/or when sitting, a frequency of: - 3-4 times a day throughout the day. Avoid standing for long periods of time. Moisturize legs daily. -  both legs every night before bed. Home Health: New wound care orders this week; continue Home Health for wound care. May utilize formulary equivalent dressing for wound treatment orders unless otherwise specified. - [place]. Other Home Health Orders/Instructions: - [facility] WOUND #1: - Lower Leg Wound Laterality: Left, Lateral Cleanser: Wound  Cleanser [facility]) Every Other Day/30 Days Discharge Instructions: Cleanse the wound with wound cleanser prior to applying a clean dressing using gauze sponges, not tissue or cotton balls. Peri-Wound Care: Skin Prep Every Other Day/30 Days Discharge Instructions: Use skin prep as directed Prim Dressing: Puraply AM Every Other Day/30 Days ary Discharge Instructions: ***APPLIED ONLY IN CLINIC BY PROVIDER. Prim Dressing: adaptic and steri-strips Every Other Day/30 Days ary Discharge Instructions: ****leave in place.*** Secondary Dressing: ABD Pad, 5x9 Every Other Day/30 Days Discharge Instructions: Apply over primary dressing as directed. Secondary Dressing: Woven Gauze Sponge, Non-Sterile 4x4 in [facility]) Every Other Day/30 Days Discharge Instructions: Apply over primary dressing as directed. Secured With: [employer], 4.5x3.1 (in/yd) Every Other Day/30 Days Discharge Instructions: Secure with Kerlix as directed. Secured With: 56M Medipore H Soft Cloth Surgical T ape, 4 x 10 (in/yd) Every Other Day/30 Days Discharge Instructions: Secure with tape as directed. 1. PuraPly #1 placed in standard fashion 2. Follow-up in 1 week Electronic Signature(s) Signed: 05/13/2022 10:28:42 AM By: Geralyn Corwin DO Entered By: Geralyn Corwin on 05/13/2022 09:50:30 -------------------------------------------------------------------------------- HxROS Details Patient Name: Date of Service: [name], Christine [name] 05/12/2022 11:15 A M Medical Record Number: [id number] Patient  Account Number: [id number] Date of Birth/Sex: Treating RN: 1951-06-10 (71 y.o. [name] Primary Care Provider: [name] Other Clinician: Referring Provider: Treating Provider/Extender: Annamary Rummage in Treatment: 24 Constitutional Symptoms (General Health) Medical History: Past Medical History Notes: Infarction of spleen Hematologic/Lymphatic Medical History: Past Medical History Notes: Hypothyroidism Cardiovascular Medical History: Positive for: Congestive Heart Failure; Hypertension Past Medical History Notes: A-Fib Gastrointestinal Medical History: Past Medical History Notes: Gastroesophageal reflux Genitourinary Medical History: Past Medical History Notes: Chronic kidney disease Musculoskeletal Medical History: Past Medical History Notes: Osteoarthritis of knee Psychiatric Medical History: Past Medical History Notes: Anxiety Immunizations Pneumococcal Vaccine: Received Pneumococcal Vaccination: Christine Implantable Devices Christine devices added Hospitalization / Surgery History Type of Hospitalization/Surgery Cellulitis left leg- 01/03/2022-01/07/2022 Electronic Signature(s) Signed: 05/13/2022 10:28:42 AM By: Geralyn Corwin DO Signed: 05/13/2022 2:51:11 PM By: Shawn Stall RN, BSN Entered By: Geralyn Corwin on 05/13/2022 09:43:58 -------------------------------------------------------------------------------- SuperBill Details Patient Name: Date of Service: [name]. 05/12/2022 Medical Record Number: [id number] Patient Account Number: [id number] Date of Birth/Sex: Treating RN: 21-Sep-1950 (71 y.o. [name] Primary Care Provider: [name] Other Clinician: Referring Provider: Treating Provider/Extender: Annamary Rummage in Treatment: 24 Diagnosis Coding ICD-10 Codes Code Description [phone] Non-pressure chronic ulcer of other part of left lower leg with fat layer exposed T79.8XXA  Other early complications of trauma, initial encounter I87.312 Chronic venous hypertension (idiopathic) with ulcer of left lower extremity I89.0 Lymphedema, not elsewhere classified I48.0 Paroxysmal atrial fibrillation Z79.01 Long term (current) use of anticoagulants Facility Procedures CPT4 Code: [id number] Description: Q4196 PuraPly AM 3X4 (12sq. cm) enter 12qty Modifier: Quantity: 12 CPT4 Code: [id number] Description: 15271 - SKIN SUB GRAFT TRNK/ARM/LEG ICD-10 Diagnosis Description L97.822 Non-pressure chronic ulcer of other part of left lower leg with fat layer expos Modifier: ed Quantity: 1 Physician Procedures : CPT4 Code Description Modifier [id number] 15271 - WC PHYS SKIN SUB GRAFT TRNK/ARM/LEG ICD-10 Diagnosis Description L97.822 Non-pressure chronic ulcer of other part of left lower leg with fat layer exposed Quantity: 1 Electronic Signature(s) Signed: 05/13/2022 10:28:42 AM By: Geralyn Corwin DO Previous Signature: 05/12/2022 5:58:31 PM Version By: Shawn Stall RN, BSN Previous Signature: 05/13/2022 9:20:47 AM Version By: Geralyn Corwin DO Entered By: Geralyn Corwin on 05/13/2022 09:50:43

## 2022-05-17 ENCOUNTER — Encounter (HOSPITAL_BASED_OUTPATIENT_CLINIC_OR_DEPARTMENT_OTHER): Payer: PPO | Admitting: Internal Medicine

## 2022-05-17 DIAGNOSIS — L97822 Non-pressure chronic ulcer of other part of left lower leg with fat layer exposed: Secondary | ICD-10-CM | POA: Diagnosis not present

## 2022-05-17 NOTE — Progress Notes (Signed)
[name] ([id number]) Visit Report for 05/17/2022 Chief Complaint Document Details Patient Name: Date of Service: [name] 05/17/2022 1:45 PM Medical Record Number: [id number] Patient Account Number: [id number] Date of Birth/Sex: Treating RN: 09/04/1950 (71 y.o. [name], [name] Primary Care Provider: Deland Pretty Other Clinician: Referring Provider: Treating Provider/Extender: Judie Grieve in Treatment: 25 Information Obtained from: Patient Chief Complaint 11/19/2021; Left lower extremity wound status post fall Electronic Signature(s) Signed: 05/17/2022 4:43:30 PM By: Kalman Shan DO Entered By: Kalman Shan on 05/17/2022 14:03:14 -------------------------------------------------------------------------------- Cellular or Tissue Based Product Details Patient Name: Date of Service: [name]. 05/17/2022 1:45 PM Medical Record Number: [id number] Patient Account Number: [id number] Date of Birth/Sex: Treating RN: August 22, 1951 (71 y.o. [name], [name] Primary Care Provider: Deland Pretty Other Clinician: Referring Provider: Treating Provider/Extender: Judie Grieve in Treatment: 25 Cellular or Tissue Based Product Type Wound #1 Left,Lateral Lower Leg Applied to: Performed By: Physician Kalman Shan, DO Cellular or Tissue Based Product Type: Puraply AM Level of Consciousness (Pre-procedure): Awake and Alert Pre-procedure Verification/Time Out Yes - 14:00 Taken: Location: trunk / arms / legs Wound Size (sq cm): 0.04 Product Size (sq cm): 12 Waste Size (sq cm): 0 Amount of Product Applied (sq cm): 12 Instrument Used: Forceps Lot #: [id number] Expiration Date: 07/12/2024 Fenestrated: No Reconstituted: No Dressing Applied: Yes Primary Dressing: ADAPTIC Procedural Pain: 0 Post Procedural Pain: 0 Response to Treatment: Procedure was tolerated well Level of Consciousness (Post-  Awake and Alert procedure): Post Procedure Diagnosis Same as Pre-procedure Electronic Signature(s) Signed: 05/17/2022 4:43:30 PM By: Kalman Shan DO Signed: 05/17/2022 4:47:31 PM By: Rhae Hammock RN Entered By: Rhae Hammock on 05/17/2022 14:17:55 -------------------------------------------------------------------------------- HPI Details Patient Name: Date of Service: Christine Simmonds J. 05/17/2022 1:45 PM Medical Record Number: [id number] Patient Account Number: [id number] Date of Birth/Sex: Treating RN: 1951/04/19 (71 y.o. [name] Primary Care Provider: Deland Pretty Other Clinician: Referring Provider: Treating Provider/Extender: Judie Grieve in Treatment: 25 History of Present Illness HPI Description: Admission 11/19/2021 [name] is a 71 year old female with a past medical history of paroxysmal A-fib on Eliquis, hypothyroidism, major depressive disorder, venous insufficiency and chronic diastolic heart failure that presents to the clinic for a 1 month history of nonhealing wound to the left lower extremity. She visited the ED on 10/18/2021 after a mechanical fall. She developed a hematoma that subsequently opened. She was hospitalized for 7 days and discharged on 10/25/2021. She has been on several different antibiotics For the past month. She states that most recently she was on Levaquin and linezolid for the past week. She states she completes her antibiotic course tomorrow. She has been using Dakin's wet-to-dry dressings to the wound bed. She denies signs of infection. 4/7; patient presents for follow-up. She has been using Dakin's wet-to-dry dressings. She did end up going to the ED on 4/1 because she had excess bleeding with dressing change that she could not stop. She is on Eliquis for A-fib. In the ED they tied off a small artery. She has had no issues since discharge. She denies signs of infection. 4/14; this is a very  difficult clinical situation. A patient with underlying chronic venous insufficiency and lymphedema very significant lower extremity edema had a hematoma after a fall on her left upper lateral lower leg. She is on Eliquis for atrial fibrillation apparently with a history of a splenic infarct following with Dr. Jolyn [name] of cardiology. She  has exhibited significant bleeding from the wound surface including ao Venous bleeder that required suturing short while ago. She has been using Dakin's wet-to-dry packing and over the surface of the wound. She saw Dr. 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 Wants to communicate with Dr. Mikey [name] when she returns. In a perfect world where she was not on Eliquis she requires a wound VAC with additional compression wraps but I understand the reluctance to do this because of the concerns of bleeding 4/28; the patient's wound actually looks better today using Dakin's wet-to-dry that she is changing twice a day she is wrapping this with Kerlix and Ace wrapping. She tells me she had 2 small bleeding areas which were part of the superficial wound that stopped this week with direct pressure. Other than that no major issues. In follow-up from discussion of last week Dr. 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 The bigger question from my point of view is the wound VAC issue. As far as she knows and her daughter-in-law verifies that she has not had any bleeding from the deeper parts of the wound although the bleeding has been superficial including the one that sent her to the ER for stitches. She is concerned that a wound VAC would cause further bleeding and I cannot completely allay those concerns. 5/8; patient presents for  follow-up. She has no issues or complaints today. She has been using Dakin's wet-to-dry dressings without issues. She denies signs of infection. 5/12; patient presents for follow-up. She continues to use Dakin's wet-to-dry dressings without issues. She denies signs of infection. She reports some issues with bleeding at times but this has improved. She denies signs of infection. 6/1; patient presents for follow-up. She was recently hospitalized for upper left leg thigh cellulitis. She was given IV cefepime and vancomycin and discharged on oral antibiotics. She has been using Dakin's wet-to-dry dressings to the left lower leg wound. She reports improvement in healing. She currently denies systemic signs of infection. 6/7; patient is using Dakin's wet-to-dry twice daily. In general this looks better than when I saw this a month or so ago however still considerable depth to the tunnel in her left leg. She is going for iron infusions ordered by her primary care doctor I believe 6/15; patient presents for follow-up. She has been using Dakin's wet-to-dry dressings. She has noted some scattered small areas that blister up and heal on her left lower leg. She has been using mupirocin ointment on them. Nothing open today. 6/23; patient's been using Dakin's wet-to-dry dressings to the tunneled wound and Hydrofera Blue to the opening. She has no issues or complaints today. 6/29; patient presents for follow-up. She has been using Dakin's wet-to-dry dressings to the tunneled wound and Hydrofera Blue to the opening. She states that [facility] is sticking to the wound bed. She denies signs of infection. 7/7; patient presents for follow-up. She has been using Dakin's wet-to-dry dressings to the tunneled wound and PolyMem silver to the opening without issues. 7/13; patient presents for follow-up. She has been using PolyMem silver to the opening and the rope to the tunnel. She has no issues or complaints today.  She denies signs of infection. 7/20; patient presents for follow-up. We have been using PolyMem silver  to the wound bed. She has no issues or complaints today. She is receiving her custom compression garments tomorrow. 7/27; patient presents for follow-up. She has been using PolyMem silver to the wound bed. She is having her custom compression garments adjusted as these are not staying on very easily. 8/30; patient presents for follow-up. We have been using PolyMem silver to the wound bed. She is still waiting on her custom compression garments to be ordered. She denies signs of infection. 8/11; patient presents for follow-up. The wound VAC has been started and patient has been using this for the past week. Patient has home health who changes the wound VAC. She developed irritation to the periwound. DuoDERM was not being used to the periwound despite being ordered. 8/15; patient presents for follow-up. She restarted the wound VAC and has had improvement to the periwound with the use of DuoDERM. 8/24; patient presents for follow-up. She has been using the wound VAC. She has developed a wound just superior to the original wound. Likely as a result from the wound VAC. She denies signs of infection. 8/29; patient presents for follow-up. We have been using collagen to the wound bed. She has held off on the wound VAC for the past week. 9/7; patient presents for follow-up. We have been using iodoform packing to the wound bed. She has mild tenderness to the periwound. She denies systemic signs of infection. 9/12; patient presents for follow-up. Patient has been using Dakin's wet-to-dry dressings. She has no issues or complaints today. She denies signs of infection. 9/21; patient presents for follow-up. PuraPly #1 was placed in standard fashion at last clinic visit. She has no issues or complaints today. She denies signs of infection. Electronic Signature(s) Signed: 05/17/2022 4:43:30 PM By: Geralyn Corwin DO Entered By: Geralyn Corwin on 05/17/2022 14:05:04 -------------------------------------------------------------------------------- Physical Exam Details Patient Name: Date of Service: [name] 05/17/2022 1:45 PM Medical Record Number: [id number] Patient Account Number: [id number] Date of Birth/Sex: Treating RN: [birth year] (71 y.o. [name] Primary Care Provider: [name] Other Clinician: Referring Provider: Treating Provider/Extender: Annamary Rummage in Treatment: 25 Constitutional respirations regular, non-labored and within target range for patient.. Cardiovascular 2+ dorsalis pedis/posterior tibialis pulses. Psychiatric pleasant and cooperative. Notes Left lower extremity: Open wound with granulation tissue at the opening with a depth of 3 cm. No increased warmth, erythema or purulent drainage. No tenderness on palpation. Electronic Signature(s) Signed: 05/17/2022 4:43:30 PM By: Geralyn Corwin DO Entered By: Geralyn Corwin on 05/17/2022 14:05:36 -------------------------------------------------------------------------------- Physician Orders Details Patient Name: Date of Service: Clois Dupes J. 05/17/2022 1:45 PM Medical Record Number: [id number] Patient Account Number: [id number] Date of Birth/Sex: Treating RN: 09-10-1950 (71 y.o. [name], [name] Primary Care Provider: [name] Other Clinician: Referring Provider: Treating Provider/Extender: Annamary Rummage in Treatment: 25 Verbal / Phone Orders: No Diagnosis Coding Follow-up Appointments ppointment in 1 week. - Dr. Mikey [name] and [facility]vonne [facility]all, Room 8 Return A ppointment in 2 weeks. - Dr. Mikey [name] and [place], Room 8 Return A Anesthetic (In clinic) Topical Lidocaine 5% applied to wound bed Cellular or Tissue Based Products Wound #1 Left,Lateral Lower Leg Cellular or Tissue Based Product Type: - 05/12/2022 Puraply AM #1  applied. 05/17/22 PURAPLY AM # 2 Cellular or Tissue Based Product applied to wound bed, secured with steri-strips, cover with Adaptic or Mepitel. (DO NOT REMOVE). Bathing/ Shower/ Hygiene May shower with protection but do not get wound dressing(s) wet. Edema Control - Lymphedema / SCD /  Other Elevate legs to the level of the heart or above for 30 minutes daily and/or when sitting, a frequency of: - 3-4 times a day throughout the day. Avoid standing for long periods of time. Moisturize legs daily. - both legs every night before bed. Home Health New wound care orders this week; continue Home Health for wound care. May utilize formulary equivalent dressing for wound treatment orders unless otherwise specified. - HOME HEALTH TO LEAVE PRIMARY DRESSING ALONE ONLY CHANGE THE SECONDARY DRESSING. Other Home Health Orders/Instructions: - [facility] Wound Treatment Wound #1 - Lower Leg Wound Laterality: Left, Lateral Cleanser: Wound Cleanser (Home Health) Every Other Day/30 Days Discharge Instructions: Cleanse the wound with wound cleanser prior to applying a clean dressing using gauze sponges, not tissue or cotton balls. Peri-Wound Care: Skin Prep Every Other Day/30 Days Discharge Instructions: Use skin prep as directed Prim Dressing: Puraply AM Every Other Day/30 Days ary Discharge Instructions: ***APPLIED ONLY IN CLINIC BY PROVIDER. Prim Dressing: adaptic and steri-strips Every Other Day/30 Days ary Discharge Instructions: ****leave in place.*** Secondary Dressing: ABD Pad, 5x9 Every Other Day/30 Days Discharge Instructions: Apply over primary dressing as directed. Secondary Dressing: T Non-adherent Dressing, 2x3 in [facility]) Every Other Day/30 Days elfa Discharge Instructions: Apply over primary dressing as directed. Secondary Dressing: Woven Gauze Sponge, Non-Sterile 4x4 in [facility]) Every Other Day/30 Days Discharge Instructions: Apply over primary dressing as directed. Secured  With: [employer], 4.5x3.1 (in/yd) [facility]) Every Other Day/30 Days Discharge Instructions: Secure with Kerlix as directed. Secured With: 31M Medipore H Soft Cloth Surgical T ape, 4 x 10 (in/yd) (Home Health) Every Other Day/30 Days Discharge Instructions: Secure with tape as directed. Electronic Signature(s) Signed: 05/17/2022 4:43:30 PM By: Geralyn Corwin DO Entered By: Geralyn Corwin on 05/17/2022 14:05:45 -------------------------------------------------------------------------------- Problem List Details Patient Name: Date of Service: [name]. 05/17/2022 1:45 PM Medical Record Number: [id number] Patient Account Number: [id number] Date of Birth/Sex: Treating RN: 03-Jun-1951 (71 y.o. 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/31/2023 No Yes T79.8XXA Other early complications of trauma, initial encounter 11/19/2021 No Yes I87.312 Chronic venous hypertension (idiopathic) with ulcer of left lower extremity 04/28/2022 No Yes I89.0 Lymphedema, not elsewhere classified 04/28/2022 No Yes I48.0 Paroxysmal atrial fibrillation 11/19/2021 No Yes Z79.01 Long term (current) use of anticoagulants 11/19/2021 No Yes Inactive Problems Resolved Problems Electronic Signature(s) Signed: 05/17/2022 4:43:30 PM By: Geralyn Corwin DO Entered By: Geralyn Corwin on 05/17/2022 14:03:00 -------------------------------------------------------------------------------- Progress Note Details Patient Name: Date of Service: [name]. 05/17/2022 1:45 PM Medical Record Number: [id number] Patient Account Number: [id number] Date of Birth/Sex: Treating RN: 05/13/1951 (71 y.o. [name], [name] Primary Care Provider: [name] Other Clinician: Referring Provider: Treating Provider/Extender: Annamary Rummage in Treatment: 25 Subjective Chief Complaint Information obtained from Patient 11/19/2021; Left lower extremity wound status post fall History of Present Illness (HPI) Admission 11/19/2021 [name] is a 71 year old female with a past medical history of paroxysmal A-fib on Eliquis, hypothyroidism, major depressive disorder, venous insufficiency and chronic diastolic heart failure that presents to the clinic for a 1 month history of nonhealing wound to the left lower extremity. She visited the ED on 10/18/2021 after a mechanical fall. She developed a hematoma that subsequently  opened. She was hospitalized for 7 days and discharged on 10/25/2021. She has been on several different antibiotics For the past month. She states that most recently she was on Levaquin and linezolid for the past week. She states she completes her antibiotic course tomorrow. She has been using Dakin's wet-to-dry dressings to the wound bed. She denies signs of infection. 4/7; patient presents for follow-up. She has been using Dakin's wet-to-dry dressings. She did end up going to the ED on 4/1 because she had excess bleeding with dressing change that she could not stop. She is on Eliquis for A-fib. In the ED they tied off a small artery. She has had no issues since discharge. She denies signs of infection. 4/14; this is a very difficult clinical situation. A patient with underlying chronic venous insufficiency and lymphedema very significant lower extremity edema had a hematoma after a fall on her left upper lateral lower leg. She is on Eliquis for atrial fibrillation apparently with a history of a splenic infarct following with Dr. Berton MountSteve Klein of cardiology. She has exhibited significant bleeding from the wound surface including ao Venous bleeder that required  suturing short while ago. She has been using Dakin's wet-to-dry packing and over the surface of the wound. She saw Dr. Graciela HusbandsKlein yesterday he is reluctant to consider stopping the Eliquis because of the prior history of presumed cardioembolism. Wants to communicate with Dr. Mikey BussingHoffman when she returns. In a perfect world where she was not on Eliquis she requires a wound VAC with additional compression wraps but I understand the reluctance to do this because of the concerns of bleeding 4/28; the patient's wound actually looks better today using Dakin's wet-to-dry that she is changing twice a day she is wrapping this with Kerlix and Ace wrapping. She tells me she had 2 small bleeding areas which were part of the superficial wound that stopped this week with direct pressure. Other than that no major issues. In follow-up from discussion of last week Dr. 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 The bigger question from my point of view is the wound VAC issue. As far as she knows and her daughter-in-law verifies that she has not had any bleeding from the deeper parts of the wound although the bleeding has been superficial including the one that sent her to the ER for stitches. She is concerned that a wound VAC would cause further bleeding and I cannot completely allay those concerns. 5/8; patient presents for follow-up. She has no issues or complaints today. She has been using Dakin's wet-to-dry dressings without issues. She denies signs of infection. 5/12; patient presents for follow-up. She continues to use Dakin's wet-to-dry dressings without issues. She denies signs of infection. She reports some issues with bleeding at times but this has improved. She denies signs of infection. 6/1; patient presents for follow-up. She was recently  hospitalized for upper left leg thigh cellulitis. She was given IV cefepime and vancomycin and discharged on oral antibiotics. She has been using Dakin's wet-to-dry dressings to the left lower leg wound. She reports improvement in healing. She currently denies systemic signs of infection. 6/7; patient is using Dakin's wet-to-dry twice daily. In general this looks better than when I saw this a month or so ago however still considerable depth to the tunnel in her left leg. She is  going for iron infusions ordered by her primary care doctor I believe 6/15; patient presents for follow-up. She has been using Dakin's wet-to-dry dressings. She has noted some scattered small areas that blister up and heal on her left lower leg. She has been using mupirocin ointment on them. Nothing open today. 6/23; patient's been using Dakin's wet-to-dry dressings to the tunneled wound and Hydrofera Blue to the opening. She has no issues or complaints today. 6/29; patient presents for follow-up. She has been using Dakin's wet-to-dry dressings to the tunneled wound and Hydrofera Blue to the opening. She states that [facility] is sticking to the wound bed. She denies signs of infection. 7/7; patient presents for follow-up. She has been using Dakin's wet-to-dry dressings to the tunneled wound and PolyMem silver to the opening without issues. 7/13; patient presents for follow-up. She has been using PolyMem silver to the opening and the rope to the tunnel. She has no issues or complaints today. She denies signs of infection. 7/20; patient presents for follow-up. We have been using PolyMem silver to the wound bed. She has no issues or complaints today. She is receiving her custom compression garments tomorrow. 7/27; patient presents for follow-up. She has been using PolyMem silver to the wound bed. She is having her custom compression garments adjusted as these are not staying on very easily. 8/30; patient presents for  follow-up. We have been using PolyMem silver to the wound bed. She is still waiting on her custom compression garments to be ordered. She denies signs of infection. 8/11; patient presents for follow-up. The wound VAC has been started and patient has been using this for the past week. Patient has home health who changes the wound VAC. She developed irritation to the periwound. DuoDERM was not being used to the periwound despite being ordered. 8/15; patient presents for follow-up. She restarted the wound VAC and has had improvement to the periwound with the use of DuoDERM. 8/24; patient presents for follow-up. She has been using the wound VAC. She has developed a wound just superior to the original wound. Likely as a result from the wound VAC. She denies signs of infection. 8/29; patient presents for follow-up. We have been using collagen to the wound bed. She has held off on the wound VAC for the past week. 9/7; patient presents for follow-up. We have been using iodoform packing to the wound bed. She has mild tenderness to the periwound. She denies systemic signs of infection. 9/12; patient presents for follow-up. Patient has been using Dakin's wet-to-dry dressings. She has no issues or complaints today. She denies signs of infection. 9/21; patient presents for follow-up. PuraPly #1 was placed in standard fashion at last clinic visit. She has no issues or complaints today. She denies signs of infection. Patient History Medical History Cardiovascular Patient has history of Congestive Heart Failure, Hypertension Hospitalization/Surgery History - Cellulitis left leg- 01/03/2022-01/07/2022. Medical A Surgical History Notes nd Constitutional Symptoms (General Health) Infarction of spleen Hematologic/Lymphatic Hypothyroidism Cardiovascular A-Fib Gastrointestinal Gastroesophageal reflux Genitourinary Chronic kidney disease Musculoskeletal Osteoarthritis of  knee Psychiatric Anxiety Objective Constitutional respirations regular, non-labored and within target range for patient.. Vitals Time Taken: 1:37 PM, Height: 67 in, Weight: 340 lbs, BMI: 53.2, Temperature: 97.9 F, Pulse: 51 bpm, Respiratory Rate: 18 breaths/min, Blood Pressure: 154/87 mmHg. Cardiovascular 2+ dorsalis pedis/posterior tibialis pulses. Psychiatric pleasant and cooperative. General Notes: Left lower extremity: Open wound with granulation tissue at the opening with a depth of 3 cm. No increased warmth, erythema or purulent drainage.  No tenderness on palpation. Integumentary (Hair, Skin) Wound #1 status is Open. Original cause of wound was Trauma. The date acquired was: 10/18/2021. The wound has been in treatment 25 weeks. The wound is located on the Left,Lateral Lower Leg. The wound measures 0.2cm length x 0.2cm width x 0.5cm depth; 0.031cm^2 area and 0.016cm^3 volume. There is Fat Layer (Subcutaneous Tissue) exposed. There is no undermining noted, however, there is tunneling at 11:00 with a maximum distance of 2.2cm. There is a medium amount of serosanguineous drainage noted. The wound margin is distinct with the outline attached to the wound base. There is large (67-100%) red granulation within the wound bed. There is no necrotic tissue within the wound bed. Wound #1 status is Open. Original cause of wound was Trauma. The date acquired was: 10/18/2021. The wound has been in treatment 25 weeks. The wound is located on the Left,Lateral Lower Leg. The wound measures 0.2cm length x 0.2cm width x 0.5cm depth; 0.031cm^2 area and 0.016cm^3 volume. There is Fat Layer (Subcutaneous Tissue) exposed. There is no undermining noted, however, there is tunneling at 11:00 with a maximum distance of 3cm. There is a medium amount of serosanguineous drainage noted. The wound margin is distinct with the outline attached to the wound base. There is large (67-100%) red granulation within the wound  bed. There is no necrotic tissue within the wound bed. Assessment Active Problems ICD-10 Non-pressure chronic ulcer of other part of left lower leg with fat layer exposed Other early complications of trauma, initial encounter Chronic venous hypertension (idiopathic) with ulcer of left lower extremity Lymphedema, not elsewhere classified Paroxysmal atrial fibrillation Long term (current) use of anticoagulants Patient's wound has shown improvement in depth since last clinic visit. PuraPly #2 was placed in standard fashion. Follow-up in 1 week. Plan Follow-up Appointments: Return Appointment in 1 week. - Dr. Mikey [name] and Yvonne Kendall, Room 8 Return Appointment in 2 weeks. - Dr. Mikey [name] and Yvonne Kendall, Room 8 Anesthetic: (In clinic) Topical Lidocaine 5% applied to wound bed Cellular or Tissue Based Products: Wound #1 Left,Lateral Lower Leg: Cellular or Tissue Based Product Type: - 05/12/2022 Puraply AM #1 applied. 05/17/22 PURAPLY AM # 2 Cellular or Tissue Based Product applied to wound bed, secured with steri-strips, cover with Adaptic or Mepitel. (DO NOT REMOVE). Bathing/ Shower/ Hygiene: May shower with protection but do not get wound dressing(s) wet. Edema Control - Lymphedema / SCD / Other: Elevate legs to the level of the heart or above for 30 minutes daily and/or when sitting, a frequency of: - 3-4 times a day throughout the day. Avoid standing for long periods of time. Moisturize legs daily. - both legs every night before bed. Home Health: New wound care orders this week; continue Home Health for wound care. May utilize formulary equivalent dressing for wound treatment orders unless otherwise specified. - HOME HEALTH TO LEAVE PRIMARY DRESSING ALONE ONLY CHANGE THE SECONDARY DRESSING. Other Home Health Orders/Instructions: - [facility] WOUND #1: - Lower Leg Wound Laterality: Left, Lateral Cleanser: Wound Cleanser (Home Health) Every Other Day/30 Days Discharge Instructions: Cleanse the wound  with wound cleanser prior to applying a clean dressing using gauze sponges, not tissue or cotton balls. Peri-Wound Care: Skin Prep Every Other Day/30 Days Discharge Instructions: Use skin prep as directed Prim Dressing: Puraply AM Every Other Day/30 Days ary Discharge Instructions: ***APPLIED ONLY IN CLINIC BY PROVIDER. Prim Dressing: adaptic and steri-strips Every Other Day/30 Days ary Discharge Instructions: ****leave in place.*** Secondary Dressing: ABD Pad, 5x9 Every Other Day/30 Days  Discharge Instructions: Apply over primary dressing as directed. Secondary Dressing: T Non-adherent Dressing, 2x3 in [facility]) Every Other Day/30 Days elfa Discharge Instructions: Apply over primary dressing as directed. Secondary Dressing: Woven Gauze Sponge, Non-Sterile 4x4 in [facility]) Every Other Day/30 Days Discharge Instructions: Apply over primary dressing as directed. Secured With: [employer], 4.5x3.1 (in/yd) [facility]) Every Other Day/30 Days Discharge Instructions: Secure with Kerlix as directed. Secured With: 58M Medipore H Soft Cloth Surgical T ape, 4 x 10 (in/yd) (Home Health) Every Other Day/30 Days Discharge Instructions: Secure with tape as directed. 1. PuraPly #2 placed in standard fashion 2. Follow-up in 1 week Electronic Signature(s) Signed: 05/17/2022 4:43:30 PM By: Geralyn Corwin DO Entered By: Geralyn Corwin on 05/17/2022 14:07:03 -------------------------------------------------------------------------------- HxROS Details Patient Name: Date of Service: Christine Rolling [name] 05/17/2022 1:45 PM Medical Record Number: [id number] Patient Account Number: [id number] Date of Birth/Sex: Treating RN: 1951-06-13 (71 y.o. [name], [name] Primary Care Provider: [name] Other Clinician: Referring Provider: Treating Provider/Extender: Annamary Rummage in Treatment: 25 Constitutional Symptoms (General Health) Medical History: Past  Medical History Notes: Infarction of spleen Hematologic/Lymphatic Medical History: Past Medical History Notes: Hypothyroidism Cardiovascular Medical History: Positive for: Congestive Heart Failure; Hypertension Past Medical History Notes: A-Fib Gastrointestinal Medical History: Past Medical History Notes: Gastroesophageal reflux Genitourinary Medical History: Past Medical History Notes: Chronic kidney disease Musculoskeletal Medical History: Past Medical History Notes: Osteoarthritis of knee Psychiatric Medical History: Past Medical History Notes: Anxiety Immunizations Pneumococcal Vaccine: Received Pneumococcal Vaccination: No Implantable Devices No devices added Hospitalization / Surgery History Type of Hospitalization/Surgery Cellulitis left leg- 01/03/2022-01/07/2022 Electronic Signature(s) Signed: 05/17/2022 4:43:30 PM By: Geralyn Corwin DO Signed: 05/17/2022 4:47:31 PM By: Fonnie Mu RN Entered By: Geralyn Corwin on 05/17/2022 14:05:09 -------------------------------------------------------------------------------- SuperBill Details Patient Name: Date of Service: [name]. 05/17/2022 Medical Record Number: [id number] Patient Account Number: [id number] Date of Birth/Sex: Treating RN: 07-22-1951 (71 y.o. [name], [name] Primary Care Provider: [name] Other Clinician: Referring Provider: Treating Provider/Extender: Annamary Rummage in Treatment: 25 Diagnosis Coding ICD-10 Codes Code Description [phone] Non-pressure chronic ulcer of other part of left lower leg with fat layer exposed T79.8XXA Other early complications of trauma, initial encounter I87.312 Chronic venous hypertension (idiopathic) with ulcer of left lower extremity I89.0 Lymphedema, not elsewhere classified I48.0 Paroxysmal atrial fibrillation Z79.01 Long term (current) use of anticoagulants Facility Procedures CPT4 Code:  [id number] Description: Q4196 PuraPly AM 3X4 (12sq. cm) enter 12qty Modifier: Quantity: 12 CPT4 Code: [id number] Description: 15271 - SKIN SUB GRAFT TRNK/ARM/LEG ICD-10 Diagnosis Description L97.822 Non-pressure chronic ulcer of other part of left lower leg with fat layer expos Modifier: ed Quantity: 1 Physician Procedures : CPT4 Code Description Modifier [id number] 15271 - WC PHYS SKIN SUB GRAFT TRNK/ARM/LEG ICD-10 Diagnosis Description L97.822 Non-pressure chronic ulcer of other part of left lower leg with fat layer exposed Quantity: 1 Electronic Signature(s) Signed: 05/17/2022 4:43:30 PM By: Geralyn Corwin DO Signed: 05/17/2022 4:47:31 PM By: Fonnie Mu RN Entered By: Fonnie Mu on 05/17/2022 14:20:01

## 2022-05-19 NOTE — Progress Notes (Signed)
[name], [name] ([id number]) Visit Report for 05/17/2022 Arrival Information Details Patient Name: Date of Service: [name] 05/17/2022 1:45 PM Medical Record Number: [id number] Patient Account Number: [id number] Date of Birth/Sex: Treating RN: 1951-05-23 (71 y.o. [name], [name] Primary Care Yahira Timberman: Deland Pretty Other Clinician: Referring Laiyah Exline: Treating Bernadett Milian/Extender: Judie Grieve in Treatment: 25 Visit Information History Since Last Visit Added or deleted any medications: No Patient Arrived: [name] Any new allergies or adverse reactions: No Arrival Time: 13:34 Had a fall or experienced change in No Accompanied By: son activities of daily living that may affect Transfer Assistance: None risk of falls: Patient Identification Verified: Yes Signs or symptoms of abuse/neglect since last visito No Secondary Verification Process Completed: Yes Hospitalized since last visit: No Patient Requires Transmission-Based Precautions: No Implantable device outside of the clinic excluding No Patient Has Alerts: Yes cellular tissue based products placed in the center Patient Alerts: Patient on Blood Thinner since last visit: Has Dressing in Place as Prescribed: Yes Pain Present Now: Yes Electronic Signature(s) Signed: 05/17/2022 4:43:35 PM By: Erenest Blank Entered By: Erenest Blank on 05/17/2022 13:35:31 -------------------------------------------------------------------------------- Encounter Discharge Information Details Patient Name: Date of Service: [name]. 05/17/2022 1:45 PM Medical Record Number: [id number] Patient Account Number: [id number] Date of Birth/Sex: Treating RN: 1950/10/02 (71 y.o. [name], [name] Primary Care Cranford Blessinger: Deland Pretty Other Clinician: Referring Kris No: Treating Lariza Cothron/Extender: Judie Grieve in Treatment: 25 Encounter Discharge Information Items Post  Procedure Vitals Discharge Condition: Stable Temperature (F): 98.7 Ambulatory Status: Walker Pulse (bpm): 74 Discharge Destination: Home Respiratory Rate (breaths/min): 17 Transportation: Private Auto Blood Pressure (mmHg): 120/80 Accompanied By: self Schedule Follow-up Appointment: Yes Clinical Summary of Care: Patient Declined Electronic Signature(s) Signed: 05/17/2022 4:47:31 PM By: Rhae Hammock RN Entered By: Rhae Hammock on 05/17/2022 14:20:32 -------------------------------------------------------------------------------- Lower Extremity Assessment Details Patient Name: Date of Service: [name]. 05/17/2022 1:45 PM Medical Record Number: [id number] Patient Account Number: [id number] Date of Birth/Sex: Treating RN: 1951-02-20 (71 y.o. [name] Primary Care Sianne Tejada: Deland Pretty Other Clinician: Referring Danaysia Rader: Treating Comer Devins/Extender: Judie Grieve in Treatment: 25 Edema Assessment Assessed: Shirlyn Goltz: No] Patrice Paradise: No] Edema: [Left: Ye] [Right: s] Calf Left: Right: Point of Measurement: 31 cm From Medial Instep 47 cm Ankle Left: Right: Point of Measurement: 9 cm From Medial Instep 25 cm Vascular Assessment Pulses: Dorsalis Pedis Palpable: [Left:Yes] Posterior Tibial Palpable: [Left:Yes] Electronic Signature(s) Signed: 05/17/2022 5:27:41 PM By: Deon Pilling RN, BSN Entered By: Deon Pilling on 05/17/2022 13:49:52 -------------------------------------------------------------------------------- Multi Wound Chart Details Patient Name: Date of Service: [name]. 05/17/2022 1:45 PM Medical Record Number: [id number] Patient Account Number: [id number] Date of Birth/Sex: Treating RN: October 23, 1950 (71 y.o. [name], [name] Primary Care Jwan Hornbaker: Deland Pretty Other Clinician: Referring Lotus Gover: Treating Marwan Lipe/Extender: Judie Grieve in Treatment: 25 Vital  Signs Height(in): 67 Pulse(bpm): 51 Weight(lbs): 340 Blood Pressure(mmHg): 154/87 Body Mass Index(BMI): 53.2 Temperature(F): 97.9 Respiratory Rate(breaths/min): 18 Photos: [1:Left, Lateral Lower Leg] [N/A:No Photos N/A Left, Lateral Lower Leg N/A] Wound Location: [1:Trauma] [N/A:Trauma N/A] Wounding Event: [1:Trauma, Other] [N/A:Trauma, Other N/A] Primary Etiology: [1:Congestive Heart Failure,] [N/A:Congestive Heart Failure, N/A] Comorbid History: [1:Hypertension 10/18/2021] [N/A:Hypertension 10/18/2021 N/A] Date Acquired: [1:25] [N/A:25 N/A] Weeks of Treatment: [1:Open] [N/A:Open N/A] Wound Status: [1:No] [N/A:No N/A] Wound Recurrence: [1:Yes] [N/A:Yes N/A] Clustered Wound: [1:1] [N/A:1 N/A] Clustered Quantity: [1:0.2x0.2x0.5] [N/A:0.2x0.2x0.5 N/A] Measurements L x W x D (cm) [1:0.031] [N/A:0.031 N/A] A (cm) :  rea [1:0.016] [N/A:0.016 N/A] Volume (cm) : [1:100.00%] [N/A:100.00% N/A] % Reduction in A rea: [1:100.00%] [N/A:100.00% N/A] % Reduction in Volume: [1:11] [N/A:11] Position 1 (o'clock): [1:2.2] [N/A:3] Maximum Distance 1 (cm): [1:Yes] [N/A:Yes N/A] Tunneling: [1:Full Thickness Without Exposed] [N/A:Full Thickness Without Exposed N/A] Classification: [1:Support Structures Medium] [N/A:Support Structures Medium N/A] Exudate Amount: [1:Serosanguineous] [N/A:Serosanguineous N/A] Exudate Type: [1:red, brown] [N/A:red, brown N/A] Exudate Color: [1:Distinct, outline attached] [N/A:Distinct, outline attached N/A] Wound Margin: [1:Large (67-100%)] [N/A:Large (67-100%) N/A] Granulation Amount: [1:Red] [N/A:Red N/A] Granulation Quality: [1:None Present (0%)] [N/A:None Present (0%) N/A] Necrotic Amount: [1:Fat Layer (Subcutaneous Tissue): Yes Fat Layer (Subcutaneous Tissue): Yes N/A] Exposed Structures: [1:Fascia: No Tendon: No Muscle: No Joint: No Bone: No Large (67-100%)] [N/A:Fascia: No Tendon: No Muscle: No Joint: No Bone: No Large (67-100%) N/A] Treatment Notes Electronic  Signature(s) Signed: 05/17/2022 4:43:30 PM By: Kalman Shan DO Signed: 05/17/2022 4:47:31 PM By: Rhae Hammock RN Entered By: Kalman Shan on 05/17/2022 14:03:05 -------------------------------------------------------------------------------- Multi-Disciplinary Care Plan Details Patient Name: Date of Service: [name]. 05/17/2022 1:45 PM Medical Record Number: [id number] Patient Account Number: [id number] Date of Birth/Sex: Treating RN: 1950/09/28 (71 y.o. [name], [name] Primary Care Kamaljit Hizer: Deland Pretty Other Clinician: Referring Milanni Ayub: Treating Cerena Baine/Extender: Judie Grieve in Treatment: 25 Active Inactive Abuse / Safety / Falls / Self Care Management Nursing Diagnoses: History of Falls Potential for falls Goals: Patient/caregiver will verbalize/demonstrate measure taken to improve self care Date Initiated: 11/19/2021 Date Inactivated: 04/05/2022 Target Resolution Date: 04/21/2022 Goal Status: Met Patient/caregiver will verbalize/demonstrate measures taken to prevent injury and/or falls Date Initiated: 11/19/2021 Target Resolution Date: 06/17/2022 Goal Status: Active Interventions: Provide education on basic hygiene Provide education on fall prevention Provide education on HBO safety Provide education on vaccinations Treatment Activities: Education provided on Basic Hygiene : 05/17/2022 Notes: Pain, Acute or Chronic Nursing Diagnoses: Pain, acute or chronic: actual or potential Potential alteration in comfort, pain Goals: Patient will verbalize adequate pain control and receive pain control interventions during procedures as needed Date Initiated: 11/19/2021 Date Inactivated: 04/05/2022 Target Resolution Date: 04/22/2022 Goal Status: Met Patient/caregiver will verbalize comfort level met Date Initiated: 11/19/2021 Target Resolution Date: 06/17/2022 Goal Status: Active Interventions: Encourage patient to take  pain medications as prescribed Provide education on pain management Reposition patient for comfort Treatment Activities: Administer pain control measures as ordered : 11/19/2021 Notes: Electronic Signature(s) Signed: 05/19/2022 4:19:15 PM By: Rhae Hammock RN Previous Signature: 05/17/2022 4:47:31 PM Version By: Rhae Hammock RN Entered By: Rhae Hammock on 05/18/2022 14:05:44 -------------------------------------------------------------------------------- Pain Assessment Details Patient Name: Date of Service: [name]. 05/17/2022 1:45 PM Medical Record Number: [id number] Patient Account Number: [id number] Date of Birth/Sex: Treating RN: August 23, 1950 (71 y.o. [name], [name] Primary Care Sir Mallis: Deland Pretty Other Clinician: Referring Kea Callan: Treating Kalob Bergen/Extender: Judie Grieve in Treatment: 25 Active Problems Location of Pain Severity and Description of Pain Patient Has Paino No Site Locations Duration of the Pain. Duration of the Pain. Constant / Intermittento Intermittent Rate the pain. Current Pain Level: 0 Character of Pain Describe the Pain: Dull, Stabbing Pain Management and Medication Current Pain Management: Electronic Signature(s) Signed: 05/17/2022 4:43:35 PM By: Erenest Blank Signed: 05/17/2022 4:47:31 PM By: Rhae Hammock RN Entered By: Erenest Blank on 05/17/2022 13:36:04 -------------------------------------------------------------------------------- Patient/Caregiver Education Details Patient Name: Date of Service: [name] 9/26/2023andnbsp1:45 PM Medical Record Number: [id number] Patient Account Number: [id number] Date of Birth/Gender: Treating RN: Nov 25, 1950 (71 y.o. Benjaman Lobe Primary Care Physician: Shelia Media,  Thayer Jew Other Clinician: Referring Physician: Treating Physician/Extender: Judie Grieve in Treatment: 25 Education  Assessment Education Provided To: Patient Education Topics Provided Basic Hygiene: Methods: Explain/Verbal Responses: Reinforcements needed, State content correctly [employer]) Signed: 05/19/2022 4:19:15 PM By: Rhae Hammock RN Entered By: Rhae Hammock on 05/18/2022 14:05:36 -------------------------------------------------------------------------------- Wound Assessment Details Patient Name: Date of Service: [name]. 05/17/2022 1:45 PM Medical Record Number: [id number] Patient Account Number: [id number] Date of Birth/Sex: Treating RN: 1951/07/23 (71 y.o. [name], [name] Primary Care Duward Allbritton: Deland Pretty Other Clinician: Referring Deshunda Thackston: Treating Everlyn Farabaugh/Extender: Judie Grieve in Treatment: 25 Wound Status Wound Number: 1 Primary Etiology: Trauma, Other Wound Location: Left, Lateral Lower Leg Wound Status: Open Wounding Event: Trauma Comorbid History: Congestive Heart Failure, Hypertension Date Acquired: 10/18/2021 Weeks Of Treatment: 25 Clustered Wound: Yes Photos Wound Measurements Length: (cm) 0.2 Width: (cm) 0.2 Depth: (cm) 0.5 Clustered Quantity: 1 Area: (cm) 0.031 Volume: (cm) 0.016 % Reduction in Area: 100% % Reduction in Volume: 100% Epithelialization: Large (67-100%) Tunneling: Yes Position (o'clock): 11 Maximum Distance: (cm) 2.2 Undermining: No Wound Description Classification: Full Thickness Without Exposed Support Structures Wound Margin: Distinct, outline attached Exudate Amount: Medium Exudate Type: Serosanguineous Exudate Color: red, brown Foul Odor After Cleansing: No Slough/Fibrino No Wound Bed Granulation Amount: Large (67-100%) Exposed Structure Granulation Quality: Red Fascia Exposed: No Necrotic Amount: None Present (0%) Fat Layer (Subcutaneous Tissue) Exposed: Yes Tendon Exposed: No Muscle Exposed: No Joint Exposed: No Bone Exposed: No Electronic  Signature(s) Signed: 05/17/2022 4:47:31 PM By: Rhae Hammock RN Signed: 05/17/2022 5:27:41 PM By: Deon Pilling RN, BSN Entered By: Deon Pilling on 05/17/2022 13:50:12 -------------------------------------------------------------------------------- Wound Assessment Details Patient Name: Date of Service: [name]. 05/17/2022 1:45 PM Medical Record Number: [id number] Patient Account Number: [id number] Date of Birth/Sex: Treating RN: 19-Jan-1951 (71 y.o. [name], [name] Primary Care Dorman Calderwood: Deland Pretty Other Clinician: Referring Jasmeet Manton: Treating Latonia Conrow/Extender: Judie Grieve in Treatment: 25 Wound Status Wound Number: 1 Primary Etiology: Trauma, Other Wound Location: Left, Lateral Lower Leg Wound Status: Open Wounding Event: Trauma Comorbid History: Congestive Heart Failure, Hypertension Date Acquired: 10/18/2021 Weeks Of Treatment: 25 Clustered Wound: Yes Wound Measurements Length: (cm) 0.2 Width: (cm) 0.2 Depth: (cm) 0.5 Clustered Quantity: 1 Area: (cm) 0.031 Volume: (cm) 0.016 % Reduction in Area: 100% % Reduction in Volume: 100% Epithelialization: Large (67-100%) Tunneling: Yes Position (o'clock): 11 Maximum Distance: (cm) 3 Undermining: No Wound Description Classification: Full Thickness Without Exposed Support Structures Wound Margin: Distinct, outline attached Exudate Amount: Medium Exudate Type: Serosanguineous Exudate Color: red, brown Foul Odor After Cleansing: No Slough/Fibrino No Wound Bed Granulation Amount: Large (67-100%) Exposed Structure Granulation Quality: Red Fascia Exposed: No Necrotic Amount: None Present (0%) Fat Layer (Subcutaneous Tissue) Exposed: Yes Tendon Exposed: No Muscle Exposed: No Joint Exposed: No Bone Exposed: No Treatment Notes Wound #1 (Lower Leg) Wound Laterality: Left, Lateral Cleanser Wound Cleanser Discharge Instruction: Cleanse the wound with wound cleanser prior to  applying a clean dressing using gauze sponges, not tissue or cotton balls. Peri-Wound Care Skin Prep Discharge Instruction: Use skin prep as directed Topical Primary Dressing Puraply AM Discharge Instruction: ***APPLIED ONLY IN CLINIC BY Benjamyn Hestand. adaptic and steri-strips Discharge Instruction: ****leave in place.*** Secondary Dressing ABD Pad, 5x9 Discharge Instruction: Apply over primary dressing as directed. T Non-adherent Dressing, 2x3 in elfa Discharge Instruction: Apply over primary dressing as directed. Woven Gauze Sponge, Non-Sterile 4x4 in Discharge Instruction: Apply over primary dressing as directed. Secured With [employer]  Roll Sterile, 4.5x3.1 (in/yd) Discharge Instruction: Secure with Kerlix as directed. 11M Medipore H Soft Cloth Surgical T ape, 4 x 10 (in/yd) Discharge Instruction: Secure with tape as directed. Compression Wrap Compression Stockings Add-Ons Electronic Signature(s) Signed: 05/17/2022 4:43:35 PM By: Erenest Blank Signed: 05/17/2022 4:47:31 PM By: Rhae Hammock RN Entered By: Erenest Blank on 05/17/2022 13:53:44 -------------------------------------------------------------------------------- Vitals Details Patient Name: Date of Service: [name]. 05/17/2022 1:45 PM Medical Record Number: [id number] Patient Account Number: [id number] Date of Birth/Sex: Treating RN: 13-Mar-1951 (71 y.o. [name], [name] Primary Care Eston Heslin: Deland Pretty Other Clinician: Referring Riyan Haile: Treating Khamille Beynon/Extender: Judie Grieve in Treatment: 25 Vital Signs Time Taken: 13:37 Temperature (F): 97.9 Height (in): 67 Pulse (bpm): 51 Weight (lbs): 340 Respiratory Rate (breaths/min): 18 Body Mass Index (BMI): 53.2 Blood Pressure (mmHg): 154/87 Reference Range: 80 - 120 mg / dl Electronic Signature(s) Signed: 05/17/2022 4:43:35 PM By: Erenest Blank Entered By: Erenest Blank on 05/17/2022 13:37:42

## 2022-05-20 DIAGNOSIS — Z96653 Presence of artificial knee joint, bilateral: Secondary | ICD-10-CM | POA: Diagnosis not present

## 2022-05-20 DIAGNOSIS — T24332D Burn of third degree of left lower leg, subsequent encounter: Secondary | ICD-10-CM | POA: Diagnosis not present

## 2022-05-20 DIAGNOSIS — J309 Allergic rhinitis, unspecified: Secondary | ICD-10-CM | POA: Diagnosis not present

## 2022-05-20 DIAGNOSIS — E871 Hypo-osmolality and hyponatremia: Secondary | ICD-10-CM | POA: Diagnosis not present

## 2022-05-20 DIAGNOSIS — I5032 Chronic diastolic (congestive) heart failure: Secondary | ICD-10-CM | POA: Diagnosis not present

## 2022-05-20 DIAGNOSIS — Z9181 History of falling: Secondary | ICD-10-CM | POA: Diagnosis not present

## 2022-05-20 DIAGNOSIS — E039 Hypothyroidism, unspecified: Secondary | ICD-10-CM | POA: Diagnosis not present

## 2022-05-20 DIAGNOSIS — F325 Major depressive disorder, single episode, in full remission: Secondary | ICD-10-CM | POA: Diagnosis not present

## 2022-05-20 DIAGNOSIS — Z7901 Long term (current) use of anticoagulants: Secondary | ICD-10-CM | POA: Diagnosis not present

## 2022-05-20 DIAGNOSIS — Z6841 Body Mass Index (BMI) 40.0 and over, adult: Secondary | ICD-10-CM | POA: Diagnosis not present

## 2022-05-20 DIAGNOSIS — I4891 Unspecified atrial fibrillation: Secondary | ICD-10-CM | POA: Diagnosis not present

## 2022-05-20 DIAGNOSIS — I110 Hypertensive heart disease with heart failure: Secondary | ICD-10-CM | POA: Diagnosis not present

## 2022-05-20 DIAGNOSIS — L03116 Cellulitis of left lower limb: Secondary | ICD-10-CM | POA: Diagnosis not present

## 2022-05-20 DIAGNOSIS — Z9049 Acquired absence of other specified parts of digestive tract: Secondary | ICD-10-CM | POA: Diagnosis not present

## 2022-05-20 DIAGNOSIS — D500 Iron deficiency anemia secondary to blood loss (chronic): Secondary | ICD-10-CM | POA: Diagnosis not present

## 2022-05-24 ENCOUNTER — Encounter (HOSPITAL_BASED_OUTPATIENT_CLINIC_OR_DEPARTMENT_OTHER): Payer: PPO | Attending: Internal Medicine | Admitting: Internal Medicine

## 2022-05-24 DIAGNOSIS — I890 Lymphedema, not elsewhere classified: Secondary | ICD-10-CM | POA: Insufficient documentation

## 2022-05-24 DIAGNOSIS — I87312 Chronic venous hypertension (idiopathic) with ulcer of left lower extremity: Secondary | ICD-10-CM | POA: Insufficient documentation

## 2022-05-24 DIAGNOSIS — N189 Chronic kidney disease, unspecified: Secondary | ICD-10-CM | POA: Insufficient documentation

## 2022-05-24 DIAGNOSIS — Z7901 Long term (current) use of anticoagulants: Secondary | ICD-10-CM | POA: Diagnosis not present

## 2022-05-24 DIAGNOSIS — W19XXXA Unspecified fall, initial encounter: Secondary | ICD-10-CM | POA: Diagnosis not present

## 2022-05-24 DIAGNOSIS — L97822 Non-pressure chronic ulcer of other part of left lower leg with fat layer exposed: Secondary | ICD-10-CM | POA: Diagnosis not present

## 2022-05-24 DIAGNOSIS — E039 Hypothyroidism, unspecified: Secondary | ICD-10-CM | POA: Diagnosis not present

## 2022-05-24 DIAGNOSIS — T798XXA Other early complications of trauma, initial encounter: Secondary | ICD-10-CM | POA: Diagnosis not present

## 2022-05-24 DIAGNOSIS — I480 Paroxysmal atrial fibrillation: Secondary | ICD-10-CM | POA: Diagnosis not present

## 2022-05-24 DIAGNOSIS — F329 Major depressive disorder, single episode, unspecified: Secondary | ICD-10-CM | POA: Insufficient documentation

## 2022-05-24 DIAGNOSIS — I872 Venous insufficiency (chronic) (peripheral): Secondary | ICD-10-CM | POA: Diagnosis not present

## 2022-05-24 DIAGNOSIS — I5032 Chronic diastolic (congestive) heart failure: Secondary | ICD-10-CM | POA: Diagnosis not present

## 2022-05-24 DIAGNOSIS — I130 Hypertensive heart and chronic kidney disease with heart failure and stage 1 through stage 4 chronic kidney disease, or unspecified chronic kidney disease: Secondary | ICD-10-CM | POA: Insufficient documentation

## 2022-05-26 ENCOUNTER — Encounter (HOSPITAL_BASED_OUTPATIENT_CLINIC_OR_DEPARTMENT_OTHER): Payer: PPO | Admitting: Internal Medicine

## 2022-05-26 NOTE — Progress Notes (Signed)
[name], [name] ([id number]) Visit Report for 05/24/2022 Chief Complaint Document Details Patient Name: Date of Service: [name] 05/24/2022 1:30 PM Medical Record Number: [id number] Patient Account Number: [id number] Date of Birth/Sex: Treating RN: 1951/03/19 (71 y.o. F) Primary Care Provider: [name] Other Clinician: Referring Provider: Treating Provider/Extender: Annamary Rummage in Treatment: 26 Information Obtained from: Patient Chief Complaint 11/19/2021; Left lower extremity wound status post fall Electronic Signature(s) Signed: 05/24/2022 4:41:45 PM By: Geralyn Corwin DO Entered By: Geralyn Corwin on 05/24/2022 14:11:14 -------------------------------------------------------------------------------- Cellular or Tissue Based Product Details Patient Name: Date of Service: Christine Rolling [name] 05/24/2022 1:30 PM Medical Record Number: [id number] Patient Account Number: [id number] Date of Birth/Sex: Treating RN: May 10, 1951 (71 y.o. [name] Primary Care Provider: [name] Other Clinician: Referring Provider: Treating Provider/Extender: Annamary Rummage in Treatment: 26 Cellular or Tissue Based Product Type Wound #1 Left,Lateral Lower Leg Applied to: Performed By: Physician Geralyn Corwin, DO Cellular or Tissue Based Product Type: Puraply AM Level of Consciousness (Pre-procedure): Awake and Alert Pre-procedure Verification/Time Out Yes - 14:05 Taken: Location: trunk / arms / legs Wound Size (sq cm): 0.2 Product Size (sq cm): 25 Waste Size (sq cm): 0 Amount of Product Applied (sq cm): 25 Lot #: [id number] Order #: 3 Expiration Date: 07/26/2027 Fenestrated: No Reconstituted: No Secured: Yes Secured With: Steri-Strips, adaptic Dressing Applied: No Procedural Pain: 0 Post Procedural Pain: 0 Response to Treatment: Procedure was tolerated well Level of Consciousness (Post- Awake and  Alert procedure): Post Procedure Diagnosis Same as Pre-procedure Electronic Signature(s) Signed: 05/24/2022 4:41:45 PM By: Geralyn Corwin DO Signed: 05/26/2022 5:16:03 PM By: Shawn Stall RN, BSN Entered By: Shawn Stall on 05/24/2022 14:09:27 -------------------------------------------------------------------------------- HPI Details Patient Name: Date of Service: [name], [name] 05/24/2022 1:30 PM Medical Record Number: [id number] Patient Account Number: [id number] Date of Birth/Sex: Treating RN: 1950/08/28 (71 y.o. F) Primary Care Provider: [name] Other Clinician: Referring Provider: Treating Provider/Extender: Annamary Rummage in Treatment: 26 History of Present Illness HPI Description: Admission 11/19/2021 [name] is a 71 year old female with a past medical history of paroxysmal A-fib on Eliquis, hypothyroidism, major depressive disorder, venous insufficiency and chronic diastolic heart failure that presents to the clinic for a 1 month history of nonhealing wound to the left lower extremity. She visited the ED on 10/18/2021 after a mechanical fall. She developed a hematoma that subsequently opened. She was hospitalized for 7 days and discharged on 10/25/2021. She has been on several different antibiotics For the past month. She states that most recently she was on Levaquin and linezolid for the past week. She states she completes her antibiotic course tomorrow. She has been using Dakin's wet-to-dry dressings to the wound bed. She denies signs of infection. 4/7; patient presents for follow-up. She has been using Dakin's wet-to-dry dressings. She did end up going to the ED on 4/1 because she had excess bleeding with dressing change that she could not stop. She is on Eliquis for A-fib. In the ED they tied off a small artery. She has had no issues since discharge. She denies signs of infection. 4/14; this is a very difficult clinical situation.  A patient with underlying chronic venous insufficiency and lymphedema very significant lower extremity edema had a hematoma after a fall on her left upper lateral lower leg. She is on Eliquis for atrial fibrillation apparently with a history of a splenic infarct following with Dr. Berton Mount of cardiology. She  has exhibited significant bleeding from the wound surface including ao Venous bleeder that required suturing short while ago. She has been using Dakin's wet-to-dry packing and over the surface of the wound. She saw Dr. Graciela Husbands yesterday he is reluctant to consider stopping the Eliquis because of the prior history of presumed cardioembolism. Wants to communicate with Dr. Mikey Bussing when she returns. In a perfect world where she was not on Eliquis she requires a wound VAC with additional compression wraps but I understand the reluctance to do this because of the concerns of bleeding 4/28; the patient's wound actually looks better today using Dakin's wet-to-dry that she is changing twice a day she is wrapping this with Kerlix and Ace wrapping. She tells me she had 2 small bleeding areas which were part of the superficial wound that stopped this week with direct pressure. Other than that no major issues. In follow-up from discussion of last week Dr. 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 The bigger question from my point of view is the wound VAC issue. As far as she knows and her daughter-in-law verifies that she has not had any bleeding from the deeper parts of the wound although the bleeding has been superficial including the one that sent her to the ER for stitches. She is concerned that a wound VAC would cause further bleeding and I cannot completely allay those concerns. 5/8; patient presents for follow-up. She has no issues or  complaints today. She has been using Dakin's wet-to-dry dressings without issues. She denies signs of infection. 5/12; patient presents for follow-up. She continues to use Dakin's wet-to-dry dressings without issues. She denies signs of infection. She reports some issues with bleeding at times but this has improved. She denies signs of infection. 6/1; patient presents for follow-up. She was recently hospitalized for upper left leg thigh cellulitis. She was given IV cefepime and vancomycin and discharged on oral antibiotics. She has been using Dakin's wet-to-dry dressings to the left lower leg wound. She reports improvement in healing. She currently denies systemic signs of infection. 6/7; patient is using Dakin's wet-to-dry twice daily. In general this looks better than when I saw this a month or so ago however still considerable depth to the tunnel in her left leg. She is going for iron infusions ordered by her primary care doctor I believe 6/15; patient presents for follow-up. She has been using Dakin's wet-to-dry dressings. She has noted some scattered small areas that blister up and heal on her left lower leg. She has been using mupirocin ointment on them. Nothing open today. 6/23; patient's been using Dakin's wet-to-dry dressings to the tunneled wound and Hydrofera Blue to the opening. She has no issues or complaints today. 6/29; patient presents for follow-up. She has been using Dakin's wet-to-dry dressings to the tunneled wound and Hydrofera Blue to the opening. She states that [facility] is sticking to the wound bed. She denies signs of infection. 7/7; patient presents for follow-up. She has been using Dakin's wet-to-dry dressings to the tunneled wound and PolyMem silver to the opening without issues. 7/13; patient presents for follow-up. She has been using PolyMem silver to the opening and the rope to the tunnel. She has no issues or complaints today. She denies signs of  infection. 7/20; patient presents for follow-up. We have been using PolyMem silver  to the wound bed. She has no issues or complaints today. She is receiving her custom compression garments tomorrow. 7/27; patient presents for follow-up. She has been using PolyMem silver to the wound bed. She is having her custom compression garments adjusted as these are not staying on very easily. 8/30; patient presents for follow-up. We have been using PolyMem silver to the wound bed. She is still waiting on her custom compression garments to be ordered. She denies signs of infection. 8/11; patient presents for follow-up. The wound VAC has been started and patient has been using this for the past week. Patient has home health who changes the wound VAC. She developed irritation to the periwound. DuoDERM was not being used to the periwound despite being ordered. 8/15; patient presents for follow-up. She restarted the wound VAC and has had improvement to the periwound with the use of DuoDERM. 8/24; patient presents for follow-up. She has been using the wound VAC. She has developed a wound just superior to the original wound. Likely as a result from the wound VAC. She denies signs of infection. 8/29; patient presents for follow-up. We have been using collagen to the wound bed. She has held off on the wound VAC for the past week. 9/7; patient presents for follow-up. We have been using iodoform packing to the wound bed. She has mild tenderness to the periwound. She denies systemic signs of infection. 9/12; patient presents for follow-up. Patient has been using Dakin's wet-to-dry dressings. She has no issues or complaints today. She denies signs of infection. 9/21; patient presents for follow-up. PuraPly #1 was placed in standard fashion at last clinic visit. She has no issues or complaints today. She denies signs of infection. 10/3; patient presents for follow-up. PuraPly #2 was placed in standard fashion at last  clinic visit. She has no issues or complaints today. She denies signs of infection. Electronic Signature(s) Signed: 05/24/2022 4:41:45 PM By: Geralyn Corwin DO Entered By: Geralyn Corwin on 05/24/2022 14:14:25 -------------------------------------------------------------------------------- Physical Exam Details Patient Name: Date of Service: Christine Rolling [name] 05/24/2022 1:30 PM Medical Record Number: [id number] Patient Account Number: [id number] Date of Birth/Sex: Treating RN: 04/05/1951 (71 y.o. F) Primary Care Provider: [name] Other Clinician: Referring Provider: Treating Provider/Extender: Annamary Rummage in Treatment: 26 Constitutional respirations regular, non-labored and within target range for patient.. Cardiovascular 2+ dorsalis pedis/posterior tibialis pulses. Psychiatric pleasant and cooperative. Notes Left lower extremity: Open wound with granulation tissue at the opening with a depth of 3 cm. No increased warmth, erythema or purulent drainage. No tenderness on palpation. Electronic Signature(s) Signed: 05/24/2022 4:41:45 PM By: Geralyn Corwin DO Entered By: Geralyn Corwin on 05/24/2022 14:14:54 -------------------------------------------------------------------------------- Physician Orders Details Patient Name: Date of Service: [name], Christine Curls [name] 05/24/2022 1:30 PM Medical Record Number: [id number] Patient Account Number: [id number] Date of Birth/Sex: Treating RN: [birth year] (71 y.o. [name] Primary Care Provider: [name] Other Clinician: Referring Provider: Treating Provider/Extender: Annamary Rummage in Treatment: 95 Verbal / Phone Orders: No Diagnosis Coding ICD-10 Coding Code Description [phone] Non-pressure chronic ulcer of other part of left lower leg with fat layer exposed T79.8XXA Other early complications of trauma, initial encounter I87.312 Chronic venous hypertension  (idiopathic) with ulcer of left lower extremity I89.0 Lymphedema, not elsewhere classified I48.0 Paroxysmal atrial fibrillation Z79.01 Long term (current) use of anticoagulants Follow-up Appointments ppointment in 1 week. - Dr. Mikey Bussing and Yvonne Kendall, Room 8 Return A ppointment in 2 weeks. - Dr. Mikey Bussing and Yvonne Kendall, Room  8 Return A Anesthetic (In clinic) Topical Lidocaine 5% applied to wound bed Cellular or Tissue Based Products Wound #1 Left,Lateral Lower Leg Cellular or Tissue Based Product Type: - 05/12/2022 Puraply AM #1 applied. 05/17/22 PURAPLY AM # 2 05/24/2022 Puraply AM #3 Cellular or Tissue Based Product applied to wound bed, secured with steri-strips, cover with Adaptic or Mepitel. (DO NOT REMOVE). Bathing/ Shower/ Hygiene May shower with protection but do not get wound dressing(s) wet. Edema Control - Lymphedema / SCD / Other Elevate legs to the level of the heart or above for 30 minutes daily and/or when sitting, a frequency of: - 3-4 times a day throughout the day. Avoid standing for long periods of time. Moisturize legs daily. - both legs every night before bed. Home Health New wound care orders this week; continue Home Health for wound care. May utilize formulary equivalent dressing for wound treatment orders unless otherwise specified. - HOME HEALTH TO LEAVE PRIMARY DRESSING ALONE ONLY CHANGE THE SECONDARY DRESSING. Other Home Health Orders/Instructions: - [facility] Wound Treatment Wound #1 - Lower Leg Wound Laterality: Left, Lateral Cleanser: Wound Cleanser (Home Health) Every Other Day/30 Days Discharge Instructions: Cleanse the wound with wound cleanser prior to applying a clean dressing using gauze sponges, not tissue or cotton balls. Peri-Wound Care: Skin Prep Every Other Day/30 Days Discharge Instructions: Use skin prep as directed Prim Dressing: Puraply AM Every Other Day/30 Days ary Discharge Instructions: ***APPLIED ONLY IN CLINIC BY PROVIDER. Prim Dressing:  adaptic and steri-strips Every Other Day/30 Days ary Discharge Instructions: ****leave in place.*** Prim Dressing: gauze and sorbact under the adaptic Every Other Day/30 Days ary Discharge Instructions: leave in place. Secondary Dressing: ABD Pad, 5x9 Every Other Day/30 Days Discharge Instructions: Apply over primary dressing as directed. Secondary Dressing: T Non-adherent Dressing, 2x3 in [facility]) Every Other Day/30 Days elfa Discharge Instructions: Apply over primary dressing as directed. Secondary Dressing: Woven Gauze Sponge, Non-Sterile 4x4 in [facility]) Every Other Day/30 Days Discharge Instructions: Apply over primary dressing as directed. Secured With: [employer], 4.5x3.1 (in/yd) [facility]) Every Other Day/30 Days Discharge Instructions: Secure with Kerlix as directed. Secured With: 45M Medipore H Soft Cloth Surgical T ape, 4 x 10 (in/yd) (Home Health) Every Other Day/30 Days Discharge Instructions: Secure with tape as directed. Electronic Signature(s) Signed: 05/24/2022 4:41:45 PM By: Geralyn Corwin DO Entered By: Geralyn Corwin on 05/24/2022 14:15:04 -------------------------------------------------------------------------------- Problem List Details Patient Name: Date of Service: [name], Christine Curls [name] 05/24/2022 1:30 PM Medical Record Number: [id number] Patient Account Number: [id number] Date of Birth/Sex: Treating RN: 01/03/1951 (71 y.o. 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/31/2023 No Yes T79.8XXA Other early complications of trauma, initial encounter 11/19/2021 No Yes I87.312 Chronic venous hypertension (idiopathic) with ulcer of left lower  extremity 04/28/2022 No Yes I89.0 Lymphedema, not elsewhere classified 04/28/2022 No Yes I48.0 Paroxysmal atrial fibrillation 11/19/2021 No Yes Z79.01 Long term (current) use of anticoagulants 11/19/2021 No Yes Inactive Problems Resolved Problems Electronic Signature(s) Signed: 05/24/2022 4:41:45 PM By: Geralyn Corwin DO Entered By: Geralyn Corwin on 05/24/2022 14:10:57 -------------------------------------------------------------------------------- Progress Note Details Patient Name: Date of Service: Christine Rolling [name] 05/24/2022 1:30 PM Medical Record Number: [id number] Patient Account Number: [id number] Date of Birth/Sex: Treating RN: Jan 13, 1951 (71 y.o. F) Primary Care  Provider: Merri BrunettePharr, Walter Other Clinician: Referring Provider: Treating Provider/Extender: Annamary RummageHoffman, Harshith Pursell Pharr, Walter Weeks in Treatment: 26 Subjective Chief Complaint Information obtained from Patient 11/19/2021; Left lower extremity wound status post fall History of Present Illness (HPI) Admission 11/19/2021 Ms. Christine PerkingGwendolyn [name] is a 71 year old female with a past medical history of paroxysmal A-fib on Eliquis, hypothyroidism, major depressive disorder, venous insufficiency and chronic diastolic heart failure that presents to the clinic for a 1 month history of nonhealing wound to the left lower extremity. She visited the ED on 10/18/2021 after a mechanical fall. She developed a hematoma that subsequently opened. She was hospitalized for 7 days and discharged on 10/25/2021. She has been on several different antibiotics For the past month. She states that most recently she was on Levaquin and linezolid for the past week. She states she completes her antibiotic course tomorrow. She has been using Dakin's wet-to-dry dressings to the wound bed. She denies signs of infection. 4/7; patient presents for follow-up. She has been using Dakin's wet-to-dry dressings. She did end up going to the ED on 4/1 because she had excess  bleeding with dressing change that she could not stop. She is on Eliquis for A-fib. In the ED they tied off a small artery. She has had no issues since discharge. She denies signs of infection. 4/14; this is a very difficult clinical situation. A patient with underlying chronic venous insufficiency and lymphedema very significant lower extremity edema had a hematoma after a fall on her left upper lateral lower leg. She is on Eliquis for atrial fibrillation apparently with a history of a splenic infarct following with Dr. Berton MountSteve Klein of cardiology. She has exhibited significant bleeding from the wound surface including ao Venous bleeder that required suturing short while ago. She has been using Dakin's wet-to-dry packing and over the surface of the wound. She saw Dr. Graciela HusbandsKlein yesterday he is reluctant to consider stopping the Eliquis because of the prior history of presumed cardioembolism. Wants to communicate with Dr. Mikey BussingHoffman when she returns. In a perfect world where she was not on Eliquis she requires a wound VAC with additional compression wraps but I understand the reluctance to do this because of the concerns of bleeding 4/28; the patient's wound actually looks better today using Dakin's wet-to-dry that she is changing twice a day she is wrapping this with Kerlix and Ace wrapping. She tells me she had 2 small bleeding areas which were part of the superficial wound that stopped this week with direct pressure. Other than that no major issues. In follow-up from discussion of last week Dr. 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 The bigger question from my point of view is the wound VAC issue. As far as she knows and her daughter-in-law verifies that she has not had any bleeding from the deeper parts of the wound although the bleeding  has been superficial including the one that sent her to the ER for stitches. She is concerned that a wound VAC would cause further bleeding and I cannot completely allay those concerns. 5/8; patient presents for follow-up. She has no issues or complaints today. She has been using Dakin's wet-to-dry dressings without issues. She denies signs of infection. 5/12; patient presents for follow-up. She continues to use Dakin's wet-to-dry dressings without issues. She denies signs of infection. She reports some issues  with bleeding at times but this has improved. She denies signs of infection. 6/1; patient presents for follow-up. She was recently hospitalized for upper left leg thigh cellulitis. She was given IV cefepime and vancomycin and discharged on oral antibiotics. She has been using Dakin's wet-to-dry dressings to the left lower leg wound. She reports improvement in healing. She currently denies systemic signs of infection. 6/7; patient is using Dakin's wet-to-dry twice daily. In general this looks better than when I saw this a month or so ago however still considerable depth to the tunnel in her left leg. She is going for iron infusions ordered by her primary care doctor I believe 6/15; patient presents for follow-up. She has been using Dakin's wet-to-dry dressings. She has noted some scattered small areas that blister up and heal on her left lower leg. She has been using mupirocin ointment on them. Nothing open today. 6/23; patient's been using Dakin's wet-to-dry dressings to the tunneled wound and Hydrofera Blue to the opening. She has no issues or complaints today. 6/29; patient presents for follow-up. She has been using Dakin's wet-to-dry dressings to the tunneled wound and Hydrofera Blue to the opening. She states that [facility] is sticking to the wound bed. She denies signs of infection. 7/7; patient presents for follow-up. She has been using Dakin's wet-to-dry dressings to the tunneled  wound and PolyMem silver to the opening without issues. 7/13; patient presents for follow-up. She has been using PolyMem silver to the opening and the rope to the tunnel. She has no issues or complaints today. She denies signs of infection. 7/20; patient presents for follow-up. We have been using PolyMem silver to the wound bed. She has no issues or complaints today. She is receiving her custom compression garments tomorrow. 7/27; patient presents for follow-up. She has been using PolyMem silver to the wound bed. She is having her custom compression garments adjusted as these are not staying on very easily. 8/30; patient presents for follow-up. We have been using PolyMem silver to the wound bed. She is still waiting on her custom compression garments to be ordered. She denies signs of infection. 8/11; patient presents for follow-up. The wound VAC has been started and patient has been using this for the past week. Patient has home health who changes the wound VAC. She developed irritation to the periwound. DuoDERM was not being used to the periwound despite being ordered. 8/15; patient presents for follow-up. She restarted the wound VAC and has had improvement to the periwound with the use of DuoDERM. 8/24; patient presents for follow-up. She has been using the wound VAC. She has developed a wound just superior to the original wound. Likely as a result from the wound VAC. She denies signs of infection. 8/29; patient presents for follow-up. We have been using collagen to the wound bed. She has held off on the wound VAC for the past week. 9/7; patient presents for follow-up. We have been using iodoform packing to the wound bed. She has mild tenderness to the periwound. She denies systemic signs of infection. 9/12; patient presents for follow-up. Patient has been using Dakin's wet-to-dry dressings. She has no issues or complaints today. She denies signs of infection. 9/21; patient presents for  follow-up. PuraPly #1 was placed in standard fashion at last clinic visit. She has no issues or complaints today. She denies signs of infection. 10/3; patient presents for follow-up. PuraPly #2 was placed in standard fashion at last clinic visit. She has no issues or complaints today.  She denies signs of infection. Patient History Medical History Cardiovascular Patient has history of Congestive Heart Failure, Hypertension Hospitalization/Surgery History - Cellulitis left leg- 01/03/2022-01/07/2022. Medical A Surgical History Notes nd Constitutional Symptoms (General Health) Infarction of spleen Hematologic/Lymphatic Hypothyroidism Cardiovascular A-Fib Gastrointestinal Gastroesophageal reflux Genitourinary Chronic kidney disease Musculoskeletal Osteoarthritis of knee Psychiatric Anxiety Objective Constitutional respirations regular, non-labored and within target range for patient.. Vitals Time Taken: 1:52 PM, Height: 67 in, Weight: 340 lbs, BMI: 53.2, Temperature: 99.4 F, Pulse: 48 bpm, Respiratory Rate: 20 breaths/min, Blood Pressure: 159/59 mmHg. Cardiovascular 2+ dorsalis pedis/posterior tibialis pulses. Psychiatric pleasant and cooperative. General Notes: Left lower extremity: Open wound with granulation tissue at the opening with a depth of 3 cm. No increased warmth, erythema or purulent drainage. No tenderness on palpation. Integumentary (Hair, Skin) Wound #1 status is Open. Original cause of wound was Trauma. The date acquired was: 10/18/2021. The wound has been in treatment 26 weeks. The wound is located on the Left,Lateral Lower Leg. The wound measures 0.4cm length x 0.5cm width x 0.5cm depth; 0.157cm^2 area and 0.079cm^3 volume. There is Fat Layer (Subcutaneous Tissue) exposed. There is no undermining noted, however, there is tunneling at 11:00 with a maximum distance of 3.4cm. There is a medium amount of serosanguineous drainage noted. The wound margin is distinct  with the outline attached to the wound base. There is large (67-100%) red granulation within the wound bed. There is no necrotic tissue within the wound bed. The periwound skin appearance did not exhibit: Callus, Crepitus, Excoriation, Induration, Rash, Scarring, Dry/Scaly, Maceration, Atrophie Blanche, Cyanosis, Ecchymosis, Hemosiderin Staining, Mottled, Pallor, Rubor, Erythema. Assessment Active Problems ICD-10 Non-pressure chronic ulcer of other part of left lower leg with fat layer exposed Other early complications of trauma, initial encounter Chronic venous hypertension (idiopathic) with ulcer of left lower extremity Lymphedema, not elsewhere classified Paroxysmal atrial fibrillation Long term (current) use of anticoagulants Patient's wound is stable. No signs of infection. PuraPly #3 was placed in standard fashion today. Follow-up in 1 week. Procedures Wound #1 Pre-procedure diagnosis of Wound #1 is a Trauma, Other located on the Left,Lateral Lower Leg. A skin graft procedure using a bioengineered skin substitute/cellular or tissue based product was performed by Geralyn Corwin, DO with the following instrument(s): N/A. Puraply AM was applied and secured with Steri-Strips and adaptic. 25 sq cm of product was utilized and 0 sq cm was wasted. Post Application, no dressing was applied. A Time Out was conducted at 14:05, prior to the start of the procedure. The procedure was tolerated well with a pain level of 0 throughout and a pain level of 0 following the procedure. Post procedure Diagnosis Wound #1: Same as Pre-Procedure . Plan Follow-up Appointments: Return Appointment in 1 week. - Dr. Mikey Bussing and Yvonne Kendall, Room 8 Return Appointment in 2 weeks. - Dr. Mikey Bussing and Yvonne Kendall, Room 8 Anesthetic: (In clinic) Topical Lidocaine 5% applied to wound bed Cellular or Tissue Based Products: Wound #1 Left,Lateral Lower Leg: Cellular or Tissue Based Product Type: - 05/12/2022 Puraply AM #1 applied.  05/17/22 PURAPLY AM # 2 05/24/2022 Puraply AM #3 Cellular or Tissue Based Product applied to wound bed, secured with steri-strips, cover with Adaptic or Mepitel. (DO NOT REMOVE). Bathing/ Shower/ Hygiene: May shower with protection but do not get wound dressing(s) wet. Edema Control - Lymphedema / SCD / Other: Elevate legs to the level of the heart or above for 30 minutes daily and/or when sitting, a frequency of: - 3-4 times a day throughout the day. Avoid standing  for long periods of time. Moisturize legs daily. - both legs every night before bed. Home Health: New wound care orders this week; continue Home Health for wound care. May utilize formulary equivalent dressing for wound treatment orders unless otherwise specified. - [place]. Other Home Health Orders/Instructions: - [facility] WOUND #1: - Lower Leg Wound Laterality: Left, Lateral Cleanser: Wound Cleanser (Home Health) Every Other Day/30 Days Discharge Instructions: Cleanse the wound with wound cleanser prior to applying a clean dressing using gauze sponges, not tissue or cotton balls. Peri-Wound Care: Skin Prep Every Other Day/30 Days Discharge Instructions: Use skin prep as directed Prim Dressing: Puraply AM Every Other Day/30 Days ary Discharge Instructions: ***APPLIED ONLY IN CLINIC BY PROVIDER. Prim Dressing: adaptic and steri-strips Every Other Day/30 Days ary Discharge Instructions: ****leave in place.*** Prim Dressing: gauze and sorbact under the adaptic Every Other Day/30 Days ary Discharge Instructions: leave in place. Secondary Dressing: ABD Pad, 5x9 Every Other Day/30 Days Discharge Instructions: Apply over primary dressing as directed. Secondary Dressing: T Non-adherent Dressing, 2x3 in [facility]) Every Other Day/30 Days elfa Discharge Instructions: Apply over primary dressing as directed. Secondary Dressing: Woven Gauze Sponge,  Non-Sterile 4x4 in [facility]) Every Other Day/30 Days Discharge Instructions: Apply over primary dressing as directed. Secured With: [employer], 4.5x3.1 (in/yd) [facility]) Every Other Day/30 Days Discharge Instructions: Secure with Kerlix as directed. Secured With: 79M Medipore H Soft Cloth Surgical T ape, 4 x 10 (in/yd) (Home Health) Every Other Day/30 Days Discharge Instructions: Secure with tape as directed. 1. PuraPly #3 placed in standard fashion 2. Follow-up in 1 week Electronic Signature(s) Signed: 05/24/2022 4:41:45 PM By: Kalman Shan DO Entered By: Kalman Shan on 05/24/2022 14:15:38 -------------------------------------------------------------------------------- HxROS Details Patient Name: Date of Service: Link Snuffer, [name] 05/24/2022 1:30 PM Medical Record Number: [id number] Patient Account Number: [id number] Date of Birth/Sex: Treating RN: March 10, 1951 (71 y.o. F) Primary Care Provider: Deland Pretty Other Clinician: Referring Provider: Treating Provider/Extender: Judie Grieve in Treatment: 26 Constitutional Symptoms (General Health) Medical History: Past Medical History Notes: Infarction of spleen Hematologic/Lymphatic Medical History: Past Medical History Notes: Hypothyroidism Cardiovascular Medical History: Positive for: Congestive Heart Failure; Hypertension Past Medical History Notes: A-Fib Gastrointestinal Medical History: Past Medical History Notes: Gastroesophageal reflux Genitourinary Medical History: Past Medical History Notes: Chronic kidney disease Musculoskeletal Medical History: Past Medical History Notes: Osteoarthritis of knee Psychiatric Medical History: Past Medical History Notes: Anxiety Immunizations Pneumococcal Vaccine: Received Pneumococcal Vaccination: No Implantable Devices No devices added Hospitalization / Surgery History Type of Hospitalization/Surgery Cellulitis left leg-  01/03/2022-01/07/2022 Electronic Signature(s) Signed: 05/24/2022 4:41:45 PM By: Kalman Shan DO Entered By: Kalman Shan on 05/24/2022 14:14:34 -------------------------------------------------------------------------------- SuperBill Details Patient Name: Date of Service: [name]. 05/24/2022 Medical Record Number: [id number] Patient Account Number: [id number] Date of Birth/Sex: Treating RN: Feb 03, 1951 (71 y.o. [name] Primary Care Provider: Deland Pretty Other Clinician: Referring Provider: Treating Provider/Extender: Judie Grieve in Treatment: 26 Diagnosis Coding ICD-10 Codes Code Description [phone] Non-pressure chronic ulcer of other part of left lower leg with fat layer exposed T79.8XXA Other early complications of trauma, initial encounter I87.312 Chronic venous hypertension (idiopathic) with ulcer of left lower extremity I89.0 Lymphedema, not elsewhere classified I48.0 Paroxysmal atrial fibrillation Z79.01 Long term (current) use of anticoagulants Facility Procedures CPT4 Code: 21308657 Description: Q4696 - PuraPly Product AM 5X5 (25 sq. cm) Modifier: Quantity: 25 CPT4 Code: 29528413 Description: 24401 -  SKIN SUB GRAFT TRNK/ARM/LEG ICD-10 Diagnosis Description L97.822 Non-pressure chronic ulcer of other part of left lower leg with fat layer expos Modifier: ed Quantity: 1 Physician Procedures : CPT4 Code Description Modifier [id number] 15271 - WC PHYS SKIN SUB GRAFT TRNK/ARM/LEG ICD-10 Diagnosis Description L97.822 Non-pressure chronic ulcer of other part of left lower leg with fat layer exposed Quantity: 1 Electronic Signature(s) Signed: 05/24/2022 4:41:45 PM By: Geralyn Corwin DO Entered By: Geralyn Corwin on 05/24/2022 14:15:45

## 2022-05-26 NOTE — Progress Notes (Signed)
Christine, [name] ([id number]) Visit Report for 05/24/2022 Arrival Information Details Patient Name: Date of Service: [name] 05/24/2022 1:30 PM Medical Record Number: [id number] Patient Account Number: [id number] Date of Birth/Sex: Treating RN: [birth year] (71 y.o. [name], [name] Primary Care Ghazi Rumpf: Deland Pretty Other Clinician: Referring Twisha Vanpelt: Treating Kayson Bullis/Extender: Judie Grieve in Treatment: 26 Visit Information History Since Last Visit Added or deleted any medications: No Patient Arrived: [name] Any new allergies or adverse reactions: No Arrival Time: 13:50 Had a fall or experienced change in No Accompanied By: self activities of daily living that may affect Transfer Assistance: None risk of falls: Patient Identification Verified: Yes Signs or symptoms of abuse/neglect since last visito No Secondary Verification Process Completed: Yes Hospitalized since last visit: No Patient Requires Transmission-Based Precautions: No Implantable device outside of the clinic excluding No Patient Has Alerts: Yes cellular tissue based products placed in the center Patient Alerts: Patient on Blood Thinner since last visit: Has Dressing in Place as Prescribed: Yes Pain Present Now: No Electronic Signature(s) Signed: 05/26/2022 5:16:03 PM By: Deon Pilling RN, BSN Entered By: Deon Pilling on 05/24/2022 13:52:34 -------------------------------------------------------------------------------- Encounter Discharge Information Details Patient Name: Date of Service: Christine Debar [name] 05/24/2022 1:30 PM Medical Record Number: [id number] Patient Account Number: [id number] Date of Birth/Sex: Treating RN: 09-02-1950 (71 y.o. [name] Primary Care Lukas Pelcher: Deland Pretty Other Clinician: Referring Aijah Lattner: Treating Tania Perrott/Extender: Judie Grieve in Treatment: 26 Encounter Discharge Information Items Post  Procedure Vitals Discharge Condition: Stable Temperature (F): 98.7 Ambulatory Status: Walker Pulse (bpm): 49 Discharge Destination: Home Respiratory Rate (breaths/min): 20 Transportation: Private Auto Blood Pressure (mmHg): 159/59 Accompanied By: self Schedule Follow-up Appointment: Yes Clinical Summary of Care: Electronic Signature(s) Signed: 05/26/2022 5:16:03 PM By: Deon Pilling RN, BSN Entered By: Deon Pilling on 05/24/2022 14:18:51 -------------------------------------------------------------------------------- Lower Extremity Assessment Details Patient Name: Date of Service: Christine Simmonds J. 05/24/2022 1:30 PM Medical Record Number: [id number] Patient Account Number: [id number] Date of Birth/Sex: Treating RN: [birth year] (71 y.o. [name] Primary Care Jenevieve Kirschbaum: Deland Pretty Other Clinician: Referring Lindsy Cerullo: Treating Brookelyn Gaynor/Extender: Judie Grieve in Treatment: 26 Edema Assessment Assessed: Shirlyn Goltz: No] Patrice Paradise: No] E[Left: dema] [Right: :] Calf Left: Right: Point of Measurement: 31 cm From Medial Instep Ankle Left: Right: Point of Measurement: 9 cm From Medial Instep Electronic Signature(s) Signed: 05/26/2022 5:16:03 PM By: Deon Pilling RN, BSN Entered By: Deon Pilling on 05/24/2022 13:53:37 -------------------------------------------------------------------------------- Multi Wound Chart Details Patient Name: Date of Service: Christine Debar [name] 05/24/2022 1:30 PM Medical Record Number: [id number] Patient Account Number: [id number] Date of Birth/Sex: Treating RN: Apr 05, 1951 (71 y.o. F) Primary Care [name]: Deland Pretty Other Clinician: Referring Yehoshua Vitelli: Treating Tyanne Derocher/Extender: Judie Grieve in Treatment: 26 Vital Signs Height(in): 67 Pulse(bpm): 48 Weight(lbs): 340 Blood Pressure(mmHg): 159/59 Body Mass Index(BMI): 53.2 Temperature(F): 99.4 Respiratory Rate(breaths/min):  20 Photos: [N/A:N/A] Left, Lateral Lower Leg N/A N/A Wound Location: Trauma N/A N/A Wounding Event: Trauma, Other N/A N/A Primary Etiology: Congestive Heart Failure, N/A N/A Comorbid History: Hypertension 10/18/2021 N/A N/A Date Acquired: 73 N/A N/A Weeks of Treatment: Open N/A N/A Wound Status: No N/A N/A Wound Recurrence: Yes N/A N/A Clustered Wound: 1 N/A N/A Clustered Quantity: 0.4x0.5x0.5 N/A N/A Measurements L x W x D (cm) 0.157 N/A N/A A (cm) : rea 0.079 N/A N/A Volume (cm) : 99.90% N/A N/A % Reduction in A rea: 100.00% N/A N/A % Reduction in Volume: 11 Position 1 (o'clock):  3.4 Maximum Distance 1 (cm): Yes N/A N/A Tunneling: Full Thickness Without Exposed N/A N/A Classification: Support Structures Medium N/A N/A Exudate Amount: Serosanguineous N/A N/A Exudate Type: red, brown N/A N/A Exudate Color: Distinct, outline attached N/A N/A Wound Margin: Large (67-100%) N/A N/A Granulation Amount: Red N/A N/A Granulation Quality: None Present (0%) N/A N/A Necrotic Amount: Fat Layer (Subcutaneous Tissue): Yes N/A N/A Exposed Structures: Fascia: No Tendon: No Muscle: No Joint: No Bone: No Large (67-100%) N/A N/A Epithelialization: Excoriation: No N/A N/A Periwound Skin Texture: Induration: No Callus: No Crepitus: No Rash: No Scarring: No Maceration: No N/A N/A Periwound Skin Moisture: Dry/Scaly: No Atrophie Blanche: No N/A N/A Periwound Skin Color: Cyanosis: No Ecchymosis: No Erythema: No Hemosiderin Staining: No Mottled: No Pallor: No Rubor: No Cellular or Tissue Based Product N/A N/A Procedures Performed: Treatment Notes Electronic Signature(s) Signed: 05/24/2022 4:41:45 PM By: Kalman Shan DO Entered By: Kalman Shan on 05/24/2022 14:11:04 -------------------------------------------------------------------------------- Multi-Disciplinary Care Plan Details Patient Name: Date of Service: Christine Debar [name]  05/24/2022 1:30 PM Medical Record Number: [id number] Patient Account Number: [id number] Date of Birth/Sex: Treating RN: [birth year] (71 y.o. [name], Meta.Reding Primary Care Raegen Tarpley: Deland Pretty Other Clinician: Referring Lexington Devine: Treating Golden Gilreath/Extender: Judie Grieve in Treatment: 26 Active Inactive Abuse / Safety / Falls / Self Care Management Nursing Diagnoses: History of Falls Potential for falls Goals: Patient/caregiver will verbalize/demonstrate measure taken to improve self care Date Initiated: 11/19/2021 Date Inactivated: 04/05/2022 Target Resolution Date: 04/21/2022 Goal Status: Met Patient/caregiver will verbalize/demonstrate measures taken to prevent injury and/or falls Date Initiated: 11/19/2021 Target Resolution Date: 06/17/2022 Goal Status: Active Interventions: Provide education on basic hygiene Provide education on fall prevention Provide education on HBO safety Provide education on vaccinations Treatment Activities: Education provided on Basic Hygiene : 05/17/2022 Notes: Pain, Acute or Chronic Nursing Diagnoses: Pain, acute or chronic: actual or potential Potential alteration in comfort, pain Goals: Patient will verbalize adequate pain control and receive pain control interventions during procedures as needed Date Initiated: 11/19/2021 Date Inactivated: 04/05/2022 Target Resolution Date: 04/22/2022 Goal Status: Met Patient/caregiver will verbalize comfort level met Date Initiated: 11/19/2021 Target Resolution Date: 06/17/2022 Goal Status: Active Interventions: Encourage patient to take pain medications as prescribed Provide education on pain management Reposition patient for comfort Treatment Activities: Administer pain control measures as ordered : 11/19/2021 Notes: Electronic Signature(s) Signed: 05/26/2022 5:16:03 PM By: Deon Pilling RN, BSN Entered By: Deon Pilling on 05/24/2022  13:55:00 -------------------------------------------------------------------------------- Pain Assessment Details Patient Name: Date of Service: Christine Simmonds J. 05/24/2022 1:30 PM Medical Record Number: [id number] Patient Account Number: [id number] Date of Birth/Sex: Treating RN: 1951-04-23 (71 y.o. [name] Primary Care Kyser Wandel: Deland Pretty Other Clinician: Referring Kawehi Hostetter: Treating Crescentia Boutwell/Extender: Judie Grieve in Treatment: 26 Active Problems Location of Pain Severity and Description of Pain Patient Has Paino No Site Locations Pain Management and Medication Current Pain Management: Electronic Signature(s) Signed: 05/26/2022 5:16:03 PM By: Deon Pilling RN, BSN Entered By: Deon Pilling on 05/24/2022 13:53:24 -------------------------------------------------------------------------------- Patient/Caregiver Education Details Patient Name: Date of Service: [name] 10/3/2023andnbsp1:30 PM Medical Record Number: [id number] Patient Account Number: [id number] Date of Birth/Gender: Treating RN: 1951-03-16 (71 y.o. [name] Primary Care Physician: Deland Pretty Other Clinician: Referring Physician: Treating Physician/Extender: Judie Grieve in Treatment: 26 Education Assessment Education Provided To: Patient Education Topics Provided Pain: Handouts: A Guide to Pain Control Methods: Explain/Verbal Responses: Reinforcements needed Electronic Signature(s) Signed: 05/26/2022 5:16:03 PM By: Deon Pilling  RN, BSN Entered By: Deon Pilling on 05/24/2022 13:55:11 -------------------------------------------------------------------------------- Wound Assessment Details Patient Name: Date of Service: [name] 05/24/2022 1:30 PM Medical Record Number: [id number] Patient Account Number: [id number] Date of Birth/Sex: Treating RN: [birth year] (71 y.o. [name], Meta.Reding Primary Care  Rudolpho Claxton: Deland Pretty Other Clinician: Referring Kymir Coles: Treating Mirza Kidney/Extender: Judie Grieve in Treatment: 26 Wound Status Wound Number: 1 Primary Etiology: Trauma, Other Wound Location: Left, Lateral Lower Leg Wound Status: Open Wounding Event: Trauma Comorbid History: Congestive Heart Failure, Hypertension Date Acquired: 10/18/2021 Weeks Of Treatment: 26 Clustered Wound: Yes Photos Wound Measurements Length: (cm) 0.4 Width: (cm) 0.5 Depth: (cm) 0.5 Clustered Quantity: 1 Area: (cm) 0.157 Volume: (cm) 0.079 % Reduction in Area: 99.9% % Reduction in Volume: 100% Epithelialization: Large (67-100%) Tunneling: Yes Position (o'clock): 11 Maximum Distance: (cm) 3.4 Undermining: No Wound Description Classification: Full Thickness Without Exposed Support Structures Wound Margin: Distinct, outline attached Exudate Amount: Medium Exudate Type: Serosanguineous Exudate Color: red, brown Foul Odor After Cleansing: No Slough/Fibrino No Wound Bed Granulation Amount: Large (67-100%) Exposed Structure Granulation Quality: Red Fascia Exposed: No Necrotic Amount: None Present (0%) Fat Layer (Subcutaneous Tissue) Exposed: Yes Tendon Exposed: No Muscle Exposed: No Joint Exposed: No Bone Exposed: No Periwound Skin Texture Texture Color No Abnormalities Noted: No No Abnormalities Noted: No Callus: No Atrophie Blanche: No Crepitus: No Cyanosis: No Excoriation: No Ecchymosis: No Induration: No Erythema: No Rash: No Hemosiderin Staining: No Scarring: No Mottled: No Pallor: No Moisture Rubor: No No Abnormalities Noted: No Dry / Scaly: No Maceration: No Treatment Notes Wound #1 (Lower Leg) Wound Laterality: Left, Lateral Cleanser Wound Cleanser Discharge Instruction: Cleanse the wound with wound cleanser prior to applying a clean dressing using gauze sponges, not tissue or cotton balls. Peri-Wound Care Skin Prep Discharge  Instruction: Use skin prep as directed Topical Primary Dressing Puraply AM Discharge Instruction: ***APPLIED ONLY IN CLINIC BY Eilyn Polack. adaptic and steri-strips Discharge Instruction: ****leave in place.*** gauze and sorbact under the adaptic Discharge Instruction: leave in place. Secondary Dressing ABD Pad, 5x9 Discharge Instruction: Apply over primary dressing as directed. T Non-adherent Dressing, 2x3 in elfa Discharge Instruction: Apply over primary dressing as directed. Woven Gauze Sponge, Non-Sterile 4x4 in Discharge Instruction: Apply over primary dressing as directed. Secured With [employer], 4.5x3.1 (in/yd) Discharge Instruction: Secure with Kerlix as directed. 18M Medipore H Soft Cloth Surgical T ape, 4 x 10 (in/yd) Discharge Instruction: Secure with tape as directed. Compression Wrap Compression Stockings Add-Ons Electronic Signature(s) Signed: 05/24/2022 4:28:27 PM By: Erenest Blank Signed: 05/26/2022 5:16:03 PM By: Deon Pilling RN, BSN Entered By: Erenest Blank on 05/24/2022 13:51:23 -------------------------------------------------------------------------------- Vitals Details Patient Name: Date of Service: Link Snuffer, [name] 05/24/2022 1:30 PM Medical Record Number: [id number] Patient Account Number: [id number] Date of Birth/Sex: Treating RN: [birth year] (71 y.o. [name], Meta.Reding Primary Care Shannah Conteh: Deland Pretty Other Clinician: Referring Harvin Konicek: Treating Janeah Kovacich/Extender: Judie Grieve in Treatment: 26 Vital Signs Time Taken: 13:52 Temperature (F): 99.4 Height (in): 67 Pulse (bpm): 48 Weight (lbs): 340 Respiratory Rate (breaths/min): 20 Body Mass Index (BMI): 53.2 Blood Pressure (mmHg): 159/59 Reference Range: 80 - 120 mg / dl Electronic Signature(s) Signed: 05/26/2022 5:16:03 PM By: Deon Pilling RN, BSN Entered By: Deon Pilling on 05/24/2022 13:53:06

## 2022-05-27 DIAGNOSIS — I4891 Unspecified atrial fibrillation: Secondary | ICD-10-CM | POA: Diagnosis not present

## 2022-05-27 DIAGNOSIS — Z7901 Long term (current) use of anticoagulants: Secondary | ICD-10-CM | POA: Diagnosis not present

## 2022-05-27 DIAGNOSIS — J309 Allergic rhinitis, unspecified: Secondary | ICD-10-CM | POA: Diagnosis not present

## 2022-05-27 DIAGNOSIS — Z9049 Acquired absence of other specified parts of digestive tract: Secondary | ICD-10-CM | POA: Diagnosis not present

## 2022-05-27 DIAGNOSIS — Z9181 History of falling: Secondary | ICD-10-CM | POA: Diagnosis not present

## 2022-05-27 DIAGNOSIS — I110 Hypertensive heart disease with heart failure: Secondary | ICD-10-CM | POA: Diagnosis not present

## 2022-05-27 DIAGNOSIS — Z6841 Body Mass Index (BMI) 40.0 and over, adult: Secondary | ICD-10-CM | POA: Diagnosis not present

## 2022-05-27 DIAGNOSIS — F325 Major depressive disorder, single episode, in full remission: Secondary | ICD-10-CM | POA: Diagnosis not present

## 2022-05-27 DIAGNOSIS — T24332D Burn of third degree of left lower leg, subsequent encounter: Secondary | ICD-10-CM | POA: Diagnosis not present

## 2022-05-27 DIAGNOSIS — D500 Iron deficiency anemia secondary to blood loss (chronic): Secondary | ICD-10-CM | POA: Diagnosis not present

## 2022-05-27 DIAGNOSIS — I5032 Chronic diastolic (congestive) heart failure: Secondary | ICD-10-CM | POA: Diagnosis not present

## 2022-05-27 DIAGNOSIS — E039 Hypothyroidism, unspecified: Secondary | ICD-10-CM | POA: Diagnosis not present

## 2022-05-27 DIAGNOSIS — E871 Hypo-osmolality and hyponatremia: Secondary | ICD-10-CM | POA: Diagnosis not present

## 2022-05-27 DIAGNOSIS — Z96653 Presence of artificial knee joint, bilateral: Secondary | ICD-10-CM | POA: Diagnosis not present

## 2022-05-27 DIAGNOSIS — L03116 Cellulitis of left lower limb: Secondary | ICD-10-CM | POA: Diagnosis not present

## 2022-05-31 ENCOUNTER — Encounter (HOSPITAL_BASED_OUTPATIENT_CLINIC_OR_DEPARTMENT_OTHER): Payer: PPO | Admitting: Internal Medicine

## 2022-05-31 DIAGNOSIS — I87312 Chronic venous hypertension (idiopathic) with ulcer of left lower extremity: Secondary | ICD-10-CM

## 2022-05-31 DIAGNOSIS — L97822 Non-pressure chronic ulcer of other part of left lower leg with fat layer exposed: Secondary | ICD-10-CM

## 2022-05-31 DIAGNOSIS — T798XXA Other early complications of trauma, initial encounter: Secondary | ICD-10-CM | POA: Diagnosis not present

## 2022-05-31 DIAGNOSIS — I890 Lymphedema, not elsewhere classified: Secondary | ICD-10-CM | POA: Diagnosis not present

## 2022-05-31 NOTE — Progress Notes (Addendum)
OLUWATAMILORE, STARNES ([id number]) [id number] Page 1 of 9 Visit Report for 05/31/2022 Arrival Information Details Patient Name: Date of Service: [name] 05/31/2022 1:30 PM Medical Record Number: [id number] Patient Account Number: [id number] Date of Birth/Sex: Treating RN: [birth year] (71 y.o. F) Primary Care Cristofer Yaffe: Deland Pretty Other Clinician: Referring Verner Mccrone: Treating Severino Paolo/Extender: Judie Grieve in Treatment: 27 Visit Information History Since Last Visit Added or deleted any medications: No Patient Arrived: Walker Any new allergies or adverse reactions: No Arrival Time: 13:22 Had a fall or experienced change in No Accompanied By: son activities of daily living that may affect Transfer Assistance: None risk of falls: Patient Identification Verified: Yes Signs or symptoms of abuse/neglect since last visito No Secondary Verification Process Completed: Yes Hospitalized since last visit: No Patient Requires Transmission-Based Precautions: No Implantable device outside of the clinic excluding No Patient Has Alerts: Yes cellular tissue based products placed in the center Patient Alerts: Patient on Blood Thinner since last visit: Has Dressing in Place as Prescribed: Yes Pain Present Now: No Electronic Signature(s) Signed: 05/31/2022 2:19:52 PM By: Erenest Blank Entered By: Erenest Blank on 05/31/2022 13:24:01 -------------------------------------------------------------------------------- Clinic Level of Care Assessment Details Patient Name: Date of Service: [name] 05/31/2022 1:30 PM Medical Record Number: [id number] Patient Account Number: [id number] Date of Birth/Sex: Treating RN: 05-18-1951 (71 y.o. [name] Primary Care Jarmar Rousseau: Deland Pretty Other Clinician: Referring Sweta Halseth: Treating Christain Mcraney/Extender: Judie Grieve in Treatment: 27 Clinic Level of  Care Assessment Items TOOL 4 Quantity Score X- 1 0 Use when only an EandM is performed on FOLLOW-UP visit ASSESSMENTS - Nursing Assessment / Reassessment X- 1 10 Reassessment of Co-morbidities (includes updates in patient status) X- 1 5 Reassessment of Adherence to Treatment Plan ASSESSMENTS - Wound and Skin A ssessment / Reassessment X - Simple Wound Assessment / Reassessment - one wound 1 5 []  - 0 Complex Wound Assessment / Reassessment - multiple wounds X- 1 10 Dermatologic / Skin Assessment (not related to wound area) ASSESSMENTS - Focused Assessment []  - 0 Circumferential Edema Measurements - multi extremities []  - 0 Nutritional Assessment / Counseling / Intervention Christine, [name] ([id number]) [id number] Page 2 of 9 []  - 0 Lower Extremity Assessment (monofilament, tuning fork, pulses) []  - 0 Peripheral Arterial Disease Assessment (using hand held doppler) ASSESSMENTS - Ostomy and/or Continence Assessment and Care []  - 0 Incontinence Assessment and Management []  - 0 Ostomy Care Assessment and Management (repouching, etc.) PROCESS - Coordination of Care X - Simple Patient / Family Education for ongoing care 1 15 []  - 0 Complex (extensive) Patient / Family Education for ongoing care X- 1 10 Staff obtains Programmer, systems, Records, T Results / Process Orders est X- 1 10 Staff telephones HHA, Nursing Homes / Clarify orders / etc []  - 0 Routine Transfer to another Facility (non-emergent condition) []  - 0 Routine Hospital Admission (non-emergent condition) []  - 0 New Admissions / Biomedical engineer / Ordering NPWT Apligraf, etc. , []  - 0 Emergency Hospital Admission (emergent condition) X- 1 10 Simple Discharge Coordination []  - 0 Complex (extensive) Discharge Coordination PROCESS - Special Needs []  - 0 Pediatric / Minor Patient Management []  - 0 Isolation Patient Management []  - 0 Hearing / Language / Visual special needs []  -  0 Assessment of Community assistance (transportation, D/C planning, etc.) []  - 0 Additional assistance / Altered mentation []  - 0 Support Surface(s) Assessment (bed, cushion, seat, etc.) INTERVENTIONS - Wound Cleansing / Measurement X -  Simple Wound Cleansing - one wound 1 5 []  - 0 Complex Wound Cleansing - multiple wounds X- 1 5 Wound Imaging (photographs - any number of wounds) []  - 0 Wound Tracing (instead of photographs) X- 1 5 Simple Wound Measurement - one wound []  - 0 Complex Wound Measurement - multiple wounds INTERVENTIONS - Wound Dressings X - Small Wound Dressing one or multiple wounds 1 10 []  - 0 Medium Wound Dressing one or multiple wounds []  - 0 Large Wound Dressing one or multiple wounds []  - 0 Application of Medications - topical []  - 0 Application of Medications - injection INTERVENTIONS - Miscellaneous []  - 0 External ear exam []  - 0 Specimen Collection (cultures, biopsies, blood, body fluids, etc.) []  - 0 Specimen(s) / Culture(s) sent or taken to Lab for analysis []  - 0 Patient Transfer (multiple staff / Civil Service fast streamer / Similar devices) []  - 0 Simple Staple / Suture removal (25 or less) []  - 0 Complex Staple / Suture removal (26 or more) []  - 0 Hypo / Hyperglycemic Management (close monitor of Blood Glucose) Christine, [name] ([id number]) [id number] Page 3 of 9 []  - 0 Ankle / Brachial Index (ABI) - do not check if billed separately X- 1 5 Vital Signs Has the patient been seen at the hospital within the last three years: Yes Total Score: 105 Level Of Care: New/Established - Level 3 Electronic Signature(s) Signed: 05/31/2022 6:12:04 PM By: Deon Pilling RN, BSN Entered By: Deon Pilling on 05/31/2022 14:41:50 -------------------------------------------------------------------------------- Encounter Discharge Information Details Patient Name: Date of Service: Christine Debar [name] 05/31/2022 1:30 PM Medical Record  Number: [id number] Patient Account Number: [id number] Date of Birth/Sex: Treating RN: 04/10/1951 (71 y.o. [name] Primary Care Kashawna Manzer: Deland Pretty Other Clinician: Referring Hansen Carino: Treating Matie Dimaano/Extender: Judie Grieve in Treatment: 27 Encounter Discharge Information Items Discharge Condition: Stable Ambulatory Status: Walker Discharge Destination: Home Transportation: Private Auto Accompanied By: self Schedule Follow-up Appointment: Yes Clinical Summary of Care: Electronic Signature(s) Signed: 05/31/2022 6:12:04 PM By: Deon Pilling RN, BSN Entered By: Deon Pilling on 05/31/2022 14:42:18 -------------------------------------------------------------------------------- Lower Extremity Assessment Details Patient Name: Date of Service: Christine Debar [name] 05/31/2022 1:30 PM Medical Record Number: [id number] Patient Account Number: [id number] Date of Birth/Sex: Treating RN: 03-02-1951 (71 y.o. F) Primary Care Kareen Jefferys: Deland Pretty Other Clinician: Referring Natlie Asfour: Treating Briseida Gittings/Extender: Judie Grieve in Treatment: 27 Edema Assessment Assessed: [Left: No] [Right: No] [Left: Edema] [Right: :] Calf Left: Right: Point of Measurement: 31 cm From Medial Instep 47.5 cm Ankle Left: Right: Point of Measurement: 9 cm From Medial Instep 25.5 cm Electronic Signature(s) Signed: 05/31/2022 2:19:52 PM By: Leonette Most ([id number]) By: Marian Sorrow.pdf Page 4 of 9 Signed: 05/31/2022 2:19:52 PM Entered By: Erenest Blank on 05/31/2022 13:33:36 -------------------------------------------------------------------------------- Multi Wound Chart Details Patient Name: Date of Service: [name] 05/31/2022 1:30 PM Medical Record Number: [id number] Patient Account Number: [id number] Date of Birth/Sex: Treating RN: 06-Apr-1951 (71 y.o. F) Primary Care  Jona Erkkila: Deland Pretty Other Clinician: Referring Raylin Diguglielmo: Treating Mitcheal Sweetin/Extender: Judie Grieve in Treatment: 27 Vital Signs Height(in): 67 Pulse(bpm): 51 Weight(lbs): 340 Blood Pressure(mmHg): 153/80 Body Mass Index(BMI): 53.2 Temperature(F): 97.8 Respiratory Rate(breaths/min): 18 [1:Photos:] [N/A:N/A] Left, Lateral Lower Leg N/A N/A Wound Location: Trauma N/A N/A Wounding Event: Trauma, Other N/A N/A Primary Etiology: Congestive Heart Failure, N/A N/A Comorbid History: Hypertension 10/18/2021 N/A N/A Date Acquired: 56 N/A N/A Weeks of Treatment: Open N/A N/A Wound  Status: No N/A N/A Wound Recurrence: Yes N/A N/A Clustered Wound: 1 N/A N/A Clustered Quantity: 0.3x0.3x0.5 N/A N/A Measurements L x W x D (cm) 0.071 N/A N/A A (cm) : rea 0.035 N/A N/A Volume (cm) : 100.00% N/A N/A % Reduction in A rea: 100.00% N/A N/A % Reduction in Volume: 11 Position 1 (o'clock): 3.2 Maximum Distance 1 (cm): Yes N/A N/A Tunneling: Full Thickness Without Exposed N/A N/A Classification: Support Structures Medium N/A N/A Exudate Amount: Serosanguineous N/A N/A Exudate Type: red, brown N/A N/A Exudate Color: Distinct, outline attached N/A N/A Wound Margin: Large (67-100%) N/A N/A Granulation Amount: Red, Friable N/A N/A Granulation Quality: None Present (0%) N/A N/A Necrotic Amount: Fat Layer (Subcutaneous Tissue): Yes N/A N/A Exposed Structures: Fascia: No Tendon: No Muscle: No Joint: No Bone: No Large (67-100%) N/A N/A Epithelialization: Excoriation: No N/A N/A Periwound Skin Texture: Induration: No Callus: No Crepitus: No Rash: No Scarring: No Maceration: No N/A N/A Periwound Skin Moisture: Dry/Scaly: No Christine, [name] ([id number]) [id number] Page 5 of 9 Atrophie Blanche: No N/A N/A Periwound Skin Color: Cyanosis: No Ecchymosis: No Erythema: No Hemosiderin Staining: No Mottled:  No Pallor: No Rubor: No Treatment Notes Wound #1 (Lower Leg) Wound Laterality: Left, Lateral Cleanser Wound Cleanser Discharge Instruction: Cleanse the wound with wound cleanser prior to applying a clean dressing using gauze sponges, not tissue or cotton balls. Peri-Wound Care Topical Primary Dressing KerraCel Ag Gelling Fiber Dressing, 4x5 in (silver alginate) Discharge Instruction: Lightly pack into silver alginate to wound bed as instructed Secondary Dressing ABD Pad, 5x9 Discharge Instruction: Apply over primary dressing as directed. Woven Gauze Sponge, Non-Sterile 4x4 in Discharge Instruction: Apply over primary dressing as directed. Secured With [employer], 4.5x3.1 (in/yd) Discharge Instruction: Secure with Kerlix as directed. 68M Medipore H Soft Cloth Surgical T ape, 4 x 10 (in/yd) Discharge Instruction: Secure with tape as directed. Compression Wrap Compression Stockings Add-Ons Electronic Signature(s) Signed: 05/31/2022 4:32:14 PM By: Kalman Shan DO Entered By: Kalman Shan on 05/31/2022 14:58:21 -------------------------------------------------------------------------------- Multi-Disciplinary Care Plan Details Patient Name: Date of Service: [name], Christine Blinks [name] 05/31/2022 1:30 PM Medical Record Number: [id number] Patient Account Number: [id number] Date of Birth/Sex: Treating RN: 1951/02/18 (71 y.o. [name], [name] Primary Care Honora Searson: Deland Pretty Other Clinician: Referring Alexx Mcburney: Treating Caryn Gienger/Extender: Judie Grieve in Treatment: 69 Active Inactive Abuse / Safety / Falls / Self Care Management Nursing Diagnoses: History of Falls Potential for falls Goals: Christine, [name] ([id number]) [id number] Page 6 of 9 Patient/caregiver will verbalize/demonstrate measure taken to improve self care Date Initiated: 11/19/2021 Date Inactivated: 04/05/2022 Target Resolution Date:  04/21/2022 Goal Status: Met Patient/caregiver will verbalize/demonstrate measures taken to prevent injury and/or falls Date Initiated: 11/19/2021 Target Resolution Date: 06/17/2022 Goal Status: Active Interventions: Provide education on basic hygiene Provide education on fall prevention Provide education on HBO safety Provide education on vaccinations Treatment Activities: Education provided on Basic Hygiene : 05/17/2022 Notes: Pain, Acute or Chronic Nursing Diagnoses: Pain, acute or chronic: actual or potential Potential alteration in comfort, pain Goals: Patient will verbalize adequate pain control and receive pain control interventions during procedures as needed Date Initiated: 11/19/2021 Date Inactivated: 04/05/2022 Target Resolution Date: 04/22/2022 Goal Status: Met Patient/caregiver will verbalize comfort level met Date Initiated: 11/19/2021 Target Resolution Date: 06/17/2022 Goal Status: Active Interventions: Encourage patient to take pain medications as prescribed Provide education on pain management Reposition patient for comfort Treatment Activities: Administer pain control measures as ordered : 11/19/2021 Notes: Electronic Signature(s) Signed: 05/31/2022 6:12:04 PM  By: Deon Pilling RN, BSN Entered By: Deon Pilling on 05/31/2022 14:37:52 -------------------------------------------------------------------------------- Pain Assessment Details Patient Name: Date of Service: Christine Debar [name] 05/31/2022 1:30 PM Medical Record Number: [id number] Patient Account Number: [id number] Date of Birth/Sex: Treating RN: [birth year] (71 y.o. F) Primary Care Lillyen Schow: Deland Pretty Other Clinician: Referring Arihant Pennings: Treating [name]/Extender: Judie Grieve in Treatment: 27 Active Problems Location of Pain Severity and Description of Pain Patient Has Paino No Site Locations Christine, [name] ([id number]) [id number] Page 7  of 9 Pain Management and Medication Current Pain Management: Notes tender to touch Electronic Signature(s) Signed: 05/31/2022 2:19:52 PM By: Erenest Blank Entered By: Erenest Blank on 05/31/2022 13:32:03 -------------------------------------------------------------------------------- Patient/Caregiver Education Details Patient Name: Date of Service: [name] 10/10/2023andnbsp1:30 PM Medical Record Number: [id number] Patient Account Number: [id number] Date of Birth/Gender: Treating RN: [birth year] (71 y.o. [name] Primary Care Physician: Deland Pretty Other Clinician: Referring Physician: Treating Physician/Extender: Judie Grieve in Treatment: 27 Education Assessment Education Provided To: Patient Education Topics Provided Pain: Handouts: A Guide to Pain Control Methods: Explain/Verbal Responses: Reinforcements needed Electronic Signature(s) Signed: 05/31/2022 6:12:04 PM By: Deon Pilling RN, BSN Entered By: Deon Pilling on 05/31/2022 14:38:20 -------------------------------------------------------------------------------- Wound Assessment Details Patient Name: Date of Service: Christine Debar [name] 05/31/2022 1:30 PM [name] ([id number]) [id number] Page 8 of 9 Medical Record Number: [id number] Patient Account Number: [id number] Date of Birth/Sex: Treating RN: Oct 11, 1950 (71 y.o. F) Primary Care Hiilei Gerst: Deland Pretty Other Clinician: Referring Johniya Durfee: Treating Christine Barcellos/Extender: Judie Grieve in Treatment: 27 Wound Status Wound Number: 1 Primary Etiology: Trauma, Other Wound Location: Left, Lateral Lower Leg Wound Status: Open Wounding Event: Trauma Comorbid History: Congestive Heart Failure, Hypertension Date Acquired: 10/18/2021 Weeks Of Treatment: 27 Clustered Wound: Yes Photos Wound Measurements Length: (cm) Width: (cm) Depth: (cm) Clustered  Quantity: Area: (cm) Volume: (cm) 0.3 % Reduction in Area: 100% 0.3 % Reduction in Volume: 100% 0.5 Epithelialization: Large (67-100%) 1 Tunneling: Yes 0.071 Position (o'clock): 11 0.035 Maximum Distance: (cm) 3.2 Undermining: No Wound Description Classification: Full Thickness Without Exposed Sup Wound Margin: Distinct, outline attached Exudate Amount: Medium Exudate Type: Serosanguineous Exudate Color: red, brown port Structures Foul Odor After Cleansing: No Slough/Fibrino No Wound Bed Granulation Amount: Large (67-100%) Exposed Structure Granulation Quality: Red, Friable Fascia Exposed: No Necrotic Amount: None Present (0%) Fat Layer (Subcutaneous Tissue) Exposed: Yes Tendon Exposed: No Muscle Exposed: No Joint Exposed: No Bone Exposed: No Periwound Skin Texture Texture Color No Abnormalities Noted: No No Abnormalities Noted: No Callus: No Atrophie Blanche: No Crepitus: No Cyanosis: No Excoriation: No Ecchymosis: No Induration: No Erythema: No Rash: No Hemosiderin Staining: No Scarring: No Mottled: No Pallor: No Moisture Rubor: No No Abnormalities Noted: No Dry / Scaly: No Maceration: No Treatment Notes Wound #1 (Lower Leg) Wound Laterality: Left, Lateral Cleanser ANAH, BILLARD ([id number]) [id number] Page 9 of 9 Wound Cleanser Discharge Instruction: Cleanse the wound with wound cleanser prior to applying a clean dressing using gauze sponges, not tissue or cotton balls. Peri-Wound Care Topical Primary Dressing KerraCel Ag Gelling Fiber Dressing, 4x5 in (silver alginate) Discharge Instruction: Lightly pack into silver alginate to wound bed as instructed Secondary Dressing ABD Pad, 5x9 Discharge Instruction: Apply over primary dressing as directed. Woven Gauze Sponge, Non-Sterile 4x4 in Discharge Instruction: Apply over primary dressing as directed. Secured With [employer], 4.5x3.1 (in/yd) Discharge  Instruction: Secure with Kerlix as directed. 58M Medipore H Soft Cloth Surgical T ape,  4 x 10 (in/yd) Discharge Instruction: Secure with tape as directed. Compression Wrap Compression Stockings Add-Ons Electronic Signature(s) Signed: 05/31/2022 2:19:52 PM By: Erenest Blank Entered By: Erenest Blank on 05/31/2022 13:35:53 -------------------------------------------------------------------------------- Vitals Details Patient Name: Date of Service: [name], [name] 05/31/2022 1:30 PM Medical Record Number: [id number] Patient Account Number: [id number] Date of Birth/Sex: Treating RN: 11/04/1950 (71 y.o. F) Primary Care Vermon Grays: Deland Pretty Other Clinician: Referring Jaymian Bogart: Treating Banesa Tristan/Extender: Judie Grieve in Treatment: 27 Vital Signs Time Taken: 13:24 Temperature (F): 97.8 Height (in): 67 Pulse (bpm): 51 Weight (lbs): 340 Respiratory Rate (breaths/min): 18 Body Mass Index (BMI): 53.2 Blood Pressure (mmHg): 153/80 Reference Range: 80 - 120 mg / dl Electronic Signature(s) Signed: 05/31/2022 2:19:52 PM By: Erenest Blank Entered By: Erenest Blank on 05/31/2022 13:24:45

## 2022-05-31 NOTE — Progress Notes (Signed)
DESHANA, ROMINGER ([id number]) [id number] Page 1 of 9 Visit Report for 05/31/2022 Chief Complaint Document Details Patient Name: Date of Service: [name] 05/31/2022 1:30 PM Medical Record Number: [id number] Patient Account Number: [id number] Date of Birth/Sex: Treating RN: Aug 07, 1951 (71 y.o. F) Primary Care Provider: [name] Other Clinician: Referring Provider: Treating Provider/Extender: Annamary Rummage in Treatment: 27 Information Obtained from: Patient Chief Complaint 11/19/2021; Left lower extremity wound status post fall Electronic Signature(s) Signed: 05/31/2022 4:32:14 PM By: Geralyn Corwin DO Entered By: Geralyn Corwin on 05/31/2022 14:58:28 -------------------------------------------------------------------------------- HPI Details Patient Name: Date of Service: [name], [name]. 05/31/2022 1:30 PM Medical Record Number: [id number] Patient Account Number: [id number] Date of Birth/Sex: Treating RN: 20-May-1951 (71 y.o. F) Primary Care Provider: [name] Other Clinician: Referring Provider: Treating Provider/Extender: Annamary Rummage in Treatment: 27 History of Present Illness HPI Description: Admission 11/19/2021 [name] is a 71 year old female with a past medical history of paroxysmal A-fib on Eliquis, hypothyroidism, major depressive disorder, venous insufficiency and chronic diastolic heart failure that presents to the clinic for a 1 month history of nonhealing wound to the left lower extremity. She visited the ED on 10/18/2021 after a mechanical fall. She developed a hematoma that subsequently opened. She was hospitalized for 7 days and discharged on 10/25/2021. She has been on several different antibiotics For the past month. She states that most recently she was on Levaquin and linezolid for the past week. She states she completes her antibiotic course  tomorrow. She has been using Dakin's wet-to-dry dressings to the wound bed. She denies signs of infection. 4/7; patient presents for follow-up. She has been using Dakin's wet-to-dry dressings. She did end up going to the ED on 4/1 because she had excess bleeding with dressing change that she could not stop. She is on Eliquis for A-fib. In the ED they tied off a small artery. She has had no issues since discharge. She denies signs of infection. 4/14; this is a very difficult clinical situation. A patient with underlying chronic venous insufficiency and lymphedema very significant lower extremity edema had a hematoma after a fall on her left upper lateral lower leg. She is on Eliquis for atrial fibrillation apparently with a history of a splenic infarct following with Dr. Berton Mount of cardiology. She has exhibited significant bleeding from the wound surface including ao Venous bleeder that required suturing short while ago. She has been using Dakin's wet-to-dry packing and over the surface of the wound. She saw Dr. Graciela Husbands yesterday he is reluctant to consider stopping the Eliquis because of the prior history of presumed cardioembolism. Wants to communicate with Dr. Mikey Bussing when she returns. In a perfect world where she was not on Eliquis she requires a wound VAC with additional compression wraps but I understand the reluctance to do this because of the concerns of bleeding 4/28; the patient's wound actually looks better today using Dakin's wet-to-dry that she is changing twice a day she is wrapping this with Kerlix and Ace wrapping. She tells me she had 2 small bleeding areas which were part of the superficial wound that stopped this week with direct pressure. Other than that no major issues. In follow-up from discussion of last week Dr. 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 The bigger  question from my point of view is the wound VAC issue. As far as she knows and her daughter-in-law verifies that she has not had any bleeding from the deeper parts of the wound although the bleeding has been superficial including the one that sent her to the ER for stitches. She is concerned that a wound VAC would cause further bleeding and I cannot completely allay those concerns. [name], [name] ([id number]) [id number] Page 2 of 9 5/8; patient presents for follow-up. She has no issues or complaints today. She has been using Dakin's wet-to-dry dressings without issues. She denies signs of infection. 5/12; patient presents for follow-up. She continues to use Dakin's wet-to-dry dressings without issues. She denies signs of infection. She reports some issues with bleeding at times but this has improved. She denies signs of infection. 6/1; patient presents for follow-up. She was recently hospitalized for upper left leg thigh cellulitis. She was given IV cefepime and vancomycin and discharged on oral antibiotics. She has been using Dakin's wet-to-dry dressings to the left lower leg wound. She reports improvement in healing. She currently denies systemic signs of infection. 6/7; patient is using Dakin's wet-to-dry twice daily. In general this looks better than when I saw this a month or so ago however still considerable depth to the tunnel in her left leg. She is going for iron infusions ordered by her primary care doctor I believe 6/15; patient presents for follow-up. She has been using Dakin's wet-to-dry dressings. She has noted some scattered small areas that blister up and heal on her left lower leg. She has been using mupirocin ointment on them. Nothing open today. 6/23; patient's been using Dakin's wet-to-dry dressings to the tunneled wound and Hydrofera Blue to the opening. She has no issues or complaints  today. 6/29; patient presents for follow-up. She has been using Dakin's wet-to-dry dressings to the tunneled wound and Hydrofera Blue to the opening. She states that [facility] is sticking to the wound bed. She denies signs of infection. 7/7; patient presents for follow-up. She has been using Dakin's wet-to-dry dressings to the tunneled wound and PolyMem silver to the opening without issues. 7/13; patient presents for follow-up. She has been using PolyMem silver to the opening and the rope to the tunnel. She has no issues or complaints today. She denies signs of infection. 7/20; patient presents for follow-up. We have been using PolyMem silver to the wound bed. She has no issues or complaints today. She is receiving her custom compression garments tomorrow. 7/27; patient presents for follow-up. She has been using PolyMem silver to the wound bed. She is having her custom compression garments adjusted as these are not staying on very easily. 8/30; patient presents for follow-up. We have been using PolyMem silver to the wound bed. She is still waiting on her custom compression garments to be ordered. She denies signs of infection. 8/11; patient presents for follow-up. The wound VAC has been started and patient has been using this for the past week. Patient has home health who changes the wound VAC. She developed irritation to the periwound. DuoDERM was not being used to the periwound despite being ordered. 8/15; patient presents for follow-up. She restarted the wound VAC and has had improvement to the periwound with the use of DuoDERM. 8/24; patient presents for follow-up. She has been using the wound VAC. She has developed a wound just superior to the original wound. Likely as a result from the wound VAC. She denies signs of infection. 8/29; patient presents  for follow-up. We have been using collagen to the wound bed. She has held off on the wound VAC for the past week. 9/7; patient presents for  follow-up. We have been using iodoform packing to the wound bed. She has mild tenderness to the periwound. She denies systemic signs of infection. 9/12; patient presents for follow-up. Patient has been using Dakin's wet-to-dry dressings. She has no issues or complaints today. She denies signs of infection. 9/21; patient presents for follow-up. PuraPly #1 was placed in standard fashion at last clinic visit. She has no issues or complaints today. She denies signs of infection. 10/3; patient presents for follow-up. PuraPly #2 was placed in standard fashion at last clinic visit. She has no issues or complaints today. She denies signs of infection. 10/10; patient presents for follow-up. PuraPly #3 was placed in standard fashion at last clinic visit. She has no issues or complaints today. She denies signs of infection. Electronic Signature(s) Signed: 05/31/2022 4:32:14 PM By: Kalman Shan DO Entered By: Kalman Shan on 05/31/2022 14:58:50 -------------------------------------------------------------------------------- Physical Exam Details Patient Name: Date of Service: Christine Debar LYN [name]. 05/31/2022 1:30 PM Medical Record Number: [id number] Patient Account Number: [id number] Date of Birth/Sex: Treating RN: 1951/03/24 (71 y.o. F) Primary Care Provider: Deland Pretty Other Clinician: Referring Provider: Treating Provider/Extender: Judie Grieve in Treatment: 27 Constitutional respirations regular, non-labored and within target range for patient.. Cardiovascular 2+ dorsalis pedis/posterior tibialis pulses. [name], [name] ([id number]) [id number] Page 3 of 9 Psychiatric pleasant and cooperative. Notes Left lower extremity: Open wound with granulation tissue at the opening with a depth of 3 cm. No increased warmth, erythema or purulent drainage. No tenderness on palpation. Electronic Signature(s) Signed: 05/31/2022 4:32:14 PM By:  Kalman Shan DO Entered By: Kalman Shan on 05/31/2022 14:59:11 -------------------------------------------------------------------------------- Physician Orders Details Patient Name: Date of Service: [name], Christine Blinks LYN [name]. 05/31/2022 1:30 PM Medical Record Number: [id number] Patient Account Number: [id number] Date of Birth/Sex: Treating RN: 04-02-1951 (71 y.o. [name] Primary Care Provider: Deland Pretty Other Clinician: Referring Provider: Treating Provider/Extender: Judie Grieve in Treatment: 42 Verbal / Phone Orders: No Diagnosis Coding ICD-10 Coding Code Description [phone] Non-pressure chronic ulcer of other part of left lower leg with fat layer exposed T79.8XXA Other early complications of trauma, initial encounter I87.312 Chronic venous hypertension (idiopathic) with ulcer of left lower extremity I89.0 Lymphedema, not elsewhere classified I48.0 Paroxysmal atrial fibrillation Z79.01 Long term (current) use of anticoagulants Follow-up Appointments ppointment in 1 week. - Dr. Heber [place] and [place]ammi [place]li[place], Room 8 Return A ppointment in 2 weeks. - Dr. Heber [place] and [place], Room 8 Return A Anesthetic (In clinic) Topical Lidocaine 5% applied to wound bed Cellular or Tissue Based Products Wound #1 Left,Lateral Lower Leg Cellular or Tissue Based Product Type: - 05/12/2022 Puraply AM #1 applied. 05/17/22 PURAPLY AM # 2 05/24/2022 Puraply AM #3 05/31/2022 HOLD Bathing/ Shower/ Hygiene May shower with protection but do not get wound dressing(s) wet. Edema Control - Lymphedema / SCD / Other Elevate legs to the level of the heart or above for 30 minutes daily and/or when sitting, a frequency of: - 3-4 times a day throughout the day. Avoid standing for long periods of time. Moisturize legs daily. - both legs every night before bed. Home Health New wound care orders this week; continue Home Health for wound care. May utilize formulary equivalent dressing for  wound treatment orders unless otherwise specified. - Calcium alginate Ag lightly pack into wound bed. Change daily clean with  dakins. Other Home Health Orders/Instructions: - [facility] Wound Treatment Wound #1 - Lower Leg Wound Laterality: Left, Lateral Cleanser: Wound Cleanser (Home Health) 1 x Per Day/30 Days Discharge Instructions: Cleanse the wound with wound cleanser prior to applying a clean dressing using gauze sponges, not tissue or cotton balls. Prim Dressing: KerraCel Ag Gelling Fiber Dressing, 4x5 in (silver alginate) 1 x Per Day/30 Days ary Discharge Instructions: Lightly pack into silver alginate to wound bed as instructed FEVEN, ALDERFER ([id number]) [id number] Page 4 of 9 Secondary Dressing: ABD Pad, 5x9 1 x Per Day/30 Days Discharge Instructions: Apply over primary dressing as directed. Secondary Dressing: Woven Gauze Sponge, Non-Sterile 4x4 in (Home Health) 1 x Per Day/30 Days Discharge Instructions: Apply over primary dressing as directed. Secured With: [employer], 4.5x3.1 (in/yd) (Home Health) 1 x Per Day/30 Days Discharge Instructions: Secure with Kerlix as directed. Secured With: 76M Medipore H Soft Cloth Surgical T ape, 4 x 10 (in/yd) (Home Health) 1 x Per Day/30 Days Discharge Instructions: Secure with [name] as directed. Electronic Signature(s) Signed: 05/31/2022 4:32:14 PM By: Geralyn Corwin DO Entered By: Geralyn Corwin on 05/31/2022 14:59:18 -------------------------------------------------------------------------------- Problem List Details Patient Name: Date of Service: [name], Romona Curls LYN [name]. 05/31/2022 1:30 PM Medical Record Number: [id number] Patient Account Number: [id number] Date of Birth/Sex: Treating RN: Nov 29, 1950 (71 y.o. 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/31/2023 No Yes T79.8XXA Other early complications of trauma, initial encounter 11/19/2021 No Yes I87.312 Chronic venous hypertension (idiopathic) with ulcer of left lower extremity 04/28/2022 No Yes I89.0 Lymphedema, not elsewhere classified 04/28/2022 No Yes I48.0 Paroxysmal atrial fibrillation 11/19/2021 No Yes Z79.01 Long term (current) use of anticoagulants 11/19/2021 No Yes Inactive Problems Resolved Problems Electronic Signature(s) Signed: 05/31/2022 4:32:14 PM By: Geralyn Corwin DO Entered By: Geralyn Corwin on 05/31/2022 14:58:17 [name], [name] ([id number]) [id number] Page 5 of 9 -------------------------------------------------------------------------------- Progress Note Details Patient Name: Date of Service: [name] 05/31/2022 1:30 PM Medical Record Number: [id number] Patient Account Number: [id number] Date of Birth/Sex: Treating RN: 1951/03/16 (71 y.o. F) Primary Care Provider: [name] Other Clinician: Referring Provider: Treating Provider/Extender: Annamary Rummage in Treatment: 27 Subjective Chief Complaint Information obtained from Patient 11/19/2021; Left lower extremity wound status post fall History of Present Illness (HPI) Admission 11/19/2021 Ms. Conna Terada is a 71 year old female with a past medical history of paroxysmal A-fib on Eliquis, hypothyroidism, major depressive disorder, venous insufficiency and chronic diastolic heart failure that presents to the clinic for a 1 month history of nonhealing wound to the left lower extremity. She visited the ED on 10/18/2021 after a mechanical fall. She developed a hematoma that subsequently opened. She was hospitalized for 7 days and discharged on 10/25/2021. She has been on several  different antibiotics For the past month. She states that most recently she was on Levaquin and linezolid for the past week. She states she completes her antibiotic course tomorrow. She has been using Dakin's wet-to-dry dressings to the wound bed. She denies signs of infection. 4/7; patient presents for follow-up. She has been using Dakin's wet-to-dry dressings. She did end up going to the ED on 4/1 because she had excess bleeding with dressing change that she could not stop. She is on Eliquis for A-fib. In  the ED they tied off a small artery. She has had no issues since discharge. She denies signs of infection. 4/14; this is a very difficult clinical situation. A patient with underlying chronic venous insufficiency and lymphedema very significant lower extremity edema had a hematoma after a fall on her left upper lateral lower leg. She is on Eliquis for atrial fibrillation apparently with a history of a splenic infarct following with Dr. Berton MountSteve Klein of cardiology. She has exhibited significant bleeding from the wound surface including ao Venous bleeder that required suturing short while ago. She has been using Dakin's wet-to-dry packing and over the surface of the wound. She saw Dr. Graciela HusbandsKlein yesterday he is reluctant to consider stopping the Eliquis because of the prior history of presumed cardioembolism. Wants to communicate with Dr. Mikey BussingHoffman when she returns. In a perfect world where she was not on Eliquis she requires a wound VAC with additional compression wraps but I understand the reluctance to do this because of the concerns of bleeding 4/28; the patient's wound actually looks better today using Dakin's wet-to-dry that she is changing twice a day she is wrapping this with Kerlix and Ace wrapping. She tells me she had 2 small bleeding areas which were part of the superficial wound that stopped this week with direct pressure. Other than that no major issues. In follow-up from discussion of last week  Dr. 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 The bigger question from my point of view is the wound VAC issue. As far as she knows and her daughter-in-law verifies that she has not had any bleeding from the deeper parts of the wound although the bleeding has been superficial including the one that sent her to the ER for stitches. She is concerned that a wound VAC would cause further bleeding and I cannot completely allay those concerns. 5/8; patient presents for follow-up. She has no issues or complaints today. She has been using Dakin's wet-to-dry dressings without issues. She denies signs of infection. 5/12; patient presents for follow-up. She continues to use Dakin's wet-to-dry dressings without issues. She denies signs of infection. She reports some issues with bleeding at times but this has improved. She denies signs of infection. 6/1; patient presents for follow-up. She was recently hospitalized for upper left leg thigh cellulitis. She was given IV cefepime and vancomycin and discharged on oral antibiotics. She has been using Dakin's wet-to-dry dressings to the left lower leg wound. She reports improvement in healing. She currently denies systemic signs of infection. 6/7; patient is using Dakin's wet-to-dry twice daily. In general this looks better than when I saw this a month or so ago however still considerable depth to the tunnel in her left leg. She is going for iron infusions ordered by her primary care doctor I believe 6/15; patient presents for follow-up. She has been using Dakin's wet-to-dry dressings. She has noted some scattered small areas that blister up and heal on her left lower leg. She has been using mupirocin ointment on them. Nothing open today. 6/23; patient's been using Dakin's wet-to-dry dressings to the  tunneled wound and Hydrofera Blue to the opening. She has no issues or complaints today. 6/29; patient presents for follow-up. She has been using Dakin's wet-to-dry dressings to the tunneled wound and Hydrofera Blue to the opening. [facility]. She denies signs of infection. 7/7; patient presents for follow-up. She has been using Dakin's wet-to-dry dressings to the tunneled wound and PolyMem silver to the opening without issues. 7/13; patient presents for follow-up. She has been using PolyMem silver to the opening and the rope to the tunnel. She has no issues or complaints today. She denies signs of infection. 7/20; patient presents for follow-up. We have been using PolyMem silver to the wound bed. She has no issues or complaints today. She is receiving her custom compression garments tomorrow. 7/27; patient presents for follow-up. She has been using PolyMem silver to the wound bed. She is having her custom compression garments adjusted as these are not staying on very easily. [name], [name] ([id number]) [id number] Page 6 of 9 8/30; patient presents for follow-up. We have been using PolyMem silver to the wound bed. She is still waiting on her custom compression garments to be ordered. She denies signs of infection. 8/11; patient presents for follow-up. The wound VAC has been started and patient has been using this for the past week. Patient has home health who changes the wound VAC. She developed irritation to the periwound. DuoDERM was not being used to the periwound despite being ordered. 8/15; patient presents for follow-up. She restarted the wound VAC and has had improvement to the periwound with the use of DuoDERM. 8/24; patient presents for follow-up. She has been using the wound VAC. She has developed a wound just superior to the original wound. Likely as a result from the wound VAC. She denies signs of  infection. 8/29; patient presents for follow-up. We have been using collagen to the wound bed. She has held off on the wound VAC for the past week. 9/7; patient presents for follow-up. We have been using iodoform packing to the wound bed. She has mild tenderness to the periwound. She denies systemic signs of infection. 9/12; patient presents for follow-up. Patient has been using Dakin's wet-to-dry dressings. She has no issues or complaints today. She denies signs of infection. 9/21; patient presents for follow-up. PuraPly #1 was placed in standard fashion at last clinic visit. She has no issues or complaints today. She denies signs of infection. 10/3; patient presents for follow-up. PuraPly #2 was placed in standard fashion at last clinic visit. She has no issues or complaints today. She denies signs of infection. 10/10; patient presents for follow-up. PuraPly #3 was placed in standard fashion at last clinic visit. She has no issues or complaints today. She denies signs of infection. Patient History Medical History Cardiovascular Patient has history of Congestive Heart Failure, Hypertension Hospitalization/Surgery History - Cellulitis left leg- 01/03/2022-01/07/2022. Medical A Surgical History Notes nd Constitutional Symptoms (General Health) Infarction of spleen Hematologic/Lymphatic Hypothyroidism Cardiovascular A-Fib Gastrointestinal Gastroesophageal reflux Genitourinary Chronic kidney disease Musculoskeletal Osteoarthritis of knee Psychiatric Anxiety Objective Constitutional respirations regular, non-labored and within target range for patient.. Vitals Time Taken: 1:24 PM, Height: 67 in, Weight: 340 lbs, BMI: 53.2, Temperature: 97.8 F, Pulse: 51 bpm, Respiratory Rate: 18 breaths/min, Blood Pressure: 153/80 mmHg. Cardiovascular 2+ dorsalis pedis/posterior tibialis pulses. Psychiatric pleasant and cooperative. General Notes: Left lower extremity: Open wound with granulation  tissue at the opening with a depth of 3 cm. No increased warmth, erythema or purulent drainage. No tenderness on palpation. Integumentary (Hair, Skin) Wound #1 status is Open. Original cause of wound was Trauma. The date acquired was: 10/18/2021. The wound has been in treatment 27 weeks. The wound is located on the Left,Lateral  Lower Leg. The wound measures 0.3cm length x 0.3cm width x 0.5cm depth; 0.071cm^2 area and 0.035cm^3 volume. There is Fat Layer (Subcutaneous Tissue) exposed. There is no undermining noted, however, there is tunneling at 11:00 with a maximum distance of 3.2cm. There is a medium amount of serosanguineous drainage noted. The wound margin is distinct with the outline attached to the wound base. There is large (67-100%) red, friable granulation within the wound bed. There is no necrotic tissue within the wound bed. The periwound skin appearance did not exhibit: Callus, Crepitus, Excoriation, Induration, Rash, Scarring, Dry/Scaly, Maceration, Atrophie Blanche, Cyanosis, Ecchymosis, Hemosiderin Staining, Mottled, Pallor, Rubor, Erythema. [name], [name] ([id number]) [id number] Page 7 of 9 Assessment Active Problems ICD-10 Non-pressure chronic ulcer of other part of left lower leg with fat layer exposed Other early complications of trauma, initial encounter Chronic venous hypertension (idiopathic) with ulcer of left lower extremity Lymphedema, not elsewhere classified Paroxysmal atrial fibrillation Long term (current) use of anticoagulants Patient's wound is stable. Not much improvement with PuraPly. I recommended holding this and switching to silver alginate. Follow-up in 1 week. Plan Follow-up Appointments: Return Appointment in 1 week. - Dr. Mikey Bussing and Yvonne Kendall, Room 8 Return Appointment in 2 weeks. - Dr. Mikey Bussing and Yvonne Kendall, Room 8 Anesthetic: (In clinic) Topical Lidocaine 5% applied to wound bed Cellular or Tissue Based Products: Wound #1  Left,Lateral Lower Leg: Cellular or Tissue Based Product Type: - 05/12/2022 Puraply AM #1 applied. 05/17/22 PURAPLY AM # 2 05/24/2022 Puraply AM #3 05/31/2022 HOLD Bathing/ Shower/ Hygiene: May shower with protection but do not get wound dressing(s) wet. Edema Control - Lymphedema / SCD / Other: Elevate legs to the level of the heart or above for 30 minutes daily and/or when sitting, a frequency of: - 3-4 times a day throughout the day. Avoid standing for long periods of time. Moisturize legs daily. - both legs every night before bed. Home Health: New wound care orders this week; continue Home Health for wound care. May utilize formulary equivalent dressing for wound treatment orders unless otherwise specified. - Calcium alginate Ag lightly pack into wound bed. Change daily clean with dakins. Other Home Health Orders/Instructions: - [facility] WOUND #1: - Lower Leg Wound Laterality: Left, Lateral Cleanser: Wound Cleanser (Home Health) 1 x Per Day/30 Days Discharge Instructions: Cleanse the wound with wound cleanser prior to applying a clean dressing using gauze sponges, not tissue or cotton balls. Prim Dressing: KerraCel Ag Gelling Fiber Dressing, 4x5 in (silver alginate) 1 x Per Day/30 Days ary Discharge Instructions: Lightly pack into silver alginate to wound bed as instructed Secondary Dressing: ABD Pad, 5x9 1 x Per Day/30 Days Discharge Instructions: Apply over primary dressing as directed. Secondary Dressing: Woven Gauze Sponge, Non-Sterile 4x4 in (Home Health) 1 x Per Day/30 Days Discharge Instructions: Apply over primary dressing as directed. Secured With: [employer], 4.5x3.1 (in/yd) (Home Health) 1 x Per Day/30 Days Discharge Instructions: Secure with Kerlix as directed. Secured With: Christine Medipore H Soft Cloth Surgical T ape, 4 x 10 (in/yd) (Home Health) 1 x Per Day/30 Days Discharge Instructions: Secure with [name] as directed. 1. Silver alginate 2. Follow-up in 1  week Electronic Signature(s) Signed: 05/31/2022 4:32:14 PM By: Geralyn Corwin DO Entered By: Geralyn Corwin on 05/31/2022 14:59:57 -------------------------------------------------------------------------------- HxROS Details Patient Name: Date of Service: [name], [name]. 05/31/2022 1:30 PM Medical Record Number: [id number] Patient Account Number: [id number] Date of Birth/Sex: Treating RN: 1951/07/13 (71 y.o. F) Primary Care Provider: [name] Other Clinician: Referring  Provider: Treating Provider/Extender: Annamary Rummage in Treatment: [address] [name] ([id number]) [id number] Page 8 of 9 Constitutional Symptoms (General Health) Medical History: Past Medical History Notes: Infarction of spleen Hematologic/Lymphatic Medical History: Past Medical History Notes: Hypothyroidism Cardiovascular Medical History: Positive for: Congestive Heart Failure; Hypertension Past Medical History Notes: A-Fib Gastrointestinal Medical History: Past Medical History Notes: Gastroesophageal reflux Genitourinary Medical History: Past Medical History Notes: Chronic kidney disease Musculoskeletal Medical History: Past Medical History Notes: Osteoarthritis of knee Psychiatric Medical History: Past Medical History Notes: Anxiety Immunizations Pneumococcal Vaccine: Received Pneumococcal Vaccination: No Implantable Devices No devices added Hospitalization / Surgery History Type of Hospitalization/Surgery Cellulitis left leg- 01/03/2022-01/07/2022 Electronic Signature(s) Signed: 05/31/2022 4:32:14 PM By: Geralyn Corwin DO Entered By: Geralyn Corwin on 05/31/2022 14:58:55 -------------------------------------------------------------------------------- SuperBill Details Patient Name: Date of Service: [name]. 05/31/2022 Medical Record Number: [id number] Patient Account Number: [id number] Date of Birth/Sex:  Treating RN: 19-Mar-1951 (71 y.o. [name] Primary Care Provider: [name] Other Clinician: Referring Provider: Treating Provider/Extender: Annamary Rummage in Treatment: [address] Ladonne [name] ([id number]) [id number] Page 9 of 9 Diagnosis Coding ICD-10 Codes Code Description [phone] Non-pressure chronic ulcer of other part of left lower leg with fat layer exposed T79.8XXA Other early complications of trauma, initial encounter I87.312 Chronic venous hypertension (idiopathic) with ulcer of left lower extremity I89.0 Lymphedema, not elsewhere classified I48.0 Paroxysmal atrial fibrillation Z79.01 Long term (current) use of anticoagulants Facility Procedures : CPT4 Code: [id number] Description: 99213 - WOUND CARE VISIT-LEV 3 EST PT Modifier: Quantity: 1 Physician Procedures : CPT4 Code Description Modifier [id number] 99213 - WC PHYS LEVEL 3 - EST PT ICD-10 Diagnosis Description L97.822 Non-pressure chronic ulcer of other part of left lower leg with fat layer exposed T79.8XXA Other early complications of trauma, initial  encounter I87.312 Chronic venous hypertension (idiopathic) with ulcer of left lower extremity I89.0 Lymphedema, not elsewhere classified Quantity: 1 Electronic Signature(s) Signed: 05/31/2022 4:32:14 PM By: Geralyn Corwin DO Entered By: Geralyn Corwin on 05/31/2022 15:00:17

## 2022-06-01 DIAGNOSIS — I10 Essential (primary) hypertension: Secondary | ICD-10-CM | POA: Diagnosis not present

## 2022-06-01 DIAGNOSIS — E7800 Pure hypercholesterolemia, unspecified: Secondary | ICD-10-CM | POA: Diagnosis not present

## 2022-06-01 DIAGNOSIS — Z0000 Encounter for general adult medical examination without abnormal findings: Secondary | ICD-10-CM | POA: Diagnosis not present

## 2022-06-01 DIAGNOSIS — E039 Hypothyroidism, unspecified: Secondary | ICD-10-CM | POA: Diagnosis not present

## 2022-06-01 DIAGNOSIS — E559 Vitamin D deficiency, unspecified: Secondary | ICD-10-CM | POA: Diagnosis not present

## 2022-06-07 ENCOUNTER — Encounter (HOSPITAL_BASED_OUTPATIENT_CLINIC_OR_DEPARTMENT_OTHER): Payer: PPO | Admitting: Internal Medicine

## 2022-06-07 DIAGNOSIS — I890 Lymphedema, not elsewhere classified: Secondary | ICD-10-CM | POA: Diagnosis not present

## 2022-06-07 DIAGNOSIS — T798XXA Other early complications of trauma, initial encounter: Secondary | ICD-10-CM | POA: Diagnosis not present

## 2022-06-07 DIAGNOSIS — I87312 Chronic venous hypertension (idiopathic) with ulcer of left lower extremity: Secondary | ICD-10-CM | POA: Diagnosis not present

## 2022-06-07 DIAGNOSIS — L97822 Non-pressure chronic ulcer of other part of left lower leg with fat layer exposed: Secondary | ICD-10-CM

## 2022-06-07 NOTE — Progress Notes (Signed)
ALEKSA, COLLINSWORTH ([id number]) [id number] Page 1 of 9 Visit Report for 06/07/2022 Arrival Information Details Patient Name: Date of Service: [name] 06/07/2022 12:45 PM Medical Record Number: [id number] Patient Account Number: [id number] Date of Birth/Sex: Treating RN: Jun 19, 1951 (71 y.o. [name] Primary Care Samaa Ueda: Deland Pretty Other Clinician: Referring Nianna Igo: Treating Sharisse Rantz/Extender: Judie Grieve in Treatment: 28 Visit Information History Since Last Visit Added or deleted any medications: No Patient Arrived: [name] Any new allergies or adverse reactions: No Arrival Time: 12:34 Had a fall or experienced change in No Accompanied By: son activities of daily living that may affect Transfer Assistance: None risk of falls: Patient Identification Verified: Yes Signs or symptoms of abuse/neglect since last visito No Secondary Verification Process Completed: Yes Hospitalized since last visit: No Patient Requires Transmission-Based Precautions: No Implantable device outside of the clinic excluding No Patient Has Alerts: Yes cellular tissue based products placed in the center Patient Alerts: Patient on Blood Thinner since last visit: Has Dressing in Place as Prescribed: Yes Has Compression in Place as Prescribed: Yes Pain Present Now: No Electronic Signature(s) Signed: 06/07/2022 5:18:48 PM By: Sharyn Creamer RN, BSN Entered By: Sharyn Creamer on 06/07/2022 12:42:38 -------------------------------------------------------------------------------- Clinic Level of Care Assessment Details Patient Name: Date of Service: [name] 06/07/2022 12:45 PM Medical Record Number: [id number] Patient Account Number: [id number] Date of Birth/Sex: Treating RN: March 19, 1951 (71 y.o. [name] Primary Care Kerensa Nicklas: Deland Pretty Other Clinician: Referring Jadrien Narine: Treating Corene Resnick/Extender:  Judie Grieve in Treatment: 28 Clinic Level of Care Assessment Items TOOL 4 Quantity Score X- 1 0 Use when only an EandM is performed on FOLLOW-UP visit ASSESSMENTS - Nursing Assessment / Reassessment X- 1 10 Reassessment of Co-morbidities (includes updates in patient status) X- 1 5 Reassessment of Adherence to Treatment Plan ASSESSMENTS - Wound and Skin A ssessment / Reassessment X - Simple Wound Assessment / Reassessment - one wound 1 5 _0  - 0 Complex Wound Assessment / Reassessment - multiple wounds _1  - 0 Dermatologic / Skin Assessment (not related to wound area) ASSESSMENTS - Focused Assessment X- 1 5 Circumferential Edema Measurements - multi extremities _2  - 0 Nutritional Assessment / Counseling / Intervention Christine, [name] ([id number]) [id number] Page 2 of 9 _3  - 0 Lower Extremity Assessment (monofilament, tuning fork, pulses) _4  - 0 Peripheral Arterial Disease Assessment (using hand held doppler) ASSESSMENTS - Ostomy and/or Continence Assessment and Care _5  - 0 Incontinence Assessment and Management _6  - 0 Ostomy Care Assessment and Management (repouching, etc.) PROCESS - Coordination of Care X - Simple Patient / Family Education for ongoing care 1 15 _7  - 0 Complex (extensive) Patient / Family Education for ongoing care X- 1 10 Staff obtains Programmer, systems, Records, T Results / Process Orders est _8  - 0 Staff telephones HHA, Nursing Homes / Clarify orders / etc _9  - 0 Routine Transfer to another Facility (non-emergent condition) _10  - 0 Routine Hospital Admission (non-emergent condition) _11  - 0 New Admissions / Biomedical engineer / Ordering NPWT Apligraf, etc. , _12  - 0 Emergency Hospital Admission (emergent condition) _13  - 0 Simple Discharge Coordination _14  - 0 Complex (extensive) Discharge Coordination PROCESS - Special Needs _15  - 0 Pediatric / Minor Patient Management _16  - 0 Isolation Patient  Management _17  - 0 Hearing / Language / Visual special needs _18  - 0 Assessment of Community assistance (transportation, D/C planning, etc.) _19  - 0 Additional assistance / Altered mentation _20  - 0 Support Surface(s) Assessment (  bed, cushion, seat, etc.) INTERVENTIONS - Wound Cleansing / Measurement X - Simple Wound Cleansing - one wound 1 5 _0  - 0 Complex Wound Cleansing - multiple wounds X- 1 5 Wound Imaging (photographs - any number of wounds) _1  - 0 Wound Tracing (instead of photographs) X- 1 5 Simple Wound Measurement - one wound _2  - 0 Complex Wound Measurement - multiple wounds INTERVENTIONS - Wound Dressings _3  - 0 Small Wound Dressing one or multiple wounds X- 1 15 Medium Wound Dressing one or multiple wounds _4  - 0 Large Wound Dressing one or multiple wounds <PRFFMBWGYKZLDJTT>_0<\/VXBLTJQZESPQZRAQ>_7  - 0 Application of Medications - topical <MAUQJFHLKTGYBWLS>_9<\/HTDSKAJGOTLXBWIO>_0  - 0 Application of Medications - injection INTERVENTIONS - Miscellaneous _7  - 0 External ear exam _8  - 0 Specimen Collection (cultures, biopsies, blood, body fluids, etc.) _9  - 0 Specimen(s) / Culture(s) sent or taken to Lab for analysis _10  - 0 Patient Transfer (multiple staff / Civil Service fast streamer / Similar devices) _11  - 0 Simple Staple / Suture removal (25 or less) _12  - 0 Complex Staple / Suture removal (26 or more) _13  - 0 Hypo / Hyperglycemic Management (close monitor of Blood Glucose) Christine, [name] ([id number]) [id number] Page 3 of 9 _14  - 0 Ankle / Brachial Index (ABI) - do not check if billed separately X- 1 5 Vital Signs Has the patient been seen at the hospital within the last three years: Yes Total Score: 85 Level Of Care: New/Established - Level 3 Electronic Signature(s) Signed: 06/07/2022 5:18:48 PM By: Sharyn Creamer RN, BSN Entered By: Sharyn Creamer on 06/07/2022 17:05:32 -------------------------------------------------------------------------------- Encounter Discharge Information Details Patient Name: Date of  Service: [name]. 06/07/2022 12:45 PM Medical Record Number: [id number] Patient Account Number: [id number] Date of Birth/Sex: Treating RN: 02-08-1951 (71 y.o. [name] Primary Care Dion Parrow: Deland Pretty Other Clinician: Referring Keshun Berrett: Treating Marcha Licklider/Extender: Judie Grieve in Treatment: 28 Encounter Discharge Information Items Discharge Condition: Stable Ambulatory Status: Walker Discharge Destination: Home Transportation: Private Auto Accompanied By: son Schedule Follow-up Appointment: Yes Clinical Summary of Care: Patient Declined Electronic Signature(s) Signed: 06/07/2022 5:18:48 PM By: Sharyn Creamer RN, BSN Entered By: Sharyn Creamer on 06/07/2022 17:04:34 -------------------------------------------------------------------------------- Lower Extremity Assessment Details Patient Name: Date of Service: [name]. 06/07/2022 12:45 PM Medical Record Number: [id number] Patient Account Number: [id number] Date of Birth/Sex: Treating RN: 1951/05/27 (71 y.o. [name] Primary Care Desyre Calma: Deland Pretty Other Clinician: Referring Amar Keenum: Treating Juanna Pudlo/Extender: Judie Grieve in Treatment: 28 Edema Assessment Assessed: [name]: No] Patrice Paradise: No] [Left: Edema] [Right: :] Calf Left: Right: Point of Measurement: 31 cm From Medial Instep 47 cm Ankle Left: Right: Point of Measurement: 9 cm From Medial Instep 25 cm Electronic Signature(s) ESME, FREUND ([id number]) [id number] Page 4 of 9 Signed: 06/07/2022 5:18:48 PM By: Sharyn Creamer RN, BSN Entered By: Sharyn Creamer on 06/07/2022 12:46:53 -------------------------------------------------------------------------------- Multi Wound Chart Details Patient Name: Date of Service: [name]. 06/07/2022 12:45 PM Medical Record Number: [id number] Patient Account Number: [id number] Date of  Birth/Sex: Treating RN: 09-Apr-1951 (71 y.o. F) Primary Care Emree Locicero: Deland Pretty Other Clinician: Referring Rhianon Zabawa: Treating Mixtli Reno/Extender: Judie Grieve in Treatment: 28 Vital Signs Height(in): 67 Pulse(bpm): 54 Weight(lbs): 340 Blood Pressure(mmHg): 160/90 Body Mass Index(BMI): 53.2 Temperature(F): 97.9 Respiratory Rate(breaths/min): 18 [1:Photos:] [N/A:N/A] Left, Lateral Lower Leg N/A N/A Wound Location: Trauma N/A N/A Wounding Event: Trauma, Other N/A N/A Primary Etiology: Congestive Heart Failure, N/A N/A Comorbid History: Hypertension 10/18/2021 N/A N/A Date Acquired:  28 N/A N/A Weeks of Treatment: Open N/A N/A Wound Status: No N/A N/A Wound Recurrence: Yes N/A N/A Clustered Wound: 1 N/A N/A Clustered Quantity: 0.7x0.7x0.5 N/A N/A Measurements L x W x D (cm) 0.385 N/A N/A A (cm) : rea 0.192 N/A N/A Volume (cm) : 99.80% N/A N/A % Reduction in A rea: 100.00% N/A N/A % Reduction in Volume: 12 Position 1 (o'clock): 4.3 Maximum Distance 1 (cm): Yes N/A N/A Tunneling: Full Thickness Without Exposed N/A N/A Classification: Support Structures Medium N/A N/A Exudate Amount: Serosanguineous N/A N/A Exudate Type: red, brown N/A N/A Exudate Color: Distinct, outline attached N/A N/A Wound Margin: Large (67-100%) N/A N/A Granulation Amount: Red, Friable N/A N/A Granulation Quality: None Present (0%) N/A N/A Necrotic Amount: Fat Layer (Subcutaneous Tissue): Yes N/A N/A Exposed Structures: Fascia: No Tendon: No Muscle: No Joint: No Bone: No Large (67-100%) N/A N/A Epithelialization: Induration: Yes N/A N/A Periwound Skin Texture: Excoriation: No Callus: No Crepitus: No Rash: No Scarring: No Maceration: No N/A N/A [address]Christine, [name] ([id number]) [id number] Page 5 of 9 Dry/Scaly: No Hemosiderin Staining: Yes N/A N/A Periwound Skin Color: Atrophie  Blanche: No Cyanosis: No Ecchymosis: No Erythema: No Mottled: No Pallor: No Rubor: No No Abnormality N/A N/A Temperature: Treatment Notes Electronic Signature(s) Signed: 06/07/2022 4:33:46 PM By: Kalman Shan DO Entered By: Kalman Shan on 06/07/2022 13:14:49 -------------------------------------------------------------------------------- Multi-Disciplinary Care Plan Details Patient Name: Date of Service: [name]. 06/07/2022 12:45 PM Medical Record Number: [id number] Patient Account Number: [id number] Date of Birth/Sex: Treating RN: 1950/11/25 (71 y.o. [name] Primary Care Merwyn Hodapp: Deland Pretty Other Clinician: Referring Brendon Christoffel: Treating Suriya Kovarik/Extender: Judie Grieve in Treatment: 28 Active Inactive Abuse / Safety / Falls / Self Care Management Nursing Diagnoses: History of Falls Potential for falls Goals: Patient/caregiver will verbalize/demonstrate measure taken to improve self care Date Initiated: 11/19/2021 Date Inactivated: 04/05/2022 Target Resolution Date: 04/21/2022 Goal Status: Met Patient/caregiver will verbalize/demonstrate measures taken to prevent injury and/or falls Date Initiated: 11/19/2021 Target Resolution Date: 06/17/2022 Goal Status: Active Interventions: Provide education on basic hygiene Provide education on fall prevention Provide education on HBO safety Provide education on vaccinations Treatment Activities: Education provided on Basic Hygiene : 05/17/2022 Notes: Pain, Acute or Chronic Nursing Diagnoses: Pain, acute or chronic: actual or potential Potential alteration in comfort, pain Goals: Patient will verbalize adequate pain control and receive pain control interventions during procedures as needed Date Initiated: 11/19/2021 Date Inactivated: 04/05/2022 Target Resolution Date: 04/22/2022 Goal Status: Met Patient/caregiver will verbalize comfort level met Date Initiated:  11/19/2021 Target Resolution Date: 06/17/2022 Goal Status: Active Christine, [name] ([id number]) [id number] Page 6 of 9 Interventions: Encourage patient to take pain medications as prescribed Provide education on pain management Reposition patient for comfort Treatment Activities: Administer pain control measures as ordered : 11/19/2021 Notes: Electronic Signature(s) Signed: 06/07/2022 5:18:48 PM By: Sharyn Creamer RN, BSN Entered By: Sharyn Creamer on 06/07/2022 13:13:10 -------------------------------------------------------------------------------- Pain Assessment Details Patient Name: Date of Service: [name]. 06/07/2022 12:45 PM Medical Record Number: [id number] Patient Account Number: [id number] Date of Birth/Sex: Treating RN: 09-Jan-1951 (71 y.o. [name] Primary Care Kainan Patty: Deland Pretty Other Clinician: Referring Perley Arthurs: Treating Lucan Riner/Extender: Judie Grieve in Treatment: 28 Active Problems Location of Pain Severity and Description of Pain Patient Has Paino No Site Locations Pain Management and Medication Current Pain Management: Electronic Signature(s) Signed: 06/07/2022 5:18:48 PM By: Sharyn Creamer RN, BSN Entered By: Sharyn Creamer on 06/07/2022 12:43:12 --------------------------------------------------------------------------------  Patient/Caregiver Education Details Patient Name: Date of Service: [name] 10/17/2023andnbsp12:45 PM Medical Record Number: [id number] Patient Account Number: [id number] Christine, [name] ([id number]) [id number] Page 7 of 9 Date of Birth/Gender: Treating RN: [birth year] (71 y.o. [name] Primary Care Physician: Deland Pretty Other Clinician: Referring Physician: Treating Physician/Extender: Judie Grieve in Treatment: 28 Education Assessment Education Provided  To: Patient Education Topics Provided Wound/Skin Impairment: Methods: Explain/Verbal Responses: State content correctly [employer]) Signed: 06/07/2022 5:18:48 PM By: Sharyn Creamer RN, BSN Entered By: Sharyn Creamer on 06/07/2022 13:13:41 -------------------------------------------------------------------------------- Wound Assessment Details Patient Name: Date of Service: [name]. 06/07/2022 12:45 PM Medical Record Number: [id number] Patient Account Number: [id number] Date of Birth/Sex: Treating RN: 09-Jul-1951 (71 y.o. [name] Primary Care Larue Lightner: Deland Pretty Other Clinician: Referring Dennison Mcdaid: Treating Atlee Villers/Extender: Judie Grieve in Treatment: 28 Wound Status Wound Number: 1 Primary Etiology: Trauma, Other Wound Location: Left, Lateral Lower Leg Wound Status: Open Wounding Event: Trauma Comorbid History: Congestive Heart Failure, Hypertension Date Acquired: 10/18/2021 Weeks Of Treatment: 28 Clustered Wound: Yes Photos Wound Measurements Length: (cm) Width: (cm) Depth: (cm) Clustered Quantity: Area: (cm) Volume: (cm) 0.7 % Reduction in Area: 99.8% 0.7 % Reduction in Volume: 100% 0.5 Epithelialization: Large (67-100%) 1 Tunneling: Yes 0.385 Position (o'clock): 12 0.192 Maximum Distance: (cm) 4.3 Undermining: No Wound Description Classification: Full Thickness Without Exposed Support Structures Wound Margin: Distinct, outline attached Christine, [name] ([id number]) Exudate Amount: Medium Exudate Type: Serosanguineous Exudate Color: red, brown Foul Odor After Cleansing: No Slough/Fibrino No [id number] Page 8 of 9 Wound Bed Granulation Amount: Large (67-100%) Exposed Structure Granulation Quality: Red, Friable Fascia Exposed: No Necrotic Amount: None Present (0%) Fat Layer (Subcutaneous Tissue) Exposed: Yes Tendon Exposed: No Muscle Exposed: No Joint Exposed:  No Bone Exposed: No Periwound Skin Texture Texture Color No Abnormalities Noted: No No Abnormalities Noted: No Callus: No Atrophie Blanche: No Crepitus: No Cyanosis: No Excoriation: No Ecchymosis: No Induration: Yes Erythema: No Rash: No Hemosiderin Staining: Yes Scarring: No Mottled: No Pallor: No Moisture Rubor: No No Abnormalities Noted: No Dry / Scaly: No Temperature / Pain Maceration: No Temperature: No Abnormality Treatment Notes Wound #1 (Lower Leg) Wound Laterality: Left, Lateral Cleanser Wound Cleanser Discharge Instruction: Cleanse the wound with wound cleanser prior to applying a clean dressing using gauze sponges, not tissue or cotton balls. Peri-Wound Care Topical Primary Dressing Plain packing strip 1/2 (in) Discharge Instruction: Lightly pack as instructed Dakin's Solution 0.125%, 16 (oz) Discharge Instruction: Moisten gauze with Dakin's solution Secondary Dressing ABD Pad, 5x9 Discharge Instruction: Apply over primary dressing as directed. Woven Gauze Sponge, Non-Sterile 4x4 in Discharge Instruction: Apply over primary dressing as directed. Secured With [employer], 4.5x3.1 (in/yd) Discharge Instruction: Secure with Kerlix as directed. 5M Medipore H Soft Cloth Surgical T ape, 4 x 10 (in/yd) Discharge Instruction: Secure with tape as directed. Compression Wrap Compression Stockings Add-Ons Electronic Signature(s) Signed: 06/07/2022 5:18:48 PM By: Sharyn Creamer RN, BSN Entered By: Sharyn Creamer on 06/07/2022 13:07:18 [name] ([id number]) [id number] Page 9 of 9 -------------------------------------------------------------------------------- Vitals Details Patient Name: Date of Service: [name] 06/07/2022 12:45 PM Medical Record Number: [id number] Patient Account Number: [id number] Date of Birth/Sex: Treating RN: [birth year] (71 y.o. [name] Primary Care Kenzee Bassin: Deland Pretty Other Clinician: Referring Fotini Lemus: Treating Gurnie Duris/Extender: Judie Grieve in Treatment: 28 Vital Signs Time Taken: 12:42 Temperature (F): 97.9 Height (in): 67 Pulse (bpm): 54 Weight (lbs):  340 Respiratory Rate (breaths/min): 18 Body Mass Index (BMI): 53.2 Blood Pressure (mmHg): 160/90 Reference Range: 80 - 120 mg / dl Electronic Signature(s) Signed: 06/07/2022 5:18:48 PM By: Sharyn Creamer RN, BSN Entered By: Sharyn Creamer on 06/07/2022 12:43:03

## 2022-06-07 NOTE — Progress Notes (Addendum)
SEQUOYA, HOGSETT ([id number]) [id number] Page 1 of 9 Visit Report for 06/07/2022 Chief Complaint Document Details Patient Name: Date of Service: [name] 06/07/2022 12:45 PM Medical Record Number: [id number] Patient Account Number: [id number] Date of Birth/Sex: Treating RN: 1951-01-21 (71 y.o. F) Primary Care Provider: [name] Other Clinician: Referring Provider: Treating Provider/Extender: Annamary Rummage in Treatment: 28 Information Obtained from: Patient Chief Complaint 11/19/2021; Left lower extremity wound status post fall Electronic Signature(s) Signed: 06/07/2022 4:33:46 PM By: Geralyn Corwin DO Entered By: Geralyn Corwin on 06/07/2022 13:14:58 -------------------------------------------------------------------------------- HPI Details Patient Name: Date of Service: Christine Dupes [name]. 06/07/2022 12:45 PM Medical Record Number: [id number] Patient Account Number: [id number] Date of Birth/Sex: Treating RN: Jun 10, 1951 (71 y.o. F) Primary Care Provider: [name] Other Clinician: Referring Provider: Treating Provider/Extender: Annamary Rummage in Treatment: 28 History of Present Illness HPI Description: Admission 11/19/2021 [name] is a 71 year old female with a past medical history of paroxysmal A-fib on Eliquis, hypothyroidism, major depressive disorder, venous insufficiency and chronic diastolic heart failure that presents to the clinic for a 1 month history of nonhealing wound to the left lower extremity. She visited the ED on 10/18/2021 after a mechanical fall. She developed a hematoma that subsequently opened. She was hospitalized for 7 days and discharged on 10/25/2021. She has been on several different antibiotics For the past month. She states that most recently she was on Levaquin and linezolid for the past week. She states she completes her antibiotic course  tomorrow. She has been using Dakin's wet-to-dry dressings to the wound bed. She denies signs of infection. 4/7; patient presents for follow-up. She has been using Dakin's wet-to-dry dressings. She did end up going to the ED on 4/1 because she had excess bleeding with dressing change that she could not stop. She is on Eliquis for A-fib. In the ED they tied off a small artery. She has had no issues since discharge. She denies signs of infection. 4/14; this is a very difficult clinical situation. A patient with underlying chronic venous insufficiency and lymphedema very significant lower extremity edema had a hematoma after a fall on her left upper lateral lower leg. She is on Eliquis for atrial fibrillation apparently with a history of a splenic infarct following with Dr. Berton Mount of cardiology. She has exhibited significant bleeding from the wound surface including ao Venous bleeder that required suturing short while ago. She has been using Dakin's wet-to-dry packing and over the surface of the wound. She saw Dr. Graciela Husbands yesterday he is reluctant to consider stopping the Eliquis because of the prior history of presumed cardioembolism. Wants to communicate with Dr. Mikey Bussing when she returns. In a perfect world where she was not on Eliquis she requires a wound VAC with additional compression wraps but I understand the reluctance to do this because of the concerns of bleeding 4/28; the patient's wound actually looks better today using Dakin's wet-to-dry that she is changing twice a day she is wrapping this with Kerlix and Ace wrapping. She tells me she had 2 small bleeding areas which were part of the superficial wound that stopped this week with direct pressure. Other than that no major issues. In follow-up from discussion of last week Dr. 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 The bigger  question from my point of view is the wound VAC issue. As far as she knows and her daughter-in-law verifies that she has not had any bleeding from the deeper parts of the wound although the bleeding has been superficial including the one that sent her to the ER for stitches. She is concerned that a wound VAC would cause further bleeding and I cannot completely allay those concerns. NEOSHA, SWITALSKI ([id number]) [id number] Page 2 of 9 5/8; patient presents for follow-up. She has no issues or complaints today. She has been using Dakin's wet-to-dry dressings without issues. She denies signs of infection. 5/12; patient presents for follow-up. She continues to use Dakin's wet-to-dry dressings without issues. She denies signs of infection. She reports some issues with bleeding at times but this has improved. She denies signs of infection. 6/1; patient presents for follow-up. She was recently hospitalized for upper left leg thigh cellulitis. She was given IV cefepime and vancomycin and discharged on oral antibiotics. She has been using Dakin's wet-to-dry dressings to the left lower leg wound. She reports improvement in healing. She currently denies systemic signs of infection. 6/7; patient is using Dakin's wet-to-dry twice daily. In general this looks better than when I saw this a month or so ago however still considerable depth to the tunnel in her left leg. She is going for iron infusions ordered by her primary care doctor I believe 6/15; patient presents for follow-up. She has been using Dakin's wet-to-dry dressings. She has noted some scattered small areas that blister up and heal on her left lower leg. She has been using mupirocin ointment on them. Nothing open today. 6/23; patient's been using Dakin's wet-to-dry dressings to the tunneled wound and Hydrofera Blue to the opening. She has no issues or complaints  today. 6/29; patient presents for follow-up. She has been using Dakin's wet-to-dry dressings to the tunneled wound and Hydrofera Blue to the opening. She states that [facility] is sticking to the wound bed. She denies signs of infection. 7/7; patient presents for follow-up. She has been using Dakin's wet-to-dry dressings to the tunneled wound and PolyMem silver to the opening without issues. 7/13; patient presents for follow-up. She has been using PolyMem silver to the opening and the rope to the tunnel. She has no issues or complaints today. She denies signs of infection. 7/20; patient presents for follow-up. We have been using PolyMem silver to the wound bed. She has no issues or complaints today. She is receiving her custom compression garments tomorrow. 7/27; patient presents for follow-up. She has been using PolyMem silver to the wound bed. She is having her custom compression garments adjusted as these are not staying on very easily. 8/30; patient presents for follow-up. We have been using PolyMem silver to the wound bed. She is still waiting on her custom compression garments to be ordered. She denies signs of infection. 8/11; patient presents for follow-up. The wound VAC has been started and patient has been using this for the past week. Patient has home health who changes the wound VAC. She developed irritation to the periwound. DuoDERM was not being used to the periwound despite being ordered. 8/15; patient presents for follow-up. She restarted the wound VAC and has had improvement to the periwound with the use of DuoDERM. 8/24; patient presents for follow-up. She has been using the wound VAC. She has developed a wound just superior to the original wound. Likely as a result from the wound VAC. She denies signs of infection. 8/29; patient presents  for follow-up. We have been using collagen to the wound bed. She has held off on the wound VAC for the past week. 9/7; patient presents for  follow-up. We have been using iodoform packing to the wound bed. She has mild tenderness to the periwound. She denies systemic signs of infection. 9/12; patient presents for follow-up. Patient has been using Dakin's wet-to-dry dressings. She has no issues or complaints today. She denies signs of infection. 9/21; patient presents for follow-up. PuraPly #1 was placed in standard fashion at last clinic visit. She has no issues or complaints today. She denies signs of infection. 10/3; patient presents for follow-up. PuraPly #2 was placed in standard fashion at last clinic visit. She has no issues or complaints today. She denies signs of infection. 10/10; patient presents for follow-up. PuraPly #3 was placed in standard fashion at last clinic visit. She has no issues or complaints today. She denies signs of infection. 10/17; patient presents for follow-up. She has been using silver alginate to the tunneled wound. She has no issues or complaints today. Electronic Signature(s) Signed: 06/07/2022 4:33:46 PM By: Geralyn Corwin DO Entered By: Geralyn Corwin on 06/07/2022 13:15:17 -------------------------------------------------------------------------------- Physical Exam Details Patient Name: Date of Service: [name] 06/07/2022 12:45 PM Medical Record Number: [id number] Patient Account Number: [id number] Date of Birth/Sex: Treating RN: 02-09-1951 (71 y.o. F) Primary Care Provider: [name] Other Clinician: Referring Provider: Treating Provider/Extender: Annamary Rummage in Treatment: 28 Constitutional respirations regular, non-labored and within target range for patient.[name], [name] ([id number]) [id number] Page 3 of 9 Cardiovascular 2+ dorsalis pedis/posterior tibialis pulses. Psychiatric pleasant and cooperative. Notes Left lower extremity: Open wound with granulation tissue at the opening with a depth of 4 cm. No  increased warmth, erythema or purulent drainage. No tenderness on palpation. Electronic Signature(s) Signed: 06/07/2022 4:33:46 PM By: Geralyn Corwin DO Entered By: Geralyn Corwin on 06/07/2022 13:15:53 -------------------------------------------------------------------------------- Physician Orders Details Patient Name: Date of Service: [name] LYN [name]. 06/07/2022 12:45 PM Medical Record Number: [id number] Patient Account Number: [id number] Date of Birth/Sex: Treating RN: September 25, 1950 (71 y.o. [name] Primary Care Provider: [name] Other Clinician: Referring Provider: Treating Provider/Extender: Annamary Rummage in Treatment: 100 Verbal / Phone Orders: No Diagnosis Coding Follow-up Appointments ppointment in 1 week. - Dr. Mikey Bussing and Yvonne Kendall, Room 8 Return A ppointment in 2 weeks. - Dr. Mikey Bussing and Yvonne Kendall, Room 8 Return A Other: - order wound vac from [facility] Anesthetic (In clinic) Topical Lidocaine 5% applied to wound bed Cellular or Tissue Based Products Wound #1 Left,Lateral Lower Leg Cellular or Tissue Based Product Type: - 05/12/2022 Puraply AM #1 applied. 05/17/22 PURAPLY AM # 2 05/24/2022 Puraply AM #3 05/31/2022 HOLD Bathing/ Shower/ Hygiene May shower with protection but do not get wound dressing(s) wet. Negative Presssure Wound Therapy Wound Vac to wound continuously at 182mm/hg pressure - change three times a week. Sterile Gauze Packing Edema Control - Lymphedema / SCD / Other Elevate legs to the level of the heart or above for 30 minutes daily and/or when sitting, a frequency of: - 3-4 times a day throughout the day. Avoid standing for long periods of time. Moisturize legs daily. - both legs every night before bed. Home Health New wound care orders this week; continue Home Health for wound care. May utilize formulary equivalent dressing for wound treatment orders unless otherwise specified. - wet plain packing strips  with dakins, pack wound, cover with ABD, secure with kerlix and  Ace wrap Dressing changes to be completed by Home Health on Monday / Wednesday / Friday except when [facility]. - once wound vac arrives place three times a week. at wound care appts will apply Dakin's Wet to dry dressings. Other Home Health Orders/Instructions: - [facility] Wound Treatment Wound #1 - Lower Leg Wound Laterality: Left, Lateral Cleanser: Wound Cleanser (Home Health) 1 x Per Day/30 Days Discharge Instructions: Cleanse the wound with wound cleanser prior to applying a clean dressing using gauze sponges, not tissue or cotton balls. Prim Dressing: Plain packing strip 1/2 (in) 1 x Per Day/30 Days ary Discharge Instructions: Lightly pack as instructed Prim Dressing: Dakin's Solution 0.125%, 16 (oz) ary 1 x Per Day/30 Days [name], [name] ([id number]) [id number] Page 4 of 9 Discharge Instructions: Moisten gauze with Dakin's solution Secondary Dressing: ABD Pad, 5x9 1 x Per Day/30 Days Discharge Instructions: Apply over primary dressing as directed. Secondary Dressing: Woven Gauze Sponge, Non-Sterile 4x4 in (Home Health) 1 x Per Day/30 Days Discharge Instructions: Apply over primary dressing as directed. Secured With: [employer] Roll Sterile, 4.5x3.1 (in/yd) (Home Health) 1 x Per Day/30 Days Discharge Instructions: Secure with Kerlix as directed. Secured With: 7M Medipore H Soft Cloth Surgical T ape, 4 x 10 (in/yd) (Home Health) 1 x Per Day/30 Days Discharge Instructions: Secure with tape as directed. Patient Medications llergies: Augmentin A Notifications Medication Indication Start End prior to debridement 06/07/2022 lidocaine DOSE topical 5 % ointment - ointment topical Electronic Signature(s) Signed: 06/10/2022 11:55:19 AM By: Geralyn CorwinHoffman, Matteo Banke DO Signed: 06/10/2022 6:08:03 PM By: Shawn Stalleaton, Bobbi RN, BSN Previous Signature: 06/08/2022 5:13:40 PM  Version By: Redmond PullingPalmer, Carrie RN, BSN Previous Signature: 06/09/2022 10:44:44 AM Version By: Geralyn CorwinHoffman, Aahil Fredin DO Previous Signature: 06/07/2022 4:33:46 PM Version By: Geralyn CorwinHoffman, Sricharan Lacomb DO Entered By: Shawn Stalleaton, Bobbi on 06/10/2022 09:13:06 -------------------------------------------------------------------------------- Problem List Details Patient Name: Date of Service: Christine RollingO RRIS, [name]. 06/07/2022 12:45 PM Medical Record Number: [id number] Patient Account Number: [id number] Date of Birth/Sex: Treating RN: 07/27/1951 (71 y.o. 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/31/2023 No Yes T79.8XXA Other early complications of trauma, initial encounter 11/19/2021 No Yes I87.312 Chronic venous hypertension (idiopathic) with ulcer of left lower extremity 04/28/2022 No Yes I89.0 Lymphedema, not elsewhere classified 04/28/2022 No Yes I48.0 Paroxysmal atrial fibrillation 11/19/2021 No Yes Z79.01 Long term (current) use of anticoagulants 11/19/2021 No Yes [name], Shanara [name] ([id number]) [id number] Page 5 of 9 Inactive Problems Resolved Problems Electronic Signature(s) Signed: 06/07/2022 4:33:46 PM By: Geralyn CorwinHoffman, Cane Dubray DO Entered By: Geralyn CorwinHoffman, Chayse Zatarain on 06/07/2022 13:14:43 -------------------------------------------------------------------------------- Progress Note Details Patient Name: Date of Service: Christine MunsonNO RRIS, [name]. 06/07/2022 12:45 PM Medical Record Number: [id number] Patient Account Number: [id number] Date of Birth/Sex: Treating RN: 03/08/1951 (71 y.o. F) Primary Care Provider: Merri BrunettePharr, Walter Other Clinician: Referring Provider: Treating Provider/Extender: Annamary RummageHoffman, Lasean Gorniak Pharr,  Walter Weeks in Treatment: 28 Subjective Chief Complaint Information obtained from Patient 11/19/2021; Left lower extremity wound status post fall History of Present Illness (HPI) Admission 11/19/2021 Ms. Christine PerkingGwendolyn [name] is a 71 year old female with a past medical history of paroxysmal A-fib on Eliquis, hypothyroidism, major depressive disorder, venous insufficiency and chronic diastolic heart failure that presents to the clinic for a 1 month history of nonhealing wound to the left lower extremity. She visited  the ED on 10/18/2021 after a mechanical fall. She developed a hematoma that subsequently opened. She was hospitalized for 7 days and discharged on 10/25/2021. She has been on several different antibiotics For the past month. She states that most recently she was on Levaquin and linezolid for the past week. She states she completes her antibiotic course tomorrow. She has been using Dakin's wet-to-dry dressings to the wound bed. She denies signs of infection. 4/7; patient presents for follow-up. She has been using Dakin's wet-to-dry dressings. She did end up going to the ED on 4/1 because she had excess bleeding with dressing change that she could not stop. She is on Eliquis for A-fib. In the ED they tied off a small artery. She has had no issues since discharge. She denies signs of infection. 4/14; this is a very difficult clinical situation. A patient with underlying chronic venous insufficiency and lymphedema very significant lower extremity edema had a hematoma after a fall on her left upper lateral lower leg. She is on Eliquis for atrial fibrillation apparently with a history of a splenic infarct following with Dr. Berton Mount of cardiology. She has exhibited significant bleeding from the wound surface including ao Venous bleeder that required suturing short while ago. She has been using Dakin's wet-to-dry packing and over the surface of the wound. She saw Dr. Graciela Husbands yesterday he is reluctant  to consider stopping the Eliquis because of the prior history of presumed cardioembolism. Wants to communicate with Dr. Mikey Bussing when she returns. In a perfect world where she was not on Eliquis she requires a wound VAC with additional compression wraps but I understand the reluctance to do this because of the concerns of bleeding 4/28; the patient's wound actually looks better today using Dakin's wet-to-dry that she is changing twice a day she is wrapping this with Kerlix and Ace wrapping. She tells me she had 2 small bleeding areas which were part of the superficial wound that stopped this week with direct pressure. Other than that no major issues. In follow-up from discussion of last week Dr. 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 The bigger question from my point of view is the wound VAC issue. As far as she knows and her daughter-in-law verifies that she has not had any bleeding from the deeper parts of the wound although the bleeding has been superficial including the one that sent her to the ER for stitches. She is concerned that a wound VAC would cause further bleeding and I cannot completely allay those concerns. 5/8; patient presents for follow-up. She has no issues or complaints today. She has been using Dakin's wet-to-dry dressings without issues. She denies signs of infection. 5/12; patient presents for follow-up. She continues to use Dakin's wet-to-dry dressings without issues. She denies signs of infection. She reports some issues with bleeding at times but this has improved. She denies signs of infection. 6/1; patient presents for follow-up. She was recently hospitalized for upper left leg thigh cellulitis. She was given IV cefepime and vancomycin and discharged on oral antibiotics. She has been using Dakin's  wet-to-dry dressings to the left lower leg wound. She reports improvement in healing. She currently denies systemic signs of infection. 6/7; patient is using Dakin's wet-to-dry twice daily. In general this looks better than when I saw this a month or so  ago however still considerable depth to the tunnel in her left leg. She is going for iron infusions ordered by her primary care doctor I believe 6/15; patient presents for follow-up. She has been using Dakin's wet-to-dry dressings. She has noted some scattered small areas that blister up and heal on her left lower leg. She has been using mupirocin ointment on them. Nothing open today. [name], [name] ([id number]) [id number] Page 6 of 9 6/23; patient's been using Dakin's wet-to-dry dressings to the tunneled wound and Hydrofera Blue to the opening. She has no issues or complaints today. 6/29; patient presents for follow-up. She has been using Dakin's wet-to-dry dressings to the tunneled wound and Hydrofera Blue to the opening. She states that [facility] is sticking to the wound bed. She denies signs of infection. 7/7; patient presents for follow-up. She has been using Dakin's wet-to-dry dressings to the tunneled wound and PolyMem silver to the opening without issues. 7/13; patient presents for follow-up. She has been using PolyMem silver to the opening and the rope to the tunnel. She has no issues or complaints today. She denies signs of infection. 7/20; patient presents for follow-up. We have been using PolyMem silver to the wound bed. She has no issues or complaints today. She is receiving her custom compression garments tomorrow. 7/27; patient presents for follow-up. She has been using PolyMem silver to the wound bed. She is having her custom compression garments adjusted as these are not staying on very easily. 8/30; patient presents for follow-up. We have been using PolyMem silver to the wound bed. She is still  waiting on her custom compression garments to be ordered. She denies signs of infection. 8/11; patient presents for follow-up. The wound VAC has been started and patient has been using this for the past week. Patient has home health who changes the wound VAC. She developed irritation to the periwound. DuoDERM was not being used to the periwound despite being ordered. 8/15; patient presents for follow-up. She restarted the wound VAC and has had improvement to the periwound with the use of DuoDERM. 8/24; patient presents for follow-up. She has been using the wound VAC. She has developed a wound just superior to the original wound. Likely as a result from the wound VAC. She denies signs of infection. 8/29; patient presents for follow-up. We have been using collagen to the wound bed. She has held off on the wound VAC for the past week. 9/7; patient presents for follow-up. We have been using iodoform packing to the wound bed. She has mild tenderness to the periwound. She denies systemic signs of infection. 9/12; patient presents for follow-up. Patient has been using Dakin's wet-to-dry dressings. She has no issues or complaints today. She denies signs of infection. 9/21; patient presents for follow-up. PuraPly #1 was placed in standard fashion at last clinic visit. She has no issues or complaints today. She denies signs of infection. 10/3; patient presents for follow-up. PuraPly #2 was placed in standard fashion at last clinic visit. She has no issues or complaints today. She denies signs of infection. 10/10; patient presents for follow-up. PuraPly #3 was placed in standard fashion at last clinic visit. She has no issues or complaints today. She denies signs of infection. 10/17; patient presents for follow-up. She has been using silver alginate to the tunneled wound. She has no issues or complaints today. Patient History Medical History Cardiovascular Patient has history of Congestive Heart Failure,  Hypertension Hospitalization/Surgery History - Cellulitis left leg- 01/03/2022-01/07/2022. Medical A Surgical  History Notes nd Constitutional Symptoms (General Health) Infarction of spleen Hematologic/Lymphatic Hypothyroidism Cardiovascular A-Fib Gastrointestinal Gastroesophageal reflux Genitourinary Chronic kidney disease Musculoskeletal Osteoarthritis of knee Psychiatric Anxiety Objective Constitutional respirations regular, non-labored and within target range for patient.. Vitals Time Taken: 12:42 PM, Height: 67 in, Weight: 340 lbs, BMI: 53.2, Temperature: 97.9 F, Pulse: 54 bpm, Respiratory Rate: 18 breaths/min, Blood Pressure: 160/90 mmHg. Cardiovascular 2+ dorsalis pedis/posterior tibialis pulses. [name], [name] ([id number]) [id number] Page 7 of 9 Psychiatric pleasant and cooperative. General Notes: Left lower extremity: Open wound with granulation tissue at the opening with a depth of 4 cm. No increased warmth, erythema or purulent drainage. No tenderness on palpation. Integumentary (Hair, Skin) Wound #1 status is Open. Original cause of wound was Trauma. The date acquired was: 10/18/2021. The wound has been in treatment 28 weeks. The wound is located on the Left,Lateral Lower Leg. The wound measures 0.7cm length x 0.7cm width x 0.5cm depth; 0.385cm^2 area and 0.192cm^3 volume. There is Fat Layer (Subcutaneous Tissue) exposed. There is no undermining noted, however, there is tunneling at 12:00 with a maximum distance of 4.3cm. There is a medium amount of serosanguineous drainage noted. The wound margin is distinct with the outline attached to the wound base. There is large (67-100%) red, friable granulation within the wound bed. There is no necrotic tissue within the wound bed. The periwound skin appearance exhibited: Induration, Hemosiderin Staining. The periwound skin appearance did not exhibit: Callus, Crepitus, Excoriation, Rash, Scarring,  Dry/Scaly, Maceration, Atrophie Blanche, Cyanosis, Ecchymosis, Mottled, Pallor, Rubor, Erythema. Periwound temperature was noted as No Abnormality. Assessment Active Problems ICD-10 Non-pressure chronic ulcer of other part of left lower leg with fat layer exposed Other early complications of trauma, initial encounter Chronic venous hypertension (idiopathic) with ulcer of left lower extremity Lymphedema, not elsewhere classified Paroxysmal atrial fibrillation Long term (current) use of anticoagulants Patient's tunneled wound is slightly deeper. No signs of infection. We have tried several wound care dressings including a skin substitute with little benefit. I think at this time we will need to go back to the wound VAC. She can use this with gauze. In the meantime use Dakin's wet-to-dry dressings. Plan Follow-up Appointments: Return Appointment in 1 week. - Dr. Mikey Bussing and Yvonne Kendall, Room 8 Return Appointment in 2 weeks. - Dr. Mikey Bussing and Yvonne Kendall, Room 8 Other: - order wound vac from [facility] Anesthetic: (In clinic) Topical Lidocaine 5% applied to wound bed Cellular or Tissue Based Products: Wound #1 Left,Lateral Lower Leg: Cellular or Tissue Based Product Type: - 05/12/2022 Puraply AM #1 applied. 05/17/22 PURAPLY AM # 2 05/24/2022 Puraply AM #3 05/31/2022 HOLD Bathing/ Shower/ Hygiene: May shower with protection but do not get wound dressing(s) wet. Edema Control - Lymphedema / SCD / Other: Elevate legs to the level of the heart or above for 30 minutes daily and/or when sitting, a frequency of: - 3-4 times a day throughout the day. Avoid standing for long periods of time. Moisturize legs daily. - both legs every night before bed. Home Health: New wound care orders this week; continue Home Health for wound care. May utilize formulary equivalent dressing for wound treatment orders unless otherwise specified. - Calcium alginate Ag lightly pack into wound bed. Change daily clean with dakins. Other Home  Health Orders/Instructions: - [facility] The following medication(s) was prescribed: lidocaine topical 5 % ointment ointment topical for prior to debridement was prescribed at facility WOUND #1: - Lower Leg Wound Laterality: Left, Lateral Cleanser: Wound Cleanser (Home Health) 1 x Per Day/30  Days Discharge Instructions: Cleanse the wound with wound cleanser prior to applying a clean dressing using gauze sponges, not tissue or cotton balls. Prim Dressing: Plain packing strip 1/2 (in) 1 x Per Day/30 Days ary Discharge Instructions: Lightly pack as instructed Prim Dressing: Dakin's Solution 0.125%, 16 (oz) 1 x Per Day/30 Days ary Discharge Instructions: Moisten gauze with Dakin's solution Secondary Dressing: ABD Pad, 5x9 1 x Per Day/30 Days Discharge Instructions: Apply over primary dressing as directed. Secondary Dressing: Woven Gauze Sponge, Non-Sterile 4x4 in (Home Health) 1 x Per Day/30 Days Discharge Instructions: Apply over primary dressing as directed. Secured With: [employer], 4.5x3.1 (in/yd) (Home Health) 1 x Per Day/30 Days Discharge Instructions: Secure with Kerlix as directed. Secured With: 20M Medipore H Soft Cloth Surgical T ape, 4 x 10 (in/yd) (Home Health) 1 x Per Day/30 Days Discharge Instructions: Secure with tape as directed. 1. Order wound VAC 2. Dakin's wet-to-dry dressing 3. Follow-up in 1 week Electronic Signature(s) Signed: 06/07/2022 4:33:46 PM By: Lourena Simmonds ([id number]) By: Kalman Shan DO [id number] Page 8 of 9 Signed: 06/07/2022 4:33:46 PM Entered By: Kalman Shan on 06/07/2022 13:17:49 -------------------------------------------------------------------------------- HxROS Details Patient Name: Date of Service: [name] 06/07/2022 12:45 PM Medical Record Number: [id number] Patient Account Number: [id number] Date of Birth/Sex: Treating RN: 1951/04/27 (71 y.o. F) Primary  Care Provider: Deland Pretty Other Clinician: Referring Provider: Treating Provider/Extender: Judie Grieve in Treatment: 28 Constitutional Symptoms (General Health) Medical History: Past Medical History Notes: Infarction of spleen Hematologic/Lymphatic Medical History: Past Medical History Notes: Hypothyroidism Cardiovascular Medical History: Positive for: Congestive Heart Failure; Hypertension Past Medical History Notes: A-Fib Gastrointestinal Medical History: Past Medical History Notes: Gastroesophageal reflux Genitourinary Medical History: Past Medical History Notes: Chronic kidney disease Musculoskeletal Medical History: Past Medical History Notes: Osteoarthritis of knee Psychiatric Medical History: Past Medical History Notes: Anxiety Immunizations Pneumococcal Vaccine: Received Pneumococcal Vaccination: No Implantable Devices No devices added Hospitalization / Surgery History Type of Hospitalization/Surgery Cellulitis left leg- 01/03/2022-01/07/2022 Electronic Signature(s) Signed: 06/07/2022 4:33:46 PM By: Evette Cristal, Mart Piggs ([id number]) [id number] Page 9 of 9 Entered By: Kalman Shan on 06/07/2022 13:15:21 -------------------------------------------------------------------------------- SuperBill Details Patient Name: Date of Service: [name] 06/07/2022 Medical Record Number: [id number] Patient Account Number: [id number] Date of Birth/Sex: Treating RN: 19-Jul-1951 (71 y.o. F) Primary Care Provider: Deland Pretty Other Clinician: Referring Provider: Treating Provider/Extender: Judie Grieve in Treatment: 28 Diagnosis Coding ICD-10 Codes Code Description [id number] Non-pressure chronic ulcer of other part of left lower leg with fat layer exposed T79.8XXA Other early complications of trauma, initial encounter I87.312 Chronic venous hypertension  (idiopathic) with ulcer of left lower extremity I89.0 Lymphedema, not elsewhere classified I48.0 Paroxysmal atrial fibrillation Z79.01 Long term (current) use of anticoagulants Facility Procedures : CPT4 Code: [id number] Description: 99213 - WOUND CARE VISIT-LEV 3 EST PT Modifier: 25 Quantity: 1 Physician Procedures : CPT4 Code Description Modifier [id number] - WC PHYS LEVEL 3 - EST PT ICD-10 Diagnosis Description L97.822 Non-pressure chronic ulcer of other part of left lower leg with fat layer exposed T79.8XXA Other early complications of trauma, initial  encounter I87.312 Chronic venous hypertension (idiopathic) with ulcer of left lower extremity I89.0 Lymphedema, not elsewhere classified Quantity: 1 Electronic Signature(s) Signed: 06/07/2022 5:18:48 PM By: Sharyn Creamer RN, BSN Signed: 06/09/2022 10:44:44 AM By: Kalman Shan DO Previous Signature: 06/07/2022 4:33:46 PM Version By: Kalman Shan DO Entered By: Sharyn Creamer on 06/07/2022 17:05:44

## 2022-06-08 ENCOUNTER — Ambulatory Visit (HOSPITAL_BASED_OUTPATIENT_CLINIC_OR_DEPARTMENT_OTHER): Payer: PPO | Admitting: Internal Medicine

## 2022-06-08 DIAGNOSIS — E039 Hypothyroidism, unspecified: Secondary | ICD-10-CM | POA: Diagnosis not present

## 2022-06-08 DIAGNOSIS — Z23 Encounter for immunization: Secondary | ICD-10-CM | POA: Diagnosis not present

## 2022-06-08 DIAGNOSIS — D500 Iron deficiency anemia secondary to blood loss (chronic): Secondary | ICD-10-CM | POA: Diagnosis not present

## 2022-06-08 DIAGNOSIS — Z7901 Long term (current) use of anticoagulants: Secondary | ICD-10-CM | POA: Diagnosis not present

## 2022-06-08 DIAGNOSIS — Z0000 Encounter for general adult medical examination without abnormal findings: Secondary | ICD-10-CM | POA: Diagnosis not present

## 2022-06-08 DIAGNOSIS — M8580 Other specified disorders of bone density and structure, unspecified site: Secondary | ICD-10-CM | POA: Diagnosis not present

## 2022-06-08 DIAGNOSIS — I480 Paroxysmal atrial fibrillation: Secondary | ICD-10-CM | POA: Diagnosis not present

## 2022-06-08 DIAGNOSIS — I10 Essential (primary) hypertension: Secondary | ICD-10-CM | POA: Diagnosis not present

## 2022-06-08 DIAGNOSIS — D735 Infarction of spleen: Secondary | ICD-10-CM | POA: Diagnosis not present

## 2022-06-08 DIAGNOSIS — F325 Major depressive disorder, single episode, in full remission: Secondary | ICD-10-CM | POA: Diagnosis not present

## 2022-06-08 DIAGNOSIS — I5032 Chronic diastolic (congestive) heart failure: Secondary | ICD-10-CM | POA: Diagnosis not present

## 2022-06-14 ENCOUNTER — Encounter (HOSPITAL_BASED_OUTPATIENT_CLINIC_OR_DEPARTMENT_OTHER): Payer: PPO | Admitting: Internal Medicine

## 2022-06-17 DIAGNOSIS — F325 Major depressive disorder, single episode, in full remission: Secondary | ICD-10-CM | POA: Diagnosis not present

## 2022-06-17 DIAGNOSIS — Z7901 Long term (current) use of anticoagulants: Secondary | ICD-10-CM | POA: Diagnosis not present

## 2022-06-17 DIAGNOSIS — Z9049 Acquired absence of other specified parts of digestive tract: Secondary | ICD-10-CM | POA: Diagnosis not present

## 2022-06-17 DIAGNOSIS — D500 Iron deficiency anemia secondary to blood loss (chronic): Secondary | ICD-10-CM | POA: Diagnosis not present

## 2022-06-17 DIAGNOSIS — L03116 Cellulitis of left lower limb: Secondary | ICD-10-CM | POA: Diagnosis not present

## 2022-06-17 DIAGNOSIS — I5032 Chronic diastolic (congestive) heart failure: Secondary | ICD-10-CM | POA: Diagnosis not present

## 2022-06-17 DIAGNOSIS — Z96653 Presence of artificial knee joint, bilateral: Secondary | ICD-10-CM | POA: Diagnosis not present

## 2022-06-17 DIAGNOSIS — Z6841 Body Mass Index (BMI) 40.0 and over, adult: Secondary | ICD-10-CM | POA: Diagnosis not present

## 2022-06-17 DIAGNOSIS — T24332D Burn of third degree of left lower leg, subsequent encounter: Secondary | ICD-10-CM | POA: Diagnosis not present

## 2022-06-17 DIAGNOSIS — Z9181 History of falling: Secondary | ICD-10-CM | POA: Diagnosis not present

## 2022-06-17 DIAGNOSIS — E039 Hypothyroidism, unspecified: Secondary | ICD-10-CM | POA: Diagnosis not present

## 2022-06-17 DIAGNOSIS — I4891 Unspecified atrial fibrillation: Secondary | ICD-10-CM | POA: Diagnosis not present

## 2022-06-17 DIAGNOSIS — E871 Hypo-osmolality and hyponatremia: Secondary | ICD-10-CM | POA: Diagnosis not present

## 2022-06-17 DIAGNOSIS — J309 Allergic rhinitis, unspecified: Secondary | ICD-10-CM | POA: Diagnosis not present

## 2022-06-17 DIAGNOSIS — I110 Hypertensive heart disease with heart failure: Secondary | ICD-10-CM | POA: Diagnosis not present

## 2022-06-21 ENCOUNTER — Other Ambulatory Visit (HOSPITAL_COMMUNITY): Payer: Self-pay | Admitting: Internal Medicine

## 2022-06-21 ENCOUNTER — Other Ambulatory Visit: Payer: Self-pay | Admitting: Internal Medicine

## 2022-06-21 ENCOUNTER — Encounter (HOSPITAL_BASED_OUTPATIENT_CLINIC_OR_DEPARTMENT_OTHER): Payer: PPO | Admitting: Internal Medicine

## 2022-06-21 DIAGNOSIS — I890 Lymphedema, not elsewhere classified: Secondary | ICD-10-CM | POA: Diagnosis not present

## 2022-06-21 DIAGNOSIS — T798XXA Other early complications of trauma, initial encounter: Secondary | ICD-10-CM | POA: Diagnosis not present

## 2022-06-21 DIAGNOSIS — L97822 Non-pressure chronic ulcer of other part of left lower leg with fat layer exposed: Secondary | ICD-10-CM

## 2022-06-21 DIAGNOSIS — B965 Pseudomonas (aeruginosa) (mallei) (pseudomallei) as the cause of diseases classified elsewhere: Secondary | ICD-10-CM | POA: Diagnosis not present

## 2022-06-21 DIAGNOSIS — L97921 Non-pressure chronic ulcer of unspecified part of left lower leg limited to breakdown of skin: Secondary | ICD-10-CM

## 2022-06-21 DIAGNOSIS — I87312 Chronic venous hypertension (idiopathic) with ulcer of left lower extremity: Secondary | ICD-10-CM | POA: Diagnosis not present

## 2022-06-21 DIAGNOSIS — L080 Pyoderma: Secondary | ICD-10-CM | POA: Diagnosis not present

## 2022-06-24 DIAGNOSIS — J309 Allergic rhinitis, unspecified: Secondary | ICD-10-CM | POA: Diagnosis not present

## 2022-06-24 DIAGNOSIS — F325 Major depressive disorder, single episode, in full remission: Secondary | ICD-10-CM | POA: Diagnosis not present

## 2022-06-24 DIAGNOSIS — Z96653 Presence of artificial knee joint, bilateral: Secondary | ICD-10-CM | POA: Diagnosis not present

## 2022-06-24 DIAGNOSIS — I5032 Chronic diastolic (congestive) heart failure: Secondary | ICD-10-CM | POA: Diagnosis not present

## 2022-06-24 DIAGNOSIS — L03116 Cellulitis of left lower limb: Secondary | ICD-10-CM | POA: Diagnosis not present

## 2022-06-24 DIAGNOSIS — Z7901 Long term (current) use of anticoagulants: Secondary | ICD-10-CM | POA: Diagnosis not present

## 2022-06-24 DIAGNOSIS — Z9049 Acquired absence of other specified parts of digestive tract: Secondary | ICD-10-CM | POA: Diagnosis not present

## 2022-06-24 DIAGNOSIS — T24332D Burn of third degree of left lower leg, subsequent encounter: Secondary | ICD-10-CM | POA: Diagnosis not present

## 2022-06-24 DIAGNOSIS — Z6841 Body Mass Index (BMI) 40.0 and over, adult: Secondary | ICD-10-CM | POA: Diagnosis not present

## 2022-06-24 DIAGNOSIS — D500 Iron deficiency anemia secondary to blood loss (chronic): Secondary | ICD-10-CM | POA: Diagnosis not present

## 2022-06-24 DIAGNOSIS — E871 Hypo-osmolality and hyponatremia: Secondary | ICD-10-CM | POA: Diagnosis not present

## 2022-06-24 DIAGNOSIS — I110 Hypertensive heart disease with heart failure: Secondary | ICD-10-CM | POA: Diagnosis not present

## 2022-06-24 DIAGNOSIS — E039 Hypothyroidism, unspecified: Secondary | ICD-10-CM | POA: Diagnosis not present

## 2022-06-24 DIAGNOSIS — Z9181 History of falling: Secondary | ICD-10-CM | POA: Diagnosis not present

## 2022-06-24 DIAGNOSIS — I4891 Unspecified atrial fibrillation: Secondary | ICD-10-CM | POA: Diagnosis not present

## 2022-06-24 NOTE — Progress Notes (Signed)
[name] ([id number]) [id number] Page 1 of 10 Visit Report for 06/21/2022 Chief Complaint Document Details Patient Name: Date of Service: [name] 06/21/2022 12:45 PM Medical Record Number: [id number] Patient Account Number: [id number] Date of Birth/Sex: Treating RN: 02-14-1951 (71 y.o. F) Primary Care Provider: [name] Other Clinician: Referring Provider: Treating Provider/Extender: [name] in Treatment: 30 Information Obtained from: Patient Chief Complaint 11/19/2021; Left lower extremity wound status post fall Electronic Signature(s) Signed: 06/24/2022 1:40:10 PM By: Christine Corwin DO Entered By: [name] on 06/21/2022 14:01:02 -------------------------------------------------------------------------------- HPI Details Patient Name: Date of Service: Christine Dupes [name]. 06/21/2022 12:45 PM Medical Record Number: [id number] Patient Account Number: [id number] Date of Birth/Sex: Treating RN: 1951/02/01 (71 y.o. F) Primary Care Provider: [name] Other Clinician: Referring Provider: Treating Provider/Extender: [name] in Treatment: 30 History of Present Illness HPI Description: Admission 11/19/2021 [name] is a 71 year old female with a past medical history of paroxysmal A-fib on Eliquis, hypothyroidism, major depressive disorder, venous insufficiency and chronic diastolic heart failure that presents to the clinic for a 1 month history of nonhealing wound to the left lower extremity. She visited the ED on 10/18/2021 after a mechanical fall. She developed a hematoma that subsequently opened. She was hospitalized for 7 days and discharged on 10/25/2021. She has been on several different antibiotics For the past month. She states that most recently she was on Levaquin and linezolid for the past week. She states she completes her antibiotic course  tomorrow. She has been using Dakin's wet-to-dry dressings to the wound bed. She denies signs of infection. 4/7; patient presents for follow-up. She has been using Dakin's wet-to-dry dressings. She did end up going to the ED on 4/1 because she had excess bleeding with dressing change that she could not stop. She is on Eliquis for A-fib. In the ED they tied off a small artery. She has had no issues since discharge. She denies signs of infection. 4/14; this is a very difficult clinical situation. A patient with underlying chronic venous insufficiency and lymphedema very significant lower extremity edema had a hematoma after a fall on her left upper lateral lower leg. She is on Eliquis for atrial fibrillation apparently with a history of a splenic infarct following with Dr. Berton [name] of cardiology. She has exhibited significant bleeding from the wound surface including ao Venous bleeder that required suturing short while ago. She has been using Dakin's wet-to-dry packing and over the surface of the wound. She saw [name] yesterday he is reluctant to consider stopping the Eliquis because of the prior history of presumed cardioembolism. Wants to communicate with [name] when she returns. In a perfect world where she was not on Eliquis she requires a wound VAC with additional compression wraps but I understand the reluctance to do this because of the concerns of bleeding 4/28; the patient's wound actually looks better today using Dakin's wet-to-dry that she is changing twice a day she is wrapping this with Kerlix and Ace wrapping. She tells me she had 2 small bleeding areas which were part of the superficial wound that stopped this week with direct pressure. Other than that no major issues. 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 The bigger  question from my point of view is the wound VAC issue. As far as she knows and her daughter-in-law verifies that she has not had any bleeding from the deeper parts of the wound although the bleeding has been superficial including the one that sent her to the ER for stitches. She is concerned that a wound VAC would cause further bleeding and I cannot completely allay those concerns. [name], [name] ([id number]) [id number] Page 2 of 10 5/8; patient presents for follow-up. She has no issues or complaints today. She has been using Dakin's wet-to-dry dressings without issues. She denies signs of infection. 5/12; patient presents for follow-up. She continues to use Dakin's wet-to-dry dressings without issues. She denies signs of infection. She reports some issues with bleeding at times but this has improved. She denies signs of infection. 6/1; patient presents for follow-up. She was recently hospitalized for upper left leg thigh cellulitis. She was given IV cefepime and vancomycin and discharged on oral antibiotics. She has been using Dakin's wet-to-dry dressings to the left lower leg wound. She reports improvement in healing. She currently denies systemic signs of infection. 6/7; patient is using Dakin's wet-to-dry twice daily. In general this looks better than when I saw this a month or so ago however still considerable depth to the tunnel in her left leg. She is going for iron infusions ordered by her primary care doctor I believe 6/15; patient presents for follow-up. She has been using Dakin's wet-to-dry dressings. She has noted some scattered small areas that blister up and heal on her left lower leg. She has been using mupirocin ointment on them. Nothing open today. 6/23; patient's been using Dakin's wet-to-dry dressings to the tunneled wound and Hydrofera Blue to the opening. She has no issues or complaints  today. 6/29; patient presents for follow-up. She has been using Dakin's wet-to-dry dressings to the tunneled wound and Hydrofera Blue to the opening. She states that [facility] is sticking to the wound bed. She denies signs of infection. 7/7; patient presents for follow-up. She has been using Dakin's wet-to-dry dressings to the tunneled wound and PolyMem silver to the opening without issues. 7/13; patient presents for follow-up. She has been using PolyMem silver to the opening and the rope to the tunnel. She has no issues or complaints today. She denies signs of infection. 7/20; patient presents for follow-up. We have been using PolyMem silver to the wound bed. She has no issues or complaints today. She is receiving her custom compression garments tomorrow. 7/27; patient presents for follow-up. She has been using PolyMem silver to the wound bed. She is having her custom compression garments adjusted as these are not staying on very easily. 8/30; patient presents for follow-up. We have been using PolyMem silver to the wound bed. She is still waiting on her custom compression garments to be ordered. She denies signs of infection. 8/11; patient presents for follow-up. The wound VAC has been started and patient has been using this for the past week. Patient has home health who changes the wound VAC. She developed irritation to the periwound. DuoDERM was not being used to the periwound despite being ordered. 8/15; patient presents for follow-up. She restarted the wound VAC and has had improvement to the periwound with the use of DuoDERM. 8/24; patient presents for follow-up. She has been using the wound VAC. She has developed a wound just superior to the original wound. Likely as a result from the wound VAC. She denies signs of infection. 8/29; patient presents  for follow-up. We have been using collagen to the wound bed. She has held off on the wound VAC for the past week. 9/7; patient presents for  follow-up. We have been using iodoform packing to the wound bed. She has mild tenderness to the periwound. She denies systemic signs of infection. 9/12; patient presents for follow-up. Patient has been using Dakin's wet-to-dry dressings. She has no issues or complaints today. She denies signs of infection. 9/21; patient presents for follow-up. PuraPly #1 was placed in standard fashion at last clinic visit. She has no issues or complaints today. She denies signs of infection. 10/3; patient presents for follow-up. PuraPly #2 was placed in standard fashion at last clinic visit. She has no issues or complaints today. She denies signs of infection. 10/10; patient presents for follow-up. PuraPly #3 was placed in standard fashion at last clinic visit. She has no issues or complaints today. She denies signs of infection. 10/17; patient presents for follow-up. She has been using silver alginate to the tunneled wound. She has no issues or complaints today. 10/31; patient presents for follow-up. We have been doing Dakin's wet-to-dry dressings to the tunneled wound. The wound VAC has been ordered however patient has not received this. She currently denies signs of infection. She reports a lot of serosanguineous drainage. Electronic Signature(s) Signed: 06/24/2022 1:40:10 PM By: Christine CorwinHoffman, Linzi Ohlinger DO Entered By: Christine CorwinHoffman, Javona Bergevin on 06/21/2022 14:01:47 -------------------------------------------------------------------------------- Physical Exam Details Patient Name: Date of Service: Christine MunsonO RRIS, Christine LYN [name]. 06/21/2022 12:45 PM Medical Record Number: [id number] Patient Account Number: [id number] Date of Birth/Sex: Treating RN: 11/05/1950 (71 y.o. F) Primary Care Provider: Merri BrunettePharr, Walter Other Clinician: Referring Provider: Treating Provider/Extender: Christine RummageHoffman, Jashan Cotten Pharr, Walter Weeks in Treatment: [address] [place], Ocie DoyneGWENDOLYN [name] ([id number]) [id number] Page 3 of  10 Constitutional respirations regular, non-labored and within target range for patient.. Cardiovascular 2+ dorsalis pedis/posterior tibialis pulses. Psychiatric pleasant and cooperative. Notes Left lower extremity: Open wound with granulation tissue at the opening with a depth of 3.3 cm. No increased warmth, erythema or purulent drainage. No tenderness on palpation. Electronic Signature(s) Signed: 06/24/2022 1:40:10 PM By: Christine CorwinHoffman, Ajamu Maxon DO Entered By: Christine CorwinHoffman, Arwyn Besaw on 06/21/2022 14:02:18 -------------------------------------------------------------------------------- Physician Orders Details Patient Name: Date of Service: Christine RollingO RRIS, Christine LYN [name]. 06/21/2022 12:45 PM Medical Record Number: [id number] Patient Account Number: [id number] Date of Birth/Sex: Treating RN: 06/22/1951 (71 y.o. Arta SilenceF) Deaton, Bobbi Primary Care Provider: Merri BrunettePharr, Walter Other Clinician: Referring Provider: Treating Provider/Extender: Christine RummageHoffman, Enzley Kitchens Pharr, Walter Weeks in Treatment: 30 Verbal / Phone Orders: No Diagnosis Coding ICD-10 Coding Code Description [phone] Non-pressure chronic ulcer of other part of left lower leg with fat layer exposed T79.8XXA Other early complications of trauma, initial encounter I87.312 Chronic venous hypertension (idiopathic) with ulcer of left lower extremity I89.0 Lymphedema, not elsewhere classified I48.0 Paroxysmal atrial fibrillation Z79.01 Long term (current) use of anticoagulants Follow-up Appointments ppointment in 2 weeks. - Tuesday with Dr. Mikey BussingHoffman 07/05/2022 Return A Other: - hold on ordering wound vac- [employer] Wound Vac. culture taken-will call you once results return if positive will send for topical antibiotics from [facility]. someone will call you to schedule the ultrasound. use clobetasol antifungal at home around the wound. Anesthetic (In clinic) Topical Lidocaine 5% applied to wound bed Cellular or Tissue Based Products Wound #1 Left,Lateral Lower  Leg Cellular or Tissue Based Product Type: - 05/12/2022 Puraply AM #1 applied. 05/17/22 PURAPLY AM # 2 05/24/2022 Puraply AM #3 05/31/2022 HOLD Bathing/ Shower/ Hygiene May shower with protection but do not get wound dressing(s) wet.  Negative Presssure Wound Therapy Other: - hold to order for now. Edema Control - Lymphedema / SCD / Other Elevate legs to the level of the heart or above for 30 minutes daily and/or when sitting, a frequency of: - 3-4 times a day throughout the day. Avoid standing for long periods of time. Moisturize legs daily. - both legs every night before bed. Home Health [name], [name] ([id number]) [id number] Page 4 of 10 New wound care orders this week; continue Home Health for wound care. May utilize formulary equivalent dressing for wound treatment orders unless otherwise specified. - wet plain packing strips with dakins, pack wound, cover with ABD, secure with kerlix and Ace wrap. home health weekly. Other Home Health Orders/Instructions: - [facility] Wound Treatment Wound #1 - Lower Leg Wound Laterality: Left, Lateral Cleanser: Wound Cleanser (Home Health) 1 x Per Day/30 Days Discharge Instructions: Cleanse the wound with wound cleanser prior to applying a clean dressing using gauze sponges, not tissue or cotton balls. Peri-Wound Care: Ketoconazole Cream 2% 1 x Per Day/30 Days Discharge Instructions: Apply to periwound in clinic. you use clobetasol at home. Prim Dressing: Plain packing strip 1/2 (in) 1 x Per Day/30 Days ary Discharge Instructions: Lightly pack as instructed Prim Dressing: Dakin's Solution 0.125%, 16 (oz) 1 x Per Day/30 Days ary Discharge Instructions: Moisten gauze with Dakin's solution Secondary Dressing: ABD Pad, 5x9 1 x Per Day/30 Days Discharge Instructions: Apply over primary dressing as directed. Secondary Dressing: Woven Gauze Sponge, Non-Sterile 4x4 in (Home Health) 1 x Per Day/30 Days Discharge  Instructions: Apply over primary dressing as directed. Secured With: [employer], 4.5x3.1 (in/yd) (Home Health) 1 x Per Day/30 Days Discharge Instructions: Secure with Kerlix as directed. Secured With: 33M Medipore H Soft Cloth Surgical T ape, 4 x 10 (in/yd) (Home Health) 1 x Per Day/30 Days Discharge Instructions: Secure with tape as directed. Compression Wrap: tubigrip size E 1 x Per Day/30 Days Discharge Instructions: apply in the morning and remove at night. Laboratory erobe culture (MICRO) - culture PCR left leg wound. - (ICD10 [phone] - Non-pressure chronic Bacteria identified in Unspecified specimen by A ulcer of other part of left lower leg with fat layer exposed) LOINC Code: 634-6 Convenience Name: Aerobic culture-specimen not specified [facility] ultrasound left lower leg - Ultrasound to left lower leg related to non healing ulcer to left leg looking for infection, sinus track, or abscess. CPT code 96759 - (ICD10 [phone] - Non-pressure chronic ulcer of other part of left lower leg with fat layer exposed) Electronic Signature(s) Signed: 06/24/2022 1:40:10 PM By: Christine Corwin DO Entered By: [name] on 06/21/2022 14:09:30 Prescription 06/21/2022 -------------------------------------------------------------------------------- [name]. Christine Corwin DO Patient Name: Provider: 10-Jun-1951 [id number] Date of Birth: NPI#: [id number] Sex: DEA #: [phone] Phone #: License #: Eligha Bridegroom [facility] Patient Address: [facility] [address] [place], [place] [address] [address] [place], [place] [address] [phone] Allergies Augmentin KRISHIKA, BUGGE ([id number]) [id number] Page 5 of 10 Provider's Orders ultrasound left lower leg - ICD10: L84.536 - Ultrasound to left lower leg related to non healing ulcer to left leg looking for infection, sinus track, or abscess. CPT  code 46803 Hand Signature: Date(s): Electronic Signature(s) Signed: 06/24/2022 1:40:10 PM By: Christine Corwin DO Entered By: [name] on 06/21/2022 14:09:31 -------------------------------------------------------------------------------- Problem List Details Patient Name: Date of Service: [name]. 06/21/2022 12:45 PM Medical Record Number: [id number] Patient Account Number: [id number] Date of Birth/Sex:  Treating RN: 08/19/1951 (71 y.o. [name] Primary Care Provider: [name] Other Clinician: Referring Provider: Treating Provider/Extender: [name] in Treatment: 30 Active Problems ICD-10 Encounter Code Description Active Date MDM Diagnosis [phone] Non-pressure chronic ulcer of other part of left lower leg with fat layer exposed3/31/2023 No Yes T79.8XXA Other early complications of trauma, initial encounter 11/19/2021 No Yes I87.312 Chronic venous hypertension (idiopathic) with ulcer of left lower extremity 04/28/2022 No Yes I89.0 Lymphedema, not elsewhere classified 04/28/2022 No Yes I48.0 Paroxysmal atrial fibrillation 11/19/2021 No Yes Z79.01 Long term (current) use of anticoagulants 11/19/2021 No Yes Inactive Problems Resolved Problems Electronic Signature(s) Signed: 06/24/2022 1:40:10 PM By: Christine Corwin DO Entered By: [name] on 06/21/2022 14:00:50 [name] ([id number]) [id number] Page 6 of 10 -------------------------------------------------------------------------------- Progress Note Details Patient Name: Date of Service: [name] 06/21/2022 12:45 PM Medical Record Number: [id number] Patient Account Number: [id number] Date of Birth/Sex: Treating RN: March 06, 1951 (71 y.o. F) Primary Care Provider: [name] Other Clinician: Referring Provider: Treating Provider/Extender: [name] in Treatment: 30 Subjective Chief  Complaint Information obtained from Patient 11/19/2021; Left lower extremity wound status post fall History of Present Illness (HPI) Admission 11/19/2021 Ms. Brezlyn Manrique is a 71 year old female with a past medical history of paroxysmal A-fib on Eliquis, hypothyroidism, major depressive disorder, venous insufficiency and chronic diastolic heart failure that presents to the clinic for a 1 month history of nonhealing wound to the left lower extremity. She visited the ED on 10/18/2021 after a mechanical fall. She developed a hematoma that subsequently opened. She was hospitalized for 7 days and discharged on 10/25/2021. She has been on several different antibiotics For the past month. She states that most recently she was on Levaquin and linezolid for the past week. She states she completes her antibiotic course tomorrow. She has been using Dakin's wet-to-dry dressings to the wound bed. She denies signs of infection. 4/7; patient presents for follow-up. She has been using Dakin's wet-to-dry dressings. She did end up going to the ED on 4/1 because she had excess bleeding with dressing change that she could not stop. She is on Eliquis for A-fib. In the ED they tied off a small artery. She has had no issues since discharge. She denies signs of infection. 4/14; this is a very difficult clinical situation. A patient with underlying chronic venous insufficiency and lymphedema very significant lower extremity edema had a hematoma after a fall on her left upper lateral lower leg. She is on Eliquis for atrial fibrillation apparently with a history of a splenic infarct following with Dr. Berton [name] of cardiology. She has exhibited significant bleeding from the wound surface including ao Venous bleeder that required suturing short while ago. She has been using Dakin's wet-to-dry packing and over the surface of the wound. She saw [name] yesterday he is reluctant to consider stopping the Eliquis because of the  prior history of presumed cardioembolism. Wants to communicate with [name] when she returns. In a perfect world where she was not on Eliquis she requires a wound VAC with additional compression wraps but I understand the reluctance to do this because of the concerns of bleeding 4/28; the patient's wound actually looks better today using Dakin's wet-to-dry that she is changing twice a day she is wrapping this with Kerlix and Ace wrapping. She tells me she had 2 small bleeding areas which were part of the superficial wound that stopped this week with direct pressure. Other  than that no major issues. In follow-up from discussion of last week Dr. 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 The bigger question from my point of view is the wound VAC issue. As far as she knows and her daughter-in-law verifies that she has not had any bleeding from the deeper parts of the wound although the bleeding has been superficial including the one that sent her to the ER for stitches. She is concerned that a wound VAC would cause further bleeding and I cannot completely allay those concerns. 5/8; patient presents for follow-up. She has no issues or complaints today. She has been using Dakin's wet-to-dry dressings without issues. She denies signs of infection. 5/12; patient presents for follow-up. She continues to use Dakin's wet-to-dry dressings without issues. She denies signs of infection. She reports some issues with bleeding at times but this has improved. She denies signs of infection. 6/1; patient presents for follow-up. She was recently hospitalized for upper left leg thigh cellulitis. She was given IV cefepime and vancomycin and discharged on oral antibiotics. She has been using Dakin's wet-to-dry dressings to the left lower leg wound. She  reports improvement in healing. She currently denies systemic signs of infection. 6/7; patient is using Dakin's wet-to-dry twice daily. In general this looks better than when I saw this a month or so ago however still considerable depth to the tunnel in her left leg. She is going for iron infusions ordered by her primary care doctor I believe 6/15; patient presents for follow-up. She has been using Dakin's wet-to-dry dressings. She has noted some scattered small areas that blister up and heal on her left lower leg. She has been using mupirocin ointment on them. Nothing open today. 6/23; patient's been using Dakin's wet-to-dry dressings to the tunneled wound and Hydrofera Blue to the opening. She has no issues or complaints today. 6/29; patient presents for follow-up. She has been using Dakin's wet-to-dry dressings to the tunneled wound and Hydrofera Blue to the opening. She states that [facility] is sticking to the wound bed. She denies signs of infection. 7/7; patient presents for follow-up. She has been using Dakin's wet-to-dry dressings to the tunneled wound and PolyMem silver to the opening without issues. 7/13; patient presents for follow-up. She has been using PolyMem silver to the opening and the rope to the tunnel. She has no issues or complaints today. She denies signs of infection. 7/20; patient presents for follow-up. We have been using PolyMem silver to the wound bed. She has no issues or complaints today. She is receiving her custom compression garments tomorrow. 7/27; patient presents for follow-up. She has been using PolyMem silver to the wound bed. She is having her custom compression garments adjusted as these are not staying on very easily. 8/30; patient presents for follow-up. We have been using PolyMem silver to the wound bed. She is still waiting on her custom compression garments to be ordered. She denies signs of infection. KINSLIE, HOVE ([id number])  [id number] Page 7 of 10 8/11; patient presents for follow-up. The wound VAC has been started and patient has been using this for the past week. Patient has home health who changes the wound VAC. She developed irritation to the periwound. DuoDERM was not being used to the periwound despite being ordered. 8/15; patient presents for follow-up. She restarted the  wound VAC and has had improvement to the periwound with the use of DuoDERM. 8/24; patient presents for follow-up. She has been using the wound VAC. She has developed a wound just superior to the original wound. Likely as a result from the wound VAC. She denies signs of infection. 8/29; patient presents for follow-up. We have been using collagen to the wound bed. She has held off on the wound VAC for the past week. 9/7; patient presents for follow-up. We have been using iodoform packing to the wound bed. She has mild tenderness to the periwound. She denies systemic signs of infection. 9/12; patient presents for follow-up. Patient has been using Dakin's wet-to-dry dressings. She has no issues or complaints today. She denies signs of infection. 9/21; patient presents for follow-up. PuraPly #1 was placed in standard fashion at last clinic visit. She has no issues or complaints today. She denies signs of infection. 10/3; patient presents for follow-up. PuraPly #2 was placed in standard fashion at last clinic visit. She has no issues or complaints today. She denies signs of infection. 10/10; patient presents for follow-up. PuraPly #3 was placed in standard fashion at last clinic visit. She has no issues or complaints today. She denies signs of infection. 10/17; patient presents for follow-up. She has been using silver alginate to the tunneled wound. She has no issues or complaints today. 10/31; patient presents for follow-up. We have been doing Dakin's wet-to-dry dressings to the tunneled wound. The wound VAC has been  ordered however patient has not received this. She currently denies signs of infection. She reports a lot of serosanguineous drainage. Patient History Medical History Cardiovascular Patient has history of Congestive Heart Failure, Hypertension Hospitalization/Surgery History - Cellulitis left leg- 01/03/2022-01/07/2022. Medical A Surgical History Notes nd Constitutional Symptoms (General Health) Infarction of spleen Hematologic/Lymphatic Hypothyroidism Cardiovascular A-Fib Gastrointestinal Gastroesophageal reflux Genitourinary Chronic kidney disease Musculoskeletal Osteoarthritis of knee Psychiatric Anxiety Objective Constitutional respirations regular, non-labored and within target range for patient.. Vitals Time Taken: 1:00 PM, Height: 67 in, Weight: 340 lbs, BMI: 53.2, Temperature: 97.8 F, Pulse: 47 bpm, Respiratory Rate: 17 breaths/min, Blood Pressure: 157/74 mmHg. Cardiovascular 2+ dorsalis pedis/posterior tibialis pulses. Psychiatric pleasant and cooperative. General Notes: Left lower extremity: Open wound with granulation tissue at the opening with a depth of 3.3 cm. No increased warmth, erythema or purulent drainage. No tenderness on palpation. Integumentary (Hair, Skin) Wound #1 status is Open. Original cause of wound was Trauma. The date acquired was: 10/18/2021. The wound has been in treatment 30 weeks. The wound is located on the Left,Lateral Lower Leg. The wound measures 0.5cm length x 0.5cm width x 0.5cm depth; 0.196cm^2 area and 0.098cm^3 volume. There is Fat Layer (Subcutaneous Tissue) exposed. There is no undermining noted, however, there is tunneling at 11:00 with a maximum distance of 3.3cm. There is a medium amount of serosanguineous drainage noted. The wound margin is distinct with the outline attached to the wound base. There is large (67-100%) red, friable granulation within the wound bed. There is no necrotic tissue within the wound bed. The periwound  skin appearance exhibited: Induration, Hemosiderin Staining. The periwound skin appearance did not exhibit: Callus, Crepitus, Excoriation, Rash, Scarring, Dry/Scaly, Maceration, Atrophie Blanche, Cyanosis, Ecchymosis, Mottled, Pallor, Rubor, Erythema. Periwound temperature was noted as No Abnormality. The periwound has tenderness on palpation. [name], [name] ([id number]) [id number] Page 8 of 10 Assessment Active Problems ICD-10 Non-pressure chronic ulcer of other part of left lower leg with fat layer exposed Other early complications of trauma, initial encounter  Chronic venous hypertension (idiopathic) with ulcer of left lower extremity Lymphedema, not elsewhere classified Paroxysmal atrial fibrillation Long term (current) use of anticoagulants Patient's wound is stable. Since the wound has stalled I recommended obtaining an ultrasound of the area to assess for an abscess and/or sinus tract especially since she is mentioning more drainage today. I also recommended a PCR culture. Currently there are no signs of soft tissue infection including increased warmth or erythema. We will also give her Tubigrip to help with swelling control. She cannot tolerate office wraps or compression garments as they slide down. She has not started the wound VAC but we will hold off on this until we get the results of the ultrasound and PCR culture. For now she can continue Dakin's wet-to-dry dressings. Plan Follow-up Appointments: Return Appointment in 2 weeks. - Tuesday with [name] 07/05/2022 Other: - hold on ordering wound vac- [employer] Wound Vac. culture taken-will call you once results return if positive will send for topical antibiotics from [facility]. someone will call you to schedule the ultrasound. use clobetasol antifungal at home around the wound. Anesthetic: (In clinic) Topical Lidocaine 5% applied to wound bed Cellular or Tissue Based Products: Wound #1  Left,Lateral Lower Leg: Cellular or Tissue Based Product Type: - 05/12/2022 Puraply AM #1 applied. 05/17/22 PURAPLY AM # 2 05/24/2022 Puraply AM #3 05/31/2022 HOLD Bathing/ Shower/ Hygiene: May shower with protection but do not get wound dressing(s) wet. Negative Presssure Wound Therapy: Other: - hold to order for now. Edema Control - Lymphedema / SCD / Other: Elevate legs to the level of the heart or above for 30 minutes daily and/or when sitting, a frequency of: - 3-4 times a day throughout the day. Avoid standing for long periods of time. Moisturize legs daily. - both legs every night before bed. Home Health: New wound care orders this week; continue Home Health for wound care. May utilize formulary equivalent dressing for wound treatment orders unless otherwise specified. - wet plain packing strips with dakins, pack wound, cover with ABD, secure with kerlix and Ace wrap. home health weekly. Other Home Health Orders/Instructions: - [facility] Laboratory ordered were: Aerobic culture-specimen not specified - culture PCR left leg wound. ordered were: ultrasound left lower leg - Ultrasound to left lower leg related to non healing ulcer to left leg looking for infection, sinus track, or abscess. CPT code 38101 WOUND #1: - Lower Leg Wound Laterality: Left, Lateral Cleanser: Wound Cleanser (Home Health) 1 x Per Day/30 Days Discharge Instructions: Cleanse the wound with wound cleanser prior to applying a clean dressing using gauze sponges, not tissue or cotton balls. Peri-Wound Care: Ketoconazole Cream 2% 1 x Per Day/30 Days Discharge Instructions: Apply to periwound in clinic. you use clobetasol at home. Prim Dressing: Plain packing strip 1/2 (in) 1 x Per Day/30 Days ary Discharge Instructions: Lightly pack as instructed Prim Dressing: Dakin's Solution 0.125%, 16 (oz) 1 x Per Day/30 Days ary Discharge Instructions: Moisten gauze with Dakin's solution Secondary Dressing: ABD Pad, 5x9 1 x  Per Day/30 Days Discharge Instructions: Apply over primary dressing as directed. Secondary Dressing: Woven Gauze Sponge, Non-Sterile 4x4 in (Home Health) 1 x Per Day/30 Days Discharge Instructions: Apply over primary dressing as directed. Secured With: [employer], 4.5x3.1 (in/yd) (Home Health) 1 x Per Day/30 Days Discharge Instructions: Secure with Kerlix as directed. Secured With: 21M Medipore H Soft Cloth Surgical T ape, 4 x 10 (in/yd) (Home Health) 1 x Per Day/30 Days Discharge Instructions: Secure with tape as  directed. Com pression Wrap: tubigrip size E 1 x Per Day/30 Days Discharge Instructions: apply in the morning and remove at night. 1. Ultrasound of the left lower extremity 2. Dakin's wet-to-dry dressings 3. PCR culture 4. Tubigrip 5. Follow-up in 2 weeks Electronic Signature(s) Signed: 06/24/2022 1:40:10 PM By: Christine Corwin DO Entered By: [name] on 06/21/2022 14:14:55 [name] ([id number]) [id number] Page 9 of 10 -------------------------------------------------------------------------------- HxROS Details Patient Name: Date of Service: [name] 06/21/2022 12:45 PM Medical Record Number: [id number] Patient Account Number: [id number] Date of Birth/Sex: Treating RN: 1951-03-08 (71 y.o. F) Primary Care Provider: [name] Other Clinician: Referring Provider: Treating Provider/Extender: [name] in Treatment: 30 Constitutional Symptoms (General Health) Medical History: Past Medical History Notes: Infarction of spleen Hematologic/Lymphatic Medical History: Past Medical History Notes: Hypothyroidism Cardiovascular Medical History: Positive for: Congestive Heart Failure; Hypertension Past Medical History Notes: A-Fib Gastrointestinal Medical History: Past Medical History Notes: Gastroesophageal reflux Genitourinary Medical History: Past Medical History  Notes: Chronic kidney disease Musculoskeletal Medical History: Past Medical History Notes: Osteoarthritis of knee Psychiatric Medical History: Past Medical History Notes: Anxiety Immunizations Pneumococcal Vaccine: Received Pneumococcal Vaccination: No Implantable Devices No devices added Hospitalization / Surgery History Type of Hospitalization/Surgery Cellulitis left leg- 01/03/2022-01/07/2022 Electronic Signature(s) Signed: 06/24/2022 1:40:10 PM By: Christine Corwin DO Entered By: [name] on 06/21/2022 14:01:51 [name] ([id number]) [id number] Page 10 of 10 -------------------------------------------------------------------------------- SuperBill Details Patient Name: Date of Service: [name] 06/21/2022 Medical Record Number: [id number] Patient Account Number: [id number] Date of Birth/Sex: Treating RN: 02-01-1951 (71 y.o. F) Primary Care Provider: [name] Other Clinician: Referring Provider: Treating Provider/Extender: [name] in Treatment: 30 Diagnosis Coding ICD-10 Codes Code Description [phone] Non-pressure chronic ulcer of other part of left lower leg with fat layer exposed T79.8XXA Other early complications of trauma, initial encounter I87.312 Chronic venous hypertension (idiopathic) with ulcer of left lower extremity I89.0 Lymphedema, not elsewhere classified I48.0 Paroxysmal atrial fibrillation Z79.01 Long term (current) use of anticoagulants Facility Procedures : CPT4 Code: [id number] Description: 99214 - WOUND CARE VISIT-LEV 4 EST PT Modifier: Quantity: 1 Physician Procedures : CPT4 Code Description Modifier [id number] - WC PHYS LEVEL 3 - EST PT ICD-10 Diagnosis Description L97.822 Non-pressure chronic ulcer of other part of left lower leg with fat layer exposed T79.8XXA Other early complications of trauma, initial  encounter I87.312 Chronic venous hypertension  (idiopathic) with ulcer of left lower extremity I89.0 Lymphedema, not elsewhere classified Quantity: 1 Electronic Signature(s) Signed: 06/22/2022 10:46:15 AM By: Shawn Stall RN, BSN Signed: 06/24/2022 1:40:10 PM By: Christine Corwin DO Entered By: Shawn Stall on 06/21/2022 14:18:34

## 2022-06-28 ENCOUNTER — Ambulatory Visit (HOSPITAL_COMMUNITY)
Admission: RE | Admit: 2022-06-28 | Discharge: 2022-06-28 | Disposition: A | Discharge status: Home / Self Care | Payer: PPO | Source: Ambulatory Visit | Attending: Internal Medicine | Admitting: Internal Medicine

## 2022-06-28 DIAGNOSIS — L97921 Non-pressure chronic ulcer of unspecified part of left lower leg limited to breakdown of skin: Secondary | ICD-10-CM

## 2022-06-30 DIAGNOSIS — Z96653 Presence of artificial knee joint, bilateral: Secondary | ICD-10-CM | POA: Diagnosis not present

## 2022-06-30 DIAGNOSIS — Z9049 Acquired absence of other specified parts of digestive tract: Secondary | ICD-10-CM | POA: Diagnosis not present

## 2022-06-30 DIAGNOSIS — E871 Hypo-osmolality and hyponatremia: Secondary | ICD-10-CM | POA: Diagnosis not present

## 2022-06-30 DIAGNOSIS — I110 Hypertensive heart disease with heart failure: Secondary | ICD-10-CM | POA: Diagnosis not present

## 2022-06-30 DIAGNOSIS — Z9181 History of falling: Secondary | ICD-10-CM | POA: Diagnosis not present

## 2022-06-30 DIAGNOSIS — L03116 Cellulitis of left lower limb: Secondary | ICD-10-CM | POA: Diagnosis not present

## 2022-06-30 DIAGNOSIS — E039 Hypothyroidism, unspecified: Secondary | ICD-10-CM | POA: Diagnosis not present

## 2022-06-30 DIAGNOSIS — J309 Allergic rhinitis, unspecified: Secondary | ICD-10-CM | POA: Diagnosis not present

## 2022-06-30 DIAGNOSIS — T24332D Burn of third degree of left lower leg, subsequent encounter: Secondary | ICD-10-CM | POA: Diagnosis not present

## 2022-06-30 DIAGNOSIS — D500 Iron deficiency anemia secondary to blood loss (chronic): Secondary | ICD-10-CM | POA: Diagnosis not present

## 2022-06-30 DIAGNOSIS — Z7901 Long term (current) use of anticoagulants: Secondary | ICD-10-CM | POA: Diagnosis not present

## 2022-06-30 DIAGNOSIS — F325 Major depressive disorder, single episode, in full remission: Secondary | ICD-10-CM | POA: Diagnosis not present

## 2022-06-30 DIAGNOSIS — I4891 Unspecified atrial fibrillation: Secondary | ICD-10-CM | POA: Diagnosis not present

## 2022-06-30 DIAGNOSIS — I5032 Chronic diastolic (congestive) heart failure: Secondary | ICD-10-CM | POA: Diagnosis not present

## 2022-06-30 DIAGNOSIS — Z6841 Body Mass Index (BMI) 40.0 and over, adult: Secondary | ICD-10-CM | POA: Diagnosis not present

## 2022-07-05 ENCOUNTER — Encounter (HOSPITAL_BASED_OUTPATIENT_CLINIC_OR_DEPARTMENT_OTHER): Payer: PPO | Attending: Internal Medicine | Admitting: Internal Medicine

## 2022-07-05 DIAGNOSIS — I480 Paroxysmal atrial fibrillation: Secondary | ICD-10-CM | POA: Diagnosis not present

## 2022-07-05 DIAGNOSIS — L97822 Non-pressure chronic ulcer of other part of left lower leg with fat layer exposed: Secondary | ICD-10-CM | POA: Insufficient documentation

## 2022-07-05 DIAGNOSIS — X58XXXA Exposure to other specified factors, initial encounter: Secondary | ICD-10-CM | POA: Diagnosis not present

## 2022-07-05 DIAGNOSIS — I87312 Chronic venous hypertension (idiopathic) with ulcer of left lower extremity: Secondary | ICD-10-CM | POA: Diagnosis not present

## 2022-07-05 DIAGNOSIS — I130 Hypertensive heart and chronic kidney disease with heart failure and stage 1 through stage 4 chronic kidney disease, or unspecified chronic kidney disease: Secondary | ICD-10-CM | POA: Diagnosis not present

## 2022-07-05 DIAGNOSIS — I5032 Chronic diastolic (congestive) heart failure: Secondary | ICD-10-CM | POA: Diagnosis not present

## 2022-07-05 DIAGNOSIS — Z7901 Long term (current) use of anticoagulants: Secondary | ICD-10-CM | POA: Diagnosis not present

## 2022-07-05 DIAGNOSIS — I890 Lymphedema, not elsewhere classified: Secondary | ICD-10-CM | POA: Diagnosis not present

## 2022-07-05 DIAGNOSIS — T798XXA Other early complications of trauma, initial encounter: Secondary | ICD-10-CM | POA: Diagnosis not present

## 2022-07-05 NOTE — Progress Notes (Signed)
[name], [name] ([id number]) [id number] Page 1 of 9 Visit Report for 07/05/2022 Chief Complaint Document Details Patient Name: Date of Service: Christine MunsonO RRIS, [name]. 07/05/2022 12:30 PM Medical Record Number: [id number] Patient Account Number: [id number] Date of Birth/Sex: Treating RN: 01/05/1951 (71 y.o. F) Primary Care Provider: [name], Christine Other Clinician: Referring Provider: Treating Provider/Extender: [name], Christine Hooley [name], Christine Weeks in Treatment: 78: 32 Information Obtained from: Patient Chief Complaint 11/19/2021; Left lower extremity wound status post fall Electronic Signature(s) Signed: 07/05/2022 2:06:49 PM By: Christine CorwinHoffman, Christine Barthold DO Entered By: Christine CorwinHoffman, Shakyia Bosso on 07/05/2022 13:38:31 -------------------------------------------------------------------------------- HPI Details Patient Name: Date of Service: Christine PaliNO RRIS, [name]. 07/05/2022 12:30 PM Medical Record Number: [id number] Patient Account Number: [id number] Date of Birth/Sex: Treating RN: 10/19/1950 (71 y.o. F) Primary Care Provider: [name], Christine Other Clinician: Referring Provider: Treating Provider/Extender: [name], Christine Penick [name], Christine Weeks in Treatment: 32 History of Present Illness HPI Description: Admission 11/19/2021 Ms. Christine PerkingGwendolyn [name] is a 71 year old female with a past medical history of paroxysmal A-fib on Eliquis, hypothyroidism, major depressive disorder, venous insufficiency and chronic diastolic heart failure that presents to the clinic for a 1 month history of nonhealing wound to the left lower extremity. She visited the ED on 10/18/2021 after a mechanical fall. She developed a hematoma that subsequently opened. She was hospitalized for 7 days and discharged on 10/25/2021. She has been on several different antibiotics For the past month. She states that most recently she was on Levaquin and linezolid for the past week. She states she completes her antibiotic course  tomorrow. She has been using Dakin's wet-to-dry dressings to the wound bed. She denies signs of infection. 4/7; patient presents for follow-up. She has been using Dakin's wet-to-dry dressings. She did end up going to the ED on 4/1 because she had excess bleeding with dressing change that she could not stop. She is on Eliquis for A-fib. In the ED they tied off a small artery. She has had no issues since discharge. She denies signs of infection. 4/14; this is a very difficult clinical situation. A patient with underlying chronic venous insufficiency and lymphedema very significant lower extremity edema had a hematoma after a fall on her left upper lateral lower leg. She is on Eliquis for atrial fibrillation apparently with a history of a splenic infarct following with Dr. Berton MountSteve [name] of cardiology. She has exhibited significant bleeding from the wound surface including ao Venous bleeder that required suturing short while ago. She has been using Dakin's wet-to-dry packing and over the surface of the wound. She saw Dr. 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 Wants to communicate with Dr. Mikey [name] when she returns. In a perfect world where she was not on Eliquis she requires a wound VAC with additional compression wraps but I understand the reluctance to do this because of the concerns of bleeding 4/28; the patient's wound actually looks better today using Dakin's wet-to-dry that she is changing twice a day she is wrapping this with Kerlix and Ace wrapping. She tells me she had 2 small bleeding areas which were part of the superficial wound that stopped this week with direct pressure. Other than that no major issues. In follow-up from discussion of last week Dr. 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 The bigger  question from my point of view is the wound VAC issue. As far as she knows and her daughter-in-law verifies that she has not had any bleeding from the deeper parts of the wound although the bleeding has been superficial including the one that sent her to the ER for stitches. She is concerned that a wound VAC would cause further bleeding and I cannot completely allay those concerns. [name], [name] ([id number]) [id number] Page 2 of 9 5/8; patient presents for follow-up. She has no issues or complaints today. She has been using Dakin's wet-to-dry dressings without issues. She denies signs of infection. 5/12; patient presents for follow-up. She continues to use Dakin's wet-to-dry dressings without issues. She denies signs of infection. She reports some issues with bleeding at times but this has improved. She denies signs of infection. 6/1; patient presents for follow-up. She was recently hospitalized for upper left leg thigh cellulitis. She was given IV cefepime and vancomycin and discharged on oral antibiotics. She has been using Dakin's wet-to-dry dressings to the left lower leg wound. She reports improvement in healing. She currently denies systemic signs of infection. 6/7; patient is using Dakin's wet-to-dry twice daily. In general this looks better than when I saw this a month or so ago however still considerable depth to the tunnel in her left leg. She is going for iron infusions ordered by her primary care doctor I believe 6/15; patient presents for follow-up. She has been using Dakin's wet-to-dry dressings. She has noted some scattered small areas that blister up and heal on her left lower leg. She has been using mupirocin ointment on them. Nothing open today. 6/23; patient's been using Dakin's wet-to-dry dressings to the tunneled wound and Hydrofera Blue to the opening. She has no issues or complaints  today. 6/29; patient presents for follow-up. She has been using Dakin's wet-to-dry dressings to the tunneled wound and Hydrofera Blue to the opening. She states that [facility] is sticking to the wound bed. She denies signs of infection. 7/7; patient presents for follow-up. She has been using Dakin's wet-to-dry dressings to the tunneled wound and PolyMem silver to the opening without issues. 7/13; patient presents for follow-up. She has been using PolyMem silver to the opening and the rope to the tunnel. She has no issues or complaints today. She denies signs of infection. 7/20; patient presents for follow-up. We have been using PolyMem silver to the wound bed. She has no issues or complaints today. She is receiving her custom compression garments tomorrow. 7/27; patient presents for follow-up. She has been using PolyMem silver to the wound bed. She is having her custom compression garments adjusted as these are not staying on very easily. 8/30; patient presents for follow-up. We have been using PolyMem silver to the wound bed. She is still waiting on her custom compression garments to be ordered. She denies signs of infection. 8/11; patient presents for follow-up. The wound VAC has been started and patient has been using this for the past week. Patient has home health who changes the wound VAC. She developed irritation to the periwound. DuoDERM was not being used to the periwound despite being ordered. 8/15; patient presents for follow-up. She restarted the wound VAC and has had improvement to the periwound with the use of DuoDERM. 8/24; patient presents for follow-up. She has been using the wound VAC. She has developed a wound just superior to the original wound. Likely as a result from the wound VAC. She denies signs of infection. 8/29; patient presents  for follow-up. We have been using collagen to the wound bed. She has held off on the wound VAC for the past week. 9/7; patient presents for  follow-up. We have been using iodoform packing to the wound bed. She has mild tenderness to the periwound. She denies systemic signs of infection. 9/12; patient presents for follow-up. Patient has been using Dakin's wet-to-dry dressings. She has no issues or complaints today. She denies signs of infection. 9/21; patient presents for follow-up. PuraPly #1 was placed in standard fashion at last clinic visit. She has no issues or complaints today. She denies signs of infection. 10/3; patient presents for follow-up. PuraPly #2 was placed in standard fashion at last clinic visit. She has no issues or complaints today. She denies signs of infection. 10/10; patient presents for follow-up. PuraPly #3 was placed in standard fashion at last clinic visit. She has no issues or complaints today. She denies signs of infection. 10/17; patient presents for follow-up. She has been using silver alginate to the tunneled wound. She has no issues or complaints today. 10/31; patient presents for follow-up. We have been doing Dakin's wet-to-dry dressings to the tunneled wound. The wound VAC has been ordered however patient has not received this. She currently denies signs of infection. She reports a lot of serosanguineous drainage. 11/14; patient presents for follow-up. She had a PCR culture done at last clinic visit that grew Pseudomonas aeruginosa. She obtained [facility] antibiotic spray yesterday. She also obtained her ultrasound of the left lower extremity to assess for abscess however the vascular lab ordered On erroneous test. This has been rectified. Patient was not charged for the test. DVT study showed no DVT T also reported to our nurse that they did not notice any abscess s. [name] while doing the DVT rule out. Electronic Signature(s) Signed: 07/05/2022 2:06:49 PM By: Christine Corwin DO Entered By: [name] on 07/05/2022  13:41:05 -------------------------------------------------------------------------------- Physical Exam Details Patient Name: Date of Service: Christine Rolling LYN [name]. 07/05/2022 12:30 PM Medical Record Number: [id number] Patient Account Number: [id number] Date of Birth/Sex: Treating RN: Dec 31, 1950 (71 y.o. F) Primary Care Provider: [name] Other Clinician: FRAYDA, [name] ([id number]) [id number] Page 3 of 9 Referring Provider: Treating Provider/Extender: Christine Rummage in Treatment: 32 Constitutional respirations regular, non-labored and within target range for patient.. Cardiovascular 2+ dorsalis pedis/posterior tibialis pulses. Psychiatric pleasant and cooperative. Notes Left lower extremity: Open wound with granulation tissue at the opening with a depth of 4 cm. No increased warmth, erythema or purulent drainage. No tenderness on palpation. Electronic Signature(s) Signed: 07/05/2022 2:06:49 PM By: Christine Corwin DO Entered By: [name] on 07/05/2022 13:41:35 -------------------------------------------------------------------------------- Physician Orders Details Patient Name: Date of Service: [name], Christine Curls LYN [name]. 07/05/2022 12:30 PM Medical Record Number: [id number] Patient Account Number: [id number] Date of Birth/Sex: Treating RN: January 23, 1951 (71 y.o. [name] Primary Care Provider: [name] Other Clinician: Referring Provider: Treating Provider/Extender: Christine Rummage in Treatment: 71 Verbal / Phone Orders: No Diagnosis Coding ICD-10 Coding Code Description [phone] Non-pressure chronic ulcer of other part of left lower leg with fat layer exposed T79.8XXA Other early complications of trauma, initial encounter I87.312 Chronic venous hypertension (idiopathic) with ulcer of left lower extremity I89.0 Lymphedema, not elsewhere classified I48.0 Paroxysmal atrial  fibrillation Z79.01 Long term (current) use of anticoagulants Follow-up Appointments ppointment in 1 week. - Dr. Mikey Bussing Return A ppointment in 2 weeks. - Dr. Mikey Bussing Return A Other: - Continue Topical antibiotic ointment. use clobetasol antifungal at  home around the wound. Anesthetic (In clinic) Topical Lidocaine 5% applied to wound bed Cellular or Tissue Based Products Wound #1 Left,Lateral Lower Leg Cellular or Tissue Based Product Type: - 05/12/2022 Puraply AM #1 applied. 05/17/22 PURAPLY AM # 2 05/24/2022 Puraply AM #3 05/31/2022 HOLD Bathing/ Shower/ Hygiene May shower with protection but do not get wound dressing(s) wet. Edema Control - Lymphedema / SCD / Other Elevate legs to the level of the heart or above for 30 minutes daily and/or when sitting, a frequency of: - 3-4 times a day throughout the day. Avoid standing for long periods of time. Moisturize legs daily. - both legs every night before bed. Home Health RONNEISHA, JETT ([id number]) [id number] Page 4 of 9 New wound care orders this week; continue Home Health for wound care. May utilize formulary equivalent dressing for wound treatment orders unless otherwise specified. - compounding topical antibiotic, pack wound, cover with ABD, secure with kerlix and Ace wrap. home health weekly. Other Home Health Orders/Instructions: - [facility] Wound Treatment Wound #1 - Lower Leg Wound Laterality: Left, Lateral Cleanser: Wound Cleanser (Home Health) 1 x Per Day/30 Days Discharge Instructions: Cleanse the wound with wound cleanser prior to applying a clean dressing using gauze sponges, not tissue or cotton balls. Peri-Wound Care: Ketoconazole Cream 2% 1 x Per Day/30 Days Discharge Instructions: Apply to periwound in clinic. you use clobetasol at home. Prim Dressing: Plain packing strip 1/2 (in) 1 x Per Day/30 Days ary Discharge Instructions: Lightly pack as instructed Prim Dressing:  Compounding topical antibiotics 1 x Per Day/30 Days ary Discharge Instructions: apply antibiotic moisted gauze lightly packed into wound bed. Secondary Dressing: ABD Pad, 5x9 1 x Per Day/30 Days Discharge Instructions: Apply over primary dressing as directed. Secondary Dressing: Woven Gauze Sponge, Non-Sterile 4x4 in (Home Health) 1 x Per Day/30 Days Discharge Instructions: Apply over primary dressing as directed. Secured With: [employer], 4.5x3.1 (in/yd) (Home Health) 1 x Per Day/30 Days Discharge Instructions: Secure with Kerlix as directed. Secured With: 98M Medipore H Soft Cloth Surgical T ape, 4 x 10 (in/yd) (Home Health) 1 x Per Day/30 Days Discharge Instructions: Secure with tape as directed. Compression Wrap: tubigrip size E 1 x Per Day/30 Days Discharge Instructions: apply in the morning and remove at night. Electronic Signature(s) Signed: 07/05/2022 2:06:49 PM By: Christine Corwin DO Entered By: [name] on 07/05/2022 13:41:44 -------------------------------------------------------------------------------- Problem List Details Patient Name: Date of Service: [name], Christine Curls LYN [name]. 07/05/2022 12:30 PM Medical Record Number: [id number] Patient Account Number: [id number] Date of Birth/Sex: Treating RN: 1950/09/17 (71 y.o. [name] Primary Care Provider: [name] Other Clinician: Referring Provider: Treating Provider/Extender: Christine Rummage in Treatment: 32 Active Problems ICD-10 Encounter Code Description Active Date MDM Diagnosis [id number] Non-pressure chronic ulcer of other part of left lower leg with fat layer exposed3/31/2023 No Yes T79.8XXA Other early complications of trauma, initial encounter 11/19/2021 No Yes I87.312 Chronic venous hypertension (idiopathic) with ulcer of left lower extremity 04/28/2022 No Yes I89.0 Lymphedema, not elsewhere classified 04/28/2022 No Yes [name], [name] ([id number])  [id number] Page 5 of 9 I48.0 Paroxysmal atrial fibrillation 11/19/2021 No Yes Z79.01 Long term (current) use of anticoagulants 11/19/2021 No Yes Inactive Problems Resolved Problems Electronic Signature(s) Signed: 07/05/2022 2:06:49 PM By: Christine Corwin DO Entered By: [name] on 07/05/2022 13:38:15 -------------------------------------------------------------------------------- Progress Note Details Patient Name: Date of Service: Christine Rolling LYN [name]. 07/05/2022 12:30 PM Medical Record Number: [id number] Patient Account Number: [id number] Date of Birth/Sex: Treating  RN: December 09, 1950 (71 y.o. F) Primary Care Provider: [name] Other Clinician: Referring Provider: Treating Provider/Extender: Christine Rummage in Treatment: 32 Subjective Chief Complaint Information obtained from Patient 11/19/2021; Left lower extremity wound status post fall History of Present Illness (HPI) Admission 11/19/2021 Ms. Taelyr Jantz is a 71 year old female with a past medical history of paroxysmal A-fib on Eliquis, hypothyroidism, major depressive disorder, venous insufficiency and chronic diastolic heart failure that presents to the clinic for a 1 month history of nonhealing wound to the left lower extremity. She visited the ED on 10/18/2021 after a mechanical fall. She developed a hematoma that subsequently opened. She was hospitalized for 7 days and discharged on 10/25/2021. She has been on several different antibiotics For the past month. She states that most recently she was on Levaquin and linezolid for the past week. She states she completes her antibiotic course tomorrow. She has been using Dakin's wet-to-dry dressings to the wound bed. She denies signs of infection. 4/7; patient presents for follow-up. She has been using Dakin's wet-to-dry dressings. She did end up going to the ED on 4/1 because she had excess bleeding with dressing change that  she could not stop. She is on Eliquis for A-fib. In the ED they tied off a small artery. She has had no issues since discharge. She denies signs of infection. 4/14; this is a very difficult clinical situation. A patient with underlying chronic venous insufficiency and lymphedema very significant lower extremity edema had a hematoma after a fall on her left upper lateral lower leg. She is on Eliquis for atrial fibrillation apparently with a history of a splenic infarct following with Dr. Berton Mount of cardiology. She has exhibited significant bleeding from the wound surface including ao Venous bleeder that required suturing short while ago. She has been using Dakin's wet-to-dry packing and over the surface of the wound. She saw Dr. Graciela Husbands yesterday he is reluctant to consider stopping the Eliquis because of the prior history of presumed cardioembolism. Wants to communicate with Dr. Mikey Bussing when she returns. In a perfect world where she was not on Eliquis she requires a wound VAC with additional compression wraps but I understand the reluctance to do this because of the concerns of bleeding 4/28; the patient's wound actually looks better today using Dakin's wet-to-dry that she is changing twice a day she is wrapping this with Kerlix and Ace wrapping. She tells me she had 2 small bleeding areas which were part of the superficial wound that stopped this week with direct pressure. Other than that no major issues. In follow-up from discussion of last week Dr. 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 The bigger question from my point of view is the wound VAC issue. As far as she knows and her daughter-in-law verifies that she has not had any bleeding from the deeper parts of the wound although the bleeding has been superficial including the  one that sent her to the ER for stitches. She is concerned that a wound VAC would cause further bleeding and I cannot completely allay those concerns. 5/8; patient presents for follow-up. She has no issues or complaints today. She has been using Dakin's wet-to-dry dressings without issues. She denies signs of infection. 5/12; patient presents for follow-up. She continues to use Dakin's wet-to-dry dressings without issues. She denies  signs of infection. She reports some issues with bleeding at times but this has improved. She denies signs of infection. 6/1; patient presents for follow-up. She was recently hospitalized for upper left leg thigh cellulitis. She was given IV cefepime and vancomycin and discharged [name], [name] ([id number]) [id number] Page 6 of 9 on oral antibiotics. She has been using Dakin's wet-to-dry dressings to the left lower leg wound. She reports improvement in healing. She currently denies systemic signs of infection. 6/7; patient is using Dakin's wet-to-dry twice daily. In general this looks better than when I saw this a month or so ago however still considerable depth to the tunnel in her left leg. She is going for iron infusions ordered by her primary care doctor I believe 6/15; patient presents for follow-up. She has been using Dakin's wet-to-dry dressings. She has noted some scattered small areas that blister up and heal on her left lower leg. She has been using mupirocin ointment on them. Nothing open today. 6/23; patient's been using Dakin's wet-to-dry dressings to the tunneled wound and Hydrofera Blue to the opening. She has no issues or complaints today. 6/29; patient presents for follow-up. She has been using Dakin's wet-to-dry dressings to the tunneled wound and Hydrofera Blue to the opening. She states that [facility] is sticking to the wound bed. She denies signs of infection. 7/7; patient presents for follow-up. She has been  using Dakin's wet-to-dry dressings to the tunneled wound and PolyMem silver to the opening without issues. 7/13; patient presents for follow-up. She has been using PolyMem silver to the opening and the rope to the tunnel. She has no issues or complaints today. She denies signs of infection. 7/20; patient presents for follow-up. We have been using PolyMem silver to the wound bed. She has no issues or complaints today. She is receiving her custom compression garments tomorrow. 7/27; patient presents for follow-up. She has been using PolyMem silver to the wound bed. She is having her custom compression garments adjusted as these are not staying on very easily. 8/30; patient presents for follow-up. We have been using PolyMem silver to the wound bed. She is still waiting on her custom compression garments to be ordered. She denies signs of infection. 8/11; patient presents for follow-up. The wound VAC has been started and patient has been using this for the past week. Patient has home health who changes the wound VAC. She developed irritation to the periwound. DuoDERM was not being used to the periwound despite being ordered. 8/15; patient presents for follow-up. She restarted the wound VAC and has had improvement to the periwound with the use of DuoDERM. 8/24; patient presents for follow-up. She has been using the wound VAC. She has developed a wound just superior to the original wound. Likely as a result from the wound VAC. She denies signs of infection. 8/29; patient presents for follow-up. We have been using collagen to the wound bed. She has held off on the wound VAC for the past week. 9/7; patient presents for follow-up. We have been using iodoform packing to the wound bed. She has mild tenderness to the periwound. She denies systemic signs of infection. 9/12; patient presents for follow-up. Patient has been using Dakin's wet-to-dry dressings. She has no issues or complaints today. She denies signs  of infection. 9/21; patient presents for follow-up. PuraPly #1 was placed in standard fashion at last clinic visit. She has no issues or complaints today. She denies signs of infection. 10/3; patient presents for follow-up. PuraPly #2  was placed in standard fashion at last clinic visit. She has no issues or complaints today. She denies signs of infection. 10/10; patient presents for follow-up. PuraPly #3 was placed in standard fashion at last clinic visit. She has no issues or complaints today. She denies signs of infection. 10/17; patient presents for follow-up. She has been using silver alginate to the tunneled wound. She has no issues or complaints today. 10/31; patient presents for follow-up. We have been doing Dakin's wet-to-dry dressings to the tunneled wound. The wound VAC has been ordered however patient has not received this. She currently denies signs of infection. She reports a lot of serosanguineous drainage. 11/14; patient presents for follow-up. She had a PCR culture done at last clinic visit that grew Pseudomonas aeruginosa. She obtained Keystone antibiotic spray yesterday. She also obtained her ultrasound of the left lower extremity to assess for abscess however the vascular lab ordered On erroneous test. This has been rectified. Patient was not charged for the test. DVT study showed no DVT T also reported to our nurse that they did not notice any abscess s. [name] while doing the DVT rule out. Patient History Medical History Cardiovascular Patient has history of Congestive Heart Failure, Hypertension Hospitalization/Surgery History - Cellulitis left leg- 01/03/2022-01/07/2022. Medical A Surgical History Notes nd Constitutional Symptoms (General Health) Infarction of spleen Hematologic/Lymphatic Hypothyroidism Cardiovascular A-Fib Gastrointestinal Gastroesophageal reflux Genitourinary Chronic kidney disease Musculoskeletal Osteoarthritis of  knee Psychiatric Anxiety [name], [name] ([id number]) [id number] Page 7 of 9 Objective Constitutional respirations regular, non-labored and within target range for patient.. Vitals Time Taken: 12:39 PM, Height: 67 in, Weight: 340 lbs, BMI: 53.2, Temperature: 97.8 F, Pulse: 74 bpm, Respiratory Rate: 17 breaths/min, Blood Pressure: 182/93 mmHg. Cardiovascular 2+ dorsalis pedis/posterior tibialis pulses. Psychiatric pleasant and cooperative. General Notes: Left lower extremity: Open wound with granulation tissue at the opening with a depth of 4 cm. No increased warmth, erythema or purulent drainage. No tenderness on palpation. Integumentary (Hair, Skin) Wound #1 status is Open. Original cause of wound was Trauma. The date acquired was: 10/18/2021. The wound has been in treatment 32 weeks. The wound is located on the Left,Lateral Lower Leg. The wound measures 0.3cm length x 0.3cm width x 1.2cm depth; 0.071cm^2 area and 0.085cm^3 volume. There is Fat Layer (Subcutaneous Tissue) exposed. There is no undermining noted, however, there is tunneling at 12:00 with a maximum distance of 4cm. There is a medium amount of serosanguineous drainage noted. The wound margin is distinct with the outline attached to the wound base. There is large (67-100%) red, friable granulation within the wound bed. There is no necrotic tissue within the wound bed. The periwound skin appearance exhibited: Induration, Hemosiderin Staining. The periwound skin appearance did not exhibit: Callus, Crepitus, Excoriation, Rash, Scarring, Dry/Scaly, Maceration, Atrophie Blanche, Cyanosis, Ecchymosis, Mottled, Pallor, Rubor, Erythema. Periwound temperature was noted as No Abnormality. The periwound has tenderness on palpation. Assessment Active Problems ICD-10 Non-pressure chronic ulcer of other part of left lower leg with fat layer exposed Other early complications of trauma, initial  encounter Chronic venous hypertension (idiopathic) with ulcer of left lower extremity Lymphedema, not elsewhere classified Paroxysmal atrial fibrillation Long term (current) use of anticoagulants Patient obtained her Keystone antibiotic spray yesterday. We will continue this. No abscess was noted on the DVT rule out. While this was an erroneous test done I do not think we need to proceed with the soft tissue ultrasound for now. We will see how she does with [facility] antibiotic spray. Continue Ace wrap. Our  in office wraps and Tubigrip slide. Even her custom compression garments would not stay up. Follow-up in 1 week. Patient's wound is stable. She grew Pseudomonas on PCR culture and she received her Keystone antibiotic spray yesterday. Plan Follow-up Appointments: Return Appointment in 1 week. - Dr. Mikey Bussing Return Appointment in 2 weeks. - Dr. Mikey Bussing Other: - Continue Topical antibiotic ointment. use clobetasol antifungal at home around the wound. Anesthetic: (In clinic) Topical Lidocaine 5% applied to wound bed Cellular or Tissue Based Products: Wound #1 Left,Lateral Lower Leg: Cellular or Tissue Based Product Type: - 05/12/2022 Puraply AM #1 applied. 05/17/22 PURAPLY AM # 2 05/24/2022 Puraply AM #3 05/31/2022 HOLD Bathing/ Shower/ Hygiene: May shower with protection but do not get wound dressing(s) wet. Edema Control - Lymphedema / SCD / Other: Elevate legs to the level of the heart or above for 30 minutes daily and/or when sitting, a frequency of: - 3-4 times a day throughout the day. Avoid standing for long periods of time. Moisturize legs daily. - both legs every night before bed. Home Health: New wound care orders this week; continue Home Health for wound care. May utilize formulary equivalent dressing for wound treatment orders unless otherwise specified. - compounding topical antibiotic, pack wound, cover with ABD, secure with kerlix and Ace wrap. home health weekly. Other Home  Health Orders/Instructions: - [facility] WOUND #1: - Lower Leg Wound Laterality: Left, Lateral Cleanser: Wound Cleanser (Home Health) 1 x Per Day/30 Days Discharge Instructions: Cleanse the wound with wound cleanser prior to applying a clean dressing using gauze sponges, not tissue or cotton balls. Peri-Wound Care: Ketoconazole Cream 2% 1 x Per Day/30 Days Discharge Instructions: Apply to periwound in clinic. you use clobetasol at home. Prim Dressing: Plain packing strip 1/2 (in) 1 x Per Day/30 Days ary Discharge Instructions: Lightly pack as instructed Prim Dressing: Compounding topical antibiotics 1 x Per Day/30 Days ary Discharge Instructions: apply antibiotic moisted gauze lightly packed into wound bed. TONEY, LIZAOLA ([id number]) [id number] Page 8 of 9 Secondary Dressing: ABD Pad, 5x9 1 x Per Day/30 Days Discharge Instructions: Apply over primary dressing as directed. Secondary Dressing: Woven Gauze Sponge, Non-Sterile 4x4 in (Home Health) 1 x Per Day/30 Days Discharge Instructions: Apply over primary dressing as directed. Secured With: [employer], 4.5x3.1 (in/yd) (Home Health) 1 x Per Day/30 Days Discharge Instructions: Secure with Kerlix as directed. Secured With: 27M Medipore H Soft Cloth Surgical T ape, 4 x 10 (in/yd) (Home Health) 1 x Per Day/30 Days Discharge Instructions: Secure with tape as directed. Compression Wrap: tubigrip size E 1 x Per Day/30 Days Discharge Instructions: apply in the morning and remove at night. 1. Keystone antibiotic spray daily 2. Follow-up in 1 week Electronic Signature(s) Signed: 07/05/2022 2:06:49 PM By: Christine Corwin DO Entered By: [name] on 07/05/2022 13:43:55 -------------------------------------------------------------------------------- HxROS Details Patient Name: Date of Service: [name], [name]. 07/05/2022 12:30 PM Medical Record Number: [id number] Patient Account Number:  [id number] Date of Birth/Sex: Treating RN: 1951/04/23 (71 y.o. F) Primary Care Provider: [name] Other Clinician: Referring Provider: Treating Provider/Extender: Christine Rummage in Treatment: 32 Constitutional Symptoms (General Health) Medical History: Past Medical History Notes: Infarction of spleen Hematologic/Lymphatic Medical History: Past Medical History Notes: Hypothyroidism Cardiovascular Medical History: Positive for: Congestive Heart Failure; Hypertension Past Medical History Notes: A-Fib Gastrointestinal Medical History: Past Medical History Notes: Gastroesophageal reflux Genitourinary Medical History: Past Medical History Notes: Chronic kidney disease Musculoskeletal Medical History: Past Medical History Notes: Osteoarthritis of knee Psychiatric  Medical History: Past Medical History Notes: Anxiety VERNECIA, UMBLE ([id number]) [id number] Page 9 of 9 Immunizations Pneumococcal Vaccine: Received Pneumococcal Vaccination: No Implantable Devices No devices added Hospitalization / Surgery History Type of Hospitalization/Surgery Cellulitis left leg- 01/03/2022-01/07/2022 Electronic Signature(s) Signed: 07/05/2022 2:06:49 PM By: Christine Corwin DO Entered By: [name] on 07/05/2022 13:41:15 -------------------------------------------------------------------------------- SuperBill Details Patient Name: Date of Service: Christine Rolling LYN [name]. 07/05/2022 Medical Record Number: [id number] Patient Account Number: [id number] Date of Birth/Sex: Treating RN: [birth year] (71 y.o. [name] Primary Care Provider: [name] Other Clinician: Referring Provider: Treating Provider/Extender: Christine Rummage in Treatment: 32 Diagnosis Coding ICD-10 Codes Code Description [phone] Non-pressure chronic ulcer of other part of left lower leg with fat layer exposed T79.8XXA Other  early complications of trauma, initial encounter I87.312 Chronic venous hypertension (idiopathic) with ulcer of left lower extremity I89.0 Lymphedema, not elsewhere classified I48.0 Paroxysmal atrial fibrillation Z79.01 Long term (current) use of anticoagulants Facility Procedures : CPT4 Code: [id number] Description: 99213 - WOUND CARE VISIT-LEV 3 EST PT Modifier: Quantity: 1 Physician Procedures : CPT4 Code Description Modifier [id number] 99213 - WC PHYS LEVEL 3 - EST PT ICD-10 Diagnosis Description L97.822 Non-pressure chronic ulcer of other part of left lower leg with fat layer exposed T79.8XXA Other early complications of trauma, initial  encounter I87.312 Chronic venous hypertension (idiopathic) with ulcer of left lower extremity I89.0 Lymphedema, not elsewhere classified Quantity: 1 Electronic Signature(s) Signed: 07/05/2022 2:06:49 PM By: Christine Corwin DO Entered By: [name] on 07/05/2022 13:44:11

## 2022-07-07 DIAGNOSIS — J309 Allergic rhinitis, unspecified: Secondary | ICD-10-CM | POA: Diagnosis not present

## 2022-07-07 DIAGNOSIS — F325 Major depressive disorder, single episode, in full remission: Secondary | ICD-10-CM | POA: Diagnosis not present

## 2022-07-07 DIAGNOSIS — Z9181 History of falling: Secondary | ICD-10-CM | POA: Diagnosis not present

## 2022-07-07 DIAGNOSIS — I110 Hypertensive heart disease with heart failure: Secondary | ICD-10-CM | POA: Diagnosis not present

## 2022-07-07 DIAGNOSIS — L03116 Cellulitis of left lower limb: Secondary | ICD-10-CM | POA: Diagnosis not present

## 2022-07-07 DIAGNOSIS — T24332D Burn of third degree of left lower leg, subsequent encounter: Secondary | ICD-10-CM | POA: Diagnosis not present

## 2022-07-07 DIAGNOSIS — I5032 Chronic diastolic (congestive) heart failure: Secondary | ICD-10-CM | POA: Diagnosis not present

## 2022-07-07 DIAGNOSIS — Z6841 Body Mass Index (BMI) 40.0 and over, adult: Secondary | ICD-10-CM | POA: Diagnosis not present

## 2022-07-07 DIAGNOSIS — Z7901 Long term (current) use of anticoagulants: Secondary | ICD-10-CM | POA: Diagnosis not present

## 2022-07-07 DIAGNOSIS — Z96653 Presence of artificial knee joint, bilateral: Secondary | ICD-10-CM | POA: Diagnosis not present

## 2022-07-07 DIAGNOSIS — E039 Hypothyroidism, unspecified: Secondary | ICD-10-CM | POA: Diagnosis not present

## 2022-07-07 DIAGNOSIS — Z9049 Acquired absence of other specified parts of digestive tract: Secondary | ICD-10-CM | POA: Diagnosis not present

## 2022-07-07 DIAGNOSIS — I4891 Unspecified atrial fibrillation: Secondary | ICD-10-CM | POA: Diagnosis not present

## 2022-07-07 DIAGNOSIS — D500 Iron deficiency anemia secondary to blood loss (chronic): Secondary | ICD-10-CM | POA: Diagnosis not present

## 2022-07-07 DIAGNOSIS — E871 Hypo-osmolality and hyponatremia: Secondary | ICD-10-CM | POA: Diagnosis not present

## 2022-07-08 NOTE — Progress Notes (Signed)
[name] ([id number]) [id number] Page 1 of 10 Visit Report for 07/05/2022 Arrival Information Details Patient Name: Date of Service: [name] 07/05/2022 12:30 PM Medical Record Number: [id number] Patient Account Number: [id number] Date of Birth/Sex: Treating RN: 08/10/1951 (71 y.o. [name], [name] Primary Care [name]: [name] Other Clinician: Referring [name]: Treating [name]/Extender: Judie Grieve in Treatment: 24 Visit Information History Since Last Visit Added or deleted any medications: No Patient Arrived: [name] Any new allergies or adverse reactions: No Arrival Time: 12:37 Had a fall or experienced change in No Accompanied By: self activities of daily living that may affect Transfer Assistance: None risk of falls: Patient Identification Verified: Yes Signs or symptoms of abuse/neglect since last visito No Secondary Verification Process Completed: Yes Hospitalized since last visit: No Patient Requires Transmission-Based Precautions: No Implantable device outside of the clinic excluding No Patient Has Alerts: Yes cellular tissue based products placed in the center Patient Alerts: Patient on Blood Thinner since last visit: Has Dressing in Place as Prescribed: Yes Pain Present Now: No Electronic Signature(s) Signed: 07/08/2022 12:09:11 PM By: Rhae Hammock RN Entered By: Rhae Hammock on 07/05/2022 12:37:59 -------------------------------------------------------------------------------- Clinic Level of Care Assessment Details Patient Name: Date of Service: [name] 07/05/2022 12:30 PM Medical Record Number: [id number] Patient Account Number: [id number] Date of Birth/Sex: Treating RN: 05/12/1951 (71 y.o. [name] Primary Care [name]: [name] Other Clinician: Referring [name]: Treating [name]/Extender: Judie Grieve in  Treatment: 32 Clinic Level of Care Assessment Items TOOL 4 Quantity Score X- 1 0 Use when only an EandM is performed on FOLLOW-UP visit ASSESSMENTS - Nursing Assessment / Reassessment X- 1 10 Reassessment of Co-morbidities (includes updates in patient status) X- 1 5 Reassessment of Adherence to Treatment Plan ASSESSMENTS - Wound and Skin A ssessment / Reassessment X - Simple Wound Assessment / Reassessment - one wound 1 5 _0  - 0 Complex Wound Assessment / Reassessment - multiple wounds X- 1 10 Dermatologic / Skin Assessment (not related to wound area) ASSESSMENTS - Focused Assessment _1  - 0 Circumferential Edema Measurements - multi extremities _2  - 0 Nutritional Assessment / Counseling / Intervention [name], [name] ([id number]) [id number] Page 2 of 10 _3  - 0 Lower Extremity Assessment (monofilament, tuning fork, pulses) _4  - 0 Peripheral Arterial Disease Assessment (using hand held doppler) ASSESSMENTS - Ostomy and/or Continence Assessment and Care _5  - 0 Incontinence Assessment and Management _6  - 0 Ostomy Care Assessment and Management (repouching, etc.) PROCESS - Coordination of Care X - Simple Patient / Family Education for ongoing care 1 15 _7  - 0 Complex (extensive) Patient / Family Education for ongoing care X- 1 10 Staff obtains Programmer, systems, Records, T Results / Process Orders est X- 1 10 Staff telephones HHA, Nursing Homes / Clarify orders / etc _8  - 0 Routine Transfer to another Facility (non-emergent condition) _9  - 0 Routine Hospital Admission (non-emergent condition) _10  - 0 New Admissions / Biomedical engineer / Ordering NPWT Apligraf, etc. , _11  - 0 Emergency Hospital Admission (emergent condition) X- 1 10 Simple Discharge Coordination _12  - 0 Complex (extensive) Discharge Coordination PROCESS - Special Needs _13  - 0 Pediatric / Minor Patient Management _14  - 0 Isolation Patient Management _15  - 0 Hearing / Language  / Visual special needs _16  - 0 Assessment of Community assistance (transportation, D/C planning, etc.) _17  - 0 Additional assistance / Altered mentation _18  - 0 Support Surface(s) Assessment (bed, cushion, seat, etc.) INTERVENTIONS - Wound Cleansing /  Measurement X - Simple Wound Cleansing - one wound 1 5 _0  - 0 Complex Wound Cleansing - multiple wounds X- 1 5 Wound Imaging (photographs - any number of wounds) _1  - 0 Wound Tracing (instead of photographs) X- 1 5 Simple Wound Measurement - one wound _2  - 0 Complex Wound Measurement - multiple wounds INTERVENTIONS - Wound Dressings X - Small Wound Dressing one or multiple wounds 1 10 _3  - 0 Medium Wound Dressing one or multiple wounds _4  - 0 Large Wound Dressing one or multiple wounds <PPIRJJOACZYSAYTK>_1<\/SWFUXNATFTDDUKGU>_5  - 0 Application of Medications - topical <KYHCWCBJSEGBTDVV>_6<\/HYWVPXTGGYIRSWNI>_6  - 0 Application of Medications - injection INTERVENTIONS - Miscellaneous _7  - 0 External ear exam _8  - 0 Specimen Collection (cultures, biopsies, blood, body fluids, etc.) _9  - 0 Specimen(s) / Culture(s) sent or taken to Lab for analysis _10  - 0 Patient Transfer (multiple staff / Civil Service fast streamer / Similar devices) _11  - 0 Simple Staple / Suture removal (25 or less) _12  - 0 Complex Staple / Suture removal (26 or more) _13  - 0 Hypo / Hyperglycemic Management (close monitor of Blood Glucose) ALEEYA, VEITCH ([id number]) [id number] Page 3 of 10 _14  - 0 Ankle / Brachial Index (ABI) - do not check if billed separately X- 1 5 Vital Signs Has the patient been seen at the hospital within the last three years: Yes Total Score: 105 Level Of Care: New/Established - Level 3 Electronic Signature(s) Signed: 07/05/2022 5:53:09 PM By: Deon Pilling RN, BSN Entered By: Deon Pilling on 07/05/2022 13:17:18 -------------------------------------------------------------------------------- Encounter Discharge Information Details Patient Name: Date of Service: Christine Debar [name]  07/05/2022 12:30 PM Medical Record Number: [id number] Patient Account Number: [id number] Date of Birth/Sex: Treating RN: 12-05-1950 (71 y.o. [name] Primary Care Zailee Vallely: [name] Other Clinician: Referring Carolynn Tuley: Treating Jasmeet Manton/Extender: Judie Grieve in Treatment: 32 Encounter Discharge Information Items Discharge Condition: Stable Ambulatory Status: Walker Discharge Destination: Home Transportation: Private Auto Accompanied By: self Schedule Follow-up Appointment: Yes Clinical Summary of Care: Electronic Signature(s) Signed: 07/05/2022 5:53:09 PM By: Deon Pilling RN, BSN Entered By: Deon Pilling on 07/05/2022 13:17:48 -------------------------------------------------------------------------------- Lower Extremity Assessment Details Patient Name: Date of Service: Christine Debar [name] 07/05/2022 12:30 PM Medical Record Number: [id number] Patient Account Number: [id number] Date of Birth/Sex: Treating RN: 10/08/1950 (71 y.o. [name], [name] Primary Care Yeiden Frenkel: [name] Other Clinician: Referring Dionne Rossa: Treating Joline Encalada/Extender: Judie Grieve in Treatment: 32 Edema Assessment Assessed: Shirlyn Goltz: Yes] Patrice Paradise: No] Edema: [Left: Ye] [Right: s] Calf Left: Right: Point of Measurement: 31 cm From Medial Instep 47 cm Ankle Left: Right: Point of Measurement: 9 cm From Medial Instep 25 cm Vascular Assessment KENDRAH, LOVERN ([id number]) [Right:[id number] Page 4 of 10] Pulses: Dorsalis Pedis Palpable: [Left:Yes] Posterior Tibial Palpable: [Left:Yes] Electronic Signature(s) Signed: 07/08/2022 12:09:11 PM By: Rhae Hammock RN Entered By: Rhae Hammock on 07/05/2022 12:39:32 -------------------------------------------------------------------------------- Multi Wound Chart Details Patient Name: Date of Service: Christine Debar [name] 07/05/2022 12:30 PM Medical  Record Number: [id number] Patient Account Number: [id number] Date of Birth/Sex: Treating RN: 1950/09/02 (71 y.o. F) Primary Care Jannelly Bergren: [name] Other Clinician: Referring Camauri Craton: Treating Ronetta Molla/Extender: Judie Grieve in Treatment: 32 Vital Signs Height(in): 67 Pulse(bpm): 74 Weight(lbs): 340 Blood Pressure(mmHg): 182/93 Body Mass Index(BMI): 53.2 Temperature(F): 97.8 Respiratory Rate(breaths/min): 17 [1:Photos:] [N/A:N/A] Left, Lateral Lower Leg N/A N/A Wound Location: Trauma N/A N/A Wounding Event: Trauma, Other N/A N/A Primary Etiology: Congestive Heart Failure, N/A N/A Comorbid History: Hypertension 10/18/2021 N/A N/A Date  Acquired: 32 N/A N/A Weeks of Treatment: Open N/A N/A Wound Status: No N/A N/A Wound Recurrence: Yes N/A N/A Clustered Wound: 1 N/A N/A Clustered Quantity: 0.3x0.3x1.2 N/A N/A Measurements L x W x D (cm) 0.071 N/A N/A A (cm) : rea 0.085 N/A N/A Volume (cm) : 100.00% N/A N/A % Reduction in A rea: 100.00% N/A N/A % Reduction in Volume: 12 Position 1 (o'clock): 4 Maximum Distance 1 (cm): Yes N/A N/A Tunneling: Full Thickness With Exposed Support N/A N/A Classification: Structures Medium N/A N/A Exudate Amount: Serosanguineous N/A N/A Exudate Type: red, brown N/A N/A Exudate Color: Distinct, outline attached N/A N/A Wound Margin: Large (67-100%) N/A N/A Granulation Amount: Red, Friable N/A N/A Granulation Quality: None Present (0%) N/A N/A Necrotic Amount: Fat Layer (Subcutaneous Tissue): Yes N/A N/A Exposed Structures: Fascia: No Tendon: No Muscle: No Joint: No [name], [name] ([id number]) [id number] Page 5 of 10 Bone: No Large (67-100%) N/A N/A Epithelialization: Induration: Yes N/A N/A Periwound Skin Texture: Excoriation: No Callus: No Crepitus: No Rash: No Scarring: No Maceration: No N/A N/A Periwound Skin Moisture: Dry/Scaly:  No Hemosiderin Staining: Yes N/A N/A Periwound Skin Color: Atrophie Blanche: No Cyanosis: No Ecchymosis: No Erythema: No Mottled: No Pallor: No Rubor: No No Abnormality N/A N/A Temperature: Yes N/A N/A Tenderness on Palpation: Treatment Notes Wound #1 (Lower Leg) Wound Laterality: Left, Lateral Cleanser Wound Cleanser Discharge Instruction: Cleanse the wound with wound cleanser prior to applying a clean dressing using gauze sponges, not tissue or cotton balls. Peri-Wound Care Ketoconazole Cream 2% Discharge Instruction: Apply to periwound in clinic. you use clobetasol at home. Topical Primary Dressing Plain packing strip 1/2 (in) Discharge Instruction: Lightly pack as instructed Compounding topical antibiotics Discharge Instruction: apply antibiotic moisted gauze lightly packed into wound bed. Secondary Dressing ABD Pad, 5x9 Discharge Instruction: Apply over primary dressing as directed. Woven Gauze Sponge, Non-Sterile 4x4 in Discharge Instruction: Apply over primary dressing as directed. Secured With [employer], 4.5x3.1 (in/yd) Discharge Instruction: Secure with Kerlix as directed. 11M Medipore H Soft Cloth Surgical T ape, 4 x 10 (in/yd) Discharge Instruction: Secure with tape as directed. Compression Wrap tubigrip size E Discharge Instruction: apply in the morning and remove at night. Compression Stockings Add-Ons Electronic Signature(s) Signed: 07/05/2022 2:06:49 PM By: Kalman Shan DO Entered By: Kalman Shan on 07/05/2022 13:38:21 Multi-Disciplinary Care Plan Details -------------------------------------------------------------------------------- [name] ([id number]) [id number] Page 6 of 10 Patient Name: Date of Service: [name] 07/05/2022 12:30 PM Medical Record Number: [id number] Patient Account Number: [id number] Date of Birth/Sex: Treating RN: May 08, 1951 (71 y.o. [name], Meta.Reding Primary  Care Jaun Galluzzo: [name] Other Clinician: Referring Timaya Bojarski: Treating Liisa Picone/Extender: Judie Grieve in Treatment: 5 Active Inactive Abuse / Safety / Falls / Self Care Management Nursing Diagnoses: History of Falls Potential for falls Goals: Patient/caregiver will verbalize/demonstrate measure taken to improve self care Date Initiated: 11/19/2021 Date Inactivated: 04/05/2022 Target Resolution Date: 04/21/2022 Goal Status: Met Patient/caregiver will verbalize/demonstrate measures taken to prevent injury and/or falls Date Initiated: 11/19/2021 Target Resolution Date: 07/22/2022 Goal Status: Active Interventions: Provide education on basic hygiene Provide education on fall prevention Provide education on HBO safety Provide education on vaccinations Treatment Activities: Education provided on Basic Hygiene : 05/17/2022 Notes: Pain, Acute or Chronic Nursing Diagnoses: Pain, acute or chronic: actual or potential Potential alteration in comfort, pain Goals: Patient will verbalize adequate pain control and receive pain control interventions during procedures as needed Date Initiated: 11/19/2021 Date Inactivated: 04/05/2022 Target Resolution Date: 04/22/2022  Goal Status: Met Patient/caregiver will verbalize comfort level met Date Initiated: 11/19/2021 Target Resolution Date: 07/22/2022 Goal Status: Active Interventions: Encourage patient to take pain medications as prescribed Provide education on pain management Reposition patient for comfort Treatment Activities: Administer pain control measures as ordered : 11/19/2021 Notes: Electronic Signature(s) Signed: 07/05/2022 5:53:09 PM By: Deon Pilling RN, BSN Entered By: Deon Pilling on 07/05/2022 13:12:20 -------------------------------------------------------------------------------- Pain Assessment Details Patient Name: Date of Service: Christine Debar [name] 07/05/2022 12:30 PM Medical Record Number:  [id number] Patient Account Number: [id number] [name], [name] ([id number]) [id number] Page 7 of 10 Date of Birth/Sex: Treating RN: August 18, 1951 (71 y.o. [name], [name] Primary Care Arien Morine: [name] Other Clinician: Referring Christofer Shen: Treating Daviana Haymaker/Extender: Judie Grieve in Treatment: 32 Active Problems Location of Pain Severity and Description of Pain Patient Has Paino No Site Locations Pain Management and Medication Current Pain Management: Electronic Signature(s) Signed: 07/08/2022 12:09:11 PM By: Rhae Hammock RN Entered By: Rhae Hammock on 07/05/2022 12:39:26 -------------------------------------------------------------------------------- Patient/Caregiver Education Details Patient Name: Date of Service: [name] 11/14/2023andnbsp12:30 PM Medical Record Number: [id number] Patient Account Number: [id number] Date of Birth/Gender: Treating RN: 1951-08-04 (71 y.o. [name] Primary Care Physician: [name] Other Clinician: Referring Physician: Treating Physician/Extender: Judie Grieve in Treatment: 32 Education Assessment Education Provided To: Patient Education Topics Provided Pain: Handouts: A Guide to Pain Control Methods: Explain/Verbal Responses: Reinforcements needed Electronic Signature(s) Signed: 07/05/2022 5:53:09 PM By: Deon Pilling RN, BSN Entered By: Deon Pilling on 07/05/2022 13:12:31 [name] ([id number]) [id number] Page 8 of 10 -------------------------------------------------------------------------------- Wound Assessment Details Patient Name: Date of Service: [name] 07/05/2022 12:30 PM Medical Record Number: [id number] Patient Account Number: [id number] Date of Birth/Sex: Treating RN: 01/24/1951 (71 y.o. [name], [name] Primary Care Jerrell Mangel: [name] Other  Clinician: Referring Rayansh Herbst: Treating Karne Ozga/Extender: Judie Grieve in Treatment: 32 Wound Status Wound Number: 1 Primary Etiology: Trauma, Other Wound Location: Left, Lateral Lower Leg Wound Status: Open Wounding Event: Trauma Comorbid History: Congestive Heart Failure, Hypertension Date Acquired: 10/18/2021 Weeks Of Treatment: 32 Clustered Wound: Yes Photos Wound Measurements Length: (cm) Width: (cm) Depth: (cm) Clustered Quantity: Area: (cm) Volume: (cm) 0.3 % Reduction in Area: 100% 0.3 % Reduction in Volume: 100% 1.2 Epithelialization: Large (67-100%) 1 Tunneling: Yes 0.071 Position (o'clock): 12 0.085 Maximum Distance: (cm) 4 Undermining: No Wound Description Classification: Full Thickness With Exposed Suppor Wound Margin: Distinct, outline attached Exudate Amount: Medium Exudate Type: Serosanguineous Exudate Color: red, brown t Structures Foul Odor After Cleansing: No Slough/Fibrino No Wound Bed Granulation Amount: Large (67-100%) Exposed Structure Granulation Quality: Red, Friable Fascia Exposed: No Necrotic Amount: None Present (0%) Fat Layer (Subcutaneous Tissue) Exposed: Yes Tendon Exposed: No Muscle Exposed: No Joint Exposed: No Bone Exposed: No Periwound Skin Texture Texture Color No Abnormalities Noted: No No Abnormalities Noted: No Callus: No Atrophie Blanche: No Crepitus: No Cyanosis: No Excoriation: No Ecchymosis: No Induration: Yes Erythema: No Rash: No Hemosiderin Staining: Yes Scarring: No Mottled: No Pallor: No Moisture Rubor: No No Abnormalities Noted: No ANAYI, BRICCO ([id number]) [id number] Page 9 of 10 Dry / Scaly: No Temperature / Pain Maceration: No Temperature: No Abnormality Tenderness on Palpation: Yes Treatment Notes Wound #1 (Lower Leg) Wound Laterality: Left, Lateral Cleanser Wound Cleanser Discharge Instruction: Cleanse the wound with wound  cleanser prior to applying a clean dressing using gauze sponges, not tissue or cotton balls. Peri-Wound Care Ketoconazole Cream 2% Discharge Instruction: Apply to periwound  in clinic. you use clobetasol at home. Topical Primary Dressing Plain packing strip 1/2 (in) Discharge Instruction: Lightly pack as instructed Compounding topical antibiotics Discharge Instruction: apply antibiotic moisted gauze lightly packed into wound bed. Secondary Dressing ABD Pad, 5x9 Discharge Instruction: Apply over primary dressing as directed. Woven Gauze Sponge, Non-Sterile 4x4 in Discharge Instruction: Apply over primary dressing as directed. Secured With [employer], 4.5x3.1 (in/yd) Discharge Instruction: Secure with Kerlix as directed. 59M Medipore H Soft Cloth Surgical T ape, 4 x 10 (in/yd) Discharge Instruction: Secure with tape as directed. Compression Wrap tubigrip size E Discharge Instruction: apply in the morning and remove at night. Compression Stockings Add-Ons Electronic Signature(s) Signed: 07/05/2022 2:06:49 PM By: Kalman Shan DO Signed: 07/08/2022 12:09:11 PM By: Rhae Hammock RN Entered By: Kalman Shan on 07/05/2022 13:28:30 -------------------------------------------------------------------------------- Vitals Details Patient Name: Date of Service: [name], Christine [name] 07/05/2022 12:30 PM Medical Record Number: [id number] Patient Account Number: [id number] Date of Birth/Sex: Treating RN: 1950-12-24 (71 y.o. [name], [name] Primary Care Cristo Ausburn: [name] Other Clinician: Referring Tevin Shillingford: Treating Moorea Boissonneault/Extender: Judie Grieve in Treatment: 32 Vital Signs Time Taken: 12:39 Temperature (F): 97.8 Height (in): 67 Pulse (bpm): 74 Weight (lbs): 340 Respiratory Rate (breaths/min): 17 Body Mass Index (BMI): 53.2 Blood Pressure (mmHg): 182/93 Reference Range: 80 - 120 mg / dl [name], [name] ([id number])  [id number] Page 10 of 10 Electronic Signature(s) Signed: 07/08/2022 12:09:11 PM By: Rhae Hammock RN Entered By: Rhae Hammock on 07/05/2022 12:39:18

## 2022-07-11 DIAGNOSIS — Z9181 History of falling: Secondary | ICD-10-CM | POA: Diagnosis not present

## 2022-07-11 DIAGNOSIS — Z96653 Presence of artificial knee joint, bilateral: Secondary | ICD-10-CM | POA: Diagnosis not present

## 2022-07-11 DIAGNOSIS — E039 Hypothyroidism, unspecified: Secondary | ICD-10-CM | POA: Diagnosis not present

## 2022-07-11 DIAGNOSIS — Z6841 Body Mass Index (BMI) 40.0 and over, adult: Secondary | ICD-10-CM | POA: Diagnosis not present

## 2022-07-11 DIAGNOSIS — D500 Iron deficiency anemia secondary to blood loss (chronic): Secondary | ICD-10-CM | POA: Diagnosis not present

## 2022-07-11 DIAGNOSIS — T24332D Burn of third degree of left lower leg, subsequent encounter: Secondary | ICD-10-CM | POA: Diagnosis not present

## 2022-07-11 DIAGNOSIS — Z7901 Long term (current) use of anticoagulants: Secondary | ICD-10-CM | POA: Diagnosis not present

## 2022-07-11 DIAGNOSIS — F325 Major depressive disorder, single episode, in full remission: Secondary | ICD-10-CM | POA: Diagnosis not present

## 2022-07-11 DIAGNOSIS — I5032 Chronic diastolic (congestive) heart failure: Secondary | ICD-10-CM | POA: Diagnosis not present

## 2022-07-11 DIAGNOSIS — L03116 Cellulitis of left lower limb: Secondary | ICD-10-CM | POA: Diagnosis not present

## 2022-07-11 DIAGNOSIS — E871 Hypo-osmolality and hyponatremia: Secondary | ICD-10-CM | POA: Diagnosis not present

## 2022-07-11 DIAGNOSIS — I4891 Unspecified atrial fibrillation: Secondary | ICD-10-CM | POA: Diagnosis not present

## 2022-07-11 DIAGNOSIS — I110 Hypertensive heart disease with heart failure: Secondary | ICD-10-CM | POA: Diagnosis not present

## 2022-07-11 DIAGNOSIS — Z9049 Acquired absence of other specified parts of digestive tract: Secondary | ICD-10-CM | POA: Diagnosis not present

## 2022-07-11 DIAGNOSIS — J309 Allergic rhinitis, unspecified: Secondary | ICD-10-CM | POA: Diagnosis not present

## 2022-07-12 ENCOUNTER — Encounter (HOSPITAL_BASED_OUTPATIENT_CLINIC_OR_DEPARTMENT_OTHER): Payer: PPO | Admitting: Internal Medicine

## 2022-07-12 DIAGNOSIS — I890 Lymphedema, not elsewhere classified: Secondary | ICD-10-CM | POA: Diagnosis not present

## 2022-07-12 DIAGNOSIS — I87312 Chronic venous hypertension (idiopathic) with ulcer of left lower extremity: Secondary | ICD-10-CM | POA: Diagnosis not present

## 2022-07-12 DIAGNOSIS — T798XXA Other early complications of trauma, initial encounter: Secondary | ICD-10-CM

## 2022-07-12 DIAGNOSIS — L97822 Non-pressure chronic ulcer of other part of left lower leg with fat layer exposed: Secondary | ICD-10-CM | POA: Diagnosis not present

## 2022-07-12 NOTE — Progress Notes (Signed)
Mariel SleetORRIS, Shabre [name] ([id number]) [id number] Page 1 of 9 Visit Report for 07/12/2022 Chief Complaint Document Details Patient Name: Date of Service: [name], Christine LYN [name]. 07/12/2022 10:45 A M Medical Record Number: [id number] Patient Account Number: [id number] Date of Birth/Sex: Treating RN: 06/20/1951 (71 y.o. F) Primary Care Provider: Merri BrunettePharr, [name] Other Clinician: Referring Provider: Treating Provider/Extender: [name] [name] Weeks in Treatment: 08: 33 Information Obtained from: Patient Chief Complaint 11/19/2021; Left lower extremity wound status post fall Electronic Signature(s) Signed: 07/12/2022 12:46:27 PM By: Christine CorwinHoffman, Christine Eimer [name] Entered By: [name] on 07/12/2022 11:26:33 -------------------------------------------------------------------------------- HPI Details Patient Name: Date of Service: [name], Christine LYN [name]. 07/12/2022 10:45 A M Medical Record Number: [id number] Patient Account Number: [id number] Date of Birth/Sex: Treating RN: 05/26/1951 (71 y.o. F) Primary Care Provider: Merri BrunettePharr, [name] Other Clinician: Referring Provider: Treating Provider/Extender: [name] [name] Weeks in Treatment: 2333 History of Present Illness HPI Description: Admission 11/19/2021 [name] is a 71 year old female with a past medical history of paroxysmal A-fib on Eliquis, hypothyroidism, major depressive disorder, venous insufficiency and chronic diastolic heart failure that presents to the clinic for a 1 month history of nonhealing wound to the left lower extremity. She visited the ED on 10/18/2021 after a mechanical fall. She developed a hematoma that subsequently opened. She was hospitalized for 7 days and discharged on 10/25/2021. She has been on several different antibiotics For the past month. She states that most recently she was on Levaquin and linezolid for the past week. She states she completes her antibiotic course  tomorrow. She has been using Dakin's wet-to-dry dressings to the wound bed. She denies signs of infection. 4/7; patient presents for follow-up. She has been using Dakin's wet-to-dry dressings. She did end up going to the ED on 4/1 because she had excess bleeding with dressing change that she could not stop. She is on Eliquis for A-fib. In the ED they tied off a small artery. She has had no issues since discharge. She denies signs of infection. 4/14; this is a very difficult clinical situation. A patient with underlying chronic venous insufficiency and lymphedema very significant lower extremity edema had a hematoma after a fall on her left upper lateral lower leg. She is on Eliquis for atrial fibrillation apparently with a history of a splenic infarct following with Dr. Berton MountSteve [name] of cardiology. She has exhibited significant bleeding from the wound surface including ao Venous bleeder that required suturing short while ago. She has been using Dakin's wet-to-dry packing and over the surface of the wound. She saw [name] yesterday he is reluctant to consider stopping the Eliquis because of the prior history of presumed cardioembolism. Wants to communicate with [name] when she returns. In a perfect world where she was not on Eliquis she requires a wound VAC with additional compression wraps but I understand the reluctance to [name] this because of the concerns of bleeding 4/28; the patient's wound actually looks better today using Dakin's wet-to-dry that she is changing twice a day she is wrapping this with Kerlix and Ace wrapping. She tells me she had 2 small bleeding areas which were part of the superficial wound that stopped this week with direct pressure. Other than that no major issues. 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  The bigger question from my point of view is the wound VAC issue. As far as she knows and her daughter-in-law verifies that she has not had any bleeding from the deeper parts of the wound although the bleeding has been superficial including the one that sent her to the ER for stitches. She is concerned that a wound VAC would cause further bleeding and I cannot completely allay those concerns. [name], [name] ([id number]) [id number] Page 2 of 9 5/8; patient presents for follow-up. She has no issues or complaints today. She has been using Dakin's wet-to-dry dressings without issues. She denies signs of infection. 5/12; patient presents for follow-up. She continues to use Dakin's wet-to-dry dressings without issues. She denies signs of infection. She reports some issues with bleeding at times but this has improved. She denies signs of infection. 6/1; patient presents for follow-up. She was recently hospitalized for upper left leg thigh cellulitis. She was given IV cefepime and vancomycin and discharged on oral antibiotics. She has been using Dakin's wet-to-dry dressings to the left lower leg wound. She reports improvement in healing. She currently denies systemic signs of infection. 6/7; patient is using Dakin's wet-to-dry twice daily. In general this looks better than when I saw this a month or so ago however still considerable depth to the tunnel in her left leg. She is going for iron infusions ordered by her primary care doctor I believe 6/15; patient presents for follow-up. She has been using Dakin's wet-to-dry dressings. She has noted some scattered small areas that blister up and heal on her left lower leg. She has been using mupirocin ointment on them. Nothing open today. 6/23; patient's been using Dakin's wet-to-dry dressings to the tunneled wound and Hydrofera Blue to the opening. She has no issues or complaints  today. 6/29; patient presents for follow-up. She has been using Dakin's wet-to-dry dressings to the tunneled wound and Hydrofera Blue to the opening. She states that [facility] is sticking to the wound bed. She denies signs of infection. 7/7; patient presents for follow-up. She has been using Dakin's wet-to-dry dressings to the tunneled wound and PolyMem silver to the opening without issues. 7/13; patient presents for follow-up. She has been using PolyMem silver to the opening and the rope to the tunnel. She has no issues or complaints today. She denies signs of infection. 7/20; patient presents for follow-up. We have been using PolyMem silver to the wound bed. She has no issues or complaints today. She is receiving her custom compression garments tomorrow. 7/27; patient presents for follow-up. She has been using PolyMem silver to the wound bed. She is having her custom compression garments adjusted as these are not staying on very easily. 8/30; patient presents for follow-up. We have been using PolyMem silver to the wound bed. She is still waiting on her custom compression garments to be ordered. She denies signs of infection. 8/11; patient presents for follow-up. The wound VAC has been started and patient has been using this for the past week. Patient has home health who changes the wound VAC. She developed irritation to the periwound. DuoDERM was not being used to the periwound despite being ordered. 8/15; patient presents for follow-up. She restarted the wound VAC and has had improvement to the periwound with the use of DuoDERM. 8/24; patient presents for follow-up. She has been using the wound VAC. She has developed a wound just superior to the original wound. Likely as a result from the wound VAC. She denies signs of infection. 8/29;  patient presents for follow-up. We have been using collagen to the wound bed. She has held off on the wound VAC for the past week. 9/7; patient presents for  follow-up. We have been using iodoform packing to the wound bed. She has mild tenderness to the periwound. She denies systemic signs of infection. 9/12; patient presents for follow-up. Patient has been using Dakin's wet-to-dry dressings. She has no issues or complaints today. She denies signs of infection. 9/21; patient presents for follow-up. PuraPly #1 was placed in standard fashion at last clinic visit. She has no issues or complaints today. She denies signs of infection. 10/3; patient presents for follow-up. PuraPly #2 was placed in standard fashion at last clinic visit. She has no issues or complaints today. She denies signs of infection. 10/10; patient presents for follow-up. PuraPly #3 was placed in standard fashion at last clinic visit. She has no issues or complaints today. She denies signs of infection. 10/17; patient presents for follow-up. She has been using silver alginate to the tunneled wound. She has no issues or complaints today. 10/31; patient presents for follow-up. We have been doing Dakin's wet-to-dry dressings to the tunneled wound. The wound VAC has been ordered however patient has not received this. She currently denies signs of infection. She reports a lot of serosanguineous drainage. 11/14; patient presents for follow-up. She had a PCR culture done at last clinic visit that grew Pseudomonas aeruginosa. She obtained Keystone antibiotic spray yesterday. She also obtained her ultrasound of the left lower extremity to assess for abscess however the vascular lab ordered On erroneous test. This has been rectified. Patient was not charged for the test. DVT study showed no DVT T also reported to our nurse that they did not notice any abscess s. [name] while doing the DVT rule out. 11/21; patient presents for follow-up. She has been using Keystone antibiotic with gauze packing. She has no issues or complaints today. Electronic Signature(s) Signed: 07/12/2022 12:46:27 PM By: Christine Corwin [name] Entered By: Christine Corwin on 07/12/2022 11:27:01 -------------------------------------------------------------------------------- Physical Exam Details Patient Name: Date of Service: Christine Rolling LYN [name]. 07/12/2022 10:45 A M Medical Record Number: [id number] Patient Account Number: [id number] Date of Birth/Sex: Treating RN: 04/12/1951 (71 y.o. Mckinlee Dunk, Ocie Doyne ([id number]) [id number] Page 3 of 9 Primary Care Provider: [name] Other Clinician: Referring Provider: Treating Provider/Extender: Christine Rummage in Treatment: 33 Constitutional respirations regular, non-labored and within target range for patient.. Cardiovascular 2+ dorsalis pedis/posterior tibialis pulses. Psychiatric pleasant and cooperative. Notes Left lower extremity: Open wound with granulation tissue at the opening with a depth of 2.5 cm. No increased warmth, erythema or purulent drainage. No tenderness on palpation. Electronic Signature(s) Signed: 07/12/2022 12:46:27 PM By: Christine Corwin [name] Entered By: Christine Corwin on 07/12/2022 11:27:26 -------------------------------------------------------------------------------- Physician Orders Details Patient Name: Date of Service: Christine Rolling LYN [name]. 07/12/2022 10:45 A M Medical Record Number: [id number] Patient Account Number: [id number] Date of Birth/Sex: Treating RN: April 19, 1951 (71 y.o. [name] Primary Care Provider: [name] Other Clinician: Referring Provider: Treating Provider/Extender: Christine Rummage in Treatment: 30 Verbal / Phone Orders: No Diagnosis Coding ICD-10 Coding Code Description [phone] Non-pressure chronic ulcer of other part of left lower leg with fat layer exposed T79.8XXA Other early complications of trauma, initial encounter I87.312 Chronic venous hypertension (idiopathic) with ulcer of left lower extremity I89.0 Lymphedema,  not elsewhere classified I48.0 Paroxysmal atrial fibrillation Z79.01 Long term (current) use of anticoagulants Follow-up Appointments ppointment in 1  week. - Dr. Mikey Bussing Return A ppointment in 2 weeks. - Dr. Mikey Bussing Return A Other: - Continue Topical antibiotic ointment. use clobetasol antifungal at home around the wound. Anesthetic (In clinic) Topical Lidocaine 5% applied to wound bed Cellular or Tissue Based Products Wound #1 Left,Lateral Lower Leg Cellular or Tissue Based Product Type: - 05/12/2022 Puraply AM #1 applied. 05/17/22 PURAPLY AM # 2 05/24/2022 Puraply AM #3 05/31/2022 HOLD Bathing/ Shower/ Hygiene May shower with protection but [name] not get wound dressing(s) wet. Edema Control - Lymphedema / SCD / Other Elevate legs to the level of the heart or above for 30 minutes daily and/or when sitting, a frequency of: - 3-4 times a day throughout the day. Avoid standing for long periods of time. Moisturize legs daily. - both legs every night before bed. IYLAH, DWORKIN ([id number]) [id number] Page 4 of 9 Home Health New wound care orders this week; continue Home Health for wound care. May utilize formulary equivalent dressing for wound treatment orders unless otherwise specified. - compounding topical antibiotic, pack wound, cover with ABD, secure with kerlix and Ace wrap. home health weekly. Other Home Health Orders/Instructions: - [facility] Wound Treatment Wound #1 - Lower Leg Wound Laterality: Left, Lateral Cleanser: Wound Cleanser (Home Health) 1 x Per Day/30 Days Discharge Instructions: Cleanse the wound with wound cleanser prior to applying a clean dressing using gauze sponges, not tissue or cotton balls. Peri-Wound Care: Ketoconazole Cream 2% 1 x Per Day/30 Days Discharge Instructions: Apply to periwound in clinic. you use clobetasol at home. Prim Dressing: Plain packing strip 1/2 (in) 1 x Per Day/30 Days ary Discharge Instructions:  Lightly pack as instructed Prim Dressing: Compounding topical antibiotics 1 x Per Day/30 Days ary Discharge Instructions: apply antibiotic moisted gauze lightly packed into wound bed. Secondary Dressing: ABD Pad, 5x9 1 x Per Day/30 Days Discharge Instructions: Apply over primary dressing as directed. Secondary Dressing: Woven Gauze Sponge, Non-Sterile 4x4 in (Home Health) 1 x Per Day/30 Days Discharge Instructions: Apply over primary dressing as directed. Secured With: [employer], 4.5x3.1 (in/yd) (Home Health) 1 x Per Day/30 Days Discharge Instructions: Secure with Kerlix as directed. Secured With: 39M Medipore H Soft Cloth Surgical T ape, 4 x 10 (in/yd) (Home Health) 1 x Per Day/30 Days Discharge Instructions: Secure with tape as directed. Compression Wrap: ace wrap 1 x Per Day/30 Days Electronic Signature(s) Signed: 07/12/2022 12:46:27 PM By: Christine Corwin [name] Entered By: Christine Corwin on 07/12/2022 11:27:34 -------------------------------------------------------------------------------- Problem List Details Patient Name: Date of Service: Christine Rolling LYN [name]. 07/12/2022 10:45 A M Medical Record Number: [id number] Patient Account Number: [id number] Date of Birth/Sex: Treating RN: 1951-02-10 (71 y.o. [name] Primary Care Provider: [name] Other Clinician: Referring Provider: Treating Provider/Extender: Christine Rummage in Treatment: 78 Active Problems ICD-10 Encounter Code Description Active Date MDM Diagnosis [id number] Non-pressure chronic ulcer of other part of left lower leg with fat layer exposed3/31/2023 No Yes T79.8XXA Other early complications of trauma, initial encounter 11/19/2021 No Yes I87.312 Chronic venous hypertension (idiopathic) with ulcer of left lower extremity 04/28/2022 No Yes I89.0 Lymphedema, not elsewhere classified 04/28/2022 No Yes [name], [name] ([id number]) [phone].pdf Page 5 of  9 I48.0 Paroxysmal atrial fibrillation 11/19/2021 No Yes Z79.01 Long term (current) use of anticoagulants 11/19/2021 No Yes Inactive Problems Resolved Problems Electronic Signature(s) Signed: 07/12/2022 12:46:27 PM By: Christine Corwin [name] Entered By: Christine Corwin on 07/12/2022 11:26:18 -------------------------------------------------------------------------------- Progress Note Details Patient Name: Date of Service: NO [name], Christine LYN [name].  07/12/2022 10:45 A M Medical Record Number: [id number] Patient Account Number: [id number] Date of Birth/Sex: Treating RN: 01/02/1951 (71 y.o. F) Primary Care Provider: [name] Other Clinician: Referring Provider: Treating Provider/Extender: Christine Rummage in Treatment: 38 Subjective Chief Complaint Information obtained from Patient 11/19/2021; Left lower extremity wound status post fall History of Present Illness (HPI) Admission 11/19/2021 Ms. Estefana Taylor is a 71 year old female with a past medical history of paroxysmal A-fib on Eliquis, hypothyroidism, major depressive disorder, venous insufficiency and chronic diastolic heart failure that presents to the clinic for a 1 month history of nonhealing wound to the left lower extremity. She visited the ED on 10/18/2021 after a mechanical fall. She developed a hematoma that subsequently opened. She was hospitalized for 7 days and discharged on 10/25/2021. She has been on several different antibiotics For the past month. She states that most recently she was on Levaquin and linezolid for the past week. She states she completes her antibiotic course tomorrow. She has been using Dakin's wet-to-dry dressings to the wound bed. She denies signs of infection. 4/7; patient presents for follow-up. She has been using Dakin's wet-to-dry dressings. She did end up going to the ED on 4/1 because she had excess bleeding with dressing change that she could not stop. She is on Eliquis for  A-fib. In the ED they tied off a small artery. She has had no issues since discharge. She denies signs of infection. 4/14; this is a very difficult clinical situation. A patient with underlying chronic venous insufficiency and lymphedema very significant lower extremity edema had a hematoma after a fall on her left upper lateral lower leg. She is on Eliquis for atrial fibrillation apparently with a history of a splenic infarct following with Dr. Berton Mount of cardiology. She has exhibited significant bleeding from the wound surface including ao Venous bleeder that required suturing short while ago. She has been using Dakin's wet-to-dry packing and over the surface of the wound. She saw Dr. Graciela Husbands yesterday he is reluctant to consider stopping the Eliquis because of the prior history of presumed cardioembolism. Wants to communicate with Dr. Mikey Bussing when she returns. In a perfect world where she was not on Eliquis she requires a wound VAC with additional compression wraps but I understand the reluctance to [name] this because of the concerns of bleeding 4/28; the patient's wound actually looks better today using Dakin's wet-to-dry that she is changing twice a day she is wrapping this with Kerlix and Ace wrapping. She tells me she had 2 small bleeding areas which were part of the superficial wound that stopped this week with direct pressure. Other than that no major issues. In follow-up from discussion of last week Dr. 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 The bigger question from my point of view is the wound VAC issue. As far as she knows and her daughter-in-law verifies that she has not had any bleeding from the deeper parts of the wound although the bleeding has been superficial including the one that sent her to the ER for stitches.  She is concerned that a wound VAC would cause further bleeding and I cannot completely allay those concerns. 5/8; patient presents for follow-up. She has no issues or complaints today. She has been using Dakin's wet-to-dry dressings without issues. She denies signs of infection.  5/12; patient presents for follow-up. She continues to use Dakin's wet-to-dry dressings without issues. She denies signs of infection. She reports some issues with bleeding at times but this has improved. She denies signs of infection. 6/1; patient presents for follow-up. She was recently hospitalized for upper left leg thigh cellulitis. She was given IV cefepime and vancomycin and discharged [name], [name] ([id number]) [id number] Page 6 of 9 on oral antibiotics. She has been using Dakin's wet-to-dry dressings to the left lower leg wound. She reports improvement in healing. She currently denies systemic signs of infection. 6/7; patient is using Dakin's wet-to-dry twice daily. In general this looks better than when I saw this a month or so ago however still considerable depth to the tunnel in her left leg. She is going for iron infusions ordered by her primary care doctor I believe 6/15; patient presents for follow-up. She has been using Dakin's wet-to-dry dressings. She has noted some scattered small areas that blister up and heal on her left lower leg. She has been using mupirocin ointment on them. Nothing open today. 6/23; patient's been using Dakin's wet-to-dry dressings to the tunneled wound and Hydrofera Blue to the opening. She has no issues or complaints today. 6/29; patient presents for follow-up. She has been using Dakin's wet-to-dry dressings to the tunneled wound and Hydrofera Blue to the opening. She states that [facility] is sticking to the wound bed. She denies signs of infection. 7/7; patient presents for follow-up. She has been using Dakin's wet-to-dry dressings to the  tunneled wound and PolyMem silver to the opening without issues. 7/13; patient presents for follow-up. She has been using PolyMem silver to the opening and the rope to the tunnel. She has no issues or complaints today. She denies signs of infection. 7/20; patient presents for follow-up. We have been using PolyMem silver to the wound bed. She has no issues or complaints today. She is receiving her custom compression garments tomorrow. 7/27; patient presents for follow-up. She has been using PolyMem silver to the wound bed. She is having her custom compression garments adjusted as these are not staying on very easily. 8/30; patient presents for follow-up. We have been using PolyMem silver to the wound bed. She is still waiting on her custom compression garments to be ordered. She denies signs of infection. 8/11; patient presents for follow-up. The wound VAC has been started and patient has been using this for the past week. Patient has home health who changes the wound VAC. She developed irritation to the periwound. DuoDERM was not being used to the periwound despite being ordered. 8/15; patient presents for follow-up. She restarted the wound VAC and has had improvement to the periwound with the use of DuoDERM. 8/24; patient presents for follow-up. She has been using the wound VAC. She has developed a wound just superior to the original wound. Likely as a result from the wound VAC. She denies signs of infection. 8/29; patient presents for follow-up. We have been using collagen to the wound bed. She has held off on the wound VAC for the past week. 9/7; patient presents for follow-up. We have been using iodoform packing to the wound bed. She has mild tenderness to the periwound. She denies systemic signs of infection. 9/12; patient presents for follow-up. Patient has been using Dakin's wet-to-dry dressings. She has no issues or complaints today. She denies signs of infection. 9/21; patient presents for  follow-up. PuraPly #1 was placed in standard fashion at last clinic visit. She has no  issues or complaints today. She denies signs of infection. 10/3; patient presents for follow-up. PuraPly #2 was placed in standard fashion at last clinic visit. She has no issues or complaints today. She denies signs of infection. 10/10; patient presents for follow-up. PuraPly #3 was placed in standard fashion at last clinic visit. She has no issues or complaints today. She denies signs of infection. 10/17; patient presents for follow-up. She has been using silver alginate to the tunneled wound. She has no issues or complaints today. 10/31; patient presents for follow-up. We have been doing Dakin's wet-to-dry dressings to the tunneled wound. The wound VAC has been ordered however patient has not received this. She currently denies signs of infection. She reports a lot of serosanguineous drainage. 11/14; patient presents for follow-up. She had a PCR culture done at last clinic visit that grew Pseudomonas aeruginosa. She obtained Keystone antibiotic spray yesterday. She also obtained her ultrasound of the left lower extremity to assess for abscess however the vascular lab ordered On erroneous test. This has been rectified. Patient was not charged for the test. DVT study showed no DVT T also reported to our nurse that they did not notice any abscess s. [name] while doing the DVT rule out. 11/21; patient presents for follow-up. She has been using Keystone antibiotic with gauze packing. She has no issues or complaints today. Patient History Medical History Cardiovascular Patient has history of Congestive Heart Failure, Hypertension Hospitalization/Surgery History - Cellulitis left leg- 01/03/2022-01/07/2022. Medical A Surgical History Notes nd Constitutional Symptoms (General Health) Infarction of spleen Hematologic/Lymphatic Hypothyroidism Cardiovascular A-Fib Gastrointestinal Gastroesophageal  reflux Genitourinary Chronic kidney disease Musculoskeletal Osteoarthritis of knee Psychiatric Anxiety [name], [name] ([id number]) [id number] Page 7 of 9 Objective Constitutional respirations regular, non-labored and within target range for patient.. Vitals Time Taken: 10:40 AM, Height: 67 in, Weight: 340 lbs, BMI: 53.2, Temperature: 98.1 F, Pulse: 46 bpm, Respiratory Rate: 18 breaths/min, Blood Pressure: 157/82 mmHg. Cardiovascular 2+ dorsalis pedis/posterior tibialis pulses. Psychiatric pleasant and cooperative. General Notes: Left lower extremity: Open wound with granulation tissue at the opening with a depth of 2.5 cm. No increased warmth, erythema or purulent drainage. No tenderness on palpation. Integumentary (Hair, Skin) Wound #1 status is Open. Original cause of wound was Trauma. The date acquired was: 10/18/2021. The wound has been in treatment 33 weeks. The wound is located on the Left,Lateral Lower Leg. The wound measures 0.3cm length x 0.4cm width x 0.5cm depth; 0.094cm^2 area and 0.047cm^3 volume. There is Fat Layer (Subcutaneous Tissue) exposed. There is no undermining noted, however, there is tunneling at 11:00 with a maximum distance of 2.5cm. There is a medium amount of serosanguineous drainage noted. The wound margin is distinct with the outline attached to the wound base. There is large (67-100%) red granulation within the wound bed. There is no necrotic tissue within the wound bed. The periwound skin appearance exhibited: Induration, Hemosiderin Staining. The periwound skin appearance did not exhibit: Callus, Crepitus, Excoriation, Rash, Scarring, Dry/Scaly, Maceration, Atrophie Blanche, Cyanosis, Ecchymosis, Mottled, Pallor, Rubor, Erythema. Periwound temperature was noted as No Abnormality. The periwound has tenderness on palpation. Assessment Active Problems ICD-10 Non-pressure chronic ulcer of other part of left lower leg with  fat layer exposed Other early complications of trauma, initial encounter Chronic venous hypertension (idiopathic) with ulcer of left lower extremity Lymphedema, not elsewhere classified Paroxysmal atrial fibrillation Long term (current) use of anticoagulants Patient's wound has shown improvement in size and appearance since last clinic visit. I recommended continuing Keystone antibiotic spray with  plain packing strips T the tunneled wound. Follow-up in 1 week. o Plan Follow-up Appointments: Return Appointment in 1 week. - Dr. Mikey Bussing Return Appointment in 2 weeks. - Dr. Mikey Bussing Other: - Continue Topical antibiotic ointment. use clobetasol antifungal at home around the wound. Anesthetic: (In clinic) Topical Lidocaine 5% applied to wound bed Cellular or Tissue Based Products: Wound #1 Left,Lateral Lower Leg: Cellular or Tissue Based Product Type: - 05/12/2022 Puraply AM #1 applied. 05/17/22 PURAPLY AM # 2 05/24/2022 Puraply AM #3 05/31/2022 HOLD Bathing/ Shower/ Hygiene: May shower with protection but [name] not get wound dressing(s) wet. Edema Control - Lymphedema / SCD / Other: Elevate legs to the level of the heart or above for 30 minutes daily and/or when sitting, a frequency of: - 3-4 times a day throughout the day. Avoid standing for long periods of time. Moisturize legs daily. - both legs every night before bed. Home Health: New wound care orders this week; continue Home Health for wound care. May utilize formulary equivalent dressing for wound treatment orders unless otherwise specified. - compounding topical antibiotic, pack wound, cover with ABD, secure with kerlix and Ace wrap. home health weekly. Other Home Health Orders/Instructions: - [facility] WOUND #1: - Lower Leg Wound Laterality: Left, Lateral Cleanser: Wound Cleanser (Home Health) 1 x Per Day/30 Days Discharge Instructions: Cleanse the wound with wound cleanser prior to applying a clean dressing using gauze sponges, not  tissue or cotton balls. Peri-Wound Care: Ketoconazole Cream 2% 1 x Per Day/30 Days Discharge Instructions: Apply to periwound in clinic. you use clobetasol at home. Prim Dressing: Plain packing strip 1/2 (in) 1 x Per Day/30 Days ary Discharge Instructions: Lightly pack as instructed Prim Dressing: Compounding topical antibiotics 1 x Per Day/30 Days ary Discharge Instructions: apply antibiotic moisted gauze lightly packed into wound bed. [name], [name] ([id number]) [id number] Page 8 of 9 Secondary Dressing: ABD Pad, 5x9 1 x Per Day/30 Days Discharge Instructions: Apply over primary dressing as directed. Secondary Dressing: Woven Gauze Sponge, Non-Sterile 4x4 in (Home Health) 1 x Per Day/30 Days Discharge Instructions: Apply over primary dressing as directed. Secured With: [employer], 4.5x3.1 (in/yd) (Home Health) 1 x Per Day/30 Days Discharge Instructions: Secure with Kerlix as directed. Secured With: 3M Medipore H Soft Cloth Surgical T ape, 4 x 10 (in/yd) (Home Health) 1 x Per Day/30 Days Discharge Instructions: Secure with tape as directed. Compression Wrap: ace wrap 1 x Per Day/30 Days 1. Keystone antibiotic spray with packing strip daily to the wound bed 2. Follow-up in 1 week Electronic Signature(s) Signed: 07/12/2022 12:46:27 PM By: Christine Corwin [name] Entered By: Christine Corwin on 07/12/2022 11:28:59 -------------------------------------------------------------------------------- HxROS Details Patient Name: Date of Service: [name], Christine LYN [name]. 07/12/2022 10:45 A M Medical Record Number: [id number] Patient Account Number: [id number] Date of Birth/Sex: Treating RN: [birth year] (71 y.o. F) Primary Care Provider: [name] Other Clinician: Referring Provider: Treating Provider/Extender: Christine Rummage in Treatment: 72 Constitutional Symptoms (General Health) Medical History: Past Medical History  Notes: Infarction of spleen Hematologic/Lymphatic Medical History: Past Medical History Notes: Hypothyroidism Cardiovascular Medical History: Positive for: Congestive Heart Failure; Hypertension Past Medical History Notes: A-Fib Gastrointestinal Medical History: Past Medical History Notes: Gastroesophageal reflux Genitourinary Medical History: Past Medical History Notes: Chronic kidney disease Musculoskeletal Medical History: Past Medical History Notes: Osteoarthritis of knee Psychiatric Medical History: Past Medical History Notes: Anxiety [name], [name] ([id number]) [id number] Page 9 of 9 Immunizations Pneumococcal Vaccine: Received Pneumococcal Vaccination: No Implantable Devices No devices added  Hospitalization / Surgery History Type of Hospitalization/Surgery Cellulitis left leg- 01/03/2022-01/07/2022 Electronic Signature(s) Signed: 07/12/2022 12:46:27 PM By: Christine Corwin [name] Entered By: Christine Corwin on 07/12/2022 11:27:06 -------------------------------------------------------------------------------- SuperBill Details Patient Name: Date of Service: Christine Rolling LYN [name]. 07/12/2022 Medical Record Number: [id number] Patient Account Number: [id number] Date of Birth/Sex: Treating RN: 1951-01-14 (71 y.o. [name] Primary Care Provider: [name] Other Clinician: Referring Provider: Treating Provider/Extender: Christine Rummage in Treatment: 33 Diagnosis Coding ICD-10 Codes Code Description [phone] Non-pressure chronic ulcer of other part of left lower leg with fat layer exposed T79.8XXA Other early complications of trauma, initial encounter I87.312 Chronic venous hypertension (idiopathic) with ulcer of left lower extremity I89.0 Lymphedema, not elsewhere classified I48.0 Paroxysmal atrial fibrillation Z79.01 Long term (current) use of anticoagulants Facility Procedures : CPT4 Code:  [id number] Description: 99213 - WOUND CARE VISIT-LEV 3 EST PT Modifier: Quantity: 1 Physician Procedures : CPT4 Code Description Modifier [id number] 99213 - WC PHYS LEVEL 3 - EST PT ICD-10 Diagnosis Description L97.822 Non-pressure chronic ulcer of other part of left lower leg with fat layer exposed T79.8XXA Other early complications of trauma, initial  encounter I87.312 Chronic venous hypertension (idiopathic) with ulcer of left lower extremity I89.0 Lymphedema, not elsewhere classified Quantity: 1 Electronic Signature(s) Signed: 07/12/2022 12:46:27 PM By: Christine Corwin [name] Entered By: Christine Corwin on 07/12/2022 11:29:14

## 2022-07-13 NOTE — Progress Notes (Signed)
MARIANGEL, RINGLEY ([id number]) [id number] Page 1 of 9 Visit Report for 07/12/2022 Arrival Information Details Patient Name: Date of Service: [name] 07/12/2022 10:45 A M Medical Record Number: [id number] Patient Account Number: [id number] Date of Birth/Sex: Treating RN: 1951/03/20 (71 y.o. F) Primary Care Trystin Terhune: Deland Pretty Other Clinician: Referring Hermine Feria: Treating Aarin Sparkman/Extender: Judie Grieve in Treatment: 33 Visit Information History Since Last Visit Added or deleted any medications: No Patient Arrived: Walker Any new allergies or adverse reactions: No Arrival Time: 10:40 Had a fall or experienced change in No Accompanied By: son activities of daily living that may affect Transfer Assistance: None risk of falls: Patient Identification Verified: Yes Signs or symptoms of abuse/neglect since last visito No Secondary Verification Process Completed: Yes Hospitalized since last visit: No Patient Requires Transmission-Based Precautions: No Implantable device outside of the clinic excluding No Patient Has Alerts: Yes cellular tissue based products placed in the center Patient Alerts: Patient on Blood Thinner since last visit: Has Dressing in Place as Prescribed: Yes Pain Present Now: No Electronic Signature(s) Signed: 07/12/2022 4:33:14 PM By: Erenest Blank Entered By: Erenest Blank on 07/12/2022 10:40:41 -------------------------------------------------------------------------------- Clinic Level of Care Assessment Details Patient Name: Date of Service: [name] 07/12/2022 10:45 A M Medical Record Number: [id number] Patient Account Number: [id number] Date of Birth/Sex: Treating RN: [birth year] (71 y.o. [name] Primary Care Tavish Gettis: Deland Pretty Other Clinician: Referring Luiza Carranco: Treating Leo Fray/Extender: Judie Grieve in Treatment: 33 Clinic Level of  Care Assessment Items TOOL 4 Quantity Score X- 1 0 Use when only an EandM is performed on FOLLOW-UP visit ASSESSMENTS - Nursing Assessment / Reassessment X- 1 10 Reassessment of Co-morbidities (includes updates in patient status) X- 1 5 Reassessment of Adherence to Treatment Plan ASSESSMENTS - Wound and Skin A ssessment / Reassessment X - Simple Wound Assessment / Reassessment - one wound 1 5 _0  - 0 Complex Wound Assessment / Reassessment - multiple wounds _1  - 0 Dermatologic / Skin Assessment (not related to wound area) ASSESSMENTS - Focused Assessment _2  - 0 Circumferential Edema Measurements - multi extremities _3  - 0 Nutritional Assessment / Counseling / Intervention [name], [name] ([id number]) [id number] Page 2 of 9 _4  - 0 Lower Extremity Assessment (monofilament, tuning fork, pulses) _5  - 0 Peripheral Arterial Disease Assessment (using hand held doppler) ASSESSMENTS - Ostomy and/or Continence Assessment and Care _6  - 0 Incontinence Assessment and Management _7  - 0 Ostomy Care Assessment and Management (repouching, etc.) PROCESS - Coordination of Care X - Simple Patient / Family Education for ongoing care 1 15 _8  - 0 Complex (extensive) Patient / Family Education for ongoing care X- 1 10 Staff obtains Programmer, systems, Records, T Results / Process Orders est _9  - 0 Staff telephones HHA, Nursing Homes / Clarify orders / etc _10  - 0 Routine Transfer to another Facility (non-emergent condition) _11  - 0 Routine Hospital Admission (non-emergent condition) _12  - 0 New Admissions / Biomedical engineer / Ordering NPWT Apligraf, etc. , _13  - 0 Emergency Hospital Admission (emergent condition) X- 1 10 Simple Discharge Coordination _14  - 0 Complex (extensive) Discharge Coordination PROCESS - Special Needs _15  - 0 Pediatric / Minor Patient Management _16  - 0 Isolation Patient Management _17  - 0 Hearing / Language / Visual special needs _18  -  0 Assessment of Community assistance (transportation, D/C planning, etc.) _19  - 0 Additional assistance / Altered mentation _20  - 0 Support Surface(s) Assessment (bed, cushion, seat, etc.) INTERVENTIONS - Wound Cleansing /  Measurement X - Simple Wound Cleansing - one wound 1 5 _0  - 0 Complex Wound Cleansing - multiple wounds X- 1 5 Wound Imaging (photographs - any number of wounds) _1  - 0 Wound Tracing (instead of photographs) X- 1 5 Simple Wound Measurement - one wound _2  - 0 Complex Wound Measurement - multiple wounds INTERVENTIONS - Wound Dressings X - Small Wound Dressing one or multiple wounds 1 10 _3  - 0 Medium Wound Dressing one or multiple wounds _4  - 0 Large Wound Dressing one or multiple wounds <RXVQMGQQPYPPJKDT>_2<\/IZTIWPYKDXIPJASN>_0  - 0 Application of Medications - topical <NLZJQBHALPFXTKWI>_0<\/XBDZHGDJMEQASTMH>_9  - 0 Application of Medications - injection INTERVENTIONS - Miscellaneous _7  - 0 External ear exam _8  - 0 Specimen Collection (cultures, biopsies, blood, body fluids, etc.) _9  - 0 Specimen(s) / Culture(s) sent or taken to Lab for analysis _10  - 0 Patient Transfer (multiple staff / Civil Service fast streamer / Similar devices) _11  - 0 Simple Staple / Suture removal (25 or less) _12  - 0 Complex Staple / Suture removal (26 or more) _13  - 0 Hypo / Hyperglycemic Management (close monitor of Blood Glucose) [name], [name] ([id number]) [id number] Page 3 of 9 _14  - 0 Ankle / Brachial Index (ABI) - do not check if billed separately X- 1 5 Vital Signs Has the patient been seen at the hospital within the last three years: Yes Total Score: 85 Level Of Care: New/Established - Level 3 Electronic Signature(s) Signed: 07/13/2022 10:43:44 AM By: Deon Pilling RN, BSN Entered By: Deon Pilling on 07/12/2022 11:08:45 -------------------------------------------------------------------------------- Encounter Discharge Information Details Patient Name: Date of Service: Christine Debar [name] 07/12/2022 10:45 A M Medical Record  Number: [id number] Patient Account Number: [id number] Date of Birth/Sex: Treating RN: [birth year] (71 y.o. [name] Primary Care Jakolby Sedivy: Deland Pretty Other Clinician: Referring Pio Eatherly: Treating Ayra Hodgdon/Extender: Judie Grieve in Treatment: 23 Encounter Discharge Information Items Discharge Condition: Stable Ambulatory Status: Walker Discharge Destination: Home Transportation: Private Auto Accompanied By: self Schedule Follow-up Appointment: Yes Clinical Summary of Care: Electronic Signature(s) Signed: 07/13/2022 10:43:44 AM By: Deon Pilling RN, BSN Entered By: Deon Pilling on 07/12/2022 11:09:48 -------------------------------------------------------------------------------- Lower Extremity Assessment Details Patient Name: Date of Service: Christine Debar [name] 07/12/2022 10:45 A M Medical Record Number: [id number] Patient Account Number: [id number] Date of Birth/Sex: Treating RN: 12/26/1950 (71 y.o. F) Primary Care Fujie Dickison: Deland Pretty Other Clinician: Referring Tamasha Laplante: Treating Jessilynn Taft/Extender: Judie Grieve in Treatment: 33 Edema Assessment Assessed: [Left: No] [Right: No] Edema: [Left: Ye] [Right: s] Calf Left: Right: Point of Measurement: 31 cm From Medial Instep 49.4 cm Ankle Left: Right: Point of Measurement: 9 cm From Medial Instep 26 cm Electronic Signature(s) Signed: 07/12/2022 4:33:14 PM By: Leonette Most ([id number]) By: Erenest Blank [phone].pdf Page 4 of 9 Signed: 07/12/2022 4:33:14 PM Entered By: Erenest Blank on 07/12/2022 10:50:16 -------------------------------------------------------------------------------- Multi Wound Chart Details Patient Name: Date of Service: [name] 07/12/2022 10:45 A M Medical Record Number: [id number] Patient Account Number: [id number] Date of Birth/Sex: Treating RN: August 01, 1951 (71 y.o. F) Primary  Care Iya Hamed: Deland Pretty Other Clinician: Referring Oron Westrup: Treating Jaton Eilers/Extender: Judie Grieve in Treatment: 33 Vital Signs Height(in): 67 Pulse(bpm): 24 Weight(lbs): 340 Blood Pressure(mmHg): 157/82 Body Mass Index(BMI): 53.2 Temperature(F): 98.1 Respiratory Rate(breaths/min): 18 [1:Photos:] [N/A:N/A] Left, Lateral Lower Leg N/A N/A Wound Location: Trauma N/A N/A Wounding Event: Trauma, Other N/A N/A Primary Etiology: Congestive Heart Failure, N/A N/A Comorbid History: Hypertension 10/18/2021 N/A N/A Date Acquired: 35 N/A N/A  Weeks of Treatment: Open N/A N/A Wound Status: No N/A N/A Wound Recurrence: Yes N/A N/A Clustered Wound: 1 N/A N/A Clustered Quantity: 0.3x0.4x0.5 N/A N/A Measurements L x W x D (cm) 0.094 N/A N/A A (cm) : rea 0.047 N/A N/A Volume (cm) : 100.00% N/A N/A % Reduction in A rea: 100.00% N/A N/A % Reduction in Volume: 11 Position 1 (o'clock): 2.5 Maximum Distance 1 (cm): Yes N/A N/A Tunneling: Full Thickness With Exposed Support N/A N/A Classification: Structures Medium N/A N/A Exudate Amount: Serosanguineous N/A N/A Exudate Type: red, brown N/A N/A Exudate Color: Distinct, outline attached N/A N/A Wound Margin: Large (67-100%) N/A N/A Granulation Amount: Red N/A N/A Granulation Quality: None Present (0%) N/A N/A Necrotic Amount: Fat Layer (Subcutaneous Tissue): Yes N/A N/A Exposed Structures: Fascia: No Tendon: No Muscle: No Joint: No Bone: No Large (67-100%) N/A N/A Epithelialization: Induration: Yes N/A N/A Periwound Skin Texture: Excoriation: No Callus: No Crepitus: No Rash: No Scarring: No Maceration: No N/A N/A Periwound Skin Moisture: Dry/Scaly: No TIMMI, DEVORA ([id number]) [id number] Page 5 of 9 Hemosiderin Staining: Yes N/A N/A Periwound Skin Color: Atrophie Blanche: No Cyanosis: No Ecchymosis: No Erythema: No Mottled:  No Pallor: No Rubor: No No Abnormality N/A N/A Temperature: Yes N/A N/A Tenderness on Palpation: Treatment Notes Wound #1 (Lower Leg) Wound Laterality: Left, Lateral Cleanser Wound Cleanser Discharge Instruction: Cleanse the wound with wound cleanser prior to applying a clean dressing using gauze sponges, not tissue or cotton balls. Peri-Wound Care Ketoconazole Cream 2% Discharge Instruction: Apply to periwound in clinic. you use clobetasol at home. Topical Primary Dressing Plain packing strip 1/2 (in) Discharge Instruction: Lightly pack as instructed Compounding topical antibiotics Discharge Instruction: apply antibiotic moisted gauze lightly packed into wound bed. Secondary Dressing ABD Pad, 5x9 Discharge Instruction: Apply over primary dressing as directed. Woven Gauze Sponge, Non-Sterile 4x4 in Discharge Instruction: Apply over primary dressing as directed. Secured With [employer], 4.5x3.1 (in/yd) Discharge Instruction: Secure with Kerlix as directed. 85M Medipore H Soft Cloth Surgical T ape, 4 x 10 (in/yd) Discharge Instruction: Secure with tape as directed. Compression Wrap ace wrap Compression Stockings Add-Ons Electronic Signature(s) Signed: 07/12/2022 12:46:27 PM By: Kalman Shan DO Entered By: Kalman Shan on 07/12/2022 11:26:24 -------------------------------------------------------------------------------- Multi-Disciplinary Care Plan Details Patient Name: Date of Service: Christine Debar [name] 07/12/2022 10:45 A M Medical Record Number: [id number] Patient Account Number: [id number] Date of Birth/Sex: Treating RN: 01/02/1951 (72 y.o. [name] Primary Care Trini Soldo: Deland Pretty Other Clinician: Referring Hollyn Stucky: Treating Chamara Dyck/Extender: Judie Grieve in Treatment: [address] [name], [name] ([id number]) [id number] Page 6 of 9 Abuse / Safety / Falls / Self Care  Management Nursing Diagnoses: History of Falls Potential for falls Goals: Patient/caregiver will verbalize/demonstrate measure taken to improve self care Date Initiated: 11/19/2021 Date Inactivated: 04/05/2022 Target Resolution Date: 04/21/2022 Goal Status: Met Patient/caregiver will verbalize/demonstrate measures taken to prevent injury and/or falls Date Initiated: 11/19/2021 Target Resolution Date: 07/22/2022 Goal Status: Active Interventions: Provide education on basic hygiene Provide education on fall prevention Provide education on HBO safety Provide education on vaccinations Treatment Activities: Education provided on Basic Hygiene : 05/17/2022 Notes: Pain, Acute or Chronic Nursing Diagnoses: Pain, acute or chronic: actual or potential Potential alteration in comfort, pain Goals: Patient will verbalize adequate pain control and receive pain control interventions during procedures as needed Date Initiated: 11/19/2021 Date Inactivated: 04/05/2022 Target Resolution Date: 04/22/2022 Goal Status: Met Patient/caregiver will verbalize comfort level met Date Initiated: 11/19/2021 Target Resolution Date:  07/22/2022 Goal Status: Active Interventions: Encourage patient to take pain medications as prescribed Provide education on pain management Reposition patient for comfort Treatment Activities: Administer pain control measures as ordered : 11/19/2021 Notes: Electronic Signature(s) Signed: 07/13/2022 10:43:44 AM By: Deon Pilling RN, BSN Entered By: Deon Pilling on 07/12/2022 10:09:00 -------------------------------------------------------------------------------- Pain Assessment Details Patient Name: Date of Service: Christine Simmonds J. 07/12/2022 10:45 A M Medical Record Number: [id number] Patient Account Number: [id number] Date of Birth/Sex: Treating RN: Jun 15, 1951 (71 y.o. F) Primary Care Mekiyah Gladwell: Deland Pretty Other Clinician: Referring Esteban Kobashigawa: Treating  Gloris Shiroma/Extender: Judie Grieve in Treatment: 33 Active Problems Location of Pain Severity and Description of Pain Patient Has Paino No Site Locations [name], [name] ([id number]) [id number] Page 7 of 9 Pain Management and Medication Current Pain Management: Electronic Signature(s) Signed: 07/12/2022 4:33:14 PM By: Erenest Blank Entered By: Erenest Blank on 07/12/2022 10:49:59 -------------------------------------------------------------------------------- Patient/Caregiver Education Details Patient Name: Date of Service: [name] 11/21/2023andnbsp10:45 A M Medical Record Number: [id number] Patient Account Number: [id number] Date of Birth/Gender: Treating RN: 1950/11/19 (71 y.o. [name] Primary Care Physician: Deland Pretty Other Clinician: Referring Physician: Treating Physician/Extender: Judie Grieve in Treatment: 64 Education Assessment Education Provided To: Patient Education Topics Provided Pain: Handouts: A Guide to Pain Control Methods: Explain/Verbal Responses: Reinforcements needed Electronic Signature(s) Signed: 07/13/2022 10:43:44 AM By: Deon Pilling RN, BSN Entered By: Deon Pilling on 07/12/2022 10:09:13 -------------------------------------------------------------------------------- Wound Assessment Details Patient Name: Date of Service: Christine Simmonds J. 07/12/2022 10:45 A M Medical Record Number: [id number] Patient Account Number: [id number] Date of Birth/Sex: Treating RN: 11/21/1950 (71 y.o. F) Primary Care Jonavin Seder: Deland Pretty Other Clinician: TIERSA, [name] ([id number]) [id number] Page 8 of 9 Referring Naiomy Watters: Treating Shemia Bevel/Extender: Judie Grieve in Treatment: 33 Wound Status Wound Number: 1 Primary Etiology: Trauma, Other Wound Location: Left, Lateral Lower Leg Wound Status:  Open Wounding Event: Trauma Comorbid History: Congestive Heart Failure, Hypertension Date Acquired: 10/18/2021 Weeks Of Treatment: 33 Clustered Wound: Yes Photos Wound Measurements Length: (cm) Width: (cm) Depth: (cm) Clustered Quantity: Area: (cm) Volume: (cm) 0.3 % Reduction in Area: 100% 0.4 % Reduction in Volume: 100% 0.5 Epithelialization: Large (67-100%) 1 Tunneling: Yes 0.094 Position (o'clock): 11 0.047 Maximum Distance: (cm) 2.5 Undermining: No Wound Description Classification: Full Thickness With Exposed Support Structures Wound Margin: Distinct, outline attached Exudate Amount: Medium Exudate Type: Serosanguineous Exudate Color: red, brown Foul Odor After Cleansing: No Slough/Fibrino No Wound Bed Granulation Amount: Large (67-100%) Exposed Structure Granulation Quality: Red Fascia Exposed: No Necrotic Amount: None Present (0%) Fat Layer (Subcutaneous Tissue) Exposed: Yes Tendon Exposed: No Muscle Exposed: No Joint Exposed: No Bone Exposed: No Periwound Skin Texture Texture Color No Abnormalities Noted: No No Abnormalities Noted: No Callus: No Atrophie Blanche: No Crepitus: No Cyanosis: No Excoriation: No Ecchymosis: No Induration: Yes Erythema: No Rash: No Hemosiderin Staining: Yes Scarring: No Mottled: No Pallor: No Moisture Rubor: No No Abnormalities Noted: No Dry / Scaly: No Temperature / Pain Maceration: No Temperature: No Abnormality Tenderness on Palpation: Yes Treatment Notes Wound #1 (Lower Leg) Wound Laterality: Left, Lateral Cleanser Wound Cleanser Discharge Instruction: Cleanse the wound with wound cleanser prior to applying a clean dressing using gauze sponges, not tissue or cotton balls. BOBBIJO, HOLST ([id number]) [id number] Page 9 of 9 Peri-Wound Care Ketoconazole Cream 2% Discharge Instruction: Apply to periwound in clinic. you use clobetasol at home. Topical Primary Dressing Plain  packing strip 1/2 (in) Discharge Instruction: Lightly pack  as instructed Compounding topical antibiotics Discharge Instruction: apply antibiotic moisted gauze lightly packed into wound bed. Secondary Dressing ABD Pad, 5x9 Discharge Instruction: Apply over primary dressing as directed. Woven Gauze Sponge, Non-Sterile 4x4 in Discharge Instruction: Apply over primary dressing as directed. Secured With [employer], 4.5x3.1 (in/yd) Discharge Instruction: Secure with Kerlix as directed. 45M Medipore H Soft Cloth Surgical T ape, 4 x 10 (in/yd) Discharge Instruction: Secure with tape as directed. Compression Wrap ace wrap Compression Stockings Add-Ons Electronic Signature(s) Signed: 07/12/2022 4:33:14 PM By: Erenest Blank Entered By: Erenest Blank on 07/12/2022 10:56:28 -------------------------------------------------------------------------------- Vitals Details Patient Name: Date of Service: [name], Christine [name] 07/12/2022 10:45 A M Medical Record Number: [id number] Patient Account Number: [id number] Date of Birth/Sex: Treating RN: [birth year] (71 y.o. F) Primary Care Tatem Holsonback: Deland Pretty Other Clinician: Referring Coda Filler: Treating Caliya Narine/Extender: Judie Grieve in Treatment: 33 Vital Signs Time Taken: 10:40 Temperature (F): 98.1 Height (in): 67 Pulse (bpm): 46 Weight (lbs): 340 Respiratory Rate (breaths/min): 18 Body Mass Index (BMI): 53.2 Blood Pressure (mmHg): 157/82 Reference Range: 80 - 120 mg / dl Electronic Signature(s) Signed: 07/12/2022 4:33:14 PM By: Erenest Blank Entered By: Erenest Blank on 07/12/2022 10:43:26

## 2022-07-19 ENCOUNTER — Ambulatory Visit (HOSPITAL_BASED_OUTPATIENT_CLINIC_OR_DEPARTMENT_OTHER): Payer: PPO | Admitting: Internal Medicine

## 2022-07-20 DIAGNOSIS — Z6841 Body Mass Index (BMI) 40.0 and over, adult: Secondary | ICD-10-CM | POA: Diagnosis not present

## 2022-07-20 DIAGNOSIS — F325 Major depressive disorder, single episode, in full remission: Secondary | ICD-10-CM | POA: Diagnosis not present

## 2022-07-20 DIAGNOSIS — I110 Hypertensive heart disease with heart failure: Secondary | ICD-10-CM | POA: Diagnosis not present

## 2022-07-20 DIAGNOSIS — I5032 Chronic diastolic (congestive) heart failure: Secondary | ICD-10-CM | POA: Diagnosis not present

## 2022-07-20 DIAGNOSIS — Z9181 History of falling: Secondary | ICD-10-CM | POA: Diagnosis not present

## 2022-07-20 DIAGNOSIS — D500 Iron deficiency anemia secondary to blood loss (chronic): Secondary | ICD-10-CM | POA: Diagnosis not present

## 2022-07-20 DIAGNOSIS — Z9049 Acquired absence of other specified parts of digestive tract: Secondary | ICD-10-CM | POA: Diagnosis not present

## 2022-07-20 DIAGNOSIS — Z96653 Presence of artificial knee joint, bilateral: Secondary | ICD-10-CM | POA: Diagnosis not present

## 2022-07-20 DIAGNOSIS — I4891 Unspecified atrial fibrillation: Secondary | ICD-10-CM | POA: Diagnosis not present

## 2022-07-20 DIAGNOSIS — E039 Hypothyroidism, unspecified: Secondary | ICD-10-CM | POA: Diagnosis not present

## 2022-07-20 DIAGNOSIS — Z7901 Long term (current) use of anticoagulants: Secondary | ICD-10-CM | POA: Diagnosis not present

## 2022-07-20 DIAGNOSIS — J309 Allergic rhinitis, unspecified: Secondary | ICD-10-CM | POA: Diagnosis not present

## 2022-07-20 DIAGNOSIS — T24332D Burn of third degree of left lower leg, subsequent encounter: Secondary | ICD-10-CM | POA: Diagnosis not present

## 2022-07-21 ENCOUNTER — Encounter (HOSPITAL_BASED_OUTPATIENT_CLINIC_OR_DEPARTMENT_OTHER): Payer: PPO | Admitting: Internal Medicine

## 2022-07-21 DIAGNOSIS — L97822 Non-pressure chronic ulcer of other part of left lower leg with fat layer exposed: Secondary | ICD-10-CM

## 2022-07-21 DIAGNOSIS — I87312 Chronic venous hypertension (idiopathic) with ulcer of left lower extremity: Secondary | ICD-10-CM

## 2022-07-21 DIAGNOSIS — E039 Hypothyroidism, unspecified: Secondary | ICD-10-CM | POA: Diagnosis not present

## 2022-07-21 DIAGNOSIS — T798XXA Other early complications of trauma, initial encounter: Secondary | ICD-10-CM

## 2022-07-21 DIAGNOSIS — I10 Essential (primary) hypertension: Secondary | ICD-10-CM | POA: Diagnosis not present

## 2022-07-21 DIAGNOSIS — I890 Lymphedema, not elsewhere classified: Secondary | ICD-10-CM

## 2022-07-21 DIAGNOSIS — I480 Paroxysmal atrial fibrillation: Secondary | ICD-10-CM | POA: Diagnosis not present

## 2022-07-21 DIAGNOSIS — E669 Obesity, unspecified: Secondary | ICD-10-CM | POA: Diagnosis not present

## 2022-07-21 NOTE — Progress Notes (Signed)
[name], AHN ([id number]) [id number] Page 1 of 9 Visit Report for 07/21/2022 Chief Complaint Document Details Patient Name: Date of Service: [name] 07/21/2022 11:15 A M Medical Record Number: [id number] Patient Account Number: [id number] Date of Birth/Sex: Treating RN: 07-02-1951 (71 y.o. F) Primary Care Provider: [name] Other Clinician: Referring Provider: Treating Provider/Extender: Annamary Rummage in Treatment: 93 Information Obtained from: Patient Chief Complaint 11/19/2021; Left lower extremity wound status post fall Electronic Signature(s) Signed: 07/21/2022 4:36:48 PM By: Geralyn Corwin DO Entered By: Geralyn Corwin on 07/21/2022 13:18:48 -------------------------------------------------------------------------------- HPI Details Patient Name: Date of Service: Christine Rolling [name] [name]. 07/21/2022 11:15 A M Medical Record Number: [id number] Patient Account Number: [id number] Date of Birth/Sex: Treating RN: [birth year] (71 y.o. F) Primary Care Provider: [name] Other Clinician: Referring Provider: Treating Provider/Extender: Annamary Rummage in Treatment: 34 History of Present Illness HPI Description: Admission 11/19/2021 [name] is a 71 year old female with a past medical history of paroxysmal A-fib on Eliquis, hypothyroidism, major depressive disorder, venous insufficiency and chronic diastolic heart failure that presents to the clinic for a 1 month history of nonhealing wound to the left lower extremity. She visited the ED on 10/18/2021 after a mechanical fall. She developed a hematoma that subsequently opened. She was hospitalized for 7 days and discharged on 10/25/2021. She has been on several different antibiotics For the past month. She states that most recently she was on Levaquin and linezolid for the past week. She states she completes her antibiotic course  tomorrow. She has been using Dakin's wet-to-dry dressings to the wound bed. She denies signs of infection. 4/7; patient presents for follow-up. She has been using Dakin's wet-to-dry dressings. She did end up going to the ED on 4/1 because she had excess bleeding with dressing change that she could not stop. She is on Eliquis for A-fib. In the ED they tied off a small artery. She has had Christine issues since discharge. She denies signs of infection. 4/14; this is a very difficult clinical situation. A patient with underlying chronic venous insufficiency and lymphedema very significant lower extremity edema had a hematoma after a fall on her left upper lateral lower leg. She is on Eliquis for atrial fibrillation apparently with a history of a splenic infarct following with Dr. Berton Mount of cardiology. She has exhibited significant bleeding from the wound surface including ao Venous bleeder that required suturing short while ago. She has been using Dakin's wet-to-dry packing and over the surface of the wound. She saw Dr. Graciela Husbands yesterday he is reluctant to consider stopping the Eliquis because of the prior history of presumed cardioembolism. Wants to communicate with Dr. Mikey Bussing when she returns. In a perfect world where she was not on Eliquis she requires a wound VAC with additional compression wraps but I understand the reluctance to do this because of the concerns of bleeding 4/28; the patient's wound actually looks better today using Dakin's wet-to-dry that she is changing twice a day she is wrapping this with Kerlix and Ace wrapping. She tells me she had 2 small bleeding areas which were part of the superficial wound that stopped this week with direct pressure. Other than that Christine major issues. In follow-up from discussion of last week Dr. 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  The bigger question from my point of view is the wound VAC issue. As far as she knows and her daughter-in-law verifies that she has not had any bleeding from the deeper parts of the wound although the bleeding has been superficial including the one that sent her to the ER for stitches. She is concerned that a wound VAC would cause further bleeding and I cannot completely allay those concerns. [name], [name] ([id number]) [id number] Page 2 of 9 5/8; patient presents for follow-up. She has Christine issues or complaints today. She has been using Dakin's wet-to-dry dressings without issues. She denies signs of infection. 5/12; patient presents for follow-up. She continues to use Dakin's wet-to-dry dressings without issues. She denies signs of infection. She reports some issues with bleeding at times but this has improved. She denies signs of infection. 6/1; patient presents for follow-up. She was recently hospitalized for upper left leg thigh cellulitis. She was given IV cefepime and vancomycin and discharged on oral antibiotics. She has been using Dakin's wet-to-dry dressings to the left lower leg wound. She reports improvement in healing. She currently denies systemic signs of infection. 6/7; patient is using Dakin's wet-to-dry twice daily. In general this looks better than when I saw this a month or so ago however still considerable depth to the tunnel in her left leg. She is going for iron infusions ordered by her primary care doctor I believe 6/15; patient presents for follow-up. She has been using Dakin's wet-to-dry dressings. She has noted some scattered small areas that blister up and heal on her left lower leg. She has been using mupirocin ointment on them. Nothing open today. 6/23; patient's been using Dakin's wet-to-dry dressings to the tunneled wound and Hydrofera Blue to the opening. She has Christine issues or complaints  today. 6/29; patient presents for follow-up. She has been using Dakin's wet-to-dry dressings to the tunneled wound and Hydrofera Blue to the opening. She states that [facility] is sticking to the wound bed. She denies signs of infection. 7/7; patient presents for follow-up. She has been using Dakin's wet-to-dry dressings to the tunneled wound and PolyMem silver to the opening without issues. 7/13; patient presents for follow-up. She has been using PolyMem silver to the opening and the rope to the tunnel. She has Christine issues or complaints today. She denies signs of infection. 7/20; patient presents for follow-up. We have been using PolyMem silver to the wound bed. She has Christine issues or complaints today. She is receiving her custom compression garments tomorrow. 7/27; patient presents for follow-up. She has been using PolyMem silver to the wound bed. She is having her custom compression garments adjusted as these are not staying on very easily. 8/30; patient presents for follow-up. We have been using PolyMem silver to the wound bed. She is still waiting on her custom compression garments to be ordered. She denies signs of infection. 8/11; patient presents for follow-up. The wound VAC has been started and patient has been using this for the past week. Patient has home health who changes the wound VAC. She developed irritation to the periwound. DuoDERM was not being used to the periwound despite being ordered. 8/15; patient presents for follow-up. She restarted the wound VAC and has had improvement to the periwound with the use of DuoDERM. 8/24; patient presents for follow-up. She has been using the wound VAC. She has developed a wound just superior to the original wound. Likely as a result from the wound VAC. She denies signs of infection. 8/29;  patient presents for follow-up. We have been using collagen to the wound bed. She has held off on the wound VAC for the past week. 9/7; patient presents for  follow-up. We have been using iodoform packing to the wound bed. She has mild tenderness to the periwound. She denies systemic signs of infection. 9/12; patient presents for follow-up. Patient has been using Dakin's wet-to-dry dressings. She has Christine issues or complaints today. She denies signs of infection. 9/21; patient presents for follow-up. PuraPly #1 was placed in standard fashion at last clinic visit. She has Christine issues or complaints today. She denies signs of infection. 10/3; patient presents for follow-up. PuraPly #2 was placed in standard fashion at last clinic visit. She has Christine issues or complaints today. She denies signs of infection. 10/10; patient presents for follow-up. PuraPly #3 was placed in standard fashion at last clinic visit. She has Christine issues or complaints today. She denies signs of infection. 10/17; patient presents for follow-up. She has been using silver alginate to the tunneled wound. She has Christine issues or complaints today. 10/31; patient presents for follow-up. We have been doing Dakin's wet-to-dry dressings to the tunneled wound. The wound VAC has been ordered however patient has not received this. She currently denies signs of infection. She reports a lot of serosanguineous drainage. 11/14; patient presents for follow-up. She had a PCR culture done at last clinic visit that grew Pseudomonas aeruginosa. She obtained Keystone antibiotic spray yesterday. She also obtained her ultrasound of the left lower extremity to assess for abscess however the vascular lab ordered On erroneous test. This has been rectified. Patient was not charged for the test. DVT study showed Christine DVT T also reported to our nurse that they did not notice any abscess s. [name] while doing the DVT rule out. 11/21; patient presents for follow-up. She has been using Keystone antibiotic with gauze packing. She has Christine issues or complaints today. 11/30; patient presents for follow-up. She has been using Keystone  antibiotic with gauze packing. Depth is slightly larger today. She denies signs of infection. Electronic Signature(s) Signed: 07/21/2022 4:36:48 PM By: Geralyn Corwin DO Entered By: Geralyn Corwin on 07/21/2022 13:19:11 -------------------------------------------------------------------------------- Physical Exam Details Patient Name: Date of Service: Christine Rolling [name] [name]. 07/21/2022 11:15 A Salome Holmes ([id number]) [id number] Page 3 of 9 Medical Record Number: [id number] Patient Account Number: [id number] Date of Birth/Sex: Treating RN: 09-Nov-1950 (71 y.o. F) Primary Care Provider: [name] Other Clinician: Referring Provider: Treating Provider/Extender: Annamary Rummage in Treatment: 34 Constitutional respirations regular, non-labored and within target range for patient.. Cardiovascular 2+ dorsalis pedis/posterior tibialis pulses. Psychiatric pleasant and cooperative. Notes Left lower extremity: Open wound with granulation tissue at the opening with a depth of 3.5 cm. Christine increased warmth, erythema or purulent drainage. Christine tenderness on palpation. Electronic Signature(s) Signed: 07/21/2022 4:36:48 PM By: Geralyn Corwin DO Entered By: Geralyn Corwin on 07/21/2022 13:19:45 -------------------------------------------------------------------------------- Physician Orders Details Patient Name: Date of Service: Christine Rolling [name] [name]. 07/21/2022 11:15 A M Medical Record Number: [id number] Patient Account Number: [id number] Date of Birth/Sex: Treating RN: 1950-12-02 (71 y.o. Ardis Rowan, [name] Primary Care Provider: [name] Other Clinician: Referring Provider: Treating Provider/Extender: Annamary Rummage in Treatment: 37 Verbal / Phone Orders: Christine Diagnosis Coding Follow-up Appointments ppointment in 1 week. - Dr. Mikey Bussing Return A Other: - Continue Topical antibiotic ointment. use  clobetasol antifungal at home around the wound. Anesthetic (In clinic) Topical Lidocaine 5% applied to wound bed  Cellular or Tissue Based Products Wound #1 Left,Lateral Lower Leg Cellular or Tissue Based Product Type: - 05/12/2022 Puraply AM #1 applied. 05/17/22 PURAPLY AM # 2 05/24/2022 Puraply AM #3 05/31/2022 HOLD Bathing/ Shower/ Hygiene May shower with protection but do not get wound dressing(s) wet. Edema Control - Lymphedema / SCD / Other Elevate legs to the level of the heart or above for 30 minutes daily and/or when sitting, a frequency of: - 3-4 times a day throughout the day. Avoid standing for long periods of time. Moisturize legs daily. - both legs every night before bed. Home Health New wound care orders this week; continue Home Health for wound care. May utilize formulary equivalent dressing for wound treatment orders unless otherwise specified. - compounding topical antibiotic, pack wound, cover with ABD, secure with kerlix and Ace wrap. home health weekly. Other Home Health Orders/Instructions: - [facility] Wound Treatment Wound #1 - Lower Leg Wound Laterality: Left, Lateral Cleanser: Wound Cleanser (Home Health) 1 x Per Day/30 Days Discharge Instructions: Cleanse the wound with wound cleanser prior to applying a clean dressing using gauze sponges, not tissue or cotton balls. [name], [name] ([id number]) [id number] Page 4 of 9 Peri-Wound Care: Ketoconazole Cream 2% 1 x Per Day/30 Days Discharge Instructions: Apply to periwound in clinic. you use clobetasol at home. Topical: keystone 1 x Per Day/30 Days Discharge Instructions: Soak collagen in the keystone and pack into wound. Collage may be cut into thin strip. Prim Dressing: Promogran Prisma Matrix, 4.34 (sq in) (silver collagen) (DME) (Generic) 1 x Per Day/30 Days ary Discharge Instructions: Moisten collagen with saline or hydrogel Prim Dressing: Compounding topical antibiotics 1 x  Per Day/30 Days ary Discharge Instructions: apply antibiotic moisted gauze lightly packed into wound bed. Secondary Dressing: ABD Pad, 5x9 1 x Per Day/30 Days Discharge Instructions: Apply over primary dressing as directed. Secondary Dressing: Woven Gauze Sponge, Non-Sterile 4x4 in (Home Health) 1 x Per Day/30 Days Discharge Instructions: Apply over primary dressing as directed. Secured With: [employer], 4.5x3.1 (in/yd) (Home Health) 1 x Per Day/30 Days Discharge Instructions: Secure with Kerlix as directed. Secured With: 37M Medipore H Soft Cloth Surgical T ape, 4 x 10 (in/yd) (Home Health) 1 x Per Day/30 Days Discharge Instructions: Secure with tape as directed. Compression Wrap: ace wrap 1 x Per Day/30 Days Electronic Signature(s) Signed: 07/21/2022 4:36:48 PM By: Geralyn Corwin DO Entered By: Geralyn Corwin on 07/21/2022 13:19:54 -------------------------------------------------------------------------------- Problem List Details Patient Name: Date of Service: Christine Rolling [name] [name]. 07/21/2022 11:15 A M Medical Record Number: [id number] Patient Account Number: [id number] Date of Birth/Sex: Treating RN: August 04, 1951 (71 y.o. 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/31/2023 Christine Yes T79.8XXA Other early complications of trauma, initial encounter 11/19/2021 Christine Yes I87.312 Chronic venous hypertension (idiopathic) with ulcer of left lower extremity 04/28/2022 Christine Yes I89.0 Lymphedema, not elsewhere classified 04/28/2022 Christine Yes I48.0 Paroxysmal atrial fibrillation 11/19/2021 Christine Yes [name], [name] ([id number]) [id number].pdf Page 5 of 9 Z79.01 Long term (current) use of  anticoagulants 11/19/2021 Christine Yes Inactive Problems Resolved Problems Electronic Signature(s) Signed: 07/21/2022 4:36:48 PM By: Geralyn Corwin DO Entered By: Geralyn Corwin on 07/21/2022 13:18:36 -------------------------------------------------------------------------------- Progress Note Details Patient Name: Date of Service: Christine Rolling [name] [name]. 07/21/2022 11:15 A M Medical Record Number: [id number] Patient  Account Number: [id number] Date of Birth/Sex: Treating RN: 1951/03/10 (71 y.o. F) Primary Care Provider: [name] Other Clinician: Referring Provider: Treating Provider/Extender: Annamary Rummage in Treatment: 66 Subjective Chief Complaint Information obtained from Patient 11/19/2021; Left lower extremity wound status post fall History of Present Illness (HPI) Admission 11/19/2021 Ms. Azariya Freeman is a 71 year old female with a past medical history of paroxysmal A-fib on Eliquis, hypothyroidism, major depressive disorder, venous insufficiency and chronic diastolic heart failure that presents to the clinic for a 1 month history of nonhealing wound to the left lower extremity. She visited the ED on 10/18/2021 after a mechanical fall. She developed a hematoma that subsequently opened. She was hospitalized for 7 days and discharged on 10/25/2021. She has been on several different antibiotics For the past month. She states that most recently she was on Levaquin and linezolid for the past week. She states she completes her antibiotic course tomorrow. She has been using Dakin's wet-to-dry dressings to the wound bed. She denies signs of infection. 4/7; patient presents for follow-up. She has been using Dakin's wet-to-dry dressings. She did end up going to the ED on 4/1 because she had excess bleeding with dressing change that she could not stop. She is on Eliquis for A-fib. In the ED they tied off a small artery. She has had Christine issues since discharge. She denies  signs of infection. 4/14; this is a very difficult clinical situation. A patient with underlying chronic venous insufficiency and lymphedema very significant lower extremity edema had a hematoma after a fall on her left upper lateral lower leg. She is on Eliquis for atrial fibrillation apparently with a history of a splenic infarct following with Dr. Berton Mount of cardiology. She has exhibited significant bleeding from the wound surface including ao Venous bleeder that required suturing short while ago. She has been using Dakin's wet-to-dry packing and over the surface of the wound. She saw Dr. Graciela Husbands yesterday he is reluctant to consider stopping the Eliquis because of the prior history of presumed cardioembolism. Wants to communicate with Dr. Mikey Bussing when she returns. In a perfect world where she was not on Eliquis she requires a wound VAC with additional compression wraps but I understand the reluctance to do this because of the concerns of bleeding 4/28; the patient's wound actually looks better today using Dakin's wet-to-dry that she is changing twice a day she is wrapping this with Kerlix and Ace wrapping. She tells me she had 2 small bleeding areas which were part of the superficial wound that stopped this week with direct pressure. Other than that Christine major issues. In follow-up from discussion of last week Dr. 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 The bigger question from my point of view is the wound VAC issue. As far as she knows and her daughter-in-law verifies that she has not had any bleeding from the deeper parts of the wound although the bleeding has been superficial including the one that sent her to the ER for stitches. She is concerned that a wound VAC would cause further bleeding and I cannot completely allay those  concerns. 5/8; patient presents for follow-up. She has Christine issues or complaints today. She has been using Dakin's wet-to-dry dressings without issues. She denies signs of infection. 5/12; patient presents for follow-up. She continues to use  Dakin's wet-to-dry dressings without issues. She denies signs of infection. She reports some issues with bleeding at times but this has improved. She denies signs of infection. 6/1; patient presents for follow-up. She was recently hospitalized for upper left leg thigh cellulitis. She was given IV cefepime and vancomycin and discharged on oral antibiotics. She has been using Dakin's wet-to-dry dressings to the left lower leg wound. She reports improvement in healing. She currently denies systemic signs of infection. 6/7; patient is using Dakin's wet-to-dry twice daily. In general this looks better than when I saw this a month or so ago however still considerable depth to the tunnel in her left leg. She is going for iron infusions ordered by her primary care doctor I believe 6/15; patient presents for follow-up. She has been using Dakin's wet-to-dry dressings. She has noted some scattered small areas that blister up and heal on her KEAUNA, BRASEL ([id number]) [id number] Page 6 of 9 left lower leg. She has been using mupirocin ointment on them. Nothing open today. 6/23; patient's been using Dakin's wet-to-dry dressings to the tunneled wound and Hydrofera Blue to the opening. She has Christine issues or complaints today. 6/29; patient presents for follow-up. She has been using Dakin's wet-to-dry dressings to the tunneled wound and Hydrofera Blue to the opening. She states that [facility] is sticking to the wound bed. She denies signs of infection. 7/7; patient presents for follow-up. She has been using Dakin's wet-to-dry dressings to the tunneled wound and PolyMem silver to the opening without issues. 7/13; patient presents for follow-up.  She has been using PolyMem silver to the opening and the rope to the tunnel. She has Christine issues or complaints today. She denies signs of infection. 7/20; patient presents for follow-up. We have been using PolyMem silver to the wound bed. She has Christine issues or complaints today. She is receiving her custom compression garments tomorrow. 7/27; patient presents for follow-up. She has been using PolyMem silver to the wound bed. She is having her custom compression garments adjusted as these are not staying on very easily. 8/30; patient presents for follow-up. We have been using PolyMem silver to the wound bed. She is still waiting on her custom compression garments to be ordered. She denies signs of infection. 8/11; patient presents for follow-up. The wound VAC has been started and patient has been using this for the past week. Patient has home health who changes the wound VAC. She developed irritation to the periwound. DuoDERM was not being used to the periwound despite being ordered. 8/15; patient presents for follow-up. She restarted the wound VAC and has had improvement to the periwound with the use of DuoDERM. 8/24; patient presents for follow-up. She has been using the wound VAC. She has developed a wound just superior to the original wound. Likely as a result from the wound VAC. She denies signs of infection. 8/29; patient presents for follow-up. We have been using collagen to the wound bed. She has held off on the wound VAC for the past week. 9/7; patient presents for follow-up. We have been using iodoform packing to the wound bed. She has mild tenderness to the periwound. She denies systemic signs of infection. 9/12; patient presents for follow-up. Patient has been using Dakin's wet-to-dry dressings. She has Christine issues or complaints today. She denies signs of infection. 9/21; patient presents for follow-up. PuraPly #1 was placed in standard fashion at last clinic visit. She has Christine issues or  complaints today. She denies signs of infection.  10/3; patient presents for follow-up. PuraPly #2 was placed in standard fashion at last clinic visit. She has Christine issues or complaints today. She denies signs of infection. 10/10; patient presents for follow-up. PuraPly #3 was placed in standard fashion at last clinic visit. She has Christine issues or complaints today. She denies signs of infection. 10/17; patient presents for follow-up. She has been using silver alginate to the tunneled wound. She has Christine issues or complaints today. 10/31; patient presents for follow-up. We have been doing Dakin's wet-to-dry dressings to the tunneled wound. The wound VAC has been ordered however patient has not received this. She currently denies signs of infection. She reports a lot of serosanguineous drainage. 11/14; patient presents for follow-up. She had a PCR culture done at last clinic visit that grew Pseudomonas aeruginosa. She obtained Keystone antibiotic spray yesterday. She also obtained her ultrasound of the left lower extremity to assess for abscess however the vascular lab ordered On erroneous test. This has been rectified. Patient was not charged for the test. DVT study showed Christine DVT T also reported to our nurse that they did not notice any abscess s. [name] while doing the DVT rule out. 11/21; patient presents for follow-up. She has been using Keystone antibiotic with gauze packing. She has Christine issues or complaints today. 11/30; patient presents for follow-up. She has been using Keystone antibiotic with gauze packing. Depth is slightly larger today. She denies signs of infection. Patient History Medical History Cardiovascular Patient has history of Congestive Heart Failure, Hypertension Hospitalization/Surgery History - Cellulitis left leg- 01/03/2022-01/07/2022. Medical A Surgical History Notes nd Constitutional Symptoms (General Health) Infarction of  spleen Hematologic/Lymphatic Hypothyroidism Cardiovascular A-Fib Gastrointestinal Gastroesophageal reflux Genitourinary Chronic kidney disease Musculoskeletal Osteoarthritis of knee Psychiatric Anxiety [name], [name] ([id number]) [id number] Page 7 of 9 Objective Constitutional respirations regular, non-labored and within target range for patient.. Vitals Time Taken: 11:47 AM, Height: 67 in, Weight: 340 lbs, BMI: 53.2, Temperature: 97.7 F, Pulse: 45 bpm, Respiratory Rate: 18 breaths/min, Blood Pressure: 164/59 mmHg. Cardiovascular 2+ dorsalis pedis/posterior tibialis pulses. Psychiatric pleasant and cooperative. General Notes: Left lower extremity: Open wound with granulation tissue at the opening with a depth of 3.5 cm. Christine increased warmth, erythema or purulent drainage. Christine tenderness on palpation. Integumentary (Hair, Skin) Wound #1 status is Open. Original cause of wound was Trauma. The date acquired was: 10/18/2021. The wound has been in treatment 34 weeks. The wound is located on the Left,Lateral Lower Leg. The wound measures 0.2cm length x 0.3cm width x 0.5cm depth; 0.047cm^2 area and 0.024cm^3 volume. There is Fat Layer (Subcutaneous Tissue) exposed. There is tunneling at 10:00 with a maximum distance of 3.5cm. There is a medium amount of serosanguineous drainage noted. The wound margin is distinct with the outline attached to the wound base. There is large (67-100%) red granulation within the wound bed. There is Christine necrotic tissue within the wound bed. The periwound skin appearance exhibited: Induration, Hemosiderin Staining. The periwound skin appearance did not exhibit: Callus, Crepitus, Excoriation, Rash, Scarring, Dry/Scaly, Maceration, Atrophie Blanche, Cyanosis, Ecchymosis, Mottled, Pallor, Rubor, Erythema. Periwound temperature was noted as Christine Abnormality. The periwound has tenderness on palpation. Assessment Active  Problems ICD-10 Non-pressure chronic ulcer of other part of left lower leg with fat layer exposed Other early complications of trauma, initial encounter Chronic venous hypertension (idiopathic) with ulcer of left lower extremity Lymphedema, not elsewhere classified Paroxysmal atrial fibrillation Long term (current) use of anticoagulants Patient's wound is overall stable. She reports minimal drainage. At this  time I recommended stopping the packing strips and using collagen with the [facility] antibiotic solution ewvery other day to the wound bed.. She would benefit from compression therapy however due to the shape of her legs Tubigrip, office wraps or compression garments do not stay up. Follow-up in 1 week Plan Follow-up Appointments: Return Appointment in 1 week. - Dr. Mikey Bussing Other: - Continue Topical antibiotic ointment. use clobetasol antifungal at home around the wound. Anesthetic: (In clinic) Topical Lidocaine 5% applied to wound bed Cellular or Tissue Based Products: Wound #1 Left,Lateral Lower Leg: Cellular or Tissue Based Product Type: - 05/12/2022 Puraply AM #1 applied. 05/17/22 PURAPLY AM # 2 05/24/2022 Puraply AM #3 05/31/2022 HOLD Bathing/ Shower/ Hygiene: May shower with protection but do not get wound dressing(s) wet. Edema Control - Lymphedema / SCD / Other: Elevate legs to the level of the heart or above for 30 minutes daily and/or when sitting, a frequency of: - 3-4 times a day throughout the day. Avoid standing for long periods of time. Moisturize legs daily. - both legs every night before bed. Home Health: New wound care orders this week; continue Home Health for wound care. May utilize formulary equivalent dressing for wound treatment orders unless otherwise specified. - compounding topical antibiotic, pack wound, cover with ABD, secure with kerlix and Ace wrap. home health weekly. Other Home Health Orders/Instructions: - [facility] WOUND #1: - Lower Leg Wound  Laterality: Left, Lateral Cleanser: Wound Cleanser (Home Health) 1 x Per Day/30 Days Discharge Instructions: Cleanse the wound with wound cleanser prior to applying a clean dressing using gauze sponges, not tissue or cotton balls. Peri-Wound Care: Ketoconazole Cream 2% 1 x Per Day/30 Days Discharge Instructions: Apply to periwound in clinic. you use clobetasol at home. Topical: keystone 1 x Per Day/30 Days Discharge Instructions: Soak collagen in the keystone and pack into wound. Collage may be cut into thin strip. Prim Dressing: Promogran Prisma Matrix, 4.34 (sq in) (silver collagen) (DME) (Generic) 1 x Per Day/30 Days ary Discharge Instructions: Moisten collagen with saline or hydrogel Prim Dressing: Compounding topical antibiotics 1 x Per Day/30 Days ary Discharge Instructions: apply antibiotic moisted gauze lightly packed into wound bed. Secondary Dressing: ABD Pad, 5x9 1 x Per Day/30 Days Discharge Instructions: Apply over primary dressing as directed. Secondary Dressing: Woven Gauze Sponge, Non-Sterile 4x4 in (Home Health) 1 x Per Day/30 Days Discharge Instructions: Apply over primary dressing as directed. [name], [name] ([id number]) [id number] Page 8 of 9 Secured With: [employer], 4.5x3.1 (in/yd) (Home Health) 1 x Per Day/30 Days Discharge Instructions: Secure with Kerlix as directed. Secured With: 59M Medipore H Soft Cloth Surgical T ape, 4 x 10 (in/yd) (Home Health) 1 x Per Day/30 Days Discharge Instructions: Secure with tape as directed. Compression Wrap: ace wrap 1 x Per Day/30 Days 1. Collagen with Keystone antibiotic solution every other day 2. Follow-up in 1 week Electronic Signature(s) Signed: 07/21/2022 4:36:48 PM By: Geralyn Corwin DO Entered By: Geralyn Corwin on 07/21/2022 13:23:55 -------------------------------------------------------------------------------- HxROS Details Patient Name: Date of Service: [name], Christine  [name] [name]. 07/21/2022 11:15 A M Medical Record Number: [id number] Patient Account Number: [id number] Date of Birth/Sex: Treating RN: 20-Jun-1951 (71 y.o. F) Primary Care Provider: [name] Other Clinician: Referring Provider: Treating Provider/Extender: Annamary Rummage in Treatment: 34 Constitutional Symptoms (General Health) Medical History: Past Medical History Notes: Infarction of spleen Hematologic/Lymphatic Medical History: Past Medical History Notes: Hypothyroidism Cardiovascular Medical History: Positive for: Congestive Heart Failure; Hypertension Past Medical History Notes:  A-Fib Gastrointestinal Medical History: Past Medical History Notes: Gastroesophageal reflux Genitourinary Medical History: Past Medical History Notes: Chronic kidney disease Musculoskeletal Medical History: Past Medical History Notes: Osteoarthritis of knee Psychiatric Medical History: Past Medical History Notes: Anxiety Immunizations Pneumococcal Vaccine: Mariel SleetORRIS, [name] ([id number]) [id number] Page 9 of 9 Received Pneumococcal Vaccination: Christine Implantable Devices Christine devices added Hospitalization / Surgery History Type of Hospitalization/Surgery Cellulitis left leg- 01/03/2022-01/07/2022 Electronic Signature(s) Signed: 07/21/2022 4:36:48 PM By: Geralyn CorwinHoffman, Joliene Salvador DO Entered By: Geralyn CorwinHoffman, Quency Tober on 07/21/2022 13:19:22 -------------------------------------------------------------------------------- SuperBill Details Patient Name: Date of Service: Christine MunsonNO [name], [name] [name]. 07/21/2022 Medical Record Number: [id number] Patient Account Number: [id number] Date of Birth/Sex: Treating RN: 05/22/1951 (71 y.o. F) Primary Care Provider: Merri BrunettePharr, Walter Other Clinician: Referring Provider: Treating Provider/Extender: Annamary RummageHoffman, Angellina Ferdinand Pharr, Walter Weeks in Treatment: 34 Diagnosis Coding ICD-10 Codes Code Description [phone] Non-pressure chronic ulcer  of other part of left lower leg with fat layer exposed T79.8XXA Other early complications of trauma, initial encounter I87.312 Chronic venous hypertension (idiopathic) with ulcer of left lower extremity I89.0 Lymphedema, not elsewhere classified I48.0 Paroxysmal atrial fibrillation Z79.01 Long term (current) use of anticoagulants Physician Procedures : CPT4 Code Description Modifier [id number] - WC PHYS LEVEL 3 - EST PT ICD-10 Diagnosis Description L97.822 Non-pressure chronic ulcer of other part of left lower leg with fat layer exposed T79.8XXA Other early complications of trauma, initial  encounter I87.312 Chronic venous hypertension (idiopathic) with ulcer of left lower extremity I89.0 Lymphedema, not elsewhere classified Quantity: 1 Electronic Signature(s) Signed: 07/21/2022 4:36:48 PM By: Geralyn CorwinHoffman, Kindle Strohmeier DO Entered By: Geralyn CorwinHoffman, Oseas Detty on 07/21/2022 13:24:11

## 2022-07-22 NOTE — Progress Notes (Signed)
ARLET, MARTER ([id number]) [id number] Page 1 of 7 Visit Report for 07/21/2022 Arrival Information Details Patient Name: Date of Service: [name] 07/21/2022 11:15 A M Medical Record Number: [id number] Patient Account Number: [id number] Date of Birth/Sex: Treating RN: 07-Oct-1950 (71 y.o. F) Primary Care Bristal Steffy: Deland Pretty Other Clinician: Referring Keaton Stirewalt: Treating Kriston Mckinnie/Extender: Judie Grieve in Treatment: 38 Visit Information History Since Last Visit Added or deleted any medications: No Patient Arrived: Walker Any new allergies or adverse reactions: No Arrival Time: 11:44 Had a fall or experienced change in No Accompanied By: son activities of daily living that may affect Transfer Assistance: None risk of falls: Patient Requires Transmission-Based Precautions: No Signs or symptoms of abuse/neglect since last visito No Patient Has Alerts: Yes Hospitalized since last visit: No Patient Alerts: Patient on Blood Thinner Implantable device outside of the clinic excluding No cellular tissue based products placed in the center since last visit: Has Dressing in Place as Prescribed: Yes Pain Present Now: No Electronic Signature(s) Signed: 07/22/2022 1:02:55 PM By: Erenest Blank Entered By: Erenest Blank on 07/21/2022 11:46:38 -------------------------------------------------------------------------------- Encounter Discharge Information Details Patient Name: Date of Service: Christine Debar [name] 07/21/2022 11:15 A M Medical Record Number: [id number] Patient Account Number: [id number] Date of Birth/Sex: Treating RN: [birth year] (71 y.o. [name], [name] Primary Care Vesta Wheeland: Deland Pretty Other Clinician: Referring Marlisha Vanwyk: Treating Kionna Brier/Extender: Judie Grieve in Treatment: 44 Encounter Discharge Information Items Discharge Condition: Stable Ambulatory Status:  Ambulatory Discharge Destination: Home Transportation: Private Auto Accompanied By: self Schedule Follow-up Appointment: Yes Clinical Summary of Care: Patient Declined Electronic Signature(s) Signed: 07/21/2022 5:26:06 PM By: Rhae Hammock RN Entered By: Rhae Hammock on 07/21/2022 16:36:07 Arakelian, Mart Piggs ([id number]) [id number] Page 2 of 7 -------------------------------------------------------------------------------- Lower Extremity Assessment Details Patient Name: Date of Service: [name] 07/21/2022 11:15 A M Medical Record Number: [id number] Patient Account Number: [id number] Date of Birth/Sex: Treating RN: Dec 07, 1950 (71 y.o. F) Primary Care Atianna Haidar: Deland Pretty Other Clinician: Referring Ronda Rajkumar: Treating Alexus Galka/Extender: Judie Grieve in Treatment: 34 Edema Assessment Assessed: Shirlyn Goltz: No] Patrice Paradise: No] Edema: [Left: Ye] [Right: s] Calf Left: Right: Point of Measurement: 31 cm From Medial Instep 48.4 cm Ankle Left: Right: Point of Measurement: 9 cm From Medial Instep 26.5 cm Electronic Signature(s) Signed: 07/22/2022 1:02:55 PM By: Erenest Blank Entered By: Erenest Blank on 07/21/2022 11:50:39 -------------------------------------------------------------------------------- Multi Wound Chart Details Patient Name: Date of Service: Christine Simmonds J. 07/21/2022 11:15 A M Medical Record Number: [id number] Patient Account Number: [id number] Date of Birth/Sex: Treating RN: 1951/08/05 (71 y.o. F) Primary Care Everhett Bozard: Deland Pretty Other Clinician: Referring Tyris Eliot: Treating Marlie Kuennen/Extender: Judie Grieve in Treatment: 34 Vital Signs Height(in): 67 Pulse(bpm): 45 Weight(lbs): 340 Blood Pressure(mmHg): 164/59 Body Mass Index(BMI): 53.2 Temperature(F): 97.7 Respiratory Rate(breaths/min): 18 [1:Photos:] [N/A:N/A] Left, Lateral Lower Leg N/A N/A Wound  Location: Trauma N/A N/A Wounding Event: Trauma, Other N/A N/A Primary Etiology: Congestive Heart Failure, N/A N/A Comorbid History: Hypertension 10/18/2021 N/A N/A Date Acquired: DANISE, DEHNE ([id number]) [id number] Page 3 of 7 34 N/A N/A Weeks of Treatment: Open N/A N/A Wound Status: No N/A N/A Wound Recurrence: Yes N/A N/A Clustered Wound: 1 N/A N/A Clustered Quantity: 0.2x0.3x0.5 N/A N/A Measurements L x W x D (cm) 0.047 N/A N/A A (cm) : rea 0.024 N/A N/A Volume (cm) : 100.00% N/A N/A % Reduction in A rea: 100.00% N/A N/A % Reduction in Volume: 10 Position 1 (o'clock): 3.5  Maximum Distance 1 (cm): Yes N/A N/A Tunneling: Full Thickness With Exposed Support N/A N/A Classification: Structures Medium N/A N/A Exudate Amount: Serosanguineous N/A N/A Exudate Type: red, brown N/A N/A Exudate Color: Distinct, outline attached N/A N/A Wound Margin: Large (67-100%) N/A N/A Granulation Amount: Red N/A N/A Granulation Quality: None Present (0%) N/A N/A Necrotic Amount: Fat Layer (Subcutaneous Tissue): Yes N/A N/A Exposed Structures: Fascia: No Tendon: No Muscle: No Joint: No Bone: No Large (67-100%) N/A N/A Epithelialization: Induration: Yes N/A N/A Periwound Skin Texture: Excoriation: No Callus: No Crepitus: No Rash: No Scarring: No Maceration: No N/A N/A Periwound Skin Moisture: Dry/Scaly: No Hemosiderin Staining: Yes N/A N/A Periwound Skin Color: Atrophie Blanche: No Cyanosis: No Ecchymosis: No Erythema: No Mottled: No Pallor: No Rubor: No No Abnormality N/A N/A Temperature: Yes N/A N/A Tenderness on Palpation: Treatment Notes Electronic Signature(s) Signed: 07/21/2022 4:36:48 PM By: Kalman Shan DO Entered By: Kalman Shan on 07/21/2022 13:18:41 -------------------------------------------------------------------------------- Multi-Disciplinary Care Plan Details Patient Name: Date of  Service: Christine Debar [name] 07/21/2022 11:15 A M Medical Record Number: [id number] Patient Account Number: [id number] Date of Birth/Sex: Treating RN: 10/03/1950 (71 y.o. [name], [name] Primary Care Sherri Levenhagen: Deland Pretty Other Clinician: Referring Tracy Kinner: Treating Yamen Castrogiovanni/Extender: Judie Grieve in Treatment: 84 Active Inactive Abuse / Safety / Falls / Self Care Management Nursing Diagnoses: History of Falls Potential for falls [name], [name] ([id number]) [phone].pdf Page 4 of 7 Goals: Patient/caregiver will verbalize/demonstrate measure taken to improve self care Date Initiated: 11/19/2021 Date Inactivated: 04/05/2022 Target Resolution Date: 04/21/2022 Goal Status: Met Patient/caregiver will verbalize/demonstrate measures taken to prevent injury and/or falls Date Initiated: 11/19/2021 Target Resolution Date: 07/22/2022 Goal Status: Active Interventions: Provide education on basic hygiene Provide education on fall prevention Provide education on HBO safety Provide education on vaccinations Treatment Activities: Education provided on Basic Hygiene : 05/17/2022 Notes: Pain, Acute or Chronic Nursing Diagnoses: Pain, acute or chronic: actual or potential Potential alteration in comfort, pain Goals: Patient will verbalize adequate pain control and receive pain control interventions during procedures as needed Date Initiated: 11/19/2021 Date Inactivated: 04/05/2022 Target Resolution Date: 04/22/2022 Goal Status: Met Patient/caregiver will verbalize comfort level met Date Initiated: 11/19/2021 Target Resolution Date: 07/22/2022 Goal Status: Active Interventions: Encourage patient to take pain medications as prescribed Provide education on pain management Reposition patient for comfort Treatment Activities: Administer pain control measures as ordered : 11/19/2021 Notes: Electronic Signature(s) Signed: 07/21/2022  5:26:06 PM By: Rhae Hammock RN Entered By: Rhae Hammock on 07/21/2022 12:09:31 -------------------------------------------------------------------------------- Pain Assessment Details Patient Name: Date of Service: Christine Simmonds J. 07/21/2022 11:15 A M Medical Record Number: [id number] Patient Account Number: [id number] Date of Birth/Sex: Treating RN: [birth year] (71 y.o. F) Primary Care Isabellamarie Randa: Deland Pretty Other Clinician: Referring Jameika Kinn: Treating Nikia Levels/Extender: Judie Grieve in Treatment: 34 Active Problems Location of Pain Severity and Description of Pain Patient Has Paino No Site Locations [name], [name] ([id number]) [id number] Page 5 of 7 Pain Management and Medication Current Pain Management: Electronic Signature(s) Signed: 07/22/2022 1:02:55 PM By: Erenest Blank Entered By: Erenest Blank on 07/21/2022 11:47:20 -------------------------------------------------------------------------------- Patient/Caregiver Education Details Patient Name: Date of Service: [name] 11/30/2023andnbsp11:15 A M Medical Record Number: [id number] Patient Account Number: [id number] Date of Birth/Gender: Treating RN: 11-12-1950 (71 y.o. Benjaman Lobe Primary Care Physician: Deland Pretty Other Clinician: Referring Physician: Treating Physician/Extender: Judie Grieve in Treatment: 24 Education Assessment Education Provided To: Patient Education Topics Provided Wound/Skin Impairment: Methods: Explain/Verbal  Responses: Reinforcements needed, State content correctly Electronic Signature(s) Signed: 07/21/2022 5:26:06 PM By: Rhae Hammock RN Entered By: Rhae Hammock on 07/21/2022 12:10:00 -------------------------------------------------------------------------------- Wound Assessment Details Patient Name: Date of Service: Christine Debar [name] 07/21/2022 11:15 A  M Medical Record Number: [id number] Patient Account Number: [id number] Date of Birth/Sex: Treating RN: 19-Apr-1951 (71 y.o. F) Primary Care Krystyn Picking: Deland Pretty Other Clinician: Referring [name]: Treating Bralin Garry/Extender: Darnesha, Diloreto, Mart Piggs ([id number]) [id number] Page 6 of 7 Weeks in Treatment: 34 Wound Status Wound Number: 1 Primary Etiology: Trauma, Other Wound Location: Left, Lateral Lower Leg Wound Status: Open Wounding Event: Trauma Comorbid History: Congestive Heart Failure, Hypertension Date Acquired: 10/18/2021 Weeks Of Treatment: 34 Clustered Wound: Yes Photos Wound Measurements Length: (cm) Width: (cm) Depth: (cm) Clustered Quantity: Area: (cm) Volume: (cm) 0.2 % Reduction in Area: 100% 0.3 % Reduction in Volume: 100% 0.5 Epithelialization: Large (67-100%) 1 Tunneling: Yes 0.047 Position (o'clock): 10 0.024 Maximum Distance: (cm) 3.5 Wound Description Classification: Full Thickness With Exposed Support Structures Wound Margin: Distinct, outline attached Exudate Amount: Medium Exudate Type: Serosanguineous Exudate Color: red, brown Foul Odor After Cleansing: No Slough/Fibrino No Wound Bed Granulation Amount: Large (67-100%) Exposed Structure Granulation Quality: Red Fascia Exposed: No Necrotic Amount: None Present (0%) Fat Layer (Subcutaneous Tissue) Exposed: Yes Tendon Exposed: No Muscle Exposed: No Joint Exposed: No Bone Exposed: No Periwound Skin Texture Texture Color No Abnormalities Noted: No No Abnormalities Noted: No Callus: No Atrophie Blanche: No Crepitus: No Cyanosis: No Excoriation: No Ecchymosis: No Induration: Yes Erythema: No Rash: No Hemosiderin Staining: Yes Scarring: No Mottled: No Pallor: No Moisture Rubor: No No Abnormalities Noted: No Dry / Scaly: No Temperature / Pain Maceration: No Temperature: No Abnormality Tenderness on Palpation: Yes Treatment  Notes Wound #1 (Lower Leg) Wound Laterality: Left, Lateral Cleanser Wound Cleanser Discharge Instruction: Cleanse the wound with wound cleanser prior to applying a clean dressing using gauze sponges, not tissue or cotton balls. Peri-Wound Care [name], [name] ([id number]) [id number] Page 7 of 7 Ketoconazole Cream 2% Discharge Instruction: Apply to periwound in clinic. you use clobetasol at home. Topical keystone Discharge Instruction: Soak collagen in the [place] and pack into wound. Collage may be cut into thin strip. Primary Dressing Promogran Prisma Matrix, 4.34 (sq in) (silver collagen) Discharge Instruction: Moisten collagen with saline or hydrogel Compounding topical antibiotics Discharge Instruction: apply antibiotic moisted gauze lightly packed into wound bed. Secondary Dressing ABD Pad, 5x9 Discharge Instruction: Apply over primary dressing as directed. Woven Gauze Sponge, Non-Sterile 4x4 in Discharge Instruction: Apply over primary dressing as directed. Secured With [employer], 4.5x3.1 (in/yd) Discharge Instruction: Secure with Kerlix as directed. 68M Medipore H Soft Cloth Surgical T ape, 4 x 10 (in/yd) Discharge Instruction: Secure with tape as directed. Compression Wrap ace wrap Compression Stockings Add-Ons Electronic Signature(s) Signed: 07/22/2022 1:02:55 PM By: Erenest Blank Entered By: Erenest Blank on 07/21/2022 11:52:29 -------------------------------------------------------------------------------- Vitals Details Patient Name: Date of Service: [name], Christine [name] 07/21/2022 11:15 A M Medical Record Number: [id number] Patient Account Number: [id number] Date of Birth/Sex: Treating RN: [birth year] (71 y.o. F) Primary Care Jalesha Plotz: Deland Pretty Other Clinician: Referring Agnes Probert: Treating Damean Poffenberger/Extender: Judie Grieve in Treatment: 34 Vital Signs Time Taken: 11:47 Temperature (F):  97.7 Height (in): 67 Pulse (bpm): 45 Weight (lbs): 340 Respiratory Rate (breaths/min): 18 Body Mass Index (BMI): 53.2 Blood Pressure (mmHg): 164/59 Reference Range: 80 - 120 mg / dl Electronic Signature(s) Signed: 07/22/2022 1:02:55 PM By: Erenest Blank Entered By: Barbarann Ehlers,  Joelene Millin on 07/21/2022 11:47:12

## 2022-07-26 ENCOUNTER — Encounter (HOSPITAL_BASED_OUTPATIENT_CLINIC_OR_DEPARTMENT_OTHER): Payer: PPO | Attending: Internal Medicine | Admitting: Internal Medicine

## 2022-07-26 DIAGNOSIS — E039 Hypothyroidism, unspecified: Secondary | ICD-10-CM | POA: Diagnosis not present

## 2022-07-26 DIAGNOSIS — M1710 Unilateral primary osteoarthritis, unspecified knee: Secondary | ICD-10-CM | POA: Insufficient documentation

## 2022-07-26 DIAGNOSIS — L97822 Non-pressure chronic ulcer of other part of left lower leg with fat layer exposed: Secondary | ICD-10-CM | POA: Insufficient documentation

## 2022-07-26 DIAGNOSIS — K219 Gastro-esophageal reflux disease without esophagitis: Secondary | ICD-10-CM | POA: Insufficient documentation

## 2022-07-26 DIAGNOSIS — F419 Anxiety disorder, unspecified: Secondary | ICD-10-CM | POA: Diagnosis not present

## 2022-07-26 DIAGNOSIS — N189 Chronic kidney disease, unspecified: Secondary | ICD-10-CM | POA: Insufficient documentation

## 2022-07-26 DIAGNOSIS — I130 Hypertensive heart and chronic kidney disease with heart failure and stage 1 through stage 4 chronic kidney disease, or unspecified chronic kidney disease: Secondary | ICD-10-CM | POA: Diagnosis not present

## 2022-07-26 DIAGNOSIS — I480 Paroxysmal atrial fibrillation: Secondary | ICD-10-CM | POA: Insufficient documentation

## 2022-07-26 DIAGNOSIS — I87312 Chronic venous hypertension (idiopathic) with ulcer of left lower extremity: Secondary | ICD-10-CM | POA: Diagnosis not present

## 2022-07-26 DIAGNOSIS — W19XXXA Unspecified fall, initial encounter: Secondary | ICD-10-CM | POA: Diagnosis not present

## 2022-07-26 DIAGNOSIS — Z7901 Long term (current) use of anticoagulants: Secondary | ICD-10-CM | POA: Insufficient documentation

## 2022-07-26 DIAGNOSIS — T798XXA Other early complications of trauma, initial encounter: Secondary | ICD-10-CM | POA: Insufficient documentation

## 2022-07-26 DIAGNOSIS — I890 Lymphedema, not elsewhere classified: Secondary | ICD-10-CM | POA: Diagnosis not present

## 2022-07-26 DIAGNOSIS — I5032 Chronic diastolic (congestive) heart failure: Secondary | ICD-10-CM | POA: Diagnosis not present

## 2022-07-26 NOTE — Progress Notes (Signed)
[name] ([id number]) [id number] Page 1 of 10 Visit Report for 07/26/2022 Chief Complaint Document Details Patient Name: Date of Service: [name] 07/26/2022 11:00 A M Medical Record Number: [id number] Patient Account Number: [id number] Date of Birth/Sex: Treating RN: 1950/10/24 (71 y.o. F) Primary Care Provider: [name] Other Clinician: Referring Provider: Treating Provider/Extender: Annamary Rummage in Treatment: 35 Information Obtained from: Patient Chief Complaint 11/19/2021; Left lower extremity wound status post fall Electronic Signature(s) Signed: 07/26/2022 11:32:14 AM By: Geralyn Corwin DO Entered By: Geralyn Corwin on 07/26/2022 11:27:58 -------------------------------------------------------------------------------- HPI Details Patient Name: Date of Service: Christine Rolling [name] 07/26/2022 11:00 A M Medical Record Number: [id number] Patient Account Number: [id number] Date of Birth/Sex: Treating RN: Sep 29, 1950 (71 y.o. F) Primary Care Provider: [name] Other Clinician: Referring Provider: Treating Provider/Extender: Annamary Rummage in Treatment: 35 History of Present Illness HPI Description: Admission 11/19/2021 [name] is a 71 year old female with a past medical history of paroxysmal A-fib on Eliquis, hypothyroidism, major depressive disorder, venous insufficiency and chronic diastolic heart failure that presents to the clinic for a 1 month history of nonhealing wound to the left lower extremity. She visited the ED on 10/18/2021 after a mechanical fall. She developed a hematoma that subsequently opened. She was hospitalized for 7 days and discharged on 10/25/2021. She has been on several different antibiotics For the past month. She states that most recently she was on Levaquin and linezolid for the past week. She states she completes her antibiotic course  tomorrow. She has been using Dakin's wet-to-dry dressings to the wound bed. She denies signs of infection. 4/7; patient presents for follow-up. She has been using Dakin's wet-to-dry dressings. She did end up going to the ED on 4/1 because she had excess bleeding with dressing change that she could not stop. She is on Eliquis for A-fib. In the ED they tied off a small artery. She has had no issues since discharge. She denies signs of infection. 4/14; this is a very difficult clinical situation. A patient with underlying chronic venous insufficiency and lymphedema very significant lower extremity edema had a hematoma after a fall on her left upper lateral lower leg. She is on Eliquis for atrial fibrillation apparently with a history of a splenic infarct following with Dr. Berton Mount of cardiology. She has exhibited significant bleeding from the wound surface including ao Venous bleeder that required suturing short while ago. She has been using Dakin's wet-to-dry packing and over the surface of the wound. She saw Dr. Graciela Husbands yesterday he is reluctant to consider stopping the Eliquis because of the prior history of presumed cardioembolism. Wants to communicate with Dr. Mikey Bussing when she returns. In a perfect world where she was not on Eliquis she requires a wound VAC with additional compression wraps but I understand the reluctance to do this because of the concerns of bleeding 4/28; the patient's wound actually looks better today using Dakin's wet-to-dry that she is changing twice a day she is wrapping this with Kerlix and Ace wrapping. She tells me she had 2 small bleeding areas which were part of the superficial wound that stopped this week with direct pressure. Other than that no major issues. In follow-up from discussion of last week Dr. 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  The bigger question from my point of view is the wound VAC issue. As far as she knows and her daughter-in-law verifies that she has not had any bleeding from the deeper parts of the wound although the bleeding has been superficial including the one that sent her to the ER for stitches. She is concerned that a wound VAC would cause further bleeding and I cannot completely allay those concerns. [name] ([id number]) [id number] Page 2 of 10 5/8; patient presents for follow-up. She has no issues or complaints today. She has been using Dakin's wet-to-dry dressings without issues. She denies signs of infection. 5/12; patient presents for follow-up. She continues to use Dakin's wet-to-dry dressings without issues. She denies signs of infection. She reports some issues with bleeding at times but this has improved. She denies signs of infection. 6/1; patient presents for follow-up. She was recently hospitalized for upper left leg thigh cellulitis. She was given IV cefepime and vancomycin and discharged on oral antibiotics. She has been using Dakin's wet-to-dry dressings to the left lower leg wound. She reports improvement in healing. She currently denies systemic signs of infection. 6/7; patient is using Dakin's wet-to-dry twice daily. In general this looks better than when I saw this a month or so ago however still considerable depth to the tunnel in her left leg. She is going for iron infusions ordered by her primary care doctor I believe 6/15; patient presents for follow-up. She has been using Dakin's wet-to-dry dressings. She has noted some scattered small areas that blister up and heal on her left lower leg. She has been using mupirocin ointment on them. Nothing open today. 6/23; patient's been using Dakin's wet-to-dry dressings to the tunneled wound and Hydrofera Blue to the opening. She has no issues or complaints  today. 6/29; patient presents for follow-up. She has been using Dakin's wet-to-dry dressings to the tunneled wound and Hydrofera Blue to the opening. She states that [facility] is sticking to the wound bed. She denies signs of infection. 7/7; patient presents for follow-up. She has been using Dakin's wet-to-dry dressings to the tunneled wound and PolyMem silver to the opening without issues. 7/13; patient presents for follow-up. She has been using PolyMem silver to the opening and the rope to the tunnel. She has no issues or complaints today. She denies signs of infection. 7/20; patient presents for follow-up. We have been using PolyMem silver to the wound bed. She has no issues or complaints today. She is receiving her custom compression garments tomorrow. 7/27; patient presents for follow-up. She has been using PolyMem silver to the wound bed. She is having her custom compression garments adjusted as these are not staying on very easily. 8/30; patient presents for follow-up. We have been using PolyMem silver to the wound bed. She is still waiting on her custom compression garments to be ordered. She denies signs of infection. 8/11; patient presents for follow-up. The wound VAC has been started and patient has been using this for the past week. Patient has home health who changes the wound VAC. She developed irritation to the periwound. DuoDERM was not being used to the periwound despite being ordered. 8/15; patient presents for follow-up. She restarted the wound VAC and has had improvement to the periwound with the use of DuoDERM. 8/24; patient presents for follow-up. She has been using the wound VAC. She has developed a wound just superior to the original wound. Likely as a result from the wound VAC. She denies signs of infection. 8/29;  patient presents for follow-up. We have been using collagen to the wound bed. She has held off on the wound VAC for the past week. 9/7; patient presents for  follow-up. We have been using iodoform packing to the wound bed. She has mild tenderness to the periwound. She denies systemic signs of infection. 9/12; patient presents for follow-up. Patient has been using Dakin's wet-to-dry dressings. She has no issues or complaints today. She denies signs of infection. 9/21; patient presents for follow-up. PuraPly #1 was placed in standard fashion at last clinic visit. She has no issues or complaints today. She denies signs of infection. 10/3; patient presents for follow-up. PuraPly #2 was placed in standard fashion at last clinic visit. She has no issues or complaints today. She denies signs of infection. 10/10; patient presents for follow-up. PuraPly #3 was placed in standard fashion at last clinic visit. She has no issues or complaints today. She denies signs of infection. 10/17; patient presents for follow-up. She has been using silver alginate to the tunneled wound. She has no issues or complaints today. 10/31; patient presents for follow-up. We have been doing Dakin's wet-to-dry dressings to the tunneled wound. The wound VAC has been ordered however patient has not received this. She currently denies signs of infection. She reports a lot of serosanguineous drainage. 11/14; patient presents for follow-up. She had a PCR culture done at last clinic visit that grew Pseudomonas aeruginosa. She obtained Keystone antibiotic spray yesterday. She also obtained her ultrasound of the left lower extremity to assess for abscess however the vascular lab ordered On erroneous test. This has been rectified. Patient was not charged for the test. DVT study showed no DVT T also reported to our nurse that they did not notice any abscess s. Christine Butter while doing the DVT rule out. 11/21; patient presents for follow-up. She has been using Keystone antibiotic with gauze packing. She has no issues or complaints today. 11/30; patient presents for follow-up. She has been using Keystone  antibiotic with gauze packing. Depth is slightly larger today. She denies signs of infection. 12/5; patient presents for follow-up. She has been using Keystone antibiotic spray with collagen. She has no issues or complaints today. She has been using an Ace wrap for compression therapy. Electronic Signature(s) Signed: 07/26/2022 11:32:14 AM By: Geralyn Corwin DO Entered By: Geralyn Corwin on 07/26/2022 11:28:49 [name] ([id number]) [id number] Page 3 of 10 -------------------------------------------------------------------------------- Physical Exam Details Patient Name: Date of Service: [name] 07/26/2022 11:00 A M Medical Record Number: [id number] Patient Account Number: [id number] Date of Birth/Sex: Treating RN: December 10, 1950 (71 y.o. F) Primary Care Provider: [name] Other Clinician: Referring Provider: Treating Provider/Extender: Annamary Rummage in Treatment: 35 Constitutional respirations regular, non-labored and within target range for patient.. Cardiovascular 2+ dorsalis pedis/posterior tibialis pulses. Psychiatric pleasant and cooperative. Notes Left lower extremity: Open wound with granulation tissue at the opening with a depth of 3.5 cm no increased warmth, erythema or purulent drainage. No tenderness on palpation. Uncontrolled lymphedema above the knee. Electronic Signature(s) Signed: 07/26/2022 11:32:14 AM By: Geralyn Corwin DO Entered By: Geralyn Corwin on 07/26/2022 11:29:35 -------------------------------------------------------------------------------- Physician Orders Details Patient Name: Date of Service: Christine Rolling [name] 07/26/2022 11:00 A M Medical Record Number: [id number] Patient Account Number: [id number] Date of Birth/Sex: Treating RN: [birth year] (71 y.o. [name] Primary Care Provider: [name] Other Clinician: Referring Provider: Treating Provider/Extender:  Annamary Rummage in Treatment: [phone] Verbal / Phone Orders: No Diagnosis  Coding ICD-10 Coding Code Description [phone] Non-pressure chronic ulcer of other part of left lower leg with fat layer exposed T79.8XXA Other early complications of trauma, initial encounter I87.312 Chronic venous hypertension (idiopathic) with ulcer of left lower extremity I89.0 Lymphedema, not elsewhere classified I48.0 Paroxysmal atrial fibrillation Z79.01 Long term (current) use of anticoagulants Follow-up Appointments ppointment in 1 week. - Dr. Mikey Bussing Return A ppointment in 2 weeks. - Dr. Mikey Bussing Return A Other: - Continue Topical antibiotic ointment. use clobetasol antifungal at home around the wound. Anesthetic (In clinic) Topical Lidocaine 5% applied to wound bed Cellular or Tissue Based Products Wound #1 Left,Lateral Lower Leg Cellular or Tissue Based Product Type: - 05/12/2022 Puraply AM #1 applied. 05/17/22 PURAPLY AM # 2 05/24/2022 Puraply AM #3 05/31/2022 HOLD Bathing/ Shower/ Hygiene May shower with protection but do not get wound dressing(s) wet. [name], [name] ([id number]) [id number] Page 4 of 10 Edema Control - Lymphedema / SCD / Other Elevate legs to the level of the heart or above for 30 minutes daily and/or when sitting, a frequency of: - 3-4 times a day throughout the day. Avoid standing for long periods of time. Moisturize legs daily. - both legs every night before bed. Home Health No change in wound care orders this week; continue Home Health for wound care. May utilize formulary equivalent dressing for wound treatment orders unless otherwise specified. Other Home Health Orders/Instructions: - [facility] Wound Treatment Wound #1 - Lower Leg Wound Laterality: Left, Lateral Cleanser: Wound Cleanser (Home Health) 1 x Per Day/30 Days Discharge Instructions: Cleanse the wound with wound cleanser prior to applying a clean dressing  using gauze sponges, not tissue or cotton balls. Peri-Wound Care: Ketoconazole Cream 2% 1 x Per Day/30 Days Discharge Instructions: Apply to periwound in clinic. you use clobetasol at home. Topical: keystone 1 x Per Day/30 Days Discharge Instructions: Soak collagen in the keystone and pack into wound. Collage may be cut into thin strip. Prim Dressing: Promogran Prisma Matrix, 4.34 (sq in) (silver collagen) (Generic) 1 x Per Day/30 Days ary Discharge Instructions: Moisten collagen with saline or hydrogel Prim Dressing: Compounding topical antibiotics 1 x Per Day/30 Days ary Discharge Instructions: apply antibiotic moisted gauze lightly packed into wound bed. Secondary Dressing: ABD Pad, 5x9 1 x Per Day/30 Days Discharge Instructions: Apply over primary dressing as directed. Secondary Dressing: Woven Gauze Sponge, Non-Sterile 4x4 in (Home Health) 1 x Per Day/30 Days Discharge Instructions: Apply over primary dressing as directed. Secured With: [employer], 4.5x3.1 (in/yd) (Home Health) 1 x Per Day/30 Days Discharge Instructions: Secure with Kerlix as directed. Secured With: 107M Medipore H Soft Cloth Surgical T ape, 4 x 10 (in/yd) (Home Health) 1 x Per Day/30 Days Discharge Instructions: Secure with tape as directed. Compression Wrap: ace wrap 1 x Per Day/30 Days Electronic Signature(s) Signed: 07/26/2022 11:32:14 AM By: Geralyn Corwin DO Entered By: Geralyn Corwin on 07/26/2022 11:29:42 -------------------------------------------------------------------------------- Problem List Details Patient Name: Date of Service: Christine Rolling [name] 07/26/2022 11:00 A M Medical Record Number: [id number] Patient Account Number: [id number] Date of Birth/Sex: Treating RN: 1950-12-12 (71 y.o. [name] Primary Care Provider: [name] Other Clinician: Referring Provider: Treating Provider/Extender: Annamary Rummage in Treatment: 90 Active  Problems ICD-10 Encounter Code Description Active Date MDM Diagnosis [phone] Non-pressure chronic ulcer of other part of left lower leg with fat layer exposed3/31/2023 No Yes T79.8XXA Other early complications of trauma, initial encounter 11/19/2021 No Yes [name], [name] ([id number]) [id number] Page 5 of  10 I87.312 Chronic venous hypertension (idiopathic) with ulcer of left lower extremity 04/28/2022 No Yes I89.0 Lymphedema, not elsewhere classified 04/28/2022 No Yes I48.0 Paroxysmal atrial fibrillation 11/19/2021 No Yes Z79.01 Long term (current) use of anticoagulants 11/19/2021 No Yes Inactive Problems Resolved Problems Electronic Signature(s) Signed: 07/26/2022 11:32:14 AM By: Geralyn Corwin DO Entered By: Geralyn Corwin on 07/26/2022 11:27:07 -------------------------------------------------------------------------------- Progress Note Details Patient Name: Date of Service: Christine Rolling [name] 07/26/2022 11:00 A M Medical Record Number: [id number] Patient Account Number: [id number] Date of Birth/Sex: Treating RN: 1951/02/01 (71 y.o. F) Primary Care Provider: [name] Other Clinician: Referring Provider: Treating Provider/Extender: Annamary Rummage in Treatment: 35 Subjective Chief Complaint Information obtained from Patient 11/19/2021; Left lower extremity wound status post fall History of Present Illness (HPI) Admission 11/19/2021 Ms. Cynithia Hakimi is a 71 year old female with a past medical history of paroxysmal A-fib on Eliquis, hypothyroidism, major depressive disorder, venous insufficiency and chronic diastolic heart failure that presents to the clinic for a 1 month history of nonhealing wound to the left lower extremity. She visited the ED on 10/18/2021 after a mechanical fall. She developed a hematoma that subsequently opened. She was hospitalized for 7 days and discharged on 10/25/2021. She has been on several  different antibiotics For the past month. She states that most recently she was on Levaquin and linezolid for the past week. She states she completes her antibiotic course tomorrow. She has been using Dakin's wet-to-dry dressings to the wound bed. She denies signs of infection. 4/7; patient presents for follow-up. She has been using Dakin's wet-to-dry dressings. She did end up going to the ED on 4/1 because she had excess bleeding with dressing change that she could not stop. She is on Eliquis for A-fib. In the ED they tied off a small artery. She has had no issues since discharge. She denies signs of infection. 4/14; this is a very difficult clinical situation. A patient with underlying chronic venous insufficiency and lymphedema very significant lower extremity edema had a hematoma after a fall on her left upper lateral lower leg. She is on Eliquis for atrial fibrillation apparently with a history of a splenic infarct following with Dr. Berton Mount of cardiology. She has exhibited significant bleeding from the wound surface including ao Venous bleeder that required suturing short while ago. She has been using Dakin's wet-to-dry packing and over the surface of the wound. She saw Dr. Graciela Husbands yesterday he is reluctant to consider stopping the Eliquis because of the prior history of presumed cardioembolism. Wants to communicate with Dr. Mikey Bussing when she returns. In a perfect world where she was not on Eliquis she requires a wound VAC with additional compression wraps but I understand the reluctance to do this because of the concerns of bleeding 4/28; the patient's wound actually looks better today using Dakin's wet-to-dry that she is changing twice a day she is wrapping this with Kerlix and Ace wrapping. She tells me she had 2 small bleeding areas which were part of the superficial wound that stopped this week with direct pressure. Other than that no major issues. In follow-up from discussion of last week  Dr. 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 The bigger question from my point of view is the wound VAC issue. As far as she knows and her daughter-in-law verifies  that she has not had any bleeding from the deeper parts of the wound although the bleeding has been superficial including the one that sent her to the ER for stitches. She is concerned that a [name], [name] ([id number]) [id number] Page 6 of 10 wound VAC would cause further bleeding and I cannot completely allay those concerns. 5/8; patient presents for follow-up. She has no issues or complaints today. She has been using Dakin's wet-to-dry dressings without issues. She denies signs of infection. 5/12; patient presents for follow-up. She continues to use Dakin's wet-to-dry dressings without issues. She denies signs of infection. She reports some issues with bleeding at times but this has improved. She denies signs of infection. 6/1; patient presents for follow-up. She was recently hospitalized for upper left leg thigh cellulitis. She was given IV cefepime and vancomycin and discharged on oral antibiotics. She has been using Dakin's wet-to-dry dressings to the left lower leg wound. She reports improvement in healing. She currently denies systemic signs of infection. 6/7; patient is using Dakin's wet-to-dry twice daily. In general this looks better than when I saw this a month or so ago however still considerable depth to the tunnel in her left leg. She is going for iron infusions ordered by her primary care doctor I believe 6/15; patient presents for follow-up. She has been using Dakin's wet-to-dry dressings. She has noted some scattered small areas that blister up and heal on her left lower leg. She has been using mupirocin ointment on them.  Nothing open today. 6/23; patient's been using Dakin's wet-to-dry dressings to the tunneled wound and Hydrofera Blue to the opening. She has no issues or complaints today. 6/29; patient presents for follow-up. She has been using Dakin's wet-to-dry dressings to the tunneled wound and Hydrofera Blue to the opening. She states that [facility] is sticking to the wound bed. She denies signs of infection. 7/7; patient presents for follow-up. She has been using Dakin's wet-to-dry dressings to the tunneled wound and PolyMem silver to the opening without issues. 7/13; patient presents for follow-up. She has been using PolyMem silver to the opening and the rope to the tunnel. She has no issues or complaints today. She denies signs of infection. 7/20; patient presents for follow-up. We have been using PolyMem silver to the wound bed. She has no issues or complaints today. She is receiving her custom compression garments tomorrow. 7/27; patient presents for follow-up. She has been using PolyMem silver to the wound bed. She is having her custom compression garments adjusted as these are not staying on very easily. 8/30; patient presents for follow-up. We have been using PolyMem silver to the wound bed. She is still waiting on her custom compression garments to be ordered. She denies signs of infection. 8/11; patient presents for follow-up. The wound VAC has been started and patient has been using this for the past week. Patient has home health who changes the wound VAC. She developed irritation to the periwound. DuoDERM was not being used to the periwound despite being ordered. 8/15; patient presents for follow-up. She restarted the wound VAC and has had improvement to the periwound with the use of DuoDERM. 8/24; patient presents for follow-up. She has been using the wound VAC. She has developed a wound just superior to the original wound. Likely as a result from the wound VAC. She denies signs of  infection. 8/29; patient presents for follow-up. We have been using collagen to the wound bed. She has held off on the wound VAC  for the past week. 9/7; patient presents for follow-up. We have been using iodoform packing to the wound bed. She has mild tenderness to the periwound. She denies systemic signs of infection. 9/12; patient presents for follow-up. Patient has been using Dakin's wet-to-dry dressings. She has no issues or complaints today. She denies signs of infection. 9/21; patient presents for follow-up. PuraPly #1 was placed in standard fashion at last clinic visit. She has no issues or complaints today. She denies signs of infection. 10/3; patient presents for follow-up. PuraPly #2 was placed in standard fashion at last clinic visit. She has no issues or complaints today. She denies signs of infection. 10/10; patient presents for follow-up. PuraPly #3 was placed in standard fashion at last clinic visit. She has no issues or complaints today. She denies signs of infection. 10/17; patient presents for follow-up. She has been using silver alginate to the tunneled wound. She has no issues or complaints today. 10/31; patient presents for follow-up. We have been doing Dakin's wet-to-dry dressings to the tunneled wound. The wound VAC has been ordered however patient has not received this. She currently denies signs of infection. She reports a lot of serosanguineous drainage. 11/14; patient presents for follow-up. She had a PCR culture done at last clinic visit that grew Pseudomonas aeruginosa. She obtained Keystone antibiotic spray yesterday. She also obtained her ultrasound of the left lower extremity to assess for abscess however the vascular lab ordered On erroneous test. This has been rectified. Patient was not charged for the test. DVT study showed no DVT T also reported to our nurse that they did not notice any abscess s. [name] while doing the DVT rule out. 11/21; patient presents for  follow-up. She has been using Keystone antibiotic with gauze packing. She has no issues or complaints today. 11/30; patient presents for follow-up. She has been using Keystone antibiotic with gauze packing. Depth is slightly larger today. She denies signs of infection. 12/5; patient presents for follow-up. She has been using Keystone antibiotic spray with collagen. She has no issues or complaints today. She has been using an Ace wrap for compression therapy. Patient History Medical History Cardiovascular Patient has history of Congestive Heart Failure, Hypertension Hospitalization/Surgery History - Cellulitis left leg- 01/03/2022-01/07/2022. Medical A Surgical History Notes nd Constitutional Symptoms (General Health) Infarction of spleen Hematologic/Lymphatic Hypothyroidism Cardiovascular Christine SleetORRIS, Nyjai J ([id number]) [id number] Page 7 of 10 A-Fib Gastrointestinal Gastroesophageal reflux Genitourinary Chronic kidney disease Musculoskeletal Osteoarthritis of knee Psychiatric Anxiety Objective Constitutional respirations regular, non-labored and within target range for patient.. Vitals Time Taken: 11:04 AM, Height: 67 in, Weight: 340 lbs, BMI: 53.2, Temperature: 97.9 F, Pulse: 57 bpm, Respiratory Rate: 17 breaths/min, Blood Pressure: 146/84 mmHg. Cardiovascular 2+ dorsalis pedis/posterior tibialis pulses. Psychiatric pleasant and cooperative. General Notes: Left lower extremity: Open wound with granulation tissue at the opening with a depth of 3.5 cm no increased warmth, erythema or purulent drainage. No tenderness on palpation. Uncontrolled lymphedema above the knee. Integumentary (Hair, Skin) Wound #1 status is Open. Original cause of wound was Trauma. The date acquired was: 10/18/2021. The wound has been in treatment 35 weeks. The wound is located on the Left,Lateral Lower Leg. The wound measures 0.2cm length x 0.3cm width x 0.5cm depth; 0.047cm^2  area and 0.024cm^3 volume. There is Fat Layer (Subcutaneous Tissue) exposed. There is no undermining noted, however, there is tunneling at 11:00 with a maximum distance of 3.3cm. There is a medium amount of serosanguineous drainage noted. The wound margin is distinct with the outline  attached to the wound base. There is large (67-100%) red granulation within the wound bed. There is no necrotic tissue within the wound bed. The periwound skin appearance exhibited: Induration, Hemosiderin Staining. The periwound skin appearance did not exhibit: Callus, Crepitus, Excoriation, Rash, Scarring, Dry/Scaly, Maceration, Atrophie Blanche, Cyanosis, Ecchymosis, Mottled, Pallor, Rubor, Erythema. Periwound temperature was noted as No Abnormality. The periwound has tenderness on palpation. Assessment Active Problems ICD-10 Non-pressure chronic ulcer of other part of left lower leg with fat layer exposed Other early complications of trauma, initial encounter Chronic venous hypertension (idiopathic) with ulcer of left lower extremity Lymphedema, not elsewhere classified Paroxysmal atrial fibrillation Long term (current) use of anticoagulants Patient's wound is stable. The Ace wrap is able to offer some edema control below the knee however the wound is right near the knee. I asked her to try and compress this area to see if the swelling will decrease. We have tried several different compression therapies however the wrap/garments are unable to stay up. I recommended she continue with collagen and Keystone solution. Follow-up in 1 week. Plan Follow-up Appointments: Return Appointment in 1 week. - Dr. Mikey Bussing Return Appointment in 2 weeks. - Dr. Mikey Bussing Other: - Continue Topical antibiotic ointment. use clobetasol antifungal at home around the wound. Anesthetic: (In clinic) Topical Lidocaine 5% applied to wound bed Cellular or Tissue Based Products: Wound #1 Left,Lateral Lower Leg: Cellular or Tissue Based  Product Type: - 05/12/2022 Puraply AM #1 applied. 05/17/22 PURAPLY AM # 2 05/24/2022 Puraply AM #3 05/31/2022 HOLD Bathing/ Shower/ Hygiene: May shower with protection but do not get wound dressing(s) wet. Edema Control - Lymphedema / SCD / Other: Elevate legs to the level of the heart or above for 30 minutes daily and/or when sitting, a frequency of: - 3-4 times a day throughout the day. [name], [name] ([id number]) [id number] Page 8 of 10 Avoid standing for long periods of time. Moisturize legs daily. - both legs every night before bed. Home Health: No change in wound care orders this week; continue Home Health for wound care. May utilize formulary equivalent dressing for wound treatment orders unless otherwise specified. Other Home Health Orders/Instructions: - [facility] WOUND #1: - Lower Leg Wound Laterality: Left, Lateral Cleanser: Wound Cleanser (Home Health) 1 x Per Day/30 Days Discharge Instructions: Cleanse the wound with wound cleanser prior to applying a clean dressing using gauze sponges, not tissue or cotton balls. Peri-Wound Care: Ketoconazole Cream 2% 1 x Per Day/30 Days Discharge Instructions: Apply to periwound in clinic. you use clobetasol at home. Topical: keystone 1 x Per Day/30 Days Discharge Instructions: Soak collagen in the keystone and pack into wound. Collage may be cut into thin strip. Prim Dressing: Promogran Prisma Matrix, 4.34 (sq in) (silver collagen) (Generic) 1 x Per Day/30 Days ary Discharge Instructions: Moisten collagen with saline or hydrogel Prim Dressing: Compounding topical antibiotics 1 x Per Day/30 Days ary Discharge Instructions: apply antibiotic moisted gauze lightly packed into wound bed. Secondary Dressing: ABD Pad, 5x9 1 x Per Day/30 Days Discharge Instructions: Apply over primary dressing as directed. Secondary Dressing: Woven Gauze Sponge, Non-Sterile 4x4 in (Home Health) 1 x Per Day/30 Days Discharge  Instructions: Apply over primary dressing as directed. Secured With: [employer], 4.5x3.1 (in/yd) (Home Health) 1 x Per Day/30 Days Discharge Instructions: Secure with Kerlix as directed. Secured With: 75M Medipore H Soft Cloth Surgical T ape, 4 x 10 (in/yd) (Home Health) 1 x Per Day/30 Days Discharge Instructions: Secure with tape as directed. Com pression  Wrap: ace wrap 1 x Per Day/30 Days 1. Keystone antibiotic solution with collagen 2. Ace wrap 3. Follow-up in 1 week Electronic Signature(s) Signed: 07/26/2022 11:32:14 AM By: Geralyn Corwin DO Entered By: Geralyn Corwin on 07/26/2022 11:31:31 -------------------------------------------------------------------------------- HxROS Details Patient Name: Date of Service: [name], Christine [name] 07/26/2022 11:00 A M Medical Record Number: [id number] Patient Account Number: [id number] Date of Birth/Sex: Treating RN: [birth year] (71 y.o. F) Primary Care Provider: [name] Other Clinician: Referring Provider: Treating Provider/Extender: Annamary Rummage in Treatment: 35 Constitutional Symptoms (General Health) Medical History: Past Medical History Notes: Infarction of spleen Hematologic/Lymphatic Medical History: Past Medical History Notes: Hypothyroidism Cardiovascular Medical History: Positive for: Congestive Heart Failure; Hypertension Past Medical History Notes: A-Fib Gastrointestinal Medical History: Past Medical History Notes: Gastroesophageal reflux Genitourinary [name], [name] ([id number]) [id number] Page 9 of 10 Medical History: Past Medical History Notes: Chronic kidney disease Musculoskeletal Medical History: Past Medical History Notes: Osteoarthritis of knee Psychiatric Medical History: Past Medical History Notes: Anxiety Immunizations Pneumococcal Vaccine: Received Pneumococcal Vaccination: No Implantable Devices No devices  added Hospitalization / Surgery History Type of Hospitalization/Surgery Cellulitis left leg- 01/03/2022-01/07/2022 Electronic Signature(s) Signed: 07/26/2022 11:32:14 AM By: Geralyn Corwin DO Entered By: Geralyn Corwin on 07/26/2022 11:28:55 -------------------------------------------------------------------------------- SuperBill Details Patient Name: Date of Service: [name]. 07/26/2022 Medical Record Number: [id number] Patient Account Number: [id number] Date of Birth/Sex: Treating RN: 1950-10-31 (71 y.o. [name] Primary Care Provider: [name] Other Clinician: Referring Provider: Treating Provider/Extender: Annamary Rummage in Treatment: 35 Diagnosis Coding ICD-10 Codes Code Description [phone] Non-pressure chronic ulcer of other part of left lower leg with fat layer exposed T79.8XXA Other early complications of trauma, initial encounter I87.312 Chronic venous hypertension (idiopathic) with ulcer of left lower extremity I89.0 Lymphedema, not elsewhere classified I48.0 Paroxysmal atrial fibrillation Z79.01 Long term (current) use of anticoagulants Facility Procedures : CPT4 Code: [id number] Description: 99213 - WOUND CARE VISIT-LEV 3 EST PT Modifier: Quantity: 1 Physician Procedures : CPT4 Code Description Modifier [id number] 99213 - WC PHYS LEVEL 3 - EST PT ICD-10 Diagnosis Description L97.822 Non-pressure chronic ulcer of other part of left lower leg with fat layer exposed T79.8XXA Other early complications of trauma, initial  encounter I87.312 Chronic venous hypertension (idiopathic) with ulcer of left lower extremity [name], [name] ([id number]) [id number] P I89.0 Lymphedema, not elsewhere classified Quantity: 1 [age] Electronic Signature(s) Signed: 07/26/2022 11:32:14 AM By: Geralyn Corwin DO Entered By: Geralyn Corwin on 07/26/2022 11:31:45

## 2022-08-02 ENCOUNTER — Encounter (HOSPITAL_BASED_OUTPATIENT_CLINIC_OR_DEPARTMENT_OTHER): Payer: PPO | Admitting: Internal Medicine

## 2022-08-02 DIAGNOSIS — T798XXA Other early complications of trauma, initial encounter: Secondary | ICD-10-CM

## 2022-08-02 DIAGNOSIS — I890 Lymphedema, not elsewhere classified: Secondary | ICD-10-CM

## 2022-08-02 DIAGNOSIS — L97822 Non-pressure chronic ulcer of other part of left lower leg with fat layer exposed: Secondary | ICD-10-CM

## 2022-08-02 DIAGNOSIS — I87312 Chronic venous hypertension (idiopathic) with ulcer of left lower extremity: Secondary | ICD-10-CM | POA: Diagnosis not present

## 2022-08-02 NOTE — Progress Notes (Signed)
[name] ([id number]) [id number] Page 1 of 10 Visit Report for 08/02/2022 Chief Complaint Document Details Patient Name: Date of Service: [name] 08/02/2022 10:00 A M Medical Record Number: [id number] Patient Account Number: [id number] Date of Birth/Sex: Treating RN: 03/13/1951 (71 y.o. F) Primary Care Provider: [name] Other Clinician: Referring Provider: Treating Provider/Extender: Annamary Rummage in Treatment: 78 Information Obtained from: Patient Chief Complaint 11/19/2021; Left lower extremity wound status post fall Electronic Signature(s) Signed: 08/02/2022 12:07:10 PM By: Geralyn Corwin DO Entered By: Geralyn Corwin on 08/02/2022 11:27:56 -------------------------------------------------------------------------------- HPI Details Patient Name: Date of Service: [name]. 08/02/2022 10:00 A M Medical Record Number: [id number] Patient Account Number: [id number] Date of Birth/Sex: Treating RN: [birth year] (71 y.o. F) Primary Care Provider: [name] Other Clinician: Referring Provider: Treating Provider/Extender: Annamary Rummage in Treatment: 36 History of Present Illness HPI Description: Admission 11/19/2021 [name] is a 71 year old female with a past medical history of paroxysmal A-fib on Eliquis, hypothyroidism, major depressive disorder, venous insufficiency and chronic diastolic heart failure that presents to the clinic for a 1 month history of nonhealing wound to the left lower extremity. She visited the ED on 10/18/2021 after a mechanical fall. She developed a hematoma that subsequently opened. She was hospitalized for 7 days and discharged on 10/25/2021. She has been on several different antibiotics For the past month. She states that most recently she was on Levaquin and linezolid for the past week. She states she completes her antibiotic course  tomorrow. She has been using Dakin's wet-to-dry dressings to the wound bed. She denies signs of infection. 4/7; patient presents for follow-up. She has been using Dakin's wet-to-dry dressings. She did end up going to the ED on 4/1 because she had excess bleeding with dressing change that she could not stop. She is on Eliquis for A-fib. In the ED they tied off a small artery. She has had no issues since discharge. She denies signs of infection. 4/14; this is a very difficult clinical situation. A patient with underlying chronic venous insufficiency and lymphedema very significant lower extremity edema had a hematoma after a fall on her left upper lateral lower leg. She is on Eliquis for atrial fibrillation apparently with a history of a splenic infarct following with Dr. Berton Mount of cardiology. She has exhibited significant bleeding from the wound surface including ao Venous bleeder that required suturing short while ago. She has been using Dakin's wet-to-dry packing and over the surface of the wound. She saw Dr. Graciela Husbands yesterday he is reluctant to consider stopping the Eliquis because of the prior history of presumed cardioembolism. Wants to communicate with Dr. Mikey Bussing when she returns. In a perfect world where she was not on Eliquis she requires a wound VAC with additional compression wraps but I understand the reluctance to do this because of the concerns of bleeding 4/28; the patient's wound actually looks better today using Dakin's wet-to-dry that she is changing twice a day she is wrapping this with Kerlix and Ace wrapping. She tells me she had 2 small bleeding areas which were part of the superficial wound that stopped this week with direct pressure. Other than that no major issues. In follow-up from discussion of last week Dr. 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  The bigger question from my point of view is the wound VAC issue. As far as she knows and her daughter-in-law verifies that she has not had any bleeding from the deeper parts of the wound although the bleeding has been superficial including the one that sent her to the ER for stitches. She is concerned that a wound VAC would cause further bleeding and I cannot completely allay those concerns. [name], [name] ([id number]) [id number] Page 2 of 10 5/8; patient presents for follow-up. She has no issues or complaints today. She has been using Dakin's wet-to-dry dressings without issues. She denies signs of infection. 5/12; patient presents for follow-up. She continues to use Dakin's wet-to-dry dressings without issues. She denies signs of infection. She reports some issues with bleeding at times but this has improved. She denies signs of infection. 6/1; patient presents for follow-up. She was recently hospitalized for upper left leg thigh cellulitis. She was given IV cefepime and vancomycin and discharged on oral antibiotics. She has been using Dakin's wet-to-dry dressings to the left lower leg wound. She reports improvement in healing. She currently denies systemic signs of infection. 6/7; patient is using Dakin's wet-to-dry twice daily. In general this looks better than when I saw this a month or so ago however still considerable depth to the tunnel in her left leg. She is going for iron infusions ordered by her primary care doctor I believe 6/15; patient presents for follow-up. She has been using Dakin's wet-to-dry dressings. She has noted some scattered small areas that blister up and heal on her left lower leg. She has been using mupirocin ointment on them. Nothing open today. 6/23; patient's been using Dakin's wet-to-dry dressings to the tunneled wound and Hydrofera Blue to the opening. She has no issues or complaints  today. 6/29; patient presents for follow-up. She has been using Dakin's wet-to-dry dressings to the tunneled wound and Hydrofera Blue to the opening. She states that [facility] is sticking to the wound bed. She denies signs of infection. 7/7; patient presents for follow-up. She has been using Dakin's wet-to-dry dressings to the tunneled wound and PolyMem silver to the opening without issues. 7/13; patient presents for follow-up. She has been using PolyMem silver to the opening and the rope to the tunnel. She has no issues or complaints today. She denies signs of infection. 7/20; patient presents for follow-up. We have been using PolyMem silver to the wound bed. She has no issues or complaints today. She is receiving her custom compression garments tomorrow. 7/27; patient presents for follow-up. She has been using PolyMem silver to the wound bed. She is having her custom compression garments adjusted as these are not staying on very easily. 8/30; patient presents for follow-up. We have been using PolyMem silver to the wound bed. She is still waiting on her custom compression garments to be ordered. She denies signs of infection. 8/11; patient presents for follow-up. The wound VAC has been started and patient has been using this for the past week. Patient has home health who changes the wound VAC. She developed irritation to the periwound. DuoDERM was not being used to the periwound despite being ordered. 8/15; patient presents for follow-up. She restarted the wound VAC and has had improvement to the periwound with the use of DuoDERM. 8/24; patient presents for follow-up. She has been using the wound VAC. She has developed a wound just superior to the original wound. Likely as a result from the wound VAC. She denies signs of infection. 8/29;  patient presents for follow-up. We have been using collagen to the wound bed. She has held off on the wound VAC for the past week. 9/7; patient presents for  follow-up. We have been using iodoform packing to the wound bed. She has mild tenderness to the periwound. She denies systemic signs of infection. 9/12; patient presents for follow-up. Patient has been using Dakin's wet-to-dry dressings. She has no issues or complaints today. She denies signs of infection. 9/21; patient presents for follow-up. PuraPly #1 was placed in standard fashion at last clinic visit. She has no issues or complaints today. She denies signs of infection. 10/3; patient presents for follow-up. PuraPly #2 was placed in standard fashion at last clinic visit. She has no issues or complaints today. She denies signs of infection. 10/10; patient presents for follow-up. PuraPly #3 was placed in standard fashion at last clinic visit. She has no issues or complaints today. She denies signs of infection. 10/17; patient presents for follow-up. She has been using silver alginate to the tunneled wound. She has no issues or complaints today. 10/31; patient presents for follow-up. We have been doing Dakin's wet-to-dry dressings to the tunneled wound. The wound VAC has been ordered however patient has not received this. She currently denies signs of infection. She reports a lot of serosanguineous drainage. 11/14; patient presents for follow-up. She had a PCR culture done at last clinic visit that grew Pseudomonas aeruginosa. She obtained Keystone antibiotic spray yesterday. She also obtained her ultrasound of the left lower extremity to assess for abscess however the vascular lab ordered On erroneous test. This has been rectified. Patient was not charged for the test. DVT study showed no DVT T also reported to our nurse that they did not notice any abscess s. Christen Butter while doing the DVT rule out. 11/21; patient presents for follow-up. She has been using Keystone antibiotic with gauze packing. She has no issues or complaints today. 11/30; patient presents for follow-up. She has been using Keystone  antibiotic with gauze packing. Depth is slightly larger today. She denies signs of infection. 12/5; patient presents for follow-up. She has been using Keystone antibiotic spray with collagen. She has no issues or complaints today. She has been using an Ace wrap for compression therapy. 12/12; patient presents for follow-up. She has been using Keystone antibiotic spray with collagen and Ace wrap for compression. She states that the Ace wrap falls down during the day. She is only able to use this at night. Electronic Signature(s) Signed: 08/02/2022 12:07:10 PM By: Geralyn Corwin DO Entered By: Geralyn Corwin on 08/02/2022 11:29:07 Christine Sleet ([id number]) [id number] Page 3 of 10 -------------------------------------------------------------------------------- Physical Exam Details Patient Name: Date of Service: [name] 08/02/2022 10:00 A M Medical Record Number: [id number] Patient Account Number: [id number] Date of Birth/Sex: Treating RN: 1950-11-14 (71 y.o. F) Primary Care Provider: [name] Other Clinician: Referring Provider: Treating Provider/Extender: Annamary Rummage in Treatment: 36 Constitutional respirations regular, non-labored and within target range for patient.. Cardiovascular 2+ dorsalis pedis/posterior tibialis pulses. Psychiatric pleasant and cooperative. Notes Left lower extremity: Open wound with granulation tissue at the opening with a depth of 2.5 cm no increased warmth, erythema or purulent drainage. No tenderness on palpation. Uncontrolled lymphedema above the knee. Electronic Signature(s) Signed: 08/02/2022 12:07:10 PM By: Geralyn Corwin DO Entered By: Geralyn Corwin on 08/02/2022 11:31:11 -------------------------------------------------------------------------------- Physician Orders Details Patient Name: Date of Service: [name]. 08/02/2022 10:00 A M Medical  Record Number: [id number] Patient  Account Number: [id number] Date of Birth/Sex: Treating RN: 18-Oct-1950 (71 y.o. [name] Primary Care Provider: [name] Other Clinician: Referring Provider: Treating Provider/Extender: Annamary Rummage in Treatment: 32 Verbal / Phone Orders: No Diagnosis Coding ICD-10 Coding Code Description [phone] Non-pressure chronic ulcer of other part of left lower leg with fat layer exposed T79.8XXA Other early complications of trauma, initial encounter I87.312 Chronic venous hypertension (idiopathic) with ulcer of left lower extremity I89.0 Lymphedema, not elsewhere classified I48.0 Paroxysmal atrial fibrillation Z79.01 Long term (current) use of anticoagulants Follow-up Appointments ppointment in 1 week. - Dr. Mikey Bussing Return A ppointment in 2 weeks. - Dr. Mikey Bussing Return A Other: - Continue Topical antibiotic ointment. use clobetasol antifungal at home around the wound. Anesthetic (In clinic) Topical Lidocaine 5% applied to wound bed Cellular or Tissue Based Products Wound #1 Left,Lateral Lower Leg Cellular or Tissue Based Product Type: - 05/12/2022 Puraply AM #1 applied. [name], [name] ([id number]) [id number] Page 4 of 10 05/17/22 PURAPLY AM # 2 05/24/2022 Puraply AM #3 05/31/2022 HOLD Bathing/ Shower/ Hygiene May shower with protection but do not get wound dressing(s) wet. Edema Control - Lymphedema / SCD / Other Elevate legs to the level of the heart or above for 30 minutes daily and/or when sitting, a frequency of: - 3-4 times a day throughout the day. Avoid standing for long periods of time. Moisturize legs daily. - both legs every night before bed. Wound Treatment Wound #1 - Lower Leg Wound Laterality: Left, Lateral Cleanser: Wound Cleanser (Home Health) 1 x Per Day/30 Days Discharge Instructions: Cleanse the wound with wound cleanser prior to applying a clean dressing using gauze  sponges, not tissue or cotton balls. Peri-Wound Care: Ketoconazole Cream 2% 1 x Per Day/30 Days Discharge Instructions: Apply to periwound in clinic. you use clobetasol at home. Topical: keystone 1 x Per Day/30 Days Discharge Instructions: Soak collagen in the keystone and pack into wound. Collage may be cut into thin strip. Prim Dressing: Promogran Prisma Matrix, 4.34 (sq in) (silver collagen) (Generic) 1 x Per Day/30 Days ary Discharge Instructions: Moisten collagen with saline or hydrogel Prim Dressing: Plain packing strip 1/4 (in) 1 x Per Day/30 Days ary Discharge Instructions: Lightly pack a small amount behind the collagen. leave a tail out of the wound to remove. Prim Dressing: Compounding topical antibiotics 1 x Per Day/30 Days ary Discharge Instructions: apply antibiotic moisted gauze lightly packed into wound bed. Secondary Dressing: ABD Pad, 5x9 1 x Per Day/30 Days Discharge Instructions: Apply over primary dressing as directed. Secondary Dressing: Woven Gauze Sponge, Non-Sterile 4x4 in (Home Health) 1 x Per Day/30 Days Discharge Instructions: Apply over primary dressing as directed. Secured With: [employer], 4.5x3.1 (in/yd) (Home Health) 1 x Per Day/30 Days Discharge Instructions: Secure with Kerlix as directed. Secured With: 3M Medipore H Soft Cloth Surgical T ape, 4 x 10 (in/yd) (Home Health) 1 x Per Day/30 Days Discharge Instructions: Secure with tape as directed. Compression Wrap: ace wrap (Home Health) 1 x Per Day/30 Days Discharge Instructions: use if the tubigrip size E does not work. Compression Wrap: tubigrip size E (Home Health) 1 x Per Day/30 Days Discharge Instructions: pull up over the knee. Electronic Signature(s) Signed: 08/02/2022 12:07:10 PM By: Geralyn Corwin DO Entered By: Geralyn Corwin on 08/02/2022 11:31:19 -------------------------------------------------------------------------------- Problem List Details Patient Name: Date of  Service: [name]. 08/02/2022 10:00 A M Medical Record Number: [id number] Patient Account Number: [id number] Date of Birth/Sex: Treating RN: August 05, 1951 (71 y.o.  [name] Primary Care Provider: [name] Other Clinician: Referring Provider: Treating Provider/Extender: Annamary Rummage in Treatment: [address] Problems ICD-10 SUMAIYAH, MARKERT ([id number]) [id number] Page 5 of 10 Encounter Code Description Active Date MDM Diagnosis [id number] Non-pressure chronic ulcer of other part of left lower leg with fat layer exposed3/31/2023 No Yes T79.8XXA Other early complications of trauma, initial encounter 11/19/2021 No Yes I87.312 Chronic venous hypertension (idiopathic) with ulcer of left lower extremity 04/28/2022 No Yes I89.0 Lymphedema, not elsewhere classified 04/28/2022 No Yes I48.0 Paroxysmal atrial fibrillation 11/19/2021 No Yes Z79.01 Long term (current) use of anticoagulants 11/19/2021 No Yes Inactive Problems Resolved Problems Electronic Signature(s) Signed: 08/02/2022 12:07:10 PM By: Geralyn Corwin DO Entered By: Geralyn Corwin on 08/02/2022 11:27:42 -------------------------------------------------------------------------------- Progress Note Details Patient Name: Date of Service: [name]. 08/02/2022 10:00 A M Medical Record Number: [id number] Patient Account Number: [id number] Date of Birth/Sex: Treating RN: [birth year] (71 y.o. F) Primary Care Provider: [name] Other Clinician: Referring Provider: Treating Provider/Extender: Annamary Rummage in Treatment: 36 Subjective Chief Complaint Information obtained from Patient 11/19/2021; Left lower extremity wound status post fall History of Present Illness (HPI) Admission 11/19/2021 Ms. Symphani Eckstrom is a 71 year old female with a past medical history of paroxysmal A-fib on Eliquis, hypothyroidism, major depressive  disorder, venous insufficiency and chronic diastolic heart failure that presents to the clinic for a 1 month history of nonhealing wound to the left lower extremity. She visited the ED on 10/18/2021 after a mechanical fall. She developed a hematoma that subsequently opened. She was hospitalized for 7 days and discharged on 10/25/2021. She has been on several different antibiotics For the past month. She states that most recently she was on Levaquin and linezolid for the past week. She states she completes her antibiotic course tomorrow. She has been using Dakin's wet-to-dry dressings to the wound bed. She denies signs of infection. 4/7; patient presents for follow-up. She has been using Dakin's wet-to-dry dressings. She did end up going to the ED on 4/1 because she had excess bleeding with dressing change that she could not stop. She is on Eliquis for A-fib. In the ED they tied off a small artery. She has had no issues since discharge. She denies signs of infection. 4/14; this is a very difficult clinical situation. A patient with underlying chronic venous insufficiency and lymphedema very significant lower extremity edema had a hematoma after a fall on her left upper lateral lower leg. She is on Eliquis for atrial fibrillation apparently with a history of a splenic infarct following with Dr. Berton Mount of cardiology. She has exhibited significant bleeding from the wound surface including ao Venous bleeder that required suturing short while ago. She has been using Dakin's wet-to-dry packing and over the surface of the wound. She saw Dr. Graciela Husbands yesterday he is reluctant to consider stopping the Eliquis because of the prior history of presumed cardioembolism. Wants to communicate with Dr. Mikey Bussing when she returns. In a perfect world where she was not on Eliquis she requires a wound VAC with additional compression wraps but I understand the reluctance to do this because of the concerns of bleeding [name], [name] ([id number]) [id number] Page 6 of 10 4/28; the patient's wound actually looks better today using Dakin's wet-to-dry that she is changing twice a day she is wrapping this with Kerlix and Ace wrapping. She tells me she had 2 small bleeding areas which were part of the superficial wound that stopped this  week with direct pressure. Other than that no major issues. In follow-up from discussion of last week Dr. 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 The bigger question from my point of view is the wound VAC issue. As far as she knows and her daughter-in-law verifies that she has not had any bleeding from the deeper parts of the wound although the bleeding has been superficial including the one that sent her to the ER for stitches. She is concerned that a wound VAC would cause further bleeding and I cannot completely allay those concerns. 5/8; patient presents for follow-up. She has no issues or complaints today. She has been using Dakin's wet-to-dry dressings without issues. She denies signs of infection. 5/12; patient presents for follow-up. She continues to use Dakin's wet-to-dry dressings without issues. She denies signs of infection. She reports some issues with bleeding at times but this has improved. She denies signs of infection. 6/1; patient presents for follow-up. She was recently hospitalized for upper left leg thigh cellulitis. She was given IV cefepime and vancomycin and discharged on oral antibiotics. She has been using Dakin's wet-to-dry dressings to the left lower leg wound. She reports improvement in healing. She currently denies systemic signs of infection. 6/7; patient is using Dakin's wet-to-dry twice daily. In general this looks better than when I saw this a month or so ago  however still considerable depth to the tunnel in her left leg. She is going for iron infusions ordered by her primary care doctor I believe 6/15; patient presents for follow-up. She has been using Dakin's wet-to-dry dressings. She has noted some scattered small areas that blister up and heal on her left lower leg. She has been using mupirocin ointment on them. Nothing open today. 6/23; patient's been using Dakin's wet-to-dry dressings to the tunneled wound and Hydrofera Blue to the opening. She has no issues or complaints today. 6/29; patient presents for follow-up. She has been using Dakin's wet-to-dry dressings to the tunneled wound and Hydrofera Blue to the opening. She states that [facility] is sticking to the wound bed. She denies signs of infection. 7/7; patient presents for follow-up. She has been using Dakin's wet-to-dry dressings to the tunneled wound and PolyMem silver to the opening without issues. 7/13; patient presents for follow-up. She has been using PolyMem silver to the opening and the rope to the tunnel. She has no issues or complaints today. She denies signs of infection. 7/20; patient presents for follow-up. We have been using PolyMem silver to the wound bed. She has no issues or complaints today. She is receiving her custom compression garments tomorrow. 7/27; patient presents for follow-up. She has been using PolyMem silver to the wound bed. She is having her custom compression garments adjusted as these are not staying on very easily. 8/30; patient presents for follow-up. We have been using PolyMem silver to the wound bed. She is still waiting on her custom compression garments to be ordered. She denies signs of infection. 8/11; patient presents for follow-up. The wound VAC has been started and patient has been using this for the past week. Patient has home health who changes the wound VAC. She developed irritation to the periwound. DuoDERM was not being used to the  periwound despite being ordered. 8/15; patient presents for follow-up. She restarted the wound VAC and has  had improvement to the periwound with the use of DuoDERM. 8/24; patient presents for follow-up. She has been using the wound VAC. She has developed a wound just superior to the original wound. Likely as a result from the wound VAC. She denies signs of infection. 8/29; patient presents for follow-up. We have been using collagen to the wound bed. She has held off on the wound VAC for the past week. 9/7; patient presents for follow-up. We have been using iodoform packing to the wound bed. She has mild tenderness to the periwound. She denies systemic signs of infection. 9/12; patient presents for follow-up. Patient has been using Dakin's wet-to-dry dressings. She has no issues or complaints today. She denies signs of infection. 9/21; patient presents for follow-up. PuraPly #1 was placed in standard fashion at last clinic visit. She has no issues or complaints today. She denies signs of infection. 10/3; patient presents for follow-up. PuraPly #2 was placed in standard fashion at last clinic visit. She has no issues or complaints today. She denies signs of infection. 10/10; patient presents for follow-up. PuraPly #3 was placed in standard fashion at last clinic visit. She has no issues or complaints today. She denies signs of infection. 10/17; patient presents for follow-up. She has been using silver alginate to the tunneled wound. She has no issues or complaints today. 10/31; patient presents for follow-up. We have been doing Dakin's wet-to-dry dressings to the tunneled wound. The wound VAC has been ordered however patient has not received this. She currently denies signs of infection. She reports a lot of serosanguineous drainage. 11/14; patient presents for follow-up. She had a PCR culture done at last clinic visit that grew Pseudomonas aeruginosa. She obtained Keystone antibiotic spray  yesterday. She also obtained her ultrasound of the left lower extremity to assess for abscess however the vascular lab ordered On erroneous test. This has been rectified. Patient was not charged for the test. DVT study showed no DVT T also reported to our nurse that they did not notice any abscess s. Christen Butter while doing the DVT rule out. 11/21; patient presents for follow-up. She has been using Keystone antibiotic with gauze packing. She has no issues or complaints today. 11/30; patient presents for follow-up. She has been using Keystone antibiotic with gauze packing. Depth is slightly larger today. She denies signs of infection. 12/5; patient presents for follow-up. She has been using Keystone antibiotic spray with collagen. She has no issues or complaints today. She has been using an Ace wrap for compression therapy. 12/12; patient presents for follow-up. She has been using Keystone antibiotic spray with collagen and Ace wrap for compression. She states that the Ace wrap falls down during the day. She is only able to use this at night. [name], [name] ([id number]) [id number] Page 7 of 10 Patient History Medical History Cardiovascular Patient has history of Congestive Heart Failure, Hypertension Hospitalization/Surgery History - Cellulitis left leg- 01/03/2022-01/07/2022. Medical A Surgical History Notes nd Constitutional Symptoms (General Health) Infarction of spleen Hematologic/Lymphatic Hypothyroidism Cardiovascular A-Fib Gastrointestinal Gastroesophageal reflux Genitourinary Chronic kidney disease Musculoskeletal Osteoarthritis of knee Psychiatric Anxiety Objective Constitutional respirations regular, non-labored and within target range for patient.. Vitals Time Taken: 10:06 AM, Height: 67 in, Weight: 340 lbs, BMI: 53.2, Temperature: 97.8 F, Pulse: 47 bpm, Respiratory Rate: 18 breaths/min, Blood Pressure: 157/60 mmHg. Cardiovascular 2+ dorsalis  pedis/posterior tibialis pulses. Psychiatric pleasant and cooperative. General Notes: Left lower extremity: Open wound with granulation tissue at the opening with a depth of 2.5 cm no increased  warmth, erythema or purulent drainage. No tenderness on palpation. Uncontrolled lymphedema above the knee. Integumentary (Hair, Skin) Wound #1 status is Open. Original cause of wound was Trauma. The date acquired was: 10/18/2021. The wound has been in treatment 36 weeks. The wound is located on the Left,Lateral Lower Leg. The wound measures 0.2cm length x 0.2cm width x 0.5cm depth; 0.031cm^2 area and 0.016cm^3 volume. There is Fat Layer (Subcutaneous Tissue) exposed. There is no undermining noted, however, there is tunneling at 10:00 with a maximum distance of 2.5cm. There is a medium amount of serosanguineous drainage noted. The wound margin is distinct with the outline attached to the wound base. There is large (67-100%) red granulation within the wound bed. There is no necrotic tissue within the wound bed. The periwound skin appearance exhibited: Induration, Hemosiderin Staining. The periwound skin appearance did not exhibit: Callus, Crepitus, Excoriation, Rash, Scarring, Dry/Scaly, Maceration, Atrophie Blanche, Cyanosis, Ecchymosis, Mottled, Pallor, Rubor, Erythema. Periwound temperature was noted as No Abnormality. The periwound has tenderness on palpation. Assessment Active Problems ICD-10 Non-pressure chronic ulcer of other part of left lower leg with fat layer exposed Other early complications of trauma, initial encounter Chronic venous hypertension (idiopathic) with ulcer of left lower extremity Lymphedema, not elsewhere classified Paroxysmal atrial fibrillation Long term (current) use of anticoagulants Patient's wound has improved in depth. Her main barrier is swelling. We have tried in office wraps, custom compression garments, Tubigrip however these all fall down. The only thing that seems to  work for her are ace wraps But these are not even staying on in the daytime. She has been using them at night. For now I recommended continuing with collagen and antibiotic spray under Ace wrap. I recommended she elevate her legs when sitting. Follow-up in 1 week. Plan Follow-up Appointments: [name], [name] ([id number]) [phone].pdf Page 8 of 10 Return Appointment in 1 week. - Dr. Mikey Bussing Return Appointment in 2 weeks. - Dr. Mikey Bussing Other: - Continue Topical antibiotic ointment. use clobetasol antifungal at home around the wound. Anesthetic: (In clinic) Topical Lidocaine 5% applied to wound bed Cellular or Tissue Based Products: Wound #1 Left,Lateral Lower Leg: Cellular or Tissue Based Product Type: - 05/12/2022 Puraply AM #1 applied. 05/17/22 PURAPLY AM # 2 05/24/2022 Puraply AM #3 05/31/2022 HOLD Bathing/ Shower/ Hygiene: May shower with protection but do not get wound dressing(s) wet. Edema Control - Lymphedema / SCD / Other: Elevate legs to the level of the heart or above for 30 minutes daily and/or when sitting, a frequency of: - 3-4 times a day throughout the day. Avoid standing for long periods of time. Moisturize legs daily. - both legs every night before bed. WOUND #1: - Lower Leg Wound Laterality: Left, Lateral Cleanser: Wound Cleanser (Home Health) 1 x Per Day/30 Days Discharge Instructions: Cleanse the wound with wound cleanser prior to applying a clean dressing using gauze sponges, not tissue or cotton balls. Peri-Wound Care: Ketoconazole Cream 2% 1 x Per Day/30 Days Discharge Instructions: Apply to periwound in clinic. you use clobetasol at home. Topical: keystone 1 x Per Day/30 Days Discharge Instructions: Soak collagen in the keystone and pack into wound. Collage may be cut into thin strip. Prim Dressing: Promogran Prisma Matrix, 4.34 (sq in) (silver collagen) (Generic) 1 x Per Day/30 Days ary Discharge Instructions: Moisten collagen with  saline or hydrogel Prim Dressing: Plain packing strip 1/4 (in) 1 x Per Day/30 Days ary Discharge Instructions: Lightly pack a small amount behind the collagen. leave a tail out of the wound to  remove. Prim Dressing: Compounding topical antibiotics 1 x Per Day/30 Days ary Discharge Instructions: apply antibiotic moisted gauze lightly packed into wound bed. Secondary Dressing: ABD Pad, 5x9 1 x Per Day/30 Days Discharge Instructions: Apply over primary dressing as directed. Secondary Dressing: Woven Gauze Sponge, Non-Sterile 4x4 in (Home Health) 1 x Per Day/30 Days Discharge Instructions: Apply over primary dressing as directed. Secured With: [employer], 4.5x3.1 (in/yd) (Home Health) 1 x Per Day/30 Days Discharge Instructions: Secure with Kerlix as directed. Secured With: 32M Medipore H Soft Cloth Surgical T ape, 4 x 10 (in/yd) (Home Health) 1 x Per Day/30 Days Discharge Instructions: Secure with tape as directed. Com pression Wrap: ace wrap (Home Health) 1 x Per Day/30 Days Discharge Instructions: use if the tubigrip size E does not work. Com pression Wrap: tubigrip size E (Home Health) 1 x Per Day/30 Days Discharge Instructions: pull up over the knee. 1. Collagen with antibiotic spray 2. Ace wraps 3. Follow-up in 1 week Electronic Signature(s) Signed: 08/02/2022 12:07:10 PM By: Geralyn Corwin DO Entered By: Geralyn Corwin on 08/02/2022 11:36:36 -------------------------------------------------------------------------------- HxROS Details Patient Name: Date of Service: [name], Christine LYN [name]. 08/02/2022 10:00 A M Medical Record Number: [id number] Patient Account Number: [id number] Date of Birth/Sex: Treating RN: 31-Mar-1951 (71 y.o. F) Primary Care Provider: [name] Other Clinician: Referring Provider: Treating Provider/Extender: Annamary Rummage in Treatment: 36 Constitutional Symptoms (General Health) Medical History: Past Medical History  Notes: Infarction of spleen Hematologic/Lymphatic Medical History: Past Medical History Notes: Hypothyroidism Cardiovascular Medical HistoryMarland Kitchen AZAYLAH, STAILEY ([id number]) [id number] Page 9 of 10 Positive for: Congestive Heart Failure; Hypertension Past Medical History Notes: A-Fib Gastrointestinal Medical History: Past Medical History Notes: Gastroesophageal reflux Genitourinary Medical History: Past Medical History Notes: Chronic kidney disease Musculoskeletal Medical History: Past Medical History Notes: Osteoarthritis of knee Psychiatric Medical History: Past Medical History Notes: Anxiety Immunizations Pneumococcal Vaccine: Received Pneumococcal Vaccination: No Implantable Devices No devices added Hospitalization / Surgery History Type of Hospitalization/Surgery Cellulitis left leg- 01/03/2022-01/07/2022 Electronic Signature(s) Signed: 08/02/2022 12:07:10 PM By: Geralyn Corwin DO Entered By: Geralyn Corwin on 08/02/2022 11:29:14 -------------------------------------------------------------------------------- SuperBill Details Patient Name: Date of Service: [name]. 08/02/2022 Medical Record Number: [id number] Patient Account Number: [id number] Date of Birth/Sex: Treating RN: Dec 29, 1950 (71 y.o. [name] Primary Care Provider: [name] Other Clinician: Referring Provider: Treating Provider/Extender: Annamary Rummage in Treatment: 36 Diagnosis Coding ICD-10 Codes Code Description [phone] Non-pressure chronic ulcer of other part of left lower leg with fat layer exposed T79.8XXA Other early complications of trauma, initial encounter I87.312 Chronic venous hypertension (idiopathic) with ulcer of left lower extremity I89.0 Lymphedema, not elsewhere classified I48.0 Paroxysmal atrial fibrillation Z79.01 Long term (current) use of anticoagulants Facility Procedures ESSENCE, MERLE  ([id number]): CPT4 Code Description [phone] - WOUND CARE VISIT-LEV 3 EST PT [id number] Page 10 of 10: Modifier Quantity 1 Physician Procedures : CPT4 Code Description Modifier [id number] - WC PHYS LEVEL 3 - EST PT ICD-10 Diagnosis Description L97.822 Non-pressure chronic ulcer of other part of left lower leg with fat layer exposed T79.8XXA Other early complications of trauma, initial  encounter I87.312 Chronic venous hypertension (idiopathic) with ulcer of left lower extremity I89.0 Lymphedema, not elsewhere classified Quantity: 1 Electronic Signature(s) Signed: 08/02/2022 12:07:10 PM By: Geralyn Corwin DO Entered By: Geralyn Corwin on 08/02/2022 11:36:51

## 2022-08-03 NOTE — Progress Notes (Signed)
[name], [name] ([id number]) [id number] Page 1 of 10 Visit Report for [name] Arrival Information Details Patient Name: Date of Service: [name] [name] 10:00 A M Medical Record Number: [id number] Patient Account Number: [id number] Date of Birth/Sex: Treating RN: 23-Jun-1951 (71 y.o. F) Primary Care [name]: [name] Other Clinician: Referring [name]: Treating [name]/Extender: [name] in Treatment: 21 Visit Information History Since Last Visit Added or deleted any medications: Christine Patient Arrived: Walker Any new allergies or adverse reactions: Christine Arrival Time: 10:03 Had a fall or experienced change in Christine Accompanied By: self activities of daily living that may affect Transfer Assistance: None risk of falls: Patient Identification Verified: Yes Signs or symptoms of abuse/neglect since last visito Christine Secondary Verification Process Completed: Yes Hospitalized since last visit: Christine Patient Requires Transmission-Based Precautions: Christine Implantable device outside of the clinic excluding Christine Patient Has Alerts: Yes cellular tissue based products placed in the center Patient Alerts: Patient on Blood Thinner since last visit: Has Dressing in Place as Prescribed: Yes Pain Present Now: Christine Electronic Signature(s) Signed: 08/03/2022 12:00:46 PM By: [name] Entered By: [name] 10:03:39 -------------------------------------------------------------------------------- Clinic Level of Care Assessment Details Patient Name: Date of Service: [name] [name] 10:00 A M Medical Record Number: [id number] Patient Account Number: [id number] Date of Birth/Sex: Treating RN: [birth year] (71 y.o. [name] Primary Care [name]: [name] Other Clinician: Referring [name]: Treating [name]/Extender: [name] in Treatment: 36 Clinic Level  of Care Assessment Items TOOL 4 Quantity Score X- 1 0 Use when only an EandM is performed on FOLLOW-UP visit ASSESSMENTS - Nursing Assessment / Reassessment X- 1 10 Reassessment of Co-morbidities (includes updates in patient status) X- 1 5 Reassessment of Adherence to Treatment Plan ASSESSMENTS - Wound and Skin A ssessment / Reassessment X - Simple Wound Assessment / Reassessment - one wound 1 5 _0  - 0 Complex Wound Assessment / Reassessment - multiple wounds X- 1 10 Dermatologic / Skin Assessment (not related to wound area) ASSESSMENTS - Focused Assessment _1  - 0 Circumferential Edema Measurements - multi extremities _2  - 0 Nutritional Assessment / Counseling / Intervention [name] ([id number]) [id number] Page 2 of 10 _3  - 0 Lower Extremity Assessment (monofilament, tuning fork, pulses) _4  - 0 Peripheral Arterial Disease Assessment (using hand held doppler) ASSESSMENTS - Ostomy and/or Continence Assessment and Care _5  - 0 Incontinence Assessment and Management _6  - 0 Ostomy Care Assessment and Management (repouching, etc.) PROCESS - Coordination of Care X - Simple Patient / Family Education for ongoing care 1 15 _7  - 0 Complex (extensive) Patient / Family Education for ongoing care X- 1 10 Staff obtains Programmer, systems, Records, T Results / Process Orders est X- 1 10 Staff telephones HHA, Nursing Homes / Clarify orders / etc _8  - 0 Routine Transfer to another Facility (non-emergent condition) _9  - 0 Routine Hospital Admission (non-emergent condition) _10  - 0 New Admissions / Biomedical engineer / Ordering NPWT Apligraf, etc. , _11  - 0 Emergency Hospital Admission (emergent condition) X- 1 10 Simple Discharge Coordination _12  - 0 Complex (extensive) Discharge Coordination PROCESS - Special Needs _13  - 0 Pediatric / Minor Patient Management _14  - 0 Isolation Patient Management _15  - 0 Hearing / Language / Visual special needs _16   - 0 Assessment of Community assistance (transportation, D/C planning, etc.) _17  - 0 Additional assistance / Altered mentation _18  - 0 Support Surface(s) Assessment (bed, cushion, seat, etc.) INTERVENTIONS - Wound Cleansing /  Measurement X - Simple Wound Cleansing - one wound 1 5 _0  - 0 Complex Wound Cleansing - multiple wounds X- 1 5 Wound Imaging (photographs - any number of wounds) _1  - 0 Wound Tracing (instead of photographs) X- 1 5 Simple Wound Measurement - one wound _2  - 0 Complex Wound Measurement - multiple wounds INTERVENTIONS - Wound Dressings _3  - 0 Small Wound Dressing one or multiple wounds X- 1 15 Medium Wound Dressing one or multiple wounds _4  - 0 Large Wound Dressing one or multiple wounds <GYKZLDJTTSVXBLTJ>_0<\/ZESPQZRAQTMAUQJF>_3  - 0 Application of Medications - topical <LKTGYBWLSLHTDSKA>_7<\/GOTLXBWIOMBTDHRC>_1  - 0 Application of Medications - injection INTERVENTIONS - Miscellaneous _7  - 0 External ear exam _8  - 0 Specimen Collection (cultures, biopsies, blood, body fluids, etc.) _9  - 0 Specimen(s) / Culture(s) sent or taken to Lab for analysis _10  - 0 Patient Transfer (multiple staff / Civil Service fast streamer / Similar devices) _11  - 0 Simple Staple / Suture removal (25 or less) _12  - 0 Complex Staple / Suture removal (26 or more) _13  - 0 Hypo / Hyperglycemic Management (close monitor of Blood Glucose) [name], [name] ([id number]) [id number] Page 3 of 10 _14  - 0 Ankle / Brachial Index (ABI) - do not check if billed separately X- 1 5 Vital Signs Has the patient been seen at the hospital within the last three years: Yes Total Score: 110 Level Of Care: New/Established - Level 3 Electronic Signature(s) Signed: 08/02/2022 4:43:40 PM By: Deon Pilling RN, BSN Entered By: Deon Pilling on [name] 10:27:15 -------------------------------------------------------------------------------- Encounter Discharge Information Details Patient Name: Date of Service: Christine Debar [name] [name] 10:00 A M Medical  Record Number: [id number] Patient Account Number: [id number] Date of Birth/Sex: Treating RN: 1950-09-27 (71 y.o. [name] Primary Care Ameliarose Shark: [name] Other Clinician: Referring Tabitha Riggins: Treating Lacora Folmer/Extender: [name] in Treatment: 36 Encounter Discharge Information Items Discharge Condition: Stable Ambulatory Status: Walker Discharge Destination: Home Transportation: Private Auto Accompanied By: self Schedule Follow-up Appointment: Yes Clinical Summary of Care: Electronic Signature(s) Signed: 08/02/2022 4:43:40 PM By: Deon Pilling RN, BSN Entered By: Deon Pilling on [name] 10:27:48 -------------------------------------------------------------------------------- Lower Extremity Assessment Details Patient Name: Date of Service: Christine Simmonds J. [name] 10:00 A M Medical Record Number: [id number] Patient Account Number: [id number] Date of Birth/Sex: Treating RN: 03-Sep-1950 (71 y.o. F) Primary Care Lurdes Haltiwanger: [name] Other Clinician: Referring Luiscarlos Kaczmarczyk: Treating Cyruss Arata/Extender: [name] in Treatment: 36 Edema Assessment Assessed: Shirlyn Goltz: Christine] [Right: Christine] Edema: [Left: Ye] [Right: s] Calf Left: Right: Point of Measurement: 31 cm From Medial Instep 46 cm Ankle Left: Right: Point of Measurement: 9 cm From Medial Instep 25.7 cm Electronic Signature(s) Signed: 08/03/2022 12:00:46 PM By: Leonette Most ([id number]) By: Maryanna Shape.pdf Page 4 of 10 Signed: 08/03/2022 12:00:46 PM Entered By: [name] 10:13:04 -------------------------------------------------------------------------------- Multi Wound Chart Details Patient Name: Date of Service: [name] [name] 10:00 A M Medical Record Number: [id number] Patient Account Number: [id number] Date of Birth/Sex: Treating RN: 1951/08/10 (71 y.o.  F) Primary Care Koralynn Greenspan: [name] Other Clinician: Referring Yaniyah Koors: Treating Alaysiah Browder/Extender: [name] in Treatment: 36 Vital Signs Height(in): 67 Pulse(bpm): 23 Weight(lbs): 340 Blood Pressure(mmHg): 157/60 Body Mass Index(BMI): 53.2 Temperature(F): 97.8 Respiratory Rate(breaths/min): 18 [1:Photos:] [N/A:N/A] Left, Lateral Lower Leg N/A N/A Wound Location: Trauma N/A N/A Wounding Event: Trauma, Other N/A N/A Primary Etiology: Congestive Heart Failure, N/A N/A Comorbid History: Hypertension 10/18/2021 N/A N/A Date Acquired: 21 N/A N/A Weeks  of Treatment: Open N/A N/A Wound Status: Christine N/A N/A Wound Recurrence: Yes N/A N/A Clustered Wound: 1 N/A N/A Clustered Quantity: 0.2x0.2x0.5 N/A N/A Measurements L x W x D (cm) 0.031 N/A N/A A (cm) : rea 0.016 N/A N/A Volume (cm) : 100.00% N/A N/A % Reduction in A rea: 100.00% N/A N/A % Reduction in Volume: 10 Position 1 (o'clock): 2.5 Maximum Distance 1 (cm): Yes N/A N/A Tunneling: Full Thickness With Exposed Support N/A N/A Classification: Structures Medium N/A N/A Exudate Amount: Serosanguineous N/A N/A Exudate Type: red, brown N/A N/A Exudate Color: Distinct, outline attached N/A N/A Wound Margin: Large (67-100%) N/A N/A Granulation Amount: Red N/A N/A Granulation Quality: None Present (0%) N/A N/A Necrotic Amount: Fat Layer (Subcutaneous Tissue): Yes N/A N/A Exposed Structures: Fascia: Christine Tendon: Christine Muscle: Christine Joint: Christine Bone: Christine Large (67-100%) N/A N/A Epithelialization: Induration: Yes N/A N/A Periwound Skin Texture: Excoriation: Christine Callus: Christine Crepitus: Christine Rash: Christine Scarring: Christine Maceration: Christine N/A N/A Periwound Skin Moisture: Dry/Scaly: Christine [name], [name] ([id number]) [id number] Page 5 of 10 Hemosiderin Staining: Yes N/A N/A Periwound Skin Color: Atrophie Blanche: Christine Cyanosis: Christine Ecchymosis: Christine Erythema:  Christine Mottled: Christine Pallor: Christine Rubor: Christine Christine Abnormality N/A N/A Temperature: Yes N/A N/A Tenderness on Palpation: Treatment Notes Wound #1 (Lower Leg) Wound Laterality: Left, Lateral Cleanser Wound Cleanser Discharge Instruction: Cleanse the wound with wound cleanser prior to applying a clean dressing using gauze sponges, not tissue or cotton balls. Peri-Wound Care Ketoconazole Cream 2% Discharge Instruction: Apply to periwound in clinic. you use clobetasol at home. Topical keystone Discharge Instruction: Soak collagen in the [place] and pack into wound. Collage may be cut into thin strip. Primary Dressing Promogran Prisma Matrix, 4.34 (sq in) (silver collagen) Discharge Instruction: Moisten collagen with saline or hydrogel Compounding topical antibiotics Discharge Instruction: apply antibiotic moisted gauze lightly packed into wound bed. Secondary Dressing ABD Pad, 5x9 Discharge Instruction: Apply over primary dressing as directed. Woven Gauze Sponge, Non-Sterile 4x4 in Discharge Instruction: Apply over primary dressing as directed. Secured With [employer], 4.5x3.1 (in/yd) Discharge Instruction: Secure with Kerlix as directed. 40M Medipore H Soft Cloth Surgical T ape, 4 x 10 (in/yd) Discharge Instruction: Secure with tape as directed. Compression Wrap ace wrap Discharge Instruction: use if the tubigrip size E does not work. tubigrip size E Discharge Instruction: pull up over the knee. Compression Stockings Add-Ons Electronic Signature(s) Signed: 08/02/2022 12:07:10 PM By: Kalman Shan DO Entered By: Kalman Shan on [name] 11:27:48 -------------------------------------------------------------------------------- Multi-Disciplinary Care Plan Details Patient Name: Date of Service: Christine Debar [name] [name] 10:00 A M Medical Record Number: [id number] Patient Account Number: [id number] Date of Birth/Sex: Treating RN: Sep 27, 1950 (71 y.o. [name] Primary Care Eluterio Seymour: [name] Other Clinician: Georgiana Spinner ([id number]) [id number] Page 6 of 10 Referring Tyerra Loretto: Treating Lundyn Coste/Extender: [name] in Treatment: 36 Active Inactive Abuse / Safety / Falls / Self Care Management Nursing Diagnoses: History of Falls Potential for falls Goals: Patient/caregiver will verbalize/demonstrate measure taken to improve self care Date Initiated: 11/19/2021 Date Inactivated: 04/05/2022 Target Resolution Date: 04/21/2022 Goal Status: Met Patient/caregiver will verbalize/demonstrate measures taken to prevent injury and/or falls Date Initiated: 11/19/2021 Target Resolution Date: 09/22/2022 Goal Status: Active Interventions: Provide education on basic hygiene Provide education on fall prevention Provide education on HBO safety Provide education on vaccinations Treatment Activities: Education provided on Basic Hygiene : 05/17/2022 Notes: Pain, Acute or Chronic Nursing Diagnoses: Pain, acute or chronic: actual or potential Potential alteration  in comfort, pain Goals: Patient will verbalize adequate pain control and receive pain control interventions during procedures as needed Date Initiated: 11/19/2021 Date Inactivated: 04/05/2022 Target Resolution Date: 04/22/2022 Goal Status: Met Patient/caregiver will verbalize comfort level met Date Initiated: 11/19/2021 Target Resolution Date: 09/15/2022 Goal Status: Active Interventions: Encourage patient to take pain medications as prescribed Provide education on pain management Reposition patient for comfort Treatment Activities: Administer pain control measures as ordered : 11/19/2021 Notes: Electronic Signature(s) Signed: 08/02/2022 4:43:40 PM By: Deon Pilling RN, BSN Entered By: Deon Pilling on [name] 10:23:44 -------------------------------------------------------------------------------- Pain Assessment  Details Patient Name: Date of Service: Christine Debar [name] [name] 10:00 A M Medical Record Number: [id number] Patient Account Number: [id number] Date of Birth/Sex: Treating RN: Feb 07, 1951 (71 y.o. F) Primary Care Earl Losee: [name] Other Clinician: Referring Kharma Sampsel: Treating Kemonie Cutillo/Extender: [name] in Treatment: [address] [place] ([id number]) [id number] Page 7 of 10 Active Problems Location of Pain Severity and Description of Pain Patient Has Paino Christine Site Locations Pain Management and Medication Current Pain Management: Electronic Signature(s) Signed: 08/03/2022 12:00:46 PM By: [name] Entered By: [name] 10:12:47 -------------------------------------------------------------------------------- Patient/Caregiver Education Details Patient Name: Date of Service: [name], Christine [name] 12/12/2023andnbsp10:00 A M Medical Record Number: [id number] Patient Account Number: [id number] Date of Birth/Gender: Treating RN: June 28, 1951 (71 y.o. [name] Primary Care Physician: [name] Other Clinician: Referring Physician: Treating Physician/Extender: [name] in Treatment: 14 Education Assessment Education Provided To: Patient Education Topics Provided Pain: Handouts: A Guide to Pain Control Methods: Explain/Verbal Responses: Reinforcements needed Electronic Signature(s) Signed: 08/02/2022 4:43:40 PM By: Deon Pilling RN, BSN Entered By: Deon Pilling on [name] 10:24:01 Georgiana Spinner ([id number]) [id number] Page 8 of 10 -------------------------------------------------------------------------------- Wound Assessment Details Patient Name: Date of Service: [name] [name] 10:00 A M Medical Record Number: [id number] Patient Account Number: [id number] Date of Birth/Sex: Treating RN: 01/14/1951  (71 y.o. F) Primary Care Rubby Barbary: [name] Other Clinician: Referring Ashtyn Freilich: Treating Crystallynn Noorani/Extender: [name] in Treatment: 36 Wound Status Wound Number: 1 Primary Etiology: Trauma, Other Wound Location: Left, Lateral Lower Leg Wound Status: Open Wounding Event: Trauma Comorbid History: Congestive Heart Failure, Hypertension Date Acquired: 10/18/2021 Weeks Of Treatment: 36 Clustered Wound: Yes Photos Wound Measurements Length: (cm) Width: (cm) Depth: (cm) Clustered Quantity: Area: (cm) Volume: (cm) 0.2 % Reduction in Area: 100% 0.2 % Reduction in Volume: 100% 0.5 Epithelialization: Large (67-100%) 1 Tunneling: Yes 0.031 Position (o'clock): 10 0.016 Maximum Distance: (cm) 2.5 Undermining: Christine Wound Description Classification: Full Thickness With Exposed Support Structures Wound Margin: Distinct, outline attached Exudate Amount: Medium Exudate Type: Serosanguineous Exudate Color: red, brown Foul Odor After Cleansing: Christine Slough/Fibrino Christine Wound Bed Granulation Amount: Large (67-100%) Exposed Structure Granulation Quality: Red Fascia Exposed: Christine Necrotic Amount: None Present (0%) Fat Layer (Subcutaneous Tissue) Exposed: Yes Tendon Exposed: Christine Muscle Exposed: Christine Joint Exposed: Christine Bone Exposed: Christine Periwound Skin Texture Texture Color Christine Abnormalities Noted: Christine Christine Abnormalities Noted: Christine Callus: Christine Atrophie Blanche: Christine Crepitus: Christine Cyanosis: Christine Excoriation: Christine Ecchymosis: Christine Induration: Yes Erythema: Christine Rash: Christine Hemosiderin Staining: Yes Scarring: Christine Mottled: Christine Pallor: Christine Moisture Rubor: Christine Christine Abnormalities Noted: Christine Dry / Scaly: Christine Temperature / Pain Maceration: Christine Temperature: Christine Abnormality Tenderness on Palpation: Yes Treatment Notes [name], [name] ([id number]) [id number] Page 9 of 10 Wound #1 (Lower Leg) Wound Laterality: Left, Lateral Cleanser Wound Cleanser Discharge  Instruction: Cleanse the wound with wound  cleanser prior to applying a clean dressing using gauze sponges, not tissue or cotton balls. Peri-Wound Care Ketoconazole Cream 2% Discharge Instruction: Apply to periwound in clinic. you use clobetasol at home. Topical keystone Discharge Instruction: Soak collagen in the [place] and pack into wound. Collage may be cut into thin strip. Primary Dressing Promogran Prisma Matrix, 4.34 (sq in) (silver collagen) Discharge Instruction: Moisten collagen with saline or hydrogel Compounding topical antibiotics Discharge Instruction: apply antibiotic moisted gauze lightly packed into wound bed. Secondary Dressing ABD Pad, 5x9 Discharge Instruction: Apply over primary dressing as directed. Woven Gauze Sponge, Non-Sterile 4x4 in Discharge Instruction: Apply over primary dressing as directed. Secured With [employer], 4.5x3.1 (in/yd) Discharge Instruction: Secure with Kerlix as directed. 33M Medipore H Soft Cloth Surgical T ape, 4 x 10 (in/yd) Discharge Instruction: Secure with tape as directed. Compression Wrap ace wrap Discharge Instruction: use if the tubigrip size E does not work. tubigrip size E Discharge Instruction: pull up over the knee. Compression Stockings Add-Ons Electronic Signature(s) Signed: 08/03/2022 12:00:46 PM By: [name] Entered By: [name] 10:15:19 -------------------------------------------------------------------------------- Vitals Details Patient Name: Date of Service: [name], Christine [name] [name] 10:00 A M Medical Record Number: [id number] Patient Account Number: [id number] Date of Birth/Sex: Treating RN: 1950/10/03 (72 y.o. F) Primary Care Brighton Pilley: [name] Other Clinician: Referring Dekayla Prestridge: Treating Chenelle Benning/Extender: [name] in Treatment: 36 Vital Signs Time Taken: 10:06 Temperature (F): 97.8 Height (in): 67 Pulse (bpm): 47 Weight  (lbs): 340 Respiratory Rate (breaths/min): 18 Body Mass Index (BMI): 53.2 Blood Pressure (mmHg): 157/60 Reference Range: 80 - 120 mg / dl Electronic Signature(s) [name], [name] ([id number]) [id number] Page 10 of 10 Signed: 08/03/2022 12:00:46 PM By: [name] Entered By: [name] 10:07:27

## 2022-08-05 DIAGNOSIS — Z96653 Presence of artificial knee joint, bilateral: Secondary | ICD-10-CM | POA: Diagnosis not present

## 2022-08-05 DIAGNOSIS — Z9181 History of falling: Secondary | ICD-10-CM | POA: Diagnosis not present

## 2022-08-05 DIAGNOSIS — J309 Allergic rhinitis, unspecified: Secondary | ICD-10-CM | POA: Diagnosis not present

## 2022-08-05 DIAGNOSIS — Z7901 Long term (current) use of anticoagulants: Secondary | ICD-10-CM | POA: Diagnosis not present

## 2022-08-05 DIAGNOSIS — I4891 Unspecified atrial fibrillation: Secondary | ICD-10-CM | POA: Diagnosis not present

## 2022-08-05 DIAGNOSIS — I110 Hypertensive heart disease with heart failure: Secondary | ICD-10-CM | POA: Diagnosis not present

## 2022-08-05 DIAGNOSIS — E039 Hypothyroidism, unspecified: Secondary | ICD-10-CM | POA: Diagnosis not present

## 2022-08-05 DIAGNOSIS — I5032 Chronic diastolic (congestive) heart failure: Secondary | ICD-10-CM | POA: Diagnosis not present

## 2022-08-05 DIAGNOSIS — T24332D Burn of third degree of left lower leg, subsequent encounter: Secondary | ICD-10-CM | POA: Diagnosis not present

## 2022-08-05 DIAGNOSIS — Z6841 Body Mass Index (BMI) 40.0 and over, adult: Secondary | ICD-10-CM | POA: Diagnosis not present

## 2022-08-05 DIAGNOSIS — Z9049 Acquired absence of other specified parts of digestive tract: Secondary | ICD-10-CM | POA: Diagnosis not present

## 2022-08-05 DIAGNOSIS — F325 Major depressive disorder, single episode, in full remission: Secondary | ICD-10-CM | POA: Diagnosis not present

## 2022-08-05 DIAGNOSIS — D500 Iron deficiency anemia secondary to blood loss (chronic): Secondary | ICD-10-CM | POA: Diagnosis not present

## 2022-08-09 ENCOUNTER — Encounter (HOSPITAL_BASED_OUTPATIENT_CLINIC_OR_DEPARTMENT_OTHER): Payer: PPO | Admitting: Internal Medicine

## 2022-08-09 DIAGNOSIS — I890 Lymphedema, not elsewhere classified: Secondary | ICD-10-CM

## 2022-08-09 DIAGNOSIS — L97822 Non-pressure chronic ulcer of other part of left lower leg with fat layer exposed: Secondary | ICD-10-CM | POA: Diagnosis not present

## 2022-08-09 DIAGNOSIS — I87312 Chronic venous hypertension (idiopathic) with ulcer of left lower extremity: Secondary | ICD-10-CM

## 2022-08-09 DIAGNOSIS — T798XXA Other early complications of trauma, initial encounter: Secondary | ICD-10-CM

## 2022-08-09 NOTE — Progress Notes (Signed)
SCHELLY, CHUBA ([id number]) [id number] Page 1 of 10 Visit Report for 08/09/2022 Chief Complaint Document Details Patient Name: Date of Service: [name] 08/09/2022 10:30 A M Medical Record Number: [id number] Patient Account Number: [id number] Date of Birth/Sex: Treating RN: 1950/12/13 (71 y.o. F) Primary Care Provider: [name] Other Clinician: Referring Provider: Treating Provider/Extender: Annamary Rummage in Treatment: 78 Information Obtained from: Patient Chief Complaint 11/19/2021; Left lower extremity wound status post fall Electronic Signature(s) Signed: 08/09/2022 12:56:05 PM By: Geralyn Corwin DO Entered By: Geralyn Corwin on 08/09/2022 10:53:50 -------------------------------------------------------------------------------- HPI Details Patient Name: Date of Service: [name], [name] 08/09/2022 10:30 A M Medical Record Number: [id number] Patient Account Number: [id number] Date of Birth/Sex: Treating RN: 01-Oct-1950 (71 y.o. F) Primary Care Provider: [name] Other Clinician: Referring Provider: Treating Provider/Extender: Annamary Rummage in Treatment: 37 History of Present Illness HPI Description: Admission 11/19/2021 [name] is a 71 year old female with a past medical history of paroxysmal A-fib on Eliquis, hypothyroidism, major depressive disorder, venous insufficiency and chronic diastolic heart failure that presents to the clinic for a 1 month history of nonhealing wound to the left lower extremity. She visited the ED on 10/18/2021 after a mechanical fall. She developed a hematoma that subsequently opened. She was hospitalized for 7 days and discharged on 10/25/2021. She has been on several different antibiotics For the past month. She states that most recently she was on Levaquin and linezolid for the past week. She states she completes her antibiotic course  tomorrow. She has been using Dakin's wet-to-dry dressings to the wound bed. She denies signs of infection. 4/7; patient presents for follow-up. She has been using Dakin's wet-to-dry dressings. She did end up going to the ED on 4/1 because she had excess bleeding with dressing change that she could not stop. She is on Eliquis for A-fib. In the ED they tied off a small artery. She has had no issues since discharge. She denies signs of infection. 4/14; this is a very difficult clinical situation. A patient with underlying chronic venous insufficiency and lymphedema very significant lower extremity edema had a hematoma after a fall on her left upper lateral lower leg. She is on Eliquis for atrial fibrillation apparently with a history of a splenic infarct following with Dr. Berton Mount of cardiology. She has exhibited significant bleeding from the wound surface including ao Venous bleeder that required suturing short while ago. She has been using Dakin's wet-to-dry packing and over the surface of the wound. She saw Dr. Graciela Husbands yesterday he is reluctant to consider stopping the Eliquis because of the prior history of presumed cardioembolism. Wants to communicate with Dr. Mikey Bussing when she returns. In a perfect world where she was not on Eliquis she requires a wound VAC with additional compression wraps but I understand the reluctance to do this because of the concerns of bleeding 4/28; the patient's wound actually looks better today using Dakin's wet-to-dry that she is changing twice a day she is wrapping this with Kerlix and Ace wrapping. She tells me she had 2 small bleeding areas which were part of the superficial wound that stopped this week with direct pressure. Other than that no major issues. In follow-up from discussion of last week Dr. 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  The bigger question from my point of view is the wound VAC issue. As far as she knows and her daughter-in-law verifies that she has not had any bleeding from the deeper parts of the wound although the bleeding has been superficial including the one that sent her to the ER for stitches. She is concerned that a wound VAC would cause further bleeding and I cannot completely allay those concerns. DEMESHA, [name] ([id number]) [id number] Page 2 of 10 5/8; patient presents for follow-up. She has no issues or complaints today. She has been using Dakin's wet-to-dry dressings without issues. She denies signs of infection. 5/12; patient presents for follow-up. She continues to use Dakin's wet-to-dry dressings without issues. She denies signs of infection. She reports some issues with bleeding at times but this has improved. She denies signs of infection. 6/1; patient presents for follow-up. She was recently hospitalized for upper left leg thigh cellulitis. She was given IV cefepime and vancomycin and discharged on oral antibiotics. She has been using Dakin's wet-to-dry dressings to the left lower leg wound. She reports improvement in healing. She currently denies systemic signs of infection. 6/7; patient is using Dakin's wet-to-dry twice daily. In general this looks better than when I saw this a month or so ago however still considerable depth to the tunnel in her left leg. She is going for iron infusions ordered by her primary care doctor I believe 6/15; patient presents for follow-up. She has been using Dakin's wet-to-dry dressings. She has noted some scattered small areas that blister up and heal on her left lower leg. She has been using mupirocin ointment on them. Nothing open today. 6/23; patient's been using Dakin's wet-to-dry dressings to the tunneled wound and Hydrofera Blue to the opening. She has no issues or complaints  today. 6/29; patient presents for follow-up. She has been using Dakin's wet-to-dry dressings to the tunneled wound and Hydrofera Blue to the opening. She states that [facility] is sticking to the wound bed. She denies signs of infection. 7/7; patient presents for follow-up. She has been using Dakin's wet-to-dry dressings to the tunneled wound and PolyMem silver to the opening without issues. 7/13; patient presents for follow-up. She has been using PolyMem silver to the opening and the rope to the tunnel. She has no issues or complaints today. She denies signs of infection. 7/20; patient presents for follow-up. We have been using PolyMem silver to the wound bed. She has no issues or complaints today. She is receiving her custom compression garments tomorrow. 7/27; patient presents for follow-up. She has been using PolyMem silver to the wound bed. She is having her custom compression garments adjusted as these are not staying on very easily. 8/30; patient presents for follow-up. We have been using PolyMem silver to the wound bed. She is still waiting on her custom compression garments to be ordered. She denies signs of infection. 8/11; patient presents for follow-up. The wound VAC has been started and patient has been using this for the past week. Patient has home health who changes the wound VAC. She developed irritation to the periwound. DuoDERM was not being used to the periwound despite being ordered. 8/15; patient presents for follow-up. She restarted the wound VAC and has had improvement to the periwound with the use of DuoDERM. 8/24; patient presents for follow-up. She has been using the wound VAC. She has developed a wound just superior to the original wound. Likely as a result from the wound VAC. She denies signs of infection. 8/29;  patient presents for follow-up. We have been using collagen to the wound bed. She has held off on the wound VAC for the past week. 9/7; patient presents for  follow-up. We have been using iodoform packing to the wound bed. She has mild tenderness to the periwound. She denies systemic signs of infection. 9/12; patient presents for follow-up. Patient has been using Dakin's wet-to-dry dressings. She has no issues or complaints today. She denies signs of infection. 9/21; patient presents for follow-up. PuraPly #1 was placed in standard fashion at last clinic visit. She has no issues or complaints today. She denies signs of infection. 10/3; patient presents for follow-up. PuraPly #2 was placed in standard fashion at last clinic visit. She has no issues or complaints today. She denies signs of infection. 10/10; patient presents for follow-up. PuraPly #3 was placed in standard fashion at last clinic visit. She has no issues or complaints today. She denies signs of infection. 10/17; patient presents for follow-up. She has been using silver alginate to the tunneled wound. She has no issues or complaints today. 10/31; patient presents for follow-up. We have been doing Dakin's wet-to-dry dressings to the tunneled wound. The wound VAC has been ordered however patient has not received this. She currently denies signs of infection. She reports a lot of serosanguineous drainage. 11/14; patient presents for follow-up. She had a PCR culture done at last clinic visit that grew Pseudomonas aeruginosa. She obtained Keystone antibiotic spray yesterday. She also obtained her ultrasound of the left lower extremity to assess for abscess however the vascular lab ordered On erroneous test. This has been rectified. Patient was not charged for the test. DVT study showed no DVT T also reported to our nurse that they did not notice any abscess s. Christen Butter while doing the DVT rule out. 11/21; patient presents for follow-up. She has been using Keystone antibiotic with gauze packing. She has no issues or complaints today. 11/30; patient presents for follow-up. She has been using Keystone  antibiotic with gauze packing. Depth is slightly larger today. She denies signs of infection. 12/5; patient presents for follow-up. She has been using Keystone antibiotic spray with collagen. She has no issues or complaints today. She has been using an Ace wrap for compression therapy. 12/12; patient presents for follow-up. She has been using Keystone antibiotic spray with collagen and Ace wrap for compression. She states that the Ace wrap falls down during the day. She is only able to use this at night. 12/19; patient presents for follow-up. She has been using Keystone antibiotic spray with collagen. She is unable to use an Ace wrap during the day because she states it falls down however she is able to use it at night without issues. She denies signs of infection. Electronic Signature(s) Signed: 08/09/2022 12:56:05 PM By: Geralyn Corwin DO Entered By: Geralyn Corwin on 08/09/2022 10:54:29 [name] ([id number]) [id number] Page 3 of 10 -------------------------------------------------------------------------------- Physical Exam Details Patient Name: Date of Service: [name] 08/09/2022 10:30 A M Medical Record Number: [id number] Patient Account Number: [id number] Date of Birth/Sex: Treating RN: [birth year] (71 y.o. F) Primary Care Provider: [name] Other Clinician: Referring Provider: Treating Provider/Extender: Annamary Rummage in Treatment: 37 Constitutional respirations regular, non-labored and within target range for patient.. Cardiovascular 2+ dorsalis pedis/posterior tibialis pulses. Psychiatric pleasant and cooperative. Notes Left lower extremity: Open wound with granulation tissue at the opening with a depth of 2.3 cm no increased warmth, erythema or purulent drainage. No tenderness on  palpation. Uncontrolled lymphedema above the knee. Electronic Signature(s) Signed: 08/09/2022 12:56:05 PM By:  Geralyn Corwin DO Entered By: Geralyn Corwin on 08/09/2022 10:54:55 -------------------------------------------------------------------------------- Physician Orders Details Patient Name: Date of Service: Marthenia Rolling [name] 08/09/2022 10:30 A M Medical Record Number: [id number] Patient Account Number: [id number] Date of Birth/Sex: Treating RN: Mar 08, 1951 (71 y.o. Ardis Rowan, [name] Primary Care Provider: [name] Other Clinician: Referring Provider: Treating Provider/Extender: Annamary Rummage in Treatment: [id number] Verbal / Phone Orders: No Diagnosis Coding Follow-up Appointments ppointment in 1 week. - Dr. Mikey Bussing Return A ppointment in 2 weeks. - Dr. Mikey Bussing Return A Other: - Continue Topical antibiotic ointment. use clobetasol antifungal at home around the wound. Anesthetic (In clinic) Topical Lidocaine 5% applied to wound bed Cellular or Tissue Based Products Wound #1 Left,Lateral Lower Leg Cellular or Tissue Based Product Type: - 05/12/2022 Puraply AM #1 applied. 05/17/22 PURAPLY AM # 2 05/24/2022 Puraply AM #3 05/31/2022 HOLD Bathing/ Shower/ Hygiene May shower with protection but do not get wound dressing(s) wet. Edema Control - Lymphedema / SCD / Other Elevate legs to the level of the heart or above for 30 minutes daily and/or when sitting, a frequency of: - 3-4 times a day throughout the day. Avoid standing for long periods of time. Moisturize legs daily. - both legs every night before bed. AMBERT, VIRRUETA ([id number]) [id number] Page 4 of 10 Wound Treatment Wound #1 - Lower Leg Wound Laterality: Left, Lateral Cleanser: Wound Cleanser (Home Health) 1 x Per Day/30 Days Discharge Instructions: Cleanse the wound with wound cleanser prior to applying a clean dressing using gauze sponges, not tissue or cotton balls. Peri-Wound Care: Ketoconazole Cream 2% 1 x Per Day/30 Days Discharge Instructions: Apply to  periwound in clinic. you use clobetasol at home. Topical: keystone 1 x Per Day/30 Days Discharge Instructions: Soak collagen in the keystone and pack into wound. Collage may be cut into thin strip. Prim Dressing: Promogran Prisma Matrix, 4.34 (sq in) (silver collagen) (Generic) 1 x Per Day/30 Days ary Discharge Instructions: Moisten collagen with saline or hydrogel Prim Dressing: Plain packing strip 1/4 (in) 1 x Per Day/30 Days ary Discharge Instructions: Lightly pack a small amount behind the collagen. leave a tail out of the wound to remove. Prim Dressing: Compounding topical antibiotics 1 x Per Day/30 Days ary Discharge Instructions: apply antibiotic moisted gauze lightly packed into wound bed. Secondary Dressing: ABD Pad, 5x9 1 x Per Day/30 Days Discharge Instructions: Apply over primary dressing as directed. Secondary Dressing: Woven Gauze Sponge, Non-Sterile 4x4 in (Home Health) 1 x Per Day/30 Days Discharge Instructions: Apply over primary dressing as directed. Secured With: [employer], 4.5x3.1 (in/yd) (Home Health) 1 x Per Day/30 Days Discharge Instructions: Secure with Kerlix as directed. Secured With: 10M Medipore H Soft Cloth Surgical T ape, 4 x 10 (in/yd) (Home Health) 1 x Per Day/30 Days Discharge Instructions: Secure with tape as directed. Compression Wrap: ace wrap (Home Health) 1 x Per Day/30 Days Discharge Instructions: use if the tubigrip size E does not work. Compression Wrap: tubigrip size E (Home Health) 1 x Per Day/30 Days Discharge Instructions: pull up over the knee. Electronic Signature(s) Signed: 08/09/2022 12:56:05 PM By: Geralyn Corwin DO Entered By: Geralyn Corwin on 08/09/2022 10:55:03 -------------------------------------------------------------------------------- Problem List Details Patient Name: Date of Service: Marthenia Rolling [name] 08/09/2022 10:30 A M Medical Record Number: [id number] Patient Account Number: [id number] Date of  Birth/Sex: Treating RN: 1950/09/08 (71 y.o. 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/31/2023 No Yes T79.8XXA Other early complications of trauma, initial encounter 11/19/2021 No Yes I87.312 Chronic venous hypertension (idiopathic) with ulcer of left lower extremity 04/28/2022 No Yes MADELL, HEINO ([id number]) [phone].pdf Page 5 of 10 I89.0 Lymphedema, not elsewhere classified 04/28/2022 No Yes I48.0 Paroxysmal atrial fibrillation 11/19/2021 No Yes Z79.01 Long term (current) use of anticoagulants 11/19/2021 No Yes Inactive Problems Resolved Problems Electronic Signature(s) Signed: 08/09/2022 12:56:05 PM By: Geralyn Corwin DO Entered By: Geralyn Corwin on 08/09/2022 10:53:19 -------------------------------------------------------------------------------- Progress Note Details Patient Name: Date of Service: Marthenia Rolling [name] 08/09/2022 10:30 A M Medical Record Number: [id number] Patient Account Number: [id number] Date of Birth/Sex: Treating RN: 27-Dec-1950 (71 y.o. F) Primary Care Provider: [name] Other Clinician: Referring Provider: Treating Provider/Extender: Annamary Rummage in Treatment: 37 Subjective Chief Complaint Information obtained from Patient 11/19/2021; Left lower extremity wound status post fall History of Present Illness (HPI) Admission 11/19/2021 Ms. Jerline Linzy is a 71 year old female with a past medical history of paroxysmal A-fib on Eliquis, hypothyroidism, major depressive disorder, venous insufficiency and chronic diastolic heart failure that presents to the clinic for a 1 month history of nonhealing wound to the left  lower extremity. She visited the ED on 10/18/2021 after a mechanical fall. She developed a hematoma that subsequently opened. She was hospitalized for 7 days and discharged on 10/25/2021. She has been on several different antibiotics For the past month. She states that most recently she was on Levaquin and linezolid for the past week. She states she completes her antibiotic course tomorrow. She has been using Dakin's wet-to-dry dressings to the wound bed. She denies signs of infection. 4/7; patient presents for follow-up. She has been using Dakin's wet-to-dry dressings. She did end up going to the ED on 4/1 because she had excess bleeding with dressing change that she could not stop. She is on Eliquis for A-fib. In the ED they tied off a small artery. She has had no issues since discharge. She denies signs of infection. 4/14; this is a very difficult clinical situation. A patient with underlying chronic venous insufficiency and lymphedema very significant lower extremity edema had a hematoma after a fall on her left upper lateral lower leg. She is on Eliquis for atrial fibrillation apparently with a history of a splenic infarct following with Dr. Berton Mount of cardiology. She has exhibited significant bleeding from the wound surface including ao Venous bleeder that required suturing short while ago. She has been using Dakin's wet-to-dry packing and over the surface of the wound. She saw Dr. Graciela Husbands yesterday he is reluctant to consider stopping the Eliquis because of the prior history of presumed cardioembolism. Wants to communicate with Dr. Mikey Bussing when she returns. In a perfect world where she was not on Eliquis she requires a wound VAC with additional compression wraps but I understand the reluctance to do this because of the concerns of bleeding 4/28; the patient's wound actually looks better today using Dakin's wet-to-dry that she is changing twice a day she is wrapping this with Kerlix and  Ace wrapping. She tells me she had 2 small bleeding areas which were part of the superficial wound that stopped this week with direct pressure. Other than that no major issues. In follow-up from discussion of last week Dr.  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 The bigger question from my point of view is the wound VAC issue. As far as she knows and her daughter-in-law verifies that she has not had any bleeding from the deeper parts of the wound although the bleeding has been superficial including the one that sent her to the ER for stitches. She is concerned that a wound VAC would cause further bleeding and I cannot completely allay those concerns. 5/8; patient presents for follow-up. She has no issues or complaints today. She has been using Dakin's wet-to-dry dressings without issues. She denies signs of infection. NATARA, MONFORT ([id number]) [id number] Page 6 of 10 5/12; patient presents for follow-up. She continues to use Dakin's wet-to-dry dressings without issues. She denies signs of infection. She reports some issues with bleeding at times but this has improved. She denies signs of infection. 6/1; patient presents for follow-up. She was recently hospitalized for upper left leg thigh cellulitis. She was given IV cefepime and vancomycin and discharged on oral antibiotics. She has been using Dakin's wet-to-dry dressings to the left lower leg wound. She reports improvement in healing. She currently denies systemic signs of infection. 6/7; patient is using Dakin's wet-to-dry twice daily. In general this looks better than when I saw this a month or so ago however still considerable depth to the tunnel in her left leg. She is going for iron infusions ordered by her primary care doctor I  believe 6/15; patient presents for follow-up. She has been using Dakin's wet-to-dry dressings. She has noted some scattered small areas that blister up and heal on her left lower leg. She has been using mupirocin ointment on them. Nothing open today. 6/23; patient's been using Dakin's wet-to-dry dressings to the tunneled wound and Hydrofera Blue to the opening. She has no issues or complaints today. 6/29; patient presents for follow-up. She has been using Dakin's wet-to-dry dressings to the tunneled wound and Hydrofera Blue to the opening. She states that [facility] is sticking to the wound bed. She denies signs of infection. 7/7; patient presents for follow-up. She has been using Dakin's wet-to-dry dressings to the tunneled wound and PolyMem silver to the opening without issues. 7/13; patient presents for follow-up. She has been using PolyMem silver to the opening and the rope to the tunnel. She has no issues or complaints today. She denies signs of infection. 7/20; patient presents for follow-up. We have been using PolyMem silver to the wound bed. She has no issues or complaints today. She is receiving her custom compression garments tomorrow. 7/27; patient presents for follow-up. She has been using PolyMem silver to the wound bed. She is having her custom compression garments adjusted as these are not staying on very easily. 8/30; patient presents for follow-up. We have been using PolyMem silver to the wound bed. She is still waiting on her custom compression garments to be ordered. She denies signs of infection. 8/11; patient presents for follow-up. The wound VAC has been started and patient has been using this for the past week. Patient has home health who changes the wound VAC. She developed irritation to the periwound. DuoDERM was not being used to the periwound despite being ordered. 8/15; patient presents for follow-up. She restarted the wound VAC and has had improvement to the  periwound with the use of  DuoDERM. 8/24; patient presents for follow-up. She has been using the wound VAC. She has developed a wound just superior to the original wound. Likely as a result from the wound VAC. She denies signs of infection. 8/29; patient presents for follow-up. We have been using collagen to the wound bed. She has held off on the wound VAC for the past week. 9/7; patient presents for follow-up. We have been using iodoform packing to the wound bed. She has mild tenderness to the periwound. She denies systemic signs of infection. 9/12; patient presents for follow-up. Patient has been using Dakin's wet-to-dry dressings. She has no issues or complaints today. She denies signs of infection. 9/21; patient presents for follow-up. PuraPly #1 was placed in standard fashion at last clinic visit. She has no issues or complaints today. She denies signs of infection. 10/3; patient presents for follow-up. PuraPly #2 was placed in standard fashion at last clinic visit. She has no issues or complaints today. She denies signs of infection. 10/10; patient presents for follow-up. PuraPly #3 was placed in standard fashion at last clinic visit. She has no issues or complaints today. She denies signs of infection. 10/17; patient presents for follow-up. She has been using silver alginate to the tunneled wound. She has no issues or complaints today. 10/31; patient presents for follow-up. We have been doing Dakin's wet-to-dry dressings to the tunneled wound. The wound VAC has been ordered however patient has not received this. She currently denies signs of infection. She reports a lot of serosanguineous drainage. 11/14; patient presents for follow-up. She had a PCR culture done at last clinic visit that grew Pseudomonas aeruginosa. She obtained Keystone antibiotic spray yesterday. She also obtained her ultrasound of the left lower extremity to assess for abscess however the vascular lab ordered On erroneous  test. This has been rectified. Patient was not charged for the test. DVT study showed no DVT T also reported to our nurse that they did not notice any abscess s. Christen Butter while doing the DVT rule out. 11/21; patient presents for follow-up. She has been using Keystone antibiotic with gauze packing. She has no issues or complaints today. 11/30; patient presents for follow-up. She has been using Keystone antibiotic with gauze packing. Depth is slightly larger today. She denies signs of infection. 12/5; patient presents for follow-up. She has been using Keystone antibiotic spray with collagen. She has no issues or complaints today. She has been using an Ace wrap for compression therapy. 12/12; patient presents for follow-up. She has been using Keystone antibiotic spray with collagen and Ace wrap for compression. She states that the Ace wrap falls down during the day. She is only able to use this at night. 12/19; patient presents for follow-up. She has been using Keystone antibiotic spray with collagen. She is unable to use an Ace wrap during the day because she states it falls down however she is able to use it at night without issues. She denies signs of infection. Patient History Medical History Cardiovascular Patient has history of Congestive Heart Failure, Hypertension Hospitalization/Surgery History - Cellulitis left leg- 01/03/2022-01/07/2022. Medical A Surgical History Notes nd Constitutional Symptoms (General Health) Infarction of spleen Hematologic/Lymphatic ZAINEB, NOWACZYK ([id number]) [phone].pdf Page 7 of 10 Hypothyroidism Cardiovascular A-Fib Gastrointestinal Gastroesophageal reflux Genitourinary Chronic kidney disease Musculoskeletal Osteoarthritis of knee Psychiatric Anxiety Objective Constitutional respirations regular, non-labored and within target range for patient.. Vitals Time Taken: 10:21 AM, Height: 67 in, Weight: 340 lbs, BMI: 53.2,  Temperature: 98.1 F, Pulse: 56 bpm, Respiratory  Rate: 18 breaths/min, Blood Pressure: 176/73 mmHg. Cardiovascular 2+ dorsalis pedis/posterior tibialis pulses. Psychiatric pleasant and cooperative. General Notes: Left lower extremity: Open wound with granulation tissue at the opening with a depth of 2.3 cm no increased warmth, erythema or purulent drainage. No tenderness on palpation. Uncontrolled lymphedema above the knee. Integumentary (Hair, Skin) Wound #1 status is Open. Original cause of wound was Trauma. The date acquired was: 10/18/2021. The wound has been in treatment 37 weeks. The wound is located on the Left,Lateral Lower Leg. The wound measures 0.2cm length x 0.2cm width x 0.3cm depth; 0.031cm^2 area and 0.009cm^3 volume. There is Fat Layer (Subcutaneous Tissue) exposed. There is no undermining noted, however, there is tunneling at 10:00 with a maximum distance of 2.3cm. There is a medium amount of serosanguineous drainage noted. The wound margin is distinct with the outline attached to the wound base. There is large (67-100%) red granulation within the wound bed. There is no necrotic tissue within the wound bed. The periwound skin appearance exhibited: Induration, Hemosiderin Staining. The periwound skin appearance did not exhibit: Callus, Crepitus, Excoriation, Rash, Scarring, Dry/Scaly, Maceration, Atrophie Blanche, Cyanosis, Ecchymosis, Mottled, Pallor, Rubor, Erythema. Periwound temperature was noted as No Abnormality. The periwound has tenderness on palpation. Assessment Active Problems ICD-10 Non-pressure chronic ulcer of other part of left lower leg with fat layer exposed Other early complications of trauma, initial encounter Chronic venous hypertension (idiopathic) with ulcer of left lower extremity Lymphedema, not elsewhere classified Paroxysmal atrial fibrillation Long term (current) use of anticoagulants Patient's wound has shown improvement in size since last clinic  visit. I recommended [facility] and collagen. Continue compression Ace wrap at night. Her main barrier here is lymphedema. Plan Follow-up Appointments: Return Appointment in 1 week. - Dr. Mikey BussingHoffman Return Appointment in 2 weeks. - Dr. Mikey BussingHoffman Other: - Continue Topical antibiotic ointment. use clobetasol antifungal at home around the wound. Anesthetic: (In clinic) Topical Lidocaine 5% applied to wound bed Cellular or Tissue Based Products: Wound #1 Left,Lateral Lower Leg: Cellular or Tissue Based Product Type: - 05/12/2022 Puraply AM #1 applied. 05/17/22 PURAPLY AM # 2 05/24/2022 Puraply AM #3 05/31/2022 HOLD Bathing/ Shower/ Hygiene: May shower with protection but do not get wound dressing(s) wet. Edema Control - Lymphedema / SCD / OtherMariel Sleet: Lykins, Yara J ([id number]) [id number] Page 8 of 10 Elevate legs to the level of the heart or above for 30 minutes daily and/or when sitting, a frequency of: - 3-4 times a day throughout the day. Avoid standing for long periods of time. Moisturize legs daily. - both legs every night before bed. WOUND #1: - Lower Leg Wound Laterality: Left, Lateral Cleanser: Wound Cleanser (Home Health) 1 x Per Day/30 Days Discharge Instructions: Cleanse the wound with wound cleanser prior to applying a clean dressing using gauze sponges, not tissue or cotton balls. Peri-Wound Care: Ketoconazole Cream 2% 1 x Per Day/30 Days Discharge Instructions: Apply to periwound in clinic. you use clobetasol at home. Topical: keystone 1 x Per Day/30 Days Discharge Instructions: Soak collagen in the keystone and pack into wound. Collage may be cut into thin strip. Prim Dressing: Promogran Prisma Matrix, 4.34 (sq in) (silver collagen) (Generic) 1 x Per Day/30 Days ary Discharge Instructions: Moisten collagen with saline or hydrogel Prim Dressing: Plain packing strip 1/4 (in) 1 x Per Day/30 Days ary Discharge  Instructions: Lightly pack a small amount behind the collagen. leave a tail out of the wound to remove. Prim Dressing: Compounding topical antibiotics 1 x  Per Day/30 Days ary Discharge Instructions: apply antibiotic moisted gauze lightly packed into wound bed. Secondary Dressing: ABD Pad, 5x9 1 x Per Day/30 Days Discharge Instructions: Apply over primary dressing as directed. Secondary Dressing: Woven Gauze Sponge, Non-Sterile 4x4 in (Home Health) 1 x Per Day/30 Days Discharge Instructions: Apply over primary dressing as directed. Secured With: [employer], 4.5x3.1 (in/yd) (Home Health) 1 x Per Day/30 Days Discharge Instructions: Secure with Kerlix as directed. Secured With: 90M Medipore H Soft Cloth Surgical T ape, 4 x 10 (in/yd) (Home Health) 1 x Per Day/30 Days Discharge Instructions: Secure with tape as directed. Com pression Wrap: ace wrap (Home Health) 1 x Per Day/30 Days Discharge Instructions: use if the tubigrip size E does not work. Com pression Wrap: tubigrip size E (Home Health) 1 x Per Day/30 Days Discharge Instructions: pull up over the knee. 1. Keystone antibiotic solution with collagen. 2. Ace wrap 3. Follow-up in 1 week Electronic Signature(s) Signed: 08/09/2022 12:56:05 PM By: Geralyn Corwin DO Entered By: Geralyn Corwin on 08/09/2022 10:56:50 -------------------------------------------------------------------------------- HxROS Details Patient Name: Date of Service: [name], [name] 08/09/2022 10:30 A M Medical Record Number: [id number] Patient Account Number: [id number] Date of Birth/Sex: Treating RN: 05-05-1951 (71 y.o. F) Primary Care Provider: [name] Other Clinician: Referring Provider: Treating Provider/Extender: Annamary Rummage in Treatment: 37 Constitutional Symptoms (General Health) Medical History: Past Medical History Notes: Infarction of spleen Hematologic/Lymphatic Medical History: Past Medical History  Notes: Hypothyroidism Cardiovascular Medical History: Positive for: Congestive Heart Failure; Hypertension Past Medical History Notes: A-Fib Gastrointestinal Medical History: Past Medical History Notes: Gastroesophageal reflux JOZI, MALACHI ([id number]) [id number] Page 9 of 10 Genitourinary Medical History: Past Medical History Notes: Chronic kidney disease Musculoskeletal Medical History: Past Medical History Notes: Osteoarthritis of knee Psychiatric Medical History: Past Medical History Notes: Anxiety Immunizations Pneumococcal Vaccine: Received Pneumococcal Vaccination: No Implantable Devices No devices added Hospitalization / Surgery History Type of Hospitalization/Surgery Cellulitis left leg- 01/03/2022-01/07/2022 Electronic Signature(s) Signed: 08/09/2022 12:56:05 PM By: Geralyn Corwin DO Entered By: Geralyn Corwin on 08/09/2022 10:54:34 -------------------------------------------------------------------------------- SuperBill Details Patient Name: Date of Service: Clois Dupes J. 08/09/2022 Medical Record Number: [id number] Patient Account Number: [id number] Date of Birth/Sex: Treating RN: July 24, 1951 (71 y.o. F) Primary Care Provider: [name] Other Clinician: Referring Provider: Treating Provider/Extender: Annamary Rummage in Treatment: 37 Diagnosis Coding ICD-10 Codes Code Description [phone] Non-pressure chronic ulcer of other part of left lower leg with fat layer exposed T79.8XXA Other early complications of trauma, initial encounter I87.312 Chronic venous hypertension (idiopathic) with ulcer of left lower extremity I89.0 Lymphedema, not elsewhere classified I48.0 Paroxysmal atrial fibrillation Z79.01 Long term (current) use of anticoagulants Physician Procedures Electronic Signature(s) Signed: 08/09/2022 12:56:05 PM By: Geralyn Corwin DO Entered By: Geralyn Corwin on 08/09/2022  10:57:06

## 2022-08-11 NOTE — Progress Notes (Signed)
LONDA, MACKOWSKI ([id number]) [id number] Page 1 of 6 Visit Report for 08/09/2022 Arrival Information Details Patient Name: Date of Service: [name] 08/09/2022 10:30 A M Medical Record Number: [id number] Patient Account Number: [id number] Date of Birth/Sex: Treating RN: April 16, 1951 (71 y.o. F) Primary Care Sonam Huelsmann: Deland Pretty Other Clinician: Referring Kamarion Zagami: Treating Bethany Hirt/Extender: Judie Grieve in Treatment: 34 Visit Information History Since Last Visit Added or deleted any medications: No Patient Arrived: Walker Any new allergies or adverse reactions: No Arrival Time: 10:20 Had a fall or experienced change in No Accompanied By: self activities of daily living that may affect Transfer Assistance: None risk of falls: Patient Identification Verified: Yes Signs or symptoms of abuse/neglect since last visito No Secondary Verification Process Completed: Yes Hospitalized since last visit: No Patient Requires Transmission-Based Precautions: No Implantable device outside of the clinic excluding No Patient Has Alerts: Yes cellular tissue based products placed in the center Patient Alerts: Patient on Blood Thinner since last visit: Has Dressing in Place as Prescribed: Yes Pain Present Now: No Electronic Signature(s) Signed: 08/09/2022 3:33:43 PM By: Erenest Blank Entered By: Erenest Blank on 08/09/2022 10:21:20 -------------------------------------------------------------------------------- Lower Extremity Assessment Details Patient Name: Date of Service: Christine Debar [name] 08/09/2022 10:30 A M Medical Record Number: [id number] Patient Account Number: [id number] Date of Birth/Sex: Treating RN: 06/05/1951 (71 y.o. F) Primary Care Chena Chohan: Deland Pretty Other Clinician: Referring Nalleli Largent: Treating Arjuna Doeden/Extender: Judie Grieve in Treatment: 37 Edema Assessment Assessed:  Shirlyn Goltz: No] Patrice Paradise: No] Edema: [Left: Ye] [Right: s] Calf Left: Right: Point of Measurement: 31 cm From Medial Instep 47.2 cm Ankle Left: Right: Point of Measurement: 9 cm From Medial Instep 26.3 cm Electronic Signature(s) Signed: 08/09/2022 3:33:43 PM By: Erenest Blank Entered By: Erenest Blank on 08/09/2022 10:27:39 [name] ([id number]) [id number] Page 2 of 6 -------------------------------------------------------------------------------- Multi Wound Chart Details Patient Name: Date of Service: [name] 08/09/2022 10:30 A M Medical Record Number: [id number] Patient Account Number: [id number] Date of Birth/Sex: Treating RN: 09-23-1950 (71 y.o. F) Primary Care Mathew Postiglione: Deland Pretty Other Clinician: Referring Mancil Pfenning: Treating Maliea Grandmaison/Extender: Judie Grieve in Treatment: 37 Vital Signs Height(in): 67 Pulse(bpm): 56 Weight(lbs): 340 Blood Pressure(mmHg): 176/73 Body Mass Index(BMI): 53.2 Temperature(F): 98.1 Respiratory Rate(breaths/min): 18 [1:Photos:] [N/A:N/A] Left, Lateral Lower Leg N/A N/A Wound Location: Trauma N/A N/A Wounding Event: Trauma, Other N/A N/A Primary Etiology: Congestive Heart Failure, N/A N/A Comorbid History: Hypertension 10/18/2021 N/A N/A Date Acquired: 47 N/A N/A Weeks of Treatment: Open N/A N/A Wound Status: No N/A N/A Wound Recurrence: Yes N/A N/A Clustered Wound: 1 N/A N/A Clustered Quantity: 0.2x0.2x0.3 N/A N/A Measurements L x W x D (cm) 0.031 N/A N/A A (cm) : rea 0.009 N/A N/A Volume (cm) : 100.00% N/A N/A % Reduction in A rea: 100.00% N/A N/A % Reduction in Volume: 10 Position 1 (o'clock): 2.3 Maximum Distance 1 (cm): Yes N/A N/A Tunneling: Full Thickness With Exposed Support N/A N/A Classification: Structures Medium N/A N/A Exudate Amount: Serosanguineous N/A N/A Exudate Type: red, brown N/A N/A Exudate Color: Distinct, outline  attached N/A N/A Wound Margin: Large (67-100%) N/A N/A Granulation Amount: Red N/A N/A Granulation Quality: None Present (0%) N/A N/A Necrotic Amount: Fat Layer (Subcutaneous Tissue): Yes N/A N/A Exposed Structures: Fascia: No Tendon: No Muscle: No Joint: No Bone: No Large (67-100%) N/A N/A Epithelialization: Induration: Yes N/A N/A Periwound Skin Texture: Excoriation: No Callus: No Crepitus: No Rash: No Scarring: No Maceration: No N/A N/A  Periwound Skin Moisture: Dry/Scaly: No Hemosiderin Staining: Yes N/A N/A Periwound Skin Color: Atrophie Blanche: No Cyanosis: No Ecchymosis: No Erythema: No [name], [name] ([id number]) [id number] Page 3 of 6 Mottled: No Pallor: No Rubor: No No Abnormality N/A N/A Temperature: Yes N/A N/A Tenderness on Palpation: Treatment Notes Electronic Signature(s) Signed: 08/09/2022 12:56:05 PM By: Kalman Shan DO Entered By: Kalman Shan on 08/09/2022 10:53:26 -------------------------------------------------------------------------------- Multi-Disciplinary Care Plan Details Patient Name: Date of Service: Christine Debar [name] 08/09/2022 10:30 A M Medical Record Number: [id number] Patient Account Number: [id number] Date of Birth/Sex: Treating RN: Oct 02, 1950 (71 y.o. [name], [name] Primary Care Kellon Chalk: Deland Pretty Other Clinician: Referring Satsuki Zillmer: Treating Detravion Tester/Extender: Judie Grieve in Treatment: 37 Active Inactive Abuse / Safety / Falls / Self Care Management Nursing Diagnoses: History of Falls Potential for falls Goals: Patient/caregiver will verbalize/demonstrate measure taken to improve self care Date Initiated: 11/19/2021 Date Inactivated: 04/05/2022 Target Resolution Date: 04/21/2022 Goal Status: Met Patient/caregiver will verbalize/demonstrate measures taken to prevent injury and/or falls Date Initiated: 11/19/2021 Target Resolution Date:  09/22/2022 Goal Status: Active Interventions: Provide education on basic hygiene Provide education on fall prevention Provide education on HBO safety Provide education on vaccinations Treatment Activities: Education provided on Basic Hygiene : 05/17/2022 Notes: Pain, Acute or Chronic Nursing Diagnoses: Pain, acute or chronic: actual or potential Potential alteration in comfort, pain Goals: Patient will verbalize adequate pain control and receive pain control interventions during procedures as needed Date Initiated: 11/19/2021 Date Inactivated: 04/05/2022 Target Resolution Date: 04/22/2022 Goal Status: Met Patient/caregiver will verbalize comfort level met Date Initiated: 11/19/2021 Target Resolution Date: 09/15/2022 Goal Status: Active Interventions: Encourage patient to take pain medications as prescribed Provide education on pain management [name], [name] ([id number]) [phone].pdf Page 4 of 6 Reposition patient for comfort Treatment Activities: Administer pain control measures as ordered : 11/19/2021 Notes: Electronic Signature(s) Signed: 08/10/2022 5:39:23 PM By: Rhae Hammock RN Entered By: Rhae Hammock on 08/09/2022 10:43:38 -------------------------------------------------------------------------------- Pain Assessment Details Patient Name: Date of Service: Christine Debar [name] 08/09/2022 10:30 A M Medical Record Number: [id number] Patient Account Number: [id number] Date of Birth/Sex: Treating RN: October 20, 1950 (72 y.o. F) Primary Care Khylie Larmore: Deland Pretty Other Clinician: Referring Ethleen Lormand: Treating Graeden Bitner/Extender: Judie Grieve in Treatment: 37 Active Problems Location of Pain Severity and Description of Pain Patient Has Paino No Site Locations Pain Management and Medication Current Pain Management: Electronic Signature(s) Signed: 08/09/2022 3:33:43 PM By: Erenest Blank Entered By: Erenest Blank on  08/09/2022 10:21:49 -------------------------------------------------------------------------------- Patient/Caregiver Education Details Patient Name: Date of Service: [name] 12/19/2023andnbsp10:30 A M Medical Record Number: [id number] Patient Account Number: [id number] Date of Birth/Gender: Treating RN: 07/01/1951 (71 y.o. [name], [name] Primary Care Physician: Deland Pretty Other Clinician: Referring Physician: Treating Physician/Extender: Judie Grieve in Treatment: [address] [place] ([id number]) [id number] Page 5 of 6 Education Assessment Education Provided To: Patient Education Topics Provided Wound/Skin Impairment: Methods: Explain/Verbal Responses: Reinforcements needed, State content correctly Electronic Signature(s) Signed: 08/10/2022 5:39:23 PM By: Rhae Hammock RN Entered By: Rhae Hammock on 08/09/2022 10:43:50 -------------------------------------------------------------------------------- Wound Assessment Details Patient Name: Date of Service: Christine Simmonds J. 08/09/2022 10:30 A M Medical Record Number: [id number] Patient Account Number: [id number] Date of Birth/Sex: Treating RN: 12/25/1950 (71 y.o. F) Primary Care Makinley Muscato: Deland Pretty Other Clinician: Referring Vinita Prentiss: Treating Zahari Fazzino/Extender: Judie Grieve in Treatment: 37 Wound Status Wound Number: 1 Primary Etiology: Trauma, Other Wound Location: Left, Lateral Lower Leg Wound Status:  Open Wounding Event: Trauma Comorbid History: Congestive Heart Failure, Hypertension Date Acquired: 10/18/2021 Weeks Of Treatment: 37 Clustered Wound: Yes Photos Wound Measurements Length: (cm) Width: (cm) Depth: (cm) Clustered Quantity: Area: (cm) Volume: (cm) 0.2 % Reduction in Area: 100% 0.2 % Reduction in Volume: 100% 0.3 Epithelialization: Large (67-100%) 1 Tunneling: Yes 0.031 Position  (o'clock): 10 0.009 Maximum Distance: (cm) 2.3 Undermining: No Wound Description Classification: Full Thickness With Exposed Support Wound Margin: Distinct, outline attached Exudate Amount: Medium Exudate Type: Serosanguineous Exudate Color: red, brown [name], [name] ([id number]) Wound Bed Granulation Amount: Large (67-100%) Granulation Quality: Red Necrotic Amount: None Present (0%) Structures Foul Odor After Cleansing: No Slough/Fibrino No [id number] Page 6 of 6 Exposed Structure Fascia Exposed: No Fat Layer (Subcutaneous Tissue) Exposed: Yes Tendon Exposed: No Muscle Exposed: No Joint Exposed: No Bone Exposed: No Periwound Skin Texture Texture Color No Abnormalities Noted: No No Abnormalities Noted: No Callus: No Atrophie Blanche: No Crepitus: No Cyanosis: No Excoriation: No Ecchymosis: No Induration: Yes Erythema: No Rash: No Hemosiderin Staining: Yes Scarring: No Mottled: No Pallor: No Moisture Rubor: No No Abnormalities Noted: No Dry / Scaly: No Temperature / Pain Maceration: No Temperature: No Abnormality Tenderness on Palpation: Yes Electronic Signature(s) Signed: 08/09/2022 3:33:43 PM By: Erenest Blank Entered By: Erenest Blank on 08/09/2022 10:29:20 -------------------------------------------------------------------------------- Vitals Details Patient Name: Date of Service: Christine Debar [name] 08/09/2022 10:30 A M Medical Record Number: [id number] Patient Account Number: [id number] Date of Birth/Sex: Treating RN: Aug 04, 1951 (71 y.o. F) Primary Care Niranjan Rufener: Deland Pretty Other Clinician: Referring Prisca Gearing: Treating Mercadez Heitman/Extender: Judie Grieve in Treatment: 37 Vital Signs Time Taken: 10:21 Temperature (F): 98.1 Height (in): 67 Pulse (bpm): 56 Weight (lbs): 340 Respiratory Rate (breaths/min): 18 Body Mass Index (BMI): 53.2 Blood Pressure (mmHg): 176/73 Reference Range: 80 -  120 mg / dl Electronic Signature(s) Signed: 08/09/2022 3:33:43 PM By: Erenest Blank Entered By: Erenest Blank on 08/09/2022 10:21:39

## 2022-08-12 DIAGNOSIS — Z7901 Long term (current) use of anticoagulants: Secondary | ICD-10-CM | POA: Diagnosis not present

## 2022-08-12 DIAGNOSIS — Z9181 History of falling: Secondary | ICD-10-CM | POA: Diagnosis not present

## 2022-08-12 DIAGNOSIS — T24332D Burn of third degree of left lower leg, subsequent encounter: Secondary | ICD-10-CM | POA: Diagnosis not present

## 2022-08-12 DIAGNOSIS — F325 Major depressive disorder, single episode, in full remission: Secondary | ICD-10-CM | POA: Diagnosis not present

## 2022-08-12 DIAGNOSIS — I5032 Chronic diastolic (congestive) heart failure: Secondary | ICD-10-CM | POA: Diagnosis not present

## 2022-08-12 DIAGNOSIS — I4891 Unspecified atrial fibrillation: Secondary | ICD-10-CM | POA: Diagnosis not present

## 2022-08-12 DIAGNOSIS — J309 Allergic rhinitis, unspecified: Secondary | ICD-10-CM | POA: Diagnosis not present

## 2022-08-12 DIAGNOSIS — E039 Hypothyroidism, unspecified: Secondary | ICD-10-CM | POA: Diagnosis not present

## 2022-08-12 DIAGNOSIS — D500 Iron deficiency anemia secondary to blood loss (chronic): Secondary | ICD-10-CM | POA: Diagnosis not present

## 2022-08-12 DIAGNOSIS — Z6841 Body Mass Index (BMI) 40.0 and over, adult: Secondary | ICD-10-CM | POA: Diagnosis not present

## 2022-08-12 DIAGNOSIS — Z9049 Acquired absence of other specified parts of digestive tract: Secondary | ICD-10-CM | POA: Diagnosis not present

## 2022-08-12 DIAGNOSIS — I110 Hypertensive heart disease with heart failure: Secondary | ICD-10-CM | POA: Diagnosis not present

## 2022-08-12 DIAGNOSIS — Z96653 Presence of artificial knee joint, bilateral: Secondary | ICD-10-CM | POA: Diagnosis not present

## 2022-08-16 ENCOUNTER — Encounter (HOSPITAL_BASED_OUTPATIENT_CLINIC_OR_DEPARTMENT_OTHER): Payer: PPO | Admitting: Internal Medicine

## 2022-08-16 DIAGNOSIS — T798XXA Other early complications of trauma, initial encounter: Secondary | ICD-10-CM

## 2022-08-16 DIAGNOSIS — L97822 Non-pressure chronic ulcer of other part of left lower leg with fat layer exposed: Secondary | ICD-10-CM

## 2022-08-16 DIAGNOSIS — I890 Lymphedema, not elsewhere classified: Secondary | ICD-10-CM | POA: Diagnosis not present

## 2022-08-16 DIAGNOSIS — I87312 Chronic venous hypertension (idiopathic) with ulcer of left lower extremity: Secondary | ICD-10-CM

## 2022-08-16 NOTE — Progress Notes (Signed)
PAILYNN, VAHEY ([id number]) [id number] Page 1 of 10 Visit Report for 08/16/2022 Chief Complaint Document Details Patient Name: Date of Service: [name] 08/16/2022 10:30 A M Medical Record Number: [id number] Patient Account Number: [id number] Date of Birth/Sex: Treating RN: 1950/09/04 (71 y.o. F) Primary Care Provider: [name] Other Clinician: Referring Provider: Treating Provider/Extender: Annamary Rummage in Treatment: 78 Information Obtained from: Patient Chief Complaint 11/19/2021; Left lower extremity wound status post fall Electronic Signature(s) Signed: 08/16/2022 3:18:07 PM By: Geralyn Corwin DO Entered By: Geralyn Corwin on 08/16/2022 11:42:00 -------------------------------------------------------------------------------- HPI Details Patient Name: Date of Service: Christine Rolling LYN [name]. 08/16/2022 10:30 A M Medical Record Number: [id number] Patient Account Number: [id number] Date of Birth/Sex: Treating RN: 03/18/1951 (71 y.o. F) Primary Care Provider: [name] Other Clinician: Referring Provider: Treating Provider/Extender: Annamary Rummage in Treatment: 84 History of Present Illness HPI Description: Admission 11/19/2021 [name] is a 71 year old female with a past medical history of paroxysmal A-fib on Eliquis, hypothyroidism, major depressive disorder, venous insufficiency and chronic diastolic heart failure that presents to the clinic for a 1 month history of nonhealing wound to the left lower extremity. She visited the ED on 10/18/2021 after a mechanical fall. She developed a hematoma that subsequently opened. She was hospitalized for 7 days and discharged on 10/25/2021. She has been on several different antibiotics For the past month. She states that most recently she was on Levaquin and linezolid for the past week. She states she completes her antibiotic course  tomorrow. She has been using Dakin's wet-to-dry dressings to the wound bed. She denies signs of infection. 4/7; patient presents for follow-up. She has been using Dakin's wet-to-dry dressings. She did end up going to the ED on 4/1 because she had excess bleeding with dressing change that she could not stop. She is on Eliquis for A-fib. In the ED they tied off a small artery. She has had no issues since discharge. She denies signs of infection. 4/14; this is a very difficult clinical situation. A patient with underlying chronic venous insufficiency and lymphedema very significant lower extremity edema had a hematoma after a fall on her left upper lateral lower leg. She is on Eliquis for atrial fibrillation apparently with a history of a splenic infarct following with Dr. Berton Mount of cardiology. She has exhibited significant bleeding from the wound surface including ao Venous bleeder that required suturing short while ago. She has been using Dakin's wet-to-dry packing and over the surface of the wound. She saw Dr. Graciela Husbands yesterday he is reluctant to consider stopping the Eliquis because of the prior history of presumed cardioembolism. Wants to communicate with Dr. Mikey Bussing when she returns. In a perfect world where she was not on Eliquis she requires a wound VAC with additional compression wraps but I understand the reluctance to do this because of the concerns of bleeding 4/28; the patient's wound actually looks better today using Dakin's wet-to-dry that she is changing twice a day she is wrapping this with Kerlix and Ace wrapping. She tells me she had 2 small bleeding areas which were part of the superficial wound that stopped this week with direct pressure. Other than that no major issues. In follow-up from discussion of last week Dr. 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  The bigger question from my point of view is the wound VAC issue. As far as she knows and her daughter-in-law verifies that she has not had any bleeding from the deeper parts of the wound although the bleeding has been superficial including the one that sent her to the ER for stitches. She is concerned that a wound VAC would cause further bleeding and I cannot completely allay those concerns. BRADLEE, BRIDGERS ([id number]) [id number] Page 2 of 10 5/8; patient presents for follow-up. She has no issues or complaints today. She has been using Dakin's wet-to-dry dressings without issues. She denies signs of infection. 5/12; patient presents for follow-up. She continues to use Dakin's wet-to-dry dressings without issues. She denies signs of infection. She reports some issues with bleeding at times but this has improved. She denies signs of infection. 6/1; patient presents for follow-up. She was recently hospitalized for upper left leg thigh cellulitis. She was given IV cefepime and vancomycin and discharged on oral antibiotics. She has been using Dakin's wet-to-dry dressings to the left lower leg wound. She reports improvement in healing. She currently denies systemic signs of infection. 6/7; patient is using Dakin's wet-to-dry twice daily. In general this looks better than when I saw this a month or so ago however still considerable depth to the tunnel in her left leg. She is going for iron infusions ordered by her primary care doctor I believe 6/15; patient presents for follow-up. She has been using Dakin's wet-to-dry dressings. She has noted some scattered small areas that blister up and heal on her left lower leg. She has been using mupirocin ointment on them. Nothing open today. 6/23; patient's been using Dakin's wet-to-dry dressings to the tunneled wound and Hydrofera Blue to the opening. She has no issues or complaints  today. 6/29; patient presents for follow-up. She has been using Dakin's wet-to-dry dressings to the tunneled wound and Hydrofera Blue to the opening. She states that [facility] is sticking to the wound bed. She denies signs of infection. 7/7; patient presents for follow-up. She has been using Dakin's wet-to-dry dressings to the tunneled wound and PolyMem silver to the opening without issues. 7/13; patient presents for follow-up. She has been using PolyMem silver to the opening and the rope to the tunnel. She has no issues or complaints today. She denies signs of infection. 7/20; patient presents for follow-up. We have been using PolyMem silver to the wound bed. She has no issues or complaints today. She is receiving her custom compression garments tomorrow. 7/27; patient presents for follow-up. She has been using PolyMem silver to the wound bed. She is having her custom compression garments adjusted as these are not staying on very easily. 8/30; patient presents for follow-up. We have been using PolyMem silver to the wound bed. She is still waiting on her custom compression garments to be ordered. She denies signs of infection. 8/11; patient presents for follow-up. The wound VAC has been started and patient has been using this for the past week. Patient has home health who changes the wound VAC. She developed irritation to the periwound. DuoDERM was not being used to the periwound despite being ordered. 8/15; patient presents for follow-up. She restarted the wound VAC and has had improvement to the periwound with the use of DuoDERM. 8/24; patient presents for follow-up. She has been using the wound VAC. She has developed a wound just superior to the original wound. Likely as a result from the wound VAC. She denies signs of infection. 8/29;  patient presents for follow-up. We have been using collagen to the wound bed. She has held off on the wound VAC for the past week. 9/7; patient presents for  follow-up. We have been using iodoform packing to the wound bed. She has mild tenderness to the periwound. She denies systemic signs of infection. 9/12; patient presents for follow-up. Patient has been using Dakin's wet-to-dry dressings. She has no issues or complaints today. She denies signs of infection. 9/21; patient presents for follow-up. PuraPly #1 was placed in standard fashion at last clinic visit. She has no issues or complaints today. She denies signs of infection. 10/3; patient presents for follow-up. PuraPly #2 was placed in standard fashion at last clinic visit. She has no issues or complaints today. She denies signs of infection. 10/10; patient presents for follow-up. PuraPly #3 was placed in standard fashion at last clinic visit. She has no issues or complaints today. She denies signs of infection. 10/17; patient presents for follow-up. She has been using silver alginate to the tunneled wound. She has no issues or complaints today. 10/31; patient presents for follow-up. We have been doing Dakin's wet-to-dry dressings to the tunneled wound. The wound VAC has been ordered however patient has not received this. She currently denies signs of infection. She reports a lot of serosanguineous drainage. 11/14; patient presents for follow-up. She had a PCR culture done at last clinic visit that grew Pseudomonas aeruginosa. She obtained Keystone antibiotic spray yesterday. She also obtained her ultrasound of the left lower extremity to assess for abscess however the vascular lab ordered On erroneous test. This has been rectified. Patient was not charged for the test. DVT study showed no DVT T also reported to our nurse that they did not notice any abscess s. Christen Butter while doing the DVT rule out. 11/21; patient presents for follow-up. She has been using Keystone antibiotic with gauze packing. She has no issues or complaints today. 11/30; patient presents for follow-up. She has been using Keystone  antibiotic with gauze packing. Depth is slightly larger today. She denies signs of infection. 12/5; patient presents for follow-up. She has been using Keystone antibiotic spray with collagen. She has no issues or complaints today. She has been using an Ace wrap for compression therapy. 12/12; patient presents for follow-up. She has been using Keystone antibiotic spray with collagen and Ace wrap for compression. She states that the Ace wrap falls down during the day. She is only able to use this at night. 12/19; patient presents for follow-up. She has been using Keystone antibiotic spray with collagen. She is unable to use an Ace wrap during the day because she states it falls down however she is able to use it at night without issues. She denies signs of infection. 12/26; patient presents for follow-up. She has been using Keystone antibiotic ointment with collagen. She uses an Ace wrap to help with compression at night. She has no issues or complaints today. Electronic Signature(s) Signed: 08/16/2022 3:18:07 PM By: Geralyn Corwin DO Entered By: Geralyn Corwin on 08/16/2022 11:42:30 [name] ([id number]) [id number] Page 3 of 10 -------------------------------------------------------------------------------- Physical Exam Details Patient Name: Date of Service: [name] 08/16/2022 10:30 A M Medical Record Number: [id number] Patient Account Number: [id number] Date of Birth/Sex: Treating RN: September 26, 1950 (71 y.o. F) Primary Care Provider: [name] Other Clinician: Referring Provider: Treating Provider/Extender: Annamary Rummage in Treatment: 65 Constitutional respirations regular, non-labored and within target range for patient.. Cardiovascular 2+ dorsalis pedis/posterior tibialis pulses.  Psychiatric pleasant and cooperative. Notes Left lower extremity: Open wound with granulation tissue at the opening with a  depth of 2.0 cm no increased warmth, erythema or purulent drainage. No tenderness on palpation. Uncontrolled lymphedema above the knee. Electronic Signature(s) Signed: 08/16/2022 3:18:07 PM By: Geralyn Corwin DO Entered By: Geralyn Corwin on 08/16/2022 11:43:04 -------------------------------------------------------------------------------- Physician Orders Details Patient Name: Date of Service: Christine Rolling LYN [name]. 08/16/2022 10:30 A M Medical Record Number: [id number] Patient Account Number: [id number] Date of Birth/Sex: Treating RN: Jan 25, 1951 (71 y.o. [name] Primary Care Provider: [name] Other Clinician: Referring Provider: Treating Provider/Extender: Annamary Rummage in Treatment: 81 Verbal / Phone Orders: No Diagnosis Coding Follow-up Appointments ppointment in 1 week. - Dr. Mikey Bussing Return A ppointment in 2 weeks. - Dr. Mikey Bussing Return A Other: - Continue Topical antibiotic ointment. use clobetasol antifungal at home around the wound. Anesthetic (In clinic) Topical Lidocaine 5% applied to wound bed Cellular or Tissue Based Products Wound #1 Left,Lateral Lower Leg Cellular or Tissue Based Product Type: - 05/12/2022 Puraply AM #1 applied. 05/17/22 PURAPLY AM # 2 05/24/2022 Puraply AM #3 05/31/2022 HOLD Edema Control - Lymphedema / SCD / Other Avoid standing for long periods of time. Moisturize legs daily. - both legs every night before bed. Wound Treatment Wound #1 - Lower Leg Wound Laterality: Left, Lateral SALONI, LABLANC ([id number]) [phone].pdf Page 4 of 10 Cleanser: Wound Cleanser (Home Health) 1 x Per Day/30 Days Discharge Instructions: Cleanse the wound with wound cleanser prior to applying a clean dressing using gauze sponges, not tissue or cotton balls. Peri-Wound Care: Ketoconazole Cream 2% 1 x Per Day/30 Days Discharge Instructions: Apply to periwound in clinic. you use clobetasol at  home. Topical: keystone 1 x Per Day/30 Days Discharge Instructions: Soak collagen in the keystone and pack into wound. Collage may be cut into thin strip. Prim Dressing: Promogran Prisma Matrix, 4.34 (sq in) (silver collagen) (Generic) 1 x Per Day/30 Days ary Discharge Instructions: Moisten collagen with saline or hydrogel Prim Dressing: Plain packing strip 1/4 (in) 1 x Per Day/30 Days ary Discharge Instructions: Lightly pack a small amount behind the collagen. leave a tail out of the wound to remove. Prim Dressing: Compounding topical antibiotics 1 x Per Day/30 Days ary Discharge Instructions: apply antibiotic moisted gauze lightly packed into wound bed. Secondary Dressing: ABD Pad, 5x9 1 x Per Day/30 Days Discharge Instructions: Apply over primary dressing as directed. Secondary Dressing: Woven Gauze Sponge, Non-Sterile 4x4 in (Home Health) 1 x Per Day/30 Days Discharge Instructions: Apply over primary dressing as directed. Secured With: [employer], 4.5x3.1 (in/yd) (Home Health) 1 x Per Day/30 Days Discharge Instructions: Secure with Kerlix as directed. Secured With: 65M Medipore H Soft Cloth Surgical T ape, 4 x 10 (in/yd) (Home Health) 1 x Per Day/30 Days Discharge Instructions: Secure with tape as directed. Compression Wrap: ace wrap (Home Health) 1 x Per Day/30 Days Discharge Instructions: apply to leg and knee. Electronic Signature(s) Signed: 08/16/2022 3:18:07 PM By: Geralyn Corwin DO Entered By: Geralyn Corwin on 08/16/2022 11:43:13 -------------------------------------------------------------------------------- Problem List Details Patient Name: Date of Service: Christine Rolling LYN [name]. 08/16/2022 10:30 A M Medical Record Number: [id number] Patient Account Number: [id number] Date of Birth/Sex: Treating RN: Jan 15, 1951 (71 y.o. 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/31/2023 No Yes T79.8XXA Other early complications of trauma, initial encounter 11/19/2021 No Yes I87.312 Chronic venous hypertension (idiopathic) with ulcer of left lower extremity 04/28/2022 No Yes I89.0 Lymphedema, not elsewhere classified 04/28/2022 No Yes ALEJA, YEARWOOD ([id number]) [phone].pdf Page 5 of 10 I48.0 Paroxysmal atrial fibrillation 11/19/2021 No Yes Z79.01 Long term (current) use of anticoagulants 11/19/2021 No Yes Inactive Problems Resolved Problems Electronic Signature(s) Signed: 08/16/2022 3:18:07 PM By: Geralyn Corwin DO Entered By: Geralyn Corwin on 08/16/2022 11:41:50 -------------------------------------------------------------------------------- Progress Note Details Patient Name: Date of Service: Christine Rolling LYN [name]. 08/16/2022 10:30 A M Medical Record Number: [id number] Patient Account Number: [id number] Date of Birth/Sex: Treating RN: 07-19-1951 (71 y.o. F) Primary Care Provider: [name] Other Clinician: Referring Provider: Treating Provider/Extender: Annamary Rummage in Treatment: 60 Subjective Chief Complaint Information obtained from Patient 11/19/2021; Left lower extremity wound status post fall History of Present Illness (HPI) Admission 11/19/2021 [name] is a 71 year old female with a past medical history of paroxysmal A-fib on Eliquis, hypothyroidism, major depressive disorder, venous insufficiency and chronic diastolic heart failure that presents to the clinic for a 1 month history of nonhealing wound to the left lower extremity. She visited the ED on 10/18/2021 after a mechanical fall. She developed a hematoma that subsequently opened. She was hospitalized for 7 days and discharged on 10/25/2021. She  has been on several different antibiotics For the past month. She states that most recently she was on Levaquin and linezolid for the past week. She states she completes her antibiotic course tomorrow. She has been using Dakin's wet-to-dry dressings to the wound bed. She denies signs of infection. 4/7; patient presents for follow-up. She has been using Dakin's wet-to-dry dressings. She did end up going to the ED on 4/1 because she had excess bleeding with dressing change that she could not stop. She is on Eliquis for A-fib. In the ED they tied off a small artery. She has had no issues since discharge. She denies signs of infection. 4/14; this is a very difficult clinical situation. A patient with underlying chronic venous insufficiency and lymphedema very significant lower extremity edema had a hematoma after a fall on her left upper lateral lower leg. She is on Eliquis for atrial fibrillation apparently with a history of a splenic infarct following with Dr. Berton Mount of cardiology. She has exhibited significant bleeding from the wound surface including ao Venous bleeder that required suturing short while ago. She has been using Dakin's wet-to-dry packing and over the surface of the wound. She saw Dr. Graciela Husbands yesterday he is reluctant to consider stopping the Eliquis because of the prior history of presumed cardioembolism. Wants to communicate with Dr. Mikey Bussing when she returns. In a perfect world where she was not on Eliquis she requires a wound VAC with additional compression wraps but I understand the reluctance to do this because of the concerns of bleeding 4/28; the patient's wound actually looks better today using Dakin's wet-to-dry that she is changing twice a day she is wrapping this with Kerlix and Ace wrapping. She tells me she had 2 small bleeding areas which were part of the superficial wound that stopped this week with direct pressure. Other than that no major issues. In follow-up from  discussion of last week Dr. 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 The bigger question from my point of view is the wound VAC issue. As far as she knows and her daughter-in-law verifies that she has not had any bleeding from the deeper parts of the wound although the bleeding has been superficial including the one that sent her to the ER for stitches. She is concerned that a wound VAC would cause further bleeding and I cannot completely allay those concerns. 5/8; patient presents for follow-up. She has no issues or complaints today. She has been using Dakin's wet-to-dry dressings without issues. She denies signs of infection. 5/12; patient presents for follow-up. She continues to use Dakin's wet-to-dry dressings without issues. She denies signs of infection. She reports some issues with bleeding at times but this has improved. She denies signs of infection. 6/1; patient presents for follow-up. She was recently hospitalized for upper left leg thigh cellulitis. She was given IV cefepime and vancomycin and discharged on oral antibiotics. She has been using Dakin's wet-to-dry dressings to the left lower leg wound. She reports improvement in healing. She currently denies JI, [place] ([id number]) [id number] Page 6 of 10 systemic signs of infection. 6/7; patient is using Dakin's wet-to-dry twice daily. In general this looks better than when I saw this a month or so ago however still considerable depth to the tunnel in her left leg. She is going for iron infusions ordered by her primary care doctor I believe 6/15; patient presents for follow-up. She has been using Dakin's wet-to-dry dressings. She has noted some scattered small areas that blister up and heal on her left lower leg. She has been using  mupirocin ointment on them. Nothing open today. 6/23; patient's been using Dakin's wet-to-dry dressings to the tunneled wound and Hydrofera Blue to the opening. She has no issues or complaints today. 6/29; patient presents for follow-up. She has been using Dakin's wet-to-dry dressings to the tunneled wound and Hydrofera Blue to the opening. She states that [facility] is sticking to the wound bed. She denies signs of infection. 7/7; patient presents for follow-up. She has been using Dakin's wet-to-dry dressings to the tunneled wound and PolyMem silver to the opening without issues. 7/13; patient presents for follow-up. She has been using PolyMem silver to the opening and the rope to the tunnel. She has no issues or complaints today. She denies signs of infection. 7/20; patient presents for follow-up. We have been using PolyMem silver to the wound bed. She has no issues or complaints today. She is receiving her custom compression garments tomorrow. 7/27; patient presents for follow-up. She has been using PolyMem silver to the wound bed. She is having her custom compression garments adjusted as these are not staying on very easily. 8/30; patient presents for follow-up. We have been using PolyMem silver to the wound bed. She is still waiting on her custom compression garments to be ordered. She denies signs of infection. 8/11; patient presents for follow-up. The wound VAC has been started and patient has been using this for the past week. Patient has home health who changes the wound VAC. She developed irritation to the periwound. DuoDERM was not being used to the periwound despite being ordered. 8/15; patient presents for follow-up. She restarted the wound VAC and has had improvement to the periwound with the use of DuoDERM. 8/24; patient presents for follow-up. She has been using the wound VAC. She has developed a wound just superior to the original wound. Likely as a result from the wound VAC.  She  denies signs of infection. 8/29; patient presents for follow-up. We have been using collagen to the wound bed. She has held off on the wound VAC for the past week. 9/7; patient presents for follow-up. We have been using iodoform packing to the wound bed. She has mild tenderness to the periwound. She denies systemic signs of infection. 9/12; patient presents for follow-up. Patient has been using Dakin's wet-to-dry dressings. She has no issues or complaints today. She denies signs of infection. 9/21; patient presents for follow-up. PuraPly #1 was placed in standard fashion at last clinic visit. She has no issues or complaints today. She denies signs of infection. 10/3; patient presents for follow-up. PuraPly #2 was placed in standard fashion at last clinic visit. She has no issues or complaints today. She denies signs of infection. 10/10; patient presents for follow-up. PuraPly #3 was placed in standard fashion at last clinic visit. She has no issues or complaints today. She denies signs of infection. 10/17; patient presents for follow-up. She has been using silver alginate to the tunneled wound. She has no issues or complaints today. 10/31; patient presents for follow-up. We have been doing Dakin's wet-to-dry dressings to the tunneled wound. The wound VAC has been ordered however patient has not received this. She currently denies signs of infection. She reports a lot of serosanguineous drainage. 11/14; patient presents for follow-up. She had a PCR culture done at last clinic visit that grew Pseudomonas aeruginosa. She obtained Keystone antibiotic spray yesterday. She also obtained her ultrasound of the left lower extremity to assess for abscess however the vascular lab ordered On erroneous test. This has been rectified. Patient was not charged for the test. DVT study showed no DVT T also reported to our nurse that they did not notice any abscess s. Christen Butterech while doing the DVT rule out. 11/21;  patient presents for follow-up. She has been using Keystone antibiotic with gauze packing. She has no issues or complaints today. 11/30; patient presents for follow-up. She has been using Keystone antibiotic with gauze packing. Depth is slightly larger today. She denies signs of infection. 12/5; patient presents for follow-up. She has been using Keystone antibiotic spray with collagen. She has no issues or complaints today. She has been using an Ace wrap for compression therapy. 12/12; patient presents for follow-up. She has been using Keystone antibiotic spray with collagen and Ace wrap for compression. She states that the Ace wrap falls down during the day. She is only able to use this at night. 12/19; patient presents for follow-up. She has been using Keystone antibiotic spray with collagen. She is unable to use an Ace wrap during the day because she states it falls down however she is able to use it at night without issues. She denies signs of infection. 12/26; patient presents for follow-up. She has been using Keystone antibiotic ointment with collagen. She uses an Ace wrap to help with compression at night. She has no issues or complaints today. Patient History Medical History Cardiovascular Patient has history of Congestive Heart Failure, Hypertension Hospitalization/Surgery History - Cellulitis left leg- 01/03/2022-01/07/2022. Medical A Surgical History Notes nd Constitutional Symptoms (General Health) Infarction of spleen Hematologic/Lymphatic Hypothyroidism Cardiovascular A-Fib Mariel SleetORRIS, Sage [name] ([id number]) [id number] Page 7 of 10 Gastrointestinal Gastroesophageal reflux Genitourinary Chronic kidney disease Musculoskeletal Osteoarthritis of knee Psychiatric Anxiety Objective Constitutional respirations regular, non-labored and within target range for patient.. Vitals Time Taken: 10:46 AM, Height: 67 in, Weight: 340 lbs, BMI: 53.2, Temperature:  98 F, Pulse: 62 bpm, Respiratory Rate:  18 breaths/min, Blood Pressure: 166/70 mmHg. Cardiovascular 2+ dorsalis pedis/posterior tibialis pulses. Psychiatric pleasant and cooperative. General Notes: Left lower extremity: Open wound with granulation tissue at the opening with a depth of 2.0 cm no increased warmth, erythema or purulent drainage. No tenderness on palpation. Uncontrolled lymphedema above the knee. Integumentary (Hair, Skin) Wound #1 status is Open. Original cause of wound was Trauma. The date acquired was: 10/18/2021. The wound has been in treatment 38 weeks. The wound is located on the Left,Lateral Lower Leg. The wound measures 0.2cm length x 0.3cm width x 0.3cm depth; 0.047cm^2 area and 0.014cm^3 volume. There is Fat Layer (Subcutaneous Tissue) exposed. There is no undermining noted, however, there is tunneling at 10:00 with a maximum distance of 2cm. There is a medium amount of serosanguineous drainage noted. The wound margin is distinct with the outline attached to the wound base. There is large (67-100%) red granulation within the wound bed. There is no necrotic tissue within the wound bed. The periwound skin appearance exhibited: Induration, Hemosiderin Staining. The periwound skin appearance did not exhibit: Callus, Crepitus, Excoriation, Rash, Scarring, Dry/Scaly, Maceration, Atrophie Blanche, Cyanosis, Ecchymosis, Mottled, Pallor, Rubor, Erythema. Periwound temperature was noted as No Abnormality. The periwound has tenderness on palpation. Assessment Active Problems ICD-10 Non-pressure chronic ulcer of other part of left lower leg with fat layer exposed Other early complications of trauma, initial encounter Chronic venous hypertension (idiopathic) with ulcer of left lower extremity Lymphedema, not elsewhere classified Paroxysmal atrial fibrillation Long term (current) use of anticoagulants Patient's wound appears well-healing. I recommended continuing the course with  [facility] antibiotic ointment and collagen. The only compression therapy that seems to work for her as an Ace bandage at night. I recommended continuing this. Follow-up in 1 week. Plan Follow-up Appointments: Return Appointment in 1 week. - Dr. Mikey Bussing Return Appointment in 2 weeks. - Dr. Mikey Bussing Other: - Continue Topical antibiotic ointment. use clobetasol antifungal at home around the wound. Anesthetic: (In clinic) Topical Lidocaine 5% applied to wound bed Cellular or Tissue Based Products: Wound #1 Left,Lateral Lower Leg: Cellular or Tissue Based Product Type: - 05/12/2022 Puraply AM #1 applied. 05/17/22 PURAPLY AM # 2 05/24/2022 Puraply AM #3 05/31/2022 HOLD Edema Control - Lymphedema / SCD / Other: Avoid standing for long periods of time. Moisturize legs daily. - both legs every night before bed. WOUND #1: - Lower Leg Wound Laterality: Left, Lateral Cleanser: Wound Cleanser (Home Health) 1 x Per Day/30 Days Discharge Instructions: Cleanse the wound with wound cleanser prior to applying a clean dressing using gauze sponges, not tissue or cotton balls. BERA, PINELA ([id number]) [id number] Page 8 of 10 Peri-Wound Care: Ketoconazole Cream 2% 1 x Per Day/30 Days Discharge Instructions: Apply to periwound in clinic. you use clobetasol at home. Topical: keystone 1 x Per Day/30 Days Discharge Instructions: Soak collagen in the keystone and pack into wound. Collage may be cut into thin strip. Prim Dressing: Promogran Prisma Matrix, 4.34 (sq in) (silver collagen) (Generic) 1 x Per Day/30 Days ary Discharge Instructions: Moisten collagen with saline or hydrogel Prim Dressing: Plain packing strip 1/4 (in) 1 x Per Day/30 Days ary Discharge Instructions: Lightly pack a small amount behind the collagen. leave a tail out of the wound to remove. Prim Dressing: Compounding topical antibiotics 1 x Per Day/30 Days ary Discharge Instructions: apply antibiotic moisted  gauze lightly packed into wound bed. Secondary Dressing: ABD Pad, 5x9 1 x Per Day/30 Days Discharge Instructions: Apply over primary dressing as directed. Secondary Dressing: Woven Gauze Sponge,  Non-Sterile 4x4 in (Home Health) 1 x Per Day/30 Days Discharge Instructions: Apply over primary dressing as directed. Secured With: [employer], 4.5x3.1 (in/yd) (Home Health) 1 x Per Day/30 Days Discharge Instructions: Secure with Kerlix as directed. Secured With: 33M Medipore H Soft Cloth Surgical T ape, 4 x 10 (in/yd) (Home Health) 1 x Per Day/30 Days Discharge Instructions: Secure with tape as directed. Com pression Wrap: ace wrap (Home Health) 1 x Per Day/30 Days Discharge Instructions: apply to leg and knee. 1. Collagen with antibiotic ointment 2. Ace wrap 3. Follow-up in 1 week Electronic Signature(s) Signed: 08/16/2022 3:18:07 PM By: Geralyn Corwin DO Entered By: Geralyn Corwin on 08/16/2022 11:44:36 -------------------------------------------------------------------------------- HxROS Details Patient Name: Date of Service: [name], [name]. 08/16/2022 10:30 A M Medical Record Number: [id number] Patient Account Number: [id number] Date of Birth/Sex: Treating RN: [birth year] (71 y.o. F) Primary Care Provider: [name] Other Clinician: Referring Provider: Treating Provider/Extender: Annamary Rummage in Treatment: 6 Constitutional Symptoms (General Health) Medical History: Past Medical History Notes: Infarction of spleen Hematologic/Lymphatic Medical History: Past Medical History Notes: Hypothyroidism Cardiovascular Medical History: Positive for: Congestive Heart Failure; Hypertension Past Medical History Notes: A-Fib Gastrointestinal Medical History: Past Medical History Notes: Gastroesophageal reflux Genitourinary Medical History: Past Medical History Notes: Chronic kidney disease HEIDEMARIE, GOODNOW ([id number])  [id number] Page 9 of 10 Musculoskeletal Medical History: Past Medical History Notes: Osteoarthritis of knee Psychiatric Medical History: Past Medical History Notes: Anxiety Immunizations Pneumococcal Vaccine: Received Pneumococcal Vaccination: No Implantable Devices No devices added Hospitalization / Surgery History Type of Hospitalization/Surgery Cellulitis left leg- 01/03/2022-01/07/2022 Electronic Signature(s) Signed: 08/16/2022 3:18:07 PM By: Geralyn Corwin DO Entered By: Geralyn Corwin on 08/16/2022 11:42:35 -------------------------------------------------------------------------------- SuperBill Details Patient Name: Date of Service: [name]. 08/16/2022 Medical Record Number: [id number] Patient Account Number: [id number] Date of Birth/Sex: Treating RN: March 06, 1951 (71 y.o. F) Primary Care Provider: [name] Other Clinician: Referring Provider: Treating Provider/Extender: Annamary Rummage in Treatment: 38 Diagnosis Coding ICD-10 Codes Code Description [phone] Non-pressure chronic ulcer of other part of left lower leg with fat layer exposed T79.8XXA Other early complications of trauma, initial encounter I87.312 Chronic venous hypertension (idiopathic) with ulcer of left lower extremity I89.0 Lymphedema, not elsewhere classified I48.0 Paroxysmal atrial fibrillation Z79.01 Long term (current) use of anticoagulants Facility Procedures : CPT4 Code: [id number] Description: 99213 - WOUND CARE VISIT-LEV 3 EST PT Modifier: Quantity: 1 Physician Procedures Electronic Signature(s) Signed: 08/16/2022 1:12:16 PM By: Shawn Stall RN, BSN Signed: 08/16/2022 3:18:07 PM By: Geralyn Corwin DO Entered By: Shawn Stall on 08/16/2022 11:57:26

## 2022-08-16 NOTE — Progress Notes (Signed)
MAECYN, PANNING ([id number]) [id number] Page 1 of 9 Visit Report for 08/16/2022 Arrival Information Details Patient Name: Date of Service: [name] 08/16/2022 10:30 A M Medical Record Number: [id number] Patient Account Number: [id number] Date of Birth/Sex: Treating RN: 1950-12-12 (71 y.o. F) Primary Care Briarrose Shor: Deland Pretty Other Clinician: Referring Jahzara Slattery: Treating Micole Delehanty/Extender: Judie Grieve in Treatment: 38 Visit Information History Since Last Visit Added or deleted any medications: No Patient Arrived: Walker Any new allergies or adverse reactions: No Arrival Time: 10:43 Had a fall or experienced change in No Accompanied By: self activities of daily living that may affect Transfer Assistance: None risk of falls: Patient Identification Verified: Yes Signs or symptoms of abuse/neglect since last visito No Secondary Verification Process Completed: Yes Hospitalized since last visit: No Patient Requires Transmission-Based Precautions: No Implantable device outside of the clinic excluding No Patient Has Alerts: Yes cellular tissue based products placed in the center Patient Alerts: Patient on Blood Thinner since last visit: Has Dressing in Place as Prescribed: Yes Pain Present Now: No Electronic Signature(s) Signed: 08/16/2022 4:02:42 PM By: Erenest Blank Entered By: Erenest Blank on 08/16/2022 10:45:59 -------------------------------------------------------------------------------- Clinic Level of Care Assessment Details Patient Name: Date of Service: [name] 08/16/2022 10:30 A M Medical Record Number: [id number] Patient Account Number: [id number] Date of Birth/Sex: Treating RN: [birth year] (71 y.o. [name] Primary Care Morgaine Kimball: Deland Pretty Other Clinician: Referring Lurae Hornbrook: Treating Jenie Parish/Extender: Judie Grieve in Treatment: 38 Clinic Level  of Care Assessment Items TOOL 4 Quantity Score X- 1 0 Use when only an EandM is performed on FOLLOW-UP visit ASSESSMENTS - Nursing Assessment / Reassessment X- 1 10 Reassessment of Co-morbidities (includes updates in patient status) X- 1 5 Reassessment of Adherence to Treatment Plan ASSESSMENTS - Wound and Skin A ssessment / Reassessment X - Simple Wound Assessment / Reassessment - one wound 1 5 _0  - 0 Complex Wound Assessment / Reassessment - multiple wounds _1  - 0 Dermatologic / Skin Assessment (not related to wound area) ASSESSMENTS - Focused Assessment X- 1 5 Circumferential Edema Measurements - multi extremities _2  - 0 Nutritional Assessment / Counseling / Intervention STESHA, NEYENS ([id number]) [id number] Page 2 of 9 _3  - 0 Lower Extremity Assessment (monofilament, tuning fork, pulses) _4  - 0 Peripheral Arterial Disease Assessment (using hand held doppler) ASSESSMENTS - Ostomy and/or Continence Assessment and Care _5  - 0 Incontinence Assessment and Management _6  - 0 Ostomy Care Assessment and Management (repouching, etc.) PROCESS - Coordination of Care X - Simple Patient / Family Education for ongoing care 1 15 _7  - 0 Complex (extensive) Patient / Family Education for ongoing care X- 1 10 Staff obtains Programmer, systems, Records, T Results / Process Orders est _8  - 0 Staff telephones HHA, Nursing Homes / Clarify orders / etc _9  - 0 Routine Transfer to another Facility (non-emergent condition) _10  - 0 Routine Hospital Admission (non-emergent condition) _11  - 0 New Admissions / Biomedical engineer / Ordering NPWT Apligraf, etc. , _12  - 0 Emergency Hospital Admission (emergent condition) X- 1 10 Simple Discharge Coordination _13  - 0 Complex (extensive) Discharge Coordination PROCESS - Special Needs _14  - 0 Pediatric / Minor Patient Management _15  - 0 Isolation Patient Management _16  - 0 Hearing / Language / Visual special needs _17  -  0 Assessment of Community assistance (transportation, D/C planning, etc.) _18  - 0 Additional assistance / Altered mentation _19  - 0 Support Surface(s) Assessment (bed, cushion, seat, etc.) INTERVENTIONS - Wound Cleansing /  Measurement X - Simple Wound Cleansing - one wound 1 5 _0  - 0 Complex Wound Cleansing - multiple wounds X- 1 5 Wound Imaging (photographs - any number of wounds) _1  - 0 Wound Tracing (instead of photographs) X- 1 5 Simple Wound Measurement - one wound _2  - 0 Complex Wound Measurement - multiple wounds INTERVENTIONS - Wound Dressings _3  - 0 Small Wound Dressing one or multiple wounds X- 1 15 Medium Wound Dressing one or multiple wounds _4  - 0 Large Wound Dressing one or multiple wounds <ZOXWRUEAVWUJWJXB>_1<\/YNWGNFAOZHYQMVHQ>_4  - 0 Application of Medications - topical <ONGEXBMWUXLKGMWN>_0<\/UVOZDGUYQIHKVQQV>_9  - 0 Application of Medications - injection INTERVENTIONS - Miscellaneous _7  - 0 External ear exam _8  - 0 Specimen Collection (cultures, biopsies, blood, body fluids, etc.) _9  - 0 Specimen(s) / Culture(s) sent or taken to Lab for analysis _10  - 0 Patient Transfer (multiple staff / Civil Service fast streamer / Similar devices) _11  - 0 Simple Staple / Suture removal (25 or less) _12  - 0 Complex Staple / Suture removal (26 or more) _13  - 0 Hypo / Hyperglycemic Management (close monitor of Blood Glucose) ADELIS, DOCTER ([id number]) [id number] Page 3 of 9 _14  - 0 Ankle / Brachial Index (ABI) - do not check if billed separately X- 1 5 Vital Signs Has the patient been seen at the hospital within the last three years: Yes Total Score: 95 Level Of Care: New/Established - Level 3 Electronic Signature(s) Signed: 08/16/2022 1:12:16 PM By: Deon Pilling RN, BSN Entered By: Deon Pilling on 08/16/2022 11:57:21 -------------------------------------------------------------------------------- Encounter Discharge Information Details Patient Name: Date of Service: [name] 08/16/2022 10:30 A M Medical Record  Number: [id number] Patient Account Number: [id number] Date of Birth/Sex: Treating RN: 08/27/1950 (71 y.o. [name] Primary Care Dalinda Heidt: Deland Pretty Other Clinician: Referring Sebron Mcmahill: Treating Roy Tokarz/Extender: Judie Grieve in Treatment: 22 Encounter Discharge Information Items Discharge Condition: Stable Ambulatory Status: Ambulatory Discharge Destination: Home Transportation: Private Auto Accompanied By: self Schedule Follow-up Appointment: Yes Clinical Summary of Care: Electronic Signature(s) Signed: 08/16/2022 1:12:16 PM By: Deon Pilling RN, BSN Entered By: Deon Pilling on 08/16/2022 11:57:45 -------------------------------------------------------------------------------- Lower Extremity Assessment Details Patient Name: Date of Service: [name] 08/16/2022 10:30 A M Medical Record Number: [id number] Patient Account Number: [id number] Date of Birth/Sex: Treating RN: 1951-02-09 (71 y.o. F) Primary Care Jhoel Stieg: Deland Pretty Other Clinician: Referring Marvalene Barrett: Treating Cameshia Cressman/Extender: Judie Grieve in Treatment: 38 Edema Assessment Assessed: [Left: No] [Right: No] Edema: [Left: Ye] [Right: s] Calf Left: Right: Point of Measurement: 31 cm From Medial Instep 47 cm Ankle Left: Right: Point of Measurement: 9 cm From Medial Instep 27.5 cm Electronic Signature(s) Signed: 08/16/2022 4:02:42 PM By: Leonette Most ([id number]) By: Golden Hurter.pdf Page 4 of 9 Signed: 08/16/2022 4:02:42 PM Entered By: Erenest Blank on 08/16/2022 10:52:10 -------------------------------------------------------------------------------- Multi Wound Chart Details Patient Name: Date of Service: [name] 08/16/2022 10:30 A M Medical Record Number: [id number] Patient Account Number: [id number] Date of Birth/Sex: Treating RN: Sep 07, 1950 (71 y.o. F) Primary  Care Dontel Harshberger: Deland Pretty Other Clinician: Referring Olukemi Panchal: Treating Anakin Varkey/Extender: Judie Grieve in Treatment: 38 Vital Signs Height(in): 67 Pulse(bpm): 65 Weight(lbs): 340 Blood Pressure(mmHg): 166/70 Body Mass Index(BMI): 53.2 Temperature(F): 98 Respiratory Rate(breaths/min): 18 [1:Photos:] [N/A:N/A] Left, Lateral Lower Leg N/A N/A Wound Location: Trauma N/A N/A Wounding Event: Trauma, Other N/A N/A Primary Etiology: Congestive Heart Failure, N/A N/A Comorbid History: Hypertension 10/18/2021 N/A N/A Date Acquired: 69 N/A N/A Weeks  of Treatment: Open N/A N/A Wound Status: No N/A N/A Wound Recurrence: Yes N/A N/A Clustered Wound: 1 N/A N/A Clustered Quantity: 0.2x0.3x0.3 N/A N/A Measurements L x W x D (cm) 0.047 N/A N/A A (cm) : rea 0.014 N/A N/A Volume (cm) : 100.00% N/A N/A % Reduction in A rea: 100.00% N/A N/A % Reduction in Volume: 10 Position 1 (o'clock): 2 Maximum Distance 1 (cm): Yes N/A N/A Tunneling: Full Thickness With Exposed Support N/A N/A Classification: Structures Medium N/A N/A Exudate Amount: Serosanguineous N/A N/A Exudate Type: red, brown N/A N/A Exudate Color: Distinct, outline attached N/A N/A Wound Margin: Large (67-100%) N/A N/A Granulation Amount: Red N/A N/A Granulation Quality: None Present (0%) N/A N/A Necrotic Amount: Fat Layer (Subcutaneous Tissue): Yes N/A N/A Exposed Structures: Fascia: No Tendon: No Muscle: No Joint: No Bone: No Large (67-100%) N/A N/A Epithelialization: Induration: Yes N/A N/A Periwound Skin Texture: Excoriation: No Callus: No Crepitus: No Rash: No Scarring: No Maceration: No N/A N/A Periwound Skin Moisture: Dry/Scaly: No KASARAH, SITTS ([id number]) [id number] Page 5 of 9 Hemosiderin Staining: Yes N/A N/A Periwound Skin Color: Atrophie Blanche: No Cyanosis: No Ecchymosis: No Erythema: No Mottled:  No Pallor: No Rubor: No No Abnormality N/A N/A Temperature: Yes N/A N/A Tenderness on Palpation: Treatment Notes Electronic Signature(s) Signed: 08/16/2022 3:18:07 PM By: Kalman Shan DO Entered By: Kalman Shan on 08/16/2022 11:41:54 -------------------------------------------------------------------------------- Multi-Disciplinary Care Plan Details Patient Name: Date of Service: [name] 08/16/2022 10:30 A M Medical Record Number: [id number] Patient Account Number: [id number] Date of Birth/Sex: Treating RN: 02/18/1951 (71 y.o. [name], Meta.Reding Primary Care Patina Spanier: Deland Pretty Other Clinician: Referring Valla Pacey: Treating Sherin Murdoch/Extender: Judie Grieve in Treatment: 17 Active Inactive Abuse / Safety / Falls / Self Care Management Nursing Diagnoses: History of Falls Potential for falls Goals: Patient/caregiver will verbalize/demonstrate measure taken to improve self care Date Initiated: 11/19/2021 Date Inactivated: 04/05/2022 Target Resolution Date: 04/21/2022 Goal Status: Met Patient/caregiver will verbalize/demonstrate measures taken to prevent injury and/or falls Date Initiated: 11/19/2021 Target Resolution Date: 09/22/2022 Goal Status: Active Interventions: Provide education on basic hygiene Provide education on fall prevention Provide education on HBO safety Provide education on vaccinations Treatment Activities: Education provided on Basic Hygiene : 05/17/2022 Notes: Pain, Acute or Chronic Nursing Diagnoses: Pain, acute or chronic: actual or potential Potential alteration in comfort, pain Goals: Patient will verbalize adequate pain control and receive pain control interventions during procedures as needed Date Initiated: 11/19/2021 Date Inactivated: 04/05/2022 Target Resolution Date: 04/22/2022 Goal Status: Met Patient/caregiver will verbalize comfort level met Date Initiated: 11/19/2021 Target Resolution Date:  09/15/2022 Goal Status: Active LILLER, YOHN ([id number]) [phone].pdf Page 6 of 9 Interventions: Encourage patient to take pain medications as prescribed Provide education on pain management Reposition patient for comfort Treatment Activities: Administer pain control measures as ordered : 11/19/2021 Notes: Electronic Signature(s) Signed: 08/16/2022 1:12:16 PM By: Deon Pilling RN, BSN Entered By: Deon Pilling on 08/16/2022 11:05:17 -------------------------------------------------------------------------------- Pain Assessment Details Patient Name: Date of Service: [name] 08/16/2022 10:30 A M Medical Record Number: [id number] Patient Account Number: [id number] Date of Birth/Sex: Treating RN: 03-27-1951 (71 y.o. F) Primary Care Valiant Dills: Deland Pretty Other Clinician: Referring Kaliyah Gladman: Treating Aiyanah Kalama/Extender: Judie Grieve in Treatment: 38 Active Problems Location of Pain Severity and Description of Pain Patient Has Paino No Site Locations Pain Management and Medication Current Pain Management: Electronic Signature(s) Signed: 08/16/2022 4:02:42 PM By: Erenest Blank Entered By: Erenest Blank on 08/16/2022 10:46:25 -------------------------------------------------------------------------------- Patient/Caregiver  Education Details Patient Name: Date of Service: [name] 12/26/2023andnbsp10:30 A M Medical Record Number: [id number] Patient Account Number: [id number] MILIKA, VENTRESS ([id number]) [id number] Page 7 of 9 Date of Birth/Gender: Treating RN: 1951/07/21 (71 y.o. [name] Primary Care Physician: Deland Pretty Other Clinician: Referring Physician: Treating Physician/Extender: Judie Grieve in Treatment: 7 Education Assessment Education Provided To: Patient Education Topics Provided Wound/Skin Impairment: Handouts:  Caring for Your Ulcer Methods: Explain/Verbal Responses: Reinforcements needed Electronic Signature(s) Signed: 08/16/2022 1:12:16 PM By: Deon Pilling RN, BSN Entered By: Deon Pilling on 08/16/2022 11:05:29 -------------------------------------------------------------------------------- Wound Assessment Details Patient Name: Date of Service: [name] 08/16/2022 10:30 A M Medical Record Number: [id number] Patient Account Number: [id number] Date of Birth/Sex: Treating RN: [birth year] (71 y.o. F) Primary Care Ziaire Bieser: Deland Pretty Other Clinician: Referring Victoriano Campion: Treating Deliliah Spranger/Extender: Judie Grieve in Treatment: 38 Wound Status Wound Number: 1 Primary Etiology: Trauma, Other Wound Location: Left, Lateral Lower Leg Wound Status: Open Wounding Event: Trauma Comorbid History: Congestive Heart Failure, Hypertension Date Acquired: 10/18/2021 Weeks Of Treatment: 38 Clustered Wound: Yes Photos Wound Measurements Length: (cm) Width: (cm) Depth: (cm) Clustered Quantity: Area: (cm) Volume: (cm) 0.2 % Reduction in Area: 100% 0.3 % Reduction in Volume: 100% 0.3 Epithelialization: Large (67-100%) 1 Tunneling: Yes 0.047 Position (o'clock): 10 0.014 Maximum Distance: (cm) 2 Undermining: No Wound Description Classification: Full Thickness With Exposed Support Structures EBANY, BOWERMASTER ([id number]) Wound Margin: Distinct, outline attached Exudate Amount: Medium Exudate Type: Serosanguineous Exudate Color: red, brown Foul Odor After Cleansing: No [id number] Page 8 of 9 Slough/Fibrino No Wound Bed Granulation Amount: Large (67-100%) Exposed Structure Granulation Quality: Red Fascia Exposed: No Necrotic Amount: None Present (0%) Fat Layer (Subcutaneous Tissue) Exposed: Yes Tendon Exposed: No Muscle Exposed: No Joint Exposed: No Bone Exposed: No Periwound Skin Texture Texture Color No Abnormalities  Noted: No No Abnormalities Noted: No Callus: No Atrophie Blanche: No Crepitus: No Cyanosis: No Excoriation: No Ecchymosis: No Induration: Yes Erythema: No Rash: No Hemosiderin Staining: Yes Scarring: No Mottled: No Pallor: No Moisture Rubor: No No Abnormalities Noted: No Dry / Scaly: No Temperature / Pain Maceration: No Temperature: No Abnormality Tenderness on Palpation: Yes Treatment Notes Wound #1 (Lower Leg) Wound Laterality: Left, Lateral Cleanser Wound Cleanser Discharge Instruction: Cleanse the wound with wound cleanser prior to applying a clean dressing using gauze sponges, not tissue or cotton balls. Peri-Wound Care Ketoconazole Cream 2% Discharge Instruction: Apply to periwound in clinic. you use clobetasol at home. Topical keystone Discharge Instruction: Soak collagen in the [place] and pack into wound. Collage may be cut into thin strip. Primary Dressing Promogran Prisma Matrix, 4.34 (sq in) (silver collagen) Discharge Instruction: Moisten collagen with saline or hydrogel Plain packing strip 1/4 (in) Discharge Instruction: Lightly pack a small amount behind the collagen. leave a tail out of the wound to remove. Compounding topical antibiotics Discharge Instruction: apply antibiotic moisted gauze lightly packed into wound bed. Secondary Dressing ABD Pad, 5x9 Discharge Instruction: Apply over primary dressing as directed. Woven Gauze Sponge, Non-Sterile 4x4 in Discharge Instruction: Apply over primary dressing as directed. Secured With [employer], 4.5x3.1 (in/yd) Discharge Instruction: Secure with Kerlix as directed. 71M Medipore H Soft Cloth Surgical T ape, 4 x 10 (in/yd) Discharge Instruction: Secure with tape as directed. Compression Wrap ace wrap Discharge Instruction: apply to leg and knee. Compression Stockings Add-Ons AADHIRA, HEFFERNAN ([id number]) [id number] Page 9 of 9 Electronic Signature(s) Signed:  08/16/2022 1:12:16 PM By: Rolin Barry,  Tammi Klippel RN, BSN Entered By: Deon Pilling on 08/16/2022 11:03:20 -------------------------------------------------------------------------------- Vitals Details Patient Name: Date of Service: [name] 08/16/2022 10:30 A M Medical Record Number: [id number] Patient Account Number: [id number] Date of Birth/Sex: Treating RN: [birth year] (71 y.o. F) Primary Care Shaheem Pichon: Deland Pretty Other Clinician: Referring Kaio Kuhlman: Treating Nahara Dona/Extender: Judie Grieve in Treatment: 38 Vital Signs Time Taken: 10:46 Temperature (F): 98 Height (in): 67 Pulse (bpm): 62 Weight (lbs): 340 Respiratory Rate (breaths/min): 18 Body Mass Index (BMI): 53.2 Blood Pressure (mmHg): 166/70 Reference Range: 80 - 120 mg / dl Electronic Signature(s) Signed: 08/16/2022 4:02:42 PM By: Erenest Blank Entered By: Erenest Blank on 08/16/2022 10:46:19

## 2022-08-19 DIAGNOSIS — Z96653 Presence of artificial knee joint, bilateral: Secondary | ICD-10-CM | POA: Diagnosis not present

## 2022-08-19 DIAGNOSIS — Z6841 Body Mass Index (BMI) 40.0 and over, adult: Secondary | ICD-10-CM | POA: Diagnosis not present

## 2022-08-19 DIAGNOSIS — I110 Hypertensive heart disease with heart failure: Secondary | ICD-10-CM | POA: Diagnosis not present

## 2022-08-19 DIAGNOSIS — I5032 Chronic diastolic (congestive) heart failure: Secondary | ICD-10-CM | POA: Diagnosis not present

## 2022-08-19 DIAGNOSIS — T24332D Burn of third degree of left lower leg, subsequent encounter: Secondary | ICD-10-CM | POA: Diagnosis not present

## 2022-08-19 DIAGNOSIS — J309 Allergic rhinitis, unspecified: Secondary | ICD-10-CM | POA: Diagnosis not present

## 2022-08-19 DIAGNOSIS — Z9181 History of falling: Secondary | ICD-10-CM | POA: Diagnosis not present

## 2022-08-19 DIAGNOSIS — Z7901 Long term (current) use of anticoagulants: Secondary | ICD-10-CM | POA: Diagnosis not present

## 2022-08-19 DIAGNOSIS — E039 Hypothyroidism, unspecified: Secondary | ICD-10-CM | POA: Diagnosis not present

## 2022-08-19 DIAGNOSIS — Z9049 Acquired absence of other specified parts of digestive tract: Secondary | ICD-10-CM | POA: Diagnosis not present

## 2022-08-19 DIAGNOSIS — I4891 Unspecified atrial fibrillation: Secondary | ICD-10-CM | POA: Diagnosis not present

## 2022-08-19 DIAGNOSIS — D500 Iron deficiency anemia secondary to blood loss (chronic): Secondary | ICD-10-CM | POA: Diagnosis not present

## 2022-08-19 DIAGNOSIS — F325 Major depressive disorder, single episode, in full remission: Secondary | ICD-10-CM | POA: Diagnosis not present

## 2022-08-25 ENCOUNTER — Encounter (HOSPITAL_BASED_OUTPATIENT_CLINIC_OR_DEPARTMENT_OTHER): Payer: PPO | Attending: Internal Medicine | Admitting: Internal Medicine

## 2022-08-25 ENCOUNTER — Encounter (HOSPITAL_BASED_OUTPATIENT_CLINIC_OR_DEPARTMENT_OTHER): Payer: PPO | Admitting: Internal Medicine

## 2022-08-25 DIAGNOSIS — Z7901 Long term (current) use of anticoagulants: Secondary | ICD-10-CM | POA: Insufficient documentation

## 2022-08-25 DIAGNOSIS — L97822 Non-pressure chronic ulcer of other part of left lower leg with fat layer exposed: Secondary | ICD-10-CM | POA: Diagnosis not present

## 2022-08-25 DIAGNOSIS — I890 Lymphedema, not elsewhere classified: Secondary | ICD-10-CM | POA: Insufficient documentation

## 2022-08-25 DIAGNOSIS — T798XXA Other early complications of trauma, initial encounter: Secondary | ICD-10-CM

## 2022-08-25 DIAGNOSIS — I130 Hypertensive heart and chronic kidney disease with heart failure and stage 1 through stage 4 chronic kidney disease, or unspecified chronic kidney disease: Secondary | ICD-10-CM | POA: Diagnosis not present

## 2022-08-25 DIAGNOSIS — N189 Chronic kidney disease, unspecified: Secondary | ICD-10-CM | POA: Diagnosis not present

## 2022-08-25 DIAGNOSIS — W19XXXA Unspecified fall, initial encounter: Secondary | ICD-10-CM | POA: Diagnosis not present

## 2022-08-25 DIAGNOSIS — I87312 Chronic venous hypertension (idiopathic) with ulcer of left lower extremity: Secondary | ICD-10-CM | POA: Diagnosis not present

## 2022-08-25 DIAGNOSIS — I5032 Chronic diastolic (congestive) heart failure: Secondary | ICD-10-CM | POA: Diagnosis not present

## 2022-08-25 DIAGNOSIS — I872 Venous insufficiency (chronic) (peripheral): Secondary | ICD-10-CM | POA: Diagnosis not present

## 2022-08-25 DIAGNOSIS — I480 Paroxysmal atrial fibrillation: Secondary | ICD-10-CM | POA: Insufficient documentation

## 2022-08-25 DIAGNOSIS — E039 Hypothyroidism, unspecified: Secondary | ICD-10-CM | POA: Insufficient documentation

## 2022-08-25 NOTE — Progress Notes (Addendum)
[name], [name] ([id number]) [id number] Page 1 of 10 Visit Report for 08/25/2022 Chief Complaint Document Details Patient Name: Date of Service: [name] 08/25/2022 2:15 PM Medical Record Number: [id number] Patient Account Number: [id number] Date of Birth/Sex: Treating RN: [birth year] (72 y.o. F) Primary [name] Provider: [name] Other Clinician: Referring Provider: Treating Provider/Extender: Annamary Rummage in Treatment: 78 Information Obtained from: Patient Chief Complaint 11/19/2021; Left lower extremity Christine status post fall Electronic Signature(s) Signed: 08/25/2022 2:54:58 PM By: Geralyn Corwin DO Entered By: Geralyn Corwin on 08/25/2022 14:38:19 -------------------------------------------------------------------------------- HPI Details Patient Name: Date of Service: [name], [name]. 08/25/2022 2:15 PM Medical Record Number: [id number] Patient Account Number: [id number] Date of Birth/Sex: Treating RN: May 15, 1951 (72 y.o. F) Primary [name] Provider: [name] Other Clinician: Referring Provider: Treating Provider/Extender: Annamary Rummage in Treatment: 15 History of Present Illness HPI Description: Admission 11/19/2021 [name] is a 72 year old female with a past medical history of paroxysmal A-fib on Eliquis, hypothyroidism, major depressive disorder, venous insufficiency and chronic diastolic heart failure that presents to the clinic for a 1 month history of nonhealing Christine to the left lower extremity. She visited the ED on 10/18/2021 after a mechanical fall. She developed a hematoma that subsequently opened. She was hospitalized for 7 days and discharged on 10/25/2021. She has been on several different antibiotics For the past month. She states that most recently she was on Levaquin and linezolid for the past week. She states she completes her antibiotic course tomorrow. She  has been using Dakin's wet-to-dry dressings to the Christine bed. She denies signs of infection. 4/7; patient presents for follow-up. She has been using Dakin's wet-to-dry dressings. She did end up going to the ED on 4/1 because she had excess bleeding with dressing change that she could not stop. She is on Eliquis for A-fib. In the ED they tied off a small artery. She has had no issues since discharge. She denies signs of infection. 4/14; this is a very difficult clinical situation. A patient with underlying chronic venous insufficiency and lymphedema very significant lower extremity edema had a hematoma after a fall on her left upper lateral lower leg. She is on Eliquis for atrial fibrillation apparently with a history of a splenic infarct following with Dr. Berton Mount of cardiology. She has exhibited significant bleeding from the Christine surface including ao Venous bleeder that required suturing short while ago. She has been using Dakin's wet-to-dry packing and over the surface of the Christine. She saw Dr. Graciela Husbands yesterday he is reluctant to consider stopping the Eliquis because of the prior history of presumed cardioembolism. Wants to communicate with Dr. Mikey Bussing when she returns. In a perfect world where she was not on Eliquis she requires a Christine VAC with additional compression wraps but I understand the reluctance to do this because of the concerns of bleeding 4/28; the patient's Christine actually looks better today using Dakin's wet-to-dry that she is changing twice a day she is wrapping this with Kerlix and Ace wrapping. She tells me she had 2 small bleeding areas which were part of the superficial Christine that stopped this week with direct pressure. Other than that no major issues. In follow-up from discussion of last week Dr. 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 The bigger  question from my point of view is the Christine VAC issue. As far as she knows and her daughter-in-law verifies that she has not had any bleeding from the deeper parts of the Christine although the bleeding has been superficial including the one that sent her to the ER for stitches. She is concerned that a Christine VAC would cause further bleeding and I cannot completely allay those concerns. [name], [name] ([id number]) [id number] Page 2 of 10 5/8; patient presents for follow-up. She has no issues or complaints today. She has been using Dakin's wet-to-dry dressings without issues. She denies signs of infection. 5/12; patient presents for follow-up. She continues to use Dakin's wet-to-dry dressings without issues. She denies signs of infection. She reports some issues with bleeding at times but this has improved. She denies signs of infection. 6/1; patient presents for follow-up. She was recently hospitalized for upper left leg thigh cellulitis. She was given IV cefepime and vancomycin and discharged on oral antibiotics. She has been using Dakin's wet-to-dry dressings to the left lower leg Christine. She reports improvement in healing. She currently denies systemic signs of infection. 6/7; patient is using Dakin's wet-to-dry twice daily. In general this looks better than when I saw this a month or so ago however still considerable depth to the tunnel in her left leg. She is going for iron infusions ordered by her primary [name] doctor I believe 6/15; patient presents for follow-up. She has been using Dakin's wet-to-dry dressings. She has noted some scattered small areas that blister up and heal on her left lower leg. She has been using mupirocin ointment on them. Nothing open today. 6/23; patient's been using Dakin's wet-to-dry dressings to the tunneled Christine and Hydrofera Blue to the opening. She has no issues or complaints today. 6/29; patient  presents for follow-up. She has been using Dakin's wet-to-dry dressings to the tunneled Christine and Hydrofera Blue to the opening. She [facility] sticking to the Christine bed. She denies signs of infection. 7/7; patient presents for follow-up. She has been using Dakin's wet-to-dry dressings to the tunneled Christine and PolyMem silver to the opening without issues. 7/13; patient presents for follow-up. She has been using PolyMem silver to the opening and the rope to the tunnel. She has no issues or complaints today. She denies signs of infection. 7/20; patient presents for follow-up. We have been using PolyMem silver to the Christine bed. She has no issues or complaints today. She is receiving her custom compression garments tomorrow. 7/27; patient presents for follow-up. She has been using PolyMem silver to the Christine bed. She is having her custom compression garments adjusted as these are not staying on very easily. 8/30; patient presents for follow-up. We have been using PolyMem silver to the Christine bed. She is still waiting on her custom compression garments to be ordered. She denies signs of infection. 8/11; patient presents for follow-up. The Christine VAC has been started and patient has been using this for the past week. Patient has home health who changes the Christine VAC. She developed irritation to the periwound. DuoDERM was not being used to the periwound despite being ordered. 8/15; patient presents for follow-up. She restarted the Christine VAC and has had improvement to the periwound with the use of DuoDERM. 8/24; patient presents for follow-up. She has been using the Christine VAC. She has developed a Christine just superior to the original Christine. Likely as a result from the Christine VAC. She denies signs of infection. 8/29; patient presents  for follow-up. We have been using collagen to the Christine bed. She has held off on the Christine VAC for the past week. 9/7; patient presents for follow-up. We have  been using iodoform packing to the Christine bed. She has mild tenderness to the periwound. She denies systemic signs of infection. 9/12; patient presents for follow-up. Patient has been using Dakin's wet-to-dry dressings. She has no issues or complaints today. She denies signs of infection. 9/21; patient presents for follow-up. PuraPly #1 was placed in standard fashion at last clinic visit. She has no issues or complaints today. She denies signs of infection. 10/3; patient presents for follow-up. PuraPly #2 was placed in standard fashion at last clinic visit. She has no issues or complaints today. She denies signs of infection. 10/10; patient presents for follow-up. PuraPly #3 was placed in standard fashion at last clinic visit. She has no issues or complaints today. She denies signs of infection. 10/17; patient presents for follow-up. She has been using silver alginate to the tunneled Christine. She has no issues or complaints today. 10/31; patient presents for follow-up. We have been doing Dakin's wet-to-dry dressings to the tunneled Christine. The Christine VAC has been ordered however patient has not received this. She currently denies signs of infection. She reports a lot of serosanguineous drainage. 11/14; patient presents for follow-up. She had a PCR culture done at last clinic visit that grew Pseudomonas aeruginosa. She obtained Keystone antibiotic spray yesterday. She also obtained her ultrasound of the left lower extremity to assess for abscess however the vascular lab ordered On erroneous test. This has been rectified. Patient was not charged for the test. DVT study showed no DVT T also reported to our nurse that they did not notice any abscess s. [name] while doing the DVT rule out. 11/21; patient presents for follow-up. She has been using Keystone antibiotic with gauze packing. She has no issues or complaints today. 11/30; patient presents for follow-up. She has been using Keystone antibiotic with gauze  packing. Depth is slightly larger today. She denies signs of infection. 12/5; patient presents for follow-up. She has been using Keystone antibiotic spray with collagen. She has no issues or complaints today. She has been using an Ace wrap for compression therapy. 12/12; patient presents for follow-up. She has been using Keystone antibiotic spray with collagen and Ace wrap for compression. She states that the Ace wrap falls down during the day. She is only able to use this at night. 12/19; patient presents for follow-up. She has been using Keystone antibiotic spray with collagen. She is unable to use an Ace wrap during the day because she states it falls down however she is able to use it at night without issues. She denies signs of infection. 12/26; patient presents for follow-up. She has been using Keystone antibiotic ointment with collagen. She uses an Ace wrap to help with compression at night. She has no issues or complaints today. 1/4; patient presents for follow-up. She is been using Keystone antibiotic with collagen. She uses the Ace wrap at night. She tries to use it in the daytime as well she has no issues or complaints today. Electronic Signature(s) Signed: 08/25/2022 2:54:58 PM By: Pablo Ledger ([id number]) [id number] Page 3 of 10 Entered By: Geralyn Corwin on 08/25/2022 14:39:14 -------------------------------------------------------------------------------- Physical Exam Details Patient Name: Date of Service: [name] 08/25/2022 2:15 PM Medical Record Number: [id number] Patient Account Number: [id number] Date of Birth/Sex: Treating RN: 03-18-1951 (72 y.o. F) Primary  [name] Provider: [name] Other Clinician: Referring Provider: Treating Provider/Extender: Annamary Rummage in Treatment: 39 Constitutional respirations regular, non-labored and within target range for  patient.. Cardiovascular 2+ dorsalis pedis/posterior tibialis pulses. Psychiatric pleasant and cooperative. Notes Left lower extremity: Open Christine with granulation tissue at the opening with a depth of 1.8 cm no increased warmth, erythema or purulent drainage. No tenderness on palpation. Uncontrolled lymphedema above the knee. Electronic Signature(s) Signed: 08/25/2022 2:54:58 PM By: Geralyn Corwin DO Entered By: Geralyn Corwin on 08/25/2022 14:43:09 -------------------------------------------------------------------------------- Physician Orders Details Patient Name: Date of Service: [name], Christine Curls LYN [name]. 08/25/2022 2:15 PM Medical Record Number: [id number] Patient Account Number: [id number] Date of Birth/Sex: Treating RN: 1951-02-10 (72 y.o. Ardis Rowan, [name] Primary [name] Provider: [name] Other Clinician: Referring Provider: Treating Provider/Extender: Annamary Rummage in Treatment: 3 Verbal / Phone Orders: No Diagnosis Coding Follow-up Appointments ppointment in 1 week. - Dr. Mikey Bussing next Thursday 09/01/22 @ 1030 Return A ppointment in 2 weeks. - Dr. Mikey Bussing Return A Other: - Continue Topical antibiotic ointment. use clobetasol antifungal at home around the Christine. Anesthetic (In clinic) Topical Lidocaine 5% applied to Christine bed Cellular or Tissue Based Products Christine #1 Left,Lateral Lower Leg Cellular or Tissue Based Product Type: - 05/12/2022 Puraply AM #1 applied. 05/17/22 PURAPLY AM # 2 05/24/2022 Puraply AM #3 05/31/2022 HOLD Edema Control - Lymphedema / SCD / Other Avoid standing for long periods of time. Moisturize legs daily. - both legs every night before bed. [name], [name] ([id number]) [id number] Page 4 of 10 Christine Treatment Christine #1 - Lower Leg Christine Laterality: Left, Lateral Cleanser: Christine Cleanser (Home Health) 1 x Per Day/30 Days Discharge Instructions: Cleanse the Christine with Christine cleanser  prior to applying a clean dressing using gauze sponges, not tissue or cotton balls. Peri-[name]: Ketoconazole Cream 2% 1 x Per Day/30 Days Discharge Instructions: Apply to periwound in clinic. you use clobetasol at home. Topical: keystone 1 x Per Day/30 Days Discharge Instructions: Soak collagen in the keystone and pack into Christine. Collage may be cut into thin strip. Prim Dressing: Promogran Prisma Matrix, 4.34 (sq in) (silver collagen) (Generic) 1 x Per Day/30 Days ary Discharge Instructions: Moisten collagen with saline or hydrogel Prim Dressing: Plain packing strip 1/4 (in) 1 x Per Day/30 Days ary Discharge Instructions: Lightly pack a small amount behind the collagen. leave a tail out of the Christine to remove. Prim Dressing: Compounding topical antibiotics 1 x Per Day/30 Days ary Discharge Instructions: apply antibiotic moisted gauze lightly packed into Christine bed. Secondary Dressing: ABD Pad, 5x9 1 x Per Day/30 Days Discharge Instructions: Apply over primary dressing as directed. Secondary Dressing: Woven Gauze Sponge, Non-Sterile 4x4 in (Home Health) 1 x Per Day/30 Days Discharge Instructions: Apply over primary dressing as directed. Secured With: [employer], 4.5x3.1 (in/yd) (Home Health) 1 x Per Day/30 Days Discharge Instructions: Secure with Kerlix as directed. Secured With: 40M Medipore H Soft Cloth Surgical T ape, 4 x 10 (in/yd) (Home Health) 1 x Per Day/30 Days Discharge Instructions: Secure with tape as directed. Compression Wrap: ace wrap (Home Health) 1 x Per Day/30 Days Discharge Instructions: apply to leg and knee. Electronic Signature(s) Signed: 08/25/2022 2:54:58 PM By: Geralyn Corwin DO Entered By: Geralyn Corwin on 08/25/2022 14:40:08 -------------------------------------------------------------------------------- Problem List Details Patient Name: Date of Service: [name], Christine Curls LYN [name]. 08/25/2022 2:15 PM Medical Record Number: [id number] Patient Account  Number: [id number] Date of Birth/Sex: Treating RN: 14-Feb-1951 (72 y.o. 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/31/2023 No Yes T79.8XXA Other early complications of trauma, initial encounter 11/19/2021 No Yes I87.312 Chronic venous hypertension (idiopathic) with ulcer of left lower extremity 04/28/2022 No Yes [name], [name] ([id number]) [phone].pdf Page 5 of 10 I89.0 Lymphedema, not elsewhere classified 04/28/2022 No Yes I48.0 Paroxysmal atrial fibrillation 11/19/2021 No Yes Z79.01 Long term (current) use of anticoagulants 11/19/2021 No Yes Inactive Problems Resolved Problems Electronic Signature(s) Signed: 08/25/2022 2:54:58 PM By: Kalman Shan DO Entered By: Kalman Shan on 08/25/2022 14:38:00 -------------------------------------------------------------------------------- Progress Note Details Patient Name: Date of Service: Christine Simmonds [name]. 08/25/2022 2:15 PM Medical Record Number: [id number] Patient Account Number: [id number] Date of Birth/Sex: Treating RN: 1951/06/25 (72 y.o. F) Primary [name] Provider: Deland Pretty Other Clinician: Referring Provider: Treating Provider/Extender: Judie Grieve in Treatment: 39 Subjective Chief Complaint Information obtained from Patient 11/19/2021; Left lower extremity Christine status post fall History of Present Illness (HPI) Admission 11/19/2021 Ms. Frenchie Pribyl is a 72 year old female with a past medical history of paroxysmal A-fib on Eliquis, hypothyroidism, major depressive disorder, venous insufficiency and chronic diastolic heart failure that presents to the clinic for a 1 month history of  nonhealing Christine to the left lower extremity. She visited the ED on 10/18/2021 after a mechanical fall. She developed a hematoma that subsequently opened. She was hospitalized for 7 days and discharged on 10/25/2021. She has been on several different antibiotics For the past month. She states that most recently she was on Levaquin and linezolid for the past week. She states she completes her antibiotic course tomorrow. She has been using Dakin's wet-to-dry dressings to the Christine bed. She denies signs of infection. 4/7; patient presents for follow-up. She has been using Dakin's wet-to-dry dressings. She did end up going to the ED on 4/1 because she had excess bleeding with dressing change that she could not stop. She is on Eliquis for A-fib. In the ED they tied off a small artery. She has had no issues since discharge. She denies signs of infection. 4/14; this is a very difficult clinical situation. A patient with underlying chronic venous insufficiency and lymphedema very significant lower extremity edema had a hematoma after a fall on her left upper lateral lower leg. She is on Eliquis for atrial fibrillation apparently with a history of a splenic infarct following with Dr. Jolyn Nap of cardiology. She has exhibited significant bleeding from the Christine surface including ao Venous bleeder that required suturing short while ago. She has been using Dakin's wet-to-dry packing and over the surface of the Christine. She saw Dr. Caryl Comes yesterday he is reluctant to consider stopping the Eliquis because of the prior history of presumed cardioembolism. Wants to communicate with Dr. Heber Asotin when she returns. In a perfect world where she was not on Eliquis she requires a Christine VAC with additional compression wraps but I understand the reluctance to do this because of the concerns of bleeding 4/28; the patient's Christine actually looks better today using Dakin's wet-to-dry that she is changing twice a day she is wrapping  this with Kerlix and Ace wrapping. She tells me she had 2 small bleeding areas which were part of the superficial Christine that stopped this week with direct pressure. Other than that no major issues. In follow-up from discussion of  last week Dr. 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 The bigger question from my point of view is the Christine VAC issue. As far as she knows and her daughter-in-law verifies that she has not had any bleeding from the deeper parts of the Christine although the bleeding has been superficial including the one that sent her to the ER for stitches. She is concerned that a Christine VAC would cause further bleeding and I cannot completely allay those concerns. 5/8; patient presents for follow-up. She has no issues or complaints today. She has been using Dakin's wet-to-dry dressings without issues. She denies signs of infection. 5/12; patient presents for follow-up. She continues to use Dakin's wet-to-dry dressings without issues. She denies signs of infection. She reports some issues with bleeding at times but this has improved. She denies signs of infection. [name], [name] ([id number]) [id number] Page 6 of 10 6/1; patient presents for follow-up. She was recently hospitalized for upper left leg thigh cellulitis. She was given IV cefepime and vancomycin and discharged on oral antibiotics. She has been using Dakin's wet-to-dry dressings to the left lower leg Christine. She reports improvement in healing. She currently denies systemic signs of infection. 6/7; patient is using Dakin's wet-to-dry twice daily. In general this looks better than when I saw this a month or so ago however still considerable depth to the tunnel in her left leg. She is going for iron infusions ordered by her primary [name]  doctor I believe 6/15; patient presents for follow-up. She has been using Dakin's wet-to-dry dressings. She has noted some scattered small areas that blister up and heal on her left lower leg. She has been using mupirocin ointment on them. Nothing open today. 6/23; patient's been using Dakin's wet-to-dry dressings to the tunneled Christine and Hydrofera Blue to the opening. She has no issues or complaints today. 6/29; patient presents for follow-up. She has been using Dakin's wet-to-dry dressings to the tunneled Christine and Hydrofera Blue to the opening. She states that [facility] is sticking to the Christine bed. She denies signs of infection. 7/7; patient presents for follow-up. She has been using Dakin's wet-to-dry dressings to the tunneled Christine and PolyMem silver to the opening without issues. 7/13; patient presents for follow-up. She has been using PolyMem silver to the opening and the rope to the tunnel. She has no issues or complaints today. She denies signs of infection. 7/20; patient presents for follow-up. We have been using PolyMem silver to the Christine bed. She has no issues or complaints today. She is receiving her custom compression garments tomorrow. 7/27; patient presents for follow-up. She has been using PolyMem silver to the Christine bed. She is having her custom compression garments adjusted as these are not staying on very easily. 8/30; patient presents for follow-up. We have been using PolyMem silver to the Christine bed. She is still waiting on her custom compression garments to be ordered. She denies signs of infection. 8/11; patient presents for follow-up. The Christine VAC has been started and patient has been using this for the past week. Patient has home health who changes the Christine VAC. She developed irritation to the periwound. DuoDERM was not being used to the periwound despite being ordered. 8/15; patient presents for follow-up. She restarted the Christine VAC and has had improvement to the  periwound with  the use of DuoDERM. 8/24; patient presents for follow-up. She has been using the Christine VAC. She has developed a Christine just superior to the original Christine. Likely as a result from the Christine VAC. She denies signs of infection. 8/29; patient presents for follow-up. We have been using collagen to the Christine bed. She has held off on the Christine VAC for the past week. 9/7; patient presents for follow-up. We have been using iodoform packing to the Christine bed. She has mild tenderness to the periwound. She denies systemic signs of infection. 9/12; patient presents for follow-up. Patient has been using Dakin's wet-to-dry dressings. She has no issues or complaints today. She denies signs of infection. 9/21; patient presents for follow-up. PuraPly #1 was placed in standard fashion at last clinic visit. She has no issues or complaints today. She denies signs of infection. 10/3; patient presents for follow-up. PuraPly #2 was placed in standard fashion at last clinic visit. She has no issues or complaints today. She denies signs of infection. 10/10; patient presents for follow-up. PuraPly #3 was placed in standard fashion at last clinic visit. She has no issues or complaints today. She denies signs of infection. 10/17; patient presents for follow-up. She has been using silver alginate to the tunneled Christine. She has no issues or complaints today. 10/31; patient presents for follow-up. We have been doing Dakin's wet-to-dry dressings to the tunneled Christine. The Christine VAC has been ordered however patient has not received this. She currently denies signs of infection. She reports a lot of serosanguineous drainage. 11/14; patient presents for follow-up. She had a PCR culture done at last clinic visit that grew Pseudomonas aeruginosa. She obtained Keystone antibiotic spray yesterday. She also obtained her ultrasound of the left lower extremity to assess for abscess however the vascular lab ordered On erroneous  test. This has been rectified. Patient was not charged for the test. DVT study showed no DVT T also reported to our nurse that they did not notice any abscess s. [name] while doing the DVT rule out. 11/21; patient presents for follow-up. She has been using Keystone antibiotic with gauze packing. She has no issues or complaints today. 11/30; patient presents for follow-up. She has been using Keystone antibiotic with gauze packing. Depth is slightly larger today. She denies signs of infection. 12/5; patient presents for follow-up. She has been using Keystone antibiotic spray with collagen. She has no issues or complaints today. She has been using an Ace wrap for compression therapy. 12/12; patient presents for follow-up. She has been using Keystone antibiotic spray with collagen and Ace wrap for compression. She states that the Ace wrap falls down during the day. She is only able to use this at night. 12/19; patient presents for follow-up. She has been using Keystone antibiotic spray with collagen. She is unable to use an Ace wrap during the day because she states it falls down however she is able to use it at night without issues. She denies signs of infection. 12/26; patient presents for follow-up. She has been using Keystone antibiotic ointment with collagen. She uses an Ace wrap to help with compression at night. She has no issues or complaints today. 1/4; patient presents for follow-up. She is been using Keystone antibiotic with collagen. She uses the Ace wrap at night. She tries to use it in the daytime as well she has no issues or complaints today. Patient History Medical History Cardiovascular Patient has history of Congestive Heart Failure, Hypertension Hospitalization/Surgery History - Cellulitis left leg-  01/03/2022-01/07/2022. Medical A Surgical History Notes nd CITLALI, GAUTNEY ([id number]) [id number] Page 7 of 10 Constitutional Symptoms (General  Health) Infarction of spleen Hematologic/Lymphatic Hypothyroidism Cardiovascular A-Fib Gastrointestinal Gastroesophageal reflux Genitourinary Chronic kidney disease Musculoskeletal Osteoarthritis of knee Psychiatric Anxiety Objective Constitutional respirations regular, non-labored and within target range for patient.. Vitals Time Taken: 2:11 PM, Height: 67 in, Weight: 340 lbs, BMI: 53.2, Temperature: 98.8 F, Pulse: 51 bpm, Respiratory Rate: 18 breaths/min, Blood Pressure: 160/83 mmHg. Cardiovascular 2+ dorsalis pedis/posterior tibialis pulses. Psychiatric pleasant and cooperative. General Notes: Left lower extremity: Open Christine with granulation tissue at the opening with a depth of 1.8 cm no increased warmth, erythema or purulent drainage. No tenderness on palpation. Uncontrolled lymphedema above the knee. Integumentary (Hair, Skin) Christine #1 status is Open. Original cause of Christine was Trauma. The date acquired was: 10/18/2021. The Christine has been in treatment 39 weeks. The Christine is located on the Left,Lateral Lower Leg. The Christine measures 0.3cm length x 0.3cm width x 0.3cm depth; 0.071cm^2 area and 0.021cm^3 volume. There is Fat Layer (Subcutaneous Tissue) exposed. There is tunneling at 12:00 with a maximum distance of 2cm. There is a medium amount of serosanguineous drainage noted. The Christine margin is distinct with the outline attached to the Christine base. There is large (67-100%) red granulation within the Christine bed. There is no necrotic tissue within the Christine bed. The periwound skin appearance exhibited: Induration, Dry/Scaly, Hemosiderin Staining. The periwound skin appearance did not exhibit: Callus, Crepitus, Excoriation, Rash, Scarring, Maceration, Atrophie Blanche, Cyanosis, Ecchymosis, Mottled, Pallor, Rubor, Erythema. Periwound temperature was noted as No Abnormality. The periwound has tenderness on palpation. Assessment Active Problems ICD-10 Non-pressure chronic ulcer  of other part of left lower leg with fat layer exposed Other early complications of trauma, initial encounter Chronic venous hypertension (idiopathic) with ulcer of left lower extremity Lymphedema, not elsewhere classified Paroxysmal atrial fibrillation Long term (current) use of anticoagulants Patient Christine has shown slight improvement in tunnel depth. The Christine has more granulation tissue is filling in from the sides. I recommended continuing with Keystone antibiotic ointment and collagen along with a's bandage for compression. Follow-up in 1 week. Plan Follow-up Appointments: Return Appointment in 1 week. - Dr. Mikey Bussing next Thursday 09/01/22 @ 1030 Return Appointment in 2 weeks. - Dr. Mikey Bussing Other: - Continue Topical antibiotic ointment. use clobetasol antifungal at home around the Christine. Anesthetic: (In clinic) Topical Lidocaine 5% applied to Christine bed Cellular or Tissue Based Products: Christine #1 Left,Lateral Lower Leg: Cellular or Tissue Based Product Type: - 05/12/2022 Puraply AM #1 applied. 05/17/22 PURAPLY AM # 2 05/24/2022 Puraply AM #3 05/31/2022 HOLD Edema Control - Lymphedema / SCD / Other: [name], [name] ([id number]) [id number] Page 8 of 10 Avoid standing for long periods of time. Moisturize legs daily. - both legs every night before bed. Christine #1: - Lower Leg Christine Laterality: Left, Lateral Cleanser: Christine Cleanser (Home Health) 1 x Per Day/30 Days Discharge Instructions: Cleanse the Christine with Christine cleanser prior to applying a clean dressing using gauze sponges, not tissue or cotton balls. Peri-[name]: Ketoconazole Cream 2% 1 x Per Day/30 Days Discharge Instructions: Apply to periwound in clinic. you use clobetasol at home. Topical: keystone 1 x Per Day/30 Days Discharge Instructions: Soak collagen in the keystone and pack into Christine. Collage may be cut into thin strip. Prim Dressing: Promogran Prisma Matrix, 4.34 (sq in) (silver  collagen) (Generic) 1 x Per Day/30 Days ary Discharge Instructions: Moisten collagen with saline or hydrogel Prim Dressing: Plain packing  strip 1/4 (in) 1 x Per Day/30 Days ary Discharge Instructions: Lightly pack a small amount behind the collagen. leave a tail out of the Christine to remove. Prim Dressing: Compounding topical antibiotics 1 x Per Day/30 Days ary Discharge Instructions: apply antibiotic moisted gauze lightly packed into Christine bed. Secondary Dressing: ABD Pad, 5x9 1 x Per Day/30 Days Discharge Instructions: Apply over primary dressing as directed. Secondary Dressing: Woven Gauze Sponge, Non-Sterile 4x4 in (Home Health) 1 x Per Day/30 Days Discharge Instructions: Apply over primary dressing as directed. Secured With: [employer], 4.5x3.1 (in/yd) (Home Health) 1 x Per Day/30 Days Discharge Instructions: Secure with Kerlix as directed. Secured With: 57M Medipore H Soft Cloth Surgical T ape, 4 x 10 (in/yd) (Home Health) 1 x Per Day/30 Days Discharge Instructions: Secure with tape as directed. Com pression Wrap: ace wrap (Home Health) 1 x Per Day/30 Days Discharge Instructions: apply to leg and knee. 1. Collagen and Keystone antibiotic 2. Ace bandage 3. Follow-up in 1 week Electronic Signature(s) Signed: 08/25/2022 2:54:58 PM By: Kalman Shan DO Entered By: Kalman Shan on 08/25/2022 14:43:25 -------------------------------------------------------------------------------- HxROS Details Patient Name: Date of Service: [name], [name]. 08/25/2022 2:15 PM Medical Record Number: [id number] Patient Account Number: [id number] Date of Birth/Sex: Treating RN: 06-16-1951 (72 y.o. F) Primary [name] Provider: Deland Pretty Other Clinician: Referring Provider: Treating Provider/Extender: Judie Grieve in Treatment: 53 Constitutional Symptoms (General Health) Medical History: Past Medical History Notes: Infarction of  spleen Hematologic/Lymphatic Medical History: Past Medical History Notes: Hypothyroidism Cardiovascular Medical History: Positive for: Congestive Heart Failure; Hypertension Past Medical History Notes: A-Fib Gastrointestinal Medical History: Past Medical History Notes: Gastroesophageal reflux Genitourinary Medical HistoryMarland Kitchen [name], [name] ([id number]) [id number] Page 9 of 10 Past Medical History Notes: Chronic kidney disease Musculoskeletal Medical History: Past Medical History Notes: Osteoarthritis of knee Psychiatric Medical History: Past Medical History Notes: Anxiety Immunizations Pneumococcal Vaccine: Received Pneumococcal Vaccination: No Implantable Devices No devices added Hospitalization / Surgery History Type of Hospitalization/Surgery Cellulitis left leg- 01/03/2022-01/07/2022 Electronic Signature(s) Signed: 08/25/2022 2:54:58 PM By: Kalman Shan DO Entered By: Kalman Shan on 08/25/2022 14:39:19 -------------------------------------------------------------------------------- SuperBill Details Patient Name: Date of Service: Christine Simmonds [name]. 08/25/2022 Medical Record Number: [id number] Patient Account Number: [id number] Date of Birth/Sex: Treating RN: Nov 20, 1950 (72 y.o. F) Primary [name] Provider: Deland Pretty Other Clinician: Referring Provider: Treating Provider/Extender: Judie Grieve in Treatment: 39 Diagnosis Coding ICD-10 Codes Code Description [phone] Non-pressure chronic ulcer of other part of left lower leg with fat layer exposed T79.8XXA Other early complications of trauma, initial encounter I87.312 Chronic venous hypertension (idiopathic) with ulcer of left lower extremity I89.0 Lymphedema, not elsewhere classified I48.0 Paroxysmal atrial fibrillation Z79.01 Long term (current) use of anticoagulants Facility Procedures : CPT4 Code: [id number] Description: 99214 - [name]  VISIT-LEV 4 EST PT Modifier: Quantity: 1 Physician Procedures : CPT4 Code Description Modifier [id number] - WC PHYS LEVEL 3 - EST PT ICD-10 Diagnosis Description L97.822 Non-pressure chronic ulcer of other part of left lower leg with fat layer exposed T79.8XXA Other early complications of trauma, initial  encounter I87.312 Chronic venous hypertension (idiopathic) with ulcer of left lower extremity I89.0 Lymphedema, not elsewhere classified CECILA, SATCHER ([id number]) [phone].pdf Quantity: 1 Page 10 of 10 Electronic Signature(s) Signed: 08/25/2022 3:33:48 PM By: Kalman Shan DO Signed: 08/26/2022 12:14:03 PM By: Rhae Hammock RN Previous Signature: 08/25/2022 2:54:58 PM Version By: Kalman Shan DO Entered By: Rhae Hammock on 08/25/2022 15:18:39

## 2022-08-25 NOTE — Progress Notes (Signed)
Christine, [name] ([id number]) [id number] Page 1 of 9 Visit Report for 07/26/2022 Arrival Information Details Patient Name: Date of Service: [name] 07/26/2022 11:00 A M Medical Record Number: [id number] Patient Account Number: [id number] Date of Birth/Sex: Treating RN: 09-Apr-1951 (72 y.o. Tonita [name], Christine Primary Care Yordan Martindale: Deland Pretty Other Clinician: Referring Khylin Gutridge: Treating Keilynn Marano/Extender: Judie Grieve in Treatment: 5 Visit Information History Since Last Visit Added or deleted any medications: No Patient Arrived: [name] Any new allergies or adverse reactions: No Arrival Time: 11:03 Had a fall or experienced change in No Accompanied By: self activities of daily living that may affect Transfer Assistance: Manual risk of falls: Patient Identification Verified: Yes Signs or symptoms of abuse/neglect since last visito No Secondary Verification Process Completed: Yes Hospitalized since last visit: No Patient Requires Transmission-Based Precautions: No Implantable device outside of the clinic excluding No Patient Has Alerts: Yes cellular tissue based products placed in the center Patient Alerts: Patient on Blood Thinner since last visit: Has Dressing in Place as Prescribed: Yes Pain Present Now: No Electronic Signature(s) Signed: 08/24/2022 5:35:40 PM By: Rhae Hammock RN Entered By: Rhae Hammock on 07/26/2022 11:04:10 -------------------------------------------------------------------------------- Clinic Level of Care Assessment Details Patient Name: Date of Service: [name] 07/26/2022 11:00 A M Medical Record Number: [id number] Patient Account Number: [id number] Date of Birth/Sex: Treating RN: August 22, 1951 (72 y.o. [name] Primary Care Jerimyah Vandunk: Deland Pretty Other Clinician: Referring Alfard Cochrane: Treating Anastacio Bua/Extender: Judie Grieve in  Treatment: 35 Clinic Level of Care Assessment Items TOOL 4 Quantity Score X- 1 0 Use when only an EandM is performed on FOLLOW-UP visit ASSESSMENTS - Nursing Assessment / Reassessment X- 1 10 Reassessment of Co-morbidities (includes updates in patient status) X- 1 5 Reassessment of Adherence to Treatment Plan ASSESSMENTS - Wound and Skin A ssessment / Reassessment X - Simple Wound Assessment / Reassessment - one wound 1 5 _0  - 0 Complex Wound Assessment / Reassessment - multiple wounds X- 1 10 Dermatologic / Skin Assessment (not related to wound area) ASSESSMENTS - Focused Assessment _1  - 0 Circumferential Edema Measurements - multi extremities _2  - 0 Nutritional Assessment / Counseling / Intervention GRIER, CZERWINSKI ([id number]) [id number] Page 2 of 9 _3  - 0 Lower Extremity Assessment (monofilament, tuning fork, pulses) _4  - 0 Peripheral Arterial Disease Assessment (using hand held doppler) ASSESSMENTS - Ostomy and/or Continence Assessment and Care _5  - 0 Incontinence Assessment and Management _6  - 0 Ostomy Care Assessment and Management (repouching, etc.) PROCESS - Coordination of Care X - Simple Patient / Family Education for ongoing care 1 15 _7  - 0 Complex (extensive) Patient / Family Education for ongoing care X- 1 10 Staff obtains Programmer, systems, Records, T Results / Process Orders est X- 1 10 Staff telephones HHA, Nursing Homes / Clarify orders / etc _8  - 0 Routine Transfer to another Facility (non-emergent condition) _9  - 0 Routine Hospital Admission (non-emergent condition) _10  - 0 New Admissions / Biomedical engineer / Ordering NPWT Apligraf, etc. , _11  - 0 Emergency Hospital Admission (emergent condition) X- 1 10 Simple Discharge Coordination _12  - 0 Complex (extensive) Discharge Coordination PROCESS - Special Needs _13  - 0 Pediatric / Minor Patient Management _14  - 0 Isolation Patient Management _15  - 0 Hearing / Language  / Visual special needs _16  - 0 Assessment of Community assistance (transportation, D/C planning, etc.) _17  - 0 Additional assistance / Altered mentation _18  - 0 Support Surface(s) Assessment (bed, cushion, seat, etc.) INTERVENTIONS -  Wound Cleansing / Measurement X - Simple Wound Cleansing - one wound 1 5 _0  - 0 Complex Wound Cleansing - multiple wounds X- 1 5 Wound Imaging (photographs - any number of wounds) _1  - 0 Wound Tracing (instead of photographs) X- 1 5 Simple Wound Measurement - one wound _2  - 0 Complex Wound Measurement - multiple wounds INTERVENTIONS - Wound Dressings X - Small Wound Dressing one or multiple wounds 1 10 _3  - 0 Medium Wound Dressing one or multiple wounds _4  - 0 Large Wound Dressing one or multiple wounds <WUJWJXBJYNWGNFAO>_1<\/HYQMVHQIONGEXBMW>_4  - 0 Application of Medications - topical <XLKGMWNUUVOZDGUY>_4<\/IHKVQQVZDGLOVFIE>_3  - 0 Application of Medications - injection INTERVENTIONS - Miscellaneous _7  - 0 External ear exam _8  - 0 Specimen Collection (cultures, biopsies, blood, body fluids, etc.) _9  - 0 Specimen(s) / Culture(s) sent or taken to Lab for analysis _10  - 0 Patient Transfer (multiple staff / Civil Service fast streamer / Similar devices) _11  - 0 Simple Staple / Suture removal (25 or less) _12  - 0 Complex Staple / Suture removal (26 or more) _13  - 0 Hypo / Hyperglycemic Management (close monitor of Blood Glucose) GLENNETTE, GALSTER ([id number]) [id number] Page 3 of 9 _14  - 0 Ankle / Brachial Index (ABI) - do not check if billed separately X- 1 5 Vital Signs Has the patient been seen at the hospital within the last three years: Yes Total Score: 105 Level Of Care: New/Established - Level 3 Electronic Signature(s) Signed: 07/26/2022 3:03:04 PM By: Deon Pilling RN, BSN Entered By: Deon Pilling on 07/26/2022 11:26:33 -------------------------------------------------------------------------------- Encounter Discharge Information Details Patient Name: Date of Service: [name] 07/26/2022  11:00 A M Medical Record Number: [id number] Patient Account Number: [id number] Date of Birth/Sex: Treating RN: 01-26-1951 (72 y.o. [name] Primary Care Graciella Arment: Deland Pretty Other Clinician: Referring Dayjah Selman: Treating Marayah Higdon/Extender: Judie Grieve in Treatment: 28 Encounter Discharge Information Items Discharge Condition: Stable Ambulatory Status: Ambulatory Discharge Destination: Home Transportation: Private Auto Accompanied By: self Schedule Follow-up Appointment: Yes Clinical Summary of Care: Electronic Signature(s) Signed: 07/26/2022 3:03:04 PM By: Deon Pilling RN, BSN Entered By: Deon Pilling on 07/26/2022 11:27:20 -------------------------------------------------------------------------------- Lower Extremity Assessment Details Patient Name: Date of Service: [name] 07/26/2022 11:00 A M Medical Record Number: [id number] Patient Account Number: [id number] Date of Birth/Sex: Treating RN: 03/13/1951 (72 y.o. Tonita [name], Christine Primary Care Khalani Novoa: Deland Pretty Other Clinician: Referring Fronia Depass: Treating Danika Kluender/Extender: Judie Grieve in Treatment: 35 Edema Assessment Assessed: Shirlyn Goltz: Yes] Patrice Paradise: No] Edema: [Left: Ye] [Right: s] Calf Left: Right: Point of Measurement: 31 cm From Medial Instep 52 cm Ankle Left: Right: Point of Measurement: 9 cm From Medial Instep 26.5 cm Vascular Assessment LIISA, PICONE ([id number]) [Right:[id number] Page 4 of 9] Pulses: Dorsalis Pedis Palpable: [Left:Yes] Posterior Tibial Palpable: [Left:Yes] Electronic Signature(s) Signed: 08/24/2022 5:35:40 PM By: Rhae Hammock RN Entered By: Rhae Hammock on 07/26/2022 11:14:24 -------------------------------------------------------------------------------- Multi Wound Chart Details Patient Name: Date of Service: [name] 07/26/2022 11:00 A M Medical Record  Number: [id number] Patient Account Number: [id number] Date of Birth/Sex: Treating RN: 03-13-1951 (72 y.o. F) Primary Care Brianah Hopson: Deland Pretty Other Clinician: Referring Bernetta Sutley: Treating Saylor Murry/Extender: Judie Grieve in Treatment: 35 Vital Signs Height(in): 67 Pulse(bpm): 37 Weight(lbs): 340 Blood Pressure(mmHg): 146/84 Body Mass Index(BMI): 53.2 Temperature(F): 97.9 Respiratory Rate(breaths/min): 17 [1:Photos:] [N/A:N/A] Left, Lateral Lower Leg N/A N/A Wound Location: Trauma N/A N/A Wounding Event: Trauma, Other N/A N/A Primary Etiology: Congestive Heart Failure, N/A N/A Comorbid  History: Hypertension 10/18/2021 N/A N/A Date Acquired: 28 N/A N/A Weeks of Treatment: Open N/A N/A Wound Status: No N/A N/A Wound Recurrence: Yes N/A N/A Clustered Wound: 1 N/A N/A Clustered Quantity: 0.2x0.3x0.5 N/A N/A Measurements L x W x D (cm) 0.047 N/A N/A A (cm) : rea 0.024 N/A N/A Volume (cm) : 100.00% N/A N/A % Reduction in A rea: 100.00% N/A N/A % Reduction in Volume: 11 Position 1 (o'clock): 3.3 Maximum Distance 1 (cm): Yes N/A N/A Tunneling: Full Thickness With Exposed Support N/A N/A Classification: Structures Medium N/A N/A Exudate Amount: Serosanguineous N/A N/A Exudate Type: red, brown N/A N/A Exudate Color: Distinct, outline attached N/A N/A Wound Margin: Large (67-100%) N/A N/A Granulation Amount: Red N/A N/A Granulation Quality: None Present (0%) N/A N/A Necrotic Amount: Fat Layer (Subcutaneous Tissue): Yes N/A N/A Exposed Structures: Fascia: No Tendon: No Muscle: No Joint: No ALVERA, TOURIGNY ([id number]) [id number] Page 5 of 9 Bone: No Large (67-100%) N/A N/A Epithelialization: Induration: Yes N/A N/A Periwound Skin Texture: Excoriation: No Callus: No Crepitus: No Rash: No Scarring: No Maceration: No N/A N/A Periwound Skin Moisture: Dry/Scaly: No Hemosiderin Staining:  Yes N/A N/A Periwound Skin Color: Atrophie Blanche: No Cyanosis: No Ecchymosis: No Erythema: No Mottled: No Pallor: No Rubor: No No Abnormality N/A N/A Temperature: Yes N/A N/A Tenderness on Palpation: Treatment Notes Wound #1 (Lower Leg) Wound Laterality: Left, Lateral Cleanser Wound Cleanser Discharge Instruction: Cleanse the wound with wound cleanser prior to applying a clean dressing using gauze sponges, not tissue or cotton balls. Peri-Wound Care Ketoconazole Cream 2% Discharge Instruction: Apply to periwound in clinic. you use clobetasol at home. Topical keystone Discharge Instruction: Soak collagen in the [place] and pack into wound. Collage may be cut into thin strip. Primary Dressing Promogran Prisma Matrix, 4.34 (sq in) (silver collagen) Discharge Instruction: Moisten collagen with saline or hydrogel Compounding topical antibiotics Discharge Instruction: apply antibiotic moisted gauze lightly packed into wound bed. Secondary Dressing ABD Pad, 5x9 Discharge Instruction: Apply over primary dressing as directed. Woven Gauze Sponge, Non-Sterile 4x4 in Discharge Instruction: Apply over primary dressing as directed. Secured With [employer], 4.5x3.1 (in/yd) Discharge Instruction: Secure with Kerlix as directed. 66M Medipore H Soft Cloth Surgical T ape, 4 x 10 (in/yd) Discharge Instruction: Secure with tape as directed. Compression Wrap ace wrap Compression Stockings Add-Ons Electronic Signature(s) Signed: 07/26/2022 11:32:14 AM By: Kalman Shan DO Entered By: Kalman Shan on 07/26/2022 11:27:13 Multi-Disciplinary Care Plan Details -------------------------------------------------------------------------------- Georgiana Spinner ([id number]) [id number] Page 6 of 9 Patient Name: Date of Service: [name] 07/26/2022 11:00 A M Medical Record Number: [id number] Patient Account Number: [id number] Date of  Birth/Sex: Treating RN: Jun 24, 1951 (72 y.o. [name], Meta.Reding Primary Care Muhanad Torosyan: Deland Pretty Other Clinician: Referring Josimar Corning: Treating Weslie Rasmus/Extender: Judie Grieve in Treatment: 30 Active Inactive Abuse / Safety / Falls / Self Care Management Nursing Diagnoses: History of Falls Potential for falls Goals: Patient/caregiver will verbalize/demonstrate measure taken to improve self care Date Initiated: 11/19/2021 Date Inactivated: 04/05/2022 Target Resolution Date: 04/21/2022 Goal Status: Met Patient/caregiver will verbalize/demonstrate measures taken to prevent injury and/or falls Date Initiated: 11/19/2021 Target Resolution Date: 08/19/2022 Goal Status: Active Interventions: Provide education on basic hygiene Provide education on fall prevention Provide education on HBO safety Provide education on vaccinations Treatment Activities: Education provided on Basic Hygiene : 05/17/2022 Notes: Pain, Acute or Chronic Nursing Diagnoses: Pain, acute or chronic: actual or potential Potential alteration in comfort, pain Goals: Patient will verbalize adequate pain control  and receive pain control interventions during procedures as needed Date Initiated: 11/19/2021 Date Inactivated: 04/05/2022 Target Resolution Date: 04/22/2022 Goal Status: Met Patient/caregiver will verbalize comfort level met Date Initiated: 11/19/2021 Target Resolution Date: 08/19/2022 Goal Status: Active Interventions: Encourage patient to take pain medications as prescribed Provide education on pain management Reposition patient for comfort Treatment Activities: Administer pain control measures as ordered : 11/19/2021 Notes: Electronic Signature(s) Signed: 07/26/2022 3:03:04 PM By: Deon Pilling RN, BSN Entered By: Deon Pilling on 07/26/2022 11:11:03 -------------------------------------------------------------------------------- Pain Assessment Details Patient Name: Date of  Service: [name] 07/26/2022 11:00 A M Medical Record Number: [id number] Patient Account Number: [id number] MAJESTA, LEICHTER ([id number]) [id number] Page 7 of 9 Date of Birth/Sex: Treating RN: 22-Jul-1951 (72 y.o. Tonita [name], Christine Primary Care Gerrald Basu: Deland Pretty Other Clinician: Referring Xzayvier Fagin: Treating Ilisa Hayworth/Extender: Judie Grieve in Treatment: 35 Active Problems Location of Pain Severity and Description of Pain Patient Has Paino No Site Locations Pain Management and Medication Current Pain Management: Electronic Signature(s) Signed: 08/24/2022 5:35:40 PM By: Rhae Hammock RN Entered By: Rhae Hammock on 07/26/2022 11:04:35 -------------------------------------------------------------------------------- Patient/Caregiver Education Details Patient Name: Date of Service: NO RRIS, [name] 12/5/2023andnbsp11:00 A M Medical Record Number: [id number] Patient Account Number: [id number] Date of Birth/Gender: Treating RN: 10-Jun-1951 (71 y.o. [name] Primary Care Physician: Deland Pretty Other Clinician: Referring Physician: Treating Physician/Extender: Judie Grieve in Treatment: 35 Education Assessment Education Provided To: Patient Education Topics Provided Pain: Handouts: A Guide to Pain Control Methods: Explain/Verbal Responses: Reinforcements needed Electronic Signature(s) Signed: 07/26/2022 3:03:04 PM By: Deon Pilling RN, BSN Entered By: Deon Pilling on 07/26/2022 11:11:14 Georgiana Spinner ([id number]) [id number] Page 8 of 9 -------------------------------------------------------------------------------- Wound Assessment Details Patient Name: Date of Service: [name] 07/26/2022 11:00 A M Medical Record Number: [id number] Patient Account Number: [id number] Date of Birth/Sex: Treating RN: 14-Jul-1951 (72 y.o. Tonita [name], Christine Primary Care Jovahn Breit: Deland Pretty Other Clinician: Referring Aspasia Rude: Treating Ayannah Faddis/Extender: Judie Grieve in Treatment: 35 Wound Status Wound Number: 1 Primary Etiology: Trauma, Other Wound Location: Left, Lateral Lower Leg Wound Status: Open Wounding Event: Trauma Comorbid History: Congestive Heart Failure, Hypertension Date Acquired: 10/18/2021 Weeks Of Treatment: 35 Clustered Wound: Yes Photos Wound Measurements Length: (cm) Width: (cm) Depth: (cm) Clustered Quantity: Area: (cm) Volume: (cm) 0.2 % Reduction in Area: 100% 0.3 % Reduction in Volume: 100% 0.5 Epithelialization: Large (67-100%) 1 Tunneling: Yes 0.047 Position (o'clock): 11 0.024 Maximum Distance: (cm) 3.3 Undermining: No Wound Description Classification: Full Thickness With Exposed Suppor Wound Margin: Distinct, outline attached Exudate Amount: Medium Exudate Type: Serosanguineous Exudate Color: red, brown t Structures Foul Odor After Cleansing: No Slough/Fibrino No Wound Bed Granulation Amount: Large (67-100%) Exposed Structure Granulation Quality: Red Fascia Exposed: No Necrotic Amount: None Present (0%) Fat Layer (Subcutaneous Tissue) Exposed: Yes Tendon Exposed: No Muscle Exposed: No Joint Exposed: No Bone Exposed: No Periwound Skin Texture Texture Color No Abnormalities Noted: No No Abnormalities Noted: No Callus: No Atrophie Blanche: No Crepitus: No Cyanosis: No Excoriation: No Ecchymosis: No Induration: Yes Erythema: No Rash: No Hemosiderin Staining: Yes Scarring: No Mottled: No Pallor: No Moisture Rubor: No No Abnormalities Noted: No JANNICE, BEITZEL ([id number]) [id number] Page 9 of 9 Dry / Scaly: No Temperature / Pain Maceration: No Temperature: No Abnormality Tenderness on Palpation: Yes Electronic Signature(s) Signed: 07/26/2022 3:03:04 PM By: Deon Pilling RN, BSN Signed: 08/24/2022  5:35:40 PM By: Rhae Hammock RN Entered By: Deon Pilling on  07/26/2022 11:24:34 -------------------------------------------------------------------------------- Vitals Details Patient Name: Date of Service: [name] 07/26/2022 11:00 A M Medical Record Number: [id number] Patient Account Number: [id number] Date of Birth/Sex: Treating RN: [birth year] (72 y.o. Tonita [name], Christine Primary Care Sylva Overley: Deland Pretty Other Clinician: Referring Ariyanah Aguado: Treating Larrell Rapozo/Extender: Judie Grieve in Treatment: 35 Vital Signs Time Taken: 11:04 Temperature (F): 97.9 Height (in): 67 Pulse (bpm): 57 Weight (lbs): 340 Respiratory Rate (breaths/min): 17 Body Mass Index (BMI): 53.2 Blood Pressure (mmHg): 146/84 Reference Range: 80 - 120 mg / dl Electronic Signature(s) Signed: 08/24/2022 5:35:40 PM By: Rhae Hammock RN Entered By: Rhae Hammock on 07/26/2022 11:04:29

## 2022-08-25 NOTE — Progress Notes (Signed)
[name], [name] ([id number]) [id number] Page 1 of 8 Visit Report for 06/21/2022 Arrival Information Details Patient Name: Date of Service: [name] 06/21/2022 12:45 PM Medical Record Number: [id number] Patient Account Number: [id number] Date of Birth/Sex: Treating RN: 12-28-1950 (72 y.o. [name], [name] Primary Care Hakiem Malizia: Deland Pretty Other Clinician: Referring Mattisen Pohlmann: Treating Anastacia Reinecke/Extender: Judie Grieve in Treatment: 46 Visit Information History Since Last Visit Added or deleted any medications: No Patient Arrived: [name] Any new allergies or adverse reactions: No Arrival Time: 12:59 Had a fall or experienced change in No Accompanied By: self activities of daily living that may affect Transfer Assistance: None risk of falls: Patient Identification Verified: Yes Signs or symptoms of abuse/neglect since last visito No Secondary Verification Process Completed: Yes Hospitalized since last visit: No Patient Requires Transmission-Based Precautions: No Implantable device outside of the clinic excluding No Patient Has Alerts: Yes cellular tissue based products placed in the center Patient Alerts: Patient on Blood Thinner since last visit: Has Dressing in Place as Prescribed: Yes Pain Present Now: No Electronic Signature(s) Signed: 08/24/2022 5:36:43 PM By: Rhae Hammock RN Entered By: Rhae Hammock on 06/21/2022 13:00:04 -------------------------------------------------------------------------------- Clinic Level of Care Assessment Details Patient Name: Date of Service: [name] 06/21/2022 12:45 PM Medical Record Number: [id number] Patient Account Number: [id number] Date of Birth/Sex: Treating RN: 06/15/1951 (73 y.o. [name] Primary Care Zyrell Carmean: Deland Pretty Other Clinician: Referring Bobette Leyh: Treating Tristin Vandeusen/Extender: Judie Grieve in  Treatment: 30 Clinic Level of Care Assessment Items TOOL 4 Quantity Score X- 1 0 Use when only an EandM is performed on FOLLOW-UP visit ASSESSMENTS - Nursing Assessment / Reassessment X- 1 10 Reassessment of Co-morbidities (includes updates in patient status) X- 1 5 Reassessment of Adherence to Treatment Plan ASSESSMENTS - Wound and Skin A ssessment / Reassessment _0  - 0 Simple Wound Assessment / Reassessment - one wound X- 1 5 Complex Wound Assessment / Reassessment - multiple wounds X- 1 10 Dermatologic / Skin Assessment (not related to wound area) ASSESSMENTS - Focused Assessment X- 1 5 Circumferential Edema Measurements - multi extremities _1  - 0 Nutritional Assessment / Counseling / Intervention GREENLEY, [name] ([id number]) [id number] Page 2 of 8 _2  - 0 Lower Extremity Assessment (monofilament, tuning fork, pulses) _3  - 0 Peripheral Arterial Disease Assessment (using hand held doppler) ASSESSMENTS - Ostomy and/or Continence Assessment and Care _4  - 0 Incontinence Assessment and Management _5  - 0 Ostomy Care Assessment and Management (repouching, etc.) PROCESS - Coordination of Care _6  - 0 Simple Patient / Family Education for ongoing care X- 1 20 Complex (extensive) Patient / Family Education for ongoing care X- 1 10 Staff obtains Programmer, systems, Records, T Results / Process Orders est X- 1 10 Staff telephones HHA, Nursing Homes / Clarify orders / etc _7  - 0 Routine Transfer to another Facility (non-emergent condition) _8  - 0 Routine Hospital Admission (non-emergent condition) _9  - 0 New Admissions / Biomedical engineer / Ordering NPWT Apligraf, etc. , _10  - 0 Emergency Hospital Admission (emergent condition) _11  - 0 Simple Discharge Coordination X- 1 15 Complex (extensive) Discharge Coordination PROCESS - Special Needs _12  - 0 Pediatric / Minor Patient Management _13  - 0 Isolation Patient Management _14  - 0 Hearing / Language  / Visual special needs _15  - 0 Assessment of Community assistance (transportation, D/C planning, etc.) _16  - 0 Additional assistance / Altered mentation _17  - 0 Support Surface(s) Assessment (bed, cushion, seat, etc.) INTERVENTIONS - Wound Cleansing / Measurement _18  -  0 Simple Wound Cleansing - one wound X- 1 5 Complex Wound Cleansing - multiple wounds X- 1 5 Wound Imaging (photographs - any number of wounds) _0  - 0 Wound Tracing (instead of photographs) _1  - 0 Simple Wound Measurement - one wound X- 1 5 Complex Wound Measurement - multiple wounds INTERVENTIONS - Wound Dressings _2  - 0 Small Wound Dressing one or multiple wounds X- 1 15 Medium Wound Dressing one or multiple wounds _3  - 0 Large Wound Dressing one or multiple wounds X- 1 5 Application of Medications - topical <CXKGYJEHUDJSHFWY>_6<\/VZCHYIFOYDXAJOIN>_8  - 0 Application of Medications - injection INTERVENTIONS - Miscellaneous _5  - 0 External ear exam X- 1 5 Specimen Collection (cultures, biopsies, blood, body fluids, etc.) _6  - 0 Specimen(s) / Culture(s) sent or taken to Lab for analysis _7  - 0 Patient Transfer (multiple staff / Civil Service fast streamer / Similar devices) _8  - 0 Simple Staple / Suture removal (25 or less) _9  - 0 Complex Staple / Suture removal (26 or more) _10  - 0 Hypo / Hyperglycemic Management (close monitor of Blood Glucose) SHATARIA, CRIST ([id number]) [id number] Page 3 of 8 _11  - 0 Ankle / Brachial Index (ABI) - do not check if billed separately X- 1 5 Vital Signs Has the patient been seen at the hospital within the last three years: Yes Total Score: 135 Level Of Care: New/Established - Level 4 Electronic Signature(s) Signed: 06/22/2022 10:46:15 AM By: Deon Pilling RN, BSN Entered By: Deon Pilling on 06/21/2022 14:18:27 -------------------------------------------------------------------------------- Encounter Discharge Information Details Patient Name: Date of Service: [name].  06/21/2022 12:45 PM Medical Record Number: [id number] Patient Account Number: [id number] Date of Birth/Sex: Treating RN: 23-Jun-1951 (72 y.o. [name] Primary Care Dashonna Chagnon: Deland Pretty Other Clinician: Referring Zlatan Hornback: Treating Tami Blass/Extender: Judie Grieve in Treatment: 30 Encounter Discharge Information Items Discharge Condition: Stable Ambulatory Status: Walker Discharge Destination: Home Transportation: Private Auto Accompanied By: self Schedule Follow-up Appointment: Yes Clinical Summary of Care: Electronic Signature(s) Signed: 06/22/2022 10:46:15 AM By: Deon Pilling RN, BSN Entered By: Deon Pilling on 06/21/2022 14:18:56 -------------------------------------------------------------------------------- Lower Extremity Assessment Details Patient Name: Date of Service: [name] 06/21/2022 12:45 PM Medical Record Number: [id number] Patient Account Number: [id number] Date of Birth/Sex: Treating RN: 09/05/1950 (71 y.o. [name], [name] Primary Care Javante Nilsson: Deland Pretty Other Clinician: Referring Jamontae Thwaites: Treating Calab Sachse/Extender: Judie Grieve in Treatment: 30 Edema Assessment Assessed: Shirlyn Goltz: Yes] Patrice Paradise: No] Edema: [Left: Ye] [Right: s] Calf Left: Right: Point of Measurement: 31 cm From Medial Instep 47 cm Ankle Left: Right: Point of Measurement: 9 cm From Medial Instep 25 cm Vascular Assessment JENNICE, RENEGAR ([id number]) [Right:[id number] Page 4 of 8] Pulses: Dorsalis Pedis Palpable: [Left:Yes] Posterior Tibial Palpable: [Left:Yes] Electronic Signature(s) Signed: 08/24/2022 5:36:43 PM By: Rhae Hammock RN Entered By: Rhae Hammock on 06/21/2022 13:00:31 -------------------------------------------------------------------------------- Multi Wound Chart Details Patient Name: Date of Service: [name]. 06/21/2022 12:45 PM Medical  Record Number: [id number] Patient Account Number: [id number] Date of Birth/Sex: Treating RN: 09/12/1950 (72 y.o. F) Primary Care Evita Merida: Deland Pretty Other Clinician: Referring Tyronne Blann: Treating Tajay Muzzy/Extender: Judie Grieve in Treatment: 30 Vital Signs Height(in): 67 Pulse(bpm): 47 Weight(lbs): 340 Blood Pressure(mmHg): 157/74 Body Mass Index(BMI): 53.2 Temperature(F): 97.8 Respiratory Rate(breaths/min): 17 [1:Photos:] [N/A:N/A] Left, Lateral Lower Leg N/A N/A Wound Location: Trauma N/A N/A Wounding Event: Trauma, Other N/A N/A Primary Etiology: Congestive Heart Failure, N/A N/A Comorbid History: Hypertension 10/18/2021 N/A N/A Date Acquired: 30 N/A N/A Weeks  of Treatment: Open N/A N/A Wound Status: No N/A N/A Wound Recurrence: Yes N/A N/A Clustered Wound: 1 N/A N/A Clustered Quantity: 0.5x0.5x0.5 N/A N/A Measurements L x W x D (cm) 0.196 N/A N/A A (cm) : rea 0.098 N/A N/A Volume (cm) : 99.90% N/A N/A % Reduction in A rea: 100.00% N/A N/A % Reduction in Volume: 11 Position 1 (o'clock): 3.3 Maximum Distance 1 (cm): Yes N/A N/A Tunneling: Full Thickness With Exposed Support N/A N/A Classification: Structures Medium N/A N/A Exudate Amount: Serosanguineous N/A N/A Exudate Type: red, brown N/A N/A Exudate Color: Distinct, outline attached N/A N/A Wound Margin: Large (67-100%) N/A N/A Granulation Amount: Red, Friable N/A N/A Granulation Quality: None Present (0%) N/A N/A Necrotic Amount: Fat Layer (Subcutaneous Tissue): Yes N/A N/A Exposed Structures: Fascia: No Tendon: No Muscle: No Joint: No NORAA, PICKERAL ([id number]) [id number] Page 5 of 8 Bone: No Large (67-100%) N/A N/A Epithelialization: Induration: Yes N/A N/A Periwound Skin Texture: Excoriation: No Callus: No Crepitus: No Rash: No Scarring: No Maceration: No N/A N/A Periwound Skin Moisture: Dry/Scaly:  No Hemosiderin Staining: Yes N/A N/A Periwound Skin Color: Atrophie Blanche: No Cyanosis: No Ecchymosis: No Erythema: No Mottled: No Pallor: No Rubor: No No Abnormality N/A N/A Temperature: Yes N/A N/A Tenderness on Palpation: Treatment Notes Electronic Signature(s) Signed: 06/24/2022 1:40:10 PM By: Kalman Shan DO Entered By: Kalman Shan on 06/21/2022 14:00:55 -------------------------------------------------------------------------------- Multi-Disciplinary Care Plan Details Patient Name: Date of Service: [name]. 06/21/2022 12:45 PM Medical Record Number: [id number] Patient Account Number: [id number] Date of Birth/Sex: Treating RN: [birth year] (72 y.o. [name], Tammi Klippel Primary Care Laretta Pyatt: Deland Pretty Other Clinician: Referring Wilmont Olund: Treating Skylar Priest/Extender: Judie Grieve in Treatment: 30 Active Inactive Abuse / Safety / Falls / Self Care Management Nursing Diagnoses: History of Falls Potential for falls Goals: Patient/caregiver will verbalize/demonstrate measure taken to improve self care Date Initiated: 11/19/2021 Date Inactivated: 04/05/2022 Target Resolution Date: 04/21/2022 Goal Status: Met Patient/caregiver will verbalize/demonstrate measures taken to prevent injury and/or falls Date Initiated: 11/19/2021 Target Resolution Date: 07/22/2022 Goal Status: Active Interventions: Provide education on basic hygiene Provide education on fall prevention Provide education on HBO safety Provide education on vaccinations Treatment Activities: Education provided on Basic Hygiene : 05/17/2022 Notes: Pain, Acute or Chronic Nursing Diagnoses: Pain, acute or chronic: actual or potential CLOVIS, WARWICK ([id number]) [id number] Page 6 of 8 Potential alteration in comfort, pain Goals: Patient will verbalize adequate pain control and receive pain control interventions during procedures as  needed Date Initiated: 11/19/2021 Date Inactivated: 04/05/2022 Target Resolution Date: 04/22/2022 Goal Status: Met Patient/caregiver will verbalize comfort level met Date Initiated: 11/19/2021 Target Resolution Date: 07/22/2022 Goal Status: Active Interventions: Encourage patient to take pain medications as prescribed Provide education on pain management Reposition patient for comfort Treatment Activities: Administer pain control measures as ordered : 11/19/2021 Notes: Electronic Signature(s) Signed: 06/22/2022 10:46:15 AM By: Deon Pilling RN, BSN Entered By: Deon Pilling on 06/21/2022 13:36:01 -------------------------------------------------------------------------------- Pain Assessment Details Patient Name: Date of Service: [name]. 06/21/2022 12:45 PM Medical Record Number: [id number] Patient Account Number: [id number] Date of Birth/Sex: Treating RN: 1951/04/28 (72 y.o. [name], [name] Primary Care Chesnee Floren: Deland Pretty Other Clinician: Referring Nyaisha Simao: Treating Sladen Plancarte/Extender: Judie Grieve in Treatment: 30 Active Problems Location of Pain Severity and Description of Pain Patient Has Paino No Site Locations Pain Management and Medication Current Pain Management: Electronic Signature(s) Signed: 08/24/2022 5:36:43 PM By: Rhae Hammock RN Entered By: Rhae Hammock on 06/21/2022 13:00:24  CATHLENE, GARDELLA ([id number]) [id number] Page 7 of 8 -------------------------------------------------------------------------------- Patient/Caregiver Education Details Patient Name: Date of Service: [name] 10/31/2023andnbsp12:45 PM Medical Record Number: [id number] Patient Account Number: [id number] Date of Birth/Gender: Treating RN: 05/22/1951 (72 y.o. [name] Primary Care Physician: Deland Pretty Other Clinician: Referring Physician: Treating Physician/Extender: Judie Grieve in Treatment: 30 Education Assessment Education Provided To: Patient Education Topics Provided Pain: Handouts: A Guide to Pain Control Methods: Explain/Verbal Responses: Reinforcements needed Electronic Signature(s) Signed: 06/22/2022 10:46:15 AM By: Deon Pilling RN, BSN Entered By: Deon Pilling on 06/21/2022 13:36:21 -------------------------------------------------------------------------------- Wound Assessment Details Patient Name: Date of Service: [name]. 06/21/2022 12:45 PM Medical Record Number: [id number] Patient Account Number: [id number] Date of Birth/Sex: Treating RN: 1950/10/25 (72 y.o. [name], [name] Primary Care Jerilee Space: Deland Pretty Other Clinician: Referring Kahlee Metivier: Treating Ryker Sudbury/Extender: Judie Grieve in Treatment: 30 Wound Status Wound Number: 1 Primary Etiology: Trauma, Other Wound Location: Left, Lateral Lower Leg Wound Status: Open Wounding Event: Trauma Comorbid History: Congestive Heart Failure, Hypertension Date Acquired: 10/18/2021 Weeks Of Treatment: 30 Clustered Wound: Yes Photos Wound Measurements Length: (cm) VEDHA, TERCERO ([id number]) Width: (cm) Depth: (cm) Clustered Quantity: Area: (cm) Volume: (cm) 0.5 % Reduction in Area: 99.9% [id number] Page 8 of 8 0.5 % Reduction in Volume: 100% 0.5 Epithelialization: Large (67-100%) 1 Tunneling: Yes 0.196 Position (o'clock): 11 0.098 Maximum Distance: (cm) 3.3 Undermining: No Wound Description Classification: Full Thickness With Exposed Support Structures Wound Margin: Distinct, outline attached Exudate Amount: Medium Exudate Type: Serosanguineous Exudate Color: red, brown Foul Odor After Cleansing: No Slough/Fibrino No Wound Bed Granulation Amount: Large (67-100%) Exposed Structure Granulation Quality: Red, Friable Fascia Exposed: No Necrotic Amount: None Present  (0%) Fat Layer (Subcutaneous Tissue) Exposed: Yes Tendon Exposed: No Muscle Exposed: No Joint Exposed: No Bone Exposed: No Periwound Skin Texture Texture Color No Abnormalities Noted: No No Abnormalities Noted: No Callus: No Atrophie Blanche: No Crepitus: No Cyanosis: No Excoriation: No Ecchymosis: No Induration: Yes Erythema: No Rash: No Hemosiderin Staining: Yes Scarring: No Mottled: No Pallor: No Moisture Rubor: No No Abnormalities Noted: No Dry / Scaly: No Temperature / Pain Maceration: No Temperature: No Abnormality Tenderness on Palpation: Yes Electronic Signature(s) Signed: 06/22/2022 10:46:15 AM By: Deon Pilling RN, BSN Signed: 08/24/2022 5:36:43 PM By: Rhae Hammock RN Entered By: Deon Pilling on 06/21/2022 13:35:52 -------------------------------------------------------------------------------- Vitals Details Patient Name: Date of Service: [name]. 06/21/2022 12:45 PM Medical Record Number: [id number] Patient Account Number: [id number] Date of Birth/Sex: Treating RN: May 30, 1951 (72 y.o. [name], [name] Primary Care Cinque Begley: Deland Pretty Other Clinician: Referring Hilja Kintzel: Treating Elfreida Heggs/Extender: Judie Grieve in Treatment: 30 Vital Signs Time Taken: 13:00 Temperature (F): 97.8 Height (in): 67 Pulse (bpm): 47 Weight (lbs): 340 Respiratory Rate (breaths/min): 17 Body Mass Index (BMI): 53.2 Blood Pressure (mmHg): 157/74 Reference Range: 80 - 120 mg / dl Electronic Signature(s) Signed: 08/24/2022 5:36:43 PM By: Rhae Hammock RN Entered By: Rhae Hammock on 06/21/2022 13:00:20

## 2022-08-26 DIAGNOSIS — I110 Hypertensive heart disease with heart failure: Secondary | ICD-10-CM | POA: Diagnosis not present

## 2022-08-26 DIAGNOSIS — Z9049 Acquired absence of other specified parts of digestive tract: Secondary | ICD-10-CM | POA: Diagnosis not present

## 2022-08-26 DIAGNOSIS — D500 Iron deficiency anemia secondary to blood loss (chronic): Secondary | ICD-10-CM | POA: Diagnosis not present

## 2022-08-26 DIAGNOSIS — Z9181 History of falling: Secondary | ICD-10-CM | POA: Diagnosis not present

## 2022-08-26 DIAGNOSIS — Z6841 Body Mass Index (BMI) 40.0 and over, adult: Secondary | ICD-10-CM | POA: Diagnosis not present

## 2022-08-26 DIAGNOSIS — J309 Allergic rhinitis, unspecified: Secondary | ICD-10-CM | POA: Diagnosis not present

## 2022-08-26 DIAGNOSIS — E039 Hypothyroidism, unspecified: Secondary | ICD-10-CM | POA: Diagnosis not present

## 2022-08-26 DIAGNOSIS — Z7901 Long term (current) use of anticoagulants: Secondary | ICD-10-CM | POA: Diagnosis not present

## 2022-08-26 DIAGNOSIS — I5032 Chronic diastolic (congestive) heart failure: Secondary | ICD-10-CM | POA: Diagnosis not present

## 2022-08-26 DIAGNOSIS — T24332D Burn of third degree of left lower leg, subsequent encounter: Secondary | ICD-10-CM | POA: Diagnosis not present

## 2022-08-26 DIAGNOSIS — Z96653 Presence of artificial knee joint, bilateral: Secondary | ICD-10-CM | POA: Diagnosis not present

## 2022-08-26 DIAGNOSIS — F325 Major depressive disorder, single episode, in full remission: Secondary | ICD-10-CM | POA: Diagnosis not present

## 2022-08-26 DIAGNOSIS — I4891 Unspecified atrial fibrillation: Secondary | ICD-10-CM | POA: Diagnosis not present

## 2022-08-26 NOTE — Progress Notes (Signed)
[name] [name] ([id number]) [id number] Page 1 of 9 Visit Report for 08/25/2022 Arrival Information Details Patient Name: Date of Service: [name] [name] 08/25/2022 2:15 PM Medical Record Number: [id number] Patient Account Number: [id number] Date of Birth/Sex: Treating RN: 04/14/1951 (72 y.o. F) Primary Care [name] [name]: [name] [name] Other Clinician: Referring [name] [name]: Treating [name] [name]/Extender: [name] [name] in Treatment: 39 Visit Information History Since Last Visit Added or deleted any medications: [name] Patient Arrived: Walker Any new allergies or adverse reactions: [name] Arrival Time: 14:10 Had a fall or experienced change in [name] Accompanied By: self activities of daily living that may affect Transfer Assistance: None risk of falls: Patient Identification Verified: Yes Signs or symptoms of abuse/neglect since last visito [name] Secondary Verification Process Completed: Yes Hospitalized since last visit: [name] Patient Requires Transmission-Based Precautions: [name] Implantable device outside of the clinic excluding [name] Patient Has Alerts: Yes cellular tissue based products placed in the center Patient Alerts: Patient on Blood Thinner since last visit: Has Dressing in Place as Prescribed: Yes Pain Present Now: [name] Electronic Signature(s) Signed: 08/25/2022 4:16:52 PM By: [name] [name] Entered By: [name] [name] on 08/25/2022 14:11:06 -------------------------------------------------------------------------------- Clinic Level of Care Assessment Details Patient Name: Date of Service: [name] [name] 08/25/2022 2:15 PM Medical Record Number: [id number] Patient Account Number: [id number] Date of Birth/Sex: Treating RN: 07-30-1951 (72 y.o. Ardis Rowan, [name] Primary Care [name] [name]: [name] [name] Other Clinician: Referring [name] [name]: Treating [name] [name]/Extender: [name] [name] in Treatment: 39 Clinic Level of Care  Assessment Items TOOL 4 Quantity Score X- 1 0 Use when only an EandM is performed on FOLLOW-UP visit ASSESSMENTS - Nursing Assessment / Reassessment X- 1 10 Reassessment of Co-morbidities (includes updates in patient status) X- 1 5 Reassessment of Adherence to Treatment Plan ASSESSMENTS - Wound and Skin A ssessment / Reassessment X - Simple Wound Assessment / Reassessment - one wound 1 5 []  - 0 Complex Wound Assessment / Reassessment - multiple wounds []  - 0 Dermatologic / Skin Assessment (not related to wound area) ASSESSMENTS - Focused Assessment X- 1 5 Circumferential Edema Measurements - multi extremities []  - 0 Nutritional Assessment / Counseling / Intervention [name] [name], [name] [name] ( ) [id number] Page 2 of 9 []  - 0 Lower Extremity Assessment (monofilament, tuning fork, pulses) []  - 0 Peripheral Arterial Disease Assessment (using hand held doppler) ASSESSMENTS - Ostomy and/or Continence Assessment and Care []  - 0 Incontinence Assessment and Management []  - 0 Ostomy Care Assessment and Management (repouching, etc.) PROCESS - Coordination of Care X - Simple Patient / Family Education for ongoing care 1 15 []  - 0 Complex (extensive) Patient / Family Education for ongoing care X- 1 10 Staff obtains [name] [name], Records, T Results / Process Orders est []  - 0 Staff telephones HHA, Nursing Homes / Clarify orders / etc []  - 0 Routine Transfer to another Facility (non-emergent condition) []  - 0 Routine Hospital Admission (non-emergent condition) []  - 0 New Admissions / [id number] / Ordering NPWT Apligraf, etc. , []  - 0 Emergency Hospital Admission (emergent condition) X- 1 10 Simple Discharge Coordination []  - 0 Complex (extensive) Discharge Coordination PROCESS - Special Needs []  - 0 Pediatric / Minor Patient Management []  - 0 Isolation Patient Management []  - 0 Hearing / Language / Visual special needs []  -  0 Assessment of Community assistance (transportation, D/C planning, etc.) []  - 0 Additional assistance / Altered mentation []  - 0 Support Surface(s) Assessment (bed, cushion, seat, etc.) INTERVENTIONS - Wound Cleansing / Measurement X -  Simple Wound Cleansing - one wound 1 5 []  - 0 Complex Wound Cleansing - multiple wounds X- 1 5 Wound Imaging (photographs - any number of wounds) []  - 0 Wound Tracing (instead of photographs) X- 1 5 Simple Wound Measurement - one wound []  - 0 Complex Wound Measurement - multiple wounds INTERVENTIONS - Wound Dressings []  - 0 Small Wound Dressing one or multiple wounds X- 1 15 Medium Wound Dressing one or multiple wounds []  - 0 Large Wound Dressing one or multiple wounds X- 1 5 Application of Medications - topical []  - 0 Application of Medications - injection INTERVENTIONS - Miscellaneous []  - 0 External ear exam []  - 0 Specimen Collection (cultures, biopsies, blood, body fluids, etc.) []  - 0 Specimen(s) / Culture(s) sent or taken to Lab for analysis []  - 0 Patient Transfer (multiple staff / / Similar devices) []  - 0 Simple Staple / Suture removal (25 or less) []  - 0 Complex Staple / Suture removal (26 or more) []  - 0 Hypo / Hyperglycemic Management (close monitor of Blood Glucose) [name] [name], [name] [name] ( ) [id number] Page 3 of 9 []  - 0 Ankle / Brachial Index (ABI) - do not check if billed separately X- 1 5 Vital Signs Has the patient been seen at the hospital within the last three years: Yes Total Score: 100 Level Of Care: New/Established - Level 3 Electronic Signature(s) Signed: 08/26/2022 12:14:03 PM By: RN Entered By: on 08/25/2022 15:18:27 -------------------------------------------------------------------------------- Encounter Discharge Information Details Patient Name: Date of Service: J. 08/25/2022 2:15 PM Medical Record  Number: Nurse, adult Patient Account Number: Date of Birth/Sex: Treating RN: 1951/08/06 (72 y.o. [name] [name], [name] Primary Care [name] [name]: [id number] Other Clinician: Referring Mckenley Birenbaum: Treating Brooke Steinhilber/Extender: 08-11-2006 in Treatment: 39 Encounter Discharge Information Items Discharge Condition: Stable Ambulatory Status: Walker Discharge Destination: Home Transportation: Private Auto Accompanied By: self Schedule Follow-up Appointment: Yes Clinical Summary of Care: Patient Declined Electronic Signature(s) Signed: 08/26/2022 12:14:03 PM By: Fonnie Mu RN Entered By: Fonnie Mu on 08/25/2022 15:19:07 -------------------------------------------------------------------------------- Lower Extremity Assessment Details Patient Name: Date of Service: [name] Dupes. 08/25/2022 2:15 PM Medical Record Number: [id number] Patient Account Number: [id number] Date of Birth/Sex: Treating RN: 16-Nov-1950 (72 y.o. F) Primary Care Shriley Joffe: Ardis Rowan Other Clinician: Referring Deaundre Allston: Treating Yetzali Weld/Extender: [name] [name] in Treatment: 39 Edema Assessment Assessed: [Left: [name]] [Right: [name]] Edema: [Left: Ye] [Right: s] Calf Left: Right: Point of Measurement: 31 cm From Medial Instep 48.4 cm Ankle Left: Right: Point of Measurement: 9 cm From Medial Instep 26 cm Electronic Signature(s) Signed: 08/25/2022 4:16:52 PM By: 40, Aolanis J 4:16:52 PM By: 10/25/2022 Signed: 08/25/2022 (Fonnie Mu03/11/2022.pdf Page 4 of 9 Entered By: [name] [name] on 08/25/2022 14:18:59 -------------------------------------------------------------------------------- Multi Wound Chart Details Patient Name: Date of Service: [id number] 08/25/2022 2:15 PM Medical Record Number: 02/20/1951 Patient Account Number: [id number] Date of Birth/Sex: Treating RN: 02-10-1951 (72 y.o.  F) Primary Care Deanne Bedgood: 10/24/2022 Other Clinician: Referring Emre Stock: Treating Raigen Jagielski/Extender: Christene Lye in Treatment: 39 Vital Signs Height(in): 67 Pulse(bpm): 51 Weight(lbs): 340 Blood Pressure(mmHg): 160/83 Body Mass Index(BMI): 53.2 Temperature(F): 98.8 Respiratory Rate(breaths/min): 18 [1:Photos:] [N/A:N/A] Left, Lateral Lower Leg N/A N/A Wound Location: Trauma N/A N/A Wounding Event: Trauma, Other N/A N/A Primary Etiology: Congestive Heart Failure, N/A N/A Comorbid History: Hypertension 10/18/2021 N/A N/A Date Acquired: 17 N/A N/A Weeks of Treatment: Open N/A N/A Wound  Status: [name] N/A N/A Wound Recurrence: Yes N/A N/A Clustered Wound: 1 N/A N/A Clustered Quantity: 0.3x0.3x0.3 N/A N/A Measurements L x W x D (cm) 0.071 N/A N/A A (cm) : rea 0.021 N/A N/A Volume (cm) : 100.00% N/A N/A % Reduction in A rea: 100.00% N/A N/A % Reduction in Volume: 12 Position 1 (o'clock): 2 Maximum Distance 1 (cm): Yes N/A N/A Tunneling: Full Thickness With Exposed Support N/A N/A Classification: Structures Medium N/A N/A Exudate Amount: Serosanguineous N/A N/A Exudate Type: red, brown N/A N/A Exudate Color: Distinct, outline attached N/A N/A Wound Margin: Large (67-100%) N/A N/A Granulation Amount: Red N/A N/A Granulation Quality: None Present (0%) N/A N/A Necrotic Amount: Fat Layer (Subcutaneous Tissue): Yes N/A N/A Exposed Structures: Fascia: [name] Tendon: [name] Muscle: [name] Joint: [name] Bone: [name] Large (67-100%) N/A N/A Epithelialization: Induration: Yes N/A N/A Periwound Skin Texture: Excoriation: [name] Callus: [name] Crepitus: [name] Rash: [name] Scarring: [name] Dry/Scaly: Yes N/A N/A Periwound Skin Moisture: Maceration: [name] [name] [name], [name] [name] ([id number]) [id number] Page 5 of 9 Hemosiderin Staining: Yes N/A N/A Periwound Skin Color: Atrophie Blanche: [name] Cyanosis: [name] Ecchymosis: [name] Erythema:  [name] Mottled: [name] Pallor: [name] Rubor: [name] [name] Abnormality N/A N/A Temperature: Yes N/A N/A Tenderness on Palpation: Treatment Notes Electronic Signature(s) Signed: 08/25/2022 2:54:58 PM By: Geralyn Corwin DO Entered By: Geralyn Corwin on 08/25/2022 14:38:06 -------------------------------------------------------------------------------- Multi-Disciplinary Care Plan Details Patient Name: Date of Service: [name] [name]. 08/25/2022 2:15 PM Medical Record Number: [id number] Patient Account Number: [id number] Date of Birth/Sex: Treating RN: 1951/03/24 (72 y.o. Ardis Rowan, [name] Primary Care Autumnrose Yore: [name] [name] Other Clinician: Referring Marylin Lathon: Treating Kaja Jackowski/Extender: [name] [name] in Treatment: 29 Active Inactive Abuse / Safety / Falls / Self Care Management Nursing Diagnoses: History of Falls Potential for falls Goals: Patient/caregiver will verbalize/demonstrate measure taken to improve self care Date Initiated: 11/19/2021 Date Inactivated: 04/05/2022 Target Resolution Date: 04/21/2022 Goal Status: Met Patient/caregiver will verbalize/demonstrate measures taken to prevent injury and/or falls Date Initiated: 11/19/2021 Target Resolution Date: 09/22/2022 Goal Status: Active Interventions: Provide education on basic hygiene Provide education on fall prevention Provide education on HBO safety Provide education on vaccinations Treatment Activities: Education provided on Basic Hygiene : 05/17/2022 Notes: Pain, Acute or Chronic Nursing Diagnoses: Pain, acute or chronic: actual or potential Potential alteration in comfort, pain Goals: Patient will verbalize adequate pain control and receive pain control interventions during procedures as needed Date Initiated: 11/19/2021 Date Inactivated: 04/05/2022 Target Resolution Date: 04/22/2022 Goal Status: Met Patient/caregiver will verbalize comfort level met Date Initiated: 11/19/2021 Target Resolution  Date: 09/15/2022 Goal Status: Active [name] [name], [name] [name] ([id number]) [id number] Page 6 of 9 Interventions: Encourage patient to take pain medications as prescribed Provide education on pain management Reposition patient for comfort Treatment Activities: Administer pain control measures as ordered : 11/19/2021 Notes: Electronic Signature(s) Signed: 08/26/2022 12:14:03 PM By: Fonnie Mu RN Entered By: Fonnie Mu on 08/25/2022 14:11:07 -------------------------------------------------------------------------------- Pain Assessment Details Patient Name: Date of Service: [name] [name]. 08/25/2022 2:15 PM Medical Record Number: [id number] Patient Account Number: [id number] Date of Birth/Sex: Treating RN: [birth year] (72 y.o. F) Primary Care Annaelle Kasel: [name] [name] Other Clinician: Referring Braelin Brosch: Treating Graceland Wachter/Extender: [name] [name] in Treatment: 39 Active Problems Location of Pain Severity and Description of Pain Patient Has Paino [name] Site Locations Pain Management and Medication Current Pain Management: Electronic Signature(s) Signed: 08/25/2022 4:16:52 PM By: [name] [name] Entered By: [name] [name] on 08/25/2022 14:18:35 -------------------------------------------------------------------------------- Patient/Caregiver Education Details Patient Name: Date of Service: [name] [name],  [name] [name] 1/4/2024andnbsp2:15 PM Medical Record Number: [id number] Patient Account Number: [id number] [name] [name], [name] [name] ([id number]) [phone].pdf Page 7 of 9 Date of Birth/Gender: Treating RN: [birth year] (72 y.o. Benjaman Lobe Primary Care Physician: Deland Pretty Other Clinician: Referring Physician: Treating Physician/Extender: Judie Grieve in Treatment: 18 Education Assessment Education Provided To: Patient Education Topics Provided Wound/Skin Impairment: Methods:  Explain/Verbal Responses: Reinforcements needed, State content correctly [employer]) Signed: 08/26/2022 12:14:03 PM By: Rhae Hammock RN Entered By: Rhae Hammock on 08/25/2022 14:19:31 -------------------------------------------------------------------------------- Wound Assessment Details Patient Name: Date of Service: [name] [name]. 08/25/2022 2:15 PM Medical Record Number: [id number] Patient Account Number: [id number] Date of Birth/Sex: Treating RN: 1951/07/08 (72 y.o. F) Primary Care Micah Galeno: Deland Pretty Other Clinician: Referring Barth Trella: Treating Sukaina Toothaker/Extender: Judie Grieve in Treatment: 39 Wound Status Wound Number: 1 Primary Etiology: Trauma, Other Wound Location: Left, Lateral Lower Leg Wound Status: Open Wounding Event: Trauma Comorbid History: Congestive Heart Failure, Hypertension Date Acquired: 10/18/2021 Weeks Of Treatment: 39 Clustered Wound: Yes Photos Wound Measurements Length: (cm) Width: (cm) Depth: (cm) Clustered Quantity: Area: (cm) Volume: (cm) 0.3 % Reduction in Area: 100% 0.3 % Reduction in Volume: 100% 0.3 Epithelialization: Large (67-100%) 1 Tunneling: Yes 0.071 Position (o'clock): 12 0.021 Maximum Distance: (cm) 1.8 Wound Description Classification: Full Thickness With Exposed Support Structures Wound Margin: Distinct, outline attached Exudate Amount: Medium [name] [name], [name] [name] ([id number]) Exudate Type: Serosanguineous Exudate Color: red, brown Foul Odor After Cleansing: [name] Slough/Fibrino [name] [id number] Page 8 of 9 Wound Bed Granulation Amount: Large (67-100%) Exposed Structure Granulation Quality: Red Fascia Exposed: [name] Necrotic Amount: None Present (0%) Fat Layer (Subcutaneous Tissue) Exposed: Yes Tendon Exposed: [name] Muscle Exposed: [name] Joint Exposed: [name] Bone Exposed: [name] Periwound Skin Texture Texture Color [name] Abnormalities Noted: [name] [name]  Abnormalities Noted: [name] Callus: [name] Atrophie Blanche: [name] Crepitus: [name] Cyanosis: [name] Excoriation: [name] Ecchymosis: [name] Induration: Yes Erythema: [name] Rash: [name] Hemosiderin Staining: Yes Scarring: [name] Mottled: [name] Pallor: [name] Moisture Rubor: [name] [name] Abnormalities Noted: [name] Dry / Scaly: Yes Temperature / Pain Maceration: [name] Temperature: [name] Abnormality Tenderness on Palpation: Yes Treatment Notes Wound #1 (Lower Leg) Wound Laterality: Left, Lateral Cleanser Wound Cleanser Discharge Instruction: Cleanse the wound with wound cleanser prior to applying a clean dressing using gauze sponges, not tissue or cotton balls. Peri-Wound Care Ketoconazole Cream 2% Discharge Instruction: Apply to periwound in clinic. you use clobetasol at home. Topical keystone Discharge Instruction: Soak collagen in the [place] and pack into wound. Collage may be cut into thin strip. Primary Dressing Promogran Prisma Matrix, 4.34 (sq in) (silver collagen) Discharge Instruction: Moisten collagen with saline or hydrogel Plain packing strip 1/4 (in) Discharge Instruction: Lightly pack a small amount behind the collagen. leave a tail out of the wound to remove. Compounding topical antibiotics Discharge Instruction: apply antibiotic moisted gauze lightly packed into wound bed. Secondary Dressing ABD Pad, 5x9 Discharge Instruction: Apply over primary dressing as directed. Woven Gauze Sponge, Non-Sterile 4x4 in Discharge Instruction: Apply over primary dressing as directed. Secured With [employer], 4.5x3.1 (in/yd) Discharge Instruction: Secure with Kerlix as directed. 64M Medipore H Soft Cloth Surgical T ape, 4 x 10 (in/yd) Discharge Instruction: Secure with tape as directed. Compression Wrap ace wrap Discharge Instruction: apply to leg and knee. Compression Stockings Add-Ons Electronic Signature(s) [name] [name], [name] [name] ([id number]) [id number] Page 9 of 9 Signed: 08/25/2022 2:54:58  PM By: Kalman Shan DO Entered By: Kalman Shan on 08/25/2022 14:44:12 -------------------------------------------------------------------------------- Vitals Details Patient Name: Date of  Service: [name] [name] 08/25/2022 2:15 PM Medical Record Number: [id number] Patient Account Number: [id number] Date of Birth/Sex: Treating RN: 01/22/1951 (72 y.o. F) Primary Care Stephanny Tsutsui: Deland Pretty Other Clinician: Referring Diahann Guajardo: Treating Cesily Cuoco/Extender: Judie Grieve in Treatment: 39 Vital Signs Time Taken: 14:11 Temperature (F): 98.8 Height (in): 67 Pulse (bpm): 51 Weight (lbs): 340 Respiratory Rate (breaths/min): 18 Body Mass Index (BMI): 53.2 Blood Pressure (mmHg): 160/83 Reference Range: 80 - 120 mg / dl Electronic Signature(s) Signed: 08/25/2022 4:16:52 PM By: Erenest Blank Entered By: Erenest Blank on 08/25/2022 14:22:11

## 2022-09-01 ENCOUNTER — Encounter (HOSPITAL_BASED_OUTPATIENT_CLINIC_OR_DEPARTMENT_OTHER): Payer: PPO | Admitting: Internal Medicine

## 2022-09-01 DIAGNOSIS — T798XXA Other early complications of trauma, initial encounter: Secondary | ICD-10-CM | POA: Diagnosis not present

## 2022-09-01 DIAGNOSIS — L97822 Non-pressure chronic ulcer of other part of left lower leg with fat layer exposed: Secondary | ICD-10-CM | POA: Diagnosis not present

## 2022-09-01 DIAGNOSIS — I87312 Chronic venous hypertension (idiopathic) with ulcer of left lower extremity: Secondary | ICD-10-CM | POA: Diagnosis not present

## 2022-09-01 DIAGNOSIS — I890 Lymphedema, not elsewhere classified: Secondary | ICD-10-CM

## 2022-09-01 NOTE — Progress Notes (Signed)
Christine [name] ([id number]) [id number] Page 1 of 10 Visit Report for 09/01/2022 Chief Complaint Document Details Patient Name: Date of Service: Christine [name] 09/01/2022 10:30 A M Medical Record Number: [id number] Patient Account Number: [id number] Date of Birth/Sex: Treating RN: 02-25-1951 (72 y.o. F) Primary Care Provider: [name] Other Clinician: Referring Provider: Treating Provider/Extender: Annamary Rummage in Treatment: 40 Information Obtained from: Patient Chief Complaint 11/19/2021; Left lower extremity wound status post fall Electronic Signature(s) Signed: 09/01/2022 12:04:27 PM By: Geralyn Corwin DO Entered By: Geralyn Corwin on 09/01/2022 11:05:11 -------------------------------------------------------------------------------- HPI Details Patient Name: Date of Service: Christine [name], Christine [name] 09/01/2022 10:30 A M Medical Record Number: [id number] Patient Account Number: [id number] Date of Birth/Sex: Treating RN: 05-21-1951 (72 y.o. F) Primary Care Provider: [name] Other Clinician: Referring Provider: Treating Provider/Extender: Annamary Rummage in Treatment: 40 History of Present Illness HPI Description: Admission 11/19/2021 Christine [name] is a 72 year old female with a past medical history of paroxysmal A-fib on Eliquis, hypothyroidism, major depressive disorder, venous insufficiency and chronic diastolic heart failure that presents to the clinic for a 1 month history of nonhealing wound to the left lower extremity. She visited the ED on 10/18/2021 after a mechanical fall. She developed a hematoma that subsequently opened. She was hospitalized for 7 days and discharged on 10/25/2021. She has been on several different antibiotics For the past month. She states that most recently she was on Levaquin and linezolid for the past week. She states she completes her antibiotic course  tomorrow. She has been using Dakin's wet-to-dry dressings to the wound bed. She denies signs of infection. 4/7; patient presents for follow-up. She has been using Dakin's wet-to-dry dressings. She did end up going to the ED on 4/1 because she had excess bleeding with dressing change that she could not stop. She is on Eliquis for A-fib. In the ED they tied off a small artery. She has had no issues since discharge. She denies signs of infection. 4/14; this is a very difficult clinical situation. A patient with underlying chronic venous insufficiency and lymphedema very significant lower extremity edema had a hematoma after a fall on her left upper lateral lower leg. She is on Eliquis for atrial fibrillation apparently with a history of a splenic infarct following with Dr. Berton Mount of cardiology. She has exhibited significant bleeding from the wound surface including ao Venous bleeder that required suturing short while ago. She has been using Dakin's wet-to-dry packing and over the surface of the wound. She saw Dr. Graciela Husbands yesterday he is reluctant to consider stopping the Eliquis because of the prior history of presumed cardioembolism. Wants to communicate with Dr. Mikey Bussing when she returns. In a perfect world where she was not on Eliquis she requires a wound VAC with additional compression wraps [name] I understand the reluctance to do this because of the concerns of bleeding 4/28; the patient's wound actually looks better today using Dakin's wet-to-dry that she is changing twice a day she is wrapping this with Kerlix and Ace wrapping. She tells me she had 2 small bleeding areas which were part of the superficial wound that stopped this week with direct pressure. Other than that no major issues. In follow-up from discussion of last week Dr. 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  The bigger question from my point of view is the wound VAC issue. As far as she knows and her daughter-in-law verifies that she has not had any bleeding from the deeper parts of the wound although the bleeding has been superficial including the one that sent her to the ER for stitches. She is concerned that a wound VAC would cause further bleeding and I cannot completely allay those concerns. [name], Christine [name] ([id number]) [id number] Page 2 of 10 5/8; patient presents for follow-up. She has no issues or complaints today. She has been using Dakin's wet-to-dry dressings without issues. She denies signs of infection. 5/12; patient presents for follow-up. She continues to use Dakin's wet-to-dry dressings without issues. She denies signs of infection. She reports some issues with bleeding at times [name] this has improved. She denies signs of infection. 6/1; patient presents for follow-up. She was recently hospitalized for upper left leg thigh cellulitis. She was given IV cefepime and vancomycin and discharged on oral antibiotics. She has been using Dakin's wet-to-dry dressings to the left lower leg wound. She reports improvement in healing. She currently denies systemic signs of infection. 6/7; patient is using Dakin's wet-to-dry twice daily. In general this looks better than when I saw this a month or so ago however still considerable depth to the tunnel in her left leg. She is going for iron infusions ordered by her primary care doctor I believe 6/15; patient presents for follow-up. She has been using Dakin's wet-to-dry dressings. She has noted some scattered small areas that blister up and heal on her left lower leg. She has been using mupirocin ointment on them. Nothing open today. 6/23; patient's been using Dakin's wet-to-dry dressings to the tunneled wound and Hydrofera Blue to the opening. She has no issues or complaints  today. 6/29; patient presents for follow-up. She has been using Dakin's wet-to-dry dressings to the tunneled wound and Hydrofera Blue to the opening. She states that [facility] is sticking to the wound bed. She denies signs of infection. 7/7; patient presents for follow-up. She has been using Dakin's wet-to-dry dressings to the tunneled wound and PolyMem silver to the opening without issues. 7/13; patient presents for follow-up. She has been using PolyMem silver to the opening and the rope to the tunnel. She has no issues or complaints today. She denies signs of infection. 7/20; patient presents for follow-up. We have been using PolyMem silver to the wound bed. She has no issues or complaints today. She is receiving her custom compression garments tomorrow. 7/27; patient presents for follow-up. She has been using PolyMem silver to the wound bed. She is having her custom compression garments adjusted as these are not staying on very easily. 8/30; patient presents for follow-up. We have been using PolyMem silver to the wound bed. She is still waiting on her custom compression garments to be ordered. She denies signs of infection. 8/11; patient presents for follow-up. The wound VAC has been started and patient has been using this for the past week. Patient has home health who changes the wound VAC. She developed irritation to the periwound. DuoDERM was not being used to the periwound despite being ordered. 8/15; patient presents for follow-up. She restarted the wound VAC and has had improvement to the periwound with the use of DuoDERM. 8/24; patient presents for follow-up. She has been using the wound VAC. She has developed a wound just superior to the original wound. Likely as a result from the wound VAC. She denies signs of infection. 8/29;  patient presents for follow-up. We have been using collagen to the wound bed. She has held off on the wound VAC for the past week. 9/7; patient presents for  follow-up. We have been using iodoform packing to the wound bed. She has mild tenderness to the periwound. She denies systemic signs of infection. 9/12; patient presents for follow-up. Patient has been using Dakin's wet-to-dry dressings. She has no issues or complaints today. She denies signs of infection. 9/21; patient presents for follow-up. PuraPly #1 was placed in standard fashion at last clinic visit. She has no issues or complaints today. She denies signs of infection. 10/3; patient presents for follow-up. PuraPly #2 was placed in standard fashion at last clinic visit. She has no issues or complaints today. She denies signs of infection. 10/10; patient presents for follow-up. PuraPly #3 was placed in standard fashion at last clinic visit. She has no issues or complaints today. She denies signs of infection. 10/17; patient presents for follow-up. She has been using silver alginate to the tunneled wound. She has no issues or complaints today. 10/31; patient presents for follow-up. We have been doing Dakin's wet-to-dry dressings to the tunneled wound. The wound VAC has been ordered however patient has not received this. She currently denies signs of infection. She reports a lot of serosanguineous drainage. 11/14; patient presents for follow-up. She had a PCR culture done at last clinic visit that grew Pseudomonas aeruginosa. She obtained Keystone antibiotic spray yesterday. She also obtained her ultrasound of the left lower extremity to assess for abscess however the vascular lab ordered On erroneous test. This has been rectified. Patient was not charged for the test. DVT study showed no DVT T also reported to our nurse that they did not notice any abscess s. Aletha Halim while doing the DVT rule out. 11/21; patient presents for follow-up. She has been using Keystone antibiotic with gauze packing. She has no issues or complaints today. 11/30; patient presents for follow-up. She has been using Keystone  antibiotic with gauze packing. Depth is slightly larger today. She denies signs of infection. 12/5; patient presents for follow-up. She has been using Keystone antibiotic spray with collagen. She has no issues or complaints today. She has been using an Ace wrap for compression therapy. 12/12; patient presents for follow-up. She has been using Keystone antibiotic spray with collagen and Ace wrap for compression. She states that the Ace wrap falls down during the day. She is only able to use this at night. 12/19; patient presents for follow-up. She has been using Keystone antibiotic spray with collagen. She is unable to use an Ace wrap during the day because she states it falls down however she is able to use it at night without issues. She denies signs of infection. 12/26; patient presents for follow-up. She has been using Keystone antibiotic ointment with collagen. She uses an Ace wrap to help with compression at night. She has no issues or complaints today. 1/4; patient presents for follow-up. She is been using Keystone antibiotic with collagen. She uses the Ace wrap at night. She tries to use it in the daytime as well she has no issues or complaints today. 1/11; patient presents for follow-up. She has been using Keystone antibiotic ointment with collagen. She uses the Ace wrap at night T help with compression. o She has no issues or complaints today. Christine [name], Christine [name] ([id number]) [id number] Page 3 of 10 Electronic Signature(s) Signed: 09/01/2022 12:04:27 PM By: Kalman Shan DO Entered By: Kalman Shan on 09/01/2022 11:05:47 --------------------------------------------------------------------------------  Physical Exam Details Patient Name: Date of Service: Christine [name] 09/01/2022 10:30 A M Medical Record Number: [id number] Patient Account Number: [id number] Date of Birth/Sex: Treating RN: 1951-04-02 (72 y.o. F) Primary Care Provider: [name]  Other Clinician: Referring Provider: Treating Provider/Extender: Annamary Rummage in Treatment: 40 Constitutional respirations regular, non-labored and within target range for patient.. Cardiovascular 2+ dorsalis pedis/posterior tibialis pulses. Psychiatric pleasant and cooperative. Notes Left lower extremity: Open wound with granulation tissue at the opening with a depth of 1.8 cm with no increased warmth, erythema or purulent drainage. No tenderness on palpation. Uncontrolled lymphedema above the knee. Electronic Signature(s) Signed: 09/01/2022 12:04:27 PM By: Geralyn Corwin DO Entered By: Geralyn Corwin on 09/01/2022 11:06:23 -------------------------------------------------------------------------------- Physician Orders Details Patient Name: Date of Service: Christine Rolling [name] 09/01/2022 10:30 A M Medical Record Number: [id number] Patient Account Number: [id number] Date of Birth/Sex: Treating RN: [birth year] (72 y.o. Christine [name], Christine [name] Primary Care Provider: [name] Other Clinician: Referring Provider: Treating Provider/Extender: Annamary Rummage in Treatment: [phone] Verbal / Phone Orders: No Diagnosis Coding Follow-up Appointments ppointment in 2 weeks. - Dr. Marletta Lor the appt. next week and make one for 2 weeks from today Return A Other: - Continue Topical antibiotic ointment. use clobetasol antifungal at home around the wound. Anesthetic (In clinic) Topical Lidocaine 5% applied to wound bed Cellular or Tissue Based Products Wound #1 Left,Lateral Lower Leg Cellular or Tissue Based Product Type: - 05/12/2022 Puraply AM #1 applied. 05/17/22 PURAPLY AM # 2 05/24/2022 Puraply AM #3 05/31/2022 HOLD Edema Control - Lymphedema / SCD / Other Avoid standing for long periods of time. SMT., LODER ([id number]) [id number] Page 4 of 10 Moisturize legs daily. - both legs every night before  bed. Wound Treatment Wound #1 - Lower Leg Wound Laterality: Left, Lateral Cleanser: Wound Cleanser (Home Health) 1 x Per Day/30 Days Discharge Instructions: Cleanse the wound with wound cleanser prior to applying a clean dressing using gauze sponges, not tissue or cotton balls. Peri-Wound Care: Ketoconazole Cream 2% 1 x Per Day/30 Days Discharge Instructions: Apply to periwound in clinic. you use clobetasol at home. Topical: keystone 1 x Per Day/30 Days Discharge Instructions: Soak collagen in the keystone and pack into wound. Collage may be cut into thin strip. Prim Dressing: Promogran Prisma Matrix, 4.34 (sq in) (silver collagen) (Generic) 1 x Per Day/30 Days ary Discharge Instructions: Moisten collagen with saline or hydrogel Prim Dressing: Plain packing strip 1/4 (in) 1 x Per Day/30 Days ary Discharge Instructions: Lightly pack a small amount behind the collagen. leave a tail out of the wound to remove. Prim Dressing: Compounding topical antibiotics 1 x Per Day/30 Days ary Discharge Instructions: apply antibiotic moisted gauze lightly packed into wound bed. Secondary Dressing: ABD Pad, 5x9 1 x Per Day/30 Days Discharge Instructions: Apply over primary dressing as directed. Secondary Dressing: Woven Gauze Sponge, Non-Sterile 4x4 in (Home Health) 1 x Per Day/30 Days Discharge Instructions: Apply over primary dressing as directed. Secured With: [employer], 4.5x3.1 (in/yd) (Home Health) 1 x Per Day/30 Days Discharge Instructions: Secure with Kerlix as directed. Secured With: 64M Medipore H Soft Cloth Surgical T ape, 4 x 10 (in/yd) (Home Health) 1 x Per Day/30 Days Discharge Instructions: Secure with tape as directed. Compression Wrap: ace wrap (Home Health) 1 x Per Day/30 Days Discharge Instructions: apply to leg and knee. Electronic Signature(s) Signed: 09/01/2022 12:04:27 PM By: Geralyn Corwin DO Entered By: Geralyn Corwin on 09/01/2022  11:07:28 --------------------------------------------------------------------------------  Problem List Details Patient Name: Date of Service: Christine [name] 09/01/2022 10:30 A M Medical Record Number: [id number] Patient Account Number: [id number] Date of Birth/Sex: Treating RN: 17-May-1951 (72 y.o. 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/31/2023 No Yes T79.8XXA Other early complications of trauma, initial encounter 11/19/2021 No Yes I87.312 Chronic venous hypertension (idiopathic) with ulcer of left lower extremity 04/28/2022 No Yes Christine [name], Christine [name] ([id number]) [phone].pdf Page 5 of 10 I89.0 Lymphedema, not elsewhere classified 04/28/2022 No Yes I48.0 Paroxysmal atrial fibrillation 11/19/2021 No Yes Z79.01 Long term (current) use of anticoagulants 11/19/2021 No Yes Inactive Problems Resolved Problems Electronic Signature(s) Signed: 09/01/2022 12:04:27 PM By: Geralyn Corwin DO Entered By: Geralyn Corwin on 09/01/2022 11:04:57 -------------------------------------------------------------------------------- Progress Note Details Patient Name: Date of Service: Christine Rolling [name] 09/01/2022 10:30 A M Medical Record Number: [id number] Patient Account Number: [id number] Date of Birth/Sex: Treating RN: Jul 17, 1951 (72 y.o. F) Primary Care Provider: [name] Other Clinician: Referring Provider: Treating Provider/Extender: Annamary Rummage in Treatment: 40 Subjective Chief Complaint Information obtained from Patient 11/19/2021; Left lower extremity wound status post fall History of Present Illness (HPI) Admission 11/19/2021 Ms. Agata Lucente is  a 72 year old female with a past medical history of paroxysmal A-fib on Eliquis, hypothyroidism, major depressive disorder, venous insufficiency and chronic diastolic heart failure that presents to the clinic for a 1 month history of nonhealing wound to the left lower extremity. She visited the ED on 10/18/2021 after a mechanical fall. She developed a hematoma that subsequently opened. She was hospitalized for 7 days and discharged on 10/25/2021. She has been on several different antibiotics For the past month. She states that most recently she was on Levaquin and linezolid for the past week. She states she completes her antibiotic course tomorrow. She has been using Dakin's wet-to-dry dressings to the wound bed. She denies signs of infection. 4/7; patient presents for follow-up. She has been using Dakin's wet-to-dry dressings. She did end up going to the ED on 4/1 because she had excess bleeding with dressing change that she could not stop. She is on Eliquis for A-fib. In the ED they tied off a small artery. She has had no issues since discharge. She denies signs of infection. 4/14; this is a very difficult clinical situation. A patient with underlying chronic venous insufficiency and lymphedema very significant lower extremity edema had a hematoma after a fall on her left upper lateral lower leg. She is on Eliquis for atrial fibrillation apparently with a history of a splenic infarct following with Dr. Berton Mount of cardiology. She has exhibited significant bleeding from the wound surface including ao Venous bleeder that required suturing short while ago. She has been using Dakin's wet-to-dry packing and over the surface of the wound. She saw Dr. Graciela Husbands yesterday he is reluctant to consider stopping the Eliquis because of the prior history of presumed cardioembolism. Wants to communicate with Dr. Mikey Bussing when she returns. In a perfect world where she was not on Eliquis she requires a wound VAC with  additional compression wraps [name] I understand the reluctance to do this because of the concerns of bleeding 4/28; the patient's wound actually looks better today using Dakin's wet-to-dry that she is changing twice a day she is wrapping this with Kerlix  and Ace wrapping. She tells me she had 2 small bleeding areas which were part of the superficial wound that stopped this week with direct pressure. Other than that no major issues. In follow-up from discussion of last week Dr. 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 The bigger question from my point of view is the wound VAC issue. As far as she knows and her daughter-in-law verifies that she has not had any bleeding from the deeper parts of the wound although the bleeding has been superficial including the one that sent her to the ER for stitches. She is concerned that a wound VAC would cause further bleeding and I cannot completely allay those concerns. 5/8; patient presents for follow-up. She has no issues or complaints today. She has been using Dakin's wet-to-dry dressings without issues. She denies signs of infection. Christine [name], Christine [name] ([id number]) [id number] Page 6 of 10 5/12; patient presents for follow-up. She continues to use Dakin's wet-to-dry dressings without issues. She denies signs of infection. She reports some issues with bleeding at times [name] this has improved. She denies signs of infection. 6/1; patient presents for follow-up. She was recently hospitalized for upper left leg thigh cellulitis. She was given IV cefepime and vancomycin and discharged on oral antibiotics. She has been using Dakin's wet-to-dry dressings to the left lower leg wound. She reports improvement in healing. She currently denies systemic signs of infection. 6/7;  patient is using Dakin's wet-to-dry twice daily. In general this looks better than when I saw this a month or so ago however still considerable depth to the tunnel in her left leg. She is going for iron infusions ordered by her primary care doctor I believe 6/15; patient presents for follow-up. She has been using Dakin's wet-to-dry dressings. She has noted some scattered small areas that blister up and heal on her left lower leg. She has been using mupirocin ointment on them. Nothing open today. 6/23; patient's been using Dakin's wet-to-dry dressings to the tunneled wound and Hydrofera Blue to the opening. She has no issues or complaints today. 6/29; patient presents for follow-up. She has been using Dakin's wet-to-dry dressings to the tunneled wound and Hydrofera Blue to the opening. She states that [facility] is sticking to the wound bed. She denies signs of infection. 7/7; patient presents for follow-up. She has been using Dakin's wet-to-dry dressings to the tunneled wound and PolyMem silver to the opening without issues. 7/13; patient presents for follow-up. She has been using PolyMem silver to the opening and the rope to the tunnel. She has no issues or complaints today. She denies signs of infection. 7/20; patient presents for follow-up. We have been using PolyMem silver to the wound bed. She has no issues or complaints today. She is receiving her custom compression garments tomorrow. 7/27; patient presents for follow-up. She has been using PolyMem silver to the wound bed. She is having her custom compression garments adjusted as these are not staying on very easily. 8/30; patient presents for follow-up. We have been using PolyMem silver to the wound bed. She is still waiting on her custom compression garments to be ordered. She denies signs of infection. 8/11; patient presents for follow-up. The wound VAC has been started and patient has been using this for the past week. Patient has home  health who changes the  wound VAC. She developed irritation to the periwound. DuoDERM was not being used to the periwound despite being ordered. 8/15; patient presents for follow-up. She restarted the wound VAC and has had improvement to the periwound with the use of DuoDERM. 8/24; patient presents for follow-up. She has been using the wound VAC. She has developed a wound just superior to the original wound. Likely as a result from the wound VAC. She denies signs of infection. 8/29; patient presents for follow-up. We have been using collagen to the wound bed. She has held off on the wound VAC for the past week. 9/7; patient presents for follow-up. We have been using iodoform packing to the wound bed. She has mild tenderness to the periwound. She denies systemic signs of infection. 9/12; patient presents for follow-up. Patient has been using Dakin's wet-to-dry dressings. She has no issues or complaints today. She denies signs of infection. 9/21; patient presents for follow-up. PuraPly #1 was placed in standard fashion at last clinic visit. She has no issues or complaints today. She denies signs of infection. 10/3; patient presents for follow-up. PuraPly #2 was placed in standard fashion at last clinic visit. She has no issues or complaints today. She denies signs of infection. 10/10; patient presents for follow-up. PuraPly #3 was placed in standard fashion at last clinic visit. She has no issues or complaints today. She denies signs of infection. 10/17; patient presents for follow-up. She has been using silver alginate to the tunneled wound. She has no issues or complaints today. 10/31; patient presents for follow-up. We have been doing Dakin's wet-to-dry dressings to the tunneled wound. The wound VAC has been ordered however patient has not received this. She currently denies signs of infection. She reports a lot of serosanguineous drainage. 11/14; patient presents for follow-up. She had a PCR  culture done at last clinic visit that grew Pseudomonas aeruginosa. She obtained Keystone antibiotic spray yesterday. She also obtained her ultrasound of the left lower extremity to assess for abscess however the vascular lab ordered On erroneous test. This has been rectified. Patient was not charged for the test. DVT study showed no DVT T also reported to our nurse that they did not notice any abscess s. Christine Butterech while doing the DVT rule out. 11/21; patient presents for follow-up. She has been using Keystone antibiotic with gauze packing. She has no issues or complaints today. 11/30; patient presents for follow-up. She has been using Keystone antibiotic with gauze packing. Depth is slightly larger today. She denies signs of infection. 12/5; patient presents for follow-up. She has been using Keystone antibiotic spray with collagen. She has no issues or complaints today. She has been using an Ace wrap for compression therapy. 12/12; patient presents for follow-up. She has been using Keystone antibiotic spray with collagen and Ace wrap for compression. She states that the Ace wrap falls down during the day. She is only able to use this at night. 12/19; patient presents for follow-up. She has been using Keystone antibiotic spray with collagen. She is unable to use an Ace wrap during the day because she states it falls down however she is able to use it at night without issues. She denies signs of infection. 12/26; patient presents for follow-up. She has been using Keystone antibiotic ointment with collagen. She uses an Ace wrap to help with compression at night. She has no issues or complaints today. 1/4; patient presents for follow-up. She is been using Keystone antibiotic with collagen. She uses the Ace wrap at  night. She tries to use it in the daytime as well she has no issues or complaints today. 1/11; patient presents for follow-up. She has been using Keystone antibiotic ointment with collagen. She  uses the Ace wrap at night T help with compression. o She has no issues or complaints today. Patient History Medical History Cardiovascular Christine [name], Christine [name] ([id number]) [id number] Page 7 of 10 Patient has history of Congestive Heart Failure, Hypertension Hospitalization/Surgery History - Cellulitis left leg- 01/03/2022-01/07/2022. Medical A Surgical History Notes nd Constitutional Symptoms (General Health) Infarction of spleen Hematologic/Lymphatic Hypothyroidism Cardiovascular A-Fib Gastrointestinal Gastroesophageal reflux Genitourinary Chronic kidney disease Musculoskeletal Osteoarthritis of knee Psychiatric Anxiety Objective Constitutional respirations regular, non-labored and within target range for patient.. Vitals Time Taken: 10:39 AM, Height: 67 in, Weight: 340 lbs, BMI: 53.2, Temperature: 98.2 F, Pulse: 57 bpm, Respiratory Rate: 18 breaths/min, Blood Pressure: 164/78 mmHg. Cardiovascular 2+ dorsalis pedis/posterior tibialis pulses. Psychiatric pleasant and cooperative. General Notes: Left lower extremity: Open wound with granulation tissue at the opening with a depth of 1.8 cm with no increased warmth, erythema or purulent drainage. No tenderness on palpation. Uncontrolled lymphedema above the knee. Integumentary (Hair, Skin) Wound #1 status is Open. Original cause of wound was Trauma. The date acquired was: 10/18/2021. The wound has been in treatment 40 weeks. The wound is located on the Left,Lateral Lower Leg. The wound measures 0.3cm length x 0.3cm width x 0.3cm depth; 0.071cm^2 area and 0.021cm^3 volume. There is Fat Layer (Subcutaneous Tissue) exposed. There is no undermining noted, however, there is tunneling at 12:00 with a maximum distance of 1.8cm. There is a medium amount of serosanguineous drainage noted. The wound margin is distinct with the outline attached to the wound base. There is large (67-100%) red granulation within  the wound bed. There is no necrotic tissue within the wound bed. The periwound skin appearance exhibited: Induration, Dry/Scaly, Hemosiderin Staining. The periwound skin appearance did not exhibit: Callus, Crepitus, Excoriation, Rash, Scarring, Maceration, Atrophie Blanche, Cyanosis, Ecchymosis, Mottled, Pallor, Rubor, Erythema. Periwound temperature was noted as No Abnormality. The periwound has tenderness on palpation. Assessment Active Problems ICD-10 Non-pressure chronic ulcer of other part of left lower leg with fat layer exposed Other early complications of trauma, initial encounter Chronic venous hypertension (idiopathic) with ulcer of left lower extremity Lymphedema, not elsewhere classified Paroxysmal atrial fibrillation Long term (current) use of anticoagulants Patient's wound appears well-healing. I recommended continuing the course with [facility] antibiotic ointment and collagen. Continue Ace wrap to help with compression. Follow-up in 2 weeks. Plan Follow-up Appointments: Return Appointment in 2 weeks. - Dr. Marletta Lor the appt. next week and make one for 2 weeks from today Other: - Continue Topical antibiotic ointment. use clobetasol antifungal at home around the wound. Anesthetic: Christine [name], Christine [name] ([id number]) [id number] Page 8 of 10 (In clinic) Topical Lidocaine 5% applied to wound bed Cellular or Tissue Based Products: Wound #1 Left,Lateral Lower Leg: Cellular or Tissue Based Product Type: - 05/12/2022 Puraply AM #1 applied. 05/17/22 PURAPLY AM # 2 05/24/2022 Puraply AM #3 05/31/2022 HOLD Edema Control - Lymphedema / SCD / Other: Avoid standing for long periods of time. Moisturize legs daily. - both legs every night before bed. WOUND #1: - Lower Leg Wound Laterality: Left, Lateral Cleanser: Wound Cleanser (Home Health) 1 x Per Day/30 Days Discharge Instructions: Cleanse the wound with wound cleanser prior to applying a clean dressing using  gauze sponges, not tissue or cotton balls. Peri-Wound Care: Ketoconazole Cream 2% 1 x Per Day/30 Days Discharge Instructions: Apply to  periwound in clinic. you use clobetasol at home. Topical: keystone 1 x Per Day/30 Days Discharge Instructions: Soak collagen in the keystone and pack into wound. Collage may be cut into thin strip. Prim Dressing: Promogran Prisma Matrix, 4.34 (sq in) (silver collagen) (Generic) 1 x Per Day/30 Days ary Discharge Instructions: Moisten collagen with saline or hydrogel Prim Dressing: Plain packing strip 1/4 (in) 1 x Per Day/30 Days ary Discharge Instructions: Lightly pack a small amount behind the collagen. leave a tail out of the wound to remove. Prim Dressing: Compounding topical antibiotics 1 x Per Day/30 Days ary Discharge Instructions: apply antibiotic moisted gauze lightly packed into wound bed. Secondary Dressing: ABD Pad, 5x9 1 x Per Day/30 Days Discharge Instructions: Apply over primary dressing as directed. Secondary Dressing: Woven Gauze Sponge, Non-Sterile 4x4 in (Home Health) 1 x Per Day/30 Days Discharge Instructions: Apply over primary dressing as directed. Secured With: [employer], 4.5x3.1 (in/yd) (Home Health) 1 x Per Day/30 Days Discharge Instructions: Secure with Kerlix as directed. Secured With: 74M Medipore H Soft Cloth Surgical T ape, 4 x 10 (in/yd) (Home Health) 1 x Per Day/30 Days Discharge Instructions: Secure with tape as directed. Com pression Wrap: ace wrap (Home Health) 1 x Per Day/30 Days Discharge Instructions: apply to leg and knee. 1. Collagen with Keystone antibiotic ointment 2. Ace wrap 3. Follow-up in 2 weeks Electronic Signature(s) Signed: 09/01/2022 12:04:27 PM By: Geralyn Corwin DO Entered By: Geralyn Corwin on 09/01/2022 11:08:59 -------------------------------------------------------------------------------- HxROS Details Patient Name: Date of Service: Christine [name], Christine [name] 09/01/2022 10:30 A M Medical  Record Number: [id number] Patient Account Number: [id number] Date of Birth/Sex: Treating RN: 05/30/1951 (72 y.o. F) Primary Care Provider: [name] Other Clinician: Referring Provider: Treating Provider/Extender: Annamary Rummage in Treatment: 40 Constitutional Symptoms (General Health) Medical History: Past Medical History Notes: Infarction of spleen Hematologic/Lymphatic Medical History: Past Medical History Notes: Hypothyroidism Cardiovascular Medical History: Positive for: Congestive Heart Failure; Hypertension Past Medical History Notes: A-Fib Gastrointestinal Medical History: Past Medical History Notes: Gastroesophageal reflux Christine [name], Christine [name] ([id number]) [id number] Page 9 of 10 Genitourinary Medical History: Past Medical History Notes: Chronic kidney disease Musculoskeletal Medical History: Past Medical History Notes: Osteoarthritis of knee Psychiatric Medical History: Past Medical History Notes: Anxiety Immunizations Pneumococcal Vaccine: Received Pneumococcal Vaccination: No Implantable Devices No devices added Hospitalization / Surgery History Type of Hospitalization/Surgery Cellulitis left leg- 01/03/2022-01/07/2022 Electronic Signature(s) Signed: 09/01/2022 12:04:27 PM By: Geralyn Corwin DO Entered By: Geralyn Corwin on 09/01/2022 11:05:52 -------------------------------------------------------------------------------- SuperBill Details Patient Name: Date of Service: Christine Rolling [name] 09/01/2022 Medical Record Number: [id number] Patient Account Number: [id number] Date of Birth/Sex: Treating RN: 1950/09/24 (72 y.o. Christine [name], Christine [name] Primary Care Provider: [name] Other Clinician: Referring Provider: Treating Provider/Extender: Annamary Rummage in Treatment: 40 Diagnosis Coding ICD-10 Codes Code Description [id number] Non-pressure chronic ulcer of other part of  left lower leg with fat layer exposed T79.8XXA Other early complications of trauma, initial encounter I87.312 Chronic venous hypertension (idiopathic) with ulcer of left lower extremity I89.0 Lymphedema, not elsewhere classified I48.0 Paroxysmal atrial fibrillation Z79.01 Long term (current) use of anticoagulants Facility Procedures : CPT4 Code: [id number] Description: 99213 - WOUND CARE VISIT-LEV 3 EST PT Modifier: Quantity: 1 Physician Procedures : CPT4 Code Description Modifier [id number] 99213 - WC PHYS LEVEL 3 - EST PT ICD-10 Diagnosis Description ANALIYA, PORCO ([id number]) [id number] L97.822 Non-pressure chronic ulcer of other part of left lower leg with fat layer  exposed T79.8XXA Other early complications of  trauma, initial encounter I87.312 Chronic venous hypertension (idiopathic) with ulcer of left lower extremity I89.0 Lymphedema, not elsewhere classified Quantity: 1 227.pdf Page 10 of 10 Electronic Signature(s) Signed: 09/01/2022 12:04:27 PM By: Geralyn Corwin DO Entered By: Geralyn Corwin on 09/01/2022 11:11:29

## 2022-09-08 ENCOUNTER — Ambulatory Visit (HOSPITAL_BASED_OUTPATIENT_CLINIC_OR_DEPARTMENT_OTHER): Payer: PPO | Admitting: Internal Medicine

## 2022-09-09 DIAGNOSIS — Z9049 Acquired absence of other specified parts of digestive tract: Secondary | ICD-10-CM | POA: Diagnosis not present

## 2022-09-09 DIAGNOSIS — E039 Hypothyroidism, unspecified: Secondary | ICD-10-CM | POA: Diagnosis not present

## 2022-09-09 DIAGNOSIS — Z6841 Body Mass Index (BMI) 40.0 and over, adult: Secondary | ICD-10-CM | POA: Diagnosis not present

## 2022-09-09 DIAGNOSIS — Z7901 Long term (current) use of anticoagulants: Secondary | ICD-10-CM | POA: Diagnosis not present

## 2022-09-09 DIAGNOSIS — F325 Major depressive disorder, single episode, in full remission: Secondary | ICD-10-CM | POA: Diagnosis not present

## 2022-09-09 DIAGNOSIS — I4891 Unspecified atrial fibrillation: Secondary | ICD-10-CM | POA: Diagnosis not present

## 2022-09-09 DIAGNOSIS — I110 Hypertensive heart disease with heart failure: Secondary | ICD-10-CM | POA: Diagnosis not present

## 2022-09-09 DIAGNOSIS — T24332D Burn of third degree of left lower leg, subsequent encounter: Secondary | ICD-10-CM | POA: Diagnosis not present

## 2022-09-09 DIAGNOSIS — I5032 Chronic diastolic (congestive) heart failure: Secondary | ICD-10-CM | POA: Diagnosis not present

## 2022-09-09 DIAGNOSIS — D500 Iron deficiency anemia secondary to blood loss (chronic): Secondary | ICD-10-CM | POA: Diagnosis not present

## 2022-09-09 DIAGNOSIS — Z96653 Presence of artificial knee joint, bilateral: Secondary | ICD-10-CM | POA: Diagnosis not present

## 2022-09-09 DIAGNOSIS — J309 Allergic rhinitis, unspecified: Secondary | ICD-10-CM | POA: Diagnosis not present

## 2022-09-09 DIAGNOSIS — Z9181 History of falling: Secondary | ICD-10-CM | POA: Diagnosis not present

## 2022-09-15 ENCOUNTER — Encounter (HOSPITAL_BASED_OUTPATIENT_CLINIC_OR_DEPARTMENT_OTHER): Payer: PPO | Admitting: Internal Medicine

## 2022-09-15 DIAGNOSIS — L97822 Non-pressure chronic ulcer of other part of left lower leg with fat layer exposed: Secondary | ICD-10-CM | POA: Diagnosis not present

## 2022-09-15 DIAGNOSIS — I87312 Chronic venous hypertension (idiopathic) with ulcer of left lower extremity: Secondary | ICD-10-CM | POA: Diagnosis not present

## 2022-09-15 DIAGNOSIS — I890 Lymphedema, not elsewhere classified: Secondary | ICD-10-CM | POA: Diagnosis not present

## 2022-09-15 DIAGNOSIS — T798XXA Other early complications of trauma, initial encounter: Secondary | ICD-10-CM

## 2022-09-15 NOTE — Progress Notes (Signed)
AURIEL, KIST ([id number]) [id number] Page 1 of 10 Visit Report for 09/15/2022 Chief Complaint Document Details Patient Name: Date of Service: [name] 09/15/2022 12:30 PM Medical Record Number: [id number] Patient Account Number: [id number] Date of Birth/Sex: Treating RN: 06/17/1951 (72 y.o. F) Primary Care Provider: [name] Other Clinician: Referring Provider: Treating Provider/Extender: Annamary Rummage in Treatment: 33 Information Obtained from: Patient Chief Complaint 11/19/2021; Left lower extremity wound status post fall Electronic Signature(s) Signed: 09/15/2022 2:31:01 PM By: Geralyn Corwin DO Entered By: Geralyn Corwin on 09/15/2022 13:06:24 -------------------------------------------------------------------------------- HPI Details Patient Name: Date of Service: Christine Rolling [name] 09/15/2022 12:30 PM Medical Record Number: [id number] Patient Account Number: [id number] Date of Birth/Sex: Treating RN: 01-11-1951 (72 y.o. F) Primary Care Provider: [name] Other Clinician: Referring Provider: Treating Provider/Extender: Annamary Rummage in Treatment: 58 History of Present Illness HPI Description: Admission 11/19/2021 [name] is a 72 year old female with a past medical history of paroxysmal A-fib on Eliquis, hypothyroidism, major depressive disorder, venous insufficiency and chronic diastolic heart failure that presents to the clinic for a 1 month history of nonhealing wound to the left lower extremity. She visited the ED on 10/18/2021 after a mechanical fall. She developed a hematoma that subsequently opened. She was hospitalized for 7 days and discharged on 10/25/2021. She has been on several different antibiotics For the past month. She states that most recently she was on Levaquin and linezolid for the past week. She states she completes her antibiotic course  tomorrow. She has been using Dakin's wet-to-dry dressings to the wound bed. She denies signs of infection. 4/7; patient presents for follow-up. She has been using Dakin's wet-to-dry dressings. She did end up going to the ED on 4/1 because she had excess bleeding with dressing change that she could not stop. She is on Eliquis for A-fib. In the ED they tied off a small artery. She has had no issues since discharge. She denies signs of infection. 4/14; this is a very difficult clinical situation. A patient with underlying chronic venous insufficiency and lymphedema very significant lower extremity edema had a hematoma after a fall on her left upper lateral lower leg. She is on Eliquis for atrial fibrillation apparently with a history of a splenic infarct following with Dr. Berton [name] of cardiology. She has exhibited significant bleeding from the wound surface including ao Venous bleeder that required suturing short while ago. She has been using Dakin's wet-to-dry packing and over the surface of the wound. She saw Dr. 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 Wants to communicate with Dr. Mikey [name] when she returns. In a perfect world where she was not on Eliquis she requires a wound VAC with additional compression wraps but I understand the reluctance to do this because of the concerns of bleeding 4/28; the patient's wound actually looks better today using Dakin's wet-to-dry that she is changing twice a day she is wrapping this with Kerlix and Ace wrapping. She tells me she had 2 small bleeding areas which were part of the superficial wound that stopped this week with direct pressure. Other than that no major issues. In follow-up from discussion of last week Dr. 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 The bigger  question from my point of view is the wound VAC issue. As far as she knows and her daughter-in-law verifies that she has not had any bleeding from the deeper parts of the wound although the bleeding has been superficial including the one that sent her to the ER for stitches. She is concerned that a wound VAC would cause further bleeding and I cannot completely allay those concerns. [name], [name] ([id number]) [id number] Page 2 of 10 5/8; patient presents for follow-up. She has no issues or complaints today. She has been using Dakin's wet-to-dry dressings without issues. She denies signs of infection. 5/12; patient presents for follow-up. She continues to use Dakin's wet-to-dry dressings without issues. She denies signs of infection. She reports some issues with bleeding at times but this has improved. She denies signs of infection. 6/1; patient presents for follow-up. She was recently hospitalized for upper left leg thigh cellulitis. She was given IV cefepime and vancomycin and discharged on oral antibiotics. She has been using Dakin's wet-to-dry dressings to the left lower leg wound. She reports improvement in healing. She currently denies systemic signs of infection. 6/7; patient is using Dakin's wet-to-dry twice daily. In general this looks better than when I saw this a month or so ago however still considerable depth to the tunnel in her left leg. She is going for iron infusions ordered by her primary care doctor I believe 6/15; patient presents for follow-up. She has been using Dakin's wet-to-dry dressings. She has noted some scattered small areas that blister up and heal on her left lower leg. She has been using mupirocin ointment on them. Nothing open today. 6/23; patient's been using Dakin's wet-to-dry dressings to the tunneled wound and Hydrofera Blue to the opening. She has no issues or complaints  today. 6/29; patient presents for follow-up. She has been using Dakin's wet-to-dry dressings to the tunneled wound and Hydrofera Blue to the opening. She states that [facility] is sticking to the wound bed. She denies signs of infection. 7/7; patient presents for follow-up. She has been using Dakin's wet-to-dry dressings to the tunneled wound and PolyMem silver to the opening without issues. 7/13; patient presents for follow-up. She has been using PolyMem silver to the opening and the rope to the tunnel. She has no issues or complaints today. She denies signs of infection. 7/20; patient presents for follow-up. We have been using PolyMem silver to the wound bed. She has no issues or complaints today. She is receiving her custom compression garments tomorrow. 7/27; patient presents for follow-up. She has been using PolyMem silver to the wound bed. She is having her custom compression garments adjusted as these are not staying on very easily. 8/30; patient presents for follow-up. We have been using PolyMem silver to the wound bed. She is still waiting on her custom compression garments to be ordered. She denies signs of infection. 8/11; patient presents for follow-up. The wound VAC has been started and patient has been using this for the past week. Patient has home health who changes the wound VAC. She developed irritation to the periwound. DuoDERM was not being used to the periwound despite being ordered. 8/15; patient presents for follow-up. She restarted the wound VAC and has had improvement to the periwound with the use of DuoDERM. 8/24; patient presents for follow-up. She has been using the wound VAC. She has developed a wound just superior to the original wound. Likely as a result from the wound VAC. She denies signs of infection. 8/29; patient presents  for follow-up. We have been using collagen to the wound bed. She has held off on the wound VAC for the past week. 9/7; patient presents for  follow-up. We have been using iodoform packing to the wound bed. She has mild tenderness to the periwound. She denies systemic signs of infection. 9/12; patient presents for follow-up. Patient has been using Dakin's wet-to-dry dressings. She has no issues or complaints today. She denies signs of infection. 9/21; patient presents for follow-up. PuraPly #1 was placed in standard fashion at last clinic visit. She has no issues or complaints today. She denies signs of infection. 10/3; patient presents for follow-up. PuraPly #2 was placed in standard fashion at last clinic visit. She has no issues or complaints today. She denies signs of infection. 10/10; patient presents for follow-up. PuraPly #3 was placed in standard fashion at last clinic visit. She has no issues or complaints today. She denies signs of infection. 10/17; patient presents for follow-up. She has been using silver alginate to the tunneled wound. She has no issues or complaints today. 10/31; patient presents for follow-up. We have been doing Dakin's wet-to-dry dressings to the tunneled wound. The wound VAC has been ordered however patient has not received this. She currently denies signs of infection. She reports a lot of serosanguineous drainage. 11/14; patient presents for follow-up. She had a PCR culture done at last clinic visit that grew Pseudomonas aeruginosa. She obtained Keystone antibiotic spray yesterday. She also obtained her ultrasound of the left lower extremity to assess for abscess however the vascular lab ordered On erroneous test. This has been rectified. Patient was not charged for the test. DVT study showed no DVT T also reported to our nurse that they did not notice any abscess s. Christen Butter while doing the DVT rule out. 11/21; patient presents for follow-up. She has been using Keystone antibiotic with gauze packing. She has no issues or complaints today. 11/30; patient presents for follow-up. She has been using Keystone  antibiotic with gauze packing. Depth is slightly larger today. She denies signs of infection. 12/5; patient presents for follow-up. She has been using Keystone antibiotic spray with collagen. She has no issues or complaints today. She has been using an Ace wrap for compression therapy. 12/12; patient presents for follow-up. She has been using Keystone antibiotic spray with collagen and Ace wrap for compression. She states that the Ace wrap falls down during the day. She is only able to use this at night. 12/19; patient presents for follow-up. She has been using Keystone antibiotic spray with collagen. She is unable to use an Ace wrap during the day because she states it falls down however she is able to use it at night without issues. She denies signs of infection. 12/26; patient presents for follow-up. She has been using Keystone antibiotic ointment with collagen. She uses an Ace wrap to help with compression at night. She has no issues or complaints today. 1/4; patient presents for follow-up. She is been using Keystone antibiotic with collagen. She uses the Ace wrap at night. She tries to use it in the daytime as well she has no issues or complaints today. 1/11; patient presents for follow-up. She has been using Keystone antibiotic ointment with collagen. She uses the Ace wrap at night T help with compression. o She has no issues or complaints today. 1/25; patient presents for follow-up. She has been using Keystone antibiotic ointment with collagen. Wound is stable. She uses ace wraps however during the JEANITA, CARNEIRO ([id number])  [phone].pdf Page 3 of 10 day these do not stay in place. She is able to use it at night. She has no issues or complaints today. Electronic Signature(s) Signed: 09/15/2022 2:31:01 PM By: Geralyn Corwin DO Entered By: Geralyn Corwin on 09/15/2022  13:07:02 -------------------------------------------------------------------------------- Physical Exam Details Patient Name: Date of Service: Christine Rolling [name] 09/15/2022 12:30 PM Medical Record Number: [id number] Patient Account Number: [id number] Date of Birth/Sex: Treating RN: Jan 16, 1951 (72 y.o. F) Primary Care Provider: [name] Other Clinician: Referring Provider: Treating Provider/Extender: Annamary Rummage in Treatment: 42 Constitutional respirations regular, non-labored and within target range for patient.. Cardiovascular 2+ dorsalis pedis/posterior tibialis pulses. Psychiatric pleasant and cooperative. Notes Left lower extremity: Open wound with granulation tissue at the opening with a depth of 2.2 cm with no increased warmth, erythema or purulent drainage. No tenderness on palpation. Uncontrolled lymphedema above the knee. Electronic Signature(s) Signed: 09/15/2022 2:31:01 PM By: Geralyn Corwin DO Entered By: Geralyn Corwin on 09/15/2022 13:25:16 -------------------------------------------------------------------------------- Physician Orders Details Patient Name: Date of Service: Christine Rolling [name] 09/15/2022 12:30 PM Medical Record Number: [id number] Patient Account Number: [id number] Date of Birth/Sex: Treating RN: [birth year] (72 y.o. Ardis Rowan, [name] Primary Care Provider: [name] Other Clinician: Referring Provider: Treating Provider/Extender: Annamary Rummage in Treatment: 57 Verbal / Phone Orders: No Diagnosis Coding Follow-up Appointments ppointment in 1 week. - w/ Dr. Mikey [name] Tuesday 09/20/22 @ 2:45 Rm # 7 [facility] overflow Return A ppointment in 2 weeks. - w/ Dr. Mikey [name] Tuesday 09/27/22 @ 10:15 Rm # 7 Return A Other: - Continue Topical antibiotic ointment. use clobetasol antifungal at home around the wound. Anesthetic (In clinic) Topical Lidocaine 5% applied to wound bed Negative Presssure  Wound Therapy SNAP Vac to wound continuously at 119mm/hg pressure - RUN IVR 09/15/22 Edema Control - Lymphedema / SCD / Other Avoid standing for long periods of time. Moisturize legs daily. - both legs every night before bed. JACI, DESANTO ([id number]) [id number] Page 4 of 10 Wound Treatment Wound #1 - Lower Leg Wound Laterality: Left, Lateral Cleanser: Wound Cleanser (Home Health) 1 x Per Day/30 Days Discharge Instructions: Cleanse the wound with wound cleanser prior to applying a clean dressing using gauze sponges, not tissue or cotton balls. Peri-Wound Care: Ketoconazole Cream 2% 1 x Per Day/30 Days Discharge Instructions: Apply to periwound in clinic. you use clobetasol at home. Topical: keystone 1 x Per Day/30 Days Discharge Instructions: Soak collagen in the keystone and pack into wound. Collage may be cut into thin strip. Prim Dressing: Endoform 2x2 in 1 x Per Day/30 Days ary Discharge Instructions: Moisten with saline Prim Dressing: Compounding topical antibiotics 1 x Per Day/30 Days ary Discharge Instructions: apply antibiotic moisted gauze lightly packed into wound bed. Secondary Dressing: ABD Pad, 5x9 1 x Per Day/30 Days Discharge Instructions: Apply over primary dressing as directed. Secondary Dressing: Woven Gauze Sponge, Non-Sterile 4x4 in (Home Health) 1 x Per Day/30 Days Discharge Instructions: Apply over primary dressing as directed. Secured With: Elastic Bandage 4 inch (ACE bandage) (DME) (Generic) 1 x Per Day/30 Days Discharge Instructions: Secure with ACE bandage as directed. Secured With: [employer], 4.5x3.1 (in/yd) 1 x Per Day/30 Days Discharge Instructions: Secure with Kerlix as directed. Secured With: 65M Medipore H Soft Cloth Surgical T ape, 4 x 10 (in/yd) 1 x Per Day/30 Days Discharge Instructions: Secure with tape as directed. Electronic Signature(s) Signed: 09/15/2022 2:31:01 PM By: Geralyn Corwin DO Entered By:  Geralyn Corwin on 09/15/2022 13:25:23 --------------------------------------------------------------------------------  Problem List Details Patient Name: Date of Service: [name] 09/15/2022 12:30 PM Medical Record Number: [id number] Patient Account Number: [id number] Date of Birth/Sex: Treating RN: [birth year] (72 y.o. 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/31/2023 No Yes T79.8XXA Other early complications of trauma, initial encounter 11/19/2021 No Yes I87.312 Chronic venous hypertension (idiopathic) with ulcer of left lower extremity 04/28/2022 No Yes I89.0 Lymphedema, not elsewhere classified 04/28/2022 No Yes KASHAE, CARSTENS ([id number]) [id number].pdf Page 5 of 10 I48.0 Paroxysmal atrial fibrillation 11/19/2021 No Yes Z79.01 Long term (current) use of anticoagulants 11/19/2021 No Yes Inactive Problems Resolved Problems Electronic Signature(s) Signed: 09/15/2022 2:31:01 PM By: Kalman Shan DO Entered By: Kalman Shan on 09/15/2022 13:06:09 -------------------------------------------------------------------------------- Progress Note Details Patient Name: Date of Service: [name] 09/15/2022 12:30 PM Medical Record Number: [id number] Patient Account Number: [id number] Date of Birth/Sex: Treating RN: 1950-10-16 (72 y.o. F) Primary Care Provider: Deland Pretty Other Clinician: Referring Provider: Treating Provider/Extender: Judie Grieve in Treatment: 56 Subjective Chief Complaint Information obtained from Patient 11/19/2021; Left lower extremity wound status post fall History of Present Illness (HPI) Admission  11/19/2021 Ms. Arliss Hepburn is a 72 year old female with a past medical history of paroxysmal A-fib on Eliquis, hypothyroidism, major depressive disorder, venous insufficiency and chronic diastolic heart failure that presents to the clinic for a 1 month history of nonhealing wound to the left lower extremity. She visited the ED on 10/18/2021 after a mechanical fall. She developed a hematoma that subsequently opened. She was hospitalized for 7 days and discharged on 10/25/2021. She has been on several different antibiotics For the past month. She states that most recently she was on Levaquin and linezolid for the past week. She states she completes her antibiotic course tomorrow. She has been using Dakin's wet-to-dry dressings to the wound bed. She denies signs of infection. 4/7; patient presents for follow-up. She has been using Dakin's wet-to-dry dressings. She did end up going to the ED on 4/1 because she had excess bleeding with dressing change that she could not stop. She is on Eliquis for A-fib. In the ED they tied off a small artery. She has had no issues since discharge. She denies signs of infection. 4/14; this is a very difficult clinical situation. A patient with underlying chronic venous insufficiency and lymphedema very significant lower extremity edema had a hematoma after a fall on her left upper lateral lower leg. She is on Eliquis for atrial fibrillation apparently with a history of a splenic infarct following with Dr. Jolyn Nap of cardiology. She has exhibited significant bleeding from the wound surface including ao Venous bleeder that required suturing short while ago. She has been using Dakin's wet-to-dry packing and over the surface of the wound. She saw Dr. Caryl Comes yesterday he is reluctant to consider stopping the Eliquis because of the prior history of presumed cardioembolism. Wants to communicate with Dr. Heber Aromas when she returns. In a perfect world where she was not on Eliquis  she requires a wound VAC with additional compression wraps but I understand the reluctance to do this because of the concerns of bleeding 4/28; the patient's wound actually looks better today using Dakin's wet-to-dry that she is changing twice a day she is wrapping this with Kerlix and Ace  wrapping. She tells me she had 2 small bleeding areas which were part of the superficial wound that stopped this week with direct pressure. Other than that no major issues. In follow-up from discussion of last week Dr. 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 The bigger question from my point of view is the wound VAC issue. As far as she knows and her daughter-in-law verifies that she has not had any bleeding from the deeper parts of the wound although the bleeding has been superficial including the one that sent her to the ER for stitches. She is concerned that a wound VAC would cause further bleeding and I cannot completely allay those concerns. 5/8; patient presents for follow-up. She has no issues or complaints today. She has been using Dakin's wet-to-dry dressings without issues. She denies signs of infection. 5/12; patient presents for follow-up. She continues to use Dakin's wet-to-dry dressings without issues. She denies signs of infection. She reports some issues with bleeding at times but this has improved. She denies signs of infection. 6/1; patient presents for follow-up. She was recently hospitalized for upper left leg thigh cellulitis. She was given IV cefepime and vancomycin and discharged on oral antibiotics. She has been using Dakin's wet-to-dry dressings to the left lower leg wound. She reports improvement in healing. She currently denies [name], [name] ([id number]) [id number] Page 6 of 10 systemic  signs of infection. 6/7; patient is using Dakin's wet-to-dry twice daily. In general this looks better than when I saw this a month or so ago however still considerable depth to the tunnel in her left leg. She is going for iron infusions ordered by her primary care doctor I believe 6/15; patient presents for follow-up. She has been using Dakin's wet-to-dry dressings. She has noted some scattered small areas that blister up and heal on her left lower leg. She has been using mupirocin ointment on them. Nothing open today. 6/23; patient's been using Dakin's wet-to-dry dressings to the tunneled wound and Hydrofera Blue to the opening. She has no issues or complaints today. 6/29; patient presents for follow-up. She has been using Dakin's wet-to-dry dressings to the tunneled wound and Hydrofera Blue to the opening. She states that [facility] is sticking to the wound bed. She denies signs of infection. 7/7; patient presents for follow-up. She has been using Dakin's wet-to-dry dressings to the tunneled wound and PolyMem silver to the opening without issues. 7/13; patient presents for follow-up. She has been using PolyMem silver to the opening and the rope to the tunnel. She has no issues or complaints today. She denies signs of infection. 7/20; patient presents for follow-up. We have been using PolyMem silver to the wound bed. She has no issues or complaints today. She is receiving her custom compression garments tomorrow. 7/27; patient presents for follow-up. She has been using PolyMem silver to the wound bed. She is having her custom compression garments adjusted as these are not staying on very easily. 8/30; patient presents for follow-up. We have been using PolyMem silver to the wound bed. She is still waiting on her custom compression garments to be ordered. She denies signs of infection. 8/11; patient presents for follow-up. The wound VAC has been started and patient has been using this for the  past week. Patient has home health who changes the wound VAC.  She developed irritation to the periwound. DuoDERM was not being used to the periwound despite being ordered. 8/15; patient presents for follow-up. She restarted the wound VAC and has had improvement to the periwound with the use of DuoDERM. 8/24; patient presents for follow-up. She has been using the wound VAC. She has developed a wound just superior to the original wound. Likely as a result from the wound VAC. She denies signs of infection. 8/29; patient presents for follow-up. We have been using collagen to the wound bed. She has held off on the wound VAC for the past week. 9/7; patient presents for follow-up. We have been using iodoform packing to the wound bed. She has mild tenderness to the periwound. She denies systemic signs of infection. 9/12; patient presents for follow-up. Patient has been using Dakin's wet-to-dry dressings. She has no issues or complaints today. She denies signs of infection. 9/21; patient presents for follow-up. PuraPly #1 was placed in standard fashion at last clinic visit. She has no issues or complaints today. She denies signs of infection. 10/3; patient presents for follow-up. PuraPly #2 was placed in standard fashion at last clinic visit. She has no issues or complaints today. She denies signs of infection. 10/10; patient presents for follow-up. PuraPly #3 was placed in standard fashion at last clinic visit. She has no issues or complaints today. She denies signs of infection. 10/17; patient presents for follow-up. She has been using silver alginate to the tunneled wound. She has no issues or complaints today. 10/31; patient presents for follow-up. We have been doing Dakin's wet-to-dry dressings to the tunneled wound. The wound VAC has been ordered however patient has not received this. She currently denies signs of infection. She reports a lot of serosanguineous drainage. 11/14; patient presents for  follow-up. She had a PCR culture done at last clinic visit that grew Pseudomonas aeruginosa. She obtained Keystone antibiotic spray yesterday. She also obtained her ultrasound of the left lower extremity to assess for abscess however the vascular lab ordered On erroneous test. This has been rectified. Patient was not charged for the test. DVT study showed no DVT T also reported to our nurse that they did not notice any abscess s. Christen Butter while doing the DVT rule out. 11/21; patient presents for follow-up. She has been using Keystone antibiotic with gauze packing. She has no issues or complaints today. 11/30; patient presents for follow-up. She has been using Keystone antibiotic with gauze packing. Depth is slightly larger today. She denies signs of infection. 12/5; patient presents for follow-up. She has been using Keystone antibiotic spray with collagen. She has no issues or complaints today. She has been using an Ace wrap for compression therapy. 12/12; patient presents for follow-up. She has been using Keystone antibiotic spray with collagen and Ace wrap for compression. She states that the Ace wrap falls down during the day. She is only able to use this at night. 12/19; patient presents for follow-up. She has been using Keystone antibiotic spray with collagen. She is unable to use an Ace wrap during the day because she states it falls down however she is able to use it at night without issues. She denies signs of infection. 12/26; patient presents for follow-up. She has been using Keystone antibiotic ointment with collagen. She uses an Ace wrap to help with compression at night. She has no issues or complaints today. 1/4; patient presents for follow-up. She is been using Keystone antibiotic with collagen. She uses the Ace wrap at night. She  tries to use it in the daytime as well she has no issues or complaints today. 1/11; patient presents for follow-up. She has been using Keystone antibiotic  ointment with collagen. She uses the Ace wrap at night T help with compression. o She has no issues or complaints today. 1/25; patient presents for follow-up. She has been using Keystone antibiotic ointment with collagen. Wound is stable. She uses ace wraps however during the day these do not stay in place. She is able to use it at night. She has no issues or complaints today. Patient History Medical History Cardiovascular Patient has history of Congestive Heart Failure, Hypertension KHYLEIGH, FURNEY ([id number]) [id number] Page 7 of 10 Hospitalization/Surgery History - Cellulitis left leg- 01/03/2022-01/07/2022. Medical A Surgical History Notes nd Constitutional Symptoms (General Health) Infarction of spleen Hematologic/Lymphatic Hypothyroidism Cardiovascular A-Fib Gastrointestinal Gastroesophageal reflux Genitourinary Chronic kidney disease Musculoskeletal Osteoarthritis of knee Psychiatric Anxiety Objective Constitutional respirations regular, non-labored and within target range for patient.. Vitals Time Taken: 12:46 PM, Height: 67 in, Weight: 340 lbs, BMI: 53.2, Temperature: 98.1 F, Pulse: 51 bpm, Respiratory Rate: 17 breaths/min, Blood Pressure: 154/80 mmHg. Cardiovascular 2+ dorsalis pedis/posterior tibialis pulses. Psychiatric pleasant and cooperative. General Notes: Left lower extremity: Open wound with granulation tissue at the opening with a depth of 2.2 cm with no increased warmth, erythema or purulent drainage. No tenderness on palpation. Uncontrolled lymphedema above the knee. Integumentary (Hair, Skin) Wound #1 status is Open. Original cause of wound was Trauma. The date acquired was: 10/18/2021. The wound has been in treatment 42 weeks. The wound is located on the Left,Lateral Lower Leg. The wound measures 0.4cm length x 0.4cm width x 1cm depth; 0.126cm^2 area and 0.126cm^3 volume. There is Fat Layer (Subcutaneous Tissue) exposed.  There is no tunneling or undermining noted. There is a medium amount of serosanguineous drainage noted. The wound margin is distinct with the outline attached to the wound base. There is large (67-100%) red granulation within the wound bed. There is no necrotic tissue within the wound bed. The periwound skin appearance exhibited: Induration, Dry/Scaly, Hemosiderin Staining. The periwound skin appearance did not exhibit: Callus, Crepitus, Excoriation, Rash, Scarring, Maceration, Atrophie Blanche, Cyanosis, Ecchymosis, Mottled, Pallor, Rubor, Erythema. Periwound temperature was noted as No Abnormality. The periwound has tenderness on palpation. Wound #1 status is Open. Original cause of wound was Trauma. The date acquired was: 10/18/2021. The wound has been in treatment 42 weeks. The wound is located on the Left,Lateral Lower Leg. The wound measures 0.4cm length x 0.4cm width x 1cm depth; 0.126cm^2 area and 0.126cm^3 volume. There is Fat Layer (Subcutaneous Tissue) exposed. There is no undermining noted, however, there is tunneling at 12:00 with a maximum distance of 2.2cm. There is a medium amount of serosanguineous drainage noted. The wound margin is distinct with the outline attached to the wound base. There is large (67-100%) red granulation within the wound bed. There is no necrotic tissue within the wound bed. The periwound skin appearance exhibited: Induration, Dry/Scaly, Hemosiderin Staining. The periwound skin appearance did not exhibit: Callus, Crepitus, Excoriation, Rash, Scarring, Maceration, Atrophie Blanche, Cyanosis, Ecchymosis, Mottled, Pallor, Rubor, Erythema. Periwound temperature was noted as No Abnormality. The periwound has tenderness on palpation. Assessment Active Problems ICD-10 Non-pressure chronic ulcer of other part of left lower leg with fat layer exposed Other early complications of trauma, initial encounter Chronic venous hypertension (idiopathic) with ulcer of left lower  extremity Lymphedema, not elsewhere classified Paroxysmal atrial fibrillation Long term (current) use of anticoagulants Patient's wound is stable.  She has been using collagen and Keystone antibiotic ointment to the wound bed. I recommended switching the dressing to endoform and continuing Keystone antibiotic ointment. She has significant lymphedema to her leg and above her knee. In office wraps do not stay in place. Unfortunately this is a barrier to her wound healing but is tough to control. For now I recommended continuing with the Ace wrap's for compression. We will also see if she would be covered for SNAP Vac she would likely benefit from this. Mariel SleetORRIS, Christine J ([id number]) [id number] Page 8 of 10 Plan Follow-up Appointments: Return Appointment in 1 week. - w/ Dr. Mikey BussingHoffman Tuesday 09/20/22 @ 2:45 Rm # 7 [facility] Return Appointment in 2 weeks. - w/ Dr. Mikey BussingHoffman Tuesday 09/27/22 @ 10:15 Rm # 7 Other: - Continue Topical antibiotic ointment. use clobetasol antifungal at home around the wound. Anesthetic: (In clinic) Topical Lidocaine 5% applied to wound bed Negative Presssure Wound Therapy: SNAP Vac to wound continuously at 18225mm/hg pressure - RUN IVR 09/15/22 Edema Control - Lymphedema / SCD / Other: Avoid standing for long periods of time. Moisturize legs daily. - both legs every night before bed. WOUND #1: - Lower Leg Wound Laterality: Left, Lateral Cleanser: Wound Cleanser (Home Health) 1 x Per Day/30 Days Discharge Instructions: Cleanse the wound with wound cleanser prior to applying a clean dressing using gauze sponges, not tissue or cotton balls. Peri-Wound Care: Ketoconazole Cream 2% 1 x Per Day/30 Days Discharge Instructions: Apply to periwound in clinic. you use clobetasol at home. Topical: keystone 1 x Per Day/30 Days Discharge Instructions: Soak collagen in the keystone and pack into wound. Collage may be cut into thin strip. Prim  Dressing: Endoform 2x2 in 1 x Per Day/30 Days ary Discharge Instructions: Moisten with saline Prim Dressing: Compounding topical antibiotics 1 x Per Day/30 Days ary Discharge Instructions: apply antibiotic moisted gauze lightly packed into wound bed. Secondary Dressing: ABD Pad, 5x9 1 x Per Day/30 Days Discharge Instructions: Apply over primary dressing as directed. Secondary Dressing: Woven Gauze Sponge, Non-Sterile 4x4 in (Home Health) 1 x Per Day/30 Days Discharge Instructions: Apply over primary dressing as directed. Secured With: Elastic Bandage 4 inch (ACE bandage) (DME) (Generic) 1 x Per Day/30 Days Discharge Instructions: Secure with ACE bandage as directed. Secured With: [employer] Roll Sterile, 4.5x3.1 (in/yd) 1 x Per Day/30 Days Discharge Instructions: Secure with Kerlix as directed. Secured With: 71M Medipore H Soft Cloth Surgical T ape, 4 x 10 (in/yd) 1 x Per Day/30 Days Discharge Instructions: Secure with tape as directed. 1. Endoform with Keystone antibiotic ointment 2. Ace wraps 3. Snap VACooIVR 4. Follow-up in 1 week Electronic Signature(s) Signed: 09/15/2022 2:31:01 PM By: Geralyn CorwinHoffman, Aubreanna Percle DO Entered By: Geralyn CorwinHoffman, Aleja Yearwood on 09/15/2022 13:28:14 -------------------------------------------------------------------------------- HxROS Details Patient Name: Date of Service: [name], Christine [name] 09/15/2022 12:30 PM Medical Record Number: [id number] Patient Account Number: [id number] Date of Birth/Sex: Treating RN: 03/30/1951 (72 y.o. F) Primary Care Provider: Merri BrunettePharr, Walter Other Clinician: Referring Provider: Treating Provider/Extender: Annamary RummageHoffman, Deborh Pense Pharr, Walter Weeks in Treatment: 5242 Constitutional Symptoms (General Health) Medical History: Past Medical History Notes: Infarction of spleen Hematologic/Lymphatic Medical History: Past Medical History Notes: Hypothyroidism Cardiovascular Medical History: Positive for: Congestive Heart Failure; Hypertension Past  Medical History NotesMarland Kitchen: Mariel SleetORRIS, Devoiry J ([id number]) [id number] Page 9 of 10 A-Fib Gastrointestinal Medical History: Past Medical History Notes: Gastroesophageal reflux Genitourinary Medical History: Past Medical History Notes: Chronic kidney disease Musculoskeletal Medical History: Past Medical History Notes: Osteoarthritis of knee Psychiatric Medical History: Past Medical History  Notes: Anxiety Immunizations Pneumococcal Vaccine: Received Pneumococcal Vaccination: No Implantable Devices No devices added Hospitalization / Surgery History Type of Hospitalization/Surgery Cellulitis left leg- 01/03/2022-01/07/2022 Electronic Signature(s) Signed: 09/15/2022 2:31:01 PM By: Geralyn Corwin DO Entered By: Geralyn Corwin on 09/15/2022 13:08:56 -------------------------------------------------------------------------------- SuperBill Details Patient Name: Date of Service: [name]. 09/15/2022 Medical Record Number: [id number] Patient Account Number: [id number] Date of Birth/Sex: Treating RN: 24-Dec-1950 (72 y.o. Ardis Rowan, [name] Primary Care Provider: [name] Other Clinician: Referring Provider: Treating Provider/Extender: Annamary Rummage in Treatment: 42 Diagnosis Coding ICD-10 Codes Code Description [phone] Non-pressure chronic ulcer of other part of left lower leg with fat layer exposed T79.8XXA Other early complications of trauma, initial encounter I87.312 Chronic venous hypertension (idiopathic) with ulcer of left lower extremity I89.0 Lymphedema, not elsewhere classified I48.0 Paroxysmal atrial fibrillation Z79.01 Long term (current) use of anticoagulants Facility Procedures : MARA, FAVERO Code: [id number] SUSSAN METER ([id number]) Description: 99213 - WOUND CARE VISIT-LEV 3 EST PT [id number] Modifier: [id number] Quantity: 1 227.pdf Page 10 of 10 Physician Procedures : CPT4 Code  Description Modifier [id number] - WC PHYS LEVEL 3 - EST PT ICD-10 Diagnosis Description L97.822 Non-pressure chronic ulcer of other part of left lower leg with fat layer exposed I87.312 Chronic venous hypertension (idiopathic) with ulcer  of left lower extremity I89.0 Lymphedema, not elsewhere classified T79.8XXA Other early complications of trauma, initial encounter Quantity: 1 Electronic Signature(s) Signed: 09/15/2022 2:31:01 PM By: Geralyn Corwin DO Entered By: Geralyn Corwin on 09/15/2022 13:32:53

## 2022-09-16 DIAGNOSIS — F325 Major depressive disorder, single episode, in full remission: Secondary | ICD-10-CM | POA: Diagnosis not present

## 2022-09-16 DIAGNOSIS — D500 Iron deficiency anemia secondary to blood loss (chronic): Secondary | ICD-10-CM | POA: Diagnosis not present

## 2022-09-16 DIAGNOSIS — I5032 Chronic diastolic (congestive) heart failure: Secondary | ICD-10-CM | POA: Diagnosis not present

## 2022-09-16 DIAGNOSIS — Z6841 Body Mass Index (BMI) 40.0 and over, adult: Secondary | ICD-10-CM | POA: Diagnosis not present

## 2022-09-16 DIAGNOSIS — Z9181 History of falling: Secondary | ICD-10-CM | POA: Diagnosis not present

## 2022-09-16 DIAGNOSIS — T24332D Burn of third degree of left lower leg, subsequent encounter: Secondary | ICD-10-CM | POA: Diagnosis not present

## 2022-09-16 DIAGNOSIS — E039 Hypothyroidism, unspecified: Secondary | ICD-10-CM | POA: Diagnosis not present

## 2022-09-16 DIAGNOSIS — J309 Allergic rhinitis, unspecified: Secondary | ICD-10-CM | POA: Diagnosis not present

## 2022-09-16 DIAGNOSIS — I4891 Unspecified atrial fibrillation: Secondary | ICD-10-CM | POA: Diagnosis not present

## 2022-09-16 DIAGNOSIS — I110 Hypertensive heart disease with heart failure: Secondary | ICD-10-CM | POA: Diagnosis not present

## 2022-09-16 DIAGNOSIS — Z96653 Presence of artificial knee joint, bilateral: Secondary | ICD-10-CM | POA: Diagnosis not present

## 2022-09-16 DIAGNOSIS — Z9049 Acquired absence of other specified parts of digestive tract: Secondary | ICD-10-CM | POA: Diagnosis not present

## 2022-09-20 ENCOUNTER — Encounter (HOSPITAL_BASED_OUTPATIENT_CLINIC_OR_DEPARTMENT_OTHER): Payer: PPO | Admitting: Internal Medicine

## 2022-09-20 DIAGNOSIS — L97822 Non-pressure chronic ulcer of other part of left lower leg with fat layer exposed: Secondary | ICD-10-CM

## 2022-09-20 DIAGNOSIS — I890 Lymphedema, not elsewhere classified: Secondary | ICD-10-CM

## 2022-09-20 DIAGNOSIS — T798XXA Other early complications of trauma, initial encounter: Secondary | ICD-10-CM | POA: Diagnosis not present

## 2022-09-20 DIAGNOSIS — I87312 Chronic venous hypertension (idiopathic) with ulcer of left lower extremity: Secondary | ICD-10-CM | POA: Diagnosis not present

## 2022-09-20 NOTE — Progress Notes (Signed)
[name] ([id number]) [id number] Page 1 of 9 Visit Report for 09/20/2022 Arrival Information Details Patient Name: Date of Service: [name] 09/20/2022 2:45 PM Medical Record Number: [id number] Patient Account Number: [id number] Date of Birth/Sex: Treating RN: 1951-03-28 (72 y.o. F) Primary Care Josepha Barbier: Deland Pretty Other Clinician: Referring Trust Crago: Treating Jakyren Fluegge/Extender: Judie Grieve in Treatment: 46 Visit Information History Since Last Visit Added or deleted any medications: No Patient Arrived: Walker Any new allergies or adverse reactions: No Arrival Time: 14:45 Had a fall or experienced change in No Accompanied By: self activities of daily living that may affect Transfer Assistance: None risk of falls: Patient Identification Verified: Yes Signs or symptoms of abuse/neglect since last visito No Secondary Verification Process Completed: Yes Hospitalized since last visit: No Patient Requires Transmission-Based Precautions: No Implantable device outside of the clinic excluding No Patient Has Alerts: Yes cellular tissue based products placed in the center Patient Alerts: Patient on Blood Thinner since last visit: Has Dressing in Place as Prescribed: Yes Pain Present Now: No Electronic Signature(s) Signed: 09/20/2022 3:52:19 PM By: Erenest Blank Entered By: Erenest Blank on 09/20/2022 14:46:59 -------------------------------------------------------------------------------- Clinic Level of Care Assessment Details Patient Name: Date of Service: [name] 09/20/2022 2:45 PM Medical Record Number: [id number] Patient Account Number: [id number] Date of Birth/Sex: Treating RN: [birth year] (72 y.o. [name] Primary Care Joseandres Mazer: Deland Pretty Other Clinician: Referring Jarion Hawthorne: Treating Phat Dalton/Extender: Judie Grieve in Treatment: 65 Clinic Level of Care  Assessment Items TOOL 4 Quantity Score X- 1 0 Use when only an EandM is performed on FOLLOW-UP visit ASSESSMENTS - Nursing Assessment / Reassessment X- 1 10 Reassessment of Co-morbidities (includes updates in patient status) X- 1 5 Reassessment of Adherence to Treatment Plan ASSESSMENTS - Wound and Skin A ssessment / Reassessment X - Simple Wound Assessment / Reassessment - one wound 1 5 []  - 0 Complex Wound Assessment / Reassessment - multiple wounds []  - 0 Dermatologic / Skin Assessment (not related to wound area) ASSESSMENTS - Focused Assessment X- 1 5 Circumferential Edema Measurements - multi extremities []  - 0 Nutritional Assessment / Counseling / Intervention [name] ([id number]) [id number] Page 2 of 9 []  - 0 Lower Extremity Assessment (monofilament, tuning fork, pulses) []  - 0 Peripheral Arterial Disease Assessment (using hand held doppler) ASSESSMENTS - Ostomy and/or Continence Assessment and Care []  - 0 Incontinence Assessment and Management []  - 0 Ostomy Care Assessment and Management (repouching, etc.) PROCESS - Coordination of Care X - Simple Patient / Family Education for ongoing care 1 15 []  - 0 Complex (extensive) Patient / Family Education for ongoing care X- 1 10 Staff obtains Programmer, systems, Records, T Results / Process Orders est []  - 0 Staff telephones HHA, Nursing Homes / Clarify orders / etc []  - 0 Routine Transfer to another Facility (non-emergent condition) []  - 0 Routine Hospital Admission (non-emergent condition) []  - 0 New Admissions / Biomedical engineer / Ordering NPWT Apligraf, etc. , []  - 0 Emergency Hospital Admission (emergent condition) []  - 0 Simple Discharge Coordination []  - 0 Complex (extensive) Discharge Coordination PROCESS - Special Needs []  - 0 Pediatric / Minor Patient Management []  - 0 Isolation Patient Management []  - 0 Hearing / Language / Visual special needs []  -  0 Assessment of Community assistance (transportation, D/C planning, etc.) []  - 0 Additional assistance / Altered mentation []  - 0 Support Surface(s) Assessment (bed, cushion, seat, etc.) INTERVENTIONS - Wound Cleansing / Measurement X -  Simple Wound Cleansing - one wound 1 5 []  - 0 Complex Wound Cleansing - multiple wounds X- 1 5 Wound Imaging (photographs - any number of wounds) []  - 0 Wound Tracing (instead of photographs) X- 1 5 Simple Wound Measurement - one wound []  - 0 Complex Wound Measurement - multiple wounds INTERVENTIONS - Wound Dressings X - Small Wound Dressing one or multiple wounds 1 10 []  - 0 Medium Wound Dressing one or multiple wounds []  - 0 Large Wound Dressing one or multiple wounds []  - 0 Application of Medications - topical []  - 0 Application of Medications - injection INTERVENTIONS - Miscellaneous []  - 0 External ear exam []  - 0 Specimen Collection (cultures, biopsies, blood, body fluids, etc.) []  - 0 Specimen(s) / Culture(s) sent or taken to Lab for analysis []  - 0 Patient Transfer (multiple staff / Civil Service fast streamer / Similar devices) []  - 0 Simple Staple / Suture removal (25 or less) []  - 0 Complex Staple / Suture removal (26 or more) []  - 0 Hypo / Hyperglycemic Management (close monitor of Blood Glucose) [name] ([id number]) [id number].pdf Page 3 of 9 []  - 0 Ankle / Brachial Index (ABI) - do not check if billed separately X- 1 5 Vital Signs Has the patient been seen at the hospital within the last three years: Yes Total Score: 80 Level Of Care: New/Established - Level 3 Electronic Signature(s) Signed: 09/20/2022 4:00:23 PM By: Sharyn Creamer RN, BSN Entered By: Sharyn Creamer on 09/20/2022 15:36:25 -------------------------------------------------------------------------------- Encounter Discharge Information Details Patient Name: Date of Service: [name]. 09/20/2022 2:45 PM Medical Record  Number: [id number] Patient Account Number: [id number] Date of Birth/Sex: Treating RN: 1951/05/11 (72 y.o. [name] Primary Care Johncarlos Holtsclaw: Deland Pretty Other Clinician: Referring Kahle Mcqueen: Treating Delores Thelen/Extender: Judie Grieve in Treatment: 64 Encounter Discharge Information Items Discharge Condition: Stable Ambulatory Status: Walker Discharge Destination: Home Transportation: Private Auto Accompanied By: self Schedule Follow-up Appointment: Yes Clinical Summary of Care: Patient Declined Electronic Signature(s) Signed: 09/20/2022 4:00:23 PM By: Sharyn Creamer RN, BSN Entered By: Sharyn Creamer on 09/20/2022 15:37:19 -------------------------------------------------------------------------------- Lower Extremity Assessment Details Patient Name: Date of Service: [name] 09/20/2022 2:45 PM Medical Record Number: [id number] Patient Account Number: [id number] Date of Birth/Sex: Treating RN: 1951-03-25 (72 y.o. F) Primary Care Rosser Collington: Deland Pretty Other Clinician: Referring Amanpreet Delmont: Treating Renee Erb/Extender: Judie Grieve in Treatment: 43 Edema Assessment Assessed: [Left: No] [Right: No] Edema: [Left: Ye] [Right: s] Calf Left: Right: Point of Measurement: 31 cm From Medial Instep 49 cm Ankle Left: Right: Point of Measurement: 9 cm From Medial Instep 26 cm Electronic Signature(s) Signed: 09/20/2022 3:52:19 PM By: Warren Danes, Mart Piggs ([id number]) Erenest Blank [phone].pdf Page 4 of 9 Signed: 09/20/2022 3:52:19 PM By: Jenny Reichmann By: Erenest Blank on 09/20/2022 14:55:40 -------------------------------------------------------------------------------- Multi Wound Chart Details Patient Name: Date of Service: [name] 09/20/2022 2:45 PM Medical Record Number: [id number] Patient Account Number: [id number] Date of Birth/Sex: Treating RN: [birth year] (72 y.o.  F) Primary Care Avree Szczygiel: Deland Pretty Other Clinician: Referring Lissa Rowles: Treating Jaia Alonge/Extender: Judie Grieve in Treatment: 12 Vital Signs Height(in): 67 Pulse(bpm): 49 Weight(lbs): 340 Blood Pressure(mmHg): 142/79 Body Mass Index(BMI): 53.2 Temperature(F): 98.6 Respiratory Rate(breaths/min): 18 [1:Photos:] [N/A:N/A] Left, Lateral Lower Leg N/A N/A Wound Location: Trauma N/A N/A Wounding Event: Lymphedema N/A N/A Primary Etiology: Congestive Heart Failure, N/A N/A Comorbid History: Hypertension 10/18/2021 N/A N/A Date Acquired: 83 N/A N/A Weeks of Treatment: Open N/A  N/A Wound Status: No N/A N/A Wound Recurrence: 1 N/A N/A Clustered Quantity: 0.3x0.3x1.4 N/A N/A Measurements L x W x D (cm) 0.071 N/A N/A A (cm) : rea 0.099 N/A N/A Volume (cm) : 100.00% N/A N/A % Reduction in Area: 100.00% N/A N/A % Reduction in Volume: Full Thickness With Exposed Support N/A N/A Classification: Structures Medium N/A N/A Exudate Amount: Serosanguineous N/A N/A Exudate Type: red, brown N/A N/A Exudate Color: Distinct, outline attached N/A N/A Wound Margin: Large (67-100%) N/A N/A Granulation Amount: Red N/A N/A Granulation Quality: None Present (0%) N/A N/A Necrotic Amount: Fat Layer (Subcutaneous Tissue): Yes N/A N/A Exposed Structures: Fascia: No Tendon: No Muscle: No Joint: No Bone: No Large (67-100%) N/A N/A Epithelialization: Induration: Yes N/A N/A Periwound Skin Texture: Excoriation: No Callus: No Crepitus: No Rash: No Scarring: No Dry/Scaly: Yes N/A N/A Periwound Skin Moisture: Maceration: No Hemosiderin Staining: Yes N/A N/A Periwound Skin Color: Atrophie Blanche: No Cyanosis: No Ecchymosis: No AVIVA, WOLFER ([id number]) [id number].pdf Page 5 of 9 Erythema: No Mottled: No Pallor: No Rubor: No No Abnormality N/A N/A Temperature: Yes N/A N/A Tenderness on  Palpation: Treatment Notes Electronic Signature(s) Signed: 09/20/2022 3:38:55 PM By: Geralyn Corwin DO Entered By: Geralyn Corwin on 09/20/2022 15:29:12 -------------------------------------------------------------------------------- Multi-Disciplinary Care Plan Details Patient Name: Date of Service: [name]. 09/20/2022 2:45 PM Medical Record Number: [id number] Patient Account Number: [id number] Date of Birth/Sex: Treating RN: 07-Feb-1951 (72 y.o. Orville Govern Primary Care Karliah Kowalchuk: [name] Other Clinician: Referring Audley Hinojos: Treating Micai Apolinar/Extender: Annamary Rummage in Treatment: 11 Active Inactive Abuse / Safety / Falls / Self Care Management Nursing Diagnoses: History of Falls Potential for falls Goals: Patient/caregiver will verbalize/demonstrate measure taken to improve self care Date Initiated: 11/19/2021 Date Inactivated: 04/05/2022 Target Resolution Date: 04/21/2022 Goal Status: Met Patient/caregiver will verbalize/demonstrate measures taken to prevent injury and/or falls Date Initiated: 11/19/2021 Target Resolution Date: 09/22/2022 Goal Status: Active Interventions: Provide education on basic hygiene Provide education on fall prevention Provide education on HBO safety Provide education on vaccinations Treatment Activities: Education provided on Basic Hygiene : 05/17/2022 Notes: Pain, Acute or Chronic Nursing Diagnoses: Pain, acute or chronic: actual or potential Potential alteration in comfort, pain Goals: Patient will verbalize adequate pain control and receive pain control interventions during procedures as needed Date Initiated: 11/19/2021 Date Inactivated: 04/05/2022 Target Resolution Date: 04/22/2022 Goal Status: Met Patient/caregiver will verbalize comfort level met Date Initiated: 11/19/2021 Target Resolution Date: 09/15/2022 Goal Status: Active Interventions: Encourage patient to take pain medications as  prescribed Provide education on pain management ARYAN, BELLO ([id number]) [phone].pdf Page 6 of 9 Reposition patient for comfort Treatment Activities: Administer pain control measures as ordered : 11/19/2021 Notes: Electronic Signature(s) Signed: 09/20/2022 4:00:23 PM By: Redmond Pulling RN, BSN Entered By: Redmond Pulling on 09/20/2022 14:59:25 -------------------------------------------------------------------------------- Pain Assessment Details Patient Name: Date of Service: [name]. 09/20/2022 2:45 PM Medical Record Number: [id number] Patient Account Number: [id number] Date of Birth/Sex: Treating RN: 10-19-1950 (72 y.o. F) Primary Care Emilio Baylock: [name] Other Clinician: Referring Kameryn Davern: Treating Davontay Watlington/Extender: Annamary Rummage in Treatment: 76 Active Problems Location of Pain Severity and Description of Pain Patient Has Paino No Site Locations Pain Management and Medication Current Pain Management: Electronic Signature(s) Signed: 09/20/2022 3:52:19 PM By: Thayer Dallas Entered By: Thayer Dallas on 09/20/2022 14:50:35 -------------------------------------------------------------------------------- Patient/Caregiver Education Details Patient Name: Date of Service: [name] 1/30/2024andnbsp2:45 PM Medical Record Number: [id number] Patient Account Number: [id number] Date of Birth/Gender:  Treating RN: [birth year] (72 y.o. [name] Primary Care Physician: Deland Pretty Other Clinician: Referring Physician: Treating Physician/Extender: Maximina, Pirozzi, Mart Piggs ([id number]) [id number] Page 7 of 9 Weeks in Treatment: 80 Education Assessment Education Provided To: Patient Education Topics Provided Wound/Skin Impairment: Methods: Explain/Verbal Responses: State content correctly [employer]) Signed: 09/20/2022 4:00:23 PM  By: Sharyn Creamer RN, BSN Entered By: Sharyn Creamer on 09/20/2022 14:59:59 -------------------------------------------------------------------------------- Wound Assessment Details Patient Name: Date of Service: [name] 09/20/2022 2:45 PM Medical Record Number: [id number] Patient Account Number: [id number] Date of Birth/Sex: Treating RN: [birth year] (72 y.o. [name] Primary Care Kolbee Stallman: Deland Pretty Other Clinician: Referring Delbert Vu: Treating Mersadez Linden/Extender: Judie Grieve in Treatment: 45 Wound Status Wound Number: 1 Primary Etiology: Lymphedema Wound Location: Left, Lateral Lower Leg Wound Status: Open Wounding Event: Trauma Comorbid History: Congestive Heart Failure, Hypertension Date Acquired: 10/18/2021 Weeks Of Treatment: 43 Clustered Wound: No Photos Wound Measurements Length: (cm) Width: (cm) Depth: (cm) Clustered Quantity: Area: (cm) Volume: (cm) 0.3 % Reduction in Area: 100% 0.3 % Reduction in Volume: 100% 1.4 Epithelialization: Large (67-100%) 1 Tunneling: No 0.071 Undermining: No 0.099 Wound Description Classification: Full Thickness With Exposed Support Structures Wound Margin: Distinct, outline attached Exudate Amount: Medium Exudate Type: Serosanguineous Exudate Color: red, brown Foul Odor After Cleansing: No Slough/Fibrino No Wound Bed LURETTA, EVERLY ([id number]) [id number] Page 8 of 9 Granulation Amount: Large (67-100%) Exposed Structure Granulation Quality: Red Fascia Exposed: No Necrotic Amount: None Present (0%) Fat Layer (Subcutaneous Tissue) Exposed: Yes Tendon Exposed: No Muscle Exposed: No Joint Exposed: No Bone Exposed: No Periwound Skin Texture Texture Color No Abnormalities Noted: No No Abnormalities Noted: No Callus: No Atrophie Blanche: No Crepitus: No Cyanosis: No Excoriation: No Ecchymosis: No Induration: Yes Erythema: No Rash:  No Hemosiderin Staining: Yes Scarring: No Mottled: No Pallor: No Moisture Rubor: No No Abnormalities Noted: No Dry / Scaly: Yes Temperature / Pain Maceration: No Temperature: No Abnormality Tenderness on Palpation: Yes Treatment Notes Wound #1 (Lower Leg) Wound Laterality: Left, Lateral Cleanser Wound Cleanser Discharge Instruction: Cleanse the wound with wound cleanser prior to applying a clean dressing using gauze sponges, not tissue or cotton balls. Peri-Wound Care Ketoconazole Cream 2% Discharge Instruction: Apply to periwound in clinic. you use clobetasol at home. Topical keystone Discharge Instruction: Soak collagen in the Keystone and pack into wound. Collage may be cut into thin strip. Primary Dressing Endoform 2x2 in Discharge Instruction: Moisten with saline Compounding topical antibiotics Discharge Instruction: apply antibiotic moisted gauze lightly packed into wound bed. Secondary Dressing ABD Pad, 5x9 Discharge Instruction: Apply over primary dressing as directed. Woven Gauze Sponge, Non-Sterile 4x4 in Discharge Instruction: Apply over primary dressing as directed. Secured With Elastic Bandage 4 inch (ACE bandage) Discharge Instruction: Secure with ACE bandage as directed. Kerlix Roll Sterile, 4.5x3.1 (in/yd) Discharge Instruction: Secure with Kerlix as directed. 10M Medipore H Soft Cloth Surgical T ape, 4 x 10 (in/yd) Discharge Instruction: Secure with tape as directed. Compression Wrap Compression Stockings Add-Ons Electronic Signature(s) Signed: 09/20/2022 4:00:23 PM By: Sharyn Creamer RN, BSN Entered By: Sharyn Creamer on 09/20/2022 [place], Maish Vaya J ([id number]) [id number] Page 9 of 9 -------------------------------------------------------------------------------- Vitals Details Patient Name: Date of Service: [name] 09/20/2022 2:45 PM Medical Record Number: [id number] Patient Account Number:  [id number] Date of Birth/Sex: Treating RN: Jan 17, 1951 (72 y.o. F) Primary Care Elwyn Klosinski: Deland Pretty Other Clinician: Referring Ebert Forrester: Treating Naithan Delage/Extender: Judie Grieve in Treatment: 67 Vital Signs  Time Taken: 14:47 Temperature (F): 98.6 Height (in): 67 Pulse (bpm): 49 Weight (lbs): 340 Respiratory Rate (breaths/min): 18 Body Mass Index (BMI): 53.2 Blood Pressure (mmHg): 142/79 Reference Range: 80 - 120 mg / dl Electronic Signature(s) Signed: 09/20/2022 3:52:19 PM By: Thayer Dallas Entered By: Thayer Dallas on 09/20/2022 14:50:22

## 2022-09-20 NOTE — Progress Notes (Signed)
Christine, [name] ([id number]) [id number] Page 1 of 10 Visit Report for 09/20/2022 Chief Complaint Document Details Patient Name: Date of Service: [name] 09/20/2022 2:45 PM Medical Record Number: [id number] Patient Account Number: [id number] Date of Birth/Sex: Treating RN: 09/08/1950 (72 y.o. F) Primary Care Provider: [name] Other Clinician: Referring Provider: Treating Provider/Extender: Annamary Rummage in Treatment: 01 Information Obtained from: Patient Chief Complaint 11/19/2021; Left lower extremity wound status post fall Electronic Signature(s) Signed: 09/20/2022 3:38:55 PM By: Geralyn Corwin DO Entered By: Geralyn Corwin on 09/20/2022 15:29:18 -------------------------------------------------------------------------------- HPI Details Patient Name: Date of Service: [name]. 09/20/2022 2:45 PM Medical Record Number: [id number] Patient Account Number: [id number] Date of Birth/Sex: Treating RN: 1950/09/17 (72 y.o. F) Primary Care Provider: [name] Other Clinician: Referring Provider: Treating Provider/Extender: Annamary Rummage in Treatment: 20 History of Present Illness HPI Description: Admission 11/19/2021 [name] is a 72 year old female with a past medical history of paroxysmal A-fib on Eliquis, hypothyroidism, major depressive disorder, venous insufficiency and chronic diastolic heart failure that presents to the clinic for a 1 month history of nonhealing wound to the left lower extremity. She visited the ED on 10/18/2021 after a mechanical fall. She developed a hematoma that subsequently opened. She was hospitalized for 7 days and discharged on 10/25/2021. She has been on several different antibiotics For the past month. She states that most recently she was on Levaquin and linezolid for the past week. She states she completes her antibiotic course tomorrow.  She has been using Dakin's wet-to-dry dressings to the wound bed. She denies signs of infection. 4/7; patient presents for follow-up. She has been using Dakin's wet-to-dry dressings. She did end up going to the ED on 4/1 because she had excess bleeding with dressing change that she could not stop. She is on Eliquis for A-fib. In the ED they tied off a small artery. She has had no issues since discharge. She denies signs of infection. 4/14; this is a very difficult clinical situation. A patient with underlying chronic venous insufficiency and lymphedema very significant lower extremity edema had a hematoma after a fall on her left upper lateral lower leg. She is on Eliquis for atrial fibrillation apparently with a history of a splenic infarct following with Dr. Berton Mount of cardiology. She has exhibited significant bleeding from the wound surface including ao Venous bleeder that required suturing short while ago. She has been using Dakin's wet-to-dry packing and over the surface of the wound. She saw Dr. Graciela Husbands yesterday he is reluctant to consider stopping the Eliquis because of the prior history of presumed cardioembolism. Wants to communicate with Dr. Mikey Bussing when she returns. In a perfect world where she was not on Eliquis she requires a wound VAC with additional compression wraps but I understand the reluctance to do this because of the concerns of bleeding 4/28; the patient's wound actually looks better today using Dakin's wet-to-dry that she is changing twice a day she is wrapping this with Kerlix and Ace wrapping. She tells me she had 2 small bleeding areas which were part of the superficial wound that stopped this week with direct pressure. Other than that no major issues. In follow-up from discussion of last week Dr. 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 The bigger  question from my point of view is the wound VAC issue. As far as she knows and her daughter-in-law verifies that she has not had any bleeding from the deeper parts of the wound although the bleeding has been superficial including the one that sent her to the ER for stitches. She is concerned that a wound VAC would cause further bleeding and I cannot completely allay those concerns. Christine, [name] ([id number]) [id number] Page 2 of 10 5/8; patient presents for follow-up. She has no issues or complaints today. She has been using Dakin's wet-to-dry dressings without issues. She denies signs of infection. 5/12; patient presents for follow-up. She continues to use Dakin's wet-to-dry dressings without issues. She denies signs of infection. She reports some issues with bleeding at times but this has improved. She denies signs of infection. 6/1; patient presents for follow-up. She was recently hospitalized for upper left leg thigh cellulitis. She was given IV cefepime and vancomycin and discharged on oral antibiotics. She has been using Dakin's wet-to-dry dressings to the left lower leg wound. She reports improvement in healing. She currently denies systemic signs of infection. 6/7; patient is using Dakin's wet-to-dry twice daily. In general this looks better than when I saw this a month or so ago however still considerable depth to the tunnel in her left leg. She is going for iron infusions ordered by her primary care doctor I believe 6/15; patient presents for follow-up. She has been using Dakin's wet-to-dry dressings. She has noted some scattered small areas that blister up and heal on her left lower leg. She has been using mupirocin ointment on them. Nothing open today. 6/23; patient's been using Dakin's wet-to-dry dressings to the tunneled wound and Hydrofera Blue to the opening. She has no issues or complaints today. 6/29;  patient presents for follow-up. She has been using Dakin's wet-to-dry dressings to the tunneled wound and Hydrofera Blue to the opening. She states that [facility] is sticking to the wound bed. She denies signs of infection. 7/7; patient presents for follow-up. She has been using Dakin's wet-to-dry dressings to the tunneled wound and PolyMem silver to the opening without issues. 7/13; patient presents for follow-up. She has been using PolyMem silver to the opening and the rope to the tunnel. She has no issues or complaints today. She denies signs of infection. 7/20; patient presents for follow-up. We have been using PolyMem silver to the wound bed. She has no issues or complaints today. She is receiving her custom compression garments tomorrow. 7/27; patient presents for follow-up. She has been using PolyMem silver to the wound bed. She is having her custom compression garments adjusted as these are not staying on very easily. 8/30; patient presents for follow-up. We have been using PolyMem silver to the wound bed. She is still waiting on her custom compression garments to be ordered. She denies signs of infection. 8/11; patient presents for follow-up. The wound VAC has been started and patient has been using this for the past week. Patient has home health who changes the wound VAC. She developed irritation to the periwound. DuoDERM was not being used to the periwound despite being ordered. 8/15; patient presents for follow-up. She restarted the wound VAC and has had improvement to the periwound with the use of DuoDERM. 8/24; patient presents for follow-up. She has been using the wound VAC. She has developed a wound just superior to the original wound. Likely as a result from the wound VAC. She denies signs of infection. 8/29; patient presents  for follow-up. We have been using collagen to the wound bed. She has held off on the wound VAC for the past week. 9/7; patient presents for follow-up. We  have been using iodoform packing to the wound bed. She has mild tenderness to the periwound. She denies systemic signs of infection. 9/12; patient presents for follow-up. Patient has been using Dakin's wet-to-dry dressings. She has no issues or complaints today. She denies signs of infection. 9/21; patient presents for follow-up. PuraPly #1 was placed in standard fashion at last clinic visit. She has no issues or complaints today. She denies signs of infection. 10/3; patient presents for follow-up. PuraPly #2 was placed in standard fashion at last clinic visit. She has no issues or complaints today. She denies signs of infection. 10/10; patient presents for follow-up. PuraPly #3 was placed in standard fashion at last clinic visit. She has no issues or complaints today. She denies signs of infection. 10/17; patient presents for follow-up. She has been using silver alginate to the tunneled wound. She has no issues or complaints today. 10/31; patient presents for follow-up. We have been doing Dakin's wet-to-dry dressings to the tunneled wound. The wound VAC has been ordered however patient has not received this. She currently denies signs of infection. She reports a lot of serosanguineous drainage. 11/14; patient presents for follow-up. She had a PCR culture done at last clinic visit that grew Pseudomonas aeruginosa. She obtained Keystone antibiotic spray yesterday. She also obtained her ultrasound of the left lower extremity to assess for abscess however the vascular lab ordered On erroneous test. This has been rectified. Patient was not charged for the test. DVT study showed no DVT T also reported to our nurse that they did not notice any abscess s. [name] while doing the DVT rule out. 11/21; patient presents for follow-up. She has been using Keystone antibiotic with gauze packing. She has no issues or complaints today. 11/30; patient presents for follow-up. She has been using Keystone antibiotic with  gauze packing. Depth is slightly larger today. She denies signs of infection. 12/5; patient presents for follow-up. She has been using Keystone antibiotic spray with collagen. She has no issues or complaints today. She has been using an Ace wrap for compression therapy. 12/12; patient presents for follow-up. She has been using Keystone antibiotic spray with collagen and Ace wrap for compression. She states that the Ace wrap falls down during the day. She is only able to use this at night. 12/19; patient presents for follow-up. She has been using Keystone antibiotic spray with collagen. She is unable to use an Ace wrap during the day because she states it falls down however she is able to use it at night without issues. She denies signs of infection. 12/26; patient presents for follow-up. She has been using Keystone antibiotic ointment with collagen. She uses an Ace wrap to help with compression at night. She has no issues or complaints today. 1/4; patient presents for follow-up. She is been using Keystone antibiotic with collagen. She uses the Ace wrap at night. She tries to use it in the daytime as well she has no issues or complaints today. 1/11; patient presents for follow-up. She has been using Keystone antibiotic ointment with collagen. She uses the Ace wrap at night T help with compression. o She has no issues or complaints today. 1/25; patient presents for follow-up. She has been using Keystone antibiotic ointment with collagen. Wound is stable. She uses ace wraps however during the TAIGEN, BRANYAN ([id number])  [phone].pdf Page 3 of 10 day these do not stay in place. She is able to use it at night. She has no issues or complaints today. 1/30; patient presents for follow-up. She has been using Keystone antibiotic ointment with endoform. She continues to use ace wraps. She has no issues or complaints today. There is been improvement in wound healing. Electronic  Signature(s) Signed: 09/20/2022 3:38:55 PM By: Kalman Shan DO Entered By: Kalman Shan on 09/20/2022 15:30:49 -------------------------------------------------------------------------------- Physical Exam Details Patient Name: Date of Service: [name] 09/20/2022 2:45 PM Medical Record Number: [id number] Patient Account Number: [id number] Date of Birth/Sex: Treating RN: Apr 09, 1951 (72 y.o. F) Primary Care Provider: Deland Pretty Other Clinician: Referring Provider: Treating Provider/Extender: Judie Grieve in Treatment: 33 Constitutional respirations regular, non-labored and within target range for patient.. Cardiovascular 2+ dorsalis pedis/posterior tibialis pulses. Psychiatric pleasant and cooperative. Notes Left lower extremity: Open wound with granulation tissue at the opening with a depth of 1.4 cm with no increased warmth, erythema or purulent drainage. No tenderness on palpation. Uncontrolled lymphedema above the knee. Electronic Signature(s) Signed: 09/20/2022 3:38:55 PM By: Kalman Shan DO Entered By: Kalman Shan on 09/20/2022 15:31:24 -------------------------------------------------------------------------------- Physician Orders Details Patient Name: Date of Service: [name]. 09/20/2022 2:45 PM Medical Record Number: [id number] Patient Account Number: [id number] Date of Birth/Sex: Treating RN: 04-13-1951 (72 y.o. Donalda Ewings Primary Care Provider: Deland Pretty Other Clinician: Referring Provider: Treating Provider/Extender: Judie Grieve in Treatment: 77 Verbal / Phone Orders: No Diagnosis Coding Follow-up Appointments ppointment in 1 week. - w/ Dr. Heber [place] Tuesday 09/27/22 @ 10:15 Rm # 7 Return A ppointment in 2 weeks. - w/ Dr. Heber [place] needs to schedule Return A Other: - Continue Topical antibiotic ointment. use clobetasol antifungal at home around the  wound. Anesthetic (In clinic) Topical Lidocaine 5% applied to wound bed Negative Presssure Wound Therapy SNAP Vac to wound continuously at 125mm/hg pressure - RUN IVR 09/15/22 VINCENTA, STEFFEY ([id number]) [phone].pdf Page 4 of 10 Edema Control - Lymphedema / SCD / Other Avoid standing for long periods of time. Moisturize legs daily. - both legs every night before bed. Wound Treatment Wound #1 - Lower Leg Wound Laterality: Left, Lateral Cleanser: Wound Cleanser (Home Health) 1 x Per Day/30 Days Discharge Instructions: Cleanse the wound with wound cleanser prior to applying a clean dressing using gauze sponges, not tissue or cotton balls. Peri-Wound Care: Ketoconazole Cream 2% 1 x Per Day/30 Days Discharge Instructions: Apply to periwound in clinic. you use clobetasol at home. Topical: keystone 1 x Per Day/30 Days Discharge Instructions: Soak collagen in the keystone and pack into wound. Collage may be cut into thin strip. Prim Dressing: Endoform 2x2 in 1 x Per Day/30 Days ary Discharge Instructions: Moisten with saline Prim Dressing: Compounding topical antibiotics 1 x Per Day/30 Days ary Discharge Instructions: apply antibiotic moisted gauze lightly packed into wound bed. Secondary Dressing: ABD Pad, 5x9 1 x Per Day/30 Days Discharge Instructions: Apply over primary dressing as directed. Secondary Dressing: Woven Gauze Sponge, Non-Sterile 4x4 in (Home Health) 1 x Per Day/30 Days Discharge Instructions: Apply over primary dressing as directed. Secured With: Elastic Bandage 4 inch (ACE bandage) (Generic) 1 x Per Day/30 Days Discharge Instructions: Secure with ACE bandage as directed. Secured With: [employer], 4.5x3.1 (in/yd) 1 x Per Day/30 Days Discharge Instructions: Secure with Kerlix as directed. Secured With: 34M Medipore H Soft Cloth Surgical T ape, 4 x 10 (in/yd) 1 x Per  Day/30 Days Discharge Instructions: Secure with tape as  directed. Electronic Signature(s) Signed: 09/20/2022 3:38:55 PM By: Geralyn Corwin DO Entered By: Geralyn Corwin on 09/20/2022 15:31:31 -------------------------------------------------------------------------------- Problem List Details Patient Name: Date of Service: [name]. 09/20/2022 2:45 PM Medical Record Number: [id number] Patient Account Number: [id number] Date of Birth/Sex: Treating RN: 09-12-1950 (72 y.o. 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/31/2023 No Yes T79.8XXA Other early complications of trauma, initial encounter 11/19/2021 No Yes I87.312 Chronic venous hypertension (idiopathic) with ulcer of left lower extremity 04/28/2022 No Yes CESIAH, WESTLEY ([id number]) [id number].pdf Page 5 of 10 I89.0 Lymphedema, not elsewhere classified 04/28/2022 No Yes I48.0 Paroxysmal atrial fibrillation 11/19/2021 No Yes Z79.01 Long term (current) use of anticoagulants 11/19/2021 No Yes Inactive Problems Resolved Problems Electronic Signature(s) Signed: 09/20/2022 3:38:55 PM By: Geralyn Corwin DO Entered By: Geralyn Corwin on 09/20/2022 15:29:07 -------------------------------------------------------------------------------- Progress Note Details Patient Name: Date of Service: [name]. 09/20/2022 2:45 PM Medical Record Number: [id number] Patient Account Number: [id number] Date of Birth/Sex: Treating RN: 10-03-1950 (72 y.o. F) Primary Care Provider: [name] Other Clinician: Referring Provider: Treating Provider/Extender: Annamary Rummage in Treatment: 70 Subjective Chief Complaint Information obtained from  Patient 11/19/2021; Left lower extremity wound status post fall History of Present Illness (HPI) Admission 11/19/2021 Ms. Adaleena Mooers is a 72 year old female with a past medical history of paroxysmal A-fib on Eliquis, hypothyroidism, major depressive disorder, venous insufficiency and chronic diastolic heart failure that presents to the clinic for a 1 month history of nonhealing wound to the left lower extremity. She visited the ED on 10/18/2021 after a mechanical fall. She developed a hematoma that subsequently opened. She was hospitalized for 7 days and discharged on 10/25/2021. She has been on several different antibiotics For the past month. She states that most recently she was on Levaquin and linezolid for the past week. She states she completes her antibiotic course tomorrow. She has been using Dakin's wet-to-dry dressings to the wound bed. She denies signs of infection. 4/7; patient presents for follow-up. She has been using Dakin's wet-to-dry dressings. She did end up going to the ED on 4/1 because she had excess bleeding with dressing change that she could not stop. She is on Eliquis for A-fib. In the ED they tied off a small artery. She has had no issues since discharge. She denies signs of infection. 4/14; this is a very difficult clinical situation. A patient with underlying chronic venous insufficiency and lymphedema very significant lower extremity edema had a hematoma after a fall on her left upper lateral lower leg. She is on Eliquis for atrial fibrillation apparently with a history of a splenic infarct following with Dr. Berton Mount of cardiology. She has exhibited significant bleeding from the wound surface including ao Venous bleeder that required suturing short while ago. She has been using Dakin's wet-to-dry packing and over the surface of the wound. She saw Dr. Graciela Husbands yesterday he is reluctant to consider stopping the Eliquis because of the prior history of presumed  cardioembolism. Wants to communicate with Dr. Mikey Bussing when she returns. In a perfect world where she was not on Eliquis she requires a wound VAC with additional compression wraps but I understand the reluctance to do this because of the concerns  of bleeding 4/28; the patient's wound actually looks better today using Dakin's wet-to-dry that she is changing twice a day she is wrapping this with Kerlix and Ace wrapping. She tells me she had 2 small bleeding areas which were part of the superficial wound that stopped this week with direct pressure. Other than that no major issues. In follow-up from discussion of last week Dr. 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 The bigger question from my point of view is the wound VAC issue. As far as she knows and her daughter-in-law verifies that she has not had any bleeding from the deeper parts of the wound although the bleeding has been superficial including the one that sent her to the ER for stitches. She is concerned that a wound VAC would cause further bleeding and I cannot completely allay those concerns. 5/8; patient presents for follow-up. She has no issues or complaints today. She has been using Dakin's wet-to-dry dressings without issues. She denies signs of infection. 5/12; patient presents for follow-up. She continues to use Dakin's wet-to-dry dressings without issues. She denies signs of infection. She reports some issues VENNIE, SALSBURY ([id number]) [id number] Page 6 of 10 with bleeding at times but this has improved. She denies signs of infection. 6/1; patient presents for follow-up. She was recently hospitalized for upper left leg thigh cellulitis. She was given IV cefepime and vancomycin and discharged on oral antibiotics. She has been using  Dakin's wet-to-dry dressings to the left lower leg wound. She reports improvement in healing. She currently denies systemic signs of infection. 6/7; patient is using Dakin's wet-to-dry twice daily. In general this looks better than when I saw this a month or so ago however still considerable depth to the tunnel in her left leg. She is going for iron infusions ordered by her primary care doctor I believe 6/15; patient presents for follow-up. She has been using Dakin's wet-to-dry dressings. She has noted some scattered small areas that blister up and heal on her left lower leg. She has been using mupirocin ointment on them. Nothing open today. 6/23; patient's been using Dakin's wet-to-dry dressings to the tunneled wound and Hydrofera Blue to the opening. She has no issues or complaints today. 6/29; patient presents for follow-up. She has been using Dakin's wet-to-dry dressings to the tunneled wound and Hydrofera Blue to the opening. She states that [facility] is sticking to the wound bed. She denies signs of infection. 7/7; patient presents for follow-up. She has been using Dakin's wet-to-dry dressings to the tunneled wound and PolyMem silver to the opening without issues. 7/13; patient presents for follow-up. She has been using PolyMem silver to the opening and the rope to the tunnel. She has no issues or complaints today. She denies signs of infection. 7/20; patient presents for follow-up. We have been using PolyMem silver to the wound bed. She has no issues or complaints today. She is receiving her custom compression garments tomorrow. 7/27; patient presents for follow-up. She has been using PolyMem silver to the wound bed. She is having her custom compression garments adjusted as these are not staying on very easily. 8/30; patient presents for follow-up. We have been using PolyMem silver to the wound bed. She is still waiting on her custom compression garments to be ordered. She denies signs  of infection. 8/11; patient  presents for follow-up. The wound VAC has been started and patient has been using this for the past week. Patient has home health who changes the wound VAC. She developed irritation to the periwound. DuoDERM was not being used to the periwound despite being ordered. 8/15; patient presents for follow-up. She restarted the wound VAC and has had improvement to the periwound with the use of DuoDERM. 8/24; patient presents for follow-up. She has been using the wound VAC. She has developed a wound just superior to the original wound. Likely as a result from the wound VAC. She denies signs of infection. 8/29; patient presents for follow-up. We have been using collagen to the wound bed. She has held off on the wound VAC for the past week. 9/7; patient presents for follow-up. We have been using iodoform packing to the wound bed. She has mild tenderness to the periwound. She denies systemic signs of infection. 9/12; patient presents for follow-up. Patient has been using Dakin's wet-to-dry dressings. She has no issues or complaints today. She denies signs of infection. 9/21; patient presents for follow-up. PuraPly #1 was placed in standard fashion at last clinic visit. She has no issues or complaints today. She denies signs of infection. 10/3; patient presents for follow-up. PuraPly #2 was placed in standard fashion at last clinic visit. She has no issues or complaints today. She denies signs of infection. 10/10; patient presents for follow-up. PuraPly #3 was placed in standard fashion at last clinic visit. She has no issues or complaints today. She denies signs of infection. 10/17; patient presents for follow-up. She has been using silver alginate to the tunneled wound. She has no issues or complaints today. 10/31; patient presents for follow-up. We have been doing Dakin's wet-to-dry dressings to the tunneled wound. The wound VAC has been ordered however patient has not received  this. She currently denies signs of infection. She reports a lot of serosanguineous drainage. 11/14; patient presents for follow-up. She had a PCR culture done at last clinic visit that grew Pseudomonas aeruginosa. She obtained Keystone antibiotic spray yesterday. She also obtained her ultrasound of the left lower extremity to assess for abscess however the vascular lab ordered On erroneous test. This has been rectified. Patient was not charged for the test. DVT study showed no DVT T also reported to our nurse that they did not notice any abscess s. [name] while doing the DVT rule out. 11/21; patient presents for follow-up. She has been using Keystone antibiotic with gauze packing. She has no issues or complaints today. 11/30; patient presents for follow-up. She has been using Keystone antibiotic with gauze packing. Depth is slightly larger today. She denies signs of infection. 12/5; patient presents for follow-up. She has been using Keystone antibiotic spray with collagen. She has no issues or complaints today. She has been using an Ace wrap for compression therapy. 12/12; patient presents for follow-up. She has been using Keystone antibiotic spray with collagen and Ace wrap for compression. She states that the Ace wrap falls down during the day. She is only able to use this at night. 12/19; patient presents for follow-up. She has been using Keystone antibiotic spray with collagen. She is unable to use an Ace wrap during the day because she states it falls down however she is able to use it at night without issues. She denies signs of infection. 12/26; patient presents for follow-up. She has been using Keystone antibiotic ointment with collagen. She uses an Ace wrap to help with compression at night.  She has no issues or complaints today. 1/4; patient presents for follow-up. She is been using Keystone antibiotic with collagen. She uses the Ace wrap at night. She tries to use it in the daytime as well  she has no issues or complaints today. 1/11; patient presents for follow-up. She has been using Keystone antibiotic ointment with collagen. She uses the Ace wrap at night T help with compression. o She has no issues or complaints today. 1/25; patient presents for follow-up. She has been using Keystone antibiotic ointment with collagen. Wound is stable. She uses ace wraps however during the day these do not stay in place. She is able to use it at night. She has no issues or complaints today. 1/30; patient presents for follow-up. She has been using Keystone antibiotic ointment with endoform. She continues to use ace wraps. She has no issues or complaints today. There is been improvement in wound healing. XOE, HOE ([id number]) [id number] Page 7 of 10 Patient History Medical History Cardiovascular Patient has history of Congestive Heart Failure, Hypertension Hospitalization/Surgery History - Cellulitis left leg- 01/03/2022-01/07/2022. Medical A Surgical History Notes nd Constitutional Symptoms (General Health) Infarction of spleen Hematologic/Lymphatic Hypothyroidism Cardiovascular A-Fib Gastrointestinal Gastroesophageal reflux Genitourinary Chronic kidney disease Musculoskeletal Osteoarthritis of knee Psychiatric Anxiety Objective Constitutional respirations regular, non-labored and within target range for patient.. Vitals Time Taken: 2:47 PM, Height: 67 in, Weight: 340 lbs, BMI: 53.2, Temperature: 98.6 F, Pulse: 49 bpm, Respiratory Rate: 18 breaths/min, Blood Pressure: 142/79 mmHg. Cardiovascular 2+ dorsalis pedis/posterior tibialis pulses. Psychiatric pleasant and cooperative. General Notes: Left lower extremity: Open wound with granulation tissue at the opening with a depth of 1.4 cm with no increased warmth, erythema or purulent drainage. No tenderness on palpation. Uncontrolled lymphedema above the knee. Integumentary (Hair,  Skin) Wound #1 status is Open. Original cause of wound was Trauma. The date acquired was: 10/18/2021. The wound has been in treatment 43 weeks. The wound is located on the Left,Lateral Lower Leg. The wound measures 0.3cm length x 0.3cm width x 1.4cm depth; 0.071cm^2 area and 0.099cm^3 volume. There is Fat Layer (Subcutaneous Tissue) exposed. There is no tunneling or undermining noted. There is a medium amount of serosanguineous drainage noted. The wound margin is distinct with the outline attached to the wound base. There is large (67-100%) red granulation within the wound bed. There is no necrotic tissue within the wound bed. The periwound skin appearance exhibited: Induration, Dry/Scaly, Hemosiderin Staining. The periwound skin appearance did not exhibit: Callus, Crepitus, Excoriation, Rash, Scarring, Maceration, Atrophie Blanche, Cyanosis, Ecchymosis, Mottled, Pallor, Rubor, Erythema. Periwound temperature was noted as No Abnormality. The periwound has tenderness on palpation. Assessment Active Problems ICD-10 Non-pressure chronic ulcer of other part of left lower leg with fat layer exposed Other early complications of trauma, initial encounter Chronic venous hypertension (idiopathic) with ulcer of left lower extremity Lymphedema, not elsewhere classified Paroxysmal atrial fibrillation Long term (current) use of anticoagulants Patient's wound has shown improvement in depth since last clinic visit. I recommended continuing the course with endoform and Keystone antibiotic ointment. We had a donated juxta fit and this was given to patient in office today. I recommended she use this daily to help with her edema control. Follow-up in 1 week. We are still awaiting insurance approval for snap VAC. Plan PAYDEN, BONUS ([id number]) [id number] Page 8 of 10 Follow-up Appointments: Return Appointment in 1 week. - w/ Dr. Heber [place] Tuesday 09/27/22 @ 10:15 Rm # 7 Return  Appointment in 2 weeks. - w/ Dr. Heber [place]  needs to schedule Other: - Continue Topical antibiotic ointment. use clobetasol antifungal at home around the wound. Anesthetic: (In clinic) Topical Lidocaine 5% applied to wound bed Negative Presssure Wound Therapy: SNAP Vac to wound continuously at 154mm/hg pressure - RUN IVR 09/15/22 Edema Control - Lymphedema / SCD / Other: Avoid standing for long periods of time. Moisturize legs daily. - both legs every night before bed. WOUND #1: - Lower Leg Wound Laterality: Left, Lateral Cleanser: Wound Cleanser (Home Health) 1 x Per Day/30 Days Discharge Instructions: Cleanse the wound with wound cleanser prior to applying a clean dressing using gauze sponges, not tissue or cotton balls. Peri-Wound Care: Ketoconazole Cream 2% 1 x Per Day/30 Days Discharge Instructions: Apply to periwound in clinic. you use clobetasol at home. Topical: keystone 1 x Per Day/30 Days Discharge Instructions: Soak collagen in the keystone and pack into wound. Collage may be cut into thin strip. Prim Dressing: Endoform 2x2 in 1 x Per Day/30 Days ary Discharge Instructions: Moisten with saline Prim Dressing: Compounding topical antibiotics 1 x Per Day/30 Days ary Discharge Instructions: apply antibiotic moisted gauze lightly packed into wound bed. Secondary Dressing: ABD Pad, 5x9 1 x Per Day/30 Days Discharge Instructions: Apply over primary dressing as directed. Secondary Dressing: Woven Gauze Sponge, Non-Sterile 4x4 in (Home Health) 1 x Per Day/30 Days Discharge Instructions: Apply over primary dressing as directed. Secured With: Elastic Bandage 4 inch (ACE bandage) (Generic) 1 x Per Day/30 Days Discharge Instructions: Secure with ACE bandage as directed. Secured With: [employer], 4.5x3.1 (in/yd) 1 x Per Day/30 Days Discharge Instructions: Secure with Kerlix as directed. Secured With: 47M Medipore H Soft Cloth Surgical T ape, 4 x 10 (in/yd) 1 x Per Day/30  Days Discharge Instructions: Secure with tape as directed. 1. Endoform with antibiotic ointment 2. Juxta fit compression daily 3. Follow-up in 1 week Electronic Signature(s) Signed: 09/20/2022 3:38:55 PM By: Geralyn Corwin DO Entered By: Geralyn Corwin on 09/20/2022 15:34:37 -------------------------------------------------------------------------------- HxROS Details Patient Name: Date of Service: Marthenia Rolling [name] 09/20/2022 2:45 PM Medical Record Number: [id number] Patient Account Number: [id number] Date of Birth/Sex: Treating RN: 21-Apr-1951 (72 y.o. F) Primary Care Provider: [name] Other Clinician: Referring Provider: Treating Provider/Extender: Annamary Rummage in Treatment: 42 Constitutional Symptoms (General Health) Medical History: Past Medical History Notes: Infarction of spleen Hematologic/Lymphatic Medical History: Past Medical History Notes: Hypothyroidism Cardiovascular Medical History: Positive for: Congestive Heart Failure; Hypertension Past Medical History Notes: A-Fib Gastrointestinal EMMANUELA, GHAZI ([id number]) [id number] Page 9 of 10 Medical History: Past Medical History Notes: Gastroesophageal reflux Genitourinary Medical History: Past Medical History Notes: Chronic kidney disease Musculoskeletal Medical History: Past Medical History Notes: Osteoarthritis of knee Psychiatric Medical History: Past Medical History Notes: Anxiety Immunizations Pneumococcal Vaccine: Received Pneumococcal Vaccination: No Implantable Devices No devices added Hospitalization / Surgery History Type of Hospitalization/Surgery Cellulitis left leg- 01/03/2022-01/07/2022 Electronic Signature(s) Signed: 09/20/2022 3:38:55 PM By: Geralyn Corwin DO Entered By: Geralyn Corwin on 09/20/2022 15:30:54 -------------------------------------------------------------------------------- SuperBill Details Patient  Name: Date of Service: [name]. 09/20/2022 Medical Record Number: [id number] Patient Account Number: [id number] Date of Birth/Sex: Treating RN: 27-Jan-1951 (72 y.o. F) Primary Care Provider: [name] Other Clinician: Referring Provider: Treating Provider/Extender: Annamary Rummage in Treatment: 43 Diagnosis Coding ICD-10 Codes Code Description [phone] Non-pressure chronic ulcer of other part of left lower leg with fat layer exposed T79.8XXA Other early complications of trauma, initial encounter I87.312 Chronic venous hypertension (idiopathic) with ulcer of left lower extremity I89.0  Lymphedema, not elsewhere classified I48.0 Paroxysmal atrial fibrillation Z79.01 Long term (current) use of anticoagulants Facility Procedures : CPT4 Code: [id number] Description: 99213 - WOUND CARE VISIT-LEV 3 EST PT Modifier: 25 Quantity: 1 Physician Procedures MARYCATHERINE, MANISCALCO ([id number]): CPT4 Code Description [id number] 99213 - WC PHYS LEVEL 3 - EST PT ICD-10 Diagnosis Description L97.822 Non-pressure chronic ulcer of other part of left lower leg with T79.8XXA Other early complications of trauma, initial  encounter I87.312 Chronic venous hypertension (idiopathic) with ulcer of left low I89.0 Lymphedema, not elsewhere classified [id number] Page 10 of 10: Quantity Modifier 1 fat layer exposed er extremity Electronic Signature(s) Signed: 09/20/2022 3:38:55 PM By: Geralyn Corwin DO Signed: 09/20/2022 4:00:23 PM By: Redmond Pulling RN, BSN Entered By: Redmond Pulling on 09/20/2022 15:36:37

## 2022-09-21 NOTE — Progress Notes (Signed)
NDIA, SAMPATH ([id number]) [id number].pdf Page 1 of 9 Visit Report for 09/15/2022 Arrival Information Details Patient Name: Date of Service: [name] 09/15/2022 12:30 PM Medical Record Number: [id number] Patient Account Number: [id number] Date of Birth/Sex: Treating RN: 1950/08/23 (72 y.o. [name], [name] Primary Care Jene Oravec: Deland Pretty Other Clinician: Referring Ples Trudel: Treating Kaelem Brach/Extender: Judie Grieve in Treatment: 82 Visit Information History Since Last Visit Added or deleted any medications: No Patient Arrived: Ambulatory Any new allergies or adverse reactions: No Arrival Time: 12:46 Had a fall or experienced change in No Accompanied By: self activities of daily living that may affect Transfer Assistance: Manual risk of falls: Patient Identification Verified: Yes Signs or symptoms of abuse/neglect since last visito No Secondary Verification Process Completed: Yes Hospitalized since last visit: No Patient Requires Transmission-Based Precautions: No Implantable device outside of the clinic excluding No Patient Has Alerts: Yes cellular tissue based products placed in the center Patient Alerts: Patient on Blood Thinner since last visit: Has Dressing in Place as Prescribed: Yes Pain Present Now: No Electronic Signature(s) Signed: 09/21/2022 8:39:24 AM By: Rhae Hammock RN Entered By: Rhae Hammock on 09/15/2022 12:46:56 -------------------------------------------------------------------------------- Clinic Level of Care Assessment Details Patient Name: Date of Service: [name] 09/15/2022 12:30 PM Medical Record Number: [id number] Patient Account Number: [id number] Date of Birth/Sex: Treating RN: [birth year] (72 y.o. [name], [name] Primary Care Icy Fuhrmann: Deland Pretty Other Clinician: Referring Quynh Basso: Treating Chong Wojdyla/Extender: Judie Grieve  in Treatment: 42 Clinic Level of Care Assessment Items TOOL 4 Quantity Score X- 1 0 Use when only an EandM is performed on FOLLOW-UP visit ASSESSMENTS - Nursing Assessment / Reassessment X- 1 10 Reassessment of Co-morbidities (includes updates in patient status) X- 1 5 Reassessment of Adherence to Treatment Plan ASSESSMENTS - Wound and Skin A ssessment / Reassessment X - Simple Wound Assessment / Reassessment - one wound 1 5 []  - 0 Complex Wound Assessment / Reassessment - multiple wounds []  - 0 Dermatologic / Skin Assessment (not related to wound area) ASSESSMENTS - Focused Assessment []  - 0 Circumferential Edema Measurements - multi extremities []  - 0 Nutritional Assessment / Counseling / Intervention [name], [name] ([id number]) [id number] Page 2 of 9 []  - 0 Lower Extremity Assessment (monofilament, tuning fork, pulses) []  - 0 Peripheral Arterial Disease Assessment (using hand held doppler) ASSESSMENTS - Ostomy and/or Continence Assessment and Care []  - 0 Incontinence Assessment and Management []  - 0 Ostomy Care Assessment and Management (repouching, etc.) PROCESS - Coordination of Care X - Simple Patient / Family Education for ongoing care 1 15 []  - 0 Complex (extensive) Patient / Family Education for ongoing care X- 1 10 Staff obtains Programmer, systems, Records, T Results / Process Orders est []  - 0 Staff telephones HHA, Nursing Homes / Clarify orders / etc []  - 0 Routine Transfer to another Facility (non-emergent condition) []  - 0 Routine Hospital Admission (non-emergent condition) []  - 0 New Admissions / Biomedical engineer / Ordering NPWT Apligraf, etc. , []  - 0 Emergency Hospital Admission (emergent condition) X- 1 10 Simple Discharge Coordination []  - 0 Complex (extensive) Discharge Coordination PROCESS - Special Needs []  - 0 Pediatric / Minor Patient Management []  - 0 Isolation Patient Management []  - 0 Hearing / Language  / Visual special needs []  - 0 Assessment of Community assistance (transportation, D/C planning, etc.) []  - 0 Additional assistance / Altered mentation []  - 0 Support Surface(s) Assessment (bed, cushion, seat, etc.) INTERVENTIONS - Wound Cleansing /  Measurement X - Simple Wound Cleansing - one wound 1 5 []  - 0 Complex Wound Cleansing - multiple wounds X- 1 5 Wound Imaging (photographs - any number of wounds) []  - 0 Wound Tracing (instead of photographs) X- 1 5 Simple Wound Measurement - one wound []  - 0 Complex Wound Measurement - multiple wounds INTERVENTIONS - Wound Dressings []  - 0 Small Wound Dressing one or multiple wounds X- 1 15 Medium Wound Dressing one or multiple wounds []  - 0 Large Wound Dressing one or multiple wounds []  - 0 Application of Medications - topical []  - 0 Application of Medications - injection INTERVENTIONS - Miscellaneous []  - 0 External ear exam []  - 0 Specimen Collection (cultures, biopsies, blood, body fluids, etc.) []  - 0 Specimen(s) / Culture(s) sent or taken to Lab for analysis []  - 0 Patient Transfer (multiple staff / / Similar devices) []  - 0 Simple Staple / Suture removal (25 or less) []  - 0 Complex Staple / Suture removal (26 or more) []  - 0 Hypo / Hyperglycemic Management (close monitor of Blood Glucose) [name], [name] (  .pdf Page 3 of 9 []  - 0 Ankle / Brachial Index (ABI) - do not check if billed separately X- 1 5 Vital Signs Has the patient been seen at the hospital within the last three years: Yes Total Score: 90 Level Of Care: New/Established - Level 3 Electronic Signature(s) Signed: 09/21/2022 8:39:24 AM By: RN Entered By: on 09/15/2022 13:09:11 -------------------------------------------------------------------------------- Encounter Discharge Information Details Patient Name: Date of Service: J.  09/15/2022 12:30 PM Medical Record Number: Patient Account Number: Nurse, adult Date of Birth/Sex: Treating RN: 1951-04-05 (72 y.o. , [name] Primary Care Latonyia Lopata: [name] Other Clinician: Referring Enza Shone: Treating Ashlin Hidalgo/Extender: [id number] in Treatment: 34 Encounter Discharge Information Items Discharge Condition: Stable Ambulatory Status: Ambulatory Discharge Destination: Home Transportation: Private Auto Accompanied By: self Schedule Follow-up Appointment: Yes Clinical Summary of Care: Patient Declined Electronic Signature(s) Signed: 09/21/2022 8:39:24 AM By: 09/23/2022 RN Entered By: Fonnie Mu on 09/15/2022 13:09:47 -------------------------------------------------------------------------------- Lower Extremity Assessment Details Patient Name: Date of Service: 09/17/2022 J. 09/15/2022 12:30 PM Medical Record Number: 09/17/2022 Patient Account Number: [id number] Date of Birth/Sex: Treating RN: May 31, 1951 (72 y.o. 63, [name] Primary Care Terrel Manalo: Ardis Rowan Other Clinician: Referring Chanler Schreiter: Treating Josef Tourigny/Extender: [name] in Treatment: 42 Edema Assessment Assessed: Annamary Rummage: Yes] 45: No] Edema: [Left: Ye] [Right: s] Calf Left: Right: Point of Measurement: 31 cm From Medial Instep 48.5 cm Ankle Left: Right: Point of Measurement: 9 cm From Medial Instep 26.4 cm Vascular Assessment TYNETTA, BACHMANN (Fonnie Mu) [Right:[id number] Page 4 of 9] Pulses: Dorsalis Pedis Palpable: [Left:Yes] Posterior Tibial Palpable: [Left:Yes] Electronic Signature(s) Signed: 09/21/2022 8:39:24 AM By: 09/17/2022 RN Entered By: Christine Dupes on 09/15/2022 12:49:00 -------------------------------------------------------------------------------- Multi Wound Chart Details Patient Name: Date of Service: [id number] J.  09/15/2022 12:30 PM Medical Record Number: 02/20/1951 Patient Account Number: [id number] Date of Birth/Sex: Treating RN: 10-11-1950 (72 y.o. F) Primary Care Emmette Katt: Annamary Rummage Other Clinician: Referring Maley Venezia: Treating Sheresa Cullop/Extender: Kyra Searles in Treatment: 42 Vital Signs Height(in): 67 Pulse(bpm): 51 Weight(lbs): 340 Blood Pressure(mmHg): 154/80 Body Mass Index(BMI): 53.2 Temperature(F): 98.1 Respiratory Rate(breaths/min): 17 [1:Photos:] [N/A:No Photos N/A] Left, Lateral Lower Leg Left, Lateral Lower Leg N/A Wound Location: Trauma Trauma N/A Wounding Event: Trauma, Other Trauma, Other N/A Primary Etiology: Congestive Heart Failure, Congestive Heart Failure, N/A  Comorbid History: Hypertension Hypertension 10/18/2021 10/18/2021 N/A Date Acquired: 42 42 N/A Weeks of Treatment: Open Open N/A Wound Status: No No N/A Wound Recurrence: 1 1 N/A Clustered Quantity: 0.4x0.4x1 0.4x0.4x1 N/A Measurements L x W x D (cm) 0.126 0.126 N/A A (cm) : rea 0.126 0.126 N/A Volume (cm) : 99.90% 99.90% N/A % Reduction in A rea: 100.00% 100.00% N/A % Reduction in Volume: 12 Position 1 (o'clock): 2.2 Maximum Distance 1 (cm): No Yes N/A Tunneling: Full Thickness With Exposed Support Full Thickness With Exposed Support N/A Classification: Structures Structures Medium Medium N/A Exudate Amount: Serosanguineous Serosanguineous N/A Exudate Type: red, brown red, brown N/A Exudate Color: Distinct, outline attached Distinct, outline attached N/A Wound Margin: Large (67-100%) Large (67-100%) N/A Granulation Amount: Red Red N/A Granulation Quality: None Present (0%) None Present (0%) N/A Necrotic Amount: Fat Layer (Subcutaneous Tissue): Yes Fat Layer (Subcutaneous Tissue): Yes N/A Exposed Structures: Fascia: No Fascia: No Tendon: No Tendon: No Muscle: No Muscle: No Joint: No Joint: No Bone: No Bone: No [name], [name]  ([id number]) [id number] Page 5 of 9 Large (67-100%) Large (67-100%) N/A Epithelialization: Induration: Yes Induration: Yes N/A Periwound Skin Texture: Excoriation: No Excoriation: No Callus: No Callus: No Crepitus: No Crepitus: No Rash: No Rash: No Scarring: No Scarring: No Dry/Scaly: Yes Dry/Scaly: Yes N/A Periwound Skin Moisture: Maceration: No Maceration: No Hemosiderin Staining: Yes Hemosiderin Staining: Yes N/A Periwound Skin Color: Atrophie Blanche: No Atrophie Blanche: No Cyanosis: No Cyanosis: No Ecchymosis: No Ecchymosis: No Erythema: No Erythema: No Mottled: No Mottled: No Pallor: No Pallor: No Rubor: No Rubor: No No Abnormality No Abnormality N/A Temperature: Yes Yes N/A Tenderness on Palpation: Treatment Notes Electronic Signature(s) Signed: 09/15/2022 2:31:01 PM By: Kalman Shan DO Entered By: Kalman Shan on 09/15/2022 13:06:16 -------------------------------------------------------------------------------- Multi-Disciplinary Care Plan Details Patient Name: Date of Service: Christine Simmonds J. 09/15/2022 12:30 PM Medical Record Number: [id number] Patient Account Number: [id number] Date of Birth/Sex: Treating RN: Aug 13, 1951 (72 y.o. [name], [name] Primary Care Vipul Cafarelli: Deland Pretty Other Clinician: Referring Mateen Franssen: Treating Kiriana Worthington/Extender: Judie Grieve in Treatment: 65 Active Inactive Abuse / Safety / Falls / Self Care Management Nursing Diagnoses: History of Falls Potential for falls Goals: Patient/caregiver will verbalize/demonstrate measure taken to improve self care Date Initiated: 11/19/2021 Date Inactivated: 04/05/2022 Target Resolution Date: 04/21/2022 Goal Status: Met Patient/caregiver will verbalize/demonstrate measures taken to prevent injury and/or falls Date Initiated: 11/19/2021 Target Resolution Date: 09/22/2022 Goal Status: Active Interventions: Provide  education on basic hygiene Provide education on fall prevention Provide education on HBO safety Provide education on vaccinations Treatment Activities: Education provided on Basic Hygiene : 05/17/2022 Notes: Pain, Acute or Chronic Nursing Diagnoses: Pain, acute or chronic: actual or potential Potential alteration in comfort, pain LILLYN, WIECZOREK ([id number]) [phone].pdf Page 6 of 9 Goals: Patient will verbalize adequate pain control and receive pain control interventions during procedures as needed Date Initiated: 11/19/2021 Date Inactivated: 04/05/2022 Target Resolution Date: 04/22/2022 Goal Status: Met Patient/caregiver will verbalize comfort level met Date Initiated: 11/19/2021 Target Resolution Date: 09/15/2022 Goal Status: Active Interventions: Encourage patient to take pain medications as prescribed Provide education on pain management Reposition patient for comfort Treatment Activities: Administer pain control measures as ordered : 11/19/2021 Notes: Electronic Signature(s) Signed: 09/21/2022 8:39:24 AM By: Rhae Hammock RN Entered By: Rhae Hammock on 09/15/2022 13:08:14 -------------------------------------------------------------------------------- Pain Assessment Details Patient Name: Date of Service: Christine Simmonds J. 09/15/2022 12:30 PM Medical Record Number: [id number] Patient Account Number: [id number] Date of Birth/Sex: Treating RN: 1950-12-11 (  72 y.o. Ardis Rowan, [name] Primary Care Jaqwan Wieber: [name] Other Clinician: Referring Ammaar Encina: Treating Elliot Simoneaux/Extender: Annamary Rummage in Treatment: 66 Active Problems Location of Pain Severity and Description of Pain Patient Has Paino No Site Locations Pain Management and Medication Current Pain Management: Electronic Signature(s) Signed: 09/21/2022 8:39:24 AM By: Fonnie Mu RN Entered By: Fonnie Mu on 09/15/2022 12:48:52 [name],  [name] ([id number]) [id number] Page 7 of 9 -------------------------------------------------------------------------------- Patient/Caregiver Education Details Patient Name: Date of Service: [name] 1/25/2024andnbsp12:30 PM Medical Record Number: [id number] Patient Account Number: [id number] Date of Birth/Gender: Treating RN: 12-16-1950 (72 y.o. Toniann Fail Primary Care Physician: [name] Other Clinician: Referring Physician: Treating Physician/Extender: Annamary Rummage in Treatment: 38 Education Assessment Education Provided To: Patient Education Topics Provided Wound/Skin Impairment: Methods: Explain/Verbal Responses: Reinforcements needed, State content correctly [employer]) Signed: 09/21/2022 8:39:24 AM By: Fonnie Mu RN Entered By: Fonnie Mu on 09/15/2022 13:08:29 -------------------------------------------------------------------------------- Wound Assessment Details Patient Name: Date of Service: Christine Dupes J. 09/15/2022 12:30 PM Medical Record Number: [id number] Patient Account Number: [id number] Date of Birth/Sex: Treating RN: [birth year] (72 y.o. Ardis Rowan, [name] Primary Care Amada Hallisey: [name] Other Clinician: Referring Kaho Selle: Treating Abdishakur Gottschall/Extender: Annamary Rummage in Treatment: 42 Wound Status Wound Number: 1 Primary Etiology: Trauma, Other Wound Location: Left, Lateral Lower Leg Wound Status: Open Wounding Event: Trauma Comorbid History: Congestive Heart Failure, Hypertension Date Acquired: 10/18/2021 Weeks Of Treatment: 42 Clustered Wound: No Photos Wound Measurements Length: (cm) Width: (cm) MENDE, BISWELL ([id number]) Depth: (cm) Clustered Quantity: Area: (cm) Volume: (cm) 0.4 % Reduction in Area: 99.9% 0.4 % Reduction in Volume: 100% [id number] Page 8 of 9 1  Epithelialization: Large (67-100%) 1 Tunneling: No 0.126 Undermining: No 0.126 Wound Description Classification: Full Thickness With Exposed Support Structures Wound Margin: Distinct, outline attached Exudate Amount: Medium Exudate Type: Serosanguineous Exudate Color: red, brown Foul Odor After Cleansing: No Slough/Fibrino No Wound Bed Granulation Amount: Large (67-100%) Exposed Structure Granulation Quality: Red Fascia Exposed: No Necrotic Amount: None Present (0%) Fat Layer (Subcutaneous Tissue) Exposed: Yes Tendon Exposed: No Muscle Exposed: No Joint Exposed: No Bone Exposed: No Periwound Skin Texture Texture Color No Abnormalities Noted: No No Abnormalities Noted: No Callus: No Atrophie Blanche: No Crepitus: No Cyanosis: No Excoriation: No Ecchymosis: No Induration: Yes Erythema: No Rash: No Hemosiderin Staining: Yes Scarring: No Mottled: No Pallor: No Moisture Rubor: No No Abnormalities Noted: No Dry / Scaly: Yes Temperature / Pain Maceration: No Temperature: No Abnormality Tenderness on Palpation: Yes Electronic Signature(s) Signed: 09/21/2022 8:39:24 AM By: Fonnie Mu RN Entered By: Fonnie Mu on 09/15/2022 12:53:38 -------------------------------------------------------------------------------- Wound Assessment Details Patient Name: Date of Service: Christine Dupes J. 09/15/2022 12:30 PM Medical Record Number: [id number] Patient Account Number: [id number] Date of Birth/Sex: Treating RN: 04/08/1951 (72 y.o. Ardis Rowan, [name] Primary Care Tandre Conly: [name] Other Clinician: Referring Viona Hosking: Treating Ramesses Crampton/Extender: Annamary Rummage in Treatment: 42 Wound Status Wound Number: 1 Primary Etiology: Trauma, Other Wound Location: Left, Lateral Lower Leg Wound Status: Open Wounding Event: Trauma Comorbid History: Congestive Heart Failure, Hypertension Date Acquired: 10/18/2021 Weeks Of Treatment:  42 Clustered Wound: No Wound Measurements Length: (cm) 0.4 Width: (cm) 0.4 Depth: (cm) 1 Clustered Quantity: 1 Area: (cm) 0.126 Volume: (cm) 0.126 [name], Christine J ([id number]) % Reduction in Area: 99.9% % Reduction in Volume: 100% Epithelialization: Large (67-100%) Tunneling: Yes Position (o'clock): 12 Maximum Distance: (cm) 2.2 [id number] Page 9 of 9 Undermining: No Wound  Description Classification: Full Thickness With Exposed Support Structures Wound Margin: Distinct, outline attached Exudate Amount: Medium Exudate Type: Serosanguineous Exudate Color: red, brown Foul Odor After Cleansing: No Slough/Fibrino No Wound Bed Granulation Amount: Large (67-100%) Exposed Structure Granulation Quality: Red Fascia Exposed: No Necrotic Amount: None Present (0%) Fat Layer (Subcutaneous Tissue) Exposed: Yes Tendon Exposed: No Muscle Exposed: No Joint Exposed: No Bone Exposed: No Periwound Skin Texture Texture Color No Abnormalities Noted: No No Abnormalities Noted: No Callus: No Atrophie Blanche: No Crepitus: No Cyanosis: No Excoriation: No Ecchymosis: No Induration: Yes Erythema: No Rash: No Hemosiderin Staining: Yes Scarring: No Mottled: No Pallor: No Moisture Rubor: No No Abnormalities Noted: No Dry / Scaly: Yes Temperature / Pain Maceration: No Temperature: No Abnormality Tenderness on Palpation: Yes Electronic Signature(s) Signed: 09/21/2022 8:39:24 AM By: Rhae Hammock RN Entered By: Rhae Hammock on 09/15/2022 12:56:19 -------------------------------------------------------------------------------- Vitals Details Patient Name: Date of Service: Christine Debar [name] 09/15/2022 12:30 PM Medical Record Number: [id number] Patient Account Number: [id number] Date of Birth/Sex: Treating RN: 01-11-1951 (72 y.o. [name], [name] Primary Care Fadel Clason: Deland Pretty Other Clinician: Referring Tray Klayman: Treating  Tamber Burtch/Extender: Judie Grieve in Treatment: 42 Vital Signs Time Taken: 12:46 Temperature (F): 98.1 Height (in): 67 Pulse (bpm): 51 Weight (lbs): 340 Respiratory Rate (breaths/min): 17 Body Mass Index (BMI): 53.2 Blood Pressure (mmHg): 154/80 Reference Range: 80 - 120 mg / dl Electronic Signature(s) Signed: 09/21/2022 8:39:24 AM By: Rhae Hammock RN Entered By: Rhae Hammock on 09/15/2022 12:48:47

## 2022-09-23 DIAGNOSIS — F325 Major depressive disorder, single episode, in full remission: Secondary | ICD-10-CM | POA: Diagnosis not present

## 2022-09-23 DIAGNOSIS — T24332D Burn of third degree of left lower leg, subsequent encounter: Secondary | ICD-10-CM | POA: Diagnosis not present

## 2022-09-23 DIAGNOSIS — Z9181 History of falling: Secondary | ICD-10-CM | POA: Diagnosis not present

## 2022-09-23 DIAGNOSIS — I4891 Unspecified atrial fibrillation: Secondary | ICD-10-CM | POA: Diagnosis not present

## 2022-09-23 DIAGNOSIS — E039 Hypothyroidism, unspecified: Secondary | ICD-10-CM | POA: Diagnosis not present

## 2022-09-23 DIAGNOSIS — J309 Allergic rhinitis, unspecified: Secondary | ICD-10-CM | POA: Diagnosis not present

## 2022-09-23 DIAGNOSIS — I110 Hypertensive heart disease with heart failure: Secondary | ICD-10-CM | POA: Diagnosis not present

## 2022-09-23 DIAGNOSIS — Z9049 Acquired absence of other specified parts of digestive tract: Secondary | ICD-10-CM | POA: Diagnosis not present

## 2022-09-23 DIAGNOSIS — Z96653 Presence of artificial knee joint, bilateral: Secondary | ICD-10-CM | POA: Diagnosis not present

## 2022-09-23 DIAGNOSIS — D500 Iron deficiency anemia secondary to blood loss (chronic): Secondary | ICD-10-CM | POA: Diagnosis not present

## 2022-09-23 DIAGNOSIS — Z6841 Body Mass Index (BMI) 40.0 and over, adult: Secondary | ICD-10-CM | POA: Diagnosis not present

## 2022-09-23 DIAGNOSIS — I5032 Chronic diastolic (congestive) heart failure: Secondary | ICD-10-CM | POA: Diagnosis not present

## 2022-09-27 ENCOUNTER — Encounter (HOSPITAL_BASED_OUTPATIENT_CLINIC_OR_DEPARTMENT_OTHER): Payer: PPO | Attending: Internal Medicine | Admitting: Internal Medicine

## 2022-09-27 DIAGNOSIS — L97822 Non-pressure chronic ulcer of other part of left lower leg with fat layer exposed: Secondary | ICD-10-CM | POA: Insufficient documentation

## 2022-09-27 DIAGNOSIS — I872 Venous insufficiency (chronic) (peripheral): Secondary | ICD-10-CM | POA: Diagnosis not present

## 2022-09-27 DIAGNOSIS — M1710 Unilateral primary osteoarthritis, unspecified knee: Secondary | ICD-10-CM | POA: Diagnosis not present

## 2022-09-27 DIAGNOSIS — I890 Lymphedema, not elsewhere classified: Secondary | ICD-10-CM | POA: Insufficient documentation

## 2022-09-27 DIAGNOSIS — I130 Hypertensive heart and chronic kidney disease with heart failure and stage 1 through stage 4 chronic kidney disease, or unspecified chronic kidney disease: Secondary | ICD-10-CM | POA: Insufficient documentation

## 2022-09-27 DIAGNOSIS — F419 Anxiety disorder, unspecified: Secondary | ICD-10-CM | POA: Insufficient documentation

## 2022-09-27 DIAGNOSIS — E039 Hypothyroidism, unspecified: Secondary | ICD-10-CM | POA: Diagnosis not present

## 2022-09-27 DIAGNOSIS — W19XXXA Unspecified fall, initial encounter: Secondary | ICD-10-CM | POA: Insufficient documentation

## 2022-09-27 DIAGNOSIS — T798XXA Other early complications of trauma, initial encounter: Secondary | ICD-10-CM | POA: Diagnosis not present

## 2022-09-27 DIAGNOSIS — Z7901 Long term (current) use of anticoagulants: Secondary | ICD-10-CM | POA: Diagnosis not present

## 2022-09-27 DIAGNOSIS — N189 Chronic kidney disease, unspecified: Secondary | ICD-10-CM | POA: Insufficient documentation

## 2022-09-27 DIAGNOSIS — I5032 Chronic diastolic (congestive) heart failure: Secondary | ICD-10-CM | POA: Insufficient documentation

## 2022-09-27 DIAGNOSIS — I480 Paroxysmal atrial fibrillation: Secondary | ICD-10-CM | POA: Diagnosis not present

## 2022-09-27 DIAGNOSIS — I87312 Chronic venous hypertension (idiopathic) with ulcer of left lower extremity: Secondary | ICD-10-CM | POA: Insufficient documentation

## 2022-09-27 NOTE — Progress Notes (Signed)
[name], [name] ([id number]) [id number] Page 1 of 9 Visit Report for 09/27/2022 Arrival Information Details Patient Name: Date of Service: [name] 09/27/2022 10:15 A M Medical Record Number: [id number] Patient Account Number: [id number] Date of Birth/Sex: Treating RN: [birth year] (72 y.o. F) Primary Care Kimaria Struthers: Deland Pretty Other Clinician: Referring Chia Mowers: Treating Lounette Sloan/Extender: Judie Grieve in Treatment: 55 Visit Information History Since Last Visit All ordered tests and consults were completed: No Patient Arrived: [name] Added or deleted any medications: No Arrival Time: 10:05 Any new allergies or adverse reactions: No Accompanied By: son Had a fall or experienced change in No Transfer Assistance: None activities of daily living that may affect Patient Identification Verified: Yes risk of falls: Secondary Verification Process Completed: Yes Signs or symptoms of abuse/neglect since last visito No Patient Requires Transmission-Based Precautions: No Hospitalized since last visit: No Patient Has Alerts: Yes Implantable device outside of the clinic excluding No Patient Alerts: Patient on Blood Thinner cellular tissue based products placed in the center since last visit: Pain Present Now: No Electronic Signature(s) Signed: 09/27/2022 10:20:56 AM By: Worthy Rancher Entered By: Worthy Rancher on 09/27/2022 10:06:07 -------------------------------------------------------------------------------- Clinic Level of Care Assessment Details Patient Name: Date of Service: [name] 09/27/2022 10:15 A M Medical Record Number: [id number] Patient Account Number: [id number] Date of Birth/Sex: Treating RN: 1951-06-28 (72 y.o. [name] Primary Care Lashan Gluth: Deland Pretty Other Clinician: Referring Brennen Gardiner: Treating Anna Beaird/Extender: Judie Grieve in Treatment: 6 Clinic Level of  Care Assessment Items TOOL 4 Quantity Score X- 1 0 Use when only an EandM is performed on FOLLOW-UP visit ASSESSMENTS - Nursing Assessment / Reassessment X- 1 10 Reassessment of Co-morbidities (includes updates in patient status) X- 1 5 Reassessment of Adherence to Treatment Plan ASSESSMENTS - Wound and Skin A ssessment / Reassessment X - Simple Wound Assessment / Reassessment - one wound 1 5 []  - 0 Complex Wound Assessment / Reassessment - multiple wounds X- 1 10 Dermatologic / Skin Assessment (not related to wound area) ASSESSMENTS - Focused Assessment X- 1 5 Circumferential Edema Measurements - multi extremities []  - 0 Nutritional Assessment / Counseling / Intervention YITZEL, SHASTEEN ([id number]) [id number] Page 2 of 9 []  - 0 Lower Extremity Assessment (monofilament, tuning fork, pulses) []  - 0 Peripheral Arterial Disease Assessment (using hand held doppler) ASSESSMENTS - Ostomy and/or Continence Assessment and Care []  - 0 Incontinence Assessment and Management []  - 0 Ostomy Care Assessment and Management (repouching, etc.) PROCESS - Coordination of Care X - Simple Patient / Family Education for ongoing care 1 15 []  - 0 Complex (extensive) Patient / Family Education for ongoing care X- 1 10 Staff obtains Programmer, systems, Records, T Results / Process Orders est X- 1 10 Staff telephones HHA, Nursing Homes / Clarify orders / etc []  - 0 Routine Transfer to another Facility (non-emergent condition) []  - 0 Routine Hospital Admission (non-emergent condition) []  - 0 New Admissions / Biomedical engineer / Ordering NPWT Apligraf, etc. , []  - 0 Emergency Hospital Admission (emergent condition) X- 1 10 Simple Discharge Coordination []  - 0 Complex (extensive) Discharge Coordination PROCESS - Special Needs []  - 0 Pediatric / Minor Patient Management []  - 0 Isolation Patient Management []  - 0 Hearing / Language / Visual special needs []  -  0 Assessment of Community assistance (transportation, D/C planning, etc.) []  - 0 Additional assistance / Altered mentation []  - 0 Support Surface(s) Assessment (bed, cushion, seat, etc.) INTERVENTIONS - Wound Cleansing /  Measurement X - Simple Wound Cleansing - one wound 1 5 []  - 0 Complex Wound Cleansing - multiple wounds X- 1 5 Wound Imaging (photographs - any number of wounds) []  - 0 Wound Tracing (instead of photographs) X- 1 5 Simple Wound Measurement - one wound []  - 0 Complex Wound Measurement - multiple wounds INTERVENTIONS - Wound Dressings X - Small Wound Dressing one or multiple wounds 1 10 []  - 0 Medium Wound Dressing one or multiple wounds []  - 0 Large Wound Dressing one or multiple wounds []  - 0 Application of Medications - topical []  - 0 Application of Medications - injection INTERVENTIONS - Miscellaneous []  - 0 External ear exam []  - 0 Specimen Collection (cultures, biopsies, blood, body fluids, etc.) []  - 0 Specimen(s) / Culture(s) sent or taken to Lab for analysis []  - 0 Patient Transfer (multiple staff / Civil Service fast streamer / Similar devices) []  - 0 Simple Staple / Suture removal (25 or less) []  - 0 Complex Staple / Suture removal (26 or more) []  - 0 Hypo / Hyperglycemic Management (close monitor of Blood Glucose) ABRIELLE, FINCK ([id number]) [id number] Page 3 of 9 []  - 0 Ankle / Brachial Index (ABI) - do not check if billed separately X- 1 5 Vital Signs Has the patient been seen at the hospital within the last three years: Yes Total Score: 110 Level Of Care: New/Established - Level 3 Electronic Signature(s) Unsigned Entered By: Deon Pilling on 09/27/2022 10:47:20 -------------------------------------------------------------------------------- Encounter Discharge Information Details Patient Name: Date of Service: [name] 09/27/2022 10:15 A M Medical Record Number: [id number] Patient Account Number:  [id number] Date of Birth/Sex: Treating RN: Oct 13, 1950 (72 y.o. [name] Primary Care Jeanenne Licea: Deland Pretty Other Clinician: Referring Natalea Sutliff: Treating Doak Mah/Extender: Judie Grieve in Treatment: 29 Encounter Discharge Information Items Discharge Condition: Stable Ambulatory Status: Walker Discharge Destination: Home Transportation: Private Auto Accompanied By: self Schedule Follow-up Appointment: Yes Clinical Summary of Care: Electronic Signature(s) Unsigned Entered By: Deon Pilling on 09/27/2022 10:47:58 -------------------------------------------------------------------------------- Lower Extremity Assessment Details Patient Name: Date of Service: [name] 09/27/2022 10:15 A M Medical Record Number: [id number] Patient Account Number: [id number] Date of Birth/Sex: Treating RN: Jan 19, 1951 (72 y.o. F) Primary Care Oral Hallgren: Deland Pretty Other Clinician: Referring Vickki Igou: Treating Cameka Rae/Extender: Judie Grieve in Treatment: 44 Edema Assessment Assessed: [Left: No] [Right: No] Edema: [Left: Ye] [Right: s] Calf Left: Right: Point of Measurement: 31 cm From Medial Instep 46.5 cm Ankle Left: Right: Point of Measurement: 9 cm From Medial Instep 26 cm DOAA, KENDZIERSKI ([id number]) [id number] Page 4 of 9 Electronic Signature(s) Unsigned Entered By: Erenest Blank on 09/27/2022 10:28:52 -------------------------------------------------------------------------------- Multi Wound Chart Details Patient Name: Date of Service: [name] 09/27/2022 10:15 A M Medical Record Number: [id number] Patient Account Number: [id number] Date of Birth/Sex: Treating RN: [birth year] (72 y.o. F) Primary Care Chanele Douglas: Deland Pretty Other Clinician: Referring Joseandres Mazer: Treating Vadie Principato/Extender: Judie Grieve in Treatment: 44 Vital Signs Height(in):  67 Pulse(bpm): 60 Weight(lbs): 340 Blood Pressure(mmHg): 165/75 Body Mass Index(BMI): 53.2 Temperature(F): 97.8 Respiratory Rate(breaths/min): 20 [1:Photos:] [N/A:N/A] Left, Lateral Lower Leg N/A N/A Wound Location: Trauma N/A N/A Wounding Event: Lymphedema N/A N/A Primary Etiology: Congestive Heart Failure, N/A N/A Comorbid History: Hypertension 10/18/2021 N/A N/A Date Acquired: 58 N/A N/A Weeks of Treatment: Open N/A N/A Wound Status: No N/A N/A Wound Recurrence: 1 N/A N/A Clustered Quantity: 0.3x0.3x1.1 N/A N/A Measurements L x W x D (cm) 0.071 N/A  N/A A (cm) : rea 0.078 N/A N/A Volume (cm) : 100.00% N/A N/A % Reduction in Area: 100.00% N/A N/A % Reduction in Volume: Full Thickness With Exposed Support N/A N/A Classification: Structures Medium N/A N/A Exudate Amount: Serosanguineous N/A N/A Exudate Type: red, brown N/A N/A Exudate Color: Distinct, outline attached N/A N/A Wound Margin: Large (67-100%) N/A N/A Granulation Amount: Red N/A N/A Granulation Quality: None Present (0%) N/A N/A Necrotic Amount: Fat Layer (Subcutaneous Tissue): Yes N/A N/A Exposed Structures: Fascia: No Tendon: No Muscle: No Joint: No Bone: No Large (67-100%) N/A N/A Epithelialization: Induration: Yes N/A N/A Periwound Skin Texture: Excoriation: No Callus: No Crepitus: No Rash: No Scarring: No TYLASIA, FLETCHALL ([id number]) [id number] Page 5 of 9 Dry/Scaly: Yes N/A N/A Periwound Skin Moisture: Maceration: No Hemosiderin Staining: Yes N/A N/A Periwound Skin Color: Atrophie Blanche: No Cyanosis: No Ecchymosis: No Erythema: No Mottled: No Pallor: No Rubor: No No Abnormality N/A N/A Temperature: Yes N/A N/A Tenderness on Palpation: Treatment Notes Electronic Signature(s) Unsigned Entered By: Kalman Shan on 09/27/2022  10:44:51 -------------------------------------------------------------------------------- Multi-Disciplinary Care Plan Details Patient Name: Date of Service: [name] 09/27/2022 10:15 A M Medical Record Number: [id number] Patient Account Number: [id number] Date of Birth/Sex: Treating RN: 02-17-1951 (72 y.o. [name], Meta.Reding Primary Care Chadrick Sprinkle: Deland Pretty Other Clinician: Referring Lasondra Hodgkins: Treating Vivyan Biggers/Extender: Judie Grieve in Treatment: 1 Active Inactive Abuse / Safety / Falls / Self Care Management Nursing Diagnoses: History of Falls Potential for falls Goals: Patient/caregiver will verbalize/demonstrate measure taken to improve self care Date Initiated: 11/19/2021 Date Inactivated: 04/05/2022 Target Resolution Date: 04/21/2022 Goal Status: Met Patient/caregiver will verbalize/demonstrate measures taken to prevent injury and/or falls Date Initiated: 11/19/2021 Target Resolution Date: 10/21/2022 Goal Status: Active Interventions: Provide education on basic hygiene Provide education on fall prevention Provide education on HBO safety Provide education on vaccinations Treatment Activities: Education provided on Basic Hygiene : 05/17/2022 Notes: Pain, Acute or Chronic Nursing Diagnoses: Pain, acute or chronic: actual or potential Potential alteration in comfort, pain Goals: Patient will verbalize adequate pain control and receive pain control interventions during procedures as needed Date Initiated: 11/19/2021 Date Inactivated: 04/05/2022 Target Resolution Date: 04/22/2022 Goal Status: Met BEUNA, BOLDING ([id number]) [id number] Page 6 of 9 Patient/caregiver will verbalize comfort level met Date Initiated: 11/19/2021 Target Resolution Date: 10/21/2022 Goal Status: Active Interventions: Encourage patient to take pain medications as prescribed Provide education on pain management Reposition patient for  comfort Treatment Activities: Administer pain control measures as ordered : 11/19/2021 Notes: Electronic Signature(s) Unsigned Entered By: Deon Pilling on 09/27/2022 10:46:32 -------------------------------------------------------------------------------- Pain Assessment Details Patient Name: Date of Service: [name] 09/27/2022 10:15 A M Medical Record Number: [id number] Patient Account Number: [id number] Date of Birth/Sex: Treating RN: [birth year] (72 y.o. F) Primary Care Alinna Siple: Deland Pretty Other Clinician: Referring Clevester Helzer: Treating Issam Carlyon/Extender: Judie Grieve in Treatment: 22 Active Problems Location of Pain Severity and Description of Pain Patient Has Paino No Site Locations Pain Management and Medication Current Pain Management: Electronic Signature(s) Signed: 09/27/2022 10:20:56 AM By: Worthy Rancher Entered By: Worthy Rancher on 09/27/2022 10:06:42 Georgiana Spinner ([id number]) [id number] Page 7 of 9 -------------------------------------------------------------------------------- Patient/Caregiver Education Details Patient Name: Date of Service: [name] 2/6/2024andnbsp10:15 A M Medical Record Number: [id number] Patient Account Number: [id number] Date of Birth/Gender: Treating RN: Nov 14, 1950 (72 y.o. [name] Primary Care Physician: Deland Pretty Other Clinician: Referring Physician: Treating Physician/Extender: Judie Grieve in Treatment: [phone] Education  Assessment Education Provided To: Patient Education Topics Provided Wound/Skin Impairment: Handouts: Caring for Your Ulcer Methods: Explain/Verbal Responses: Reinforcements needed Electronic Signature(s) Unsigned Entered By: Shawn Stall on 09/27/2022 10:46:46 -------------------------------------------------------------------------------- Wound Assessment Details Patient Name: Date of Service: [name] 09/27/2022 10:15 A M Medical Record Number: [id number] Patient Account Number: [id number] Date of Birth/Sex: Treating RN: [birth year] (72 y.o. Debara Pickett, Millard.Loa Primary Care Adlee Paar: [name] Other Clinician: Referring Koda Defrank: Treating Glory Graefe/Extender: Annamary Rummage in Treatment: 44 Wound Status Wound Number: 1 Primary Etiology: Lymphedema Wound Location: Left, Lateral Lower Leg Wound Status: Open Wounding Event: Trauma Comorbid History: Congestive Heart Failure, Hypertension Date Acquired: 10/18/2021 Weeks Of Treatment: 44 Clustered Wound: No Photos Wound Measurements ALIN, HUTCHINS ([id number]) Length: (cm) 0.3 Width: (cm) 0.3 Depth: (cm) 1.1 Clustered Quantity: 1 Area: (cm) 0.071 Volume: (cm) 0.078 [id number] Page 8 of 9 % Reduction in Area: 100% % Reduction in Volume: 100% Epithelialization: Large (67-100%) Tunneling: No Undermining: No Wound Description Classification: Full Thickness With Exposed Support Structures Wound Margin: Distinct, outline attached Exudate Amount: Medium Exudate Type: Serosanguineous Exudate Color: red, brown Foul Odor After Cleansing: No Slough/Fibrino No Wound Bed Granulation Amount: Large (67-100%) Exposed Structure Granulation Quality: Red Fascia Exposed: No Necrotic Amount: None Present (0%) Fat Layer (Subcutaneous Tissue) Exposed: Yes Tendon Exposed: No Muscle Exposed: No Joint Exposed: No Bone Exposed: No Periwound Skin Texture Texture Color No Abnormalities Noted: No No Abnormalities Noted: No Callus: No Atrophie Blanche: No Crepitus: No Cyanosis: No Excoriation: No Ecchymosis: No Induration: Yes Erythema: No Rash: No Hemosiderin Staining: Yes Scarring: No Mottled: No Pallor: No Moisture Rubor: No No Abnormalities Noted: No Dry / Scaly: Yes Temperature / Pain Maceration: No Temperature: No Abnormality Tenderness on Palpation:  Yes Treatment Notes Wound #1 (Lower Leg) Wound Laterality: Left, Lateral Cleanser Wound Cleanser Discharge Instruction: Cleanse the wound with wound cleanser prior to applying a clean dressing using gauze sponges, not tissue or cotton balls. Peri-Wound Care Ketoconazole Cream 2% Discharge Instruction: Apply to periwound in clinic. you use clobetasol at home. Topical Primary Dressing Endoform 2x2 in Discharge Instruction: Moisten with saline Compounding topical antibiotics Discharge Instruction: apply antibiotic moisted gauze lightly packed into wound bed. Secondary Dressing ABD Pad, 5x9 Discharge Instruction: Apply over primary dressing as directed. Woven Gauze Sponge, Non-Sterile 4x4 in Discharge Instruction: Apply over primary dressing as directed. Secured With Elastic Bandage 4 inch (ACE bandage) Discharge Instruction: Secure with ACE bandage as directed. Kerlix Roll Sterile, 4.5x3.1 (in/yd) Discharge Instruction: Secure with Kerlix as directed. 38M Medipore H Soft Cloth Surgical T ape, 4 x 10 (in/yd) Discharge Instruction: Secure with tape as directed. AADYA, KINDLER ([id number]) [id number] Page 9 of 9 Compression Wrap Compression Stockings Add-Ons Electronic Signature(s) Unsigned Entered By: Shawn Stall on 09/27/2022 10:43:16 -------------------------------------------------------------------------------- Vitals Details Patient Name: Date of Service: [name] 09/27/2022 10:15 A M Medical Record Number: [id number] Patient Account Number: [id number] Date of Birth/Sex: Treating RN: 1951/08/07 (72 y.o. F) Primary Care Alaa Eyerman: [name] Other Clinician: Referring Malai Lady: Treating Teryl Gubler/Extender: Annamary Rummage in Treatment: 22 Vital Signs Time Taken: 10:06 Temperature (F): 97.8 Height (in): 67 Pulse (bpm): 60 Weight (lbs): 340 Respiratory Rate (breaths/min): 20 Body Mass Index (BMI):  53.2 Blood Pressure (mmHg): 165/75 Reference Range: 80 - 120 mg / dl Electronic Signature(s) Signed: 09/27/2022 10:20:56 AM By: Dayton Scrape Entered By: Dayton Scrape on 09/27/2022 10:06:35

## 2022-09-27 NOTE — Progress Notes (Signed)
[name], [name] ([id number]) [id number] Page 1 of 10 Visit Report for 09/27/2022 Chief Complaint Document Details Patient Name: Date of Service: [name] 09/27/2022 10:15 A M Medical Record Number: [id number] Patient Account Number: [id number] Date of Birth/Sex: Treating RN: September 05, 1950 (72 y.o. F) Primary Care Provider: Deland Pretty Other Clinician: Referring Provider: Treating Provider/Extender: Judie Grieve in [id number] Information Obtained from: Patient Chief Complaint 11/19/2021; Left lower extremity wound status post fall Electronic Signature(s) Signed: 09/27/2022 1:42:51 PM By: Kalman Shan DO Entered By: Kalman Shan on 09/27/2022 10:45:02 -------------------------------------------------------------------------------- HPI Details Patient Name: Date of Service: [name] [name] 09/27/2022 10:15 A M Medical Record Number: [id number] Patient Account Number: [id number] Date of Birth/Sex: Treating RN: 11-Sep-1950 (72 y.o. F) Primary Care Provider: Deland Pretty Other Clinician: Referring Provider: Treating Provider/Extender: Judie Grieve in Treatment: 73 History of Present Illness HPI Description: Admission 11/19/2021 [name] is a 72 year old female with a past medical history of paroxysmal A-fib on Eliquis, hypothyroidism, major depressive disorder, venous insufficiency and chronic diastolic heart failure that presents to the clinic for a 1 month history of nonhealing wound to the left lower extremity. She visited the ED on 10/18/2021 after a mechanical fall. She developed a hematoma that subsequently opened. She was hospitalized for 7 days and discharged on 10/25/2021. She has been on several different antibiotics For the past month. She states that most recently she was on Levaquin and linezolid for the past week. She states she completes her antibiotic course tomorrow.  She has been using Dakin's wet-to-dry dressings to the wound bed. She denies signs of infection. 4/7; patient presents for follow-up. She has been using Dakin's wet-to-dry dressings. She did end up going to the ED on 4/1 because she had excess bleeding with dressing change that she could not stop. She is on Eliquis for A-fib. In the ED they tied off a small artery. She has had no issues since discharge. She denies signs of infection. 4/14; this is a very difficult clinical situation. A patient with underlying chronic venous insufficiency and lymphedema very significant lower extremity edema had a hematoma after a fall on her left upper lateral lower leg. She is on Eliquis for atrial fibrillation apparently with a history of a splenic infarct following with Dr. Jolyn Nap of cardiology. She has exhibited significant bleeding from the wound surface including ao Venous bleeder that required suturing short while ago. She has been using Dakin's wet-to-dry packing and over the surface of the wound. She saw Dr. Caryl Comes yesterday he is reluctant to consider stopping the Eliquis because of the prior history of presumed cardioembolism. Wants to communicate with Dr. Heber [place] when she returns. In a perfect world where she was not on Eliquis she requires a wound VAC with additional compression wraps but I understand the reluctance to do this because of the concerns of bleeding 4/28; the patient's wound actually looks better today using Dakin's wet-to-dry that she is changing twice a day she is wrapping this with Kerlix and Ace wrapping. She tells me she had 2 small bleeding areas which were part of the superficial wound that stopped this week with direct pressure. Other than that no major issues. In follow-up from discussion of last week Dr. 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  The bigger question from my point of view is the wound VAC issue. As far as she knows and her daughter-in-law verifies that she has not had any bleeding from the deeper parts of the wound although the bleeding has been superficial including the one that sent her to the ER for stitches. She is concerned that a wound VAC would cause further bleeding and I cannot completely allay those concerns. [name] ([id number]) [id number] Page 2 of 10 5/8; patient presents for follow-up. She has no issues or complaints today. She has been using Dakin's wet-to-dry dressings without issues. She denies signs of infection. 5/12; patient presents for follow-up. She continues to use Dakin's wet-to-dry dressings without issues. She denies signs of infection. She reports some issues with bleeding at times but this has improved. She denies signs of infection. 6/1; patient presents for follow-up. She was recently hospitalized for upper left leg thigh cellulitis. She was given IV cefepime and vancomycin and discharged on oral antibiotics. She has been using Dakin's wet-to-dry dressings to the left lower leg wound. She reports improvement in healing. She currently denies systemic signs of infection. 6/7; patient is using Dakin's wet-to-dry twice daily. In general this looks better than when I saw this a month or so ago however still considerable depth to the tunnel in her left leg. She is going for iron infusions ordered by her primary care doctor I believe 6/15; patient presents for follow-up. She has been using Dakin's wet-to-dry dressings. She has noted some scattered small areas that blister up and heal on her left lower leg. She has been using mupirocin ointment on them. Nothing open today. 6/23; patient's been using Dakin's wet-to-dry dressings to the tunneled wound and Hydrofera Blue to the opening. She has no issues or complaints today. 6/29;  patient presents for follow-up. She has been using Dakin's wet-to-dry dressings to the tunneled wound and Hydrofera Blue to the opening. She states that [facility] is sticking to the wound bed. She denies signs of infection. 7/7; patient presents for follow-up. She has been using Dakin's wet-to-dry dressings to the tunneled wound and PolyMem silver to the opening without issues. 7/13; patient presents for follow-up. She has been using PolyMem silver to the opening and the rope to the tunnel. She has no issues or complaints today. She denies signs of infection. 7/20; patient presents for follow-up. We have been using PolyMem silver to the wound bed. She has no issues or complaints today. She is receiving her custom compression garments tomorrow. 7/27; patient presents for follow-up. She has been using PolyMem silver to the wound bed. She is having her custom compression garments adjusted as these are not staying on very easily. 8/30; patient presents for follow-up. We have been using PolyMem silver to the wound bed. She is still waiting on her custom compression garments to be ordered. She denies signs of infection. 8/11; patient presents for follow-up. The wound VAC has been started and patient has been using this for the past week. Patient has home health who changes the wound VAC. She developed irritation to the periwound. DuoDERM was not being used to the periwound despite being ordered. 8/15; patient presents for follow-up. She restarted the wound VAC and has had improvement to the periwound with the use of DuoDERM. 8/24; patient presents for follow-up. She has been using the wound VAC. She has developed a wound just superior to the original wound. Likely as a result from the wound VAC. She denies signs of infection. 8/29;  patient presents for follow-up. We have been using collagen to the wound bed. She has held off on the wound VAC for the past week. 9/7; patient presents for follow-up. We  have been using iodoform packing to the wound bed. She has mild tenderness to the periwound. She denies systemic signs of infection. 9/12; patient presents for follow-up. Patient has been using Dakin's wet-to-dry dressings. She has no issues or complaints today. She denies signs of infection. 9/21; patient presents for follow-up. PuraPly #1 was placed in standard fashion at last clinic visit. She has no issues or complaints today. She denies signs of infection. 10/3; patient presents for follow-up. PuraPly #2 was placed in standard fashion at last clinic visit. She has no issues or complaints today. She denies signs of infection. 10/10; patient presents for follow-up. PuraPly #3 was placed in standard fashion at last clinic visit. She has no issues or complaints today. She denies signs of infection. 10/17; patient presents for follow-up. She has been using silver alginate to the tunneled wound. She has no issues or complaints today. 10/31; patient presents for follow-up. We have been doing Dakin's wet-to-dry dressings to the tunneled wound. The wound VAC has been ordered however patient has not received this. She currently denies signs of infection. She reports a lot of serosanguineous drainage. 11/14; patient presents for follow-up. She had a PCR culture done at last clinic visit that grew Pseudomonas aeruginosa. She obtained Keystone antibiotic spray yesterday. She also obtained her ultrasound of the left lower extremity to assess for abscess however the vascular lab ordered On erroneous test. This has been rectified. Patient was not charged for the test. DVT study showed no DVT T also reported to our nurse that they did not notice any abscess s. Aletha Halim while doing the DVT rule out. 11/21; patient presents for follow-up. She has been using Keystone antibiotic with gauze packing. She has no issues or complaints today. 11/30; patient presents for follow-up. She has been using Keystone antibiotic with  gauze packing. Depth is slightly larger today. She denies signs of infection. 12/5; patient presents for follow-up. She has been using Keystone antibiotic spray with collagen. She has no issues or complaints today. She has been using an Ace wrap for compression therapy. 12/12; patient presents for follow-up. She has been using Keystone antibiotic spray with collagen and Ace wrap for compression. She states that the Ace wrap falls down during the day. She is only able to use this at night. 12/19; patient presents for follow-up. She has been using Keystone antibiotic spray with collagen. She is unable to use an Ace wrap during the day because she states it falls down however she is able to use it at night without issues. She denies signs of infection. 12/26; patient presents for follow-up. She has been using Keystone antibiotic ointment with collagen. She uses an Ace wrap to help with compression at night. She has no issues or complaints today. 1/4; patient presents for follow-up. She is been using Keystone antibiotic with collagen. She uses the Ace wrap at night. She tries to use it in the daytime as well she has no issues or complaints today. 1/11; patient presents for follow-up. She has been using Keystone antibiotic ointment with collagen. She uses the Ace wrap at night T help with compression. o She has no issues or complaints today. 1/25; patient presents for follow-up. She has been using Keystone antibiotic ointment with collagen. Wound is stable. She uses ace wraps however during the [place], [place]  J ([id number]) [id number] Page 3 of 10 day these do not stay in place. She is able to use it at night. She has no issues or complaints today. 1/30; patient presents for follow-up. She has been using Keystone antibiotic ointment with endoform. She continues to use ace wraps. She has no issues or complaints today. There is been improvement in wound healing. 2/6;  patient presents for follow-up. She has been using Keystone antibiotic ointment with endoform. She has been using her juxta fit without issues. She does have some irritation to the periwound. Other than that she has no issues or complaints today. Electronic Signature(s) Signed: 09/27/2022 1:42:51 PM By: Kalman Shan DO Entered By: Kalman Shan on 09/27/2022 10:45:30 -------------------------------------------------------------------------------- Physical Exam Details Patient Name: Date of Service: [name] [name] 09/27/2022 10:15 A M Medical Record Number: [id number] Patient Account Number: [id number] Date of Birth/Sex: Treating RN: [birth year] (72 y.o. F) Primary Care Provider: Deland Pretty Other Clinician: Referring Provider: Treating Provider/Extender: Judie Grieve in Treatment: 47 Constitutional respirations regular, non-labored and within target range for patient.. Cardiovascular 2+ dorsalis pedis/posterior tibialis pulses. Psychiatric pleasant and cooperative. Notes Left lower extremity: Open wound with granulation tissue at the opening with a depth of 1.1 cm with no increased warmth, erythema or purulent drainage. No tenderness on palpation. Uncontrolled lymphedema above the knee. Minimal area of skin breakdown to the periwound Electronic Signature(s) Signed: 09/27/2022 1:42:51 PM By: Kalman Shan DO Entered By: Kalman Shan on 09/27/2022 10:46:22 -------------------------------------------------------------------------------- Physician Orders Details Patient Name: Date of Service: [name] [name] 09/27/2022 10:15 A M Medical Record Number: [id number] Patient Account Number: [id number] Date of Birth/Sex: Treating RN: 1950/09/30 (72 y.o. [name] Primary Care Provider: Deland Pretty Other Clinician: Referring Provider: Treating Provider/Extender: Judie Grieve in Treatment: 91 Verbal / Phone Orders:  No Diagnosis Coding ICD-10 Coding Code Description [phone] Non-pressure chronic ulcer of other part of left lower leg with fat layer exposed T79.8XXA Other early complications of trauma, initial encounter I87.312 Chronic venous hypertension (idiopathic) with ulcer of left lower extremity I89.0 Lymphedema, not elsewhere classified I48.0 Paroxysmal atrial fibrillation KERISHA, BLOXOM ([id number]) [id number] Page 4 of 10 Z79.01 Long term (current) use of anticoagulants Follow-up Appointments ppointment in 2 weeks. - w/ Dr. Heber [place] 215 08/11/2023 Room 8 Return A *****CANCEL 10/04/2022 APPT TIME.***** Other: - Continue T opical antibiotic ointment. use clobetasol antifungal at home around the wound. Anesthetic (In clinic) Topical Lidocaine 5% applied to wound bed Bathing/ Shower/ Hygiene May shower with protection but do not get wound dressing(s) wet. Protect dressing(s) with water repellant cover (for example, large plastic bag) or a cast cover and may then take shower. Edema Control - Lymphedema / SCD / Other Elevate legs to the level of the heart or above for 30 minutes daily and/or when sitting for 3-4 times a day throughout the day. Avoid standing for long periods of time. Exercise regularly Moisturize legs daily. - both legs every night before bed. Compression stocking or Garment 30-40 mm/Hg pressure to: - Juxtafit to left leg apply in the morning and remove at night. Wound Treatment Wound #1 - Lower Leg Wound Laterality: Left, Lateral Cleanser: Wound Cleanser (Home Health) 1 x Per Day/30 Days Discharge Instructions: Cleanse the wound with wound cleanser prior to applying a clean dressing using gauze sponges, not tissue or cotton balls. Peri-Wound Care: Ketoconazole Cream 2% 1 x Per Day/30 Days Discharge Instructions: Apply to periwound in clinic. you use clobetasol at home.  Prim Dressing: Endoform 2x2 in 1 x Per Day/30 Days ary Discharge  Instructions: Moisten with saline Prim Dressing: Compounding topical antibiotics 1 x Per Day/30 Days ary Discharge Instructions: apply antibiotic moisted gauze lightly packed into wound bed. Secondary Dressing: ABD Pad, 5x9 1 x Per Day/30 Days Discharge Instructions: Apply over primary dressing as directed. Secondary Dressing: Woven Gauze Sponge, Non-Sterile 4x4 in (Home Health) 1 x Per Day/30 Days Discharge Instructions: Apply over primary dressing as directed. Secured With: Elastic Bandage 4 inch (ACE bandage) (Generic) 1 x Per Day/30 Days Discharge Instructions: Secure with ACE bandage as directed. Secured With: [employer], 4.5x3.1 (in/yd) 1 x Per Day/30 Days Discharge Instructions: Secure with Kerlix as directed. Secured With: 78M Medipore H Soft Cloth Surgical T ape, 4 x 10 (in/yd) 1 x Per Day/30 Days Discharge Instructions: Secure with tape as directed. Electronic Signature(s) Signed: 09/27/2022 1:42:51 PM By: Kalman Shan DO Entered By: Kalman Shan on 09/27/2022 10:46:31 -------------------------------------------------------------------------------- Problem List Details Patient Name: Date of Service: [name] [name] 09/27/2022 10:15 A M Medical Record Number: [id number] Patient Account Number: [id number] Date of Birth/Sex: Treating RN: April 05, 1951 (72 y.o. [name] Primary Care Provider: Deland Pretty Other Clinician: Referring Provider: Treating Provider/Extender: Judie Grieve in Treatment: [address] Problems ICD-10 CURLIE, DELMUNDO ([id number]) [id number] Page 5 of 10 Encounter Code Description Active Date MDM Diagnosis [phone] Non-pressure chronic ulcer of other part of left lower leg with fat layer exposed3/31/2023 No Yes T79.8XXA Other early complications of trauma, initial encounter 11/19/2021 No Yes I87.312 Chronic venous hypertension (idiopathic) with ulcer of left lower extremity  04/28/2022 No Yes I89.0 Lymphedema, not elsewhere classified 04/28/2022 No Yes I48.0 Paroxysmal atrial fibrillation 11/19/2021 No Yes Z79.01 Long term (current) use of anticoagulants 11/19/2021 No Yes Inactive Problems Resolved Problems Electronic Signature(s) Signed: 09/27/2022 1:42:51 PM By: Kalman Shan DO Entered By: Kalman Shan on 09/27/2022 10:44:47 -------------------------------------------------------------------------------- Progress Note Details Patient Name: Date of Service: Christine Simmonds J. 09/27/2022 10:15 A M Medical Record Number: [id number] Patient Account Number: [id number] Date of Birth/Sex: Treating RN: 02/02/1951 (72 y.o. F) Primary Care Provider: Deland Pretty Other Clinician: Referring Provider: Treating Provider/Extender: Judie Grieve in Treatment: 67 Subjective Chief Complaint Information obtained from Patient 11/19/2021; Left lower extremity wound status post fall History of Present Illness (HPI) Admission 11/19/2021 Ms. Vernett Kucharek is a 72 year old female with a past medical history of paroxysmal A-fib on Eliquis, hypothyroidism, major depressive disorder, venous insufficiency and chronic diastolic heart failure that presents to the clinic for a 1 month history of nonhealing wound to the left lower extremity. She visited the ED on 10/18/2021 after a mechanical fall. She developed a hematoma that subsequently opened. She was hospitalized for 7 days and discharged on 10/25/2021. She has been on several different antibiotics For the past month. She states that most recently she was on Levaquin and linezolid for the past week. She states she completes her antibiotic course tomorrow. She has been using Dakin's wet-to-dry dressings to the wound bed. She denies signs of infection. 4/7; patient presents for follow-up. She has been using Dakin's wet-to-dry dressings. She did end up going to the ED on 4/1 because she had excess bleeding with  dressing change that she could not stop. She is on Eliquis for A-fib. In the ED they tied off a small artery. She has had no issues since discharge. She denies signs of infection. 4/14; this is a very difficult clinical situation. A patient  with underlying chronic venous insufficiency and lymphedema very significant lower extremity edema had a hematoma after a fall on her left upper lateral lower leg. She is on Eliquis for atrial fibrillation apparently with a history of a splenic infarct following with Dr. Jolyn Nap of cardiology. She has exhibited significant bleeding from the wound surface including ao Venous bleeder that required suturing short while ago. She has been using Dakin's wet-to-dry packing and over the surface of the wound. She saw Dr. Caryl Comes yesterday he is reluctant to consider stopping the Eliquis because of the prior history of presumed cardioembolism. Wants to communicate with Dr. Heber [place] when she returns. In a perfect world where she was not on Eliquis she requires a wound VAC with additional compression wraps but I understand the reluctance to do this because of the concerns of bleeding LILIJANA, STOCUM ([id number]) [id number] Page 6 of 10 4/28; the patient's wound actually looks better today using Dakin's wet-to-dry that she is changing twice a day she is wrapping this with Kerlix and Ace wrapping. She tells me she had 2 small bleeding areas which were part of the superficial wound that stopped this week with direct pressure. Other than that no major issues. In follow-up from discussion of last week Dr. 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 The bigger question from my point of view is the wound VAC issue. As far as she knows and her daughter-in-law verifies that she has not had  any bleeding from the deeper parts of the wound although the bleeding has been superficial including the one that sent her to the ER for stitches. She is concerned that a wound VAC would cause further bleeding and I cannot completely allay those concerns. 5/8; patient presents for follow-up. She has no issues or complaints today. She has been using Dakin's wet-to-dry dressings without issues. She denies signs of infection. 5/12; patient presents for follow-up. She continues to use Dakin's wet-to-dry dressings without issues. She denies signs of infection. She reports some issues with bleeding at times but this has improved. She denies signs of infection. 6/1; patient presents for follow-up. She was recently hospitalized for upper left leg thigh cellulitis. She was given IV cefepime and vancomycin and discharged on oral antibiotics. She has been using Dakin's wet-to-dry dressings to the left lower leg wound. She reports improvement in healing. She currently denies systemic signs of infection. 6/7; patient is using Dakin's wet-to-dry twice daily. In general this looks better than when I saw this a month or so ago however still considerable depth to the tunnel in her left leg. She is going for iron infusions ordered by her primary care doctor I believe 6/15; patient presents for follow-up. She has been using Dakin's wet-to-dry dressings. She has noted some scattered small areas that blister up and heal on her left lower leg. She has been using mupirocin ointment on them. Nothing open today. 6/23; patient's been using Dakin's wet-to-dry dressings to the tunneled wound and Hydrofera Blue to the opening. She has no issues or complaints today. 6/29; patient presents for follow-up. She has been using Dakin's wet-to-dry dressings to the tunneled wound and Hydrofera Blue to the opening. She states that [facility] is sticking to the wound bed. She denies signs of infection. 7/7; patient presents for  follow-up. She has been using Dakin's  wet-to-dry dressings to the tunneled wound and PolyMem silver to the opening without issues. 7/13; patient presents for follow-up. She has been using PolyMem silver to the opening and the rope to the tunnel. She has no issues or complaints today. She denies signs of infection. 7/20; patient presents for follow-up. We have been using PolyMem silver to the wound bed. She has no issues or complaints today. She is receiving her custom compression garments tomorrow. 7/27; patient presents for follow-up. She has been using PolyMem silver to the wound bed. She is having her custom compression garments adjusted as these are not staying on very easily. 8/30; patient presents for follow-up. We have been using PolyMem silver to the wound bed. She is still waiting on her custom compression garments to be ordered. She denies signs of infection. 8/11; patient presents for follow-up. The wound VAC has been started and patient has been using this for the past week. Patient has home health who changes the wound VAC. She developed irritation to the periwound. DuoDERM was not being used to the periwound despite being ordered. 8/15; patient presents for follow-up. She restarted the wound VAC and has had improvement to the periwound with the use of DuoDERM. 8/24; patient presents for follow-up. She has been using the wound VAC. She has developed a wound just superior to the original wound. Likely as a result from the wound VAC. She denies signs of infection. 8/29; patient presents for follow-up. We have been using collagen to the wound bed. She has held off on the wound VAC for the past week. 9/7; patient presents for follow-up. We have been using iodoform packing to the wound bed. She has mild tenderness to the periwound. She denies systemic signs of infection. 9/12; patient presents for follow-up. Patient has been using Dakin's wet-to-dry dressings. She has no issues or complaints  today. She denies signs of infection. 9/21; patient presents for follow-up. PuraPly #1 was placed in standard fashion at last clinic visit. She has no issues or complaints today. She denies signs of infection. 10/3; patient presents for follow-up. PuraPly #2 was placed in standard fashion at last clinic visit. She has no issues or complaints today. She denies signs of infection. 10/10; patient presents for follow-up. PuraPly #3 was placed in standard fashion at last clinic visit. She has no issues or complaints today. She denies signs of infection. 10/17; patient presents for follow-up. She has been using silver alginate to the tunneled wound. She has no issues or complaints today. 10/31; patient presents for follow-up. We have been doing Dakin's wet-to-dry dressings to the tunneled wound. The wound VAC has been ordered however patient has not received this. She currently denies signs of infection. She reports a lot of serosanguineous drainage. 11/14; patient presents for follow-up. She had a PCR culture done at last clinic visit that grew Pseudomonas aeruginosa. She obtained Keystone antibiotic spray yesterday. She also obtained her ultrasound of the left lower extremity to assess for abscess however the vascular lab ordered On erroneous test. This has been rectified. Patient was not charged for the test. DVT study showed no DVT T also reported to our nurse that they did not notice any abscess s. Christen Butter while doing the DVT rule out. 11/21; patient presents for follow-up. She has been using Keystone antibiotic with gauze packing. She has no issues or complaints today. 11/30; patient presents for follow-up. She has been using Keystone antibiotic with gauze packing. Depth is slightly larger today. She denies signs of infection. 12/5;  patient presents for follow-up. She has been using Keystone antibiotic spray with collagen. She has no issues or complaints today. She has been using an Ace wrap for  compression therapy. 12/12; patient presents for follow-up. She has been using Keystone antibiotic spray with collagen and Ace wrap for compression. She states that the Ace wrap falls down during the day. She is only able to use this at night. 12/19; patient presents for follow-up. She has been using Keystone antibiotic spray with collagen. She is unable to use an Ace wrap during the day because SAHMYA, ARAI ([id number]) [id number] Page 7 of 10 she states it falls down however she is able to use it at night without issues. She denies signs of infection. 12/26; patient presents for follow-up. She has been using Keystone antibiotic ointment with collagen. She uses an Ace wrap to help with compression at night. She has no issues or complaints today. 1/4; patient presents for follow-up. She is been using Keystone antibiotic with collagen. She uses the Ace wrap at night. She tries to use it in the daytime as well she has no issues or complaints today. 1/11; patient presents for follow-up. She has been using Keystone antibiotic ointment with collagen. She uses the Ace wrap at night T help with compression. o She has no issues or complaints today. 1/25; patient presents for follow-up. She has been using Keystone antibiotic ointment with collagen. Wound is stable. She uses ace wraps however during the day these do not stay in place. She is able to use it at night. She has no issues or complaints today. 1/30; patient presents for follow-up. She has been using Keystone antibiotic ointment with endoform. She continues to use ace wraps. She has no issues or complaints today. There is been improvement in wound healing. 2/6; patient presents for follow-up. She has been using Keystone antibiotic ointment with endoform. She has been using her juxta fit without issues. She does have some irritation to the periwound. Other than that she has no issues or complaints today. Patient  History Medical History Cardiovascular Patient has history of Congestive Heart Failure, Hypertension Hospitalization/Surgery History - Cellulitis left leg- 01/03/2022-01/07/2022. Medical A Surgical History Notes nd Constitutional Symptoms (General Health) Infarction of spleen Hematologic/Lymphatic Hypothyroidism Cardiovascular A-Fib Gastrointestinal Gastroesophageal reflux Genitourinary Chronic kidney disease Musculoskeletal Osteoarthritis of knee Psychiatric Anxiety Objective Constitutional respirations regular, non-labored and within target range for patient.. Vitals Time Taken: 10:06 AM, Height: 67 in, Weight: 340 lbs, BMI: 53.2, Temperature: 97.8 F, Pulse: 60 bpm, Respiratory Rate: 20 breaths/min, Blood Pressure: 165/75 mmHg. Cardiovascular 2+ dorsalis pedis/posterior tibialis pulses. Psychiatric pleasant and cooperative. General Notes: Left lower extremity: Open wound with granulation tissue at the opening with a depth of 1.1 cm with no increased warmth, erythema or purulent drainage. No tenderness on palpation. Uncontrolled lymphedema above the knee. Minimal area of skin breakdown to the periwound Integumentary (Hair, Skin) Wound #1 status is Open. Original cause of wound was Trauma. The date acquired was: 10/18/2021. The wound has been in treatment 44 weeks. The wound is located on the Left,Lateral Lower Leg. The wound measures 0.3cm length x 0.3cm width x 1.1cm depth; 0.071cm^2 area and 0.078cm^3 volume. There is Fat Layer (Subcutaneous Tissue) exposed. There is no tunneling or undermining noted. There is a medium amount of serosanguineous drainage noted. The wound margin is distinct with the outline attached to the wound base. There is large (67-100%) red granulation within the wound bed. There is no necrotic tissue within the wound bed. The  periwound skin appearance exhibited: Induration, Dry/Scaly, Hemosiderin Staining. The periwound skin appearance did not exhibit:  Callus, Crepitus, Excoriation, Rash, Scarring, Maceration, Atrophie Blanche, Cyanosis, Ecchymosis, Mottled, Pallor, Rubor, Erythema. Periwound temperature was noted as No Abnormality. The periwound has tenderness on palpation. Assessment Active Problems ICD-10 LESHAE, MCCLAY ([id number]) [id number] Page 8 of 10 Non-pressure chronic ulcer of other part of left lower leg with fat layer exposed Other early complications of trauma, initial encounter Chronic venous hypertension (idiopathic) with ulcer of left lower extremity Lymphedema, not elsewhere classified Paroxysmal atrial fibrillation Long term (current) use of anticoagulants Patient's wound has shown improvement in size since last clinic visit. I recommended continuing course with endoform and antibiotic ointment under juxta fit compression daily. She has some slight irritation to the periwound I recommended using her steroid cream for this. Follow-up in 2 weeks. Plan Follow-up Appointments: Return Appointment in 2 weeks. - w/ Dr. Mikey Bussing 215 08/11/2023 Room 8 *****CANCEL 10/04/2022 APPT TIME.***** Other: - Continue Topical antibiotic ointment. use clobetasol antifungal at home around the wound. Anesthetic: (In clinic) Topical Lidocaine 5% applied to wound bed Bathing/ Shower/ Hygiene: May shower with protection but do not get wound dressing(s) wet. Protect dressing(s) with water repellant cover (for example, large plastic bag) or a cast cover and may then take shower. Edema Control - Lymphedema / SCD / Other: Elevate legs to the level of the heart or above for 30 minutes daily and/or when sitting for 3-4 times a day throughout the day. Avoid standing for long periods of time. Exercise regularly Moisturize legs daily. - both legs every night before bed. Compression stocking or Garment 30-40 mm/Hg pressure to: - Juxtafit to left leg apply in the morning and remove at night. WOUND #1: - Lower Leg Wound  Laterality: Left, Lateral Cleanser: Wound Cleanser (Home Health) 1 x Per Day/30 Days Discharge Instructions: Cleanse the wound with wound cleanser prior to applying a clean dressing using gauze sponges, not tissue or cotton balls. Peri-Wound Care: Ketoconazole Cream 2% 1 x Per Day/30 Days Discharge Instructions: Apply to periwound in clinic. you use clobetasol at home. Prim Dressing: Endoform 2x2 in 1 x Per Day/30 Days ary Discharge Instructions: Moisten with saline Prim Dressing: Compounding topical antibiotics 1 x Per Day/30 Days ary Discharge Instructions: apply antibiotic moisted gauze lightly packed into wound bed. Secondary Dressing: ABD Pad, 5x9 1 x Per Day/30 Days Discharge Instructions: Apply over primary dressing as directed. Secondary Dressing: Woven Gauze Sponge, Non-Sterile 4x4 in (Home Health) 1 x Per Day/30 Days Discharge Instructions: Apply over primary dressing as directed. Secured With: Elastic Bandage 4 inch (ACE bandage) (Generic) 1 x Per Day/30 Days Discharge Instructions: Secure with ACE bandage as directed. Secured With: [employer], 4.5x3.1 (in/yd) 1 x Per Day/30 Days Discharge Instructions: Secure with Kerlix as directed. Secured With: 61M Medipore H Soft Cloth Surgical T ape, 4 x 10 (in/yd) 1 x Per Day/30 Days Discharge Instructions: Secure with tape as directed. 1. Endoform with antibiotic ointment 2. Juxta fit daily 3. Follow-up in 2 weeks Electronic Signature(s) Signed: 09/27/2022 1:42:51 PM By: Geralyn Corwin DO Entered By: Geralyn Corwin on 09/27/2022 10:47:23 -------------------------------------------------------------------------------- HxROS Details Patient Name: Date of Service: [name], [name] 09/27/2022 10:15 A M Medical Record Number: [id number] Patient Account Number: [id number] Date of Birth/Sex: Treating RN: July 04, 1951 (72 y.o. F) Primary Care Provider: [name] Other Clinician: Referring Provider: Treating  Provider/Extender: Annamary Rummage in Treatment: 26 Constitutional Symptoms (General Health) Medical History: Past Medical History  NotesKARINGTON, ZARAZUA ([id number]) [id number] Page 9 of 10 Infarction of spleen Hematologic/Lymphatic Medical History: Past Medical History Notes: Hypothyroidism Cardiovascular Medical History: Positive for: Congestive Heart Failure; Hypertension Past Medical History Notes: A-Fib Gastrointestinal Medical History: Past Medical History Notes: Gastroesophageal reflux Genitourinary Medical History: Past Medical History Notes: Chronic kidney disease Musculoskeletal Medical History: Past Medical History Notes: Osteoarthritis of knee Psychiatric Medical History: Past Medical History Notes: Anxiety Immunizations Pneumococcal Vaccine: Received Pneumococcal Vaccination: No Implantable Devices No devices added Hospitalization / Surgery History Type of Hospitalization/Surgery Cellulitis left leg- 01/03/2022-01/07/2022 Electronic Signature(s) Signed: 09/27/2022 1:42:51 PM By: Kalman Shan DO Entered By: Kalman Shan on 09/27/2022 10:45:36 -------------------------------------------------------------------------------- SuperBill Details Patient Name: Date of Service: [name]. 09/27/2022 Medical Record Number: [id number] Patient Account Number: [id number] Date of Birth/Sex: Treating RN: 1951-04-07 (72 y.o. F) Primary Care Provider: Deland Pretty Other Clinician: Referring Provider: Treating Provider/Extender: Judie Grieve in Treatment: [address] Diagnosis Coding ICD-10 Codes KHALIE, WINCE ([id number]) [id number] Page 10 of 10 Code Description [id number] Non-pressure chronic ulcer of other part of left lower leg with fat layer exposed T79.8XXA Other early complications of trauma, initial encounter I87.312 Chronic venous  hypertension (idiopathic) with ulcer of left lower extremity I89.0 Lymphedema, not elsewhere classified I48.0 Paroxysmal atrial fibrillation Z79.01 Long term (current) use of anticoagulants Physician Procedures : CPT4 Code Description Modifier [id number] - WC PHYS LEVEL 3 - EST PT ICD-10 Diagnosis Description L97.822 Non-pressure chronic ulcer of other part of left lower leg with fat layer exposed I87.312 Chronic venous hypertension (idiopathic) with ulcer  of left lower extremity T79.8XXA Other early complications of trauma, initial encounter I89.0 Lymphedema, not elsewhere classified Quantity: 1 Electronic Signature(s) Signed: 09/27/2022 1:42:51 PM By: Kalman Shan DO Entered By: Kalman Shan on 09/27/2022 10:49:39

## 2022-09-29 ENCOUNTER — Encounter (HOSPITAL_COMMUNITY): Payer: Self-pay | Admitting: *Deleted

## 2022-10-04 ENCOUNTER — Ambulatory Visit (HOSPITAL_BASED_OUTPATIENT_CLINIC_OR_DEPARTMENT_OTHER): Payer: PPO | Admitting: Internal Medicine

## 2022-10-10 ENCOUNTER — Other Ambulatory Visit: Payer: Self-pay | Admitting: Internal Medicine

## 2022-10-10 DIAGNOSIS — I480 Paroxysmal atrial fibrillation: Secondary | ICD-10-CM

## 2022-10-10 NOTE — Progress Notes (Signed)
JEWELIANNA, BRAUM ([id number]) [id number] Page 1 of 9 Visit Report for 09/01/2022 Arrival Information Details Patient Name: Date of Service: [name] 09/01/2022 10:30 A M Medical Record Number: [id number] Patient Account Number: [id number] Date of Birth/Sex: Treating RN: 1951/04/17 (72 y.o. F) Primary Care Zali Kamaka: Deland Pretty Other Clinician: Referring Avin Gibbons: Treating Gordan Grell/Extender: Judie Grieve in Treatment: 13 Visit Information History Since Last Visit Added or deleted any medications: No Patient Arrived: [name] Any new allergies or adverse reactions: No Arrival Time: 10:39 Had a fall or experienced change in No Accompanied By: self activities of daily living that may affect Transfer Assistance: None risk of falls: Patient Identification Verified: Yes Signs or symptoms of abuse/neglect since last visito No Secondary Verification Process Completed: Yes Hospitalized since last visit: No Patient Requires Transmission-Based Precautions: No Implantable device outside of the clinic excluding No Patient Has Alerts: Yes cellular tissue based products placed in the center Patient Alerts: Patient on Blood Thinner since last visit: Has Dressing in Place as Prescribed: Yes Pain Present Now: No Electronic Signature(s) Signed: 09/02/2022 11:33:40 AM By: Erenest Blank Entered By: Erenest Blank on 09/01/2022 10:39:38 -------------------------------------------------------------------------------- Clinic Level of Care Assessment Details Patient Name: Date of Service: [name] 09/01/2022 10:30 A M Medical Record Number: [id number] Patient Account Number: [id number] Date of Birth/Sex: Treating RN: 01-Oct-1950 (72 y.o. [name], [name] Primary Care Tanyah Debruyne: Deland Pretty Other Clinician: Referring Jewels Langone: Treating Libi Corso/Extender: Judie Grieve in Treatment: 40 Clinic Level  of Care Assessment Items TOOL 4 Quantity Score X- 1 0 Use when only an EandM is performed on FOLLOW-UP visit ASSESSMENTS - Nursing Assessment / Reassessment X- 1 10 Reassessment of Co-morbidities (includes updates in patient status) X- 1 5 Reassessment of Adherence to Treatment Plan ASSESSMENTS - Wound and Skin A ssessment / Reassessment X - Simple Wound Assessment / Reassessment - one wound 1 5 []$  - 0 Complex Wound Assessment / Reassessment - multiple wounds []$  - 0 Dermatologic / Skin Assessment (not related to wound area) ASSESSMENTS - Focused Assessment []$  - 0 Circumferential Edema Measurements - multi extremities []$  - 0 Nutritional Assessment / Counseling / Intervention NYASIAH, MAZEY ([id number]) [id number] Page 2 of 9 []$  - 0 Lower Extremity Assessment (monofilament, tuning fork, pulses) []$  - 0 Peripheral Arterial Disease Assessment (using hand held doppler) ASSESSMENTS - Ostomy and/or Continence Assessment and Care []$  - 0 Incontinence Assessment and Management []$  - 0 Ostomy Care Assessment and Management (repouching, etc.) PROCESS - Coordination of Care X - Simple Patient / Family Education for ongoing care 1 15 []$  - 0 Complex (extensive) Patient / Family Education for ongoing care X- 1 10 Staff obtains Programmer, systems, Records, T Results / Process Orders est []$  - 0 Staff telephones HHA, Nursing Homes / Clarify orders / etc []$  - 0 Routine Transfer to another Facility (non-emergent condition) []$  - 0 Routine Hospital Admission (non-emergent condition) []$  - 0 New Admissions / Biomedical engineer / Ordering NPWT Apligraf, etc. , []$  - 0 Emergency Hospital Admission (emergent condition) X- 1 10 Simple Discharge Coordination []$  - 0 Complex (extensive) Discharge Coordination PROCESS - Special Needs []$  - 0 Pediatric / Minor Patient Management []$  - 0 Isolation Patient Management []$  - 0 Hearing / Language / Visual special needs []$  -  0 Assessment of Community assistance (transportation, D/C planning, etc.) []$  - 0 Additional assistance / Altered mentation []$  - 0 Support Surface(s) Assessment (bed, cushion, seat, etc.) INTERVENTIONS - Wound Cleansing /  Measurement X - Simple Wound Cleansing - one wound 1 5 []$  - 0 Complex Wound Cleansing - multiple wounds X- 1 5 Wound Imaging (photographs - any number of wounds) []$  - 0 Wound Tracing (instead of photographs) X- 1 5 Simple Wound Measurement - one wound []$  - 0 Complex Wound Measurement - multiple wounds INTERVENTIONS - Wound Dressings []$  - 0 Small Wound Dressing one or multiple wounds X- 1 15 Medium Wound Dressing one or multiple wounds []$  - 0 Large Wound Dressing one or multiple wounds X- 1 5 Application of Medications - topical []$  - 0 Application of Medications - injection INTERVENTIONS - Miscellaneous []$  - 0 External ear exam []$  - 0 Specimen Collection (cultures, biopsies, blood, body fluids, etc.) []$  - 0 Specimen(s) / Culture(s) sent or taken to Lab for analysis []$  - 0 Patient Transfer (multiple staff / Civil Service fast streamer / Similar devices) []$  - 0 Simple Staple / Suture removal (25 or less) []$  - 0 Complex Staple / Suture removal (26 or more) []$  - 0 Hypo / Hyperglycemic Management (close monitor of Blood Glucose) WINNIE, CISNEY ([id number] Page 3 of 9 []$  - 0 Ankle / Brachial Index (ABI) - do not check if billed separately X- 1 5 Vital Signs Has the patient been seen at the hospital within the last three years: Yes Total Score: 95 Level Of Care: New/Established - Level 3 Electronic Signature(s) Signed: 10/10/2022 10:40:58 AM By: Rhae Hammock RN Entered By: Rhae Hammock on 09/01/2022 11:01:36 -------------------------------------------------------------------------------- Encounter Discharge Information Details Patient Name: Date of Service: [name] 09/01/2022 10:30 A M Medical Record  Number: [id number] Patient Account Number: [id number] Date of Birth/Sex: Treating RN: Oct 05, 1950 (72 y.o. [name], [name] Primary Care Carlton Buskey: Deland Pretty Other Clinician: Referring Hulbert Branscome: Treating Jolon Degante/Extender: Judie Grieve in Treatment: 34 Encounter Discharge Information Items Discharge Condition: Stable Ambulatory Status: Walker Discharge Destination: Home Transportation: Private Auto Accompanied By: self Schedule Follow-up Appointment: Yes Clinical Summary of Care: Patient Declined Electronic Signature(s) Signed: 10/10/2022 10:40:58 AM By: Rhae Hammock RN Entered By: Rhae Hammock on 09/01/2022 11:02:19 -------------------------------------------------------------------------------- Lower Extremity Assessment Details Patient Name: Date of Service: [name] 09/01/2022 10:30 A M Medical Record Number: [id number] Patient Account Number: [id number] Date of Birth/Sex: Treating RN: Dec 25, 1950 (72 y.o. F) Primary Care Dorianna Mckiver: Deland Pretty Other Clinician: Referring Azalee Weimer: Treating Verlinda Slotnick/Extender: Judie Grieve in Treatment: 40 Edema Assessment Assessed: [Left: No] [Right: No] Edema: [Left: Ye] [Right: s] Calf Left: Right: Point of Measurement: 31 cm From Medial Instep 48.5 cm Ankle Left: Right: Point of Measurement: 9 cm From Medial Instep 26.4 cm Electronic Signature(s) Signed: 09/02/2022 11:33:40 AM By: Leonette Most ([id number]) By: Erenest Blank [phone].pdf Page 4 of 9 Signed: 09/02/2022 11:33:40 AM Entered By: Erenest Blank on 09/01/2022 10:44:52 -------------------------------------------------------------------------------- Multi Wound Chart Details Patient Name: Date of Service: [name] 09/01/2022 10:30 A M Medical Record Number: [id number] Patient Account Number: [id number] Date of Birth/Sex: Treating RN: 1951-05-07  (72 y.o. F) Primary Care Angeleigh Chiasson: Deland Pretty Other Clinician: Referring Mavis Fichera: Treating Jamon Hayhurst/Extender: Judie Grieve in Treatment: 40 Vital Signs Height(in): 67 Pulse(bpm): 62 Weight(lbs): 340 Blood Pressure(mmHg): 164/78 Body Mass Index(BMI): 53.2 Temperature(F): 98.2 Respiratory Rate(breaths/min): 18 [1:Photos:] [N/A:N/A] Left, Lateral Lower Leg N/A N/A Wound Location: Trauma N/A N/A Wounding Event: Trauma, Other N/A N/A Primary Etiology: Congestive Heart Failure, N/A N/A Comorbid History: Hypertension 10/18/2021 N/A N/A Date Acquired: 40 N/A N/A Weeks  of Treatment: Open N/A N/A Wound Status: No N/A N/A Wound Recurrence: 1 N/A N/A Clustered Quantity: 0.3x0.3x0.3 N/A N/A Measurements L x W x D (cm) 0.071 N/A N/A A (cm) : rea 0.021 N/A N/A Volume (cm) : 100.00% N/A N/A % Reduction in A rea: 100.00% N/A N/A % Reduction in Volume: 12 Position 1 (o'clock): 1.8 Maximum Distance 1 (cm): Yes N/A N/A Tunneling: Full Thickness With Exposed Support N/A N/A Classification: Structures Medium N/A N/A Exudate Amount: Serosanguineous N/A N/A Exudate Type: red, brown N/A N/A Exudate Color: Distinct, outline attached N/A N/A Wound Margin: Large (67-100%) N/A N/A Granulation Amount: Red N/A N/A Granulation Quality: None Present (0%) N/A N/A Necrotic Amount: Fat Layer (Subcutaneous Tissue): Yes N/A N/A Exposed Structures: Fascia: No Tendon: No Muscle: No Joint: No Bone: No Large (67-100%) N/A N/A Epithelialization: Induration: Yes N/A N/A Periwound Skin Texture: Excoriation: No Callus: No Crepitus: No Rash: No Scarring: No Dry/Scaly: Yes N/A N/A Periwound Skin Moisture: Maceration: No Hemosiderin Staining: Yes N/A N/A Periwound Skin Color: BHAVINI, PIRRELLO ([id number]) [phone] Page 5 of 9 Atrophie Blanche: No Cyanosis: No Ecchymosis: No Erythema: No Mottled: No Pallor:  No Rubor: No No Abnormality N/A N/A Temperature: Yes N/A N/A Tenderness on Palpation: Treatment Notes Wound #1 (Lower Leg) Wound Laterality: Left, Lateral Cleanser Wound Cleanser Discharge Instruction: Cleanse the wound with wound cleanser prior to applying a clean dressing using gauze sponges, not tissue or cotton balls. Peri-Wound Care Ketoconazole Cream 2% Discharge Instruction: Apply to periwound in clinic. you use clobetasol at home. Topical keystone Discharge Instruction: Soak collagen in the [place] and pack into wound. Collage may be cut into thin strip. Primary Dressing Promogran Prisma Matrix, 4.34 (sq in) (silver collagen) Discharge Instruction: Moisten collagen with saline or hydrogel Plain packing strip 1/4 (in) Discharge Instruction: Lightly pack a small amount behind the collagen. leave a tail out of the wound to remove. Compounding topical antibiotics Discharge Instruction: apply antibiotic moisted gauze lightly packed into wound bed. Secondary Dressing ABD Pad, 5x9 Discharge Instruction: Apply over primary dressing as directed. Woven Gauze Sponge, Non-Sterile 4x4 in Discharge Instruction: Apply over primary dressing as directed. Secured With [employer], 4.5x3.1 (in/yd) Discharge Instruction: Secure with Kerlix as directed. 66M Medipore H Soft Cloth Surgical T ape, 4 x 10 (in/yd) Discharge Instruction: Secure with tape as directed. Compression Wrap ace wrap Discharge Instruction: apply to leg and knee. Compression Stockings Add-Ons Electronic Signature(s) Signed: 09/01/2022 12:04:27 PM By: Kalman Shan DO Entered By: Kalman Shan on 09/01/2022 11:05:03 -------------------------------------------------------------------------------- Multi-Disciplinary Care Plan Details Patient Name: Date of Service: Link Snuffer, Erick Blinks [name] 09/01/2022 10:30 A M Medical Record Number: [id number] Patient Account Number: [id number] Date of Birth/Sex: Treating  RN: May 05, 1951 (72 y.o. [name], [name] Primary Care Danyale Ridinger: Deland Pretty Other Clinician: Referring Brnadon Eoff: Treating Nachman Sundt/Extender: Chelynne, Kolin, Mart Piggs ([id number]) [id number] Page 6 of 9 Weeks in Treatment: 40 Active Inactive Abuse / Safety / Falls / Self Care Management Nursing Diagnoses: History of Falls Potential for falls Goals: Patient/caregiver will verbalize/demonstrate measure taken to improve self care Date Initiated: 11/19/2021 Date Inactivated: 04/05/2022 Target Resolution Date: 04/21/2022 Goal Status: Met Patient/caregiver will verbalize/demonstrate measures taken to prevent injury and/or falls Date Initiated: 11/19/2021 Target Resolution Date: 09/22/2022 Goal Status: Active Interventions: Provide education on basic hygiene Provide education on fall prevention Provide education on HBO safety Provide education on vaccinations Treatment Activities: Education provided on Basic Hygiene : 05/17/2022 Notes: Pain, Acute or Chronic Nursing Diagnoses: Pain, acute or chronic:  actual or potential Potential alteration in comfort, pain Goals: Patient will verbalize adequate pain control and receive pain control interventions during procedures as needed Date Initiated: 11/19/2021 Date Inactivated: 04/05/2022 Target Resolution Date: 04/22/2022 Goal Status: Met Patient/caregiver will verbalize comfort level met Date Initiated: 11/19/2021 Target Resolution Date: 09/15/2022 Goal Status: Active Interventions: Encourage patient to take pain medications as prescribed Provide education on pain management Reposition patient for comfort Treatment Activities: Administer pain control measures as ordered : 11/19/2021 Notes: Electronic Signature(s) Signed: 10/10/2022 10:40:58 AM By: Rhae Hammock RN Entered By: Rhae Hammock on 09/01/2022  10:57:02 -------------------------------------------------------------------------------- Pain Assessment Details Patient Name: Date of Service: [name] 09/01/2022 10:30 A M Medical Record Number: [id number] Patient Account Number: [id number] Date of Birth/Sex: Treating RN: 1951/07/19 (72 y.o. F) Primary Care Allen Egerton: Deland Pretty Other Clinician: Referring Jasani Dolney: Treating Kennette Cuthrell/Extender: Judie Grieve in Treatment: [address] Mart Piggs ([id number]) [id number] Page 7 of 9 Active Problems Location of Pain Severity and Description of Pain Patient Has Paino No Site Locations Pain Management and Medication Current Pain Management: Electronic Signature(s) Signed: 09/02/2022 11:33:40 AM By: Erenest Blank Entered By: Erenest Blank on 09/01/2022 10:40:22 -------------------------------------------------------------------------------- Patient/Caregiver Education Details Patient Name: Date of Service: [name] 1/11/2024andnbsp10:30 A M Medical Record Number: [id number] Patient Account Number: [id number] Date of Birth/Gender: Treating RN: 1950-11-19 (72 y.o. Benjaman Lobe Primary Care Physician: Deland Pretty Other Clinician: Referring Physician: Treating Physician/Extender: Judie Grieve in Treatment: 40 Education Assessment Education Provided To: Patient Education Topics Provided Wound/Skin Impairment: Methods: Explain/Verbal Responses: Reinforcements needed, State content correctly [employer]) Signed: 10/10/2022 10:40:58 AM By: Rhae Hammock RN Entered By: Rhae Hammock on 09/01/2022 10:57:18 Georgiana Spinner ([id number] Page 8 of 9 -------------------------------------------------------------------------------- Wound Assessment Details Patient Name: Date of Service: [name] 09/01/2022 10:30 A  M Medical Record Number: [id number] Patient Account Number: [id number] Date of Birth/Sex: Treating RN: 03-16-1951 (72 y.o. F) Primary Care Lavida Patch: Deland Pretty Other Clinician: Referring Arielle Eber: Treating Pansie Guggisberg/Extender: Judie Grieve in Treatment: 40 Wound Status Wound Number: 1 Primary Etiology: Trauma, Other Wound Location: Left, Lateral Lower Leg Wound Status: Open Wounding Event: Trauma Comorbid History: Congestive Heart Failure, Hypertension Date Acquired: 10/18/2021 Weeks Of Treatment: 40 Clustered Wound: No Photos Wound Measurements Length: (cm) Width: (cm) Depth: (cm) Clustered Quantity: Area: (cm) Volume: (cm) 0.3 % Reduction in Area: 100% 0.3 % Reduction in Volume: 100% 0.3 Epithelialization: Large (67-100%) 1 Tunneling: Yes 0.071 Position (o'clock): 12 0.021 Maximum Distance: (cm) 1.8 Undermining: No Wound Description Classification: Full Thickness With Exposed Suppor Wound Margin: Distinct, outline attached Exudate Amount: Medium Exudate Type: Serosanguineous Exudate Color: red, brown t Structures Foul Odor After Cleansing: No Slough/Fibrino No Wound Bed Granulation Amount: Large (67-100%) Exposed Structure Granulation Quality: Red Fascia Exposed: No Necrotic Amount: None Present (0%) Fat Layer (Subcutaneous Tissue) Exposed: Yes Tendon Exposed: No Muscle Exposed: No Joint Exposed: No Bone Exposed: No Periwound Skin Texture Texture Color No Abnormalities Noted: No No Abnormalities Noted: No Callus: No Atrophie Blanche: No Crepitus: No Cyanosis: No Excoriation: No Ecchymosis: No Induration: Yes Erythema: No Rash: No Hemosiderin Staining: Yes Scarring: No Mottled: No Pallor: No Moisture Rubor: No No Abnormalities Noted: No Dry / Scaly: Yes Temperature / Pain Maceration: No Temperature: No Abnormality Tenderness on Palpation: Yes Electronic Signature(s) CANYON, YANDO ([id number])  [id number] Page 9 of 9 Signed: 10/10/2022 10:40:58 AM By: Rhae Hammock RN Entered By: Rhae Hammock on 09/01/2022 10:58:07 --------------------------------------------------------------------------------  Vitals Details Patient Name: Date of Service: [name] 09/01/2022 10:30 A M Medical Record Number: [id number] Patient Account Number: [id number] Date of Birth/Sex: Treating RN: 03-26-1951 (72 y.o. F) Primary Care Ryna Beckstrom: Deland Pretty Other Clinician: Referring Patsie Mccardle: Treating Shital Crayton/Extender: Judie Grieve in Treatment: 40 Vital Signs Time Taken: 10:39 Temperature (F): 98.2 Height (in): 67 Pulse (bpm): 57 Weight (lbs): 340 Respiratory Rate (breaths/min): 18 Body Mass Index (BMI): 53.2 Blood Pressure (mmHg): 164/78 Reference Range: 80 - 120 mg / dl Electronic Signature(s) Signed: 09/02/2022 11:33:40 AM By: Erenest Blank Entered By: Erenest Blank on 09/01/2022 10:40:13

## 2022-10-10 NOTE — Telephone Encounter (Signed)
Eliquis 48m refill request received. Patient is 72years old, weight-145.6kg, Crea-0.99 on 01/07/22, Diagnosis-Afib, and last seen by Dr. KCaryl Comeson 04/26/22. Dose is appropriate based on dosing criteria. Will send in refill to requested pharmacy.

## 2022-10-11 ENCOUNTER — Encounter (HOSPITAL_BASED_OUTPATIENT_CLINIC_OR_DEPARTMENT_OTHER): Payer: PPO | Admitting: Internal Medicine

## 2022-10-11 DIAGNOSIS — T798XXA Other early complications of trauma, initial encounter: Secondary | ICD-10-CM | POA: Diagnosis not present

## 2022-10-11 DIAGNOSIS — I890 Lymphedema, not elsewhere classified: Secondary | ICD-10-CM

## 2022-10-11 DIAGNOSIS — I87312 Chronic venous hypertension (idiopathic) with ulcer of left lower extremity: Secondary | ICD-10-CM

## 2022-10-11 DIAGNOSIS — L97822 Non-pressure chronic ulcer of other part of left lower leg with fat layer exposed: Secondary | ICD-10-CM

## 2022-10-12 NOTE — Progress Notes (Signed)
Christine, [name] ([id number]) [id number] Page 1 of 9 Visit Report for 10/11/2022 Chief Complaint Document Details Patient Name: Date of Service: [name] 10/11/2022 2:15 PM Medical Record Number: [id number] Patient Account Number: [id number] Date of Birth/Sex: Treating RN: 10/23/1950 (72 y.o. F) Primary Care Provider: Deland Pretty Other Clinician: Referring Provider: Treating Provider/Extender: Judie Grieve in [id number] Information Obtained from: Patient Chief Complaint 11/19/2021; Left lower extremity wound status post fall Electronic Signature(s) Signed: 10/11/2022 3:41:53 PM By: Kalman Shan DO Entered By: Kalman Shan on 10/11/2022 15:19:33 -------------------------------------------------------------------------------- HPI Details Patient Name: Date of Service: [name]. 10/11/2022 2:15 PM Medical Record Number: [id number] Patient Account Number: [id number] Date of Birth/Sex: Treating RN: April 29, 1951 (72 y.o. F) Primary Care Provider: Deland Pretty Other Clinician: Referring Provider: Treating Provider/Extender: Judie Grieve in Treatment: 22 History of Present Illness HPI Description: Admission 11/19/2021 [name] is a 72 year old female with a past medical history of paroxysmal A-fib on Eliquis, hypothyroidism, major depressive disorder, venous insufficiency and chronic diastolic heart failure that presents to the clinic for a 1 month history of nonhealing wound to the left lower extremity. She visited the ED on 10/18/2021 after a mechanical fall. She developed a hematoma that subsequently opened. She was hospitalized for 7 days and discharged on 10/25/2021. She has been on several different antibiotics For the past month. She states that most recently she was on Levaquin and linezolid for the past week. She states she completes her antibiotic course tomorrow.  She has been using Dakin's wet-to-dry dressings to the wound bed. She denies signs of infection. 4/7; patient presents for follow-up. She has been using Dakin's wet-to-dry dressings. She did end up going to the ED on 4/1 because she had excess bleeding with dressing change that she could not stop. She is on Eliquis for A-fib. In the ED they tied off a small artery. She has had no issues since discharge. She denies signs of infection. 4/14; this is a very difficult clinical situation. A patient with underlying chronic venous insufficiency and lymphedema very significant lower extremity edema had a hematoma after a fall on her left upper lateral lower leg. She is on Eliquis for atrial fibrillation apparently with a history of a splenic infarct following with Dr. Jolyn Nap of cardiology. She has exhibited significant bleeding from the wound surface including ao Venous bleeder that required suturing short while ago. She has been using Dakin's wet-to-dry packing and over the surface of the wound. She saw Dr. Caryl Comes yesterday he is reluctant to consider stopping the Eliquis because of the prior history of presumed cardioembolism. Wants to communicate with Dr. Heber Blaine when she returns. In a perfect world where she was not on Eliquis she requires a wound VAC with additional compression wraps but I understand the reluctance to do this because of the concerns of bleeding 4/28; the patient's wound actually looks better today using Dakin's wet-to-dry that she is changing twice a day she is wrapping this with Kerlix and Ace wrapping. She tells me she had 2 small bleeding areas which were part of the superficial wound that stopped this week with direct pressure. Other than that no major issues. In follow-up from discussion of last week Dr. 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 The bigger  question from my point of view is the wound VAC issue. As far as she knows and her daughter-in-law verifies that she has not had any bleeding from the deeper parts of the wound although the bleeding has been superficial including the one that sent her to the ER for stitches. She is concerned that a wound VAC would cause further bleeding and I cannot completely allay those concerns. Christine, [name] ([id number]) [id number] Page 2 of 9 5/8; patient presents for follow-up. She has no issues or complaints today. She has been using Dakin's wet-to-dry dressings without issues. She denies signs of infection. 5/12; patient presents for follow-up. She continues to use Dakin's wet-to-dry dressings without issues. She denies signs of infection. She reports some issues with bleeding at times but this has improved. She denies signs of infection. 6/1; patient presents for follow-up. She was recently hospitalized for upper left leg thigh cellulitis. She was given IV cefepime and vancomycin and discharged on oral antibiotics. She has been using Dakin's wet-to-dry dressings to the left lower leg wound. She reports improvement in healing. She currently denies systemic signs of infection. 6/7; patient is using Dakin's wet-to-dry twice daily. In general this looks better than when I saw this a month or so ago however still considerable depth to the tunnel in her left leg. She is going for iron infusions ordered by her primary care doctor I believe 6/15; patient presents for follow-up. She has been using Dakin's wet-to-dry dressings. She has noted some scattered small areas that blister up and heal on her left lower leg. She has been using mupirocin ointment on them. Nothing open today. 6/23; patient's been using Dakin's wet-to-dry dressings to the tunneled wound and Hydrofera Blue to the opening. She has no issues or complaints today. 6/29;  patient presents for follow-up. She has been using Dakin's wet-to-dry dressings to the tunneled wound and Hydrofera Blue to the opening. She states that [facility] is sticking to the wound bed. She denies signs of infection. 7/7; patient presents for follow-up. She has been using Dakin's wet-to-dry dressings to the tunneled wound and PolyMem silver to the opening without issues. 7/13; patient presents for follow-up. She has been using PolyMem silver to the opening and the rope to the tunnel. She has no issues or complaints today. She denies signs of infection. 7/20; patient presents for follow-up. We have been using PolyMem silver to the wound bed. She has no issues or complaints today. She is receiving her custom compression garments tomorrow. 7/27; patient presents for follow-up. She has been using PolyMem silver to the wound bed. She is having her custom compression garments adjusted as these are not staying on very easily. 8/30; patient presents for follow-up. We have been using PolyMem silver to the wound bed. She is still waiting on her custom compression garments to be ordered. She denies signs of infection. 8/11; patient presents for follow-up. The wound VAC has been started and patient has been using this for the past week. Patient has home health who changes the wound VAC. She developed irritation to the periwound. DuoDERM was not being used to the periwound despite being ordered. 8/15; patient presents for follow-up. She restarted the wound VAC and has had improvement to the periwound with the use of DuoDERM. 8/24; patient presents for follow-up. She has been using the wound VAC. She has developed a wound just superior to the original wound. Likely as a result from the wound VAC. She denies signs of infection. 8/29; patient presents  for follow-up. We have been using collagen to the wound bed. She has held off on the wound VAC for the past week. 9/7; patient presents for follow-up. We  have been using iodoform packing to the wound bed. She has mild tenderness to the periwound. She denies systemic signs of infection. 9/12; patient presents for follow-up. Patient has been using Dakin's wet-to-dry dressings. She has no issues or complaints today. She denies signs of infection. 9/21; patient presents for follow-up. PuraPly #1 was placed in standard fashion at last clinic visit. She has no issues or complaints today. She denies signs of infection. 10/3; patient presents for follow-up. PuraPly #2 was placed in standard fashion at last clinic visit. She has no issues or complaints today. She denies signs of infection. 10/10; patient presents for follow-up. PuraPly #3 was placed in standard fashion at last clinic visit. She has no issues or complaints today. She denies signs of infection. 10/17; patient presents for follow-up. She has been using silver alginate to the tunneled wound. She has no issues or complaints today. 10/31; patient presents for follow-up. We have been doing Dakin's wet-to-dry dressings to the tunneled wound. The wound VAC has been ordered however patient has not received this. She currently denies signs of infection. She reports a lot of serosanguineous drainage. 11/14; patient presents for follow-up. She had a PCR culture done at last clinic visit that grew Pseudomonas aeruginosa. She obtained Keystone antibiotic spray yesterday. She also obtained her ultrasound of the left lower extremity to assess for abscess however the vascular lab ordered On erroneous test. This has been rectified. Patient was not charged for the test. DVT study showed no DVT T also reported to our nurse that they did not notice any abscess s. Aletha Halim while doing the DVT rule out. 11/21; patient presents for follow-up. She has been using Keystone antibiotic with gauze packing. She has no issues or complaints today. 11/30; patient presents for follow-up. She has been using Keystone antibiotic with  gauze packing. Depth is slightly larger today. She denies signs of infection. 12/5; patient presents for follow-up. She has been using Keystone antibiotic spray with collagen. She has no issues or complaints today. She has been using an Ace wrap for compression therapy. 12/12; patient presents for follow-up. She has been using Keystone antibiotic spray with collagen and Ace wrap for compression. She states that the Ace wrap falls down during the day. She is only able to use this at night. 12/19; patient presents for follow-up. She has been using Keystone antibiotic spray with collagen. She is unable to use an Ace wrap during the day because she states it falls down however she is able to use it at night without issues. She denies signs of infection. 12/26; patient presents for follow-up. She has been using Keystone antibiotic ointment with collagen. She uses an Ace wrap to help with compression at night. She has no issues or complaints today. 1/4; patient presents for follow-up. She is been using Keystone antibiotic with collagen. She uses the Ace wrap at night. She tries to use it in the daytime as well she has no issues or complaints today. 1/11; patient presents for follow-up. She has been using Keystone antibiotic ointment with collagen. She uses the Ace wrap at night T help with compression. o She has no issues or complaints today. 1/25; patient presents for follow-up. She has been using Keystone antibiotic ointment with collagen. Wound is stable. She uses ace wraps however during the TAIGEN, BRANYAN ([id number])  [phone].pdf Page 3 of 9 day these do not stay in place. She is able to use it at night. She has no issues or complaints today. 1/30; patient presents for follow-up. She has been using Keystone antibiotic ointment with endoform. She continues to use ace wraps. She has no issues or complaints today. There is been improvement in wound healing. 2/6; patient  presents for follow-up. She has been using Keystone antibiotic ointment with endoform. She has been using her juxta fit without issues. She does have some irritation to the periwound. Other than that she has no issues or complaints today. 2/20; patient presents for follow-up. Patient has been using endoform with antibiotic ointment. She is also been using her juxta fit. Her wound is healed. Electronic Signature(s) Signed: 10/11/2022 3:41:53 PM By: Kalman Shan DO Entered By: Kalman Shan on 10/11/2022 15:23:37 -------------------------------------------------------------------------------- Physical Exam Details Patient Name: Date of Service: [name] 10/11/2022 2:15 PM Medical Record Number: [id number] Patient Account Number: [id number] Date of Birth/Sex: Treating RN: 22-Feb-1951 (72 y.o. F) Primary Care Provider: Deland Pretty Other Clinician: Referring Provider: Treating Provider/Extender: Judie Grieve in Treatment: 61 Constitutional respirations regular, non-labored and within target range for patient.. Cardiovascular 2+ dorsalis pedis/posterior tibialis pulses. Psychiatric pleasant and cooperative. Notes Left lower extremity: Epithelization to the previous wound site. Lymphedema. No signs of surrounding infection. Electronic Signature(s) Signed: 10/11/2022 3:41:53 PM By: Kalman Shan DO Entered By: Kalman Shan on 10/11/2022 15:24:11 -------------------------------------------------------------------------------- Physician Orders Details Patient Name: Date of Service: [name] 10/11/2022 2:15 PM Medical Record Number: [id number] Patient Account Number: [id number] Date of Birth/Sex: Treating RN: [birth year] (72 y.o. [name] Primary Care Provider: Deland Pretty Other Clinician: Referring Provider: Treating Provider/Extender: Judie Grieve in Treatment: 61 Verbal / Phone Orders:  No Diagnosis Coding Discharge From [facility] Services Discharge from [place] - Call if any future wound care needs. Edema Control - Lymphedema / SCD / Other Elevate legs to the level of the heart or above for 30 minutes daily and/or when sitting for 3-4 times a day throughout the day. Avoid standing for long periods of time. Patient to wear own compression stockings every day. Exercise regularly Moisturize legs daily. LANIKA, PALLEY ([id number]) [id number] Page 4 of 9 Compression stocking or Garment 30-40 mm/Hg pressure to: Electronic Signature(s) Signed: 10/11/2022 3:41:53 PM By: Kalman Shan DO Entered By: Kalman Shan on 10/11/2022 15:24:18 -------------------------------------------------------------------------------- Problem List Details Patient Name: Date of Service: [name] 10/11/2022 2:15 PM Medical Record Number: [id number] Patient Account Number: [id number] Date of Birth/Sex: Treating RN: 08/12/1951 (72 y.o. 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/31/2023 No Yes T79.8XXA Other early complications of trauma, initial encounter 11/19/2021 No Yes I87.312 Chronic venous hypertension (idiopathic) with ulcer of left lower extremity 04/28/2022 No Yes I89.0 Lymphedema, not elsewhere classified 04/28/2022 No Yes I48.0 Paroxysmal atrial fibrillation 11/19/2021 No Yes Z79.01 Long term (current) use of anticoagulants 11/19/2021 No Yes Inactive Problems Resolved Problems Electronic Signature(s) Signed: 10/11/2022 3:41:53 PM By: Kalman Shan DO Entered By: Kalman Shan on 10/11/2022  15:19:23 -------------------------------------------------------------------------------- Progress Note Details Patient Name: Date of Service: [name]. 10/11/2022 2:15 PM Medical Record Number: [id number] Patient Account Number: [id number] Date of Birth/Sex: Treating RN:  [birth year] (72 y.o. Zandalee Gehr, Mart Piggs ([id number]) [id number] Page 5 of 9 Primary Care Provider: Deland Pretty Other Clinician: Referring Provider: Treating Provider/Extender: Judie Grieve in [id number] Subjective Chief Complaint Information obtained from Patient 11/19/2021; Left lower extremity wound status post fall History of Present Illness (HPI) Admission 11/19/2021 Ms. Shirell Pelz is a 72 year old female with a past medical history of paroxysmal A-fib on Eliquis, hypothyroidism, major depressive disorder, venous insufficiency and chronic diastolic heart failure that presents to the clinic for a 1 month history of nonhealing wound to the left lower extremity. She visited the ED on 10/18/2021 after a mechanical fall. She developed a hematoma that subsequently opened. She was hospitalized for 7 days and discharged on 10/25/2021. She has been on several different antibiotics For the past month. She states that most recently she was on Levaquin and linezolid for the past week. She states she completes her antibiotic course tomorrow. She has been using Dakin's wet-to-dry dressings to the wound bed. She denies signs of infection. 4/7; patient presents for follow-up. She has been using Dakin's wet-to-dry dressings. She did end up going to the ED on 4/1 because she had excess bleeding with dressing change that she could not stop. She is on Eliquis for A-fib. In the ED they tied off a small artery. She has had no issues since discharge. She denies signs of infection. 4/14; this is a very difficult clinical situation. A patient with underlying chronic venous  insufficiency and lymphedema very significant lower extremity edema had a hematoma after a fall on her left upper lateral lower leg. She is on Eliquis for atrial fibrillation apparently with a history of a splenic infarct following with Dr. Jolyn Nap of cardiology. She has exhibited significant bleeding from the wound surface including ao Venous bleeder that required suturing short while ago. She has been using Dakin's wet-to-dry packing and over the surface of the wound. She saw Dr. Caryl Comes yesterday he is reluctant to consider stopping the Eliquis because of the prior history of presumed cardioembolism. Wants to communicate with Dr. Heber Loop when she returns. In a perfect world where she was not on Eliquis she requires a wound VAC with additional compression wraps but I understand the reluctance to do this because of the concerns of bleeding 4/28; the patient's wound actually looks better today using Dakin's wet-to-dry that she is changing twice a day she is wrapping this with Kerlix and Ace wrapping. She tells me she had 2 small bleeding areas which were part of the superficial wound that stopped this week with direct pressure. Other than that no major issues. In follow-up from discussion of last week Dr. 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 The bigger question from my point of view is the wound VAC issue. As far as she knows and her daughter-in-law verifies that she has not had any bleeding from the deeper parts of the wound although the bleeding has been superficial including the one that sent her to the ER for stitches. She is concerned that a wound VAC would cause further bleeding and I cannot completely allay those concerns. 5/8; patient presents for follow-up. She has no issues or complaints today. She has been using  [place]akin's wet-to-dry dressings without issues. She denies signs of infection. 5/12; patient presents for follow-up. She continues to  use Dakin's wet-to-dry dressings without issues. She denies signs of infection. She reports some issues with bleeding at times but this has improved. She denies signs of infection. 6/1; patient presents for follow-up. She was recently hospitalized for upper left leg thigh cellulitis. She was given IV cefepime and vancomycin and discharged on oral antibiotics. She has been using Dakin's wet-to-dry dressings to the left lower leg wound. She reports improvement in healing. She currently denies systemic signs of infection. 6/7; patient is using Dakin's wet-to-dry twice daily. In general this looks better than when I saw this a month or so ago however still considerable depth to the tunnel in her left leg. She is going for iron infusions ordered by her primary care doctor I believe 6/15; patient presents for follow-up. She has been using Dakin's wet-to-dry dressings. She has noted some scattered small areas that blister up and heal on her left lower leg. She has been using mupirocin ointment on them. Nothing open today. 6/23; patient's been using Dakin's wet-to-dry dressings to the tunneled wound and Hydrofera Blue to the opening. She has no issues or complaints today. 6/29; patient presents for follow-up. She has been using Dakin's wet-to-dry dressings to the tunneled wound and Hydrofera Blue to the opening. She states that [facility] is sticking to the wound bed. She denies signs of infection. 7/7; patient presents for follow-up. She has been using Dakin's wet-to-dry dressings to the tunneled wound and PolyMem silver to the opening without issues. 7/13; patient presents for follow-up. She has been using PolyMem silver to the opening and the rope to the tunnel. She has no issues or complaints today. She denies signs of infection. 7/20; patient presents for follow-up. We  have been using PolyMem silver to the wound bed. She has no issues or complaints today. She is receiving her custom compression garments tomorrow. 7/27; patient presents for follow-up. She has been using PolyMem silver to the wound bed. She is having her custom compression garments adjusted as these are not staying on very easily. 8/30; patient presents for follow-up. We have been using PolyMem silver to the wound bed. She is still waiting on her custom compression garments to be ordered. She denies signs of infection. 8/11; patient presents for follow-up. The wound VAC has been started and patient has been using this for the past week. Patient has home health who changes the wound VAC. She developed irritation to the periwound. DuoDERM was not being used to the periwound despite being ordered. 8/15; patient presents for follow-up. She restarted the wound VAC and has had improvement to the periwound with the use of DuoDERM. 8/24; patient presents for follow-up. She has been using the wound VAC. She has developed a wound just superior to the original wound. Likely as a result from the wound VAC. She denies signs of infection. 8/29; patient presents for follow-up. We have been using collagen to the wound bed. She has held off on the wound VAC for the past week. 9/7; patient presents for follow-up. We have been using iodoform packing to the wound bed. She has mild tenderness to the periwound. She denies systemic signs of infection. MILTON, DORION ([id number]) [id number] Page 6 of 9 9/12; patient presents for follow-up. Patient has been using Dakin's wet-to-dry dressings. She has no issues or complaints today. She denies signs of infection. 9/21; patient presents for follow-up. PuraPly #1 was placed in standard fashion at last clinic visit. She has no issues or complaints today. She denies signs of infection.  10/3; patient presents for follow-up. PuraPly #2 was placed  in standard fashion at last clinic visit. She has no issues or complaints today. She denies signs of infection. 10/10; patient presents for follow-up. PuraPly #3 was placed in standard fashion at last clinic visit. She has no issues or complaints today. She denies signs of infection. 10/17; patient presents for follow-up. She has been using silver alginate to the tunneled wound. She has no issues or complaints today. 10/31; patient presents for follow-up. We have been doing Dakin's wet-to-dry dressings to the tunneled wound. The wound VAC has been ordered however patient has not received this. She currently denies signs of infection. She reports a lot of serosanguineous drainage. 11/14; patient presents for follow-up. She had a PCR culture done at last clinic visit that grew Pseudomonas aeruginosa. She obtained Keystone antibiotic spray yesterday. She also obtained her ultrasound of the left lower extremity to assess for abscess however the vascular lab ordered On erroneous test. This has been rectified. Patient was not charged for the test. DVT study showed no DVT T also reported to our nurse that they did not notice any abscess s. Aletha Halim while doing the DVT rule out. 11/21; patient presents for follow-up. She has been using Keystone antibiotic with gauze packing. She has no issues or complaints today. 11/30; patient presents for follow-up. She has been using Keystone antibiotic with gauze packing. Depth is slightly larger today. She denies signs of infection. 12/5; patient presents for follow-up. She has been using Keystone antibiotic spray with collagen. She has no issues or complaints today. She has been using an Ace wrap for compression therapy. 12/12; patient presents for follow-up. She has been using Keystone antibiotic spray with collagen and Ace wrap for compression. She states that the Ace wrap falls down during the day. She is only able to use this at night. 12/19; patient presents for  follow-up. She has been using Keystone antibiotic spray with collagen. She is unable to use an Ace wrap during the day because she states it falls down however she is able to use it at night without issues. She denies signs of infection. 12/26; patient presents for follow-up. She has been using Keystone antibiotic ointment with collagen. She uses an Ace wrap to help with compression at night. She has no issues or complaints today. 1/4; patient presents for follow-up. She is been using Keystone antibiotic with collagen. She uses the Ace wrap at night. She tries to use it in the daytime as well she has no issues or complaints today. 1/11; patient presents for follow-up. She has been using Keystone antibiotic ointment with collagen. She uses the Ace wrap at night T help with compression. o She has no issues or complaints today. 1/25; patient presents for follow-up. She has been using Keystone antibiotic ointment with collagen. Wound is stable. She uses ace wraps however during the day these do not stay in place. She is able to use it at night. She has no issues or complaints today. 1/30; patient presents for follow-up. She has been using Keystone antibiotic ointment with endoform. She continues to use ace wraps. She has no issues or complaints today. There is been improvement in wound healing. 2/6; patient presents for follow-up. She has been using Keystone antibiotic ointment with endoform. She has been using her juxta fit without issues. She does have some irritation to the periwound. Other than that she has no issues or complaints today. 2/20; patient presents for follow-up. Patient has been using endoform  with antibiotic ointment. She is also been using her juxta fit. Her wound is healed. Patient History Medical History Cardiovascular Patient has history of Congestive Heart Failure, Hypertension Hospitalization/Surgery History - Cellulitis left leg- 01/03/2022-01/07/2022. Medical A Surgical  History Notes nd Constitutional Symptoms (General Health) Infarction of spleen Hematologic/Lymphatic Hypothyroidism Cardiovascular A-Fib Gastrointestinal Gastroesophageal reflux Genitourinary Chronic kidney disease Musculoskeletal Osteoarthritis of knee Psychiatric Anxiety Objective SOLA, FARQUHAR ([id number] Page 7 of 9 Constitutional respirations regular, non-labored and within target range for patient.. Vitals Time Taken: 2:40 PM, Height: 67 in, Weight: 340 lbs, BMI: 53.2, Temperature: 98.3 F, Pulse: 60 bpm, Respiratory Rate: 20 breaths/min, Blood Pressure: 165/87 mmHg. Cardiovascular 2+ dorsalis pedis/posterior tibialis pulses. Psychiatric pleasant and cooperative. General Notes: Left lower extremity: Epithelization to the previous wound site. Lymphedema. No signs of surrounding infection. Integumentary (Hair, Skin) Wound #1 status is Healed - Epithelialized. Original cause of wound was Trauma. The date acquired was: 10/18/2021. The wound has been in treatment 46 weeks. The wound is located on the Left,Lateral Lower Leg. The wound measures 0cm length x 0cm width x 0cm depth; 0cm^2 area and 0cm^3 volume. There is no tunneling or undermining noted. There is a none present amount of drainage noted. The wound margin is distinct with the outline attached to the wound base. There is no granulation within the wound bed. There is no necrotic tissue within the wound bed. The periwound skin appearance did not exhibit: Callus, Crepitus, Excoriation, Induration, Rash, Scarring, Dry/Scaly, Maceration, Atrophie Blanche, Cyanosis, Ecchymosis, Hemosiderin Staining, Mottled, Pallor, Rubor, Erythema. Periwound temperature was noted as No Abnormality. The periwound has tenderness on palpation. Assessment Active Problems ICD-10 Non-pressure chronic ulcer of other part of left lower leg with fat layer exposed Other early complications of trauma,  initial encounter Chronic venous hypertension (idiopathic) with ulcer of left lower extremity Lymphedema, not elsewhere classified Paroxysmal atrial fibrillation Long term (current) use of anticoagulants Patient has done well with endoform and antibiotic ointment. Her wound is healed. I recommended she wear her juxta fit daily. Follow-up as needed. Plan Discharge From [facility]: Discharge from [facility] - Call if any future wound care needs. Edema Control - Lymphedema / SCD / Other: Elevate legs to the level of the heart or above for 30 minutes daily and/or when sitting for 3-4 times a day throughout the day. Avoid standing for long periods of time. Patient to wear own compression stockings every day. Exercise regularly Moisturize legs daily. Compression stocking or Garment 30-40 mm/Hg pressure to: 1. Discharge from clinic due to closed wound 2. Follow-up as needed 3. Juxtafit daily Electronic Signature(s) Signed: 10/11/2022 3:41:53 PM By: Kalman Shan DO Entered By: Kalman Shan on 10/11/2022 15:25:08 -------------------------------------------------------------------------------- HxROS Details Patient Name: Date of Service: [name] 10/11/2022 2:15 PM Medical Record Number: [id number] Patient Account Number: [id number] Date of Birth/Sex: Treating RN: [birth year] (72 y.o. F) Primary Care Provider: Deland Pretty Other Clinician: DARIELLA, COLUMBO ([id number]) [id number] Page 8 of 9 Referring Provider: Treating Provider/Extender: Judie Grieve in Treatment: 46 Constitutional Symptoms (General Health) Medical History: Past Medical History Notes: Infarction of spleen Hematologic/Lymphatic Medical History: Past Medical History Notes: Hypothyroidism Cardiovascular Medical History: Positive for: Congestive Heart Failure; Hypertension Past Medical History Notes: A-Fib Gastrointestinal Medical  History: Past Medical History Notes: Gastroesophageal reflux Genitourinary Medical History: Past Medical History Notes: Chronic kidney disease Musculoskeletal Medical History: Past Medical History Notes: Osteoarthritis of knee Psychiatric Medical History: Past Medical History Notes: Anxiety Immunizations Pneumococcal Vaccine: Received  Pneumococcal Vaccination: No Implantable Devices No devices added Hospitalization / Surgery History Type of Hospitalization/Surgery Cellulitis left leg- 01/03/2022-01/07/2022 Electronic Signature(s) Signed: 10/11/2022 3:41:53 PM By: Kalman Shan DO Entered By: Kalman Shan on 10/11/2022 15:23:42 -------------------------------------------------------------------------------- SuperBill Details Patient Name: Date of Service: [name] 10/11/2022 Medical Record Number: [id number] Patient Account Number: [id number] Date of Birth/Sex: Treating RN: 1951/06/12 (72 y.o. [name] Primary Care Provider: Deland Pretty Other Clinician: Referring Provider: Treating Provider/Extender: Alyrica, Semon, Mart Piggs ([id number]) [id number] Page 9 of 9 Weeks in Treatment: 46 Diagnosis Coding ICD-10 Codes Code Description [phone] Non-pressure chronic ulcer of other part of left lower leg with fat layer exposed T79.8XXA Other early complications of trauma, initial encounter I87.312 Chronic venous hypertension (idiopathic) with ulcer of left lower extremity I89.0 Lymphedema, not elsewhere classified I48.0 Paroxysmal atrial fibrillation Z79.01 Long term (current) use of anticoagulants Facility Procedures : CPT4 Code: [id number] Description: 99213 - WOUND CARE VISIT-LEV 3 EST PT Modifier: Quantity: 1 Physician Procedures : CPT4 Code Description Modifier [id number] - WC PHYS LEVEL 3 - EST PT ICD-10 Diagnosis Description L97.822 Non-pressure chronic ulcer of other part of left lower  leg with fat layer exposed T79.8XXA Other early complications of trauma, initial  encounter I89.0 Lymphedema, not elsewhere classified I87.312 Chronic venous hypertension (idiopathic) with ulcer of left lower extremity Quantity: 1 Electronic Signature(s) Signed: 10/11/2022 3:41:53 PM By: Kalman Shan DO Entered By: Kalman Shan on 10/11/2022 15:25:25

## 2022-10-12 NOTE — Progress Notes (Signed)
[name], [name] ([id number]) [id number] Page 1 of 7 Visit Report for 10/11/2022 Arrival Information Details Patient Name: Date of Service: [name] 10/11/2022 2:15 PM Medical Record Number: [id number] Patient Account Number: [id number] Date of Birth/Sex: Treating RN: [name] (72 y.o. [name], [name] Primary Care [name]: [name] Other Clinician: Referring [name]: Treating [name]/Extender: [name] in Treatment: 35 Visit Information History Since Last Visit Added or deleted any medications: No Patient Arrived: [name] Any new allergies or adverse reactions: No Arrival Time: 14:30 Had a fall or experienced change in No Accompanied By: self activities of daily living that Christine affect Transfer Assistance: None risk of falls: Patient Identification Verified: Yes Signs or symptoms of abuse/neglect since last visito No Secondary Verification Process Completed: Yes Hospitalized since last visit: No Patient Requires Transmission-Based Precautions: No Implantable device outside of the clinic excluding No Patient Has Alerts: Yes cellular tissue based products placed in the center Patient Alerts: Patient on Blood Thinner since last visit: Has Dressing in Place as Prescribed: Yes Has Compression in Place as Prescribed: Yes Pain Present Now: No Electronic Signature(s) Signed: 10/11/2022 4:50:49 PM By: Deon Pilling RN, BSN Entered By: Deon Pilling on 10/11/2022 14:30:19 -------------------------------------------------------------------------------- Clinic Level of Care Assessment Details Patient Name: Date of Service: [name] 10/11/2022 2:15 PM Medical Record Number: [id number] Patient Account Number: [id number] Date of Birth/Sex: Treating RN: [name] (72 y.o. [name] Primary Care Jacqueli Pangallo: [name] Other Clinician: Referring Lam Mccubbins: Treating Josearmando Kuhnert/Extender: [name] in Treatment: 9 Clinic Level of Care Assessment Items TOOL 4 Quantity Score X- 1 0 Use when only an EandM is performed on FOLLOW-UP visit ASSESSMENTS - Nursing Assessment / Reassessment X- 1 10 Reassessment of Co-morbidities (includes updates in patient status) X- 1 5 Reassessment of Adherence to Treatment Plan ASSESSMENTS - Wound and Skin A ssessment / Reassessment X - Simple Wound Assessment / Reassessment - one wound 1 5 []$  - 0 Complex Wound Assessment / Reassessment - multiple wounds X- 1 10 Dermatologic / Skin Assessment (not related to wound area) ASSESSMENTS - Focused Assessment []$  - 0 Circumferential Edema Measurements - multi extremities []$  - 0 Nutritional Assessment / Counseling / Intervention [name], [name] ([id number]) [phone].pdf Page 2 of 7 []$  - 0 Lower Extremity Assessment (monofilament, tuning fork, pulses) []$  - 0 Peripheral Arterial Disease Assessment (using hand held doppler) ASSESSMENTS - Ostomy and/or Continence Assessment and Care []$  - 0 Incontinence Assessment and Management []$  - 0 Ostomy Care Assessment and Management (repouching, etc.) PROCESS - Coordination of Care X - Simple Patient / Family Education for ongoing care 1 15 []$  - 0 Complex (extensive) Patient / Family Education for ongoing care X- 1 10 Staff obtains Programmer, systems, Records, T Results / Process Orders est []$  - 0 Staff telephones HHA, Nursing Homes / Clarify orders / etc []$  - 0 Routine Transfer to another Facility (non-emergent condition) []$  - 0 Routine Hospital Admission (non-emergent condition) []$  - 0 New Admissions / Biomedical engineer / Ordering NPWT Apligraf, etc. , []$  - 0 Emergency Hospital Admission (emergent condition) X- 1 10 Simple Discharge Coordination []$  - 0 Complex (extensive) Discharge Coordination PROCESS - Special Needs []$  - 0 Pediatric / Minor Patient Management []$  - 0 Isolation Patient  Management []$  - 0 Hearing / Language / Visual special needs []$  - 0 Assessment of Community assistance (transportation, D/C planning, etc.) []$  - 0 Additional assistance / Altered mentation []$  - 0 Support Surface(s) Assessment (  bed, cushion, seat, etc.) INTERVENTIONS - Wound Cleansing / Measurement X - Simple Wound Cleansing - one wound 1 5 []$  - 0 Complex Wound Cleansing - multiple wounds X- 1 5 Wound Imaging (photographs - any number of wounds) []$  - 0 Wound Tracing (instead of photographs) X- 1 5 Simple Wound Measurement - one wound []$  - 0 Complex Wound Measurement - multiple wounds INTERVENTIONS - Wound Dressings []$  - 0 Small Wound Dressing one or multiple wounds []$  - 0 Medium Wound Dressing one or multiple wounds []$  - 0 Large Wound Dressing one or multiple wounds []$  - 0 Application of Medications - topical []$  - 0 Application of Medications - injection INTERVENTIONS - Miscellaneous []$  - 0 External ear exam []$  - 0 Specimen Collection (cultures, biopsies, blood, body fluids, etc.) []$  - 0 Specimen(s) / Culture(s) sent or taken to Lab for analysis []$  - 0 Patient Transfer (multiple staff / Civil Service fast streamer / Similar devices) []$  - 0 Simple Staple / Suture removal (25 or less) []$  - 0 Complex Staple / Suture removal (26 or more) []$  - 0 Hypo / Hyperglycemic Management (close monitor of Blood Glucose) [name], [name] ([id number].pdf Page 3 of 7 []$  - 0 Ankle / Brachial Index (ABI) - do not check if billed separately X- 1 5 Vital Signs Has the patient been seen at the hospital within the last three years: Yes Total Score: 85 Level Of Care: New/Established - Level 3 Electronic Signature(s) Signed: 10/11/2022 4:50:49 PM By: Deon Pilling RN, BSN Entered By: Deon Pilling on 10/11/2022 14:42:24 -------------------------------------------------------------------------------- Encounter Discharge Information Details Patient Name: Date of  Service: [name]. 10/11/2022 2:15 PM Medical Record Number: [id number] Patient Account Number: [id number] Date of Birth/Sex: Treating RN: [birth year][name] (72 y.o. [name] Primary Care Kaylin Schellenberg: [name] Other Clinician: Referring Tiara Maultsby: Treating [name]/Extender: [name] in Treatment: 64 Encounter Discharge Information Items Discharge Condition: Stable Ambulatory Status: Walker Discharge Destination: Home Transportation: Private Auto Accompanied By: self Schedule Follow-up Appointment: No Clinical Summary of Care: Electronic Signature(s) Signed: 10/11/2022 4:50:49 PM By: Deon Pilling RN, BSN Entered By: Deon Pilling on 10/11/2022 14:42:50 -------------------------------------------------------------------------------- Lower Extremity Assessment Details Patient Name: Date of Service: [name] 10/11/2022 2:15 PM Medical Record Number: [id number] Patient Account Number: [id number] Date of Birth/Sex: Treating RN: [birth year] (72 y.o. [name] Primary Care Carvin Almas: [name] Other Clinician: Referring Maraya Gwilliam: Treating [name]/Extender: [name] in Treatment: 46 Edema Assessment Assessed: Shirlyn Goltz: Yes] Patrice Paradise: No] Edema: [Left: Ye] [Right: s] Calf Left: Right: Point of Measurement: 31 cm From Medial Instep 46.5 cm Ankle Left: Right: Point of Measurement: 9 cm From Medial Instep 26 cm Electronic Signature(s) [name], [name] ([id number]) [phone].pdf Page 4 of 7 Signed: 10/11/2022 4:50:49 PM By: Deon Pilling RN, BSN Entered By: Deon Pilling on 10/11/2022 14:30:33 -------------------------------------------------------------------------------- Multi Wound Chart Details Patient Name: Date of Service: [name] 10/11/2022 2:15 PM Medical Record Number: [id number] Patient Account Number: [id number] Date of Birth/Sex: Treating  RN: [birth year][name] (72 y.o. F) Primary Care Alyra Patty: [name] Other Clinician: Referring Rania Prothero: Treating [name]/Extender: [name] in Treatment: 46 Vital Signs Height(in): 67 Pulse(bpm): 60 Weight(lbs): 340 Blood Pressure(mmHg): 165/87 Body Mass Index(BMI): 53.2 Temperature(F): 98.3 Respiratory Rate(breaths/min): 20 [1:Photos:] [N/A:N/A] Left, Lateral Lower Leg N/A N/A Wound Location: Trauma N/A N/A Wounding Event: Lymphedema N/A N/A Primary Etiology: Congestive Heart Failure, N/A N/A Comorbid History: Hypertension 10/18/2021 N/A N/A Date Acquired: 74 N/A  N/A Weeks of Treatment: Healed - Epithelialized N/A N/A Wound Status: No N/A N/A Wound Recurrence: 1 N/A N/A Clustered Quantity: 0x0x0 N/A N/A Measurements L x W x D (cm) 0 N/A N/A A (cm) : rea 0 N/A N/A Volume (cm) : 100.00% N/A N/A % Reduction in Area: 100.00% N/A N/A % Reduction in Volume: Full Thickness With Exposed Support N/A N/A Classification: Structures None Present N/A N/A Exudate Amount: Distinct, outline attached N/A N/A Wound Margin: None Present (0%) N/A N/A Granulation Amount: None Present (0%) N/A N/A Necrotic Amount: Fascia: No N/A N/A Exposed Structures: Fat Layer (Subcutaneous Tissue): No Tendon: No Muscle: No Joint: No Bone: No Large (67-100%) N/A N/A Epithelialization: Excoriation: No N/A N/A Periwound Skin Texture: Induration: No Callus: No Crepitus: No Rash: No Scarring: No Maceration: No N/A N/A Periwound Skin Moisture: Dry/Scaly: No Atrophie Blanche: No N/A N/A Periwound Skin Color: Cyanosis: No Ecchymosis: No Erythema: No Hemosiderin Staining: No Mottled: No Pallor: No [name], [name] ([id number]) [phone].pdf Page 5 of 7 Rubor: No No Abnormality N/A N/A Temperature: Yes N/A N/A Tenderness on Palpation: Treatment Notes Electronic Signature(s) Signed: 10/11/2022 3:41:53 PM By: Kalman Shan DO Entered By: Kalman Shan on 10/11/2022 15:19:27 -------------------------------------------------------------------------------- Multi-Disciplinary Care Plan Details Patient Name: Date of Service: [name]. 10/11/2022 2:15 PM Medical Record Number: [id number] Patient Account Number: [id number] Date of Birth/Sex: Treating RN: [name][birth year] (72 y.o. [name] Primary Care Samiksha Pellicano: [name] Other Clinician: Referring [name]: Treating [name]/Extender: [name] in Treatment: 28 Active Inactive Electronic Signature(s) Signed: 10/11/2022 4:50:49 PM By: Deon Pilling RN, BSN Entered By: Deon Pilling on 10/11/2022 14:41:59 -------------------------------------------------------------------------------- Pain Assessment Details Patient Name: Date of Service: [name] 10/11/2022 2:15 PM Medical Record Number: [id number] Patient Account Number: [id number] Date of Birth/Sex: Treating RN: [birth year] (72 y.o. [name] Primary Care Jen Benedict: [name] Other Clinician: Referring Alec Mcphee: Treating [name]/Extender: [name] in [id number] Active Problems Location of Pain Severity and Description of Pain Patient Has Paino No Site Locations Rate the pain. Current Pain Level: 0 [name], [name] ([id number]) [phone].pdf Page 6 of 7 Pain Management and Medication Current Pain Management: Medication: No Cold Application: No Rest: No Massage: No Activity: No T.E.N.S.: No Heat Application: No Leg drop or elevation: No Is the Current Pain Management Adequate: Adequate How does your wound impact your activities of daily livingo Sleep: No Bathing: No Appetite: No Relationship With Others: No Bladder Continence: No Emotions: No Bowel Continence: No Work: No Toileting: No Drive: No Dressing: No Hobbies: No Engineer, maintenance) Signed:  10/11/2022 4:50:49 PM By: Deon Pilling RN, BSN Entered By: Deon Pilling on 10/11/2022 14:30:29 -------------------------------------------------------------------------------- Wound Assessment Details Patient Name: Date of Service: [name] 10/11/2022 2:15 PM Medical Record Number: [id number] Patient Account Number: [id number] Date of Birth/Sex: Treating RN: [name] (72 y.o. [name], [name] Primary Care Chanin Frumkin: [name] Other Clinician: Referring Thuy Atilano: Treating Kayle Correa/Extender: [name] in Treatment: 46 Wound Status Wound Number: 1 Primary Etiology: Lymphedema Wound Location: Left, Lateral Lower Leg Wound Status: Healed - Epithelialized Wounding Event: Trauma Comorbid History: Congestive Heart Failure, Hypertension Date Acquired: 10/18/2021 Weeks Of Treatment: 46 Clustered Wound: No Photos Wound Measurements Length: (cm) Width: (cm) Depth: (cm) Clustered Quantity: Area: (cm) Volume: (cm) 0 % Reduction in Area: 100% 0 % Reduction in Volume: 100% 0 Epithelialization: Large (67-100%) 1 Tunneling: No 0 Undermining: No 0 Wound Description Classification: Full Thickness With Exposed  Support Wound Margin: Distinct, outline attached Exudate Amount: None Present ZARIANA, CHARO ([id number]) Wound Bed Granulation Amount: None Present (0%) Necrotic Amount: None Present (0%) Structures Foul Odor After Cleansing: No Slough/Fibrino No [id number] Page 7 of 7 Exposed Structure Fascia Exposed: No Fat Layer (Subcutaneous Tissue) Exposed: No Tendon Exposed: No Muscle Exposed: No Joint Exposed: No Bone Exposed: No Periwound Skin Texture Texture Color No Abnormalities Noted: No No Abnormalities Noted: No Callus: No Atrophie Blanche: No Crepitus: No Cyanosis: No Excoriation: No Ecchymosis: No Induration: No Erythema: No Rash: No Hemosiderin Staining: No Scarring: No Mottled:  No Pallor: No Moisture Rubor: No No Abnormalities Noted: No Dry / Scaly: No Temperature / Pain Maceration: No Temperature: No Abnormality Tenderness on Palpation: Yes Electronic Signature(s) Signed: 10/11/2022 4:50:49 PM By: Deon Pilling RN, BSN Entered By: Deon Pilling on 10/11/2022 14:40:45 -------------------------------------------------------------------------------- Vitals Details Patient Name: Date of Service: [name]. 10/11/2022 2:15 PM Medical Record Number: [id number] Patient Account Number: [id number] Date of Birth/Sex: Treating RN: [birth year] (72 y.o. [name], [name] Primary Care Letasha Kershaw: [name] Other Clinician: Referring Darnise Montag: Treating Kimberlyann Hollar/Extender: [name] in Treatment: 54 Vital Signs Time Taken: 14:40 Temperature (F): 98.3 Height (in): 67 Pulse (bpm): 60 Weight (lbs): 340 Respiratory Rate (breaths/min): 20 Body Mass Index (BMI): 53.2 Blood Pressure (mmHg): 165/87 Reference Range: 80 - 120 mg / dl Electronic Signature(s) Signed: 10/11/2022 4:50:49 PM By: Deon Pilling RN, BSN Entered By: Deon Pilling on 10/11/2022 14:40:36

## 2022-10-21 DIAGNOSIS — F325 Major depressive disorder, single episode, in full remission: Secondary | ICD-10-CM | POA: Diagnosis not present

## 2022-10-21 DIAGNOSIS — Z9181 History of falling: Secondary | ICD-10-CM | POA: Diagnosis not present

## 2022-10-21 DIAGNOSIS — E039 Hypothyroidism, unspecified: Secondary | ICD-10-CM | POA: Diagnosis not present

## 2022-10-21 DIAGNOSIS — Z6841 Body Mass Index (BMI) 40.0 and over, adult: Secondary | ICD-10-CM | POA: Diagnosis not present

## 2022-10-21 DIAGNOSIS — Z96653 Presence of artificial knee joint, bilateral: Secondary | ICD-10-CM | POA: Diagnosis not present

## 2022-10-21 DIAGNOSIS — I4891 Unspecified atrial fibrillation: Secondary | ICD-10-CM | POA: Diagnosis not present

## 2022-10-21 DIAGNOSIS — T24332D Burn of third degree of left lower leg, subsequent encounter: Secondary | ICD-10-CM | POA: Diagnosis not present

## 2022-10-21 DIAGNOSIS — D500 Iron deficiency anemia secondary to blood loss (chronic): Secondary | ICD-10-CM | POA: Diagnosis not present

## 2022-10-21 DIAGNOSIS — I5032 Chronic diastolic (congestive) heart failure: Secondary | ICD-10-CM | POA: Diagnosis not present

## 2022-10-21 DIAGNOSIS — J309 Allergic rhinitis, unspecified: Secondary | ICD-10-CM | POA: Diagnosis not present

## 2022-10-21 DIAGNOSIS — I110 Hypertensive heart disease with heart failure: Secondary | ICD-10-CM | POA: Diagnosis not present

## 2022-10-21 DIAGNOSIS — Z9049 Acquired absence of other specified parts of digestive tract: Secondary | ICD-10-CM | POA: Diagnosis not present

## 2022-10-24 ENCOUNTER — Other Ambulatory Visit: Payer: Self-pay | Admitting: Internal Medicine

## 2022-12-15 DIAGNOSIS — I480 Paroxysmal atrial fibrillation: Secondary | ICD-10-CM | POA: Diagnosis not present

## 2022-12-15 DIAGNOSIS — N182 Chronic kidney disease, stage 2 (mild): Secondary | ICD-10-CM | POA: Diagnosis not present

## 2022-12-15 DIAGNOSIS — E559 Vitamin D deficiency, unspecified: Secondary | ICD-10-CM | POA: Diagnosis not present

## 2022-12-15 DIAGNOSIS — Z7901 Long term (current) use of anticoagulants: Secondary | ICD-10-CM | POA: Diagnosis not present

## 2022-12-15 DIAGNOSIS — I10 Essential (primary) hypertension: Secondary | ICD-10-CM | POA: Diagnosis not present

## 2022-12-15 DIAGNOSIS — E039 Hypothyroidism, unspecified: Secondary | ICD-10-CM | POA: Diagnosis not present

## 2023-04-17 ENCOUNTER — Other Ambulatory Visit: Payer: Self-pay | Admitting: Internal Medicine

## 2023-04-17 DIAGNOSIS — I480 Paroxysmal atrial fibrillation: Secondary | ICD-10-CM

## 2023-04-17 NOTE — Telephone Encounter (Signed)
Prescription refill request for Eliquis received. Indication: Afib  Last office visit: 04/26/22 Graciela Husbands)  Scr: 1.08 (06/01/22)  Age: 71 Weight: 145.6kg  Appropriate dose. Refill sent.

## 2023-04-20 ENCOUNTER — Other Ambulatory Visit: Payer: Self-pay

## 2023-04-20 ENCOUNTER — Encounter (HOSPITAL_BASED_OUTPATIENT_CLINIC_OR_DEPARTMENT_OTHER): Payer: Self-pay | Admitting: Emergency Medicine

## 2023-04-20 ENCOUNTER — Emergency Department (HOSPITAL_BASED_OUTPATIENT_CLINIC_OR_DEPARTMENT_OTHER): Payer: PPO

## 2023-04-20 ENCOUNTER — Emergency Department (HOSPITAL_BASED_OUTPATIENT_CLINIC_OR_DEPARTMENT_OTHER)
Admission: EM | Admit: 2023-04-20 | Discharge: 2023-04-21 | Disposition: A | Discharge status: Home / Self Care | Payer: PPO | Attending: Emergency Medicine | Admitting: Emergency Medicine

## 2023-04-20 DIAGNOSIS — I4891 Unspecified atrial fibrillation: Secondary | ICD-10-CM | POA: Diagnosis not present

## 2023-04-20 DIAGNOSIS — R1013 Epigastric pain: Secondary | ICD-10-CM

## 2023-04-20 DIAGNOSIS — Z7901 Long term (current) use of anticoagulants: Secondary | ICD-10-CM | POA: Insufficient documentation

## 2023-04-20 DIAGNOSIS — R109 Unspecified abdominal pain: Secondary | ICD-10-CM | POA: Diagnosis present

## 2023-04-20 DIAGNOSIS — K449 Diaphragmatic hernia without obstruction or gangrene: Secondary | ICD-10-CM | POA: Diagnosis not present

## 2023-04-20 DIAGNOSIS — R1084 Generalized abdominal pain: Secondary | ICD-10-CM | POA: Insufficient documentation

## 2023-04-20 DIAGNOSIS — K2980 Duodenitis without bleeding: Secondary | ICD-10-CM | POA: Diagnosis not present

## 2023-04-20 DIAGNOSIS — D735 Infarction of spleen: Secondary | ICD-10-CM | POA: Diagnosis not present

## 2023-04-20 DIAGNOSIS — K5730 Diverticulosis of large intestine without perforation or abscess without bleeding: Secondary | ICD-10-CM | POA: Diagnosis not present

## 2023-04-20 LAB — COMPREHENSIVE METABOLIC PANEL
ALT: 13 U/L (ref 0–44)
AST: 19 U/L (ref 15–41)
Albumin: 3.8 g/dL (ref 3.5–5.0)
Alkaline Phosphatase: 91 U/L (ref 38–126)
Anion gap: 10 (ref 5–15)
BUN: 23 mg/dL (ref 8–23)
CO2: 30 mmol/L (ref 22–32)
Calcium: 9 mg/dL (ref 8.9–10.3)
Chloride: 100 mmol/L (ref 98–111)
Creatinine, Ser: 0.95 mg/dL (ref 0.44–1.00)
GFR, Estimated: >60 mL/min (ref >60)
Glucose, Bld: 87 mg/dL (ref 70–99)
Potassium: 4.4 mmol/L (ref 3.5–5.1)
Sodium: 140 mmol/L (ref 135–145)
Total Bilirubin: 0.6 mg/dL (ref 0.3–1.2)
Total Protein: 7.9 g/dL (ref 6.5–8.1)

## 2023-04-20 LAB — CBC WITH DIFFERENTIAL/PLATELET
Abs Immature Granulocytes: 0.04 10*3/uL (ref 0.00–0.07)
Basophils Absolute: 0.1 10*3/uL (ref 0.0–0.1)
Basophils Relative: 1 %
Eosinophils Absolute: 0.2 10*3/uL (ref 0.0–0.5)
Eosinophils Relative: 2 %
HCT: 40.8 % (ref 36.0–46.0)
Hemoglobin: 13.3 g/dL (ref 12.0–15.0)
Immature Granulocytes: 0 %
Lymphocytes Relative: 14 %
Lymphs Abs: 1.3 10*3/uL (ref 0.7–4.0)
MCH: 30.4 pg (ref 26.0–34.0)
MCHC: 32.6 g/dL (ref 30.0–36.0)
MCV: 93.4 fL (ref 80.0–100.0)
Monocytes Absolute: 0.6 10*3/uL (ref 0.1–1.0)
Monocytes Relative: 6 %
Neutro Abs: 7.5 10*3/uL (ref 1.7–7.7)
Neutrophils Relative %: 77 %
Platelets: 223 10*3/uL (ref 150–400)
RBC: 4.37 MIL/uL (ref 3.87–5.11)
RDW: 13.3 % (ref 11.5–15.5)
WBC: 9.7 10*3/uL (ref 4.0–10.5)
nRBC: 0 % (ref 0.0–0.2)

## 2023-04-20 LAB — URINALYSIS, ROUTINE W REFLEX MICROSCOPIC
Bilirubin Urine: NEGATIVE
Glucose, UA: NEGATIVE mg/dL
Hgb urine dipstick: NEGATIVE
Ketones, ur: NEGATIVE mg/dL
Leukocytes,Ua: NEGATIVE
Nitrite: NEGATIVE
Protein, ur: NEGATIVE mg/dL
Specific Gravity, Urine: 1.011 (ref 1.005–1.030)
pH: 5 (ref 5.0–8.0)

## 2023-04-20 LAB — LIPASE, BLOOD: Lipase: 50 U/L (ref 11–51)

## 2023-04-20 MED ORDER — IOHEXOL 300 MG/ML  SOLN
100.0000 mL | Freq: Once | INTRAMUSCULAR | Status: AC | PRN
  Administered 2023-04-20: 100 mL via INTRAVENOUS

## 2023-04-20 NOTE — ED Provider Notes (Signed)
[place] EMERGENCY DEPARTMENT AT [facility] Provider Note   CSN: [id number] Arrival date & time: 04/20/23  1741     History  Chief Complaint  Patient presents with   Abdominal Pain    [name] is a 72 y.o. female with A-fib on Eliquis, hx of splenic infarct, presenting to ED with abdominal pain.  Reports gradual onset of diffuse generalized abdominal pain, mostly right upper and left upper quadrant.  Denies nausea or vomiting.  Reports of loose bowel movements.  Denies constipation.  Reports history of cholecystectomy.  Also ports history of a splenic infarct in the past.  HPI     Home Medications Prior to Admission medications   Medication Sig Start Date End Date Taking? Authorizing Provider  acetaminophen (TYLENOL) 500 MG tablet Take 1,000 mg by mouth every 6 (six) hours as needed for mild pain or headache.    [provider]  ELIQUIS 5 MG TABS tablet TAKE 1 TABLET BY MOUTH 2 TIMES DAILY. 04/17/23   Duke Salvia, MD  escitalopram (LEXAPRO) 5 MG tablet Take 5 mg by mouth daily. 12/28/20   [provider]  flecainide (TAMBOCOR) 100 MG tablet Take 1 tablet (100 mg total) by mouth 2 (two) times daily. 04/26/22   Duke Salvia, MD  furosemide (LASIX) 20 MG tablet Take 40 mg by mouth every morning.     [provider]  levothyroxine (SYNTHROID) 88 MCG tablet Take 88 mcg by mouth every morning. 04/08/22   [provider]  losartan (COZAAR) 50 MG tablet Take 50 mg by mouth daily. 11/17/21   [provider]  metoprolol tartrate (LOPRESSOR) 50 MG tablet TAKE 1 TABLET BY MOUTH 2 TIMES DAILY. 10/24/22   Duke Salvia, MD  vitamin C (ASCORBIC ACID) 500 MG tablet Take 500 mg by mouth daily.    [provider]  zinc gluconate 50 MG tablet Take 50 mg by mouth daily.    [provider]      Allergies    Augmentin [amoxicillin-pot clavulanate] and Pneumococcal vaccines    Review of Systems   Review of  Systems  Physical Exam Updated Vital Signs BP (!) 167/74   Pulse (!) 59   Temp 97.6 F (36.4 C) (Oral)   Resp 20   Wt (!) 150 kg   SpO2 96%   BMI 51.79 kg/m  Physical Exam Constitutional:      General: She is not in acute distress.    Appearance: She is obese.  HENT:     Head: Normocephalic and atraumatic.  Eyes:     Conjunctiva/sclera: Conjunctivae normal.     Pupils: Pupils are equal, round, and reactive to light.  Cardiovascular:     Rate and Rhythm: Normal rate and regular rhythm.  Pulmonary:     Effort: Pulmonary effort is normal. No respiratory distress.  Abdominal:     General: There is no distension.     Tenderness: There is abdominal tenderness in the right upper quadrant, epigastric area and left upper quadrant. There is no guarding or rebound.  Skin:    General: Skin is warm and dry.  Neurological:     General: No focal deficit present.     Mental Status: She is alert. Mental status is at baseline.  Psychiatric:        Mood and Affect: Mood normal.        Behavior: Behavior normal.     ED Results / Procedures / Treatments   Labs (  all labs ordered are listed, but only abnormal results are displayed) Labs Reviewed  COMPREHENSIVE METABOLIC PANEL  LIPASE, BLOOD  CBC WITH DIFFERENTIAL/PLATELET  URINALYSIS, ROUTINE W REFLEX MICROSCOPIC    EKG None  Radiology No results found.  Procedures Procedures    Medications Ordered in ED Medications - No data to display  ED Course/ Medical Decision Making/ A&P                                 Medical Decision Making Amount and/or Complexity of Data Reviewed Labs: ordered. Radiology: ordered.  Risk Prescription drug management.   This patient presents to the ED with concern for abdominal pain. This involves an extensive number of treatment options, and is a complaint that carries with it a high risk of complications and morbidity.  The differential diagnosis includes colitis versus ureteral colic  versus pancreatitis versus other   I ordered and personally interpreted labs.  The pertinent results include: No emergent findings  I ordered imaging studies including CT abdomen pelvis, which was pending at the time of signout  The patient was maintained on a cardiac monitor.  I personally viewed and interpreted the cardiac monitored which showed an underlying rhythm of: NSR  I have reviewed the patients home medicines and have made adjustments as needed  Test Considered: doubt pelvic pathology; doubt PE; doubt mesenteric ischemia   Dispostion:  Patient signed out to Dr Geoffery Lyons EDP at 11:30 pm pending follow up on CT imaging         Final Clinical Impression(s) / ED Diagnoses Final diagnoses:  None    Rx / DC Orders ED Discharge Orders     None         Terald Sleeper, MD 04/21/23 1102

## 2023-04-20 NOTE — ED Notes (Signed)
Pt. To restroom to obtain urine specimen

## 2023-04-20 NOTE — ED Triage Notes (Signed)
Patient c/o generalized abdominal pain x 2 days.  Patient denies n/v/d. Patient has history of splenic infarct.

## 2023-04-21 MED ORDER — OMEPRAZOLE 20 MG PO CPDR: 20.0000 mg | DELAYED_RELEASE_CAPSULE | Freq: Two times a day (BID) | ORAL | 1 refills | Status: AC

## 2023-04-21 MED ORDER — HYDROCODONE-ACETAMINOPHEN 5-325 MG PO TABS: 1.0000 | ORAL_TABLET | Freq: Four times a day (QID) | ORAL | 0 refills | Status: DC | PRN

## 2023-04-21 NOTE — Discharge Instructions (Signed)
Begin taking Prilosec as prescribed.  Begin taking hydrocodone as prescribed as needed for pain.  Follow-up with your primary doctor if not improving in the next few days, and return to the ER if you develop worsening pain, high fever, bloody stools, or for other new and concerning symptoms.

## 2023-04-21 NOTE — ED Provider Notes (Signed)
  Physical Exam  BP (!) 148/74   Pulse 64   Temp 97.6 F (36.4 C) (Oral)   Resp 20   Wt (!) 150 kg   SpO2 96%   BMI 51.79 kg/m   Physical Exam Vitals and nursing note reviewed.  Constitutional:      Appearance: She is well-developed.  HENT:     Head: Normocephalic.  Abdominal:     Tenderness: There is abdominal tenderness in the epigastric area.  Skin:    General: Skin is warm and dry.  Neurological:     Mental Status: She is alert and oriented to person, place, and time.     Procedures  Procedures  ED Course / MDM    Medical Decision Making Amount and/or Complexity of Data Reviewed Labs: ordered. Radiology: ordered.  Risk Prescription drug management.   Care assumed from Dr. Renaye Rakers at shift change.  Patient presenting here with complaints of abdominal pain for the past 2 days.  Care signed out awaiting results of CT scan to further determine the cause of her symptoms.  This study has resulted and shows duodenitis.  I am uncertain whether this is viral or inflammatory.  Patient will be treated with omeprazole and pain medication.  She has to follow-up as needed if not improving.       Geoffery Lyons, MD 04/21/23 [phone]

## 2023-04-21 NOTE — ED Notes (Signed)
Patient verbalizes understanding of discharge instructions. Opportunity for questioning and answers were provided. Armband removed by staff, pt discharged from ED. Wheeled out to lobby with son, assisted into car

## 2023-05-01 ENCOUNTER — Other Ambulatory Visit: Payer: Self-pay | Admitting: Internal Medicine

## 2023-05-02 ENCOUNTER — Ambulatory Visit: Payer: PPO | Attending: Internal Medicine | Admitting: Internal Medicine

## 2023-05-02 ENCOUNTER — Encounter: Payer: Self-pay | Admitting: Internal Medicine

## 2023-05-02 VITALS — BP 140/88 | HR 53 | Ht 66.0 in | Wt 340.0 lb

## 2023-05-02 DIAGNOSIS — Z959 Presence of cardiac and vascular implant and graft, unspecified: Secondary | ICD-10-CM | POA: Diagnosis not present

## 2023-05-02 DIAGNOSIS — I5032 Chronic diastolic (congestive) heart failure: Secondary | ICD-10-CM

## 2023-05-02 DIAGNOSIS — I480 Paroxysmal atrial fibrillation: Secondary | ICD-10-CM | POA: Diagnosis not present

## 2023-05-02 DIAGNOSIS — Z9889 Other specified postprocedural states: Secondary | ICD-10-CM

## 2023-05-02 DIAGNOSIS — D735 Infarction of spleen: Secondary | ICD-10-CM

## 2023-05-02 DIAGNOSIS — I4819 Other persistent atrial fibrillation: Secondary | ICD-10-CM

## 2023-05-02 MED ORDER — FLECAINIDE ACETATE 100 MG PO TABS
100.0000 mg | ORAL_TABLET | Freq: Two times a day (BID) | ORAL | 3 refills | Status: DC
Start: 2023-05-02 — End: 2024-04-30

## 2023-05-02 NOTE — Progress Notes (Unsigned)
Patient ID: [name], female   DOB: [birth year], 72 y.o.   MRN: [id number]       Patient Care Team: [name], MD as PCP - General (Internal Medicine)   HPI  [name] is a 72 y.o. female Seen in follow-up for splenic infarct for which she received a Linq monitor (RRT 1/23).  Intercurrently  diagnosed with atrial fibrillation with symptomatic recurrences associated with dyspnea.    On Flecainide.   Anticoagulation (apixaban) and is on [employer] patient support; nothing but superficial bleeding.    2019 negative sleep study  5/23 hospitalized with a lower leg cellulitis and a chronic ulcer in the setting of her morbid obesity    The patient denies chest pain, nocturnal dyspnea, orthopnea .  There have been no lightheadedness or syncope.  Complains of chronic shortness of breath afternoon dependent edema.  Continues to fear falls.  Occasional brief palpitations  DATE TEST EF   4/19 Echo   65-70 % LA size ULN          Date Cr K Hgb  11/21 0.91 4.0 14.0   12/21 0.84 4.2   4/22   0.83 5.4 13.3  1/23 0.94 4.0 12.8   5/23 0.99 3.7 8.5  8/24 0.95 4.4 13.3   DATE PR interval QRSduration Dose  12/21  184 118 0  7/22 188 120 100  1/23 190 128 100  9/23 18-240 124 100  9/24 212 114 100     Thromboembolic risk factors ( age -87, HTN-1, TIA/CVA-2, Gender-1) for a CHADSVASc Score of >=5  Past Medical History:  Diagnosis Date   Acute respiratory disease due to COVID-19 virus 09/08/2021   Anxiety disorder 09/08/2021   Arthritis    CHF (congestive heart failure) (HCC)    Chronic diastolic heart failure (HCC) 09/08/2021   Chronic kidney disease due to hypertension 09/08/2021   Class 3 obesity (HCC) 09/08/2021   Essential hypertension 10/15/2015   Gastroesophageal reflux disease 09/08/2021   Heart murmur    Hypertension    Hypothyroidism 10/15/2015   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   OA (osteoarthritis) of knee 04/04/2016   Paroxysmal atrial fibrillation (HCC)  09/08/2021   Tobacco user 09/08/2021   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"   Vitamin D deficiency 09/08/2021    Past Surgical History:  Procedure Laterality Date   CARDIOVERSION N/A 02/16/2021   Procedure: CARDIOVERSION;  Surgeon: Duke Salvia, MD;  Location: [facility] ORS;  Service: Cardiovascular;  Laterality: N/A;   CHOLECYSTECTOMY     DILATION AND CURETTAGE OF UTERUS     LOOP RECORDER INSERTION N/A 12/21/2017   Procedure: LOOP RECORDER INSERTION;  Surgeon: Duke Salvia, MD;  Location: [facility] INVASIVE CV LAB;  Service: Cardiovascular;  Laterality: N/A;   OMENTECTOMY N/A 10/12/2015   Procedure: PARTIAL OMENTECTOMY;  Surgeon: Violeta Gelinas, MD;  Location: [facility] OR;  Service: General;  Laterality: N/A;   TOTAL KNEE ARTHROPLASTY Left 04/04/2016   Procedure: LEFT TOTAL KNEE ARTHROPLASTY;  Surgeon: Ollen Gross, MD;  Location: [facility] ORS;  Service: Orthopedics;  Laterality: Left;   TOTAL KNEE ARTHROPLASTY Right 08/01/2016   Procedure: TOTAL KNEE ARTHROPLASTY;  Surgeon: Ollen Gross, MD;  Location: [facility] ORS;  Service: Orthopedics;  Laterality: Right;   UMBILICAL HERNIA REPAIR N/A 10/12/2015   Procedure: HERNIA REPAIR UMBILICAL ADULT/INCARERATED;  Surgeon: Violeta Gelinas, MD;  Location: [facility] OR;  Service: General;  Laterality: N/A;    Current Meds  Medication Sig  acetaminophen (TYLENOL) 500 MG tablet Take 1,000 mg by mouth every 6 (six) hours as needed for mild pain or headache.   clobetasol cream (TEMOVATE) 0.05 % Apply topically as needed (rash).   ELIQUIS 5 MG TABS tablet TAKE 1 TABLET BY MOUTH 2 TIMES DAILY.   escitalopram (LEXAPRO) 5 MG tablet Take 5 mg by mouth daily.   flecainide (TAMBOCOR) 100 MG tablet Take 1 tablet (100 mg total) by mouth 2 (two) times daily.   furosemide (LASIX) 20 MG tablet Take 40 mg by mouth every morning.    levothyroxine (SYNTHROID) 88 MCG tablet Take 88 mcg by mouth every morning.   losartan (COZAAR) 50 MG tablet Take 50 mg by mouth daily.    metoprolol tartrate (LOPRESSOR) 50 MG tablet TAKE 1 TABLET BY MOUTH 2 TIMES DAILY.   omeprazole (PRILOSEC) 20 MG capsule Take 1 capsule (20 mg total) by mouth 2 (two) times daily before a meal.   vitamin C (ASCORBIC ACID) 500 MG tablet Take 500 mg by mouth daily.   zinc gluconate 50 MG tablet Take 50 mg by mouth daily.   [DISCONTINUED] HYDROcodone-acetaminophen (NORCO) 5-325 MG tablet Take 1-2 tablets by mouth every 6 (six) hours as needed.    Allergies  Allergen Reactions   Augmentin [Amoxicillin-Pot Clavulanate] Hives and Itching    Has patient had a PCN reaction causing immediate rash, facial/tongue/throat swelling, SOB or lightheadedness with hypotension:No Has patient had a PCN reaction causing severe rash involving mucus membranes or skin necrosis:No Has patient had a PCN reaction that required hospitalization:No Has patient had a PCN reaction occurring within the last 10 years:No If all of the above answers are "NO", then may proceed with Cephalosporin use.    Pneumococcal Vaccines Other (See Comments)    Caused fever, and swelling at injection site   Review of Systems negative except from HPI and PMH  Physical Exam    BP (!) 140/88   Pulse (!) 53   Ht 5\' 6"  (1.676 m)   Wt (!) 340 lb (154.2 kg)   SpO2 94%   BMI 54.88 kg/m  Well developed and Morbidly obese  in no acute distress HENT normal Neck supple with JVP-flat Clear Device pocket well healed; without hematoma or erythema.  There is no tethering  Regular rate and rhythm, no  gallop No  murmur Abd-soft with active BS No Clubbing cyanosis tr edema Skin-warm and dry A & Oriented  Grossly normal sensory and motor function     Assessment and  Plan  Splenic infarct  Atrial fibrillation-persistent   Sinus bradycardia less than 5% of her beats greater than 70  Sleep disordered breathing with a negative sleep study  Morbid obesity   Hypertension    Depression  COVID interval 1/23  A atrial fibrillation  largely quiescent.  Will continue the flecainide at 100 mg twice daily in conjunction with metoprolol.  No bleeding.  Will continue her Eliquis 5 mg twice daily.  Blood pressure is borderline.  Continue losartan and metoprolol.    Volume status is stable.  Continue Lasix 40 mg twice daily *

## 2023-05-02 NOTE — Patient Instructions (Signed)
Medication Instructions:  - Your physician recommends that you continue on your current medications as directed. Please refer to the Current Medication list given to you today.  *If you need a refill on your cardiac medications before your next appointment, please call your pharmacy*   Lab Work: - none ordered  If you have labs (blood work) drawn today and your tests are completely normal, you will receive your results only by: MyChart Message (if you have MyChart) OR A paper copy in the mail If you have any lab test that is abnormal or we need to change your treatment, we will call you to review the results.   Testing/Procedures: - none ordered   Follow-Up: At [facility], you and your health needs are our priority.  As part of our continuing mission to provide you with exceptional heart care, we have created designated Provider Care Teams.  These Care Teams include your primary Cardiologist (physician) and Advanced Practice Providers (APPs -  Physician Assistants and Nurse Practitioners) who all work together to provide you with the care you need, when you need it.  We recommend signing up for the patient portal called "MyChart".  Sign up information is provided on this After Visit Summary.  MyChart is used to connect with patients for Virtual Visits (Telemedicine).  Patients are able to view lab/test results, encounter notes, upcoming appointments, etc.  Non-urgent messages can be sent to your provider as well.   To learn more about what you can do with MyChart, go to [email or web address].    Your next appointment:   1 year(s)  Provider:   Steven Klein, MD    Other Instructions N/a  

## 2023-05-31 ENCOUNTER — Telehealth: Payer: Self-pay

## 2023-05-31 DIAGNOSIS — K2980 Duodenitis without bleeding: Secondary | ICD-10-CM | POA: Diagnosis not present

## 2023-05-31 DIAGNOSIS — I10 Essential (primary) hypertension: Secondary | ICD-10-CM | POA: Diagnosis not present

## 2023-05-31 DIAGNOSIS — K219 Gastro-esophageal reflux disease without esophagitis: Secondary | ICD-10-CM | POA: Diagnosis not present

## 2023-05-31 NOTE — Patient Instructions (Signed)
Visit Information  Thank you for taking time to visit with me today. Please don't hesitate to contact me if I can be of assistance to you.   Following are the goals we discussed today:   Goals Addressed             This Visit's Progress    COMPLETED: Care Coordination Activities-No follow up required       Care Coordination Interventions: Advised patient to Annual Wellness exam. Discussed [facility] services and support. Assessed SDOH. Advised to discuss with primary care physician if services needed in the future.          If you are experiencing a Mental Health or Behavioral Health Crisis or need someone to talk to, please call the Suicide and Crisis Lifeline: 988   Patient verbalizes understanding of instructions and care plan provided today and agrees to view in MyChart. Active MyChart status and patient understanding of how to access instructions and care plan via MyChart confirmed with patient.     The patient has been provided with contact information for the care management team and has been advised to call with any health related questions or concerns.   Bary Leriche, RN, MSN [facility], [facility] Management Community Coordinator Direct Dial: [phone]  Fax: [phone] Website: [email or web address]

## 2023-05-31 NOTE — Patient Outreach (Signed)
Care Coordination   In Person Provider Office Visit Note   05/31/2023 Name: [name] MRN: [id number] DOB: 02/25/1951  [name] is a 72 y.o. year old female who sees [name], MD for primary care. I engaged with [name] in the providers office today.  What matters to the patients health and wellness today?  none    Goals Addressed             This Visit's Progress    COMPLETED: Care Coordination Activities-No follow up required       Care Coordination Interventions: Advised patient to Annual Wellness exam. Discussed [facility] services and support. Assessed SDOH. Advised to discuss with primary care physician if services needed in the future.        SDOH assessments and interventions completed:  Yes  SDOH Interventions Today    Flowsheet Row Most Recent Value  SDOH Interventions   Health Literacy Interventions Intervention Not Indicated        Care Coordination Interventions:  Yes, provided   Follow up plan: No further intervention required.   Encounter Outcome:  Patient Visit Completed   Christine Leriche, RN, MSN [facility]  [facility], [facility] Management Community Coordinator Direct Dial: [phone]  Fax: [phone] Website: [email or web address]

## 2023-06-15 DIAGNOSIS — I10 Essential (primary) hypertension: Secondary | ICD-10-CM | POA: Diagnosis not present

## 2023-06-15 DIAGNOSIS — E039 Hypothyroidism, unspecified: Secondary | ICD-10-CM | POA: Diagnosis not present

## 2023-06-15 DIAGNOSIS — E559 Vitamin D deficiency, unspecified: Secondary | ICD-10-CM | POA: Diagnosis not present

## 2023-06-15 DIAGNOSIS — Z0000 Encounter for general adult medical examination without abnormal findings: Secondary | ICD-10-CM | POA: Diagnosis not present

## 2023-06-15 DIAGNOSIS — E7800 Pure hypercholesterolemia, unspecified: Secondary | ICD-10-CM | POA: Diagnosis not present

## 2023-06-15 DIAGNOSIS — E669 Obesity, unspecified: Secondary | ICD-10-CM | POA: Diagnosis not present

## 2023-06-15 DIAGNOSIS — D500 Iron deficiency anemia secondary to blood loss (chronic): Secondary | ICD-10-CM | POA: Diagnosis not present

## 2023-06-16 LAB — LAB REPORT - SCANNED: EGFR: 61

## 2023-06-22 DIAGNOSIS — N3000 Acute cystitis without hematuria: Secondary | ICD-10-CM | POA: Diagnosis not present

## 2023-06-22 DIAGNOSIS — F3340 Major depressive disorder, recurrent, in remission, unspecified: Secondary | ICD-10-CM | POA: Diagnosis not present

## 2023-06-22 DIAGNOSIS — I480 Paroxysmal atrial fibrillation: Secondary | ICD-10-CM | POA: Diagnosis not present

## 2023-06-22 DIAGNOSIS — Z0000 Encounter for general adult medical examination without abnormal findings: Secondary | ICD-10-CM | POA: Diagnosis not present

## 2023-06-22 DIAGNOSIS — Z23 Encounter for immunization: Secondary | ICD-10-CM | POA: Diagnosis not present

## 2023-06-22 DIAGNOSIS — Z7901 Long term (current) use of anticoagulants: Secondary | ICD-10-CM | POA: Diagnosis not present

## 2023-06-22 DIAGNOSIS — I10 Essential (primary) hypertension: Secondary | ICD-10-CM | POA: Diagnosis not present

## 2023-06-22 DIAGNOSIS — E039 Hypothyroidism, unspecified: Secondary | ICD-10-CM | POA: Diagnosis not present

## 2023-06-22 DIAGNOSIS — K2980 Duodenitis without bleeding: Secondary | ICD-10-CM | POA: Diagnosis not present

## 2023-07-24 ENCOUNTER — Other Ambulatory Visit: Payer: Self-pay | Admitting: Internal Medicine

## 2023-07-25 ENCOUNTER — Telehealth: Payer: Self-pay | Admitting: Internal Medicine

## 2023-07-25 MED ORDER — METOPROLOL TARTRATE 50 MG PO TABS
50.0000 mg | ORAL_TABLET | Freq: Two times a day (BID) | ORAL | 2 refills | Status: DC
Start: 2023-07-25 — End: 2024-04-30

## 2023-07-25 NOTE — Telephone Encounter (Signed)
*  STAT* If patient is at the pharmacy, call can be transferred to refill team.   1. Which medications need to be refilled? (please list name of each medication and dose if known)   metoprolol tartrate (LOPRESSOR) 50 MG tablet    2. Which pharmacy/location (including street and city if local pharmacy) is medication to be sent to? [facility] - [place], [place] - [address]    3. Do they need a 30 day or 90 day supply? 90 day

## 2023-07-25 NOTE — Telephone Encounter (Signed)
Requested Prescriptions   Signed Prescriptions Disp Refills   metoprolol tartrate (LOPRESSOR) 50 MG tablet 180 tablet 2    Sig: Take 1 tablet (50 mg total) by mouth 2 (two) times daily.    Authorizing Provider: Duke Salvia    Ordering User: Feliberto Harts L   last visit 05/02/23 with plan to follow up in 12 months next visit:  none/active recall

## 2023-10-13 ENCOUNTER — Other Ambulatory Visit: Payer: Self-pay | Admitting: Internal Medicine

## 2023-10-13 DIAGNOSIS — I480 Paroxysmal atrial fibrillation: Secondary | ICD-10-CM

## 2023-10-13 NOTE — Telephone Encounter (Signed)
Prescription refill request for Eliquis received. Indication:AFIB Last office visit:9/24 Scr:0.95  8/24 Age: 73 Weight:154.2  kg  Prescription refilled

## 2023-11-02 DIAGNOSIS — H2513 Age-related nuclear cataract, bilateral: Secondary | ICD-10-CM | POA: Diagnosis not present

## 2023-12-14 DIAGNOSIS — E559 Vitamin D deficiency, unspecified: Secondary | ICD-10-CM | POA: Diagnosis not present

## 2023-12-14 DIAGNOSIS — Z0000 Encounter for general adult medical examination without abnormal findings: Secondary | ICD-10-CM | POA: Diagnosis not present

## 2023-12-14 DIAGNOSIS — E039 Hypothyroidism, unspecified: Secondary | ICD-10-CM | POA: Diagnosis not present

## 2023-12-14 DIAGNOSIS — I10 Essential (primary) hypertension: Secondary | ICD-10-CM | POA: Diagnosis not present

## 2023-12-14 LAB — LAB REPORT - SCANNED: EGFR: 46

## 2023-12-21 DIAGNOSIS — E559 Vitamin D deficiency, unspecified: Secondary | ICD-10-CM | POA: Diagnosis not present

## 2023-12-21 DIAGNOSIS — Z7901 Long term (current) use of anticoagulants: Secondary | ICD-10-CM | POA: Diagnosis not present

## 2023-12-21 DIAGNOSIS — F325 Major depressive disorder, single episode, in full remission: Secondary | ICD-10-CM | POA: Diagnosis not present

## 2023-12-21 DIAGNOSIS — I10 Essential (primary) hypertension: Secondary | ICD-10-CM | POA: Diagnosis not present

## 2023-12-21 DIAGNOSIS — K219 Gastro-esophageal reflux disease without esophagitis: Secondary | ICD-10-CM | POA: Diagnosis not present

## 2023-12-21 DIAGNOSIS — E875 Hyperkalemia: Secondary | ICD-10-CM | POA: Diagnosis not present

## 2023-12-21 DIAGNOSIS — E039 Hypothyroidism, unspecified: Secondary | ICD-10-CM | POA: Diagnosis not present

## 2023-12-21 DIAGNOSIS — I480 Paroxysmal atrial fibrillation: Secondary | ICD-10-CM | POA: Diagnosis not present

## 2023-12-21 DIAGNOSIS — R748 Abnormal levels of other serum enzymes: Secondary | ICD-10-CM | POA: Diagnosis not present

## 2024-01-02 IMAGING — CR DG HIP (WITH OR WITHOUT PELVIS) 2-3V*L*
1 series · 3 of 3 positions shown · non-contrast
Comparison: None.

CLINICAL DATA: Fell.

EXAM:
DG HIP (WITH OR WITHOUT PELVIS) 2-3V LEFT

[Series 1: dg hip unilat w or w/o pelvis 2-3 views  · non-contrast · 0.14mm/px · 3 of 3 slices shown]
[im 1/3]
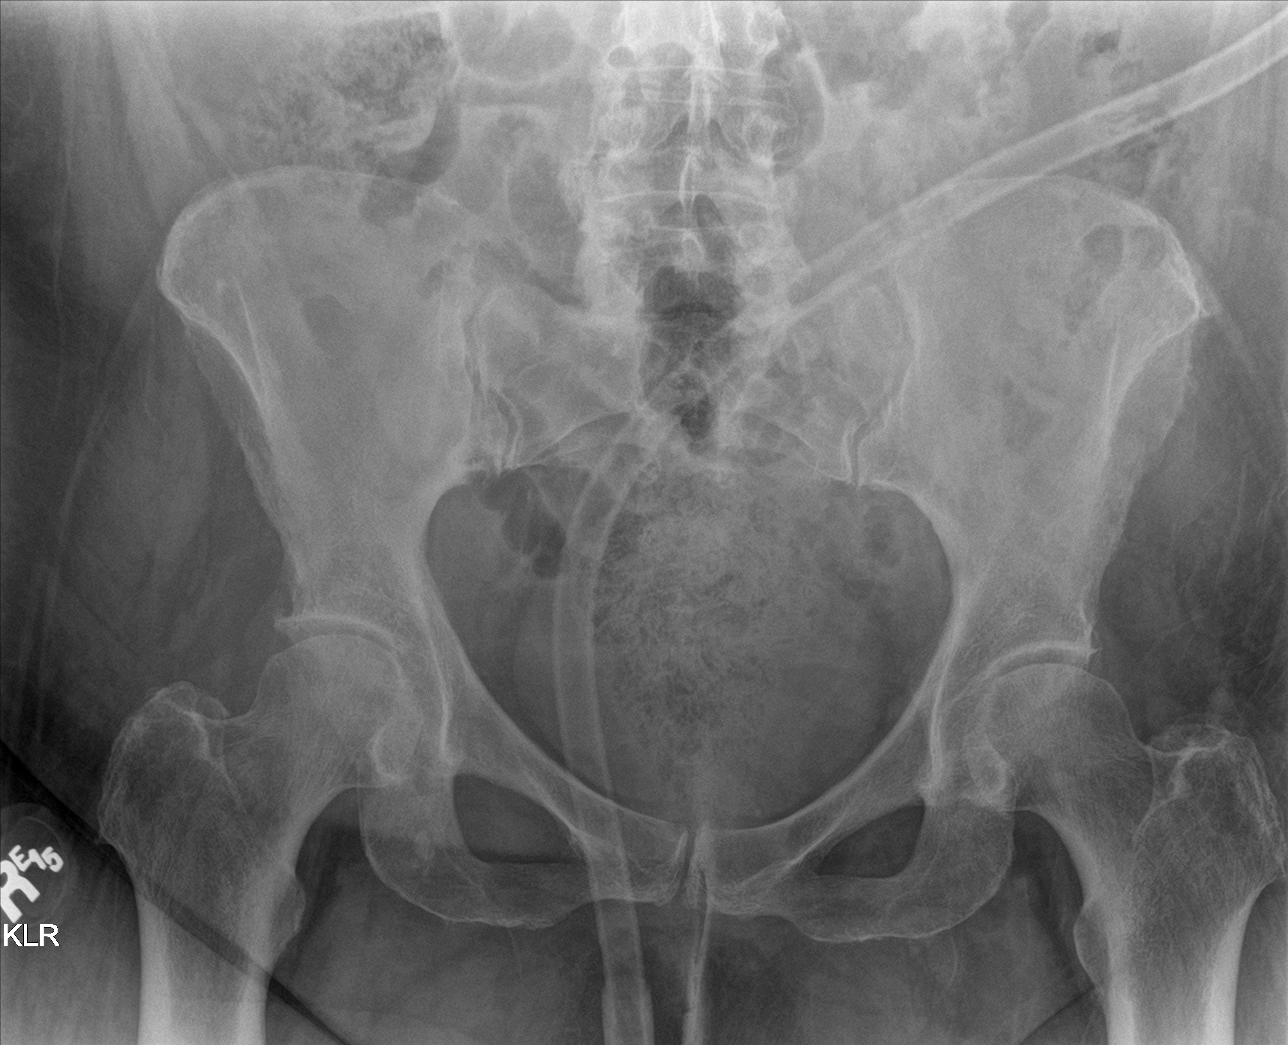
[im 2/3]
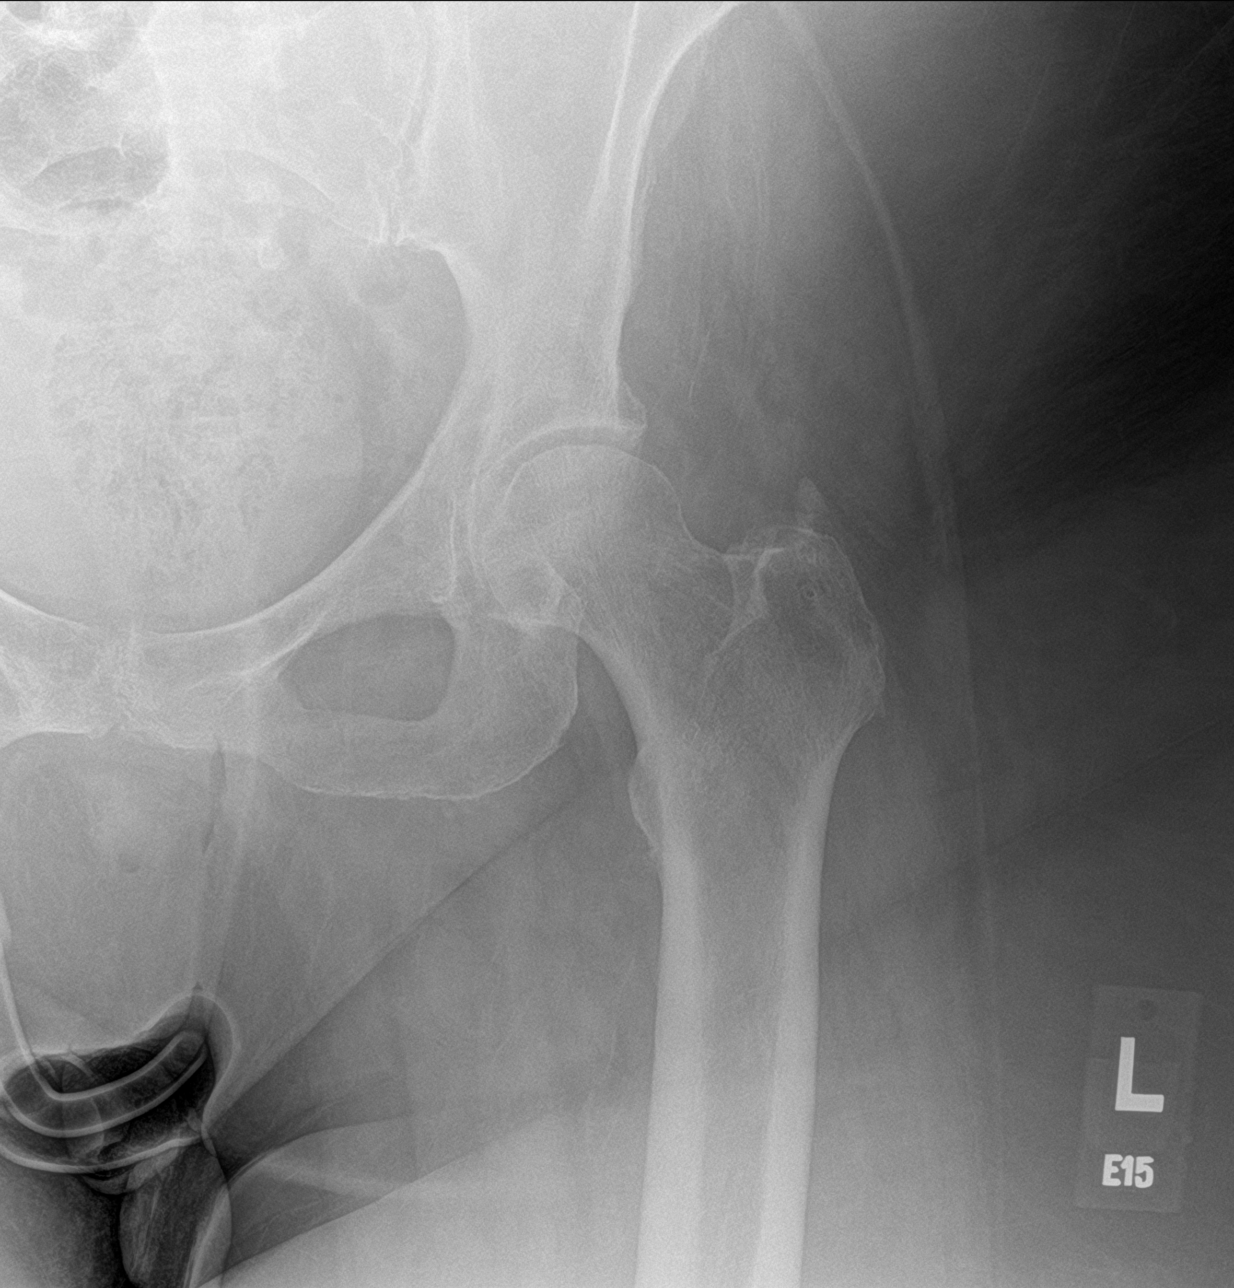
[im 3/3]
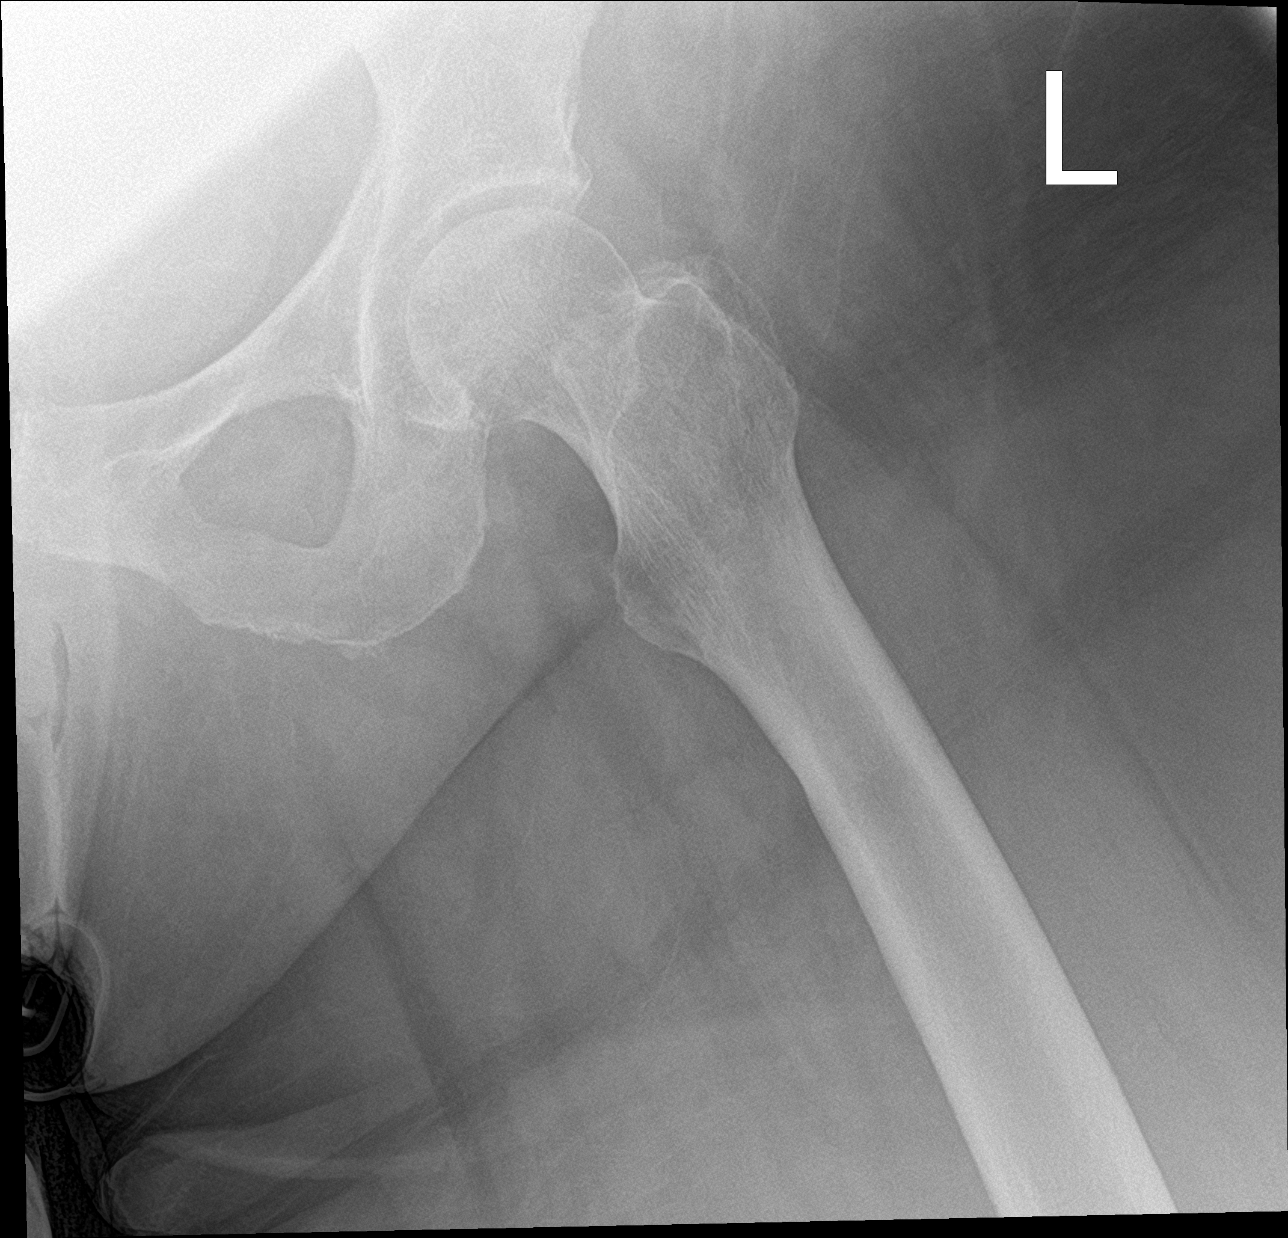

[3 of 3 positions shown; findings below may reference images not displayed]

FINDINGS: Both hips are normally located. No acute hip fracture. The pubic
symphysis and SI joints are intact. No pelvic fractures.
IMPRESSION: No acute bony findings.

## 2024-01-02 IMAGING — CT CT HEAD W/O CM
3 series · 16 of 37 positions shown, 18 images · non-contrast
Comparison: None.

CLINICAL DATA: Head trauma.  Weakness with a fall yesterday.



[Series 2: head bone · axial · 0.42mm/px · z∈[-487,-393]mm · 6 of 79 slices shown]
[im 8/79  bone]
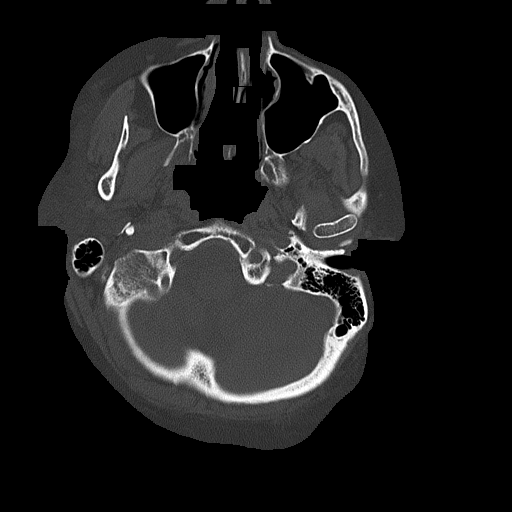
[im 16/79  bone]
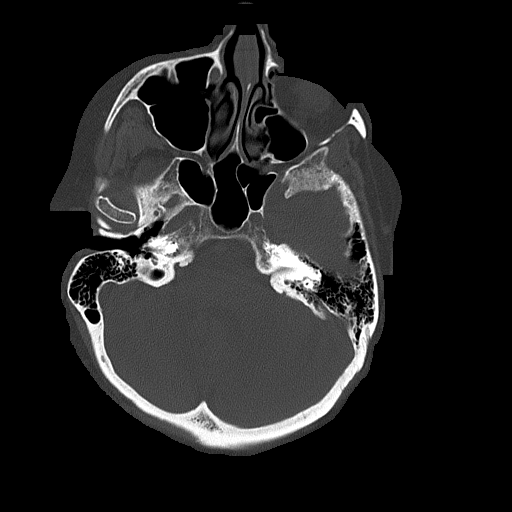
[im 24/79  bone]
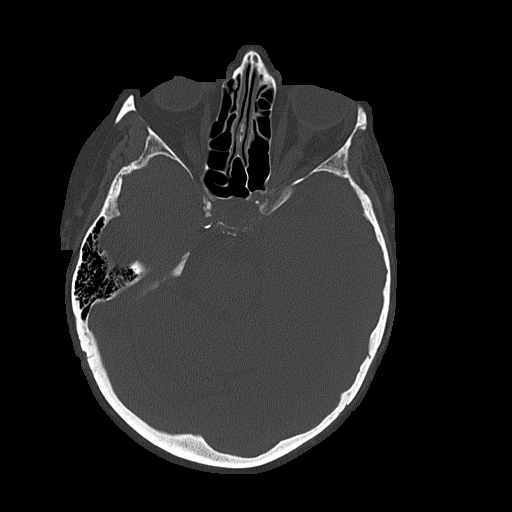
[im 36/79  bone]
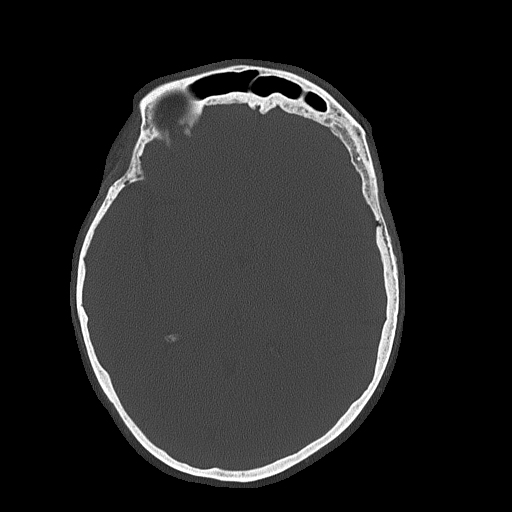
[im 43/79  bone]
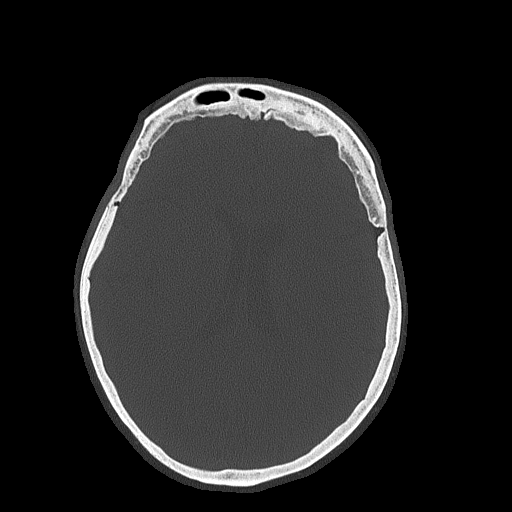
[im 55/79  bone]
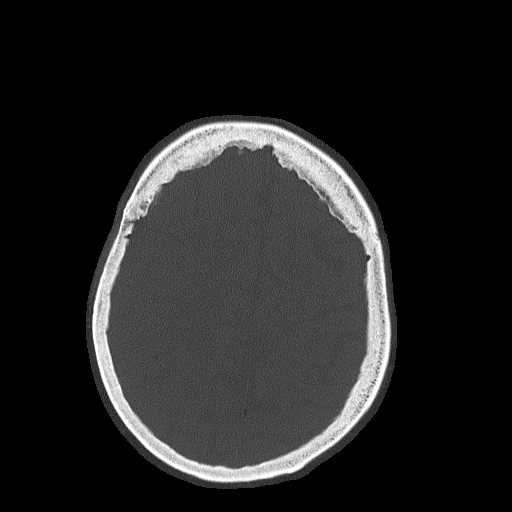

[Series 3: head wo · axial · 0.42mm/px · z∈[-486,-366]mm · 7 of 32 slices shown, 9 images]
[im 4/32  brain]
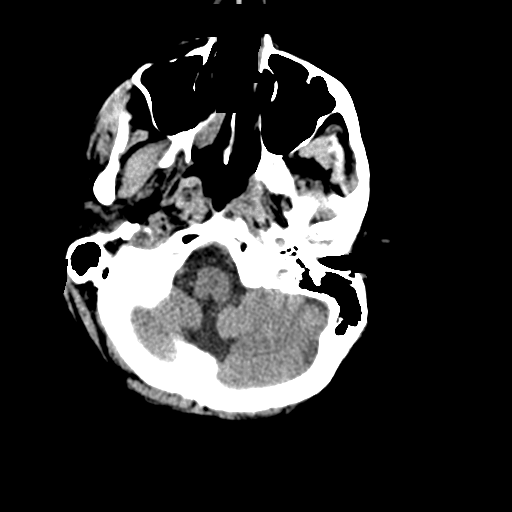
[im 4/32  bone]
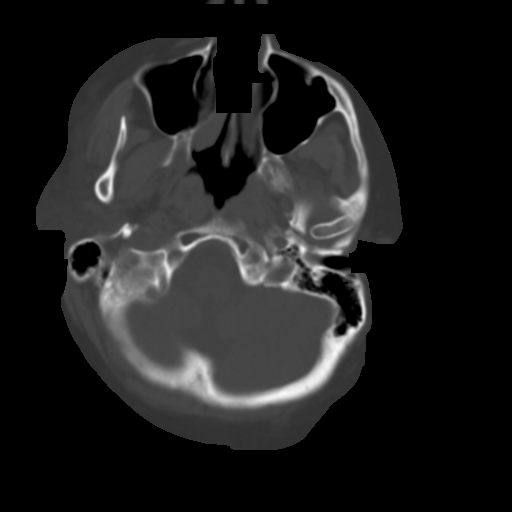
[im 8/32  brain]
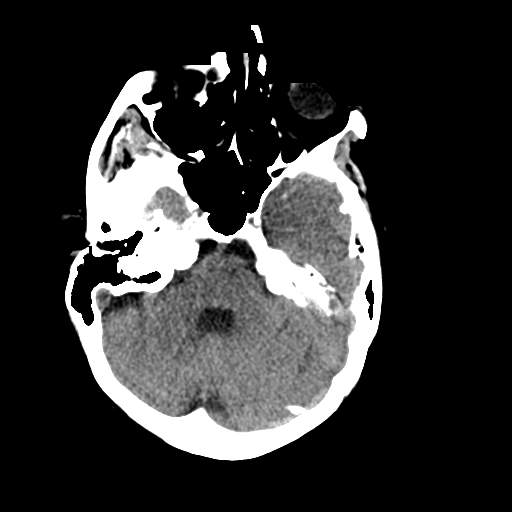
[im 12/32  brain]
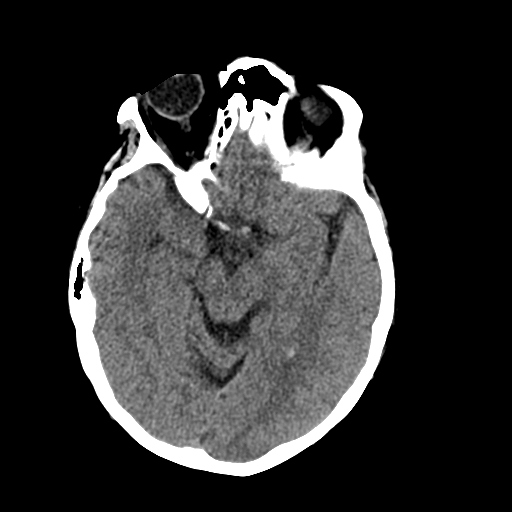
[im 16/32  brain]
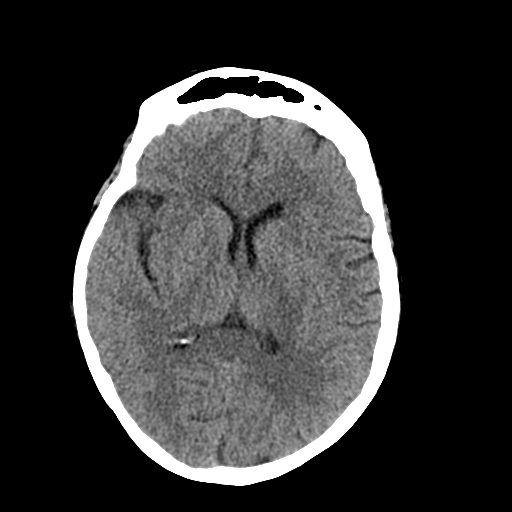
[im 20/32  brain]
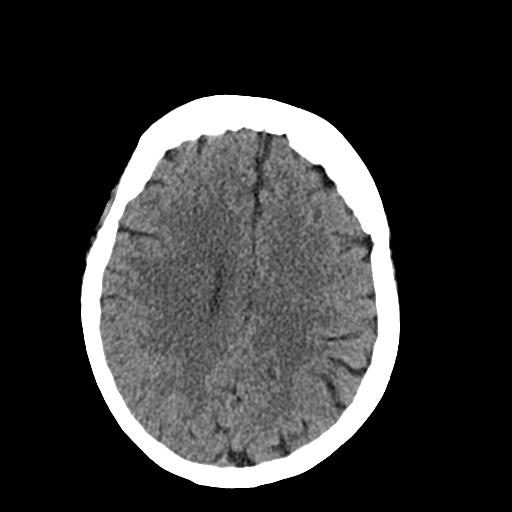
[im 20/32  bone]
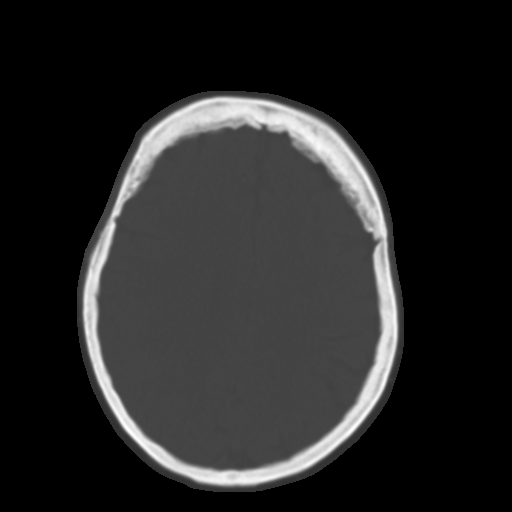
[im 24/32  brain]
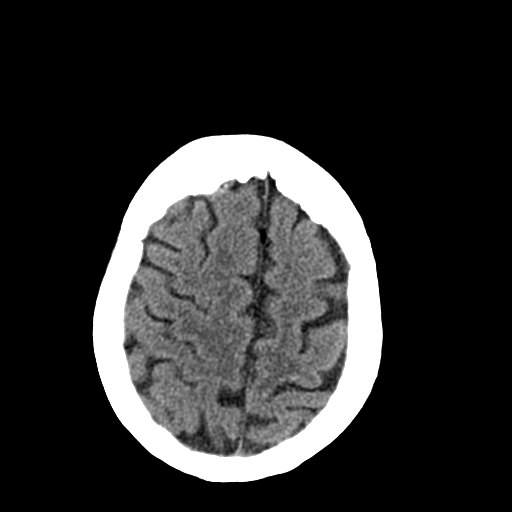
[im 28/32  brain]
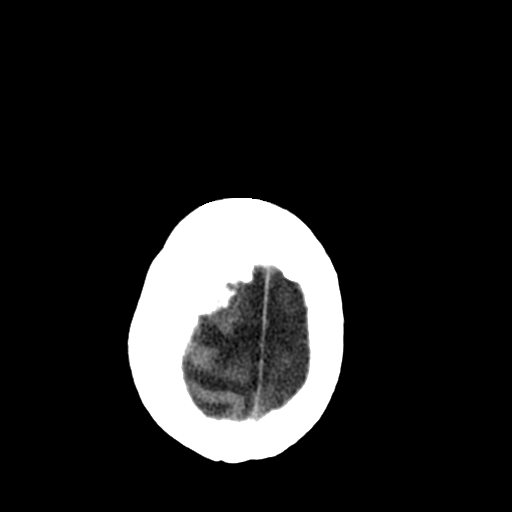

[Series 5: sagittal soft tissue · sagittal · 0.34mm/px · 3 of 54 slices shown]
[im 21/54  brain]
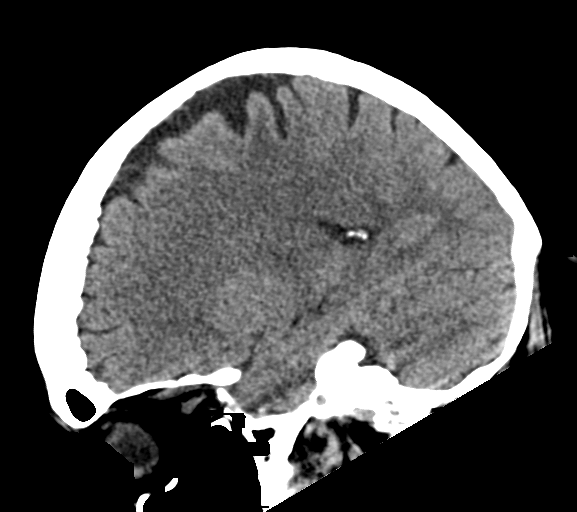
[im 27/54  brain]
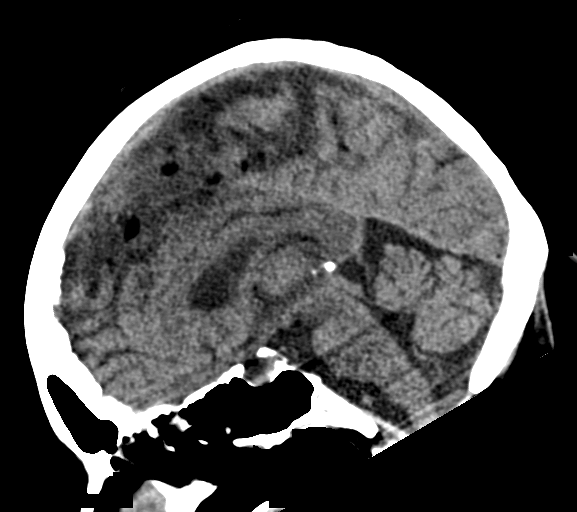
[im 34/54  brain]
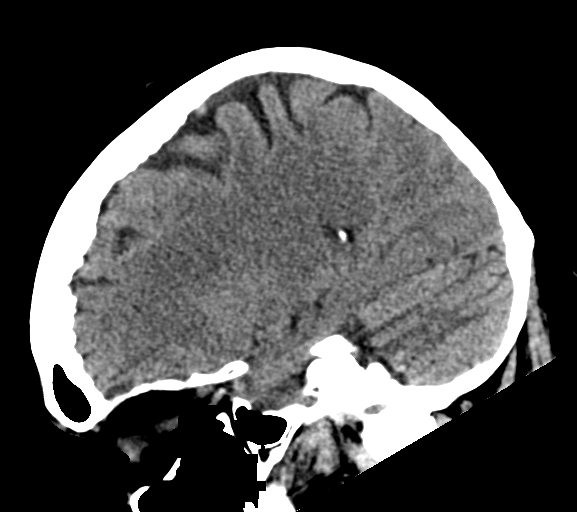

[16 of 37 positions shown; findings below may reference images not displayed]

FINDINGS: CT HEAD FINDINGS

Brain: There is no evidence of an acute infarct, intracranial
hemorrhage, mass, midline shift, or extra-axial fluid collection.
The ventricles and sulci are normal.

Vascular: Calcified atherosclerosis at the skull base. No hyperdense
vessel.

Skull: No acute fracture or suspicious osseous lesion.

Sinuses/Orbits: Visualized paranasal sinuses and mastoid air cells
are clear. Unremarkable orbits.

Other: None.

CT CERVICAL SPINE FINDINGS

Alignment: Mild cervical spine straightening.  No listhesis.

Skull base and vertebrae: No acute fracture or suspicious osseous
lesion.

Soft tissues and spinal canal: No prevertebral fluid or swelling. No
visible canal hematoma.

Disc levels: Moderate disc space narrowing and degenerative endplate
sclerosis and spurring at C3-4, C4-5, and C5-6. Mild-to-moderate
spinal stenosis and right neural foraminal stenosis at C4-5.

Upper chest: Clear lung apices.

Other: None.
IMPRESSION: 1. No evidence of acute intracranial abnormality.
2. No evidence of acute fracture or subluxation in the cervical
spine.

## 2024-01-03 IMAGING — CT CT ABD-PELV W/ CM
2 of 5 series · 16 of 46 positions shown, 18 images · IV contrast (APPLIED)
Comparison: 12/08/2017

CLINICAL DATA: Anemia, suspected retroperitoneal bleed/hematoma

EXAM:
CT ABDOMEN AND PELVIS WITH CONTRAST
TECHNIQUE: Multidetector CT imaging of the abdomen and pelvis was performed
using the standard protocol following bolus administration of
intravenous contrast.

[Series 2: abdomen 5.0 · axial · 0.82mm/px · z∈[-1392,-982]mm · 13 of 96 slices shown, 15 images]
[im 7/96  soft-tissue]
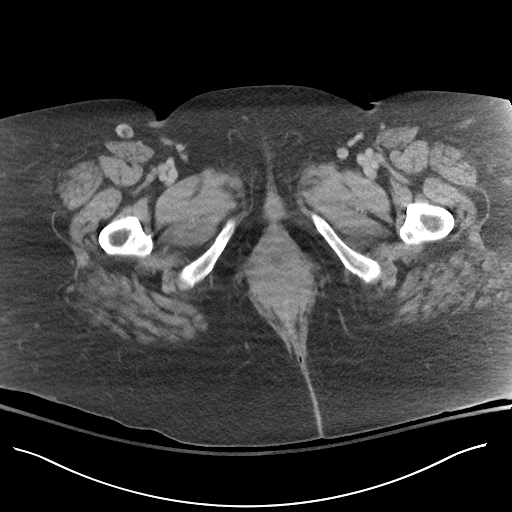
[im 7/96  bone]
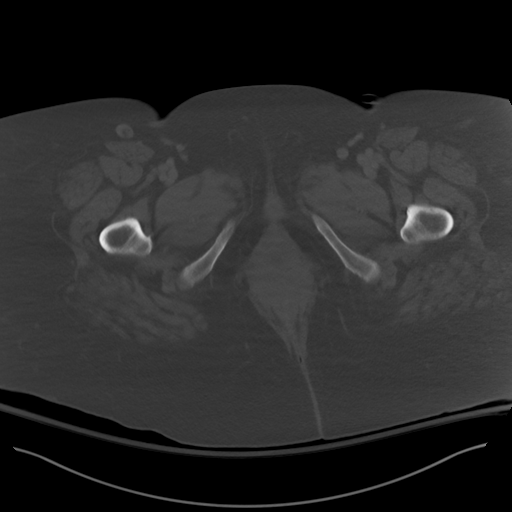
[im 13/96  soft-tissue]
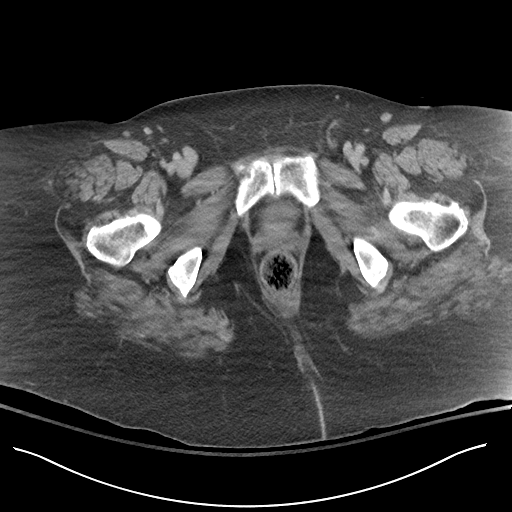
[im 20/96  soft-tissue]
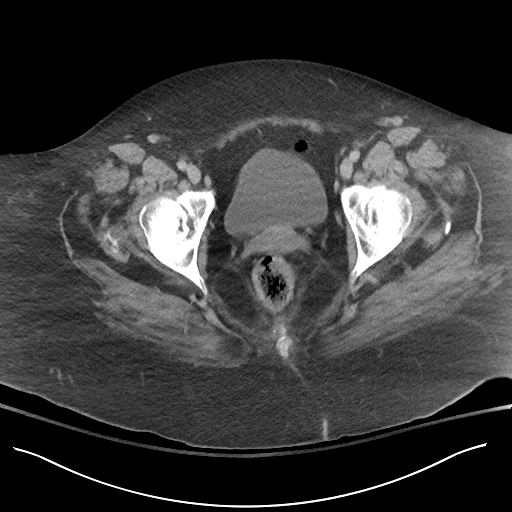
[im 26/96  soft-tissue]
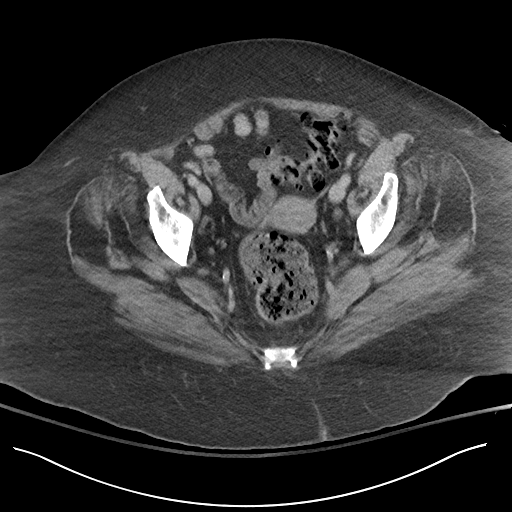
[im 32/96  soft-tissue]
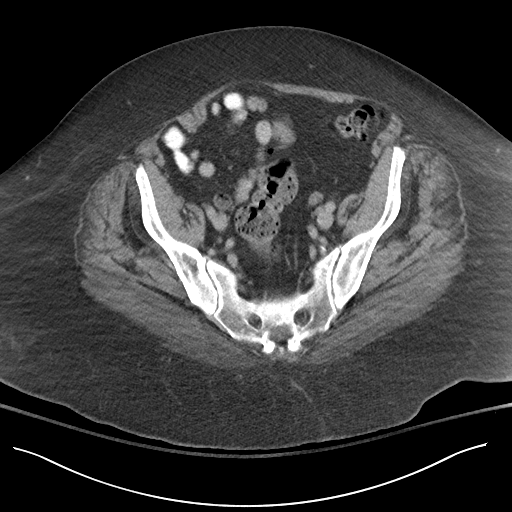
[im 39/96  soft-tissue]
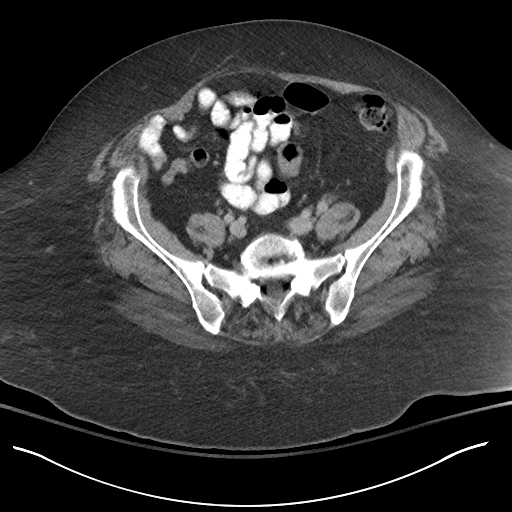
[im 51/96  soft-tissue]
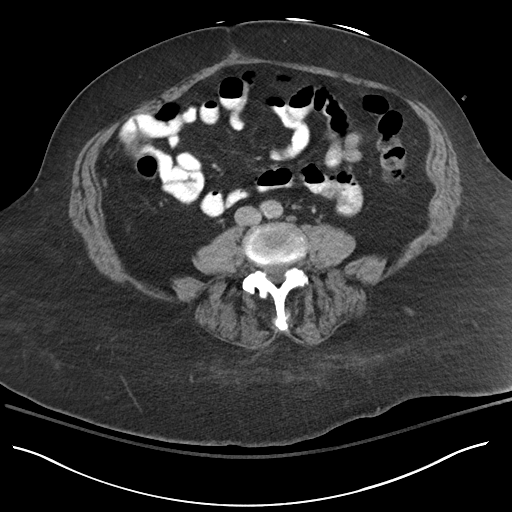
[im 58/96  soft-tissue]
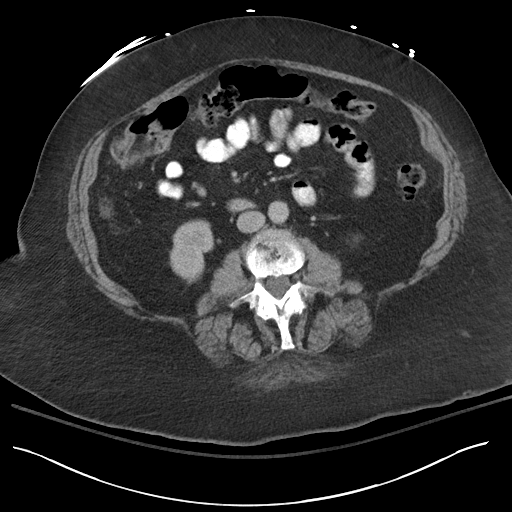
[im 64/96  soft-tissue]
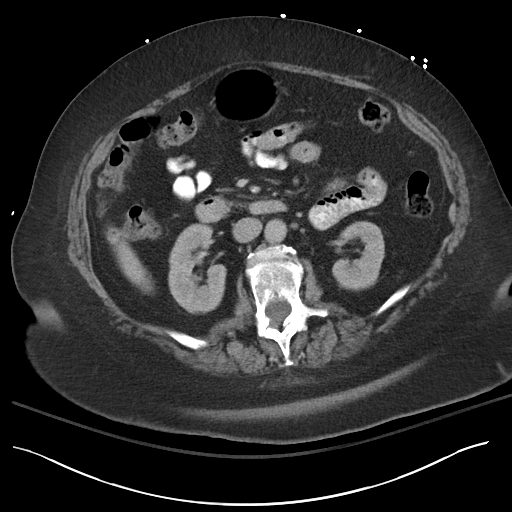
[im 64/96  bone]
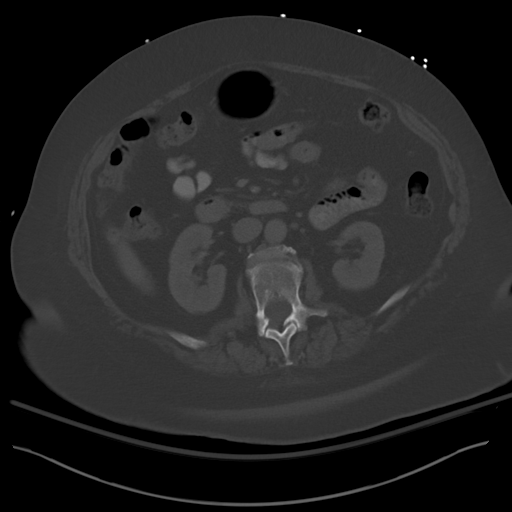
[im 70/96  soft-tissue]
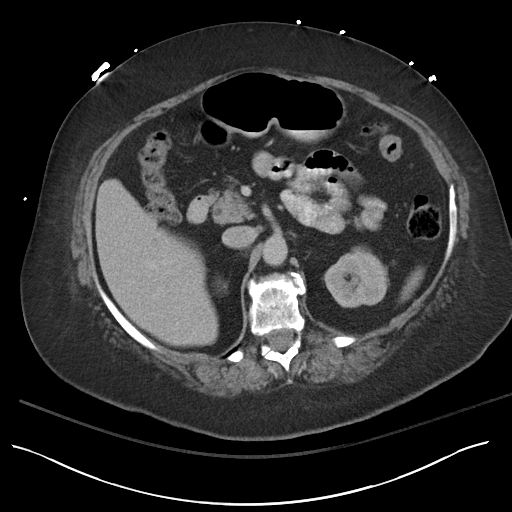
[im 77/96  soft-tissue]
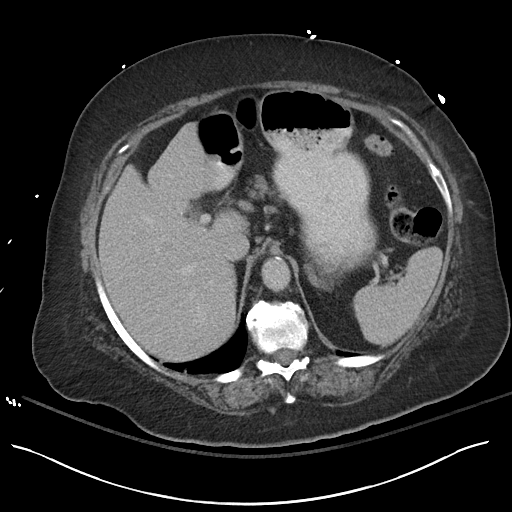
[im 83/96  soft-tissue]
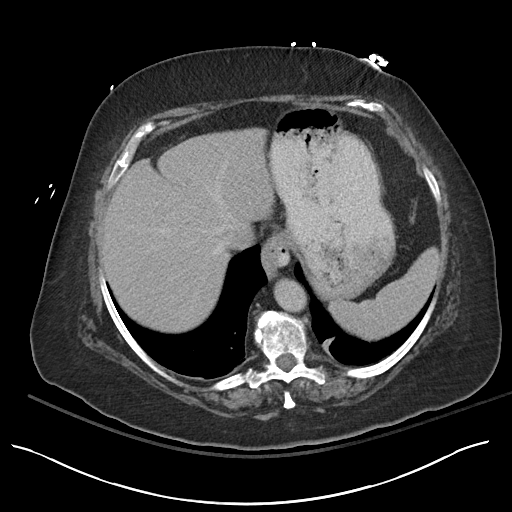
[im 89/96  soft-tissue]
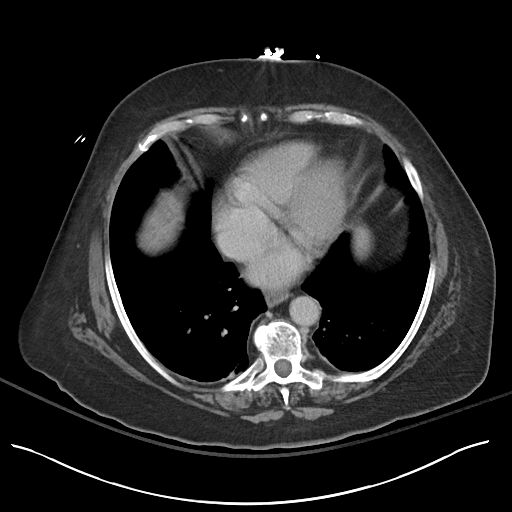

[Series 5: abdomen 3.0 mpr cor · coronal · 0.80mm/px · 3 of 122 slices shown]
[im 41/122  soft-tissue]
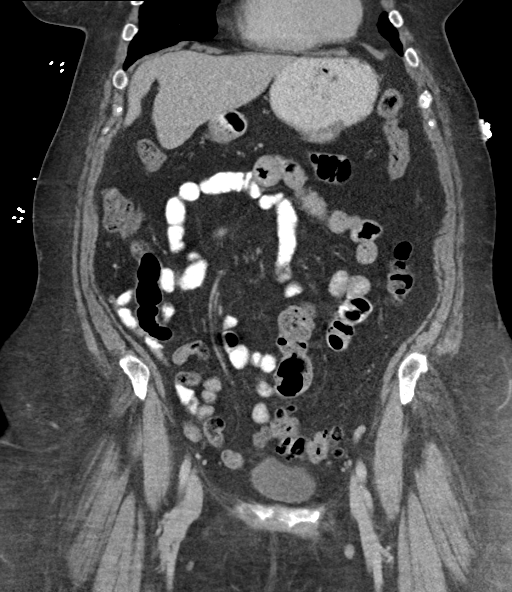
[im 54/122  soft-tissue]
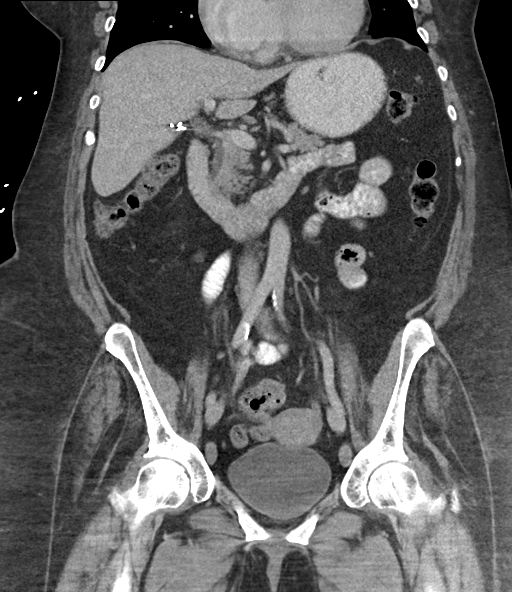
[im 68/122  soft-tissue]
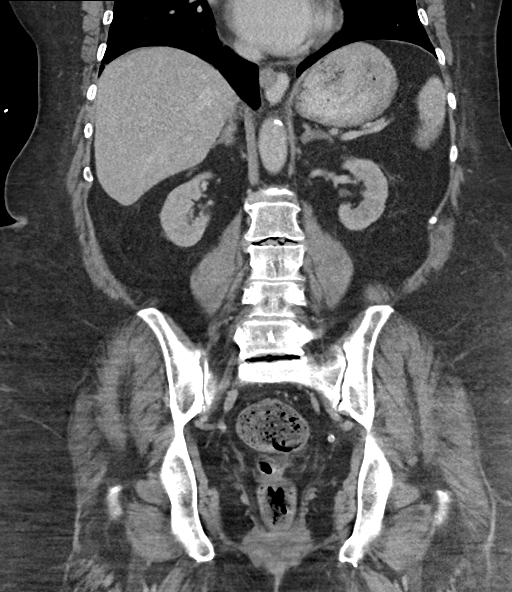

[16 of 46 positions shown; findings below may reference images not displayed]

RADIATION DOSE REDUCTION: This exam was performed according to the
departmental dose-optimization program which includes automated
exposure control, adjustment of the mA and/or kV according to
patient size and/or use of iterative reconstruction technique.

CONTRAST:  80mL OMNIPAQUE IOHEXOL 350 MG/ML SOLN IV. No oral
contrast.
FINDINGS: Lower chest: Mild bibasilar atelectasis

Hepatobiliary: Gallbladder surgically absent. Liver normal
appearance

Pancreas: Normal appearance

Spleen: Normal appearance

Adrenals/Urinary Tract: Adrenal glands, kidneys, ureters, and
bladder normal appearance

Stomach/Bowel: Normal appendix. Stool in rectum. Scattered colonic
diverticulosis without evidence of diverticulitis. Tiny hiatal
hernia. Stomach and bowel loops otherwise normal appearance.

Vascular/Lymphatic: Atherosclerotic calcification aorta and iliac
arteries without aneurysm. No adenopathy.

Reproductive: Unremarkable uterus and ovaries

Other: No free air or free fluid. No hernia or inflammatory process.
No retroperitoneal hemorrhage or infiltration.

Musculoskeletal: Diffuse osseous demineralization. Scattered
degenerative disc disease changes of thoracolumbar spine.
IMPRESSION: Colonic diverticulosis without evidence of diverticulitis.

Tiny hiatal hernia.

No acute intra-abdominal or intrapelvic abnormalities.

Specifically, no evidence of retroperitoneal hemorrhage.

Aortic Atherosclerosis ([id number]-[id number].T).

## 2024-01-03 IMAGING — DX DG CHEST 1V PORT
1 series · 1 of 1 positions shown · non-contrast
Comparison: Chest radiograph dated 09/08/2021.

CLINICAL DATA: Leukocytosis.

EXAM:
PORTABLE CHEST 1 VIEW

[chest ap]
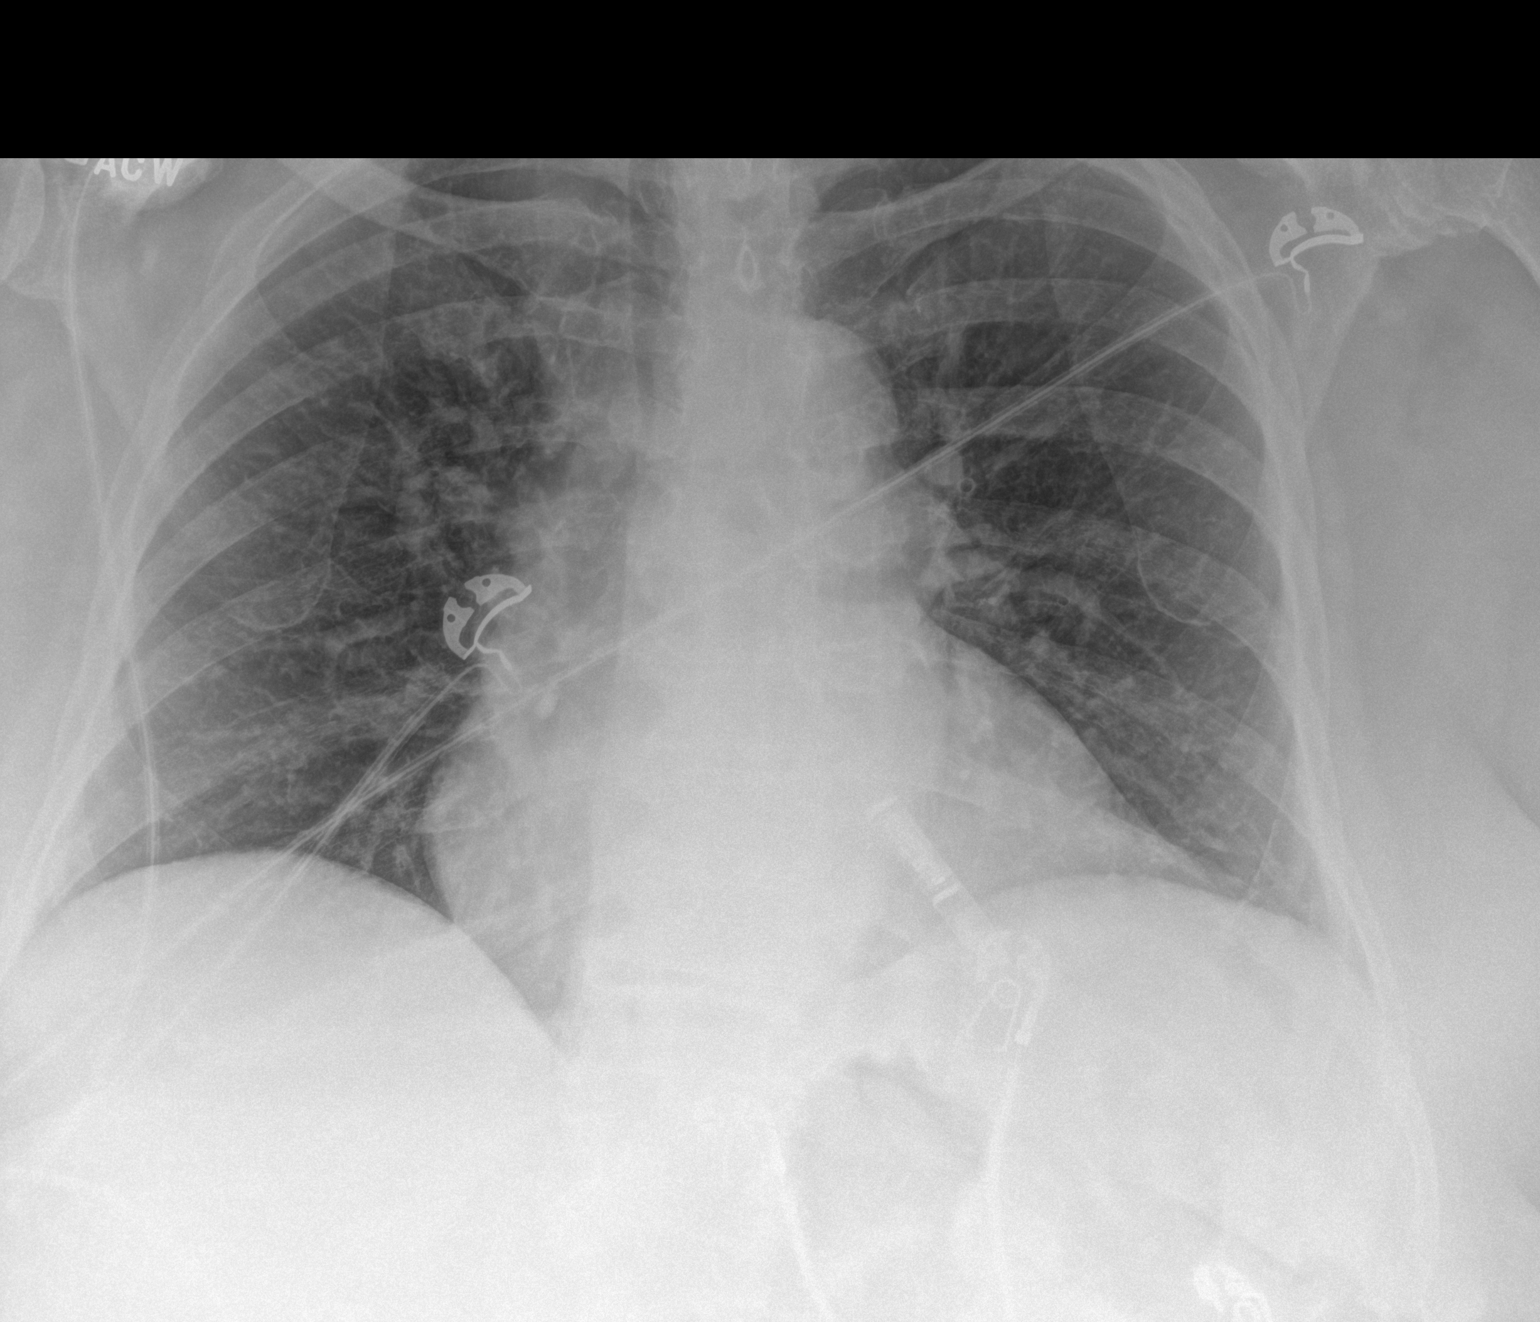

[1 of 1 positions shown; findings below may reference images not displayed]

FINDINGS: No focal consolidation, pleural effusion, pneumothorax. Top-normal
cardiac size. Difficult device. No acute osseous pathology.
IMPRESSION: No acute cardiopulmonary process.

## 2024-01-03 IMAGING — US US EXTREM LOW*L* LIMITED
1 series · 14 of 18 positions shown · non-contrast
Comparison: None.

CLINICAL DATA: Acute blood loss.  Anemia.  Evaluate thigh hematoma.

EXAM:
ULTRASOUND left LOWER EXTREMITY LIMITED
TECHNIQUE: Ultrasound examination of the lower extremity soft tissues was
performed in the area of clinical concern.

[Series 1: us left lower extrem ltd soft tissue non vascular · 18 acquisitions, 14 frames shown]
[im 1/18]
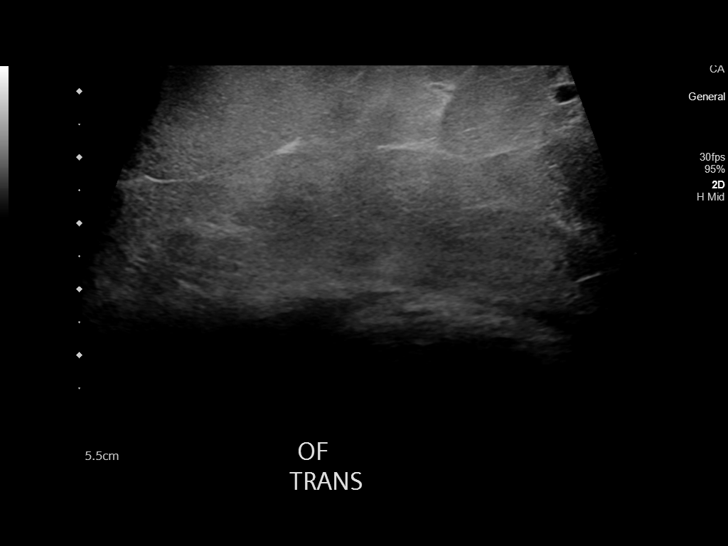
[im 2/18]
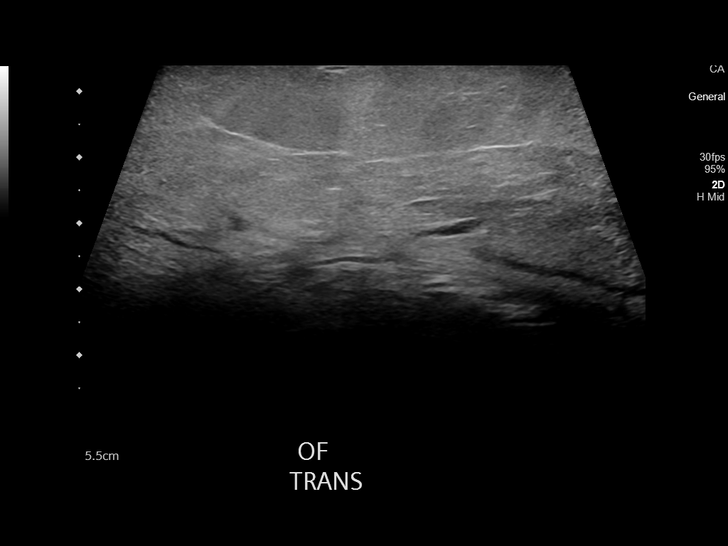
[im 4/18]
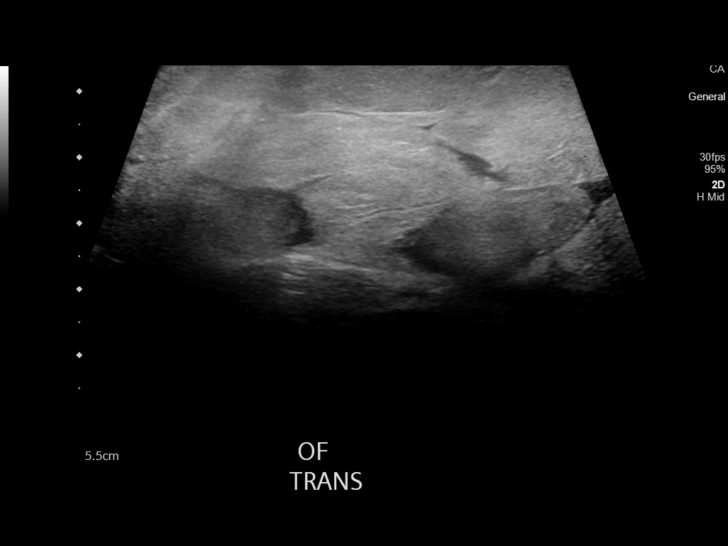
[im 5/18]
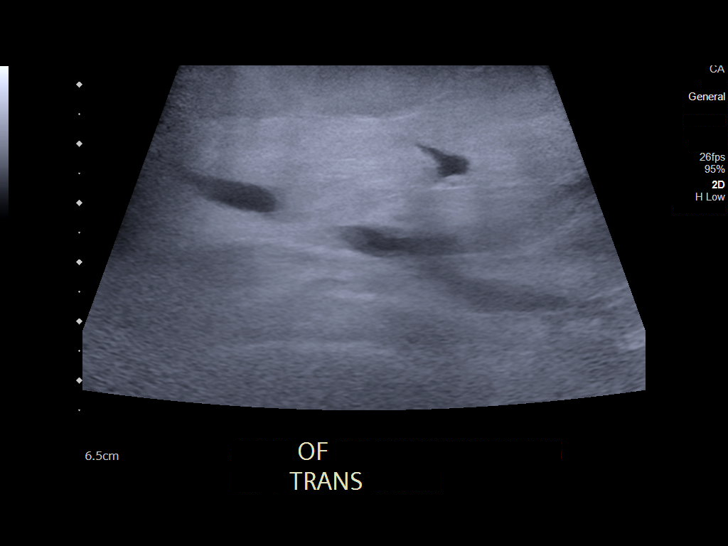
[im 6/18]
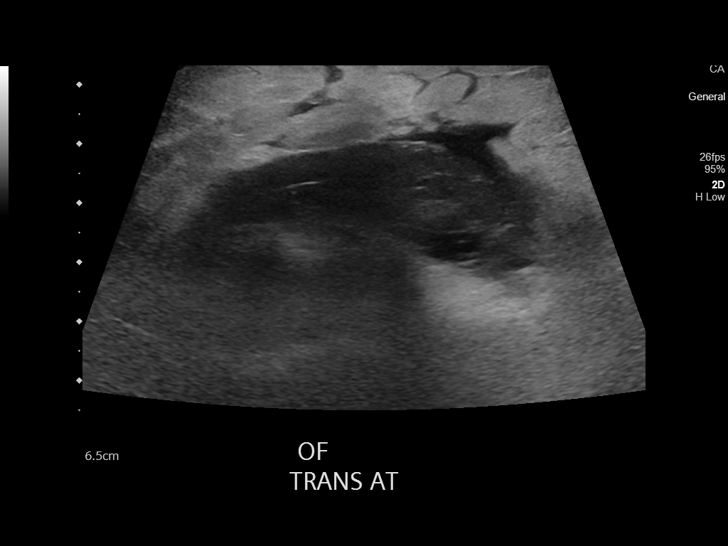
[im 8/18]
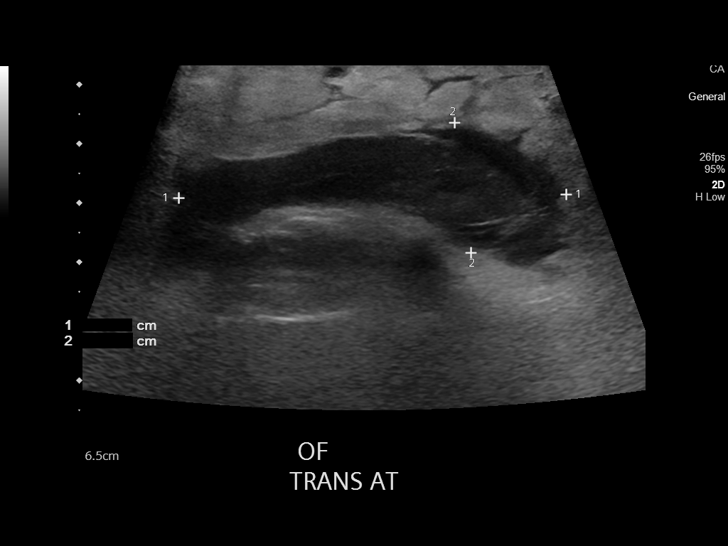
[im 9/18]
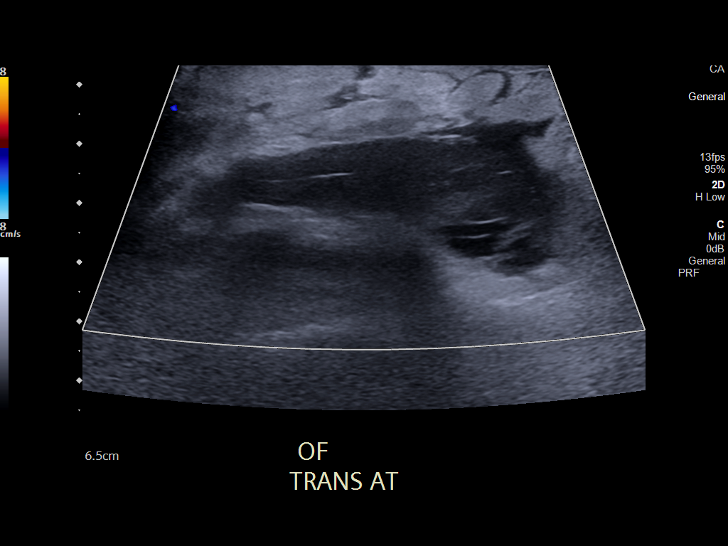
[im 10/18]
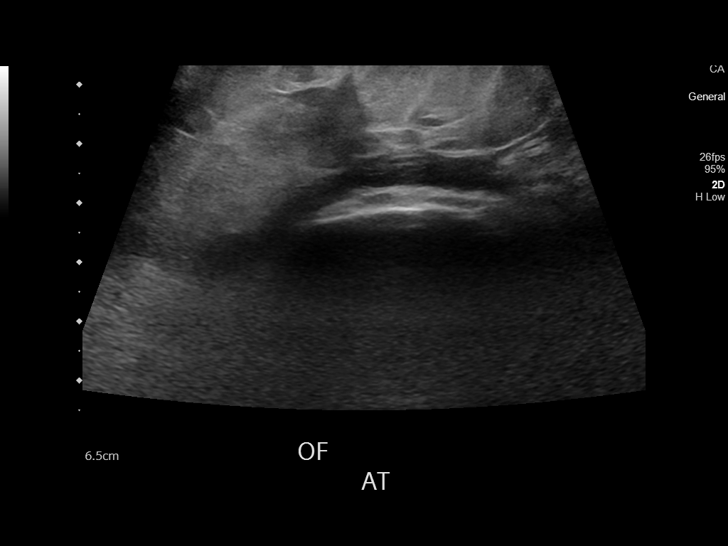
[im 11/18]
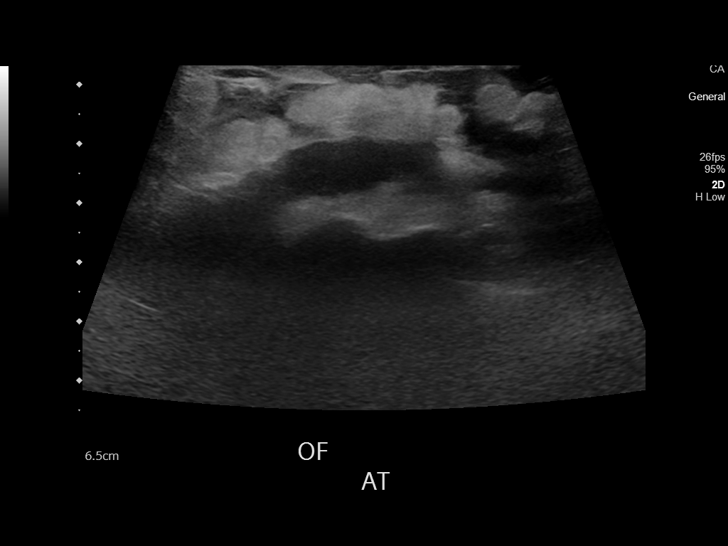
[im 13/18]
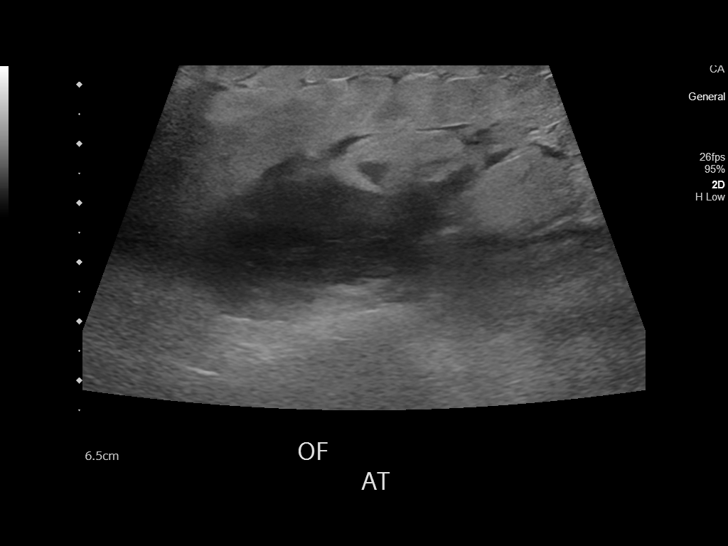
[im 14/18]
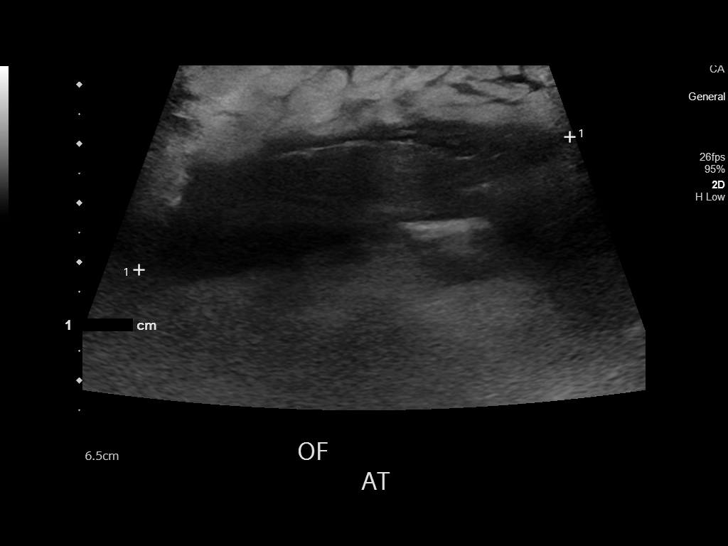
[im 15/18]
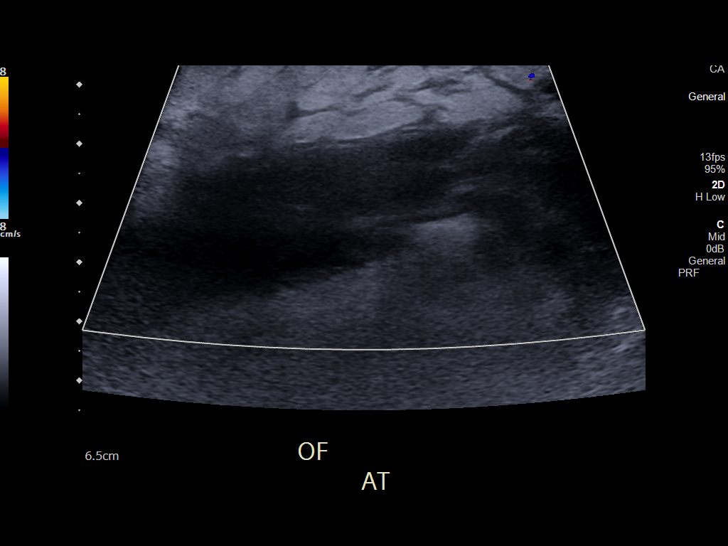
[im 17/18]
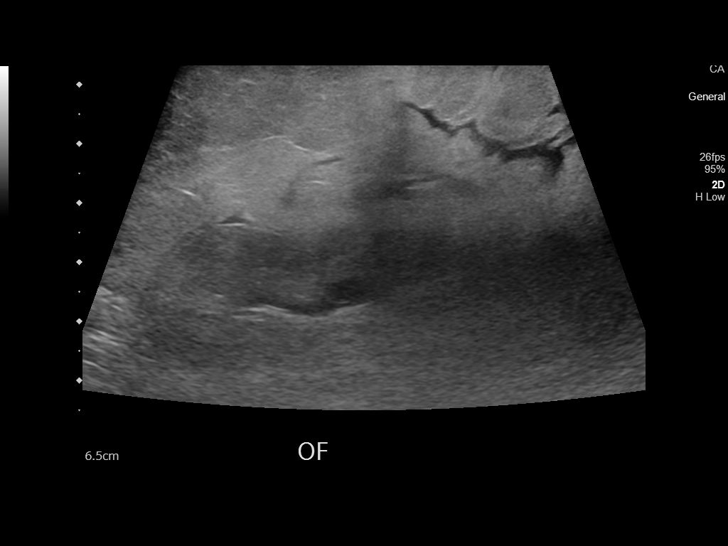
[im 18/18]
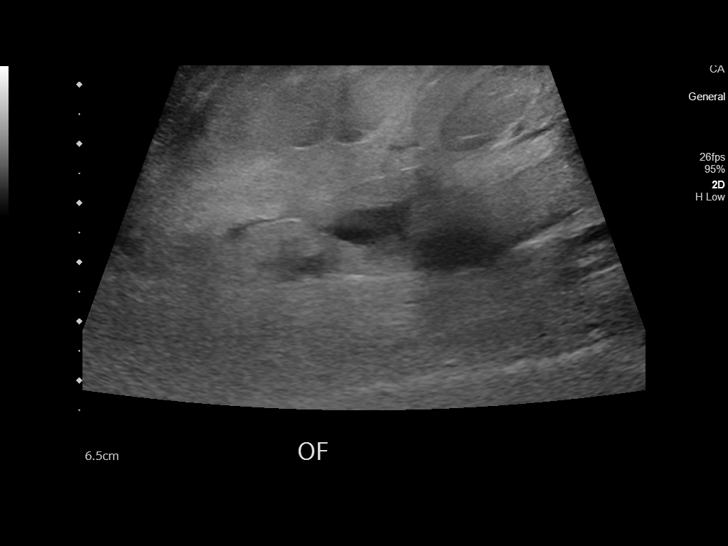

[14 of 18 positions shown; findings below may reference images not displayed]

FINDINGS: Scanning of the left thigh. There is diffuse soft tissue edema.
Anterior to the knee, there is a complex hypoechoic fluid collection
measuring 7.6 x 2.2 x 6.6 cm. This most compatible with soft tissue
hematoma anterior to the knee. Prior knee replacement noted on
x-ray. No hypervascularity on Doppler.
IMPRESSION: Complex fluid collection anterior to the knee compatible with soft
tissue hematoma.

## 2024-04-29 ENCOUNTER — Other Ambulatory Visit: Payer: Self-pay | Admitting: Internal Medicine

## 2024-04-30 ENCOUNTER — Telehealth: Payer: Self-pay | Admitting: Internal Medicine

## 2024-04-30 MED ORDER — METOPROLOL TARTRATE 50 MG PO TABS: 50.0000 mg | ORAL_TABLET | Freq: Two times a day (BID) | ORAL | 0 refills | Status: DC

## 2024-04-30 MED ORDER — FLECAINIDE ACETATE 100 MG PO TABS
100.0000 mg | ORAL_TABLET | Freq: Two times a day (BID) | ORAL | 0 refills | Status: DC
Start: 2024-04-30 — End: 2024-05-21

## 2024-04-30 NOTE — Telephone Encounter (Signed)
 RX sent in

## 2024-04-30 NOTE — Telephone Encounter (Signed)
*  STAT* If patient is at the pharmacy, call can be transferred to refill team.   1. Which medications need to be refilled? (please list name of each medication and dose if known)   flecainide  (TAMBOCOR ) 100 MG tablet    metoprolol  tartrate (LOPRESSOR ) 50 MG tablet    4. Which pharmacy/location (including street and city if local pharmacy) is medication to be sent to?  [facility] - , [place] - [address]     5. Do they need a 30 day or 90 day supply? 90    05/21/24 next appt

## 2024-05-06 ENCOUNTER — Other Ambulatory Visit: Payer: Self-pay | Admitting: Internal Medicine

## 2024-05-06 DIAGNOSIS — I480 Paroxysmal atrial fibrillation: Secondary | ICD-10-CM

## 2024-05-06 NOTE — Telephone Encounter (Signed)
 Prescription refill request for Eliquis  received. Indication: PAF Last office visit: 05/02/23  GORMAN Sage MD Scr: 1.23 on 12/14/23  Epic Age: 73 Weight: 154.2kg  Based on above findings Eliquis  5mg  twice daily is the appropriate dose.  Refill approved.

## 2024-05-20 NOTE — Progress Notes (Unsigned)
 Electrophysiology Clinic Note    Date:  05/21/2024  Patient ID:  [name], DOB [birth year], MRN [id number] PCP:  Clarice Nottingham, MD  Cardiologist:  None  Electrophysiology APP:  Betsi Crespi, NP     Discussed the use of AI scribe software for clinical note transcription with the patient, who gave verbal consent to proceed.   Patient Profile    Chief Complaint: AFib follow-up  History of Present Illness: [name] is a 73 y.o. female with PMH notable for splenic infarct s/p ILR > persis Afib, HFpEF, HTN, CKD-3; seen today for None for routine electrophysiology followup.   She was last seen by Dr. Fernande 04/2023 where her AFib was quiescent on flecainide  + metop.   On follow-up today, she is doing well and not aware of any significant AF episodes. She has had brief palpitation, described as flickers lasting no more than a few minutes. She continues to take eliquis  BID along with flecainide  BID and lopressor  BID. She did not take any medications this AM except thyroid  medication. She has a history of white-coat HTN. She does not check BP regularly.   No bleeding concerns on eliquis .  She recently went out of town to beach with family, and noticed significant lower extremity edema and increased lasix  to 40mg  BID x 3 days, yesterday was final day of increase dose. Her legs are much softer and back to baseline.   She questions whether weight-loss injectable is safe to use, PCP has offered.    Arrhythmia/Device History Medtronic - ILR - Carelink (RRT 09/21/21)    AAD - Flecainide     ROS:  Please see the history of present illness. All other systems are reviewed and otherwise negative.    Physical Exam    VS:  BP (!) 183/102 (BP Location: Right Wrist, Cuff Size: Normal) Comment: recheck on wrist  Pulse (!) 56   Ht 5' 7 (1.702 m)   Wt (!) 375 lb (170.1 kg)   SpO2 95%   BMI 58.73 kg/m  BMI: Body mass index is 58.73 kg/m.           Wt Readings  from Last 3 Encounters:  05/21/24 (!) 375 lb (170.1 kg)  05/02/23 (!) 340 lb (154.2 kg)  04/20/23 (!) 330 lb 11 oz (150 kg)     GEN- The patient is well appearing, alert and oriented x 3 today.   Lungs- Clear to ausculation bilaterally, normal work of breathing.  Heart- Regular rate and rhythm, no murmurs, rubs or gallops Extremities- Trace peripheral edema, warm, dry; very difficult to assess edema d/t body habitus   Studies Reviewed   Previous EP, cardiology notes.    EKG is ordered. Personal review of EKG from today shows:    EKG Interpretation Date/Time:  Tuesday May 21 2024 11:14:19 EDT Ventricular Rate:  55 PR Interval:  200 QRS Duration:  114 QT Interval:  464 QTC Calculation: 443 R Axis:   -39  Text Interpretation: Sinus bradycardia Left axis deviation Moderate voltage criteria for LVH, may be normal variant ( R in aVL , Cornell product ) Confirmed by Newell Wafer [phone]) on 05/21/2024 11:22:45 AM    05/02/2023 EKG - SB w 1st deg HB, LAD, rate 53. PR 212, QTC 422  TTE, 12/08/2017 - Left ventricle: The cavity size was normal. Wall thickness was    normal. Systolic function was vigorous. The estimated ejection    fraction was in the range of 65% to 70%. Wall  motion was normal;    there were no regional wall motion abnormalities. Left    ventricular diastolic function parameters were normal.  - Pulmonary arteries: Systolic pressure was mildly increased. PA    peak pressure: 45 mm Hg (S).     Assessment and Plan     #) persis AFib #) flecainide  monitoring No signfiicant AF burden EKG with stable intervals on 100mg  flecainide  BID + 50mg  lopressor  BID Update BMP, mag today  #) Hypercoag d/t afib CHA2DS2-VASc Score = at least 6 [CHF History: 0, HTN History: 1, Diabetes History: 0, Stroke History: 2, Vascular Disease History: 0, Age Score: 2, Gender Score: 1].  Therefore, the patient's annual risk of stroke is 9.7 %.    Stroke ppx - 5mg  eliquis  BID,  appropriately dosed No bleeding concerns Update CBC   #) lower extremity edema Increased edema iso vacation with dietary indescriptions recently increased lasix  to 40mg  BID x 3 days, now resumed 40mg  daily Update Bmp as above  #) HTN Significantly elevated in office today, she has not taken any AM medications Recommend she take meds when she returns home Recommended she check BP 2-3 times per week and record measurements, and bring BP log to PCP appts to ensure adequate BP mgmt      Current medicines are reviewed at length with the patient today.   The patient does not have concerns regarding her medicines.  The following changes were made today:  none  Labs/ tests ordered today include:  Orders Placed This Encounter  Procedures   Basic metabolic panel with GFR   CBC   Magnesium   EKG 12-Lead     Disposition: Follow up with Dr. Kennyth or EP APP in 6 months   Signed, Onesha Krebbs, NP  05/21/24  3:18 PM  Electrophysiology [facility]

## 2024-05-21 ENCOUNTER — Ambulatory Visit: Attending: Cardiology | Admitting: Cardiology

## 2024-05-21 VITALS — BP 183/102 | HR 56 | Ht 67.0 in | Wt 375.0 lb

## 2024-05-21 DIAGNOSIS — Z79899 Other long term (current) drug therapy: Secondary | ICD-10-CM | POA: Diagnosis not present

## 2024-05-21 DIAGNOSIS — I4819 Other persistent atrial fibrillation: Secondary | ICD-10-CM

## 2024-05-21 DIAGNOSIS — D6869 Other thrombophilia: Secondary | ICD-10-CM

## 2024-05-21 DIAGNOSIS — Z5181 Encounter for therapeutic drug level monitoring: Secondary | ICD-10-CM

## 2024-05-21 MED ORDER — FLECAINIDE ACETATE 100 MG PO TABS: 100.0000 mg | ORAL_TABLET | Freq: Two times a day (BID) | ORAL | 3 refills | Status: AC

## 2024-05-21 MED ORDER — METOPROLOL TARTRATE 50 MG PO TABS: 50.0000 mg | ORAL_TABLET | Freq: Two times a day (BID) | ORAL | 3 refills | Status: AC

## 2024-05-21 NOTE — Patient Instructions (Signed)
 Medication Instructions:  Your physician recommends that you continue on your current medications as directed. Please refer to the Current Medication list given to you today.   *If you need a refill on your cardiac medications before your next appointment, please call your pharmacy*  Lab Work: Your provider would like for you to have following labs drawn today BMP, Magnesium level, and CBC.   If you have labs (blood work) drawn today and your tests are completely normal, you will receive your results only by: MyChart Message (if you have MyChart) OR A paper copy in the mail If you have any lab test that is abnormal or we need to change your treatment, we will call you to review the results.  Follow-Up: At [facility], you and your health needs are our priority.  As part of our continuing mission to provide you with exceptional heart care, our providers are all part of one team.  This team includes your primary Cardiologist (physician) and Advanced Practice Providers or APPs (Physician Assistants and Nurse Practitioners) who all work together to provide you with the care you need, when you need it.  Your next appointment:   6 month(s)  Provider:   Ole Holts, MD or Suzann Riddle, NP

## 2024-05-22 ENCOUNTER — Ambulatory Visit: Payer: Self-pay | Admitting: Cardiology

## 2024-05-22 LAB — BASIC METABOLIC PANEL WITH GFR
BUN/Creatinine Ratio: 26 (ref 12–28)
BUN: 24 mg/dL (ref 8–27)
CO2: 25 mmol/L (ref 20–29)
Calcium: 9 mg/dL (ref 8.7–10.3)
Chloride: 98 mmol/L (ref 96–106)
Creatinine, Ser: 0.94 mg/dL (ref 0.57–1.00)
Glucose: 80 mg/dL (ref 70–99)
Potassium: 4.6 mmol/L (ref 3.5–5.2)
Sodium: 142 mmol/L (ref 134–144)
eGFR: 63 mL/min/1.73 (ref >59)

## 2024-05-22 LAB — CBC
Hematocrit: 43.3 % (ref 34.0–46.6)
Hemoglobin: 13.4 g/dL (ref 11.1–15.9)
MCH: 29.1 pg (ref 26.6–33.0)
MCHC: 30.9 g/dL — ABNORMAL LOW (ref 31.5–35.7)
MCV: 94 fL (ref 79–97)
Platelets: 219 x10E3/uL (ref 150–450)
RBC: 4.6 x10E6/uL (ref 3.77–5.28)
RDW: 14.2 % (ref 11.7–15.4)
WBC: 7.8 x10E3/uL (ref 3.4–10.8)

## 2024-05-22 LAB — MAGNESIUM: Magnesium: 2 mg/dL (ref 1.6–2.3)

## 2024-06-27 ENCOUNTER — Other Ambulatory Visit: Payer: Self-pay

## 2024-06-27 ENCOUNTER — Emergency Department (HOSPITAL_COMMUNITY)

## 2024-06-27 ENCOUNTER — Emergency Department (HOSPITAL_COMMUNITY)
Admission: EM | Admit: 2024-06-27 | Discharge: 2024-06-27 | Disposition: A | Discharge status: Home / Self Care | Attending: Emergency Medicine | Admitting: Emergency Medicine

## 2024-06-27 DIAGNOSIS — S3991XA Unspecified injury of abdomen, initial encounter: Secondary | ICD-10-CM | POA: Insufficient documentation

## 2024-06-27 DIAGNOSIS — M47812 Spondylosis without myelopathy or radiculopathy, cervical region: Secondary | ICD-10-CM | POA: Diagnosis not present

## 2024-06-27 DIAGNOSIS — Y92810 Car as the place of occurrence of the external cause: Secondary | ICD-10-CM | POA: Insufficient documentation

## 2024-06-27 DIAGNOSIS — S3993XA Unspecified injury of pelvis, initial encounter: Secondary | ICD-10-CM | POA: Diagnosis not present

## 2024-06-27 DIAGNOSIS — N179 Acute kidney failure, unspecified: Secondary | ICD-10-CM | POA: Insufficient documentation

## 2024-06-27 DIAGNOSIS — W08XXXA Fall from other furniture, initial encounter: Secondary | ICD-10-CM | POA: Diagnosis not present

## 2024-06-27 DIAGNOSIS — I672 Cerebral atherosclerosis: Secondary | ICD-10-CM | POA: Diagnosis not present

## 2024-06-27 DIAGNOSIS — Z7901 Long term (current) use of anticoagulants: Secondary | ICD-10-CM | POA: Insufficient documentation

## 2024-06-27 DIAGNOSIS — E7800 Pure hypercholesterolemia, unspecified: Secondary | ICD-10-CM | POA: Diagnosis not present

## 2024-06-27 DIAGNOSIS — S29012A Strain of muscle and tendon of back wall of thorax, initial encounter: Secondary | ICD-10-CM | POA: Diagnosis not present

## 2024-06-27 DIAGNOSIS — M47814 Spondylosis without myelopathy or radiculopathy, thoracic region: Secondary | ICD-10-CM | POA: Diagnosis not present

## 2024-06-27 DIAGNOSIS — M4802 Spinal stenosis, cervical region: Secondary | ICD-10-CM | POA: Diagnosis not present

## 2024-06-27 DIAGNOSIS — D500 Iron deficiency anemia secondary to blood loss (chronic): Secondary | ICD-10-CM | POA: Diagnosis not present

## 2024-06-27 DIAGNOSIS — Z0000 Encounter for general adult medical examination without abnormal findings: Secondary | ICD-10-CM | POA: Diagnosis not present

## 2024-06-27 DIAGNOSIS — I10 Essential (primary) hypertension: Secondary | ICD-10-CM | POA: Diagnosis not present

## 2024-06-27 DIAGNOSIS — S199XXA Unspecified injury of neck, initial encounter: Secondary | ICD-10-CM | POA: Diagnosis not present

## 2024-06-27 DIAGNOSIS — S61210A Laceration without foreign body of right index finger without damage to nail, initial encounter: Secondary | ICD-10-CM | POA: Diagnosis not present

## 2024-06-27 DIAGNOSIS — W19XXXA Unspecified fall, initial encounter: Secondary | ICD-10-CM

## 2024-06-27 DIAGNOSIS — S299XXA Unspecified injury of thorax, initial encounter: Secondary | ICD-10-CM | POA: Diagnosis not present

## 2024-06-27 DIAGNOSIS — R079 Chest pain, unspecified: Secondary | ICD-10-CM | POA: Diagnosis not present

## 2024-06-27 DIAGNOSIS — M549 Dorsalgia, unspecified: Secondary | ICD-10-CM | POA: Diagnosis not present

## 2024-06-27 DIAGNOSIS — I517 Cardiomegaly: Secondary | ICD-10-CM | POA: Diagnosis not present

## 2024-06-27 DIAGNOSIS — R0902 Hypoxemia: Secondary | ICD-10-CM | POA: Diagnosis not present

## 2024-06-27 DIAGNOSIS — S6991XA Unspecified injury of right wrist, hand and finger(s), initial encounter: Secondary | ICD-10-CM | POA: Diagnosis present

## 2024-06-27 DIAGNOSIS — Z043 Encounter for examination and observation following other accident: Secondary | ICD-10-CM | POA: Diagnosis not present

## 2024-06-27 DIAGNOSIS — S0990XA Unspecified injury of head, initial encounter: Secondary | ICD-10-CM | POA: Diagnosis not present

## 2024-06-27 DIAGNOSIS — E039 Hypothyroidism, unspecified: Secondary | ICD-10-CM | POA: Diagnosis not present

## 2024-06-27 DIAGNOSIS — Z79899 Other long term (current) drug therapy: Secondary | ICD-10-CM | POA: Diagnosis not present

## 2024-06-27 DIAGNOSIS — E559 Vitamin D deficiency, unspecified: Secondary | ICD-10-CM | POA: Diagnosis not present

## 2024-06-27 LAB — CBC WITH DIFFERENTIAL/PLATELET
Abs Immature Granulocytes: 0.02 K/uL (ref 0.00–0.07)
Basophils Absolute: 0.1 K/uL (ref 0.0–0.1)
Basophils Relative: 1 %
Eosinophils Absolute: 0.2 K/uL (ref 0.0–0.5)
Eosinophils Relative: 3 %
HCT: 43.9 % (ref 36.0–46.0)
Hemoglobin: 13.7 g/dL (ref 12.0–15.0)
Immature Granulocytes: 0 %
Lymphocytes Relative: 14 %
Lymphs Abs: 1.1 K/uL (ref 0.7–4.0)
MCH: 28.8 pg (ref 26.0–34.0)
MCHC: 31.2 g/dL (ref 30.0–36.0)
MCV: 92.2 fL (ref 80.0–100.0)
Monocytes Absolute: 0.6 K/uL (ref 0.1–1.0)
Monocytes Relative: 7 %
Neutro Abs: 6.3 K/uL (ref 1.7–7.7)
Neutrophils Relative %: 75 %
Platelets: 228 K/uL (ref 150–400)
RBC: 4.76 MIL/uL (ref 3.87–5.11)
RDW: 14.2 % (ref 11.5–15.5)
WBC: 8.3 K/uL (ref 4.0–10.5)
nRBC: 0 % (ref 0.0–0.2)

## 2024-06-27 LAB — COMPREHENSIVE METABOLIC PANEL WITH GFR
ALT: 15 U/L (ref 0–44)
AST: 23 U/L (ref 15–41)
Albumin: 3.5 g/dL (ref 3.5–5.0)
Alkaline Phosphatase: 82 U/L (ref 38–126)
Anion gap: 12 (ref 5–15)
BUN: 34 mg/dL — ABNORMAL HIGH (ref 8–23)
CO2: 28 mmol/L (ref 22–32)
Calcium: 9.1 mg/dL (ref 8.9–10.3)
Chloride: 99 mmol/L (ref 98–111)
Creatinine, Ser: 1.58 mg/dL — ABNORMAL HIGH (ref 0.44–1.00)
GFR, Estimated: 34 mL/min — ABNORMAL LOW (ref >60)
Glucose, Bld: 93 mg/dL (ref 70–99)
Potassium: 4.8 mmol/L (ref 3.5–5.1)
Sodium: 139 mmol/L (ref 135–145)
Total Bilirubin: 0.9 mg/dL (ref 0.0–1.2)
Total Protein: 7.8 g/dL (ref 6.5–8.1)

## 2024-06-27 LAB — I-STAT CHEM 8, ED
BUN: 38 mg/dL — ABNORMAL HIGH (ref 8–23)
Calcium, Ion: 1.07 mmol/L — ABNORMAL LOW (ref 1.15–1.40)
Chloride: 99 mmol/L (ref 98–111)
Creatinine, Ser: 1.6 mg/dL — ABNORMAL HIGH (ref 0.44–1.00)
Glucose, Bld: 91 mg/dL (ref 70–99)
HCT: 42 % (ref 36.0–46.0)
Hemoglobin: 14.3 g/dL (ref 12.0–15.0)
Potassium: 4.8 mmol/L (ref 3.5–5.1)
Sodium: 139 mmol/L (ref 135–145)
TCO2: 30 mmol/L (ref 22–32)

## 2024-06-27 MED ORDER — SODIUM CHLORIDE 0.9 % IV BOLUS
1000.0000 mL | Freq: Once | INTRAVENOUS | Status: AC
  Administered 2024-06-27: 1000 mL via INTRAVENOUS

## 2024-06-27 MED ORDER — IOHEXOL 350 MG/ML SOLN
75.0000 mL | Freq: Once | INTRAVENOUS | Status: AC | PRN
  Administered 2024-06-27: 75 mL via INTRAVENOUS

## 2024-06-27 NOTE — ED Notes (Signed)
 CCMD called.

## 2024-06-27 NOTE — Discharge Instructions (Addendum)
 Follow-up with your primary care provider for recheck of your kidney function.  Hold your Lasix  for a total of 3 days and increase your fluid intake gently.  You may take Tylenol  to help with your back pain.  If you develop any new or worsening symptoms then return to the ER.

## 2024-06-27 NOTE — ED Notes (Signed)
 Skin tear noted to R pointer finger. EDP assessed, requested to be dressed with quick clot and pressure dressing. RN applied.

## 2024-06-27 NOTE — Progress Notes (Signed)
 Orthopedic Tech Progress Note Patient Details:  ANMARIE FUKUSHIMA Feb 21, 1951 [id number]  Patient ID: Othel JINNY Her, female   DOB: 1950/11/10, 73 y.o.   MRN: [id number]  Responded to Level 2 Trauma Ortho not needed at the moment. Chord Takahashi F Jacques Fife 06/27/2024, 11:13 AM

## 2024-06-27 NOTE — ED Notes (Signed)
 Unable to obtain manual bp due to body habitus.

## 2024-06-27 NOTE — ED Provider Notes (Signed)
 [facility] Provider Note   CSN: [id number] Arrival date & time: 06/27/24  1038     Patient presents with: [name] is a 73 y.o. female.   HPI 73 year old female presents after a fall.  The patient had just finished seeing her doctor and was trying to get back into her SUV.  She was having to use a stepstool to get up but her foot slipped and she fell backwards.  She initially struck her mid thoracic back but then did hit her head on the ground as well.  No headache or significant head injury.  However she is on Eliquis .  She was activated as a level 2 trauma by EMS.  She is having 6 out of 10 mid thoracic back pain.  No weakness or numbness in her extremities.  She was having some chest pain, primarily over her sternum, mostly when she was breathing but this is improving.  No neck pain.  She is on Eliquis  for A-fib.  Prior to Admission medications   Medication Sig Start Date End Date Taking? Authorizing Provider  acetaminophen  (TYLENOL ) 500 MG tablet Take 1,000 mg by mouth every 6 (six) hours as needed for mild pain or headache.    [provider]  apixaban  (ELIQUIS ) 5 MG TABS tablet TAKE 1 TABLET BY MOUTH 2 TIMES DAILY. 05/06/24   Fernande Elspeth BROCKS, MD  clobetasol cream (TEMOVATE) 0.05 % Apply topically as needed (rash). 12/16/22   [provider]  escitalopram  (LEXAPRO ) 5 MG tablet Take 5 mg by mouth daily. 12/28/20   [provider]  flecainide  (TAMBOCOR ) 100 MG tablet Take 1 tablet (100 mg total) by mouth 2 (two) times daily. 05/21/24   Riddle, Suzann, NP  furosemide  (LASIX ) 20 MG tablet Take 40 mg by mouth every morning.     [provider]  levothyroxine  (SYNTHROID ) 88 MCG tablet Take 88 mcg by mouth every morning. 04/08/22   [provider]  losartan  (COZAAR ) 50 MG tablet Take 50 mg by mouth daily. 11/17/21   [provider]  metoprolol  tartrate (LOPRESSOR ) 50 MG tablet Take 1  tablet (50 mg total) by mouth 2 (two) times daily. 05/21/24   Riddle, Suzann, NP  omeprazole  (PRILOSEC) 20 MG capsule Take 1 capsule (20 mg total) by mouth 2 (two) times daily before a meal. 04/21/23   Geroldine Berg, MD    Allergies: Augmentin [amoxicillin-pot clavulanate] and Pneumococcal vaccines    Review of Systems  Respiratory:  Negative for shortness of breath.   Cardiovascular:  Positive for chest pain.  Gastrointestinal:  Negative for abdominal pain.  Musculoskeletal:  Positive for back pain.  Neurological:  Negative for syncope, light-headedness and headaches.    Updated Vital Signs BP (!) 164/101   Pulse 60   Temp 98.6 F (37 C) (Oral)   Resp (!) 22   Ht 5' 7 (1.702 m)   Wt (!) 170 kg   SpO2 100%   BMI 58.70 kg/m   Physical Exam Vitals and nursing note reviewed. Exam conducted with a chaperone present.  Constitutional:      General: She is not in acute distress.    Appearance: She is well-developed. She is obese. She is not ill-appearing or diaphoretic.  HENT:     Head: Normocephalic and atraumatic.  Cardiovascular:     Rate and Rhythm: Normal rate and regular rhythm.     Heart sounds: Normal heart sounds.  Pulmonary:  Effort: Pulmonary effort is normal.     Breath sounds: Normal breath sounds.  Chest:     Chest wall: Tenderness present.    Abdominal:     Palpations: Abdomen is soft.     Tenderness: There is no abdominal tenderness.  Musculoskeletal:     Cervical back: No tenderness. No spinous process tenderness or muscular tenderness.     Thoracic back: Tenderness present.     Lumbar back: No tenderness.       Back:     Comments: Right index finger has a triangular flap laceration that is quite superficial with some mild bleeding.  This is just proximal to but does not involve the nail  Skin:    General: Skin is warm and dry.  Neurological:     Mental Status: She is alert.     (all labs ordered are listed, but only abnormal results are  displayed) Labs Reviewed  COMPREHENSIVE METABOLIC PANEL WITH GFR - Abnormal; Notable for the following components:      Result Value   BUN 34 (*)    Creatinine, Ser 1.58 (*)    GFR, Estimated 34 (*)    All other components within normal limits  I-STAT CHEM 8, ED - Abnormal; Notable for the following components:   BUN 38 (*)    Creatinine, Ser 1.60 (*)    Calcium , Ion 1.07 (*)    All other components within normal limits  CBC WITH DIFFERENTIAL/PLATELET  TYPE AND SCREEN    EKG: EKG Interpretation Date/Time:  Thursday June 27 2024 13:00:13 EST Ventricular Rate:  60 PR Interval:    QRS Duration:  134 QT Interval:  501 QTC Calculation: 501 R Axis:   -62  Text Interpretation: Sinus rhythm Nonspecific IVCD with LAD Left ventricular hypertrophy similar to Sept 2025 Confirmed by Freddi Hamilton [phone]) on 06/27/2024 1:07:31 PM  Radiology: CT T-SPINE NO CHARGE Result Date: 06/27/2024 EXAM: CT THORACIC SPINE WITHOUT CONTRAST 06/27/2024 11:35:16 AM TECHNIQUE: CT of the thoracic spine was performed without the administration of intravenous contrast. Multiplanar reformatted images are provided for review. Automated exposure control, iterative reconstruction, and/or weight based adjustment of the mA/kV was utilized to reduce the radiation dose to as low as reasonably achievable. COMPARISON: None available. CLINICAL HISTORY: FINDINGS: BONES: No acute fracture or traumatic listhesis of the thoracic spine. Multilevel degenerative changes with disc height loss and osteophytosis, most pronounced at the mid to lower thoracic spine. Bulky anterior flowing osteophytosis of the mid to lower thoracic spine. No discrete rib fracture identified. SOFT TISSUES: No acute abnormality. IMPRESSION: 1. No acute fracture or traumatic listhesis of the thoracic spine. 2. Mild-to-moderate multilevel degenerative changes of the thoracic spine. Electronically signed by: Shahmeer Lateef MD 06/27/2024 12:49 PM EST RP  Workstation: [id number]   CT Head Wo Contrast Result Date: 06/27/2024 EXAM: CT HEAD WITHOUT CONTRAST 06/27/2024 11:35:16 AM TECHNIQUE: CT of the head was performed without the administration of intravenous contrast. Automated exposure control, iterative reconstruction, and/or weight based adjustment of the mA/kV was utilized to reduce the radiation dose to as low as reasonably achievable. COMPARISON: None available. CLINICAL HISTORY: Polytrauma, blunt. FINDINGS: BRAIN AND VENTRICLES: No acute hemorrhage. No evidence of acute infarct. No hydrocephalus. No extra-axial collection. No mass effect or midline shift. Atherosclerosis of skullbase vasculature without hyperdense vessel or abnormal calcification. Focal calcification is again noted along the left lateral aspect of the tentorium. ORBITS: No acute abnormality. SINUSES: No acute abnormality. SOFT TISSUES AND SKULL: No acute soft tissue abnormality. No  skull fracture. IMPRESSION: 1. No acute intracranial abnormality. Electronically signed by: Lonni Necessary MD 06/27/2024 12:10 PM EST RP Workstation: [id number]   CT CHEST ABDOMEN PELVIS W CONTRAST Result Date: 06/27/2024 EXAM: CT CHEST, ABDOMEN AND PELVIS WITH CONTRAST 06/27/2024 11:35:16 AM TECHNIQUE: CT of the chest, abdomen and pelvis was performed with the administration of 75 mL of iohexol  (OMNIPAQUE ) 350 MG/ML injection. Multiplanar reformatted images are provided for review. Automated exposure control, iterative reconstruction, and/or weight based adjustment of the mA/kV was utilized to reduce the radiation dose to as low as reasonably achievable. COMPARISON: Chest radiograph of earlier today. Abdominal pelvic CT of 04/20/2023. CLINICAL HISTORY: Polytrauma, blunt. FINDINGS: CHEST: MEDIASTINUM AND LYMPH NODES: Mild cardiomegaly. LAD and right coronary artery calcification. Pericardium is unremarkable. The central airways are clear. No mediastinal, hilar or axillary lymphadenopathy. No mediastinal  hematoma or aortic laceration. Tiny hiatal hernia. Aortic atherosclerosis. LUNGS AND PLEURA: Mildly limited evaluation of the chest secondary to minimal motion and patient arm position, not raised above the head. Minimal biapical pleuroparenchymal scarring. A right middle lobe 5 mm pulmonary nodule on image 77/6 can be presumed benign per Fleischner Society criteria and does not warrant imaging follow-up. No pulmonary contusion. No focal consolidation or pulmonary edema. No pleural effusion or pneumothorax. ABDOMEN AND PELVIS: LIVER: The liver is unremarkable. GALLBLADDER AND BILE DUCTS: Cholecystectomy. No biliary ductal dilatation. SPLEEN: No acute abnormality. PANCREAS: No acute abnormality. ADRENAL GLANDS: No acute abnormality. KIDNEYS, URETERS AND BLADDER: No stones in the kidneys or ureters. No hydronephrosis. No perinephric or periureteral stranding. Urinary bladder is unremarkable. GI AND BOWEL: Stomach demonstrates no acute abnormality. Normal small bowel. There is no bowel obstruction. Scattered colonic diverticula. REPRODUCTIVE ORGANS: No acute abnormality. PERITONEUM AND RETROPERITONEUM: No free intraperitoneal air. No significant free fluid. VASCULATURE: Aorta is normal in caliber. Abdominal aortic atherosclerosis. ABDOMINAL AND PELVIS LYMPH NODES: Mild right external iliac adenopathy at 1.2 cm is similar to 04/20/2023 and can be presumed benign/reactive. BONES AND SOFT TISSUES: Mild to moderate degenerative changes of both hips. Lumbosacral spondylosis. No acute osseous abnormality. No focal soft tissue abnormality. IMPRESSION: 1. Artifact degradation, including patient body habitus, arm position, and minimal motion. 2. Given this limitation, no posttraumatic deformity identified. 3. Incidental findings, including: Aortic atherosclerosis (icd-[id number]). Coronary artery atherosclerosis. Tiny hiatal hernia. Electronically signed by: Rockey Kilts MD 06/27/2024 12:10 PM EST RP Workstation: [id number]   CT  Cervical Spine Wo Contrast Result Date: 06/27/2024 EXAM: CT CERVICAL SPINE WITHOUT CONTRAST 06/27/2024 11:35:16 AM TECHNIQUE: CT of the cervical spine was performed without the administration of intravenous contrast. Multiplanar reformatted images are provided for review. Automated exposure control, iterative reconstruction, and/or weight based adjustment of the mA/kV was utilized to reduce the radiation dose to as low as reasonably achievable. COMPARISON: None available. CLINICAL HISTORY: Polytrauma, blunt. FINDINGS: CERVICAL SPINE: BONES AND ALIGNMENT: No acute fracture or traumatic malalignment. DEGENERATIVE CHANGES: At C3-C4, vertebral spurring contributes to moderate right and mild left foraminal stenosis. At C4-C5, vertebral spurring contributes to moderate right and mild left foraminal stenosis. At C5-C6, there is moderate bilateral foraminal narrowing, worse on the right. SOFT TISSUES: No prevertebral soft tissue swelling. IMPRESSION: 1. No acute abnormality of the cervical spine. Electronically signed by: Lonni Necessary MD 06/27/2024 12:08 PM EST RP Workstation: [id number]   DG Chest Portable 1 View Result Date: 06/27/2024 EXAM: 1 VIEW(S) XRAY OF THE CHEST 06/27/2024 11:00:00 AM COMPARISON: None available. CLINICAL HISTORY: fall FINDINGS: LINES, TUBES AND DEVICES: Left chest cardiac loop recorder in place. LUNGS AND PLEURA:  No focal pulmonary opacity. No pulmonary edema. No pleural effusion. No pneumothorax. HEART AND MEDIASTINUM: Mild cardiomegaly. BONES AND SOFT TISSUES: No acute osseous abnormality. IMPRESSION: 1. No acute cardiopulmonary process detected. Electronically signed by: Rockey Kilts MD 06/27/2024 11:56 AM EST RP Workstation: [id number]     Procedures   Medications Ordered in the ED  iohexol  (OMNIPAQUE ) 350 MG/ML injection 75 mL (75 mLs Intravenous Contrast Given 06/27/24 1136)  sodium chloride  0.9 % bolus 1,000 mL (1,000 mLs Intravenous New Bag/Given 06/27/24 1153)                                     Medical Decision Making Amount and/or Complexity of Data Reviewed Independent Historian: EMS Labs: ordered.    Details: Mild acute kidney injury. Radiology: ordered and independent interpretation performed.    Details: No head bleed or thoracic fracture ECG/medicine tests: ordered and independent interpretation performed.    Details: Sinus rhythm  Risk Prescription drug management.   Patient arrives as a level 2 trauma.  Hemodynamically stable but does have some hypertension.  Otherwise, has declined thing for pain.  CTs are overall unremarkable.  They were obtained given she is on a blood thinner as well as her chest/back trauma to help rule out occult fracture or more serious injury such as mediastinal hematoma.  The patient's CTs are unremarkable and she is feeling well enough for discharge.  She is noted to have a mild acute kidney injury.  She has not eaten today though has had a little bit of water  due to her blood work for her doctor.  She was instructed to hold her Lasix  for the next couple days and drink some increased fluids.  However she has not had any urinary symptoms, vomiting, diarrhea, etc.  I do not think she needs to be admitted for this.  Will give her a liter of fluids but otherwise I think she is stable for discharge.  She is noted to have a finger laceration/avulsion that is very superficial and thin.  We discussed we could try to stitch this but I am a little worried that it would not hold.  Applied some quick clot and a dressing and there has been no further bleeding.  Will have her follow-up with her PCP.  Given return precautions.     Final diagnoses:  Fall, initial encounter  Strain of thoracic back region  Acute kidney injury  Laceration of right index finger without foreign body without damage to nail, initial encounter    ED Discharge Orders     None          Freddi Hamilton, MD 06/27/24 1311

## 2024-06-27 NOTE — Progress Notes (Signed)
 Responded to page to support pt that experienced a fall. Pt. C/O back pain and said that she hit her head but not hard.  Chaplain provided emotional and spiritual support.  Chaplain available as needed.  Rayleen Dade, [place], [facility], Pager [phone]

## 2024-06-27 NOTE — ED Triage Notes (Signed)
 Pt. BIB [facility] fro her Doctor's Appointment; Pt. Was attempting to get into her car using a step-stool and fell; landing on her back and then hitting her head; Pt. Is on Eliquis ; and has a laceration on her L index finger; VSS per [facility].   VS per [facility]:  BP: 140/80 SpO2: 93% RA HR: 68

## 2024-06-28 LAB — LAB REPORT - SCANNED
EGFR: 37
TSH: 2.2 (ref 0.41–5.90)

## 2024-06-30 LAB — TYPE AND SCREEN
ABO/RH(D): B NEG
Antibody Screen: POSITIVE
Donor AG Type: NEGATIVE
Donor AG Type: NEGATIVE
Unit division: 0
Unit division: 0

## 2024-06-30 LAB — BPAM RBC
Blood Product Expiration Date: 202511242359
Blood Product Expiration Date: 202511242359
Unit Type and Rh: 1700
Unit Type and Rh: 1700

## 2024-07-04 DIAGNOSIS — Z23 Encounter for immunization: Secondary | ICD-10-CM | POA: Diagnosis not present

## 2024-07-04 DIAGNOSIS — I480 Paroxysmal atrial fibrillation: Secondary | ICD-10-CM | POA: Diagnosis not present

## 2024-07-04 DIAGNOSIS — D500 Iron deficiency anemia secondary to blood loss (chronic): Secondary | ICD-10-CM | POA: Diagnosis not present

## 2024-07-04 DIAGNOSIS — E039 Hypothyroidism, unspecified: Secondary | ICD-10-CM | POA: Diagnosis not present

## 2024-07-04 DIAGNOSIS — R7989 Other specified abnormal findings of blood chemistry: Secondary | ICD-10-CM | POA: Diagnosis not present

## 2024-07-04 DIAGNOSIS — E559 Vitamin D deficiency, unspecified: Secondary | ICD-10-CM | POA: Diagnosis not present

## 2024-07-04 DIAGNOSIS — I10 Essential (primary) hypertension: Secondary | ICD-10-CM | POA: Diagnosis not present

## 2024-07-04 DIAGNOSIS — Z7901 Long term (current) use of anticoagulants: Secondary | ICD-10-CM | POA: Diagnosis not present

## 2024-07-04 DIAGNOSIS — E7800 Pure hypercholesterolemia, unspecified: Secondary | ICD-10-CM | POA: Diagnosis not present

## 2024-07-04 DIAGNOSIS — Z0000 Encounter for general adult medical examination without abnormal findings: Secondary | ICD-10-CM | POA: Diagnosis not present

## 2024-07-04 DIAGNOSIS — F3340 Major depressive disorder, recurrent, in remission, unspecified: Secondary | ICD-10-CM | POA: Diagnosis not present

## 2024-07-04 DIAGNOSIS — L304 Erythema intertrigo: Secondary | ICD-10-CM | POA: Diagnosis not present

## 2024-07-04 DIAGNOSIS — M8580 Other specified disorders of bone density and structure, unspecified site: Secondary | ICD-10-CM | POA: Diagnosis not present

## 2024-09-03 ENCOUNTER — Emergency Department (HOSPITAL_COMMUNITY)
Admission: EM | Admit: 2024-09-03 | Discharge: 2024-09-04 | Disposition: A | Discharge status: Home / Self Care | Source: Home / Self Care | Attending: Emergency Medicine | Admitting: Emergency Medicine

## 2024-09-03 DIAGNOSIS — Z7901 Long term (current) use of anticoagulants: Secondary | ICD-10-CM | POA: Insufficient documentation

## 2024-09-03 DIAGNOSIS — D72829 Elevated white blood cell count, unspecified: Secondary | ICD-10-CM | POA: Insufficient documentation

## 2024-09-03 DIAGNOSIS — D582 Other hemoglobinopathies: Secondary | ICD-10-CM | POA: Insufficient documentation

## 2024-09-03 DIAGNOSIS — R1011 Right upper quadrant pain: Secondary | ICD-10-CM | POA: Insufficient documentation

## 2024-09-03 DIAGNOSIS — R7989 Other specified abnormal findings of blood chemistry: Secondary | ICD-10-CM | POA: Insufficient documentation

## 2024-09-03 DIAGNOSIS — N390 Urinary tract infection, site not specified: Secondary | ICD-10-CM | POA: Insufficient documentation

## 2024-09-03 DIAGNOSIS — R109 Unspecified abdominal pain: Secondary | ICD-10-CM

## 2024-09-03 NOTE — ED Triage Notes (Signed)
 Pt arrived with EMS c/o RUQ pain worse on movement x 2 days. Described as spasm type pain. Denies NV  VS 172/palpated, cbg 136, 74, 92%

## 2024-09-04 ENCOUNTER — Emergency Department (HOSPITAL_COMMUNITY)

## 2024-09-04 ENCOUNTER — Other Ambulatory Visit: Payer: Self-pay

## 2024-09-04 ENCOUNTER — Inpatient Hospital Stay (HOSPITAL_COMMUNITY)
Admission: EM | Admit: 2024-09-04 | Discharge: 2024-09-13 | DRG: 291 | Disposition: A | Discharge status: Home / Self Care | Attending: Internal Medicine | Admitting: Internal Medicine

## 2024-09-04 ENCOUNTER — Inpatient Hospital Stay (HOSPITAL_COMMUNITY)

## 2024-09-04 ENCOUNTER — Encounter (HOSPITAL_COMMUNITY): Payer: Self-pay | Admitting: Emergency Medicine

## 2024-09-04 ENCOUNTER — Encounter (HOSPITAL_COMMUNITY): Payer: Self-pay | Admitting: *Deleted

## 2024-09-04 DIAGNOSIS — E039 Hypothyroidism, unspecified: Secondary | ICD-10-CM | POA: Diagnosis present

## 2024-09-04 DIAGNOSIS — R109 Unspecified abdominal pain: Secondary | ICD-10-CM | POA: Diagnosis present

## 2024-09-04 DIAGNOSIS — S7012XA Contusion of left thigh, initial encounter: Secondary | ICD-10-CM

## 2024-09-04 DIAGNOSIS — I480 Paroxysmal atrial fibrillation: Secondary | ICD-10-CM | POA: Diagnosis present

## 2024-09-04 DIAGNOSIS — N390 Urinary tract infection, site not specified: Secondary | ICD-10-CM

## 2024-09-04 DIAGNOSIS — K5730 Diverticulosis of large intestine without perforation or abscess without bleeding: Secondary | ICD-10-CM | POA: Diagnosis present

## 2024-09-04 DIAGNOSIS — E66813 Obesity, class 3: Secondary | ICD-10-CM | POA: Diagnosis present

## 2024-09-04 DIAGNOSIS — K760 Fatty (change of) liver, not elsewhere classified: Secondary | ICD-10-CM | POA: Diagnosis present

## 2024-09-04 DIAGNOSIS — I509 Heart failure, unspecified: Secondary | ICD-10-CM

## 2024-09-04 DIAGNOSIS — R188 Other ascites: Secondary | ICD-10-CM | POA: Diagnosis present

## 2024-09-04 DIAGNOSIS — D6832 Hemorrhagic disorder due to extrinsic circulating anticoagulants: Secondary | ICD-10-CM | POA: Diagnosis present

## 2024-09-04 DIAGNOSIS — D62 Acute posthemorrhagic anemia: Secondary | ICD-10-CM | POA: Diagnosis present

## 2024-09-04 DIAGNOSIS — I10 Essential (primary) hypertension: Secondary | ICD-10-CM | POA: Diagnosis not present

## 2024-09-04 DIAGNOSIS — I130 Hypertensive heart and chronic kidney disease with heart failure and stage 1 through stage 4 chronic kidney disease, or unspecified chronic kidney disease: Secondary | ICD-10-CM | POA: Diagnosis present

## 2024-09-04 DIAGNOSIS — J9601 Acute respiratory failure with hypoxia: Principal | ICD-10-CM | POA: Diagnosis present

## 2024-09-04 DIAGNOSIS — Z8261 Family history of arthritis: Secondary | ICD-10-CM

## 2024-09-04 DIAGNOSIS — N1831 Chronic kidney disease, stage 3a: Secondary | ICD-10-CM | POA: Diagnosis present

## 2024-09-04 DIAGNOSIS — Z8616 Personal history of COVID-19: Secondary | ICD-10-CM

## 2024-09-04 DIAGNOSIS — Z7901 Long term (current) use of anticoagulants: Secondary | ICD-10-CM

## 2024-09-04 DIAGNOSIS — I5033 Acute on chronic diastolic (congestive) heart failure: Secondary | ICD-10-CM | POA: Diagnosis present

## 2024-09-04 DIAGNOSIS — N179 Acute kidney failure, unspecified: Secondary | ICD-10-CM | POA: Diagnosis not present

## 2024-09-04 DIAGNOSIS — Z9049 Acquired absence of other specified parts of digestive tract: Secondary | ICD-10-CM

## 2024-09-04 DIAGNOSIS — Z6841 Body Mass Index (BMI) 40.0 and over, adult: Secondary | ICD-10-CM | POA: Diagnosis not present

## 2024-09-04 DIAGNOSIS — R911 Solitary pulmonary nodule: Secondary | ICD-10-CM | POA: Diagnosis present

## 2024-09-04 DIAGNOSIS — Z79890 Hormone replacement therapy (postmenopausal): Secondary | ICD-10-CM | POA: Diagnosis not present

## 2024-09-04 DIAGNOSIS — Z87891 Personal history of nicotine dependence: Secondary | ICD-10-CM

## 2024-09-04 DIAGNOSIS — M7981 Nontraumatic hematoma of soft tissue: Secondary | ICD-10-CM | POA: Diagnosis present

## 2024-09-04 DIAGNOSIS — Z881 Allergy status to other antibiotic agents status: Secondary | ICD-10-CM

## 2024-09-04 DIAGNOSIS — Z96653 Presence of artificial knee joint, bilateral: Secondary | ICD-10-CM | POA: Diagnosis present

## 2024-09-04 DIAGNOSIS — Z8249 Family history of ischemic heart disease and other diseases of the circulatory system: Secondary | ICD-10-CM

## 2024-09-04 DIAGNOSIS — I959 Hypotension, unspecified: Secondary | ICD-10-CM | POA: Diagnosis not present

## 2024-09-04 DIAGNOSIS — Z887 Allergy status to serum and vaccine status: Secondary | ICD-10-CM

## 2024-09-04 DIAGNOSIS — K449 Diaphragmatic hernia without obstruction or gangrene: Secondary | ICD-10-CM | POA: Diagnosis present

## 2024-09-04 DIAGNOSIS — F32A Depression, unspecified: Secondary | ICD-10-CM | POA: Diagnosis present

## 2024-09-04 DIAGNOSIS — I5031 Acute diastolic (congestive) heart failure: Secondary | ICD-10-CM | POA: Diagnosis not present

## 2024-09-04 DIAGNOSIS — T45515A Adverse effect of anticoagulants, initial encounter: Secondary | ICD-10-CM | POA: Diagnosis present

## 2024-09-04 DIAGNOSIS — Z79899 Other long term (current) drug therapy: Secondary | ICD-10-CM

## 2024-09-04 LAB — CBC WITH DIFFERENTIAL/PLATELET
Abs Immature Granulocytes: 0.05 K/uL (ref 0.00–0.07)
Basophils Absolute: 0.1 K/uL (ref 0.0–0.1)
Basophils Relative: 1 %
Eosinophils Absolute: 0.1 K/uL (ref 0.0–0.5)
Eosinophils Relative: 1 %
HCT: 49.2 % — ABNORMAL HIGH (ref 36.0–46.0)
Hemoglobin: 15.5 g/dL — ABNORMAL HIGH (ref 12.0–15.0)
Immature Granulocytes: 1 %
Lymphocytes Relative: 7 %
Lymphs Abs: 0.8 K/uL (ref 0.7–4.0)
MCH: 29 pg (ref 26.0–34.0)
MCHC: 31.5 g/dL (ref 30.0–36.0)
MCV: 92.1 fL (ref 80.0–100.0)
Monocytes Absolute: 0.6 K/uL (ref 0.1–1.0)
Monocytes Relative: 6 %
Neutro Abs: 9.2 K/uL — ABNORMAL HIGH (ref 1.7–7.7)
Neutrophils Relative %: 84 %
Platelets: 207 K/uL (ref 150–400)
RBC: 5.34 MIL/uL — ABNORMAL HIGH (ref 3.87–5.11)
RDW: 14.5 % (ref 11.5–15.5)
WBC: 10.8 K/uL — ABNORMAL HIGH (ref 4.0–10.5)
nRBC: 0 % (ref 0.0–0.2)

## 2024-09-04 LAB — COMPREHENSIVE METABOLIC PANEL WITH GFR
ALT: 12 U/L (ref 0–44)
AST: 29 U/L (ref 15–41)
Albumin: 4.1 g/dL (ref 3.5–5.0)
Alkaline Phosphatase: 120 U/L (ref 38–126)
Anion gap: 11 (ref 5–15)
BUN: 25 mg/dL — ABNORMAL HIGH (ref 8–23)
CO2: 31 mmol/L (ref 22–32)
Calcium: 9.8 mg/dL (ref 8.9–10.3)
Chloride: 95 mmol/L — ABNORMAL LOW (ref 98–111)
Creatinine, Ser: 1.09 mg/dL — ABNORMAL HIGH (ref 0.44–1.00)
GFR, Estimated: 53 mL/min — ABNORMAL LOW
Glucose, Bld: 100 mg/dL — ABNORMAL HIGH (ref 70–99)
Potassium: 3.9 mmol/L (ref 3.5–5.1)
Sodium: 138 mmol/L (ref 135–145)
Total Bilirubin: 0.9 mg/dL (ref 0.0–1.2)
Total Protein: 8.9 g/dL — ABNORMAL HIGH (ref 6.5–8.1)

## 2024-09-04 LAB — URINALYSIS, ROUTINE W REFLEX MICROSCOPIC
Bilirubin Urine: NEGATIVE
Glucose, UA: NEGATIVE mg/dL
Ketones, ur: NEGATIVE mg/dL
Nitrite: NEGATIVE
Protein, ur: 100 mg/dL — AB
Specific Gravity, Urine: 1.027 (ref 1.005–1.030)
pH: 6 (ref 5.0–8.0)

## 2024-09-04 LAB — PRO BRAIN NATRIURETIC PEPTIDE: Pro Brain Natriuretic Peptide: 2538 pg/mL — ABNORMAL HIGH

## 2024-09-04 LAB — LIPASE, BLOOD: Lipase: 48 U/L (ref 11–51)

## 2024-09-04 LAB — TROPONIN T, HIGH SENSITIVITY: Troponin T High Sensitivity: 22 ng/L — ABNORMAL HIGH (ref 0–19)

## 2024-09-04 MED ORDER — CEPHALEXIN 250 MG PO CAPS
500.0000 mg | ORAL_CAPSULE | Freq: Once | ORAL | Status: AC
  Administered 2024-09-04: 500 mg via ORAL
  Filled 2024-09-04: qty 2

## 2024-09-04 MED ORDER — SODIUM CHLORIDE 0.9 % IV SOLN: INTRAVENOUS | Status: DC

## 2024-09-04 MED ORDER — LEVOTHYROXINE SODIUM 88 MCG PO TABS
88.0000 ug | ORAL_TABLET | Freq: Every morning | ORAL | Status: DC
  Administered 2024-09-05 – 2024-09-13 (×9): 88 ug via ORAL
  Filled 2024-09-04 (×9): qty 1

## 2024-09-04 MED ORDER — FUROSEMIDE 10 MG/ML IJ SOLN
40.0000 mg | Freq: Once | INTRAMUSCULAR | Status: AC
  Administered 2024-09-04: 40 mg via INTRAVENOUS
  Filled 2024-09-04: qty 4

## 2024-09-04 MED ORDER — ACETAMINOPHEN 500 MG PO TABS
500.0000 mg | ORAL_TABLET | Freq: Every day | ORAL | Status: DC | PRN
  Administered 2024-09-04 – 2024-09-06 (×3): 1000 mg via ORAL
  Administered 2024-09-11: 650 mg via ORAL
  Filled 2024-09-04 (×3): qty 2

## 2024-09-04 MED ORDER — ACETAMINOPHEN 325 MG PO TABS
650.0000 mg | ORAL_TABLET | ORAL | Status: AC
  Administered 2024-09-04: 650 mg via ORAL
  Filled 2024-09-04: qty 2

## 2024-09-04 MED ORDER — POLYETHYLENE GLYCOL 3350 17 G PO PACK
17.0000 g | PACK | Freq: Every day | ORAL | Status: DC | PRN
  Administered 2024-09-05: 17 g via ORAL
  Filled 2024-09-04: qty 1

## 2024-09-04 MED ORDER — OXYCODONE-ACETAMINOPHEN 5-325 MG PO TABS
1.0000 | ORAL_TABLET | Freq: Once | ORAL | Status: AC
  Administered 2024-09-04: 1 via ORAL
  Filled 2024-09-04: qty 1

## 2024-09-04 MED ORDER — SODIUM CHLORIDE 0.9% FLUSH
3.0000 mL | Freq: Two times a day (BID) | INTRAVENOUS | Status: DC
  Administered 2024-09-04 – 2024-09-13 (×15): 3 mL via INTRAVENOUS

## 2024-09-04 MED ORDER — HYDRALAZINE HCL 20 MG/ML IJ SOLN: 5.0000 mg | Freq: Four times a day (QID) | INTRAMUSCULAR | Status: DC | PRN

## 2024-09-04 MED ORDER — ONDANSETRON HCL 4 MG/2ML IJ SOLN
4.0000 mg | Freq: Once | INTRAMUSCULAR | Status: AC
  Administered 2024-09-04: 4 mg via INTRAVENOUS
  Filled 2024-09-04: qty 2

## 2024-09-04 MED ORDER — MORPHINE SULFATE (PF) 4 MG/ML IV SOLN
4.0000 mg | Freq: Once | INTRAVENOUS | Status: AC
  Administered 2024-09-04: 4 mg via INTRAVENOUS
  Filled 2024-09-04: qty 1

## 2024-09-04 MED ORDER — ESCITALOPRAM OXALATE 10 MG PO TABS
5.0000 mg | ORAL_TABLET | Freq: Every day | ORAL | Status: DC
  Administered 2024-09-04 – 2024-09-13 (×10): 5 mg via ORAL
  Filled 2024-09-04 (×10): qty 1

## 2024-09-04 MED ORDER — SODIUM CHLORIDE 0.9 % IV SOLN: 250.0000 mL | INTRAVENOUS | Status: AC | PRN

## 2024-09-04 MED ORDER — CEPHALEXIN 500 MG PO CAPS
500.0000 mg | ORAL_CAPSULE | Freq: Two times a day (BID) | ORAL | Status: AC
  Administered 2024-09-04 – 2024-09-06 (×6): 500 mg via ORAL
  Filled 2024-09-04 (×2): qty 2
  Filled 2024-09-04: qty 1
  Filled 2024-09-04: qty 2
  Filled 2024-09-04 (×2): qty 1

## 2024-09-04 MED ORDER — IOHEXOL 350 MG/ML SOLN
100.0000 mL | Freq: Once | INTRAVENOUS | Status: AC | PRN
  Administered 2024-09-04: 100 mL via INTRAVENOUS

## 2024-09-04 MED ORDER — FLECAINIDE ACETATE 100 MG PO TABS
100.0000 mg | ORAL_TABLET | Freq: Two times a day (BID) | ORAL | Status: DC
  Administered 2024-09-04 – 2024-09-13 (×19): 100 mg via ORAL
  Filled 2024-09-04: qty 2
  Filled 2024-09-04 (×2): qty 1
  Filled 2024-09-04: qty 2
  Filled 2024-09-04 (×15): qty 1
  Filled 2024-09-04: qty 2
  Filled 2024-09-04: qty 1

## 2024-09-04 MED ORDER — FUROSEMIDE 10 MG/ML IJ SOLN
40.0000 mg | Freq: Two times a day (BID) | INTRAMUSCULAR | Status: DC
  Administered 2024-09-04 – 2024-09-06 (×4): 40 mg via INTRAVENOUS
  Filled 2024-09-04 (×4): qty 4

## 2024-09-04 MED ORDER — ONDANSETRON HCL 4 MG/2ML IJ SOLN: 4.0000 mg | Freq: Four times a day (QID) | INTRAMUSCULAR | Status: DC | PRN

## 2024-09-04 MED ORDER — SODIUM CHLORIDE 0.9% FLUSH: 3.0000 mL | INTRAVENOUS | Status: DC | PRN

## 2024-09-04 MED ORDER — PANTOPRAZOLE SODIUM 40 MG PO TBEC
40.0000 mg | DELAYED_RELEASE_TABLET | Freq: Every day | ORAL | Status: DC
  Administered 2024-09-04 – 2024-09-13 (×10): 40 mg via ORAL
  Filled 2024-09-04 (×12): qty 1

## 2024-09-04 MED ORDER — ONDANSETRON HCL 4 MG PO TABS: 4.0000 mg | ORAL_TABLET | Freq: Four times a day (QID) | ORAL | Status: DC | PRN

## 2024-09-04 MED ORDER — CEPHALEXIN 500 MG PO CAPS: 500.0000 mg | ORAL_CAPSULE | Freq: Two times a day (BID) | ORAL | 0 refills | Status: DC

## 2024-09-04 MED ORDER — METOPROLOL TARTRATE 50 MG PO TABS
50.0000 mg | ORAL_TABLET | Freq: Two times a day (BID) | ORAL | Status: DC
  Administered 2024-09-04 – 2024-09-13 (×17): 50 mg via ORAL
  Filled 2024-09-04 (×10): qty 1
  Filled 2024-09-04 (×2): qty 2
  Filled 2024-09-04 (×6): qty 1

## 2024-09-04 MED ORDER — APIXABAN 5 MG PO TABS
5.0000 mg | ORAL_TABLET | Freq: Two times a day (BID) | ORAL | Status: DC
  Administered 2024-09-04 (×2): 5 mg via ORAL
  Filled 2024-09-04 (×2): qty 1

## 2024-09-04 NOTE — ED Notes (Signed)
Full linen change 

## 2024-09-04 NOTE — ED Notes (Signed)
 Pt O2 sat between 72-80 on RA, RN notified. Pt placed on 2L via [place]

## 2024-09-04 NOTE — ED Notes (Signed)
 Lab to add on urine cx

## 2024-09-04 NOTE — ED Provider Notes (Signed)
 " [name] EMERGENCY DEPARTMENT AT [facility] Provider Note   CSN: [id number] Arrival date & time: 09/04/24  0448     Patient presents with: [place]   [name] is a 74 y.o. female.    Fall   Patient is a 74 year old female with a past medical history significant for A-fib, reflux, MDD, splenic infarct, morbid obesity, on Eliquis   Patient presents emergency room today with complaints of shortness of breath, generalized weakness and fatigue.  She actually denied complaint of shortness of breath until I asked her specifically about this.  She denies any chest pain.  Seems that she has had a week of fatigue and decreased activity.  Was seen last night and diagnosed with a urinary tract infection and ultimately discharged home but became extremely dyspneic and weak when attempting to get into her car.  Came back to the emergency room was found to be hypoxic at 80% on room air.  She normally does not wear oxygen.      Prior to Admission medications  Medication Sig Start Date End Date Taking? Authorizing Provider  acetaminophen  (TYLENOL ) 500 MG tablet Take 500-1,000 mg by mouth daily as needed for mild pain (pain score 1-3), headache or moderate pain (pain score 4-6).   Yes [provider]  apixaban  (ELIQUIS ) 5 MG TABS tablet TAKE 1 TABLET BY MOUTH 2 TIMES DAILY. 05/06/24  Yes Fernande Elspeth BROCKS, MD  clobetasol cream (TEMOVATE) 0.05 % Apply topically as needed (rash). 12/16/22  Yes [provider]  escitalopram  (LEXAPRO ) 5 MG tablet Take 5 mg by mouth daily. 12/28/20  Yes [provider]  flecainide  (TAMBOCOR ) 100 MG tablet Take 1 tablet (100 mg total) by mouth 2 (two) times daily. 05/21/24  Yes Riddle, Suzann, NP  furosemide  (LASIX ) 20 MG tablet Take 40 mg by mouth 2 (two) times daily.   Yes [provider]  levothyroxine  (SYNTHROID ) 88 MCG tablet Take 88 mcg by mouth every morning. 04/08/22  Yes [provider]  losartan  (COZAAR ) 50 MG  tablet Take 50 mg by mouth daily. 11/17/21  Yes [provider]  metoprolol  tartrate (LOPRESSOR ) 50 MG tablet Take 1 tablet (50 mg total) by mouth 2 (two) times daily. 05/21/24  Yes Riddle, Suzann, NP  omeprazole  (PRILOSEC) 20 MG capsule Take 1 capsule (20 mg total) by mouth 2 (two) times daily before a meal. Patient taking differently: Take 20 mg by mouth daily. 04/21/23  Yes Delo, Vicenta, MD  cephALEXin  (KEFLEX ) 500 MG capsule Take 1 capsule (500 mg total) by mouth 2 (two) times daily. Patient not taking: Reported on 09/04/2024 09/04/24   Keith, Kayla N, PA-C    Allergies: Augmentin [amoxicillin-pot clavulanate] and Pneumococcal vaccines    Review of Systems  Updated Vital Signs BP (!) 142/71 (BP Location: Left Arm)   Pulse 69   Temp 97.6 F (36.4 C) (Oral)   Resp (!) 22   SpO2 100%   Physical Exam Vitals and nursing note reviewed.  Constitutional:      General: She is not in acute distress. HENT:     Head: Normocephalic and atraumatic.     Nose: Nose normal.     Mouth/Throat:     Mouth: Mucous membranes are moist.  Eyes:     General: No scleral icterus. Cardiovascular:     Rate and Rhythm: Normal rate and regular rhythm.     Pulses: Normal pulses.     Heart sounds: Normal heart sounds.  Pulmonary:  Effort: Pulmonary effort is normal. No respiratory distress.     Breath sounds: No wheezing.  Abdominal:     Palpations: Abdomen is soft.     Tenderness: There is no abdominal tenderness.  Musculoskeletal:     Cervical back: Normal range of motion.     Right lower leg: No edema.     Left lower leg: No edema.  Skin:    General: Skin is warm and dry.     Capillary Refill: Capillary refill takes less than 2 seconds.  Neurological:     Mental Status: She is alert. Mental status is at baseline.  Psychiatric:        Mood and Affect: Mood normal.        Behavior: Behavior normal.     (all labs ordered are listed, but only abnormal results are displayed) Labs  Reviewed  PRO BRAIN NATRIURETIC PEPTIDE - Abnormal; Notable for the following components:      Result Value   Pro Brain Natriuretic Peptide 2,538.0 (*)    All other components within normal limits  TROPONIN T, HIGH SENSITIVITY - Abnormal; Notable for the following components:   Troponin T High Sensitivity 22 (*)    All other components within normal limits    EKG: EKG Interpretation Date/Time:  Wednesday September 04 2024 09:05:55 EST Ventricular Rate:  66 PR Interval:    QRS Duration:  136 QT Interval:  496 QTC Calculation: 519 R Axis:   -67  Text Interpretation: Wide QRS rhythm Left axis deviation Non-specific intra-ventricular conduction block Minimal voltage criteria for LVH, may be normal variant ( Cornell product ) Abnormal ECG When compared with ECG of 27-Jun-2024 13:00, No significant change since last tracing Confirmed by Towana Sharper [phone]) on 09/04/2024 9:08:21 AM  Radiology: [facility] Chest Port 1 View Result Date: 09/04/2024 EXAM: 1 VIEW(S) XRAY OF THE CHEST 09/04/2024 05:47:00 AM COMPARISON: 06/27/2024 CLINICAL HISTORY: The patient has a new oxygen requirement. FINDINGS: LINES, TUBES AND DEVICES: Cardiac loop recorder noted. LUNGS AND PLEURA: Mildly hypoinflated lungs. Mild diffuse pulmonary interstitial prominence, mildly increased from comparison. No focal pulmonary opacity. No pleural effusion. No pneumothorax. HEART AND MEDIASTINUM: Mild cardiomegaly. Aortic arch atherosclerosis. BONES AND SOFT TISSUES: No acute osseous abnormality. IMPRESSION: 1. Mild diffuse pulmonary interstitial prominence, mildly increased from comparison. 2. Mildly hypoinflated lungs. 3. Mild cardiomegaly and aortic arch atherosclerosis. Cardiac loop recorder noted. Electronically signed by: Evalene Coho MD 09/04/2024 06:08 AM EST RP Workstation: [id number]   CT ABDOMEN PELVIS W CONTRAST Result Date: 09/04/2024 EXAM: CT ABDOMEN AND PELVIS WITH CONTRAST 09/04/2024 01:19:31 AM TECHNIQUE: CT of the  abdomen and pelvis was performed with the administration of intravenous contrast. Multiplanar reformatted images are provided for review. Automated exposure control, iterative reconstruction, and/or weight-based adjustment of the mA/kV was utilized to reduce the radiation dose to as low as reasonably achievable. COMPARISON: 01/21/2024 CLINICAL HISTORY: Abdominal pain, acute, nonlocalized. FINDINGS: LOWER CHEST: Small hiatal hernia. Stable 5 mm subpleural pulmonary nodule within the visualized right middle lobe (series 3, image 5). New follow up imaging recommended. LIVER: Mild hepatic steatosis. GALLBLADDER AND BILE DUCTS: Status post cholecystectomy. No biliary ductal dilatation. SPLEEN: No acute abnormality. PANCREAS: No acute abnormality. ADRENAL GLANDS: No acute abnormality. KIDNEYS, URETERS AND BLADDER: No stones in the kidneys or ureters. No hydronephrosis. No perinephric or periureteral stranding. Urinary bladder is unremarkable. GI AND BOWEL: Severe sigmoid diverticulosis without superimposed acute inflammatory change. Scattered diverticular are seen throughout the remainder of the colon. The stomach, small bowel, and large  bowel are otherwise unremarkable. Appendix normal. PERITONEUM AND RETROPERITONEUM: Interval development of mild ascites. No free air. VASCULATURE: Mild aortoiliac atherosclerotic calcification. No aortic aneurysm. Aorta is normal in caliber. LYMPH NODES: No lymphadenopathy. REPRODUCTIVE ORGANS: No acute abnormality. BONES AND SOFT TISSUES: Severe bilateral degenerative hip arthritis. Advanced degenerative changes are seen within the visualized thoracolumbar spine. No acute bone abnormality. No lytic or blastic bone lesion. No focal soft tissue abnormality. IMPRESSION: 1. Interval development of mild ascites, abnormal but nonspecific . 2. Severe sigmoid diverticulosis without acute inflammatory change, with scattered diverticula elsewhere in the colon. 3. Stable 5 mm subpleural right  middle lobe pulmonary nodule; no routine follow-up imaging is recommended per Fleischner Society Guidelines. 4. Small hiatal hernia 5. mild hepatic steatosis 6. RAF score includes aortic atherosclerosis (ICD10-I70.0). Electronically signed by: Dorethia Molt MD 09/04/2024 01:28 AM EST RP Workstation: [id number]     .Critical Care  Performed by: Neldon Hamp RAMAN, PA Authorized by: Neldon Hamp RAMAN, PA   Critical care provider statement:    Critical care time (minutes):  35   Critical care time was exclusive of:  Separately billable procedures and treating other patients and teaching time   Critical care was necessary to treat or prevent imminent or life-threatening deterioration of the following conditions:  Respiratory failure   Critical care was time spent personally by me on the following activities:  Development of treatment plan with patient or surrogate, review of old charts, re-evaluation of patient's condition, pulse oximetry, ordering and review of radiographic studies, ordering and review of laboratory studies, ordering and performing treatments and interventions, obtaining history from patient or surrogate, examination of patient and evaluation of patient's response to treatment   Care discussed with: admitting provider      Medications Ordered in the ED  furosemide  (LASIX ) injection 40 mg (40 mg Intravenous Given 09/04/24 1027)  oxyCODONE -acetaminophen  (PERCOCET/ROXICET) 5-325 MG per tablet 1 tablet (1 tablet Oral Given 09/04/24 1027)  acetaminophen  (TYLENOL ) tablet 650 mg (650 mg Oral Given 09/04/24 1027)                                    Medical Decision Making Amount and/or Complexity of Data Reviewed ECG/medicine tests: ordered.  Risk OTC drugs. Prescription drug management. Decision regarding hospitalization.   Patient is a 74 year old female with a past medical history significant for A-fib, reflux, MDD, splenic infarct, morbid obesity, on Eliquis   Patient presents  emergency room today with complaints of shortness of breath, generalized weakness and fatigue.  She actually denied complaint of shortness of breath until I asked her specifically about this.  She denies any chest pain.  Seems that she has had a week of fatigue and decreased activity.  Was seen last night and diagnosed with a urinary tract infection and ultimately discharged home but became extremely dyspneic and weak when attempting to get into her car.  Came back to the emergency room was found to be hypoxic at 80% on room air.  She normally does not wear oxygen.  CBC with leukocytosis, BNP elevated, troponin mildly elevated likely secondary to fluid overload, chest x-ray without evidence of volume overload and EKG nonischemic.  Patient admitted to hospitalist service for hypoxia secondary to CHF  Patient also has UTI  Final diagnoses:  Acute respiratory failure with hypoxia [facility])  Lower urinary tract infectious disease    ED Discharge Orders     None  Neldon Hamp RAMAN, [place] 09/04/24 1431    Towana Ozell BROCKS, MD 09/05/24 [phone]  "

## 2024-09-04 NOTE — H&P (Addendum)
 " [facility] History and Physical  [name] FMW:[id number] DOB: Jan 09, 1951 DOA: 09/04/2024  Referring physician: ED  PCP: Clarice Nottingham, MD   Patient is coming from: Home  Chief Complaint: Shortness of breath and hypoxia  HPI:    Patient is a 74 years old female with past medical history of anxiety disorder, congestive heart failure, CKD, hypertension, class III obesity, hypothyroidism, paroxysmal atrial fibrillation and splenic infarct, presented to hospital brought in by EMS with complaints of right upper quadrant pain worse for the last 2 days intermittent in nature without any nausea or vomiting.  History of cholecystectomy in the past.  Patient denies any alcohol consumption.  Patient also complains of generalized weakness fatigue tiredness .  Patient was thought to have UTI and Keflex  was given and patient was plan was to discharge from the ED but then continued to have dyspnea and hypoxia so decision was to admit to the hospital.  On further interrogation, patient stated that she had been having dyspnea on exertion, orthopnea with increasing leg swelling for the last few days.  Patient denies any chest pain, or palpitation.  Denied any fever or chills but had some cough at home.  Denied any dizziness, lightheadedness or syncope.  Denied any urinary urgency, frequency or dysuria.  Denied any changes in bowel habits.  In the ED, patient was noted to have right upper quadrant tenderness.  WBC at 10.8.  Hemoglobin 15.5.  Creatinine at 1.0.  Urinalysis shows protein in urine.  CT scan of the abdomen and pelvis showed ascites with sigmoid diverticulosis and right middle lobe pulmonary nodule.  Patient received Keflex  morphine  Zofran  and and since that she was hypoxic she was considered for admission to the hospital for further evaluation and treatment.   Assessment and Plan Principal Problem:   CHF (congestive heart failure) (HCC) Active Problems:   Essential hypertension    Hypothyroidism   Paroxysmal atrial fibrillation (HCC)   Morbid obesity with BMI of 50.0-59.9, adult (HCC)  Acute hypoxic respiratory failure secondary to acute on chronic CHF exacerbation.   Increasing shortness of breath, hypoxia secondary to CHF exacerbation..  Significant elevation of proBNP with trace pleural effusion and lower extremity edema dyspnea on exertion suggestive of CHF.  Pulse ox between 72-80 percent on room air while she was ambulating. Needed 2 L of oxygen.  Still has difficulty completing sentences.  Has had increasing peripheral edema and orthopnea.  On 40 mg p.o. twice daily as outpatient but has not responded well at home.  Will continue with IV Lasix  40 milligram twice daily.  Will check 2D echocardiogram, strict intake and output charting, Daily weights.  Follow CHF protocol with fluid restriction.  Will hold losartan  for now, continue metoprolol .  2D echocardiogram reviewed from 12/08/2017 showed LV ejection fraction of 65 to 70% with normal wall motion.    Right upper quadrant pain.  Abnormal urinalysis noted but CT scan without any acute findings.  Keflex  had been prescribed from the ED.  Urinalysis showed 6-10 white cells with negative nitrate.  No further pain or nausea vomiting at this time.  Follow urine cultures.  Continue Keflex  for now.  Generalized weakness fatigue, deconditioned and debilitated.  Will get PT OT evaluation.  Has a walker at home at baseline.  Morbid obesity obesity   There is no height or weight on file to calculate BMI. Would benefit from lifestyle modification as outpatient.  History of paroxysmal atrial fibrillation Rate controlled at this time continue flecainide  metoprolol   and Eliquis  from home.  Patient does have history of loop recorder in the past.  Hypertension Hold losartan .  Continue metoprolol .  Continue to monitor blood pressure  Hypothyroidism.  Continue Synthroid .  TSH 2 months back was 2.2  CKD stage IIIa Will continue to  monitor BMP specially while on diuretics . 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   OA (osteoarthritis) of knee 04/04/2016   Paroxysmal atrial fibrillation (HCC) 09/08/2021   Tobacco user 09/08/2021   Transfusion history    '77 pt has positive antibodies history   Vitamin D  deficiency 09/08/2021   Past Surgical History:  Procedure Laterality Date   CARDIOVERSION N/A 02/16/2021   Procedure: CARDIOVERSION;  Surgeon: Fernande Elspeth BROCKS, MD;  Location: [facility] ORS;  Service: Cardiovascular;  Laterality: N/A;   CHOLECYSTECTOMY     DILATION AND CURETTAGE OF UTERUS     LOOP RECORDER INSERTION N/A 12/21/2017   Procedure: LOOP RECORDER INSERTION;  Surgeon: Fernande Elspeth BROCKS, MD;  Location: [facility] INV[facility]IVE CV [facility];  Service: Cardiovascular;  Laterality: N/A;   OMENTECTOMY N/A 10/12/2015   Procedure: PARTIAL OMENTECTOMY;  Surgeon: Dann Hummer, MD;  Location: [facility] OR;  Service: General;  Laterality: N/A;   TOTAL KNEE ARTHROPLASTY Left 04/04/2016   Procedure: LEFT TOTAL KNEE ARTHROPLASTY;  Surgeon: Dempsey Moan, MD;  Location: [facility] ORS;  Service: Orthopedics;  Laterality: Left;   TOTAL KNEE ARTHROPLASTY Right 08/01/2016   Procedure: TOTAL KNEE ARTHROPLASTY;  Surgeon: Dempsey Moan, MD;  Location: [facility] ORS;  Service: Orthopedics;  Laterality: Right;   UMBILICAL HERNIA REPAIR N/A 10/12/2015   Procedure: HERNIA REPAIR UMBILICAL  ADULT/INCARERATED;  Surgeon: Dann Hummer, MD;  Location[facility];  Service: General;  Laterality: N/A;    Social History:  reports that she quit smoking about 37 years ago. Her smoking use included cigarettes. She has never used smokeless tobacco. She reports that she does not drink alcohol and does not use drugs.  Allergies[1]  Family History  Problem Relation Age of Onset   Rheum arthritis Mother    Heart attack Father    Heart disease Father      Prior to Admission medications  Medication Sig Start Date End Date Taking? Authorizing Provider  acetaminophen  (TYLENOL ) 500 MG tablet Take 500-1,000 mg by mouth daily as needed for mild pain (pain score 1-3), headache or moderate pain (pain score 4-6).   Yes [provider]  apixaban  (ELIQUIS ) 5 MG TABS tablet TAKE 1 TABLET BY MOUTH 2 TIMES DAILY. 05/06/24  Yes Fernande Elspeth BROCKS, MD  clobetasol cream (TEMOVATE) 0.05 % Apply topically as needed (rash). 12/16/22  Yes [provider]  escitalopram  (LEXAPRO ) 5 MG tablet Take 5 mg by mouth daily. 12/28/20  Yes [provider]  flecainide  (TAMBOCOR ) 100 MG tablet Take 1 tablet (100 mg total) by mouth 2 (two) times daily. 05/21/24  Yes Riddle, Suzann, NP  furosemide  (LASIX ) 20 MG tablet  Take 40 mg by mouth 2 (two) times daily.   Yes [provider]  levothyroxine  (SYNTHROID ) 88 MCG tablet Take 88 mcg by mouth every morning. 04/08/22  Yes [provider]  losartan  (COZAAR ) 50 MG tablet Take 50 mg by mouth daily. 11/17/21  Yes [provider]  metoprolol  tartrate (LOPRESSOR ) 50 MG tablet Take 1 tablet (50 mg total) by mouth 2 (two) times daily. 05/21/24  Yes Riddle, Suzann, NP  omeprazole  (PRILOSEC) 20 MG capsule Take 1 capsule (20 mg total) by mouth 2 (two) times daily before a meal. Patient taking differently: Take 20 mg by mouth daily. 04/21/23  Yes Delo, Vicenta, MD  cephALEXin  (KEFLEX ) 500 MG capsule Take 1 capsule (500 mg total) by mouth 2 (two) times  daily. Patient not taking: Reported on 09/04/2024 09/04/24   Francis Ileana SAILOR, PA-C    Physical Exam:  Vitals:   09/04/24 9192 09/04/24 1147 09/04/24 1530 09/04/24 1545  BP:    122/72  Pulse:   (!) 50 (!) 47  Resp:   20 13  Temp:  98 F (36.7 C)    TempSrc:      SpO2: 100%  100% 99%   Wt Readings from Last 3 Encounters:  06/27/24 (!) 170 kg  05/21/24 (!) 170.1 kg  05/02/23 (!) 154.2 kg   There is no height or weight on file to calculate BMI.  General:  Obese built, not in obvious distress, nasal cannula oxygen, HENT: Normocephalic, No scleral pallor or icterus noted. Oral mucosa is moist.  Chest: Diminished breath sounds bilaterally no crackles or wheezes.  CVS: S1 &S2 heard. No murmur.  Regular rate and rhythm. Abdomen: Soft, nontender, nondistended.  Bowel sounds are heard. No abdominal mass palpated Extremities: No cyanosis, clubbing with trace edema Psych: Alert, awake and oriented, normal mood CNS:  No cranial nerve deficits.  Power equal in all extremities.   Skin: Warm and dry.  No rashes noted.  Labs on Admission:   CBC: Recent Labs  Lab 09/04/24 0026  WBC 10.8*  NEUTROABS 9.2*  HGB 15.5*  HCT 49.2*  MCV 92.1  PLT 207    Basic Metabolic Panel: Recent Labs  Lab 09/04/24 0026  NA 138  K 3.9  CL 95*  CO2 31  GLUCOSE 100*  BUN 25*  CREATININE 1.09*  CALCIUM  9.8    Liver Function Tests: Recent Labs  Lab 09/04/24 0026  AST 29  ALT 12  ALKPHOS 120  BILITOT 0.9  PROT 8.9*  ALBUMIN 4.1   Recent Labs  Lab 09/04/24 0026  LIPASE 48   No results for input(s): AMMONIA in the last 168 hours.  Cardiac Enzymes: No results for input(s): CKTOTAL, CKMB, CKMBINDEX, TROPONINI in the last 168 hours.  BNP (last 3 results) No results for input(s): BNP in the last 8760 hours.  ProBNP (last 3 results) Recent Labs    09/04/24 0848  PROBNP 2,538.0*    CBG: No results for input(s): GLUCAP in the last 168 hours.  Lipase     Component  Value Date/Time   LIPASE 48 09/04/2024 0026     Urinalysis    Component Value Date/Time   COLORURINE YELLOW 09/04/2024 0156   APPEARANCEUR CLOUDY (A) 09/04/2024 0156   LABSPEC 1.027 09/04/2024 0156   PHURINE 6.0 09/04/2024 0156   GLUCOSEU NEGATIVE 09/04/2024 0156   HGBUR MODERATE (A) 09/04/2024 0156   BILIRUBINUR NEGATIVE 09/04/2024 0156   KETONESUR NEGATIVE 09/04/2024 0156   PROTEINUR 100 (A) 09/04/2024 0156  NITRITE NEGATIVE 09/04/2024 0156   LEUKOCYTESUR TRACE (A) 09/04/2024 0156     Drugs of Abuse  No results found for: LABOPIA, COCAINSCRNUR, LABBENZ, AMPHETMU, THCU, LABBARB    Radiological Exams on Admission: DG Chest Port 1 View Result Date: 09/04/2024 EXAM: 1 VIEW(S) XRAY OF THE CHEST 09/04/2024 05:47:00 AM COMPARISON: 06/27/2024 CLINICAL HISTORY: The patient has a new oxygen requirement. FINDINGS: LINES, TUBES AND DEVICES: Cardiac loop recorder noted. LUNGS AND PLEURA: Mildly hypoinflated lungs. Mild diffuse pulmonary interstitial prominence, mildly increased from comparison. No focal pulmonary opacity. No pleural effusion. No pneumothorax. HEART AND MEDIASTINUM: Mild cardiomegaly. Aortic arch atherosclerosis. BONES AND SOFT TISSUES: No acute osseous abnormality. IMPRESSION: 1. Mild diffuse pulmonary interstitial prominence, mildly increased from comparison. 2. Mildly hypoinflated lungs. 3. Mild cardiomegaly and aortic arch atherosclerosis. Cardiac loop recorder noted. Electronically signed by: Evalene Coho MD 09/04/2024 06:08 AM EST RP Workstation: [id number]   CT ABDOMEN PELVIS W CONTRAST Result Date: 09/04/2024 EXAM: CT ABDOMEN AND PELVIS WITH CONTRAST 09/04/2024 01:19:31 AM TECHNIQUE: CT of the abdomen and pelvis was performed with the administration of intravenous contrast. Multiplanar reformatted images are provided for review. Automated exposure control, iterative reconstruction, and/or weight-based adjustment of the mA/kV was utilized to reduce the  radiation dose to as low as reasonably achievable. COMPARISON: 01/21/2024 CLINICAL HISTORY: Abdominal pain, acute, nonlocalized. FINDINGS: LOWER CHEST: Small hiatal hernia. Stable 5 mm subpleural pulmonary nodule within the visualized right middle lobe (series 3, image 5). New follow up imaging recommended. LIVER: Mild hepatic steatosis. GALLBLADDER AND BILE DUCTS: Status post cholecystectomy. No biliary ductal dilatation. SPLEEN: No acute abnormality. PANCREAS: No acute abnormality. ADRENAL GLANDS: No acute abnormality. KIDNEYS, URETERS AND BLADDER: No stones in the kidneys or ureters. No hydronephrosis. No perinephric or periureteral stranding. Urinary bladder is unremarkable. GI AND BOWEL: Severe sigmoid diverticulosis without superimposed acute inflammatory change. Scattered diverticular are seen throughout the remainder of the colon. The stomach, small bowel, and large bowel are otherwise unremarkable. Appendix normal. PERITONEUM AND RETROPERITONEUM: Interval development of mild ascites. No free air. VASCULATURE: Mild aortoiliac atherosclerotic calcification. No aortic aneurysm. Aorta is normal in caliber. LYMPH NODES: No lymphadenopathy. REPRODUCTIVE ORGANS: No acute abnormality. BONES AND SOFT TISSUES: Severe bilateral degenerative hip arthritis. Advanced degenerative changes are seen within the visualized thoracolumbar spine. No acute bone abnormality. No lytic or blastic bone lesion. No focal soft tissue abnormality. IMPRESSION: 1. Interval development of mild ascites, abnormal but nonspecific . 2. Severe sigmoid diverticulosis without acute inflammatory change, with scattered diverticula elsewhere in the colon. 3. Stable 5 mm subpleural right middle lobe pulmonary nodule; no routine follow-up imaging is recommended per Fleischner Society Guidelines. 4. Small hiatal hernia 5. mild hepatic steatosis 6. RAF score includes aortic atherosclerosis (ICD10-I70.0). Electronically signed by: Dorethia Molt MD  09/04/2024 01:28 AM EST RP Workstation: [id number]    EKG: Personally reviewed by me which shows wide QRS rhythm.   Consultant: None  Code Status: Full code  Microbiology none  Antibiotics: None  Family Communication:  Patients' condition and plan of care including tests being ordered have been discussed with the patient and  who indicate understanding and agree with the plan.   Status is: Inpatient   Severity of Illness: The appropriate patient status for this patient is INPATIENT. Inpatient status is judged to be reasonable and necessary in order to provide the required intensity of service to ensure the patient's safety. The patient's presenting symptoms, physical exam findings, and initial radiographic and laboratory data in the context of their chronic comorbidities  is felt to place them at high risk for further clinical deterioration. Furthermore, it is not anticipated that the patient will be medically stable for discharge from the hospital within 2 midnights of admission.   * I certify that at the point of admission it is my clinical judgment that the patient will require inpatient hospital care spanning beyond 2 midnights from the point of admission due to high intensity of service, high risk for further deterioration and high frequency of surveillance required.*  Signed, Vernal Alstrom, MD [facility] 09/04/2024         [1]  Allergies Allergen Reactions   Augmentin [Amoxicillin-Pot Clavulanate] Hives and Itching   Pneumococcal Vaccines Other (See Comments)    Caused fever, and swelling at injection site   "

## 2024-09-04 NOTE — ED Triage Notes (Signed)
 Pt just dc home for a UTI was pick up by son and pt fell before getting on her son's car. Pt on blood thinner didn't hit her head.

## 2024-09-04 NOTE — ED Notes (Signed)
 Patient transported to CT

## 2024-09-04 NOTE — TOC CM/SW Note (Signed)
 TOC consult received for d/c planning needs. Follow-up to be completed with patient as appropriate.   Merilee Batty, MSN, RN Case Management [phone]

## 2024-09-04 NOTE — ED Notes (Signed)
 Pt placed on 2L nasal canula d/t O2 89% after giving pain medication. Pt currently 100% O2

## 2024-09-04 NOTE — ED Provider Notes (Signed)
 " [place] EMERGENCY DEPARTMENT AT [facility] Provider Note   CSN: [id number] Arrival date & time: 09/03/24  2354     Patient presents with: Abdominal Pain   [name] is a 74 y.o. female. hx of AFIB, GERD, MDD, splenic infarct and morbid obesity present to the ED with abdominal pain. Patient reports 2 days of intermittent upper right quadrant pain. Patient has previously had a cholecystectomy. Patient denies N/V/D. No fevers or recent illness. Patient on daily Eliquis . No hx of GI bleed. No etoh or tobacco use. Patient denies chest pain or SOB.     Abdominal Pain      Prior to Admission medications  Medication Sig Start Date End Date Taking? Authorizing Provider  cephALEXin  (KEFLEX ) 500 MG capsule Take 1 capsule (500 mg total) by mouth 2 (two) times daily. 09/04/24  Yes Shena Vinluan N, PA-C  acetaminophen  (TYLENOL ) 500 MG tablet Take 1,000 mg by mouth every 6 (six) hours as needed for mild pain or headache.    [provider]  apixaban  (ELIQUIS ) 5 MG TABS tablet TAKE 1 TABLET BY MOUTH 2 TIMES DAILY. 05/06/24   Fernande Elspeth BROCKS, MD  clobetasol cream (TEMOVATE) 0.05 % Apply topically as needed (rash). 12/16/22   [provider]  escitalopram  (LEXAPRO ) 5 MG tablet Take 5 mg by mouth daily. 12/28/20   [provider]  flecainide  (TAMBOCOR ) 100 MG tablet Take 1 tablet (100 mg total) by mouth 2 (two) times daily. 05/21/24   Riddle, Suzann, NP  furosemide  (LASIX ) 20 MG tablet Take 40 mg by mouth every morning.     [provider]  levothyroxine  (SYNTHROID ) 88 MCG tablet Take 88 mcg by mouth every morning. 04/08/22   [provider]  losartan  (COZAAR ) 50 MG tablet Take 50 mg by mouth daily. 11/17/21   [provider]  metoprolol  tartrate (LOPRESSOR ) 50 MG tablet Take 1 tablet (50 mg total) by mouth 2 (two) times daily. 05/21/24   Riddle, Suzann, NP  omeprazole  (PRILOSEC) 20 MG capsule Take 1 capsule (20 mg total) by mouth 2 (two)  times daily before a meal. 04/21/23   Geroldine Berg, MD    Allergies: Augmentin [amoxicillin-pot clavulanate] and Pneumococcal vaccines    Review of Systems  Gastrointestinal:  Positive for abdominal pain.    Updated Vital Signs BP (!) 183/103   Pulse 70   Temp 98.5 F (36.9 C) (Oral)   Resp (!) 22   SpO2 96%   Physical Exam Constitutional:      Appearance: She is obese.  HENT:     Head: Normocephalic and atraumatic.     Mouth/Throat:     Mouth: Mucous membranes are moist.  Cardiovascular:     Rate and Rhythm: Normal rate and regular rhythm.     Heart sounds: Normal heart sounds.  Pulmonary:     Effort: Pulmonary effort is normal.  Abdominal:     General: There is no distension.     Palpations: Abdomen is soft. There is no mass.     Tenderness: There is abdominal tenderness in the right upper quadrant and left lower quadrant.  Skin:    General: Skin is warm and dry.     Capillary Refill: Capillary refill takes less than 2 seconds.  Neurological:     General: No focal deficit present.     Mental Status: She is alert.  Psychiatric:        Mood and Affect: Mood normal.     (all  labs ordered are listed, but only abnormal results are displayed) Labs Reviewed  COMPREHENSIVE METABOLIC PANEL WITH GFR - Abnormal; Notable for the following components:      Result Value   Chloride 95 (*)    Glucose, Bld 100 (*)    BUN 25 (*)    Creatinine, Ser 1.09 (*)    Total Protein 8.9 (*)    GFR, Estimated 53 (*)    All other components within normal limits  CBC WITH DIFFERENTIAL/PLATELET - Abnormal; Notable for the following components:   WBC 10.8 (*)    RBC 5.34 (*)    Hemoglobin 15.5 (*)    HCT 49.2 (*)    Neutro Abs 9.2 (*)    All other components within normal limits  URINALYSIS, ROUTINE W REFLEX MICROSCOPIC - Abnormal; Notable for the following components:   APPearance CLOUDY (*)    Hgb urine dipstick MODERATE (*)    Protein, ur 100 (*)    Leukocytes,Ua TRACE (*)     Bacteria, UA MANY (*)    All other components within normal limits  LIPASE, BLOOD    EKG: None  Radiology: CT ABDOMEN PELVIS W CONTRAST Result Date: 09/04/2024 EXAM: CT ABDOMEN AND PELVIS WITH CONTRAST 09/04/2024 01:19:31 AM TECHNIQUE: CT of the abdomen and pelvis was performed with the administration of intravenous contrast. Multiplanar reformatted images are provided for review. Automated exposure control, iterative reconstruction, and/or weight-based adjustment of the mA/kV was utilized to reduce the radiation dose to as low as reasonably achievable. COMPARISON: 01/21/2024 CLINICAL HISTORY: Abdominal pain, acute, nonlocalized. FINDINGS: LOWER CHEST: Small hiatal hernia. Stable 5 mm subpleural pulmonary nodule within the visualized right middle lobe (series 3, image 5). New follow up imaging recommended. LIVER: Mild hepatic steatosis. GALLBLADDER AND BILE DUCTS: Status post cholecystectomy. No biliary ductal dilatation. SPLEEN: No acute abnormality. PANCREAS: No acute abnormality. ADRENAL GLANDS: No acute abnormality. KIDNEYS, URETERS AND BLADDER: No stones in the kidneys or ureters. No hydronephrosis. No perinephric or periureteral stranding. Urinary bladder is unremarkable. GI AND BOWEL: Severe sigmoid diverticulosis without superimposed acute inflammatory change. Scattered diverticular are seen throughout the remainder of the colon. The stomach, small bowel, and large bowel are otherwise unremarkable. Appendix normal. PERITONEUM AND RETROPERITONEUM: Interval development of mild ascites. No free air. VASCULATURE: Mild aortoiliac atherosclerotic calcification. No aortic aneurysm. Aorta is normal in caliber. LYMPH NODES: No lymphadenopathy. REPRODUCTIVE ORGANS: No acute abnormality. BONES AND SOFT TISSUES: Severe bilateral degenerative hip arthritis. Advanced degenerative changes are seen within the visualized thoracolumbar spine. No acute bone abnormality. No lytic or blastic bone lesion. No focal soft  tissue abnormality. IMPRESSION: 1. Interval development of mild ascites, abnormal but nonspecific . 2. Severe sigmoid diverticulosis without acute inflammatory change, with scattered diverticula elsewhere in the colon. 3. Stable 5 mm subpleural right middle lobe pulmonary nodule; no routine follow-up imaging is recommended per Fleischner Society Guidelines. 4. Small hiatal hernia 5. mild hepatic steatosis 6. RAF score includes aortic atherosclerosis (ICD10-I70.0). Electronically signed by: Dorethia Molt MD 09/04/2024 01:28 AM EST RP Workstation: [id number]     Procedures   Medications Ordered in the ED  cephALEXin  (KEFLEX ) capsule 500 mg (has no administration in time range)  morphine  (PF) 4 MG/ML injection 4 mg (4 mg Intravenous Given 09/04/24 0040)  ondansetron  (ZOFRAN ) injection 4 mg (4 mg Intravenous Given 09/04/24 0039)  iohexol  (OMNIPAQUE ) 350 MG/ML injection 100 mL (100 mLs Intravenous Contrast Given 09/04/24 0112)  Medical Decision Making Amount and/or Complexity of Data Reviewed Labs: ordered. Radiology: ordered.  Risk Prescription drug management.   This patient presents to the ED for concern of abdominal pain differential diagnosis includes choledocholithiasis, acute cholecystitis, pancreatitis, appendicitis, SBO, diverticulitis, ulcerative colitis, viral GI illness   Lab Tests:  I Ordered, and personally interpreted labs.  The pertinent results include: Bun 25, mildly elevated creatinine 1.09, leukocytosis at 10.8, elevated hemoglobin at 15.5 likely in the setting of hemoconcentration, UA with moderate hemoglobin, trace leukocytes, many bacteria, 11-20 RBCs, 6-10 WBCs   Imaging Studies ordered:  I ordered imaging studies including CT abdomen pelvis with contrast I independently visualized and interpreted imaging which showed interval development of mild ascites, abnormal but nonspecific. I agree with the radiologist  interpretation   Medicines ordered and prescription drug management:  I ordered medication including morphine  and Zofran , keflex      I have reviewed the patients home medicines and have made adjustments as needed   Problem List / ED Course:  Considered for admission or further workup however patient's vital signs, physical exam, labs, and imaging are reassuring.  Patient was found to have a UTI will be treated with outpatient Keflex .  Patient given return precautions.  I feel patient is safe for discharge at this time.     Final diagnoses:  Urinary tract infection with hematuria, site unspecified  Abdominal pain, unspecified abdominal location    ED Discharge Orders          Ordered    cephALEXin  (KEFLEX ) 500 MG capsule  2 times daily        09/04/24 0245               Francis Ileana SAILOR, PA-C 09/04/24 9752    Jerral Meth, MD 09/04/24 0304  "

## 2024-09-04 NOTE — Discharge Instructions (Signed)
 Today you are seen for abdominal pain.  You are found to have a UTI and it been prescribed Keflex .  Please pick up your medication and take as prescribed.  Thank you for letting us  treat you today. After reviewing your labs and imaging, I feel you are safe to go home. Please follow up with your PCP in the next several days and provide them with your records from this visit. Return to the Emergency Room if pain becomes severe or symptoms worsen.

## 2024-09-04 NOTE — Hospital Course (Addendum)
 Mrs. [name] was admitted to the hospital with the working diagnosis of acute on chronic heart failure exacerbation.   74 year old female with past medical history of anxiety disorder, heart failure, CKD, HTN, obesity, hypothyroidism, paroxysmal A-fib who presented to the ED for right upper quadrant abdominal pain.  Patient reported 2 days of intermittent abdominal pain, associated with generalized weakness, lower extremity edema, orthopnea and dyspnea on exertion.  She also reported to have injury to the left thigh prior to admission. Because persistent and severe pain she called EMS, then she was transported to the ED.  On her initial physical examination her blood pressure was 122/72, HR 50, RR 20 and 02 saturation 99%  Lungs with decreased sounds bilaterally, with no rales or wheezing, heart with S1 and S2 present and regular, abdomen with no distention and positive lower extremity edema.  Left thigh with positive ecchymosis.   Na 138, K 3.9 Cl 95 bicarbonate 31 glucose 100 bun 25 cr 1,0 Wbc 10.1 hgb 15.5 plt 207  Urine analysis SG 1,027, protein 100, trace leukocytes and moderate hgb, wbc 6-10 ad RBC 11-20   Chest radiograph with hypoinflation and right rotation, with bilateral vascular congestion, no effusions or infiltrates.   EKG 66 bpm, left axis deviation, right bundle branch block, qtc 516, sinus rhythm with poor RR wave progression, with no significant ST segment or T wave changes.   CT abdomen and pelvis interval development of mild ascites, abnormal but nonspecific.  Severe sigmoid diverticulosis with no acute inflammatory changes.  Stable 5 mm subpleural right middle lobe pulmonary nodule, no routine follow up is recommended.  Mild hepatic steatosis   Patient was placed on IV furosemide  for diuresis   Hospital course complicated by anemia, with hemoglobin dropping down to 7.4 for which she received 1 unit PRBC and her Eliquis  was held.  CT of the left thigh showed 500 mL  collection of blood in her limb.  Hemoglobin has now stabilized, and eliquis  resumed   01/22 pending placement. Insurance declined SNF coverage.  01/23 patient has decided to go home with home health services.

## 2024-09-04 NOTE — ED Notes (Signed)
 RN and NT assisted pt to restroom

## 2024-09-04 NOTE — ED Notes (Signed)
 [facility] hospitalist notified of patient heart rate dropping to 47 with 50 mg metoprolol  due at this time. Verbal order to recheck heart rate in 1 hour and hold medication if less than 60 bpm obtained.

## 2024-09-04 NOTE — ED Provider Triage Note (Signed)
 Emergency Medicine Provider Triage Evaluation Note  [name] , a 74 y.o. female  was evaluated in triage.  Patient was discharged. Family was trying to help patient into the car when patient was unable to transfer and was assisted to the ground. Patient did not fall and was not injured. The fire department was called to assist patient back into a wheelchair and back into the department. Family at bedside who assists with history as above.   O2 sat 70% on RA on arrival to triage. Improves to 91% on 2L.   Review of Systems  Positive:  Negative:   Physical Exam  BP (!) 153/74 (BP Location: Left Wrist)   Pulse 69   Temp 98.3 F (36.8 C) (Oral)   Resp 20  Gen:   Awake, no distress   Resp:  Normal effort  MSK:   Moves extremities without difficulty  Other:    Medical Decision Making  Medically screening exam initiated at 5:06 AM.  Appropriate orders placed.  [name] was informed that the remainder of the evaluation will be completed by another provider, this initial triage assessment does not replace that evaluation, and the importance of remaining in the ED until their evaluation is complete.     Beverley Leita LABOR, PA-C 09/04/24 0507

## 2024-09-05 ENCOUNTER — Inpatient Hospital Stay (HOSPITAL_COMMUNITY)

## 2024-09-05 DIAGNOSIS — N179 Acute kidney failure, unspecified: Secondary | ICD-10-CM | POA: Diagnosis not present

## 2024-09-05 DIAGNOSIS — D62 Acute posthemorrhagic anemia: Secondary | ICD-10-CM

## 2024-09-05 DIAGNOSIS — I5031 Acute diastolic (congestive) heart failure: Secondary | ICD-10-CM | POA: Diagnosis not present

## 2024-09-05 DIAGNOSIS — I5033 Acute on chronic diastolic (congestive) heart failure: Secondary | ICD-10-CM | POA: Diagnosis not present

## 2024-09-05 DIAGNOSIS — I509 Heart failure, unspecified: Secondary | ICD-10-CM | POA: Diagnosis not present

## 2024-09-05 LAB — DIC (DISSEMINATED INTRAVASCULAR COAGULATION)PANEL
D-Dimer, Quant: 8.24 ug{FEU}/mL — ABNORMAL HIGH (ref 0.00–0.50)
Fibrinogen: 721 mg/dL — ABNORMAL HIGH (ref 210–475)
INR: 1.7 — ABNORMAL HIGH (ref 0.8–1.2)
Platelets: 218 K/uL (ref 150–400)
Prothrombin Time: 20.4 s — ABNORMAL HIGH (ref 11.4–15.2)
Smear Review: NONE SEEN
aPTT: 34 s (ref 24–36)

## 2024-09-05 LAB — HEMOGLOBIN AND HEMATOCRIT, BLOOD
HCT: 35.5 % — ABNORMAL LOW (ref 36.0–46.0)
HCT: 33.4 % — ABNORMAL LOW (ref 36.0–46.0)
Hemoglobin: 10.8 g/dL — ABNORMAL LOW (ref 12.0–15.0)
Hemoglobin: 10.3 g/dL — ABNORMAL LOW (ref 12.0–15.0)

## 2024-09-05 LAB — ECHOCARDIOGRAM COMPLETE
Area-P 1/2: 2.63 cm2
Height: 67 in
S' Lateral: 3 cm
Weight: 6074.11 [oz_av]

## 2024-09-05 LAB — BASIC METABOLIC PANEL WITH GFR
Anion gap: 9 (ref 5–15)
BUN: 32 mg/dL — ABNORMAL HIGH (ref 8–23)
CO2: 33 mmol/L — ABNORMAL HIGH (ref 22–32)
Calcium: 8.4 mg/dL — ABNORMAL LOW (ref 8.9–10.3)
Chloride: 97 mmol/L — ABNORMAL LOW (ref 98–111)
Creatinine, Ser: 1.81 mg/dL — ABNORMAL HIGH (ref 0.44–1.00)
GFR, Estimated: 29 mL/min — ABNORMAL LOW
Glucose, Bld: 105 mg/dL — ABNORMAL HIGH (ref 70–99)
Potassium: 4.4 mmol/L (ref 3.5–5.1)
Sodium: 138 mmol/L (ref 135–145)

## 2024-09-05 LAB — CBC
HCT: 34.7 % — ABNORMAL LOW (ref 36.0–46.0)
Hemoglobin: 10.6 g/dL — ABNORMAL LOW (ref 12.0–15.0)
MCH: 29.2 pg (ref 26.0–34.0)
MCHC: 30.5 g/dL (ref 30.0–36.0)
MCV: 95.6 fL (ref 80.0–100.0)
Platelets: 218 K/uL (ref 150–400)
RBC: 3.63 MIL/uL — ABNORMAL LOW (ref 3.87–5.11)
RDW: 14.6 % (ref 11.5–15.5)
WBC: 11.7 K/uL — ABNORMAL HIGH (ref 4.0–10.5)
nRBC: 0 % (ref 0.0–0.2)

## 2024-09-05 LAB — URINE CULTURE

## 2024-09-05 LAB — GLOBAL TEG PANEL
CFF Max Amplitude: 51 mm — ABNORMAL HIGH (ref 15–32)
CK with Heparinase (R): 5.6 min (ref 4.3–8.3)
Citrated Functional Fibrinogen: 930.7 mg/dL — ABNORMAL HIGH (ref 278–581)
Citrated Kaolin (K): 0.8 min (ref 0.8–2.1)
Citrated Kaolin (MA): 71.3 mm — ABNORMAL HIGH (ref 52–69)
Citrated Kaolin (R): 7.3 min (ref 4.6–9.1)
Citrated Kaolin Angle: 80.6 deg — ABNORMAL HIGH (ref 63–78)
Citrated Rapid TEG (MA): 72.2 mm — ABNORMAL HIGH (ref 52–70)

## 2024-09-05 LAB — MAGNESIUM: Magnesium: 2.3 mg/dL (ref 1.7–2.4)

## 2024-09-05 MED ORDER — PERFLUTREN LIPID MICROSPHERE
1.0000 mL | INTRAVENOUS | Status: AC | PRN
  Administered 2024-09-05: 2 mL via INTRAVENOUS

## 2024-09-05 NOTE — Plan of Care (Signed)
  Problem: Clinical Measurements: Goal: Ability to maintain clinical measurements within normal limits will improve Outcome: Progressing Goal: Diagnostic test results will improve Outcome: Progressing   Problem: Pain Managment: Goal: General experience of comfort will improve and/or be controlled Outcome: Progressing   Problem: Safety: Goal: Ability to remain free from injury will improve Outcome: Progressing

## 2024-09-05 NOTE — TOC Progression Note (Addendum)
 Transition of Care [facility]) - Progression Note    Patient Details  Name: [name] MRN: [id number] Date of Birth: June 24, 1951  Transition of [facility]) CM/SW Contact  [name] [place], [place] Phone Number: 09/05/2024, 3:55 PM  Clinical Narrative: SW addressed social connections SDOH. Resources given to pt and on AVS. Per PT, pt refusing SNF.  Frieze Gwenn, MSW, LCSW [phone] (coverage)                        Expected Discharge Plan and Services                                               Social Drivers of Health (SDOH) Interventions SDOH Screenings   Food Insecurity: No Food Insecurity (09/05/2024)  Housing: Low Risk (09/05/2024)  Transportation Needs: No Transportation Needs (09/05/2024)  Utilities: Not At Risk (09/05/2024)  Social Connections: Socially Isolated (09/05/2024)  Tobacco Use: Medium Risk (09/04/2024)  Health Literacy: Adequate Health Literacy (05/31/2023)    Readmission Risk Interventions     No data to display

## 2024-09-05 NOTE — Discharge Instructions (Signed)

## 2024-09-05 NOTE — ED Notes (Addendum)
 Called to pt's room with c/o severe pain to left thigh and feeling like it was hard.  Pt noted to have large area of new bruising to thigh as well as tightness and warmth to area.  Pt states she had pain in it last night when they rolled her and that her laeg may have been against the side rail at the time.  Upon further discussion, this may also be an area that was injured during her fall yesterday.  Dr. Soledad notified of above, new orders received, blood thinners held. See media for photo of area.

## 2024-09-05 NOTE — Evaluation (Signed)
 Physical Therapy Evaluation Patient Details Name: [name] MRN: [id number] DOB: Apr 07, 1951 Today's Date: 09/05/2024  History of Present Illness  74 y.o. female presents to [facility] 09/04/24 with R upper quadrant pain, generalized weakness/fatigue, and SOB. CT scan showed ascites w/ sigmoid diverticulosis and R middle lobe pulmonary nodule. Also with acute hypoxic respiratory failure 2/2 acute on chronic CHF exacerbation, +UTI. PMHx: anxiety disorder, congestive heart failure, CKD, hypertension, class III obesity, hypothyroidism, paroxysmal atrial fibrillation and splenic infarct   Clinical Impression  PTA pt was ModI with use of rollator and would sleep in a lift chair. Limited eval as pt was on a narrow stretcher bed and declined OOB mobility due to pain in L thigh and thoracic spine. Pt presents with L>R LE weakness and decreased activity tolerance. Pt was able to pull into long-sitting with MinAx2 and use of bilateral handrails. Pt has intermittent assist available upon d/c home. Recommending <3hrs post acute rehab, however, pt currently declining. If pt continues to decline, would recommend 24/7 assist and HHPT. Will continue to follow.         If plan is discharge home, recommend the following: A lot of help with walking and/or transfers;A lot of help with bathing/dressing/bathroom;Assistance with cooking/housework;Assist for transportation;Help with stairs or ramp for entrance   Can travel by private vehicle   No    Equipment Recommendations Rolling walker (2 wheels);Wheelchair (measurements PT);Wheelchair cushion (measurements PT);BSC/3in1 (bariatric)     Functional Status Assessment Patient has had a recent decline in their functional status and demonstrates the ability to make significant improvements in function in a reasonable and predictable amount of time.     Precautions / Restrictions Precautions Precautions: Fall Recall of Precautions/Restrictions:  Intact Restrictions Weight Bearing Restrictions Per Provider Order: No      Mobility  Bed Mobility Overal bed mobility: Needs Assistance Bed Mobility: Supine to Sit    Supine to sit: +2 for physical assistance, Min assist    General bed mobility comments: assist to come forward on stretcher with pt pulling on rails to assess back pain    Transfers    General transfer comment: deferred due to safety concerns with pt on stretcher       Balance Overall balance assessment: Needs assistance, History of Falls       Pertinent Vitals/Pain Pain Assessment Pain Assessment: Faces Faces Pain Scale: Hurts even more Pain Location: L thigh, thoracic spine Pain Descriptors / Indicators: Aching, Grimacing, Guarding, Discomfort Pain Intervention(s): Limited activity within patient's tolerance, Monitored during session, Repositioned    Home Living Family/patient expects to be discharged to:: Private residence Living Arrangements: Children (son) Available Help at Discharge: Family;Available PRN/intermittently Type of Home: House Home Access: Stairs to enter;Ramped entrance    Home Layout: One level Home Equipment: Patent Examiner (4 wheels);Grab bars - toilet;Lift chair;Adaptive equipment      Prior Function Prior Level of Function : History of Falls (last six months)    Mobility Comments: ModI with rollator. Needs a stool to get into the car. Fall prior to admit when attempting to get in the car w/o stool, sleeps in lift chair. ADLs Comments: assisted for LB ADLs by her son despite having AE, able to prepare light meals and perform light housekeeping     Extremity/Trunk Assessment   Upper Extremity Assessment Upper Extremity Assessment: Defer to OT evaluation    Lower Extremity Assessment Lower Extremity Assessment: RLE deficits/detail;LLE deficits/detail RLE Deficits / Details: bruising over lateral thigh, indententation near lateral R fibula  from prior injury. Pain  with fair quad contraction, slight knee flexion, and attempt at SLR. Unable to lift R LE off stretcher RLE Sensation: WNL LLE Deficits / Details: Hip flexion 2+/5, Knee ext 2+/5, Ankle DF 3/5 LLE Sensation: WNL    Cervical / Trunk Assessment Cervical / Trunk Assessment: Kyphotic;Other exceptions (obese, weakness)  Communication   Communication Communication: No apparent difficulties    Cognition Arousal: Alert Behavior During Therapy: WFL for tasks assessed/performed   PT - Cognitive impairments: Safety/Judgement, Problem solving, Sequencing    Following commands: Intact       Cueing Cueing Techniques: Verbal cues, Tactile cues     General Comments General comments (skin integrity, edema, etc.): VSS on RA     PT Assessment Patient needs continued PT services  PT Problem List Decreased strength;Decreased activity tolerance;Decreased balance;Decreased mobility;Decreased safety awareness;Obesity       PT Treatment Interventions DME instruction;Gait training;Functional mobility training;Therapeutic activities;Therapeutic exercise;Balance training;Neuromuscular re-education;Patient/family education;Wheelchair mobility training    PT Goals (Current goals can be found in the Care Plan section)  Acute Rehab PT Goals Patient Stated Goal: to go home PT Goal Formulation: With patient Time For Goal Achievement: 09/19/24 Potential to Achieve Goals: Good    Frequency Min 2X/week     Co-evaluation   Reason for Co-Treatment: Complexity of the patient's impairments (multi-system involvement);Necessary to address cognition/behavior during functional activity;For patient/therapist safety PT goals addressed during session: Mobility/safety with mobility;Balance;Proper use of DME OT goals addressed during session: ADL's and self-care       AM-PAC PT 6 Clicks Mobility  Outcome Measure Help needed turning from your back to your side while in a flat bed without using bedrails?:  Total Help needed moving from lying on your back to sitting on the side of a flat bed without using bedrails?: Total Help needed moving to and from a bed to a chair (including a wheelchair)?: Total Help needed standing up from a chair using your arms (e.g., wheelchair or bedside chair)?: Total Help needed to walk in hospital room?: Total Help needed climbing 3-5 steps with a railing? : Total 6 Click Score: 6    End of Session Equipment Utilized During Treatment: Gait belt;Oxygen Activity Tolerance: Patient limited by pain Patient left: in bed;with call bell/phone within reach Nurse Communication: Mobility status PT Visit Diagnosis: Unsteadiness on feet (R26.81);Other abnormalities of gait and mobility (R26.89);Muscle weakness (generalized) (M62.81);History of falling (Z91.81)    Time: 9069-9042 PT Time Calculation (min) (ACUTE ONLY): 27 min   Charges:   PT Evaluation $PT Eval Low Complexity: 1 Low   PT General Charges $$ ACUTE PT VISIT: 1 Visit        Kate ORN, PT, DPT Secure Chat Preferred  Rehab Office [phone]   Kate BRAVO [name] 09/05/2024, 10:18 AM

## 2024-09-05 NOTE — Progress Notes (Signed)
" °  Echocardiogram 2D Echocardiogram has been attempted, pt not in room  Tinnie FORBES Gosling RDCS 09/05/2024, 11:20 AM "

## 2024-09-05 NOTE — Progress Notes (Signed)
 " Progress Note   Patient: [name] FMW:[id number] DOB: 15-May-1951 DOA: 09/04/2024     1 DOS: the patient was seen and examined on 09/05/2024   Brief hospital course: The patient is a 74 yr old woman who presented to [facility] ED on 09/04/2024 with complaints of right upper quadrant pain intermittently, weakness, fatigue. Initially it was thought that the patient's symptoms were due to UTI, but while still in the ED, prior to discharge the patient became hypoxic. The patient then revealed that she has had DOE, orthopnea, and lower extremity edema for the past few days.   In the ED the UA demonstrated proteinuria. CT of the abdomen and pelvis demonstrated ascites and a right middle lobe pulmonary nodule. BNP was elevated at 2538. Creatinine was 1.81. Hemoglobin upon presentation was 15.5. By 09/05/2024 the patient's hemoglobin had dropped to 10.8. CT of the patient's left thigh reveals a 500 cc collection of blood in the limb. The patient suffered injury to that extremity prior to presentation. She is on Eliquis  for her history of paroxysmal atrial fibrillation. Her last dose was on the evening of 09/04/2024. It is currently held.   The patient's hemoglobin is being monitored carefully. She is agreeable to transfusion for hemoglobin of less than 7.0. She does not demonstrate any signs of shock.  Assessment and Plan: Principal Problem:   CHF (congestive heart failure) (HCC) Active Problems:   Essential hypertension   Hypothyroidism   Paroxysmal atrial fibrillation (HCC)   Morbid obesity with BMI of 50.0-59.9, adult (HCC)   Acute hypoxic respiratory failure secondary to acute on chronic CHF exacerbation.   Increasing shortness of breath, hypoxia secondary to CHF exacerbation..  Significant elevation of proBNP with trace pleural effusion and lower extremity edema dyspnea on exertion suggestive of CHF.  Pulse ox between 72-80 percent on room air while she was ambulating. Needed 2 L of oxygen.  Still has  difficulty completing sentences.  Has had increasing peripheral edema and orthopnea.  On 40 mg p.o. twice daily as outpatient but has not responded well at home.  Will continue with IV Lasix  40 milligram twice daily.  Will check 2D echocardiogram, strict intake and output charting, Daily weights.  Follow CHF protocol with fluid restriction.  Will hold losartan  for now, continue metoprolol .  2D echocardiogram reviewed from 12/08/2017 showed LV ejection fraction of 65 to 70% with normal wall motion.  Continue to diurese.  Anemia/hematoma: Hemoglobin upon presentation was 15.5. By 09/05/2024 the patient's hemoglobin had dropped to 10.8. CT of the patient's left thigh reveals a 500 cc collection of blood in the limb. The patient suffered injury to that extremity prior to presentation. She is on Eliquis  for her history of paroxysmal atrial fibrillation. Her last dose was on the evening of 09/04/2024. It is currently held.   Right upper quadrant pain.  Abnormal urinalysis noted but CT scan without any acute findings.  Keflex  had been prescribed from the ED.  Urinalysis showed 6-10 white cells with negative nitrate.  No further pain or nausea vomiting at this time.  Follow urine cultures.  Continue Keflex  for now. CT abdomen and pelvis demonstrates the development of ascites, a stable 5 mm subpleural right middle lobe pulmonary nodule ( follow up necessary). Mild hepatic steatosis, a small hiatal hernia.    Generalized weakness fatigue, deconditioned and debilitated.  Will get PT OT evaluation.  Has a walker at home at baseline.   Morbid obesity obesity   There is no height or weight  on file to calculate BMI. Would benefit from lifestyle modification as outpatient.   History of paroxysmal atrial fibrillation Rate controlled at this time continue flecainide  metoprolol  home.  The patient's last dose of Eliquis  was on the evening of 09/04/2024. It has been held due to the acute blood loss anemia and the left thigh  hematoma. Patient does have history of loop recorder in the past.   Hypertension Hold losartan .  Continue metoprolol .  Continue to monitor blood pressure   Hypothyroidism.  Continue Synthroid .  TSH 2 months back was 2.2   AKI on CKD stage IIIa Creatinine upon presentation was 1.09. With diuresis and blood loss her creatinine has increased to 1.81. It appears that her baseline creatinine  is 1.60 in the last 12 months. Continue to monitor. Avoid nephrotoxic substances. Monitor electrolytes. . DVT Prophylaxis: Eliquis        Subjective: The patient is resting comfortably. No new complaints.   Physical Exam: Vitals:   09/05/24 0830 09/05/24 1000 09/05/24 1100 09/05/24 1320  BP: 117/74 138/64 (!) 123/58 (!) 113/49  Pulse: (!) 55 (!) 57 (!) 58 65  Resp: 15 15 16 13   Temp:   97.8 F (36.6 C) 98.5 F (36.9 C)  TempSrc:   Oral Oral  SpO2: 100% 100% 100% 100%  Weight:    (!) 172.2 kg  Height:    5' 7 (1.702 m)   Exam:  Constitutional:  The patient is awake, alert, and oriented x 3. No acute distress. Respiratory:  No increased work of breathing. No wheezes, rales, or rhonchi No tactile fremitus Cardiovascular:  Regular rate and rhythm No murmurs, ectopy, or gallups. No lateral PMI. No thrills. Abdomen:  Abdomen is soft, non-tender, non-distended No hernias, masses, or organomegaly Normoactive bowel sounds.  Musculoskeletal:  No cyanosis, clubbing, or edema The patient's lateral left thigh demonstrates a large hematoma that is very warm to touch.  Skin:  No rashes, lesions, ulcers palpation of skin: no induration or nodules Neurologic:  CN 2-12 intact Sensation all 4 extremities intact Psychiatric:  Mental status Mood, affect appropriate Orientation to person, place, time  judgment and insight appear intact  Data Reviewed:  CBC, BMP, CT left thigh.  Family Communication: None available  Disposition: Status is: Inpatient Remains inpatient appropriate  because: Worsening anemia, worsening creatinine, lower extremity edema.  Planned Discharge Destination: Home    Time spent: 42 minutes  Author: Brigida Bureau, DO 09/05/2024 6:48 PM  For on call review [email or web address].  "

## 2024-09-05 NOTE — Evaluation (Signed)
 Occupational Therapy Evaluation Patient Details Name: [name] MRN: [id number] DOB: 1950-10-21 Today's Date: 09/05/2024   History of Present Illness   74 y.o. female presents to [facility] 09/04/24 with R upper quadrant pain, generalized weakness/fatigue, and SOB. CT scan showed ascites w/ sigmoid diverticulosis and R middle lobe pulmonary nodule. Also with acute hypoxic respiratory failure 2/2 acute on chronic CHF exacerbation, +UTI. PMHx: anxiety disorder, congestive heart failure, CKD, hypertension, class III obesity, hypothyroidism, paroxysmal atrial fibrillation and splenic infarct     Clinical Impressions Pt lives with her son who works. He assists with IADLs and LB ADLs. Pt walks with a rollator, sleeps in her lift chair and sponge bathes primarily. Presents with generalized weakness, L thigh and thoracic pain. Limited session due to pt being evaluated on a stretcher Patient will benefit from continued inpatient follow up therapy, <3 hours/day, pt is currently refusing.      If plan is discharge home, recommend the following:   Two people to help with walking and/or transfers;A lot of help with bathing/dressing/bathroom;Assistance with cooking/housework;Assist for transportation;Help with stairs or ramp for entrance     Functional Status Assessment   Patient has had a recent decline in their functional status and demonstrates the ability to make significant improvements in function in a reasonable and predictable amount of time.     Equipment Recommendations   None recommended by OT     Recommendations for Other Services         Precautions/Restrictions   Precautions Precautions: Fall Recall of Precautions/Restrictions: Intact Restrictions Weight Bearing Restrictions Per Provider Order: No     Mobility Bed Mobility Overal bed mobility: Needs Assistance Bed Mobility: Supine to Sit     Supine to sit: +2 for physical assistance, Min assist     General  bed mobility comments: assist to come forward on stretcher with pt pulling on rails to assess back pain    Transfers                   General transfer comment: deferred due to safety concerns with pt on stretcher      Balance                                           ADL either performed or assessed with clinical judgement   ADL Overall ADL's : Needs assistance/impaired Eating/Feeding: Independent;Bed level   Grooming: Set up;Bed level   Upper Body Bathing: Moderate assistance;Bed level   Lower Body Bathing: Total assistance;Bed level   Upper Body Dressing : Minimal assistance;Bed level   Lower Body Dressing: Total assistance;Bed level       Toileting- Clothing Manipulation and Hygiene: Total assistance;Bed level               Vision Baseline Vision/History: 1 Wears glasses Ability to See in Adequate Light: 0 Adequate Patient Visual Report: No change from baseline       Perception         Praxis         Pertinent Vitals/Pain Pain Assessment Pain Assessment: Faces Faces Pain Scale: Hurts even more Pain Location: L thigh, thoracic spine Pain Descriptors / Indicators: Aching, Grimacing, Guarding, Discomfort Pain Intervention(s): Limited activity within patient's tolerance, Repositioned     Extremity/Trunk Assessment Upper Extremity Assessment Upper Extremity Assessment: Generalized weakness;Right hand dominant   Lower Extremity Assessment Lower Extremity Assessment: Defer  to PT evaluation RLE Deficits / Details: bruising over lateral thigh, indententation near lateral R fibula. Pain with quad contraciotn, knee flexion,and SLR   Cervical / Trunk Assessment Cervical / Trunk Assessment: Kyphotic;Other exceptions (obese, weakness)   Communication Communication Communication: No apparent difficulties   Cognition Arousal: Alert Behavior During Therapy: WFL for tasks assessed/performed Cognition: No apparent impairments              OT - Cognition Comments: some word finding deficits                 Following commands: Intact       Cueing  General Comments   Cueing Techniques: Verbal cues;Tactile cues      Exercises     Shoulder Instructions      Home Living Family/patient expects to be discharged to:: Private residence Living Arrangements: Children (son) Available Help at Discharge: Family;Available PRN/intermittently Type of Home: House Home Access: Stairs to enter;Ramped entrance     Home Layout: One level     Bathroom Shower/Tub: Sponge bathes at baseline;Walk-in shower   Bathroom Toilet: Standard (with a riser) Bathroom Accessibility: Yes How Accessible: Accessible via walker Home Equipment: Toilet riser;Rollator (4 wheels);Grab bars - toilet;Lift chair;Adaptive equipment Adaptive Equipment: Reacher;Sock aid        Prior Functioning/Environment Prior Level of Function : History of Falls (last six months)             Mobility Comments: ModI with rollator. Needs a stool to get into the car. Fall prior to admit when attempting to get in the car w/o stool, sleeps in lift chair. ADLs Comments: assisted for LB ADLs by her son despite having AE, able to prepare light meals and perform light housekeeping    OT Problem List: Decreased strength;Pain;Decreased knowledge of use of DME or AE;Obesity   OT Treatment/Interventions: Self-care/ADL training;DME and/or AE instruction;Therapeutic activities;Patient/family education;Balance training;Therapeutic exercise      OT Goals(Current goals can be found in the care plan section)   Acute Rehab OT Goals OT Goal Formulation: With patient Time For Goal Achievement: 09/19/24 Potential to Achieve Goals: Fair ADL Goals Pt Will Perform Grooming: with supervision;standing Pt Will Perform Lower Body Bathing: with supervision;with adaptive equipment;sit to/from stand Pt Will Perform Lower Body Dressing: with supervision;with  adaptive equipment;sit to/from stand Pt Will Transfer to Toilet: with supervision;ambulating;bedside commode Pt Will Perform Toileting - Clothing Manipulation and hygiene: with supervision;sit to/from stand Pt/caregiver will Perform Home Exercise Program: Increased strength;Both right and left upper extremity;Independently;With written HEP provided   OT Frequency:  Min 2X/week    Co-evaluation PT/OT/SLP Co-Evaluation/Treatment: Yes Reason for Co-Treatment: Complexity of the patient's impairments (multi-system involvement);Necessary to address cognition/behavior during functional activity;For patient/therapist safety PT goals addressed during session: Mobility/safety with mobility;Balance;Proper use of DME OT goals addressed during session: ADL's and self-care      AM-PAC OT 6 Clicks Daily Activity     Outcome Measure Help from another person eating meals?: None Help from another person taking care of personal grooming?: A Little Help from another person toileting, which includes using toliet, bedpan, or urinal?: Total Help from another person bathing (including washing, rinsing, drying)?: A Lot Help from another person to put on and taking off regular upper body clothing?: A Little Help from another person to put on and taking off regular lower body clothing?: Total 6 Click Score: 14   End of Session Equipment Utilized During Treatment: Oxygen (2L) Nurse Communication: Other (comment) (pain in thoracic spine)  Activity Tolerance:  Patient tolerated treatment well Patient left: in bed;with call bell/phone within reach  OT Visit Diagnosis: Muscle weakness (generalized) (M62.81);Pain                Time: 9067-9042 OT Time Calculation (min): 25 min Charges:  OT General Charges $OT Visit: 1 Visit OT Evaluation $OT Eval Moderate Complexity: 1 Mod  [name], [name] Acute Rehabilitation Services Office: [phone]   [name] 09/05/2024, 10:06 AM

## 2024-09-06 DIAGNOSIS — I5033 Acute on chronic diastolic (congestive) heart failure: Secondary | ICD-10-CM

## 2024-09-06 DIAGNOSIS — D62 Acute posthemorrhagic anemia: Secondary | ICD-10-CM | POA: Diagnosis not present

## 2024-09-06 LAB — COMPREHENSIVE METABOLIC PANEL WITH GFR
ALT: 10 U/L (ref 0–44)
AST: 18 U/L (ref 15–41)
Albumin: 2.9 g/dL — ABNORMAL LOW (ref 3.5–5.0)
Alkaline Phosphatase: 70 U/L (ref 38–126)
Anion gap: 8 (ref 5–15)
BUN: 45 mg/dL — ABNORMAL HIGH (ref 8–23)
CO2: 33 mmol/L — ABNORMAL HIGH (ref 22–32)
Calcium: 8.3 mg/dL — ABNORMAL LOW (ref 8.9–10.3)
Chloride: 96 mmol/L — ABNORMAL LOW (ref 98–111)
Creatinine, Ser: 2.43 mg/dL — ABNORMAL HIGH (ref 0.44–1.00)
GFR, Estimated: 20 mL/min — ABNORMAL LOW
Glucose, Bld: 105 mg/dL — ABNORMAL HIGH (ref 70–99)
Potassium: 4.4 mmol/L (ref 3.5–5.1)
Sodium: 137 mmol/L (ref 135–145)
Total Bilirubin: 0.4 mg/dL (ref 0.0–1.2)
Total Protein: 5.8 g/dL — ABNORMAL LOW (ref 6.5–8.1)

## 2024-09-06 LAB — CBC WITH DIFFERENTIAL/PLATELET
Abs Immature Granulocytes: 0.05 K/uL (ref 0.00–0.07)
Basophils Absolute: 0.1 K/uL (ref 0.0–0.1)
Basophils Relative: 1 %
Eosinophils Absolute: 0.2 K/uL (ref 0.0–0.5)
Eosinophils Relative: 2 %
HCT: 26.3 % — ABNORMAL LOW (ref 36.0–46.0)
Hemoglobin: 8.2 g/dL — ABNORMAL LOW (ref 12.0–15.0)
Immature Granulocytes: 1 %
Lymphocytes Relative: 12 %
Lymphs Abs: 1.3 K/uL (ref 0.7–4.0)
MCH: 29 pg (ref 26.0–34.0)
MCHC: 31.2 g/dL (ref 30.0–36.0)
MCV: 92.9 fL (ref 80.0–100.0)
Monocytes Absolute: 1 K/uL (ref 0.1–1.0)
Monocytes Relative: 10 %
Neutro Abs: 8.1 K/uL — ABNORMAL HIGH (ref 1.7–7.7)
Neutrophils Relative %: 74 %
Platelets: 217 K/uL (ref 150–400)
RBC: 2.83 MIL/uL — ABNORMAL LOW (ref 3.87–5.11)
RDW: 14.6 % (ref 11.5–15.5)
WBC: 10.7 K/uL — ABNORMAL HIGH (ref 4.0–10.5)
nRBC: 0 % (ref 0.0–0.2)

## 2024-09-06 LAB — HEMOGLOBIN AND HEMATOCRIT, BLOOD
HCT: 26 % — ABNORMAL LOW (ref 36.0–46.0)
HCT: 23.9 % — ABNORMAL LOW (ref 36.0–46.0)
Hemoglobin: 8.1 g/dL — ABNORMAL LOW (ref 12.0–15.0)
Hemoglobin: 7.4 g/dL — ABNORMAL LOW (ref 12.0–15.0)

## 2024-09-06 MED ORDER — FUROSEMIDE 20 MG PO TABS
20.0000 mg | ORAL_TABLET | Freq: Every day | ORAL | Status: DC
  Administered 2024-09-06 – 2024-09-08 (×3): 20 mg via ORAL
  Filled 2024-09-06 (×3): qty 1

## 2024-09-06 NOTE — Plan of Care (Signed)
  Problem: Education: Goal: Knowledge of General Education information will improve Description: Including pain rating scale, medication(s)/side effects and non-pharmacologic comfort measures Outcome: Progressing   Problem: Clinical Measurements: Goal: Respiratory complications will improve Outcome: Progressing   Problem: Clinical Measurements: Goal: Cardiovascular complication will be avoided Outcome: Progressing   

## 2024-09-06 NOTE — Progress Notes (Signed)
 Physical Therapy Treatment Patient Details Name: [name] MRN: [id number] DOB: 06-27-1951 Today's Date: 09/06/2024   History of Present Illness 74 y.o. female presents to [facility] 09/04/24 with R upper quadrant pain, generalized weakness/fatigue, and SOB. CT scan showed ascites w/ sigmoid diverticulosis and R middle lobe pulmonary nodule. Also with acute hypoxic respiratory failure 2/2 acute on chronic CHF exacerbation, +UTI. PMHx: anxiety disorder, congestive heart failure, CKD, hypertension, class III obesity, hypothyroidism, paroxysmal atrial fibrillation and splenic infarct    PT Comments  Pt tolerated session well, progressing to OOB mobility this session. Pt requires maximal physical assistance of 2 for bed mobility, and completed multiple sit to stands with moderate physical assistance of 2. Pt then took lateral steps towards [place] with moderate physical assistance of 2 and physical facilitation of weight shift (see gait). Discussed therapists' discharge recommendation of skilled rehab with pt verbalizing willingness to consider options but would prefer to be D/C home. If pt D/C home, pt needs HH aid and 24/7 assist. Continuing to recommend inpatient follow up therapy, <3 hours/day.     If plan is discharge home, recommend the following: Two people to help with walking and/or transfers;Two people to help with bathing/dressing/bathroom;Assistance with cooking/housework;Assist for transportation;Help with stairs or ramp for entrance   Can travel by private vehicle     No  Equipment Recommendations  Hospital bed;Hoyer lift;Wheelchair cushion (measurements PT);Wheelchair (measurements PT);BSC/3in1 (bariatric)    Recommendations for Other Services       Precautions / Restrictions Precautions Precautions: Fall Recall of Precautions/Restrictions: Intact Restrictions Weight Bearing Restrictions Per Provider Order: No     Mobility  Bed Mobility Overal bed mobility: Needs  Assistance Bed Mobility: Supine to Sit, Sit to Supine     Supine to sit: Max assist, +2 for physical assistance, HOB elevated Sit to supine: Max assist, +2 for physical assistance   General bed mobility comments: For supine to sit on L side of bed, pt able to advance LLE but requries physical assistance for advancing RLE towards EOB. Pt then able to use R bed railing to assist with pull to long sit, and then requires physical assistance for trunk and hip management. For sit to supine, pt requires physical assistance for BLE and trunk management. VC given for sequencing; increased time to complete.    Transfers Overall transfer level: Needs assistance Equipment used: 2 person hand held assist Transfers: Sit to/from Stand Sit to Stand: Mod assist, +2 physical assistance           General transfer comment: Pt completed 2 STS from EOB with physical assistance for facilitating increased knee flexion, and then physical assistance of 2 for initial power-up. VC given to use BUE to push up from bed and to increase anterior trunk lean; pt requires facilitation for trunk lean.    Ambulation/Gait Ambulation/Gait assistance: Mod assist, +2 physical assistance Gait Distance (Feet): 4 Feet Assistive device: 2 person hand held assist Gait Pattern/deviations: Step-to pattern, Decreased step length - left, Decreased stance time - left, Decreased stride length, Knee flexed in stance - right, Knee flexed in stance - left, Antalgic Gait velocity: reduced Gait velocity interpretation: <1.31 ft/sec, indicative of household ambulator   General Gait Details: Pt took lateral steps towards HOB with moderate physical assistance of 2 to remain upright and then physical facilitation for weight shift to advance opposite limb. Step by step cues for sequencing. Pt reports she feels like she's falling but no signs of knee instability, and trunk is upright.  Stairs             Wheelchair Mobility     Tilt  Bed    Modified Rankin (Stroke Patients Only)       Balance Overall balance assessment: Needs assistance Sitting-balance support: No upper extremity supported, Feet supported Sitting balance-Leahy Scale: Good Sitting balance - Comments: seated EOB without UE support and no LOB   Standing balance support: Bilateral upper extremity supported, During functional activity, Reliant on assistive device for balance Standing balance-Leahy Scale: Poor Standing balance comment: reliant on external support for stability                            Communication Communication Communication: No apparent difficulties  Cognition Arousal: Alert Behavior During Therapy: Anxious   PT - Cognitive impairments: Safety/Judgement, Problem solving, Sequencing, Initiation                       PT - Cognition Comments: Pt reports severe anxiety pertaining to fear of falling and feels like she is falling despite +2 support and no signs of falling or instability. Cues for PLB helps with SOB. Following commands: Impaired Following commands impaired: Follows one step commands with increased time    Cueing Cueing Techniques: Verbal cues, Gestural cues, Tactile cues  Exercises      General Comments General comments (skin integrity, edema, etc.): VSS on 1L      Pertinent Vitals/Pain Pain Assessment Pain Assessment: Faces Faces Pain Scale: Hurts even more Pain Location: L thigh, thoracic spine Pain Descriptors / Indicators: Aching, Grimacing, Guarding, Discomfort Pain Intervention(s): Limited activity within patient's tolerance, Monitored during session, RN gave pain meds during session    Home Living                          Prior Function            PT Goals (current goals can now be found in the care plan section) Acute Rehab PT Goals Patient Stated Goal: to go home PT Goal Formulation: With patient Time For Goal Achievement: 09/19/24 Potential to Achieve  Goals: Fair Progress towards PT goals: Progressing toward goals    Frequency    Min 2X/week      PT Plan      Co-evaluation              AM-PAC PT 6 Clicks Mobility   Outcome Measure  Help needed turning from your back to your side while in a flat bed without using bedrails?: Total Help needed moving from lying on your back to sitting on the side of a flat bed without using bedrails?: Total Help needed moving to and from a bed to a chair (including a wheelchair)?: Total Help needed standing up from a chair using your arms (e.g., wheelchair or bedside chair)?: Total Help needed to walk in hospital room?: Total Help needed climbing 3-5 steps with a railing? : Total 6 Click Score: 6    End of Session Equipment Utilized During Treatment: Gait belt;Oxygen Activity Tolerance: Patient limited by pain Patient left: in bed;with call bell/phone within reach;with bed alarm set Nurse Communication: Mobility status PT Visit Diagnosis: Unsteadiness on feet (R26.81);Other abnormalities of gait and mobility (R26.89);Muscle weakness (generalized) (M62.81);History of falling (Z91.81)     Time: 8460-8377 PT Time Calculation (min) (ACUTE ONLY): 43 min  Charges:    $Therapeutic Activity: 38-52 mins PT General  Charges $$ ACUTE PT VISIT: 1 Visit                     Leontine Hilt DPT Acute Rehab Services [phone] Prefer contact via chat    Leontine NOVAK Alonzo Owczarzak 09/06/2024, 5:40 PM

## 2024-09-06 NOTE — Progress Notes (Signed)
 " Progress Note   Patient: [name] FMW:[id number] DOB: 1950-09-21 DOA: 09/04/2024     2 DOS: the patient was seen and examined on 09/06/2024   Brief hospital course: The patient is a 74 yr old woman who presented to [facility] ED on 09/04/2024 with complaints of right upper quadrant pain intermittently, weakness, fatigue. Initially it was thought that the patient's symptoms were due to UTI, but while still in the ED, prior to discharge the patient became hypoxic. The patient then revealed that she has had DOE, orthopnea, and lower extremity edema for the past few days.   In the ED the UA demonstrated proteinuria. CT of the abdomen and pelvis demonstrated ascites and a right middle lobe pulmonary nodule. BNP was elevated at 2538. Creatinine was 1.81. Hemoglobin upon presentation was 15.5. By 09/05/2024 the patient's hemoglobin had dropped to 10.8. CT of the patient's left thigh reveals a 500 cc collection of blood in the limb. The patient suffered injury to that extremity prior to presentation. She is on Eliquis  for her history of paroxysmal atrial fibrillation. Her last dose was on the evening of 09/04/2024. It is currently held.   Overnight her hemoglobin has dropped again to 8.2 this morning from 10.3 on 09/05/2024 and 15.5 on 09/04/2024. Rate of drop is slowing as Eliquis  is wearing off. Continue to monitor.  The patient's hemoglobin is being monitored carefully. She is agreeable to transfusion for hemoglobin of less than 7.0. She does not demonstrate any signs of shock.  Assessment and Plan: Principal Problem:   CHF (congestive heart failure) (HCC) Active Problems:   Essential hypertension   Hypothyroidism   Paroxysmal atrial fibrillation (HCC)   Morbid obesity with BMI of 50.0-59.9, adult (HCC)   Acute hypoxic respiratory failure secondary to acute on chronic CHF exacerbation.   Increasing shortness of breath, hypoxia secondary to CHF exacerbation..  Significant elevation of proBNP with trace  pleural effusion and lower extremity edema dyspnea on exertion suggestive of CHF.  Upon presentation the patient had pulse ox between 72-80 percent on room air while she was ambulating. Needed 2 L of oxygen.    On 40 mg p.o. twice daily as outpatient but has not responded well at home. She was given IV Lasix  40 milligram twice daily on admission.  Echocardiogram has demonstrated EF of 70 - 75%. Grade 1 diastolic dysfunction. No regional wall motion abnormalities. On 09/06/2024 the patient is saturating in the high 90's on room air. Will de-escalate diuresis given increasing creatinine.  Anemia/hematoma: Hemoglobin upon presentation was 15.5. By 09/05/2024 the patient's hemoglobin had dropped to 10.8. CT of the patient's left thigh reveals a 500 cc collection of blood in the limb. The patient suffered injury to that extremity prior to presentation. She is on Eliquis  for her history of paroxysmal atrial fibrillation. Her last dose was on the evening of 09/04/2024. It is currently held.   Right upper quadrant pain.  Abnormal urinalysis noted but CT scan without any acute findings.  Keflex  had been prescribed from the ED.  Urinalysis showed 6-10 white cells with negative nitrate.  No further pain or nausea vomiting at this time.  Follow urine cultures.  Continue Keflex  for now. CT abdomen and pelvis demonstrates the development of ascites, a stable 5 mm subpleural right middle lobe pulmonary nodule ( follow up necessary). Mild hepatic steatosis, a small hiatal hernia.    Generalized weakness fatigue, deconditioned and debilitated.  Will get PT OT evaluation.  Has a walker at home at  baseline.   Morbid obesity obesity   There is no height or weight on file to calculate BMI. Would benefit from lifestyle modification as outpatient.   History of paroxysmal atrial fibrillation Rate controlled at this time continue flecainide  metoprolol  home.  The patient's last dose of Eliquis  was on the evening of 09/04/2024. It has  been held due to the acute blood loss anemia and the left thigh hematoma. Patient does have history of loop recorder in the past.   Hypertension Hold losartan .  Continue metoprolol .  Continue to monitor blood pressure   Hypothyroidism.  Continue Synthroid .  TSH 2 months back was 2.2   AKI on CKD stage IIIa Creatinine upon presentation was 1.09. With diuresis and blood loss her creatinine has increased to 1.81. It appears that her baseline creatinine  is 1.60 in the last 12 months. Continue to monitor. Avoid nephrotoxic substances. Monitor electrolytes. . DVT Prophylaxis: Eliquis  is held. No chemoprophylaxis due to hematoma in left thigh.       Subjective: The patient is resting comfortably. No new complaints.   Physical Exam: Vitals:   09/06/24 0427 09/06/24 0713 09/06/24 1142 09/06/24 1535  BP: (!) 93/50 (!) 120/54 (!) 101/52 (!) 96/41  Pulse:  65 65 64  Resp:  20 20 20   Temp:  97.8 F (36.6 C) 98.1 F (36.7 C) (!) 97.3 F (36.3 C)  TempSrc:  Oral Oral Oral  SpO2:  100% 100% 100%  Weight:      Height:       Exam:  Constitutional:  The patient is awake, alert, and oriented x 3. No acute distress. Respiratory:  No increased work of breathing. No wheezes, rales, or rhonchi No tactile fremitus Cardiovascular:  Regular rate and rhythm No murmurs, ectopy, or gallups. No lateral PMI. No thrills. Abdomen:  Abdomen is soft, non-tender, non-distended No hernias, masses, or organomegaly Normoactive bowel sounds.  Musculoskeletal:  No cyanosis, clubbing, or edema The patient's lateral left thigh demonstrates a large hematoma that is very warm to touch.  Skin:  No rashes, lesions, ulcers palpation of skin: no induration or nodules Neurologic:  CN 2-12 intact Sensation all 4 extremities intact Psychiatric:  Mental status Mood, affect appropriate Orientation to person, place, time  judgment and insight appear intact  Data Reviewed:  CBC, BMP, CT left  thigh.  Family Communication: None available  Disposition: Status is: Inpatient Remains inpatient appropriate because: Worsening anemia, worsening creatinine, lower extremity edema.  Planned Discharge Destination: Home    Time spent: 38 minutes  Author: Kensie Susman, DO 09/06/2024 4:53 PM  For on call review [email or web address].  "

## 2024-09-07 DIAGNOSIS — N179 Acute kidney failure, unspecified: Secondary | ICD-10-CM

## 2024-09-07 DIAGNOSIS — D62 Acute posthemorrhagic anemia: Secondary | ICD-10-CM | POA: Diagnosis not present

## 2024-09-07 LAB — HEMOGLOBIN AND HEMATOCRIT, BLOOD
HCT: 26.3 % — ABNORMAL LOW (ref 36.0–46.0)
Hemoglobin: 8.5 g/dL — ABNORMAL LOW (ref 12.0–15.0)

## 2024-09-07 LAB — CBC WITH DIFFERENTIAL/PLATELET
Abs Immature Granulocytes: 0.06 K/uL (ref 0.00–0.07)
Basophils Absolute: 0.1 K/uL (ref 0.0–0.1)
Basophils Relative: 1 %
Eosinophils Absolute: 0.3 K/uL (ref 0.0–0.5)
Eosinophils Relative: 4 %
HCT: 24.4 % — ABNORMAL LOW (ref 36.0–46.0)
Hemoglobin: 7.9 g/dL — ABNORMAL LOW (ref 12.0–15.0)
Immature Granulocytes: 1 %
Lymphocytes Relative: 17 %
Lymphs Abs: 1.6 K/uL (ref 0.7–4.0)
MCH: 29.4 pg (ref 26.0–34.0)
MCHC: 32.4 g/dL (ref 30.0–36.0)
MCV: 90.7 fL (ref 80.0–100.0)
Monocytes Absolute: 1 K/uL (ref 0.1–1.0)
Monocytes Relative: 11 %
Neutro Abs: 6.2 K/uL (ref 1.7–7.7)
Neutrophils Relative %: 66 %
Platelets: 202 K/uL (ref 150–400)
RBC: 2.69 MIL/uL — ABNORMAL LOW (ref 3.87–5.11)
RDW: 14.7 % (ref 11.5–15.5)
WBC: 9.2 K/uL (ref 4.0–10.5)
nRBC: 0 % (ref 0.0–0.2)

## 2024-09-07 LAB — BASIC METABOLIC PANEL WITH GFR
Anion gap: 9 (ref 5–15)
BUN: 53 mg/dL — ABNORMAL HIGH (ref 8–23)
CO2: 30 mmol/L (ref 22–32)
Calcium: 7.9 mg/dL — ABNORMAL LOW (ref 8.9–10.3)
Chloride: 95 mmol/L — ABNORMAL LOW (ref 98–111)
Creatinine, Ser: 2.57 mg/dL — ABNORMAL HIGH (ref 0.44–1.00)
GFR, Estimated: 19 mL/min — ABNORMAL LOW
Glucose, Bld: 100 mg/dL — ABNORMAL HIGH (ref 70–99)
Potassium: 4.5 mmol/L (ref 3.5–5.1)
Sodium: 134 mmol/L — ABNORMAL LOW (ref 135–145)

## 2024-09-07 LAB — PREPARE RBC (CROSSMATCH)

## 2024-09-07 MED ORDER — ENSURE PLUS HIGH PROTEIN PO LIQD
237.0000 mL | Freq: Two times a day (BID) | ORAL | Status: DC
  Administered 2024-09-07 – 2024-09-13 (×9): 237 mL via ORAL
  Filled 2024-09-07: qty 237

## 2024-09-07 MED ORDER — SODIUM CHLORIDE 0.9% IV SOLUTION: Freq: Once | INTRAVENOUS | Status: AC

## 2024-09-07 MED ORDER — SODIUM CHLORIDE 0.9 % IV BOLUS
250.0000 mL | Freq: Once | INTRAVENOUS | Status: AC
  Administered 2024-09-07: 250 mL via INTRAVENOUS

## 2024-09-07 MED ORDER — LOPERAMIDE HCL 2 MG PO CAPS
2.0000 mg | ORAL_CAPSULE | Freq: Once | ORAL | Status: AC
  Administered 2024-09-07: 2 mg via ORAL
  Filled 2024-09-07: qty 1

## 2024-09-07 NOTE — Plan of Care (Signed)

## 2024-09-07 NOTE — Progress Notes (Signed)
 " Progress Note   Patient: [name] FMW:[id number] DOB: [birth year] DOA: 09/04/2024     3 DOS: the patient was seen and examined on 09/07/2024   Brief hospital course: The patient is a 74 yr old woman who presented to [facility] ED on 09/04/2024 with complaints of right upper quadrant pain intermittently, weakness, fatigue. Initially it was thought that the patient's symptoms were due to UTI, but while still in the ED, prior to discharge the patient became hypoxic. The patient then revealed that she has had DOE, orthopnea, and lower extremity edema for the past few days.   In the ED the UA demonstrated proteinuria. CT of the abdomen and pelvis demonstrated ascites and a right middle lobe pulmonary nodule. BNP was elevated at 2538. Creatinine was 1.81. Hemoglobin upon presentation was 15.5. By 09/05/2024 the patient's hemoglobin had dropped to 10.8. CT of the patient's left thigh reveals a 500 cc collection of blood in the limb. The patient suffered injury to that extremity prior to presentation. She is on Eliquis  for her history of paroxysmal atrial fibrillation. Her last dose was on the evening of 09/04/2024. It is currently held.   Overnight her hemoglobin has dropped again to 8.2 this morning from 10.3 on 09/05/2024 and 15.5 on 09/04/2024. Rate of drop is slowing as Eliquis  is wearing off. Continue to monitor.  The patient's hemoglobin dropped to 7.4 on the night of 09/06/2024. She also had a low blood pressure at the time. She has received 1 unit in transfusion. Drops in hemoglobin have begun to slow down off of Eliquis .  Assessment and Plan: Principal Problem:   CHF (congestive heart failure) (HCC) Active Problems:   Essential hypertension   Hypothyroidism   Paroxysmal atrial fibrillation (HCC)   Morbid obesity with BMI of 50.0-59.9, adult (HCC)   Acute hypoxic respiratory failure secondary to acute on chronic Diastolic CHF exacerbation.   Increasing shortness of breath, hypoxia secondary to CHF  exacerbation..  Significant elevation of proBNP with trace pleural effusion and lower extremity edema dyspnea on exertion suggestive of CHF.  Upon presentation the patient had pulse ox between 72-80 percent on room air while she was ambulating. Needed 2 L of oxygen.    On 40 mg p.o. twice daily as outpatient but has not responded well at home. She was given IV Lasix  40 milligram twice daily on admission.  Echocardiogram has demonstrated EF of 70 - 75%. Grade 1 diastolic dysfunction. No regional wall motion abnormalities. On 09/06/2024 the patient is saturating in the high 90's on room air. Will de-escalate diuresis given increasing creatinine.  Anemia/hematoma: Hemoglobin upon presentation was 15.5. By 09/05/2024 the patient's hemoglobin had dropped to 10.8. CT of the patient's left thigh reveals a 500 cc collection of blood in the limb. The patient suffered injury to that extremity prior to presentation. She is on Eliquis  for her history of paroxysmal atrial fibrillation. Her last dose was on the evening of 09/04/2024. It is currently held. The patient's hemoglobin dropped to 7.4 on the night of 09/06/2024. She also had a low blood pressure at the time. She has received 1 unit in transfusion. Drops in hemoglobin have begun to slow down off of Eliquis .  AKI on CKD IIIb:  It appears that the patient's baseline creatinine is between 1.0 and 1.4. She has had AKI due to volume loss due to ongoing bleeding. She is being transfused with one unit PRBC's last night. Monitor and avoid nephrotoxic substances. Monitor electrolytes and volume status.  Right  upper quadrant pain.  Abnormal urinalysis noted but CT scan without any acute findings.  Keflex  had been prescribed from the ED.  Urinalysis showed 6-10 white cells with negative nitrate.  No further pain or nausea vomiting at this time.  Follow urine cultures.  Continue Keflex  for now. CT abdomen and pelvis demonstrates the development of ascites, a stable 5 mm  subpleural right middle lobe pulmonary nodule ( follow up necessary). Mild hepatic steatosis, a small hiatal hernia.    Generalized weakness fatigue, deconditioned and debilitated.  Will get PT OT evaluation.  Has a walker at home at baseline.   Morbid obesity obesity   There is no height or weight on file to calculate BMI. Would benefit from lifestyle modification as outpatient.   History of paroxysmal atrial fibrillation Rate controlled at this time continue flecainide  metoprolol  home.  The patient's last dose of Eliquis  was on the evening of 09/04/2024. It has been held due to the acute blood loss anemia and the left thigh hematoma. Patient does have history of loop recorder in the past.   Hypertension Hold losartan .  Continue metoprolol .  Continue to monitor blood pressure   Hypothyroidism.  Continue Synthroid .  TSH 2 months back was 2.2   AKI on CKD stage IIIa Creatinine upon presentation was 1.09. With diuresis and blood loss her creatinine has increased to 2.57. It appears that her baseline creatinine  is 1.60 in the last 12 months. Continue to monitor. Avoid nephrotoxic substances. Monitor electrolytes. . DVT Prophylaxis: Eliquis  is held. No chemoprophylaxis due to hematoma in left thigh.       Subjective: The patient is resting comfortably. No new complaints.   Physical Exam: Vitals:   09/07/24 0805 09/07/24 0949 09/07/24 1253 09/07/24 1532  BP: (!) 121/57 (!) 124/53 (!) 113/47 (!) 139/53  Pulse: 66  69 70  Resp: 19 18 19 18   Temp: 97.7 F (36.5 C)  98.7 F (37.1 C) 98.5 F (36.9 C)  TempSrc: Oral  Oral Oral  SpO2: 94%  98% 97%  Weight:      Height:       Exam:  Constitutional:  The patient is awake, alert, and oriented x 3. No acute distress. Respiratory:  No increased work of breathing. No wheezes, rales, or rhonchi No tactile fremitus Cardiovascular:  Regular rate and rhythm No murmurs, ectopy, or gallups. No lateral PMI. No thrills. Abdomen:  Abdomen  is soft, non-tender, non-distended No hernias, masses, or organomegaly Normoactive bowel sounds.  Musculoskeletal:  No cyanosis, clubbing, or edema The patient's lateral left thigh demonstrates a large hematoma that is very warm to touch.  Skin:  No rashes, lesions, ulcers palpation of skin: no induration or nodules Neurologic:  CN 2-12 intact Sensation all 4 extremities intact Psychiatric:  Mental status Mood, affect appropriate Orientation to person, place, time  judgment and insight appear intact  Data Reviewed:  CBC, BMP, CT left thigh.  Family Communication: None available  Disposition: Status is: Inpatient Remains inpatient appropriate because: Worsening anemia, worsening creatinine, lower extremity edema.  Planned Discharge Destination: Home    Time spent: 40 minutes  Author: Tawania Daponte, DO 09/07/2024 6:13 PM  For on call review [email or web address].  "

## 2024-09-07 NOTE — Progress Notes (Signed)
 Patient is hypotensive. Hgb has been decreasing from 15.5 on 1/14 to 7.4 earlier this shift. Plan to transfuse 1 unit RBC.

## 2024-09-08 DIAGNOSIS — I5033 Acute on chronic diastolic (congestive) heart failure: Secondary | ICD-10-CM | POA: Diagnosis not present

## 2024-09-08 LAB — CBC WITH DIFFERENTIAL/PLATELET
Abs Immature Granulocytes: 0.05 K/uL (ref 0.00–0.07)
Basophils Absolute: 0 K/uL (ref 0.0–0.1)
Basophils Relative: 0 %
Eosinophils Absolute: 0.4 K/uL (ref 0.0–0.5)
Eosinophils Relative: 4 %
HCT: 26.2 % — ABNORMAL LOW (ref 36.0–46.0)
Hemoglobin: 8.4 g/dL — ABNORMAL LOW (ref 12.0–15.0)
Immature Granulocytes: 1 %
Lymphocytes Relative: 18 %
Lymphs Abs: 1.4 K/uL (ref 0.7–4.0)
MCH: 29 pg (ref 26.0–34.0)
MCHC: 32.1 g/dL (ref 30.0–36.0)
MCV: 90.3 fL (ref 80.0–100.0)
Monocytes Absolute: 1 K/uL (ref 0.1–1.0)
Monocytes Relative: 12 %
Neutro Abs: 5.3 K/uL (ref 1.7–7.7)
Neutrophils Relative %: 65 %
Platelets: 206 K/uL (ref 150–400)
RBC: 2.9 MIL/uL — ABNORMAL LOW (ref 3.87–5.11)
RDW: 15.2 % (ref 11.5–15.5)
WBC: 8.1 K/uL (ref 4.0–10.5)
nRBC: 0 % (ref 0.0–0.2)

## 2024-09-08 LAB — BASIC METABOLIC PANEL WITH GFR
Anion gap: 6 (ref 5–15)
BUN: 48 mg/dL — ABNORMAL HIGH (ref 8–23)
CO2: 34 mmol/L — ABNORMAL HIGH (ref 22–32)
Calcium: 8.3 mg/dL — ABNORMAL LOW (ref 8.9–10.3)
Chloride: 100 mmol/L (ref 98–111)
Creatinine, Ser: 1.41 mg/dL — ABNORMAL HIGH (ref 0.44–1.00)
GFR, Estimated: 39 mL/min — ABNORMAL LOW
Glucose, Bld: 110 mg/dL — ABNORMAL HIGH (ref 70–99)
Potassium: 4.9 mmol/L (ref 3.5–5.1)
Sodium: 140 mmol/L (ref 135–145)

## 2024-09-08 NOTE — Plan of Care (Signed)

## 2024-09-08 NOTE — Progress Notes (Signed)
 " Progress Note   Patient: [name] FMW:[id number] DOB: 1950/12/16 DOA: 09/04/2024     4 DOS: the patient was seen and examined on 09/08/2024   Brief hospital course: The patient is a 74 yr old woman who presented to [facility] ED on 09/04/2024 with complaints of right upper quadrant pain intermittently, weakness, fatigue. Initially it was thought that the patient's symptoms were due to UTI, but while still in the ED, prior to discharge the patient became hypoxic. The patient then revealed that she has had DOE, orthopnea, and lower extremity edema for the past few days.   In the ED the UA demonstrated proteinuria. CT of the abdomen and pelvis demonstrated ascites and a right middle lobe pulmonary nodule. BNP was elevated at 2538. Creatinine was 1.81. Hemoglobin upon presentation was 15.5. By 09/05/2024 the patient's hemoglobin had dropped to 10.8. CT of the patient's left thigh reveals a 500 cc collection of blood in the limb. The patient suffered injury to that extremity prior to presentation. She is on Eliquis  for her history of paroxysmal atrial fibrillation. Her last dose was on the evening of 09/04/2024. It is currently held.   Overnight her hemoglobin has dropped again to 8.2 this morning from 10.3 on 09/05/2024 and 15.5 on 09/04/2024. Rate of drop is slowing as Eliquis  is wearing off. Continue to monitor.  The patient's hemoglobin dropped to 7.4 on the night of 09/06/2024. She also had a low blood pressure at the time. She has received 1 unit in transfusion. Drops in hemoglobin have begun to slow down off of Eliquis .  On 09/08/2024 the patient's hemoglobin had stabilized. From 8.5 on 09/07/2024 to 8.4 on 09/08/2024. Her creatinine has declined from 2.57 to 1.41 which is close to her baseline. She states that she is feeling better.  Assessment and Plan: Principal Problem:   CHF (congestive heart failure) (HCC) Active Problems:   Essential hypertension   Hypothyroidism   Paroxysmal atrial  fibrillation (HCC)   Morbid obesity with BMI of 50.0-59.9, adult (HCC)   Acute hypoxic respiratory failure secondary to acute on chronic Diastolic CHF exacerbation.   Increasing shortness of breath, hypoxia secondary to CHF exacerbation..  Significant elevation of proBNP with trace pleural effusion and lower extremity edema dyspnea on exertion suggestive of CHF.  Upon presentation the patient had pulse ox between 72-80 percent on room air while she was ambulating. Needed 2 L of oxygen.    On 40 mg p.o. twice daily as outpatient but has not responded well at home. She was given IV Lasix  40 milligram twice daily on admission.  Echocardiogram has demonstrated EF of 70 - 75%. Grade 1 diastolic dysfunction. No regional wall motion abnormalities. On 09/06/2024 the patient is saturating in the high 90's on room air. Will de-escalate diuresis given increasing creatinine.  Anemia/hematoma: Hemoglobin upon presentation was 15.5. By 09/05/2024 the patient's hemoglobin had dropped to 10.8. CT of the patient's left thigh reveals a 500 cc collection of blood in the limb. The patient suffered injury to that extremity prior to presentation. She is on Eliquis  for her history of paroxysmal atrial fibrillation. Her last dose was on the evening of 09/04/2024. It is currently held. The patient's hemoglobin dropped to 7.4 on the night of 09/06/2024. She also had a low blood pressure at the time. She has received 1 unit in transfusion. Drops in hemoglobin have begun to slow down off of Eliquis . On 09/08/2024 the patient's hemoglobin had stabilized. From 8.5 on 09/07/2024 to 8.4 on 09/08/2024.  Her creatinine has declined from 2.57 to 1.41 which is close to her baseline. Plan to continue to monitor off of anticoagulation until tomorrow am when Eliquis  will be restarted. Then monitor for further bleeding for a day prior to discharge to home.  AKI on CKD IIIb:  It appears that the patient's baseline creatinine is between 1.0 and 1.4. She  has had AKI due to volume loss due to ongoing bleeding. She is being transfused with one unit PRBC's last night. Monitor and avoid nephrotoxic substances. Monitor electrolytes and volume status.  Her creatinine has declined from 2.57 to 1.41 pn 09/08/2024. Baseline creatinine appears to by 1.6 over the past 21 months.   Right upper quadrant pain.  Abnormal urinalysis noted but CT scan without any acute findings.  Keflex  had been prescribed from the ED.  Urinalysis showed 6-10 white cells with negative nitrate.  No further pain or nausea vomiting at this time.  Follow urine cultures.  Continue Keflex  for now. CT abdomen and pelvis demonstrates the development of ascites, a stable 5 mm subpleural right middle lobe pulmonary nodule ( follow up necessary). Mild hepatic steatosis, a small hiatal hernia.    Generalized weakness fatigue, deconditioned and debilitated.  Will get PT OT evaluation.  Has a walker at home at baseline.   Morbid obesity obesity   There is no height or weight on file to calculate BMI. Would benefit from lifestyle modification as outpatient.   History of paroxysmal atrial fibrillation Rate controlled at this time continue flecainide  metoprolol  home.  The patient's last dose of Eliquis  was on the evening of 09/04/2024. It has been held due to the acute blood loss anemia and the left thigh hematoma. Patient does have history of loop recorder in the past.   Hypertension Hold losartan .  Continue metoprolol .  Continue to monitor blood pressure   Hypothyroidism.  Continue Synthroid .  TSH 2 months back was 2.2 . DVT Prophylaxis: Eliquis  is held. No chemoprophylaxis due to hematoma in left thigh.       Subjective: The patient is resting comfortably. No new complaints.   Physical Exam: Vitals:   09/07/24 2250 09/08/24 0357 09/08/24 0747 09/08/24 1108  BP: (!) 137/59 (!) 118/50 (!) 145/68 (!) 114/59  Pulse:   65 65  Resp:   18 20  Temp: 98.4 F (36.9 C) 98.1 F (36.7 C) 97.8 F  (36.6 C) 98.3 F (36.8 C)  TempSrc: Oral Oral Oral Oral  SpO2:   98% 97%  Weight:  (!) 175.6 kg    Height:       Exam:  Constitutional:  The patient is awake, alert, and oriented x 3. No acute distress. Respiratory:  No increased work of breathing. No wheezes, rales, or rhonchi No tactile fremitus Cardiovascular:  Regular rate and rhythm No murmurs, ectopy, or gallups. No lateral PMI. No thrills. Abdomen:  Abdomen is soft, non-tender, non-distended No hernias, masses, or organomegaly Normoactive bowel sounds.  Musculoskeletal:  No cyanosis, clubbing, or edema The patient's lateral left thigh demonstrates a large hematoma that is very warm to touch.  Skin:  No rashes, lesions, ulcers palpation of skin: no induration or nodules Neurologic:  CN 2-12 intact Sensation all 4 extremities intact Psychiatric:  Mental status Mood, affect appropriate Orientation to person, place, time  judgment and insight appear intact  Data Reviewed:  CBC, BMP, CT left thigh.  Family Communication: None available  Disposition: Status is: Inpatient Remains inpatient appropriate because: Worsening anemia, worsening creatinine, lower extremity edema.  Planned Discharge Destination: Home    Time spent: 42 minutes  Author: Amery Minasyan, DO 09/08/2024 3:21 PM  For on call review [email or web address].  "

## 2024-09-08 NOTE — NC FL2 (Cosign Needed)
 " [place]  MEDICAID FL2 LEVEL OF CARE FORM     IDENTIFICATION  Patient Name: [name] Birthdate: Oct 10, 1950 Sex: female Admission Date (Current Location): 09/04/2024  [facility] [place] and [place] Number:  Producer, Television/film/video and Address:  [facility], 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) 01/02/2022   Hyponatremia 01/02/2022   Fall    Left knee pain 10/19/2021   History of loop recorder - MDT 09/21/2021   Anxiety disorder 09/08/2021   Chronic diastolic heart failure (HCC) 09/08/2021   Recurrent major depression 09/08/2021   Vitamin D  deficiency 09/08/2021   Former Tobacco user 09/08/2021   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   Paroxysmal atrial fibrillation (HCC) 09/08/2021   Gastroesophageal reflux disease 09/08/2021   Morbid obesity with BMI of 50.0-59.9, adult (HCC) 09/08/2021   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   OA (osteoarthritis) of knee 04/04/2016   Essential hypertension 10/15/2015   Hypothyroidism 10/15/2015   Incarcerated umbilical hernia 10/12/2015    Orientation RESPIRATION BLADDER Height & Weight     Self, Time, Situation, Place  O2 (as needed) Incontinent Weight: (!) 387 lb 3.2 oz (175.6 kg) Height:  5' 7 (170.2 cm)  BEHAVIORAL SYMPTOMS/MOOD NEUROLOGICAL BOWEL NUTRITION STATUS      Continent Diet (see dc summary)  AMBULATORY STATUS COMMUNICATION OF NEEDS Skin   Extensive  Assist Verbally Normal                       Personal Care Assistance Level of Assistance  Bathing, Feeding, Dressing Bathing Assistance: Limited assistance Feeding assistance: Limited assistance Dressing Assistance: Limited assistance     Functional Limitations Info  Sight, Hearing, Speech Sight Info: Impaired Hearing Info: Adequate Speech Info: Adequate    SPECIAL CARE FACTORS FREQUENCY  PT (By licensed PT), OT (By licensed OT)                    Contractures Contractures Info: Not present    Additional Factors Info  Code Status, Allergies Code Status Info: FULL CODE Allergies Info: Augmentin (amoxicillin-pot Clavulanate); Pneumococcal Vaccine           Current Medications (09/08/2024):  This is the current hospital active medication list Current Facility-Administered Medications  Medication Dose Route Frequency Provider Last Rate Last Admin   acetaminophen  (TYLENOL ) tablet 500-1,000 mg  500-1,000 mg Oral Daily PRN Pokhrel, Laxman, MD   1,000 mg at 09/06/24 1545   escitalopram  (LEXAPRO ) tablet 5 mg  5 mg Oral Daily Pokhrel, Laxman, MD   5 mg at 09/08/24 1021   feeding supplement (ENSURE PLUS HIGH PROTEIN) liquid 237 mL  237 mL Oral BID BM Swayze, Ava, DO   237 mL at 09/07/24 1459   flecainide  (TAMBOCOR ) tablet 100 mg  100 mg Oral BID Pokhrel, Laxman, MD   100 mg at 09/08/24 1021   furosemide  (LASIX ) tablet 20 mg  20 mg Oral Daily Swayze, Ava, DO   20 mg at 09/08/24 1021   hydrALAZINE  (APRESOLINE ) injection 5 mg  5 mg Intravenous Q6H PRN Pokhrel, Laxman, MD       levothyroxine  (SYNTHROID ) tablet 88 mcg  88 mcg Oral q morning Pokhrel, Laxman, MD   88 mcg at 09/08/24 9385   metoprolol  tartrate (LOPRESSOR ) tablet 50 mg  50 mg Oral BID Pokhrel, Laxman, MD   50 mg at 09/08/24 1022   ondansetron  (ZOFRAN ) tablet 4 mg  4 mg Oral Q6H PRN Pokhrel, Laxman, MD       Or   ondansetron  (ZOFRAN ) injection 4 mg  4 mg Intravenous Q6H PRN Pokhrel, Laxman, MD       pantoprazole   (PROTONIX ) EC tablet 40 mg  40 mg Oral Daily Pokhrel, Laxman, MD   40 mg at 09/08/24 1021   polyethylene glycol (MIRALAX  / GLYCOLAX ) packet 17 g  17 g Oral Daily PRN Pokhrel, Laxman, MD   17 g at 09/05/24 2148   sodium chloride  flush (NS) 0.9 % injection 3 mL  3 mL Intravenous Q12H Pokhrel, Laxman, MD   3 mL at 09/07/24 0946   sodium chloride  flush (NS) 0.9 % injection 3 mL  3 mL Intravenous PRN Pokhrel, Laxman, MD         Discharge Medications: Please see discharge summary for a list of discharge medications.  Relevant Imaging Results:  Relevant Lab Results:   Additional Information SSN [id number]  [name] [place], [place]     "

## 2024-09-09 LAB — COMPREHENSIVE METABOLIC PANEL WITH GFR
ALT: 10 U/L (ref 0–44)
AST: 21 U/L (ref 15–41)
Albumin: 3 g/dL — ABNORMAL LOW (ref 3.5–5.0)
Alkaline Phosphatase: 63 U/L (ref 38–126)
Anion gap: 7 (ref 5–15)
BUN: 37 mg/dL — ABNORMAL HIGH (ref 8–23)
CO2: 34 mmol/L — ABNORMAL HIGH (ref 22–32)
Calcium: 8.6 mg/dL — ABNORMAL LOW (ref 8.9–10.3)
Chloride: 96 mmol/L — ABNORMAL LOW (ref 98–111)
Creatinine, Ser: 1.03 mg/dL — ABNORMAL HIGH (ref 0.44–1.00)
GFR, Estimated: 56 mL/min — ABNORMAL LOW
Glucose, Bld: 104 mg/dL — ABNORMAL HIGH (ref 70–99)
Potassium: 5.1 mmol/L (ref 3.5–5.1)
Sodium: 136 mmol/L (ref 135–145)
Total Bilirubin: 0.7 mg/dL (ref 0.0–1.2)
Total Protein: 6.1 g/dL — ABNORMAL LOW (ref 6.5–8.1)

## 2024-09-09 LAB — CBC WITH DIFFERENTIAL/PLATELET
Abs Immature Granulocytes: 0.06 K/uL (ref 0.00–0.07)
Basophils Absolute: 0.1 K/uL (ref 0.0–0.1)
Basophils Relative: 1 %
Eosinophils Absolute: 0.4 K/uL (ref 0.0–0.5)
Eosinophils Relative: 4 %
HCT: 26.4 % — ABNORMAL LOW (ref 36.0–46.0)
Hemoglobin: 8.3 g/dL — ABNORMAL LOW (ref 12.0–15.0)
Immature Granulocytes: 1 %
Lymphocytes Relative: 14 %
Lymphs Abs: 1.5 K/uL (ref 0.7–4.0)
MCH: 28.9 pg (ref 26.0–34.0)
MCHC: 31.4 g/dL (ref 30.0–36.0)
MCV: 92 fL (ref 80.0–100.0)
Monocytes Absolute: 1.1 K/uL — ABNORMAL HIGH (ref 0.1–1.0)
Monocytes Relative: 11 %
Neutro Abs: 7.3 K/uL (ref 1.7–7.7)
Neutrophils Relative %: 69 %
Platelets: 239 K/uL (ref 150–400)
RBC: 2.87 MIL/uL — ABNORMAL LOW (ref 3.87–5.11)
RDW: 14.9 % (ref 11.5–15.5)
WBC: 10.3 K/uL (ref 4.0–10.5)
nRBC: 0 % (ref 0.0–0.2)

## 2024-09-09 LAB — MRSA NEXT GEN BY PCR, NASAL: MRSA by PCR Next Gen: NOT DETECTED

## 2024-09-09 MED ORDER — APIXABAN 5 MG PO TABS
5.0000 mg | ORAL_TABLET | Freq: Two times a day (BID) | ORAL | Status: DC
  Administered 2024-09-09 – 2024-09-13 (×9): 5 mg via ORAL
  Filled 2024-09-09 (×9): qty 1

## 2024-09-09 MED ORDER — FUROSEMIDE 40 MG PO TABS
40.0000 mg | ORAL_TABLET | Freq: Two times a day (BID) | ORAL | Status: DC
  Administered 2024-09-09 – 2024-09-13 (×8): 40 mg via ORAL
  Filled 2024-09-09 (×8): qty 1

## 2024-09-09 NOTE — Plan of Care (Signed)

## 2024-09-09 NOTE — TOC Initial Note (Signed)
 Transition of Care [facility]) - Initial/Assessment Note    Patient Details  Name: [name] MRN: [id number] Date of Birth: Feb 14, 1951  [facility] CM/SW Contact:    Luise JAYSON Pan, LCSWA Phone Number: 09/09/2024, 12:38 PM  Clinical Narrative:  CSW discussed PT rec for SNF with patient. Patient declined SNF and informed CSW that she wants to go home. Patient reported that one of her sons is taking time off work to stay with her and that she can ask her other son for additional support if needed. CSW inquired if patient would be more open with home health services, patient agreed. Patient reported that she had [facility] previously but [facility]. CSW notified RNCM.   Expected Discharge Plan: Home w Home Health Services Barriers to Discharge: Continued Medical Work up   Patient Goals and CMS Choice Patient states their goals for this hospitalization and ongoing recovery are:: To return home CMS Medicare.gov Compare Post Acute Care list provided to::  (NA) Choice offered to / list presented to : NA [place] ownership interest in [facility].provided to::  (NA)    Expected Discharge Plan and Services In-house Referral: Clinical Social Work Discharge Planning Services: CM Consult Post Acute Care Choice: Home Health Living arrangements for the past 2 months: Single Family Home                                      Prior Living Arrangements/Services Living arrangements for the past 2 months: Single Family Home Lives with:: Self Patient language and need for interpreter reviewed:: Yes Do you feel safe going back to the place where you live?: Yes      Need for Family Participation in Patient Care: No (Comment) Care giver support system in place?: Yes (comment)   Criminal Activity/Legal Involvement Pertinent to Current Situation/Hospitalization: No - Comment as needed  Activities of Daily Living   ADL Screening  (condition at time of admission) Independently performs ADLs?: Yes (appropriate for developmental age) Is the patient deaf or have difficulty hearing?: No Does the patient have difficulty seeing, even when wearing glasses/contacts?: No Does the patient have difficulty concentrating, remembering, or making decisions?: No  Permission Sought/Granted Permission sought to share information with : Facility Medical Sales Representative, Case Estate Manager/land Agent granted to share information with : Yes, Verbal Permission Granted     Permission granted to share info w AGENCY: HH        Emotional Assessment Appearance:: Appears stated age Attitude/Demeanor/Rapport: Engaged Affect (typically observed): Pleasant Orientation: : Oriented to Self, Oriented to Place, Oriented to  Time, Oriented to Situation Alcohol / Substance Use: Not Applicable Psych Involvement: No (comment)  Admission diagnosis:  Lower urinary tract infectious disease [N39.0] CHF (congestive heart failure) (HCC) [I50.9] Acute respiratory failure with hypoxia (HCC) [J96.01] Patient Active Problem List   Diagnosis Date Noted   CHF (congestive heart failure) (HCC) 09/04/2024   Left leg cellulitis 01/02/2022   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) 01/02/2022   Hyponatremia 01/02/2022   Fall    Left knee pain 10/19/2021   History of loop recorder - MDT 09/21/2021   Anxiety disorder 09/08/2021   Chronic diastolic heart failure (HCC) 09/08/2021   Recurrent major depression 09/08/2021   Vitamin D  deficiency 09/08/2021   Former Tobacco user 09/08/2021   Infarction of spleen  09/08/2021   Paroxysmal atrial fibrillation (HCC) 09/08/2021   Gastroesophageal reflux disease 09/08/2021   Morbid obesity with BMI of 50.0-59.9, adult (HCC) 09/08/2021   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   OA (osteoarthritis) of knee 04/04/2016   Essential hypertension 10/15/2015   Hypothyroidism 10/15/2015    Incarcerated umbilical hernia 10/12/2015   PCP:  Clarice Nottingham, MD Pharmacy:   [facility] - [place], [place] - [address] [place] [address] Phone: [phone] Fax: [phone]     Social Drivers of Health (SDOH) Social History: SDOH Screenings   Food Insecurity: No Food Insecurity (09/05/2024)  Housing: Low Risk (09/05/2024)  Transportation Needs: No Transportation Needs (09/05/2024)  Utilities: Not At Risk (09/05/2024)  Social Connections: Socially Isolated (09/05/2024)  Tobacco Use: Medium Risk (09/04/2024)  Health Literacy: Adequate Health Literacy (05/31/2023)   SDOH Interventions: Social Connections Interventions: Inpatient TOC   Readmission Risk Interventions     No data to display

## 2024-09-09 NOTE — TOC Progression Note (Addendum)
 Transition of Care [facility]) - Progression Note    Patient Details  Name: [name] MRN: [id number] Date of Birth: 12-Jul-1951  [facility]) CM/SW Contact  Waddell Barnie Rama, RN Phone Number: 09/09/2024, 12:48 PM  Clinical Narrative:    NCM notified by CSW that patient does not want to go to SNF she would like HH, but had [facility] in the past and want to look at other agencies.  NCM sent referral out thru hub. Await offers.  Patient chose [facility] from choices.  Soc will begin 24 to 48 hrs post dc.   Expected Discharge Plan: Home w Home Health Services Barriers to Discharge: Continued Medical Work up               Expected Discharge Plan and Services In-house Referral: Clinical Social Work Discharge Planning Services: CM Consult Post Acute Care Choice: Home Health Living arrangements for the past 2 months: Single Family Home                                       Social Drivers of Health (SDOH) Interventions SDOH Screenings   Food Insecurity: No Food Insecurity (09/05/2024)  Housing: Low Risk (09/05/2024)  Transportation Needs: No Transportation Needs (09/05/2024)  Utilities: Not At Risk (09/05/2024)  Social Connections: Socially Isolated (09/05/2024)  Tobacco Use: Medium Risk (09/04/2024)  Health Literacy: Adequate Health Literacy (05/31/2023)    Readmission Risk Interventions     No data to display

## 2024-09-09 NOTE — Progress Notes (Signed)
 Occupational Therapy Treatment Patient Details Name: [name] MRN: [id number] DOB: 1951-06-11 Today's Date: 09/09/2024   History of present illness 74 y.o. female presents to [facility] 09/04/24 with R upper quadrant pain, generalized weakness/fatigue, and SOB. CT scan showed ascites w/ sigmoid diverticulosis and R middle lobe pulmonary nodule. Also with acute hypoxic respiratory failure 2/2 acute on chronic CHF exacerbation, +UTI. PMHx: anxiety disorder, congestive heart failure, CKD, hypertension, class III obesity, hypothyroidism, paroxysmal atrial fibrillation and splenic infarct   OT comments  Patient received in supine and eager to participate. Patient's son brought patient's rollator to use for mobility. Patient required max assist +2 and increased time to get to EOB. Patient was able to stand from EOB with mod assist +2 to rollator. Patient asking to use BSC once up and was able to transfer to [facility] with mod assist +2. Patient required max assist +2 to stand from [facility] and total assist for toilet hygiene while standing. Patient returned to EOB and stated she would like to return to supine due to fatigue and required max assist +2.  Patient will benefit from continued inpatient follow up therapy, <3 hours/day but patient is refusing SNF and OT recommends HHOT to follow if patient discharges home.  Acute OT to continue to follow to address established goals to facilitate DC to next venue of care.        If plan is discharge home, recommend the following:  Two people to help with walking and/or transfers;A lot of help with bathing/dressing/bathroom;Assistance with cooking/housework;Assist for transportation;Help with stairs or ramp for entrance   Equipment Recommendations  None recommended by OT    Recommendations for Other Services      Precautions / Restrictions Precautions Precautions: Fall Recall of Precautions/Restrictions: Intact Restrictions Weight Bearing Restrictions Per  Provider Order: No       Mobility Bed Mobility Overal bed mobility: Needs Assistance Bed Mobility: Supine to Sit, Sit to Supine     Supine to sit: Max assist, +2 for physical assistance, HOB elevated Sit to supine: Max assist, +2 for physical assistance   General bed mobility comments: increased time and assistance with BLE and trunk for bed mobility with patient stating BLE pain during bed mobility    Transfers Overall transfer level: Needs assistance Equipment used: Rollator (4 wheels) Transfers: Sit to/from Stand, Bed to chair/wheelchair/BSC Sit to Stand: Mod assist, +2 physical assistance     Step pivot transfers: Mod assist, +2 physical assistance     General transfer comment: patient stood from EOB with mod assist +2 to rollator. Patient performed step pivot transfer to bariatric BSC and max assist +2 to stand from [facility].     Balance Overall balance assessment: Needs assistance Sitting-balance support: No upper extremity supported, Feet supported Sitting balance-Leahy Scale: Good Sitting balance - Comments: seated EOB without UE support and no LOB   Standing balance support: Bilateral upper extremity supported, During functional activity, Reliant on assistive device for balance Standing balance-Leahy Scale: Poor Standing balance comment: reliant on external support for stability                           ADL either performed or assessed with clinical judgement   ADL Overall ADL's : Needs assistance/impaired     Grooming: Wash/dry hands;Wash/dry face;Set up;Supervision/safety;Sitting Grooming Details (indicate cue type and reason): on EOB                 Toilet Transfer: Moderate  assistance;Maximal assistance;+2 for physical assistance;BSC/3in1;Rollator (4 wheels) Toilet Transfer Details (indicate cue type and reason): mod assist +2 to stand from EOB and max assist +2 to stand from bariatric [facility] Toileting- Clothing Manipulation and Hygiene: Total  assistance;Sit to/from stand Toileting - Clothing Manipulation Details (indicate cue type and reason): patient recliant on BUE Support when standing and requied total asssit for toilet hygiene and support of another while standing            Extremity/Trunk Assessment              Vision       Perception     Praxis     Communication Communication Communication: No apparent difficulties   Cognition Arousal: Alert Behavior During Therapy: Anxious Cognition: No apparent impairments                               Following commands: Impaired Following commands impaired: Follows one step commands with increased time      Cueing   Cueing Techniques: Verbal cues, Gestural cues, Tactile cues  Exercises      Shoulder Instructions       General Comments VSS on 2 liters O2    Pertinent Vitals/ Pain       Pain Assessment Pain Assessment: Faces Faces Pain Scale: Hurts even more Pain Location: BLE during bed mobility Pain Descriptors / Indicators: Aching, Grimacing, Guarding, Discomfort Pain Intervention(s): Limited activity within patient's tolerance, Monitored during session, Repositioned  Home Living                                          Prior Functioning/Environment              Frequency  Min 2X/week        Progress Toward Goals  OT Goals(current goals can now be found in the care plan section)  Progress towards OT goals: Progressing toward goals  Acute Rehab OT Goals OT Goal Formulation: With patient Time For Goal Achievement: 09/19/24 Potential to Achieve Goals: Fair ADL Goals Pt Will Perform Grooming: with supervision;standing Pt Will Perform Lower Body Bathing: with supervision;with adaptive equipment;sit to/from stand Pt Will Perform Lower Body Dressing: with supervision;with adaptive equipment;sit to/from stand Pt Will Transfer to Toilet: with supervision;ambulating;bedside commode Pt Will Perform  Toileting - Clothing Manipulation and hygiene: with supervision;sit to/from stand Pt/caregiver will Perform Home Exercise Program: Increased strength;Both right and left upper extremity;Independently;With written HEP provided  Plan      Co-evaluation                 AM-PAC OT 6 Clicks Daily Activity     Outcome Measure   Help from another person eating meals?: None Help from another person taking care of personal grooming?: A Little Help from another person toileting, which includes using toliet, bedpan, or urinal?: Total Help from another person bathing (including washing, rinsing, drying)?: A Lot Help from another person to put on and taking off regular upper body clothing?: A Little Help from another person to put on and taking off regular lower body clothing?: Total 6 Click Score: 14    End of Session Equipment Utilized During Treatment: Gait belt;Rollator (4 wheels);Oxygen (2 liters)  OT Visit Diagnosis: Muscle weakness (generalized) (M62.81);Pain Pain - Right/Left: Right (and left) Pain - part of body: Leg   Activity  Tolerance Patient tolerated treatment well   Patient Left in bed;with call bell/phone within reach   Nurse Communication Mobility status        Time: 8684-8652 OT Time Calculation (min): 32 min  Charges: OT General Charges $OT Visit: 1 Visit OT Treatments $Self Care/Home Management : 23-37 mins  Dick Laine, OTA Acute Rehabilitation Services  Office [phone]   Jeb LITTIE Laine 09/09/2024, 2:33 PM

## 2024-09-09 NOTE — Progress Notes (Signed)
 " PROGRESS NOTE    [name]  FMW:[id number] DOB: 08/19/1951 DOA: 09/04/2024 PCP: Clarice Nottingham, MD  Subjective: Patient reports feeling okay, wondering about her hemoglobin levels.  She denied any shortness of breath.  Hospital Course: 74 year old female with a PMH of anxiety disorder, CHF, CKD, HTN, obesity, hypothyroidism, paroxysmal A-fib (on Eliquis ), who presented to the ED for right upper quadrant abdominal pain.  Initially thought to be due to UTI, but then patient became hypoxic, and had dyspnea on exertion and lower extremity edema.  She also reported to have injury to the left thigh prior to admission.  Initial imaging included CT of abdomen/pelvis that showed ascites and right middle lobe pulmonary nodule, and she also had elevated BNP.  She was diuresed for acute on chronic HFpEF.  Hospital course complicated by anemia, with hemoglobin dropping down to 7.4 for which she received 1 unit PRBC and her Eliquis  was held.  CT of the left thigh showed 500 mL collection of blood in her limb.  Hemoglobin has now stabilized   Assessment and Plan:  Acute hypoxic respiratory failure secondary to acute on chronic Diastolic CHF exacerbation.   Increasing shortness of breath, hypoxia secondary to CHF exacerbation..  Significant elevation of proBNP with trace pleural effusion and lower extremity edema dyspnea on exertion suggestive of CHF.  Upon presentation the patient had pulse ox between 72-80 percent on room air while she was ambulating. Needed 2 L of oxygen.    On 40 mg p.o. twice daily as outpatient but has not responded well at home. She was given IV Lasix  40 milligram twice daily on admission.  Echocardiogram has demonstrated EF of 70 - 75%. Grade 1 diastolic dysfunction. No regional wall motion abnormalities. - she was diuresed with IV Lasix , now transition back to her home p.o. Lasix    Anemia/hematoma: Hemoglobin upon presentation was 15.5. By 09/05/2024 the patient's hemoglobin had  dropped to 10.8. CT of the patient's left thigh reveals a 500 cc collection of blood in the limb. The patient suffered injury to that extremity prior to presentation. She is on Eliquis  for her history of paroxysmal atrial fibrillation. The patient's hemoglobin dropped to 7.4 on the night of 09/06/2024. She also had a low blood pressure at the time. She has received 1 unit in transfusion. Drops in hemoglobin have begun to slow down off of Eliquis  - Hb stable for the last 48 hours - restart eliquis  and monitor CBC in the am    AKI on CKD IIIb:  It appears that the patient's baseline creatinine is between 1.0 and 1.4. She has had AKI due to volume loss due to ongoing bleeding. S/p 1U PRBC - Cr now back to baseline of 1.03   Right upper quadrant pain.  Abnormal urinalysis noted but CT scan without any acute findings.  Keflex  had been prescribed from the ED.  Urinalysis showed 6-10 white cells with negative nitrate.  No further pain or nausea vomiting at this time - urine cultures with multiple species - off of Abx now    Generalized weakness fatigue, deconditioned and debilitated.  Will get PT OT evaluation.  Has a walker at home at baseline.   Morbid obesity obesity  Would benefit from lifestyle modification as outpatient.   History of paroxysmal atrial fibrillation Rate controlled at this time continue flecainide  metoprolol  home. Eliquis  was held for anemia as above, resumed now    Hypertension Hold losartan .  Continue metoprolol .  Continue to monitor blood pressure   Hypothyroidism.  Continue Synthroid .  TSH 2 months back was 2.2     DVT prophylaxis: SCDs Start: 09/04/24 1231 apixaban  (ELIQUIS ) tablet 5 mg    Code Status: Full Code Disposition Plan: Home with home health   Objective: Vitals:   09/09/24 0332 09/09/24 0452 09/09/24 0705 09/09/24 1051  BP: (!) 139/55  131/65 (!) 131/48  Pulse: 68  70 62  Resp: 14  18 18   Temp: 99 F (37.2 C)  98.4 F (36.9 C) 98.5 F (36.9 C)   TempSrc: Oral  Oral Oral  SpO2: 95%  96% 97%  Weight:  (!) 179.1 kg    Height:        Intake/Output Summary (Last 24 hours) at 09/09/2024 1338 Last data filed at 09/09/2024 1257 Gross per 24 hour  Intake 657 ml  Output 2100 ml  Net -1443 ml   Filed Weights   09/07/24 0518 09/08/24 0357 09/09/24 0452  Weight: (!) 177.2 kg (!) 175.6 kg (!) 179.1 kg    Examination:  Physical Exam Vitals and nursing note reviewed.  Constitutional:      General: She is not in acute distress. Cardiovascular:     Rate and Rhythm: Normal rate.  Pulmonary:     Effort: No respiratory distress.     Breath sounds: No wheezing.  Abdominal:     General: There is no distension.     Tenderness: There is no abdominal tenderness.  Skin:    Comments: Left thigh with ecchymosis      Data Reviewed: I have personally reviewed following labs and imaging studies  CBC: Recent Labs  Lab 09/04/24 0026 09/05/24 0132 09/05/24 0909 09/05/24 1442 09/05/24 1931 09/06/24 0342 09/06/24 1103 09/06/24 1906 09/07/24 0225 09/07/24 1228 09/08/24 0305 09/09/24 0241  WBC 10.8* 11.7*  --   --   --  10.7*  --   --  9.2  --  8.1 10.3  NEUTROABS 9.2*  --   --   --   --  8.1*  --   --  6.2  --  5.3 7.3  HGB 15.5* 10.6*   < >  --    < > 8.2*   < > 7.4* 7.9* 8.5* 8.4* 8.3*  HCT 49.2* 34.7*   < >  --    < > 26.3*   < > 23.9* 24.4* 26.3* 26.2* 26.4*  MCV 92.1 95.6  --   --   --  92.9  --   --  90.7  --  90.3 92.0  PLT 207 218  --  218  --  217  --   --  202  --  206 239   < > = values in this interval not displayed.   Basic Metabolic Panel: Recent Labs  Lab 09/05/24 0132 09/06/24 0342 09/07/24 0225 09/08/24 0305 09/09/24 0241  NA 138 137 134* 140 136  K 4.4 4.4 4.5 4.9 5.1  CL 97* 96* 95* 100 96*  CO2 33* 33* 30 34* 34*  GLUCOSE 105* 105* 100* 110* 104*  BUN 32* 45* 53* 48* 37*  CREATININE 1.81* 2.43* 2.57* 1.41* 1.03*  CALCIUM  8.4* 8.3* 7.9* 8.3* 8.6*  MG 2.3  --   --   --   --    GFR: Estimated  Creatinine Clearance: 80.9 mL/min (A) (by C-G formula based on SCr of 1.03 mg/dL (H)). Liver Function Tests: Recent Labs  Lab 09/04/24 0026 09/06/24 0342 09/09/24 0241  AST 29 18 21   ALT 12 10 10   ALKPHOS  120 70 63  BILITOT 0.9 0.4 0.7  PROT 8.9* 5.8* 6.1*  ALBUMIN 4.1 2.9* 3.0*   Recent Labs  Lab 09/04/24 0026  LIPASE 48   No results for input(s): AMMONIA in the last 168 hours. Coagulation Profile: Recent Labs  Lab 09/05/24 1442  INR 1.7*   Cardiac Enzymes: No results for input(s): CKTOTAL, CKMB, CKMBINDEX, TROPONINI in the last 168 hours. ProBNP, BNP (last 5 results) Recent Labs    09/04/24 0848  PROBNP 2,538.0*   HbA1C: No results for input(s): HGBA1C in the last 72 hours. CBG: No results for input(s): GLUCAP in the last 168 hours. Lipid Profile: No results for input(s): CHOL, HDL, LDLCALC, TRIG, CHOLHDL, LDLDIRECT in the last 72 hours. Thyroid  Function Tests: No results for input(s): TSH, T4TOTAL, FREET4, T3FREE, THYROIDAB in the last 72 hours. Anemia Panel: No results for input(s): VITAMINB12, FOLATE, FERRITIN, TIBC, IRON, RETICCTPCT in the last 72 hours. Sepsis Labs: No results for input(s): PROCALCITON, LATICACIDVEN in the last 168 hours.  Recent Results (from the past 240 hours)  Urine Culture (for pregnant, neutropenic or urologic patients or patients with an indwelling urinary catheter)     Status: Abnormal   Collection Time: 09/04/24  1:56 PM   Specimen: Urine, Clean Catch  Result Value Ref Range Status   Specimen Description URINE, CLEAN CATCH  Final   Special Requests   Final    NONE Performed at [facility], [address] [place], [place] [address]    Culture MULTIPLE SPECIES PRESENT, SUGGEST RECOLLECTION (A)  Final   Report Status 09/05/2024 FINAL  Final  MRSA Next Gen by PCR, Nasal     Status: None   Collection Time: 09/09/24  3:11 AM   Specimen: Nasal Mucosa; Nasal Swab  Result  Value Ref Range Status   MRSA by PCR Next Gen NOT DETECTED NOT DETECTED Final    Comment: (NOTE) The GeneXpert MRSA Assay (FDA approved for NASAL specimens only), is one component of a comprehensive MRSA colonization surveillance program. It is not intended to diagnose MRSA infection nor to guide or monitor treatment for MRSA infections. Test performance is not FDA approved in patients less than 83 years old. Performed at [facility], [address], [place], [place] [address]      Radiology Studies: No results found.  Scheduled Meds:  apixaban   5 mg Oral BID   escitalopram   5 mg Oral Daily   feeding supplement  237 mL Oral BID BM   flecainide   100 mg Oral BID   furosemide   40 mg Oral BID   levothyroxine   88 mcg Oral q morning   metoprolol  tartrate  50 mg Oral BID   pantoprazole   40 mg Oral Daily   sodium chloride  flush  3 mL Intravenous Q12H   Continuous Infusions:   LOS: 5 days   Time spent: 40 minutes  Casimer Dare, MD  [facility]  09/09/2024, 1:38 PM   "

## 2024-09-09 NOTE — Plan of Care (Signed)
 Patient ID: [name], female   DOB: 05-May-1951, 74 y.o.   MRN: [id number]  Problem: Education: Goal: Knowledge of General Education information will improve Description: Including pain rating scale, medication(s)/side effects and non-pharmacologic comfort measures Outcome: Progressing   Problem: Health Behavior/Discharge Planning: Goal: Ability to manage health-related needs will improve Outcome: Progressing   Problem: Clinical Measurements: Goal: Ability to maintain clinical measurements within normal limits will improve Outcome: Progressing Goal: Will remain free from infection Outcome: Progressing Goal: Diagnostic test results will improve Outcome: Progressing Goal: Respiratory complications will improve Outcome: Progressing Goal: Cardiovascular complication will be avoided Outcome: Progressing   Problem: Activity: Goal: Risk for activity intolerance will decrease Outcome: Progressing   Problem: Nutrition: Goal: Adequate nutrition will be maintained Outcome: Progressing   Problem: Coping: Goal: Level of anxiety will decrease Outcome: Progressing   Problem: Elimination: Goal: Will not experience complications related to bowel motility Outcome: Progressing Goal: Will not experience complications related to urinary retention Outcome: Progressing   Problem: Pain Managment: Goal: General experience of comfort will improve and/or be controlled Outcome: Progressing   Problem: Safety: Goal: Ability to remain free from injury will improve Outcome: Progressing   Problem: Skin Integrity: Goal: Risk for impaired skin integrity will decrease Outcome: Progressing   Problem: Education: Goal: Ability to demonstrate management of disease process will improve Outcome: Progressing Goal: Ability to verbalize understanding of medication therapies will improve Outcome: Progressing Goal: Individualized Educational Video(s) Outcome: Progressing   Problem:  Activity: Goal: Capacity to carry out activities will improve Outcome: Progressing   Problem: Cardiac: Goal: Ability to achieve and maintain adequate cardiopulmonary perfusion will improve Outcome: Progressing    Christine JONETTA Collier, RN

## 2024-09-10 LAB — BASIC METABOLIC PANEL WITH GFR
Anion gap: 6 (ref 5–15)
BUN: 30 mg/dL — ABNORMAL HIGH (ref 8–23)
CO2: 37 mmol/L — ABNORMAL HIGH (ref 22–32)
Calcium: 8.9 mg/dL (ref 8.9–10.3)
Chloride: 95 mmol/L — ABNORMAL LOW (ref 98–111)
Creatinine, Ser: 0.99 mg/dL (ref 0.44–1.00)
GFR, Estimated: 59 mL/min — ABNORMAL LOW
Glucose, Bld: 121 mg/dL — ABNORMAL HIGH (ref 70–99)
Potassium: 4.9 mmol/L (ref 3.5–5.1)
Sodium: 138 mmol/L (ref 135–145)

## 2024-09-10 LAB — CBC
HCT: 27.1 % — ABNORMAL LOW (ref 36.0–46.0)
Hemoglobin: 8.6 g/dL — ABNORMAL LOW (ref 12.0–15.0)
MCH: 29.3 pg (ref 26.0–34.0)
MCHC: 31.7 g/dL (ref 30.0–36.0)
MCV: 92.2 fL (ref 80.0–100.0)
Platelets: 260 K/uL (ref 150–400)
RBC: 2.94 MIL/uL — ABNORMAL LOW (ref 3.87–5.11)
RDW: 14.7 % (ref 11.5–15.5)
WBC: 9.5 K/uL (ref 4.0–10.5)
nRBC: 0 % (ref 0.0–0.2)

## 2024-09-10 NOTE — Progress Notes (Signed)
 " PROGRESS NOTE    [name]  FMW:[id number] DOB: [birth year] DOA: 09/04/2024 PCP: Clarice Nottingham, MD  Subjective: Patient reports feeling okay, no acute changes from yesterday. Worked with PT and felt weaker than her usual self.   Hospital Course:    Assessment and Plan: 74 year old female with a PMH of anxiety disorder, CHF, CKD, HTN, obesity, hypothyroidism, paroxysmal A-fib (on Eliquis ), who presented to the ED for right upper quadrant abdominal pain.  Initially thought to be due to UTI, but then patient became hypoxic, and had dyspnea on exertion and lower extremity edema.  She also reported to have injury to the left thigh prior to admission.  Initial imaging included CT of abdomen/pelvis that showed ascites and right middle lobe pulmonary nodule, and she also had elevated BNP.  She was diuresed for acute on chronic HFpEF.  Hospital course complicated by anemia, with hemoglobin dropping down to 7.4 for which she received 1 unit PRBC and her Eliquis  was held.  CT of the left thigh showed 500 mL collection of blood in her limb.  Hemoglobin has now stabilized, and eliquis  resumed   Assessment and Plan:   Acute hypoxic respiratory failure secondary to acute on chronic Diastolic CHF exacerbation.   Increasing shortness of breath, hypoxia secondary to CHF exacerbation..  Significant elevation of proBNP with trace pleural effusion and lower extremity edema dyspnea on exertion suggestive of CHF.  Upon presentation the patient had pulse ox between 72-80 percent on room air while she was ambulating. Needed 2 L of oxygen.    On 40 mg p.o. twice daily as outpatient but has not responded well at home. She was given IV Lasix  40 milligram twice daily on admission.  Echocardiogram has demonstrated EF of 70 - 75%. Grade 1 diastolic dysfunction. No regional wall motion abnormalities.  - she was diuresed with IV Lasix , now transitioned back to her home p.o. Lasix    Anemia/hematoma: Hemoglobin upon  presentation was 15.5. By 09/05/2024 the patient's hemoglobin had dropped to 10.8. CT of the patient's left thigh reveals a 500 cc collection of blood in the limb. The patient suffered injury to that extremity prior to presentation. She is on Eliquis  for her history of paroxysmal atrial fibrillation. The patient's hemoglobin dropped to 7.4 on the night of 09/06/2024. She also had a low blood pressure at the time. She has received 1 unit in transfusion. Drops in hemoglobin have begun to slow down off of Eliquis  - Hb stable in the 8 range now, restarted Eliquis  - Hb 8.6 now (from 8.3 yesterday)   AKI on CKD IIIb:  It appears that the patient's baseline creatinine is between 1.0 and 1.4. She has had AKI due to volume loss due to ongoing bleeding. S/p 1U PRBC - Cr now back to baseline    Right upper quadrant pain.  Abnormal urinalysis noted but CT scan without any acute findings.  Keflex  had been prescribed from the ED.  Urinalysis showed 6-10 white cells with negative nitrate.  No further pain or nausea vomiting at this time - urine cultures with multiple species - off of Abx now    Generalized weakness fatigue, deconditioned and debilitated.   - PT/OT eval, requiring 2A and recommending SNF    Morbid obesity obesity   Would benefit from lifestyle modification as outpatient.   History of paroxysmal atrial fibrillation Rate controlled at this time continue flecainide  metoprolol  home. Eliquis  was held for anemia as above, resumed now    Hypertension Hold losartan .  Continue metoprolol .  Continue to monitor blood pressure   Hypothyroidism.  Continue Synthroid .  TSH 2 months back was 2.2   DVT prophylaxis: SCDs Start: 09/04/24 1231 apixaban  (ELIQUIS ) tablet 5 mg    Code Status: Full Code Disposition Plan: SNF rehab vs home heath   Objective: Vitals:   09/10/24 0330 09/10/24 0500 09/10/24 0716 09/10/24 1049  BP: (!) 150/67  (!) 140/54 139/60  Pulse: 72  (!) 57 60  Resp: (!) 22  20 19    Temp: 98.9 F (37.2 C)  98.9 F (37.2 C) 98.4 F (36.9 C)  TempSrc: Oral  Oral Oral  SpO2: 90%  96% 95%  Weight:  (!) 178 kg    Height:        Intake/Output Summary (Last 24 hours) at 09/10/2024 1426 Last data filed at 09/10/2024 1050 Gross per 24 hour  Intake 717 ml  Output 2050 ml  Net -1333 ml   Filed Weights   09/08/24 0357 09/09/24 0452 09/10/24 0500  Weight: (!) 175.6 kg (!) 179.1 kg (!) 178 kg    Examination:  Physical Exam Vitals and nursing note reviewed.  Constitutional:      General: She is not in acute distress.    Appearance: She is obese.  Cardiovascular:     Rate and Rhythm: Normal rate.  Pulmonary:     Effort: No respiratory distress.     Breath sounds: No wheezing.  Abdominal:     General: There is no distension.     Tenderness: There is no abdominal tenderness.  Skin:    Comments: Left high hematoma/ecchymosis stable from yesterday      Data Reviewed: I have personally reviewed following labs and imaging studies  CBC: Recent Labs  Lab 09/04/24 0026 09/05/24 0132 09/06/24 0342 09/06/24 1103 09/07/24 0225 09/07/24 1228 09/08/24 0305 09/09/24 0241 09/10/24 0921  WBC 10.8*   < > 10.7*  --  9.2  --  8.1 10.3 9.5  NEUTROABS 9.2*  --  8.1*  --  6.2  --  5.3 7.3  --   HGB 15.5*   < > 8.2*   < > 7.9* 8.5* 8.4* 8.3* 8.6*  HCT 49.2*   < > 26.3*   < > 24.4* 26.3* 26.2* 26.4* 27.1*  MCV 92.1   < > 92.9  --  90.7  --  90.3 92.0 92.2  PLT 207   < > 217  --  202  --  206 239 260   < > = values in this interval not displayed.   Basic Metabolic Panel: Recent Labs  Lab 09/05/24 0132 09/06/24 0342 09/07/24 0225 09/08/24 0305 09/09/24 0241 09/10/24 0921  NA 138 137 134* 140 136 138  K 4.4 4.4 4.5 4.9 5.1 4.9  CL 97* 96* 95* 100 96* 95*  CO2 33* 33* 30 34* 34* 37*  GLUCOSE 105* 105* 100* 110* 104* 121*  BUN 32* 45* 53* 48* 37* 30*  CREATININE 1.81* 2.43* 2.57* 1.41* 1.03* 0.99  CALCIUM  8.4* 8.3* 7.9* 8.3* 8.6* 8.9  MG 2.3  --   --   --   --    --    GFR: Estimated Creatinine Clearance: 83.9 mL/min (by C-G formula based on SCr of 0.99 mg/dL). Liver Function Tests: Recent Labs  Lab 09/04/24 0026 09/06/24 0342 09/09/24 0241  AST 29 18 21   ALT 12 10 10   ALKPHOS 120 70 63  BILITOT 0.9 0.4 0.7  PROT 8.9* 5.8* 6.1*  ALBUMIN 4.1 2.9* 3.0*  Recent Labs  Lab 09/04/24 0026  LIPASE 48   No results for input(s): AMMONIA in the last 168 hours. Coagulation Profile: Recent Labs  Lab 09/05/24 1442  INR 1.7*   Cardiac Enzymes: No results for input(s): CKTOTAL, CKMB, CKMBINDEX, TROPONINI in the last 168 hours. ProBNP, BNP (last 5 results) Recent Labs    09/04/24 0848  PROBNP 2,538.0*   HbA1C: No results for input(s): HGBA1C in the last 72 hours. CBG: No results for input(s): GLUCAP in the last 168 hours. Lipid Profile: No results for input(s): CHOL, HDL, LDLCALC, TRIG, CHOLHDL, LDLDIRECT in the last 72 hours. Thyroid  Function Tests: No results for input(s): TSH, T4TOTAL, FREET4, T3FREE, THYROIDAB in the last 72 hours. Anemia Panel: No results for input(s): VITAMINB12, FOLATE, FERRITIN, TIBC, IRON, RETICCTPCT in the last 72 hours. Sepsis Labs: No results for input(s): PROCALCITON, LATICACIDVEN in the last 168 hours.  Recent Results (from the past 240 hours)  Urine Culture (for pregnant, neutropenic or urologic patients or patients with an indwelling urinary catheter)     Status: Abnormal   Collection Time: 09/04/24  1:56 PM   Specimen: Urine, Clean Catch  Result Value Ref Range Status   Specimen Description URINE, CLEAN CATCH  Final   Special Requests   Final    NONE Performed at [facility], [address], [place], [place] [address]    Culture MULTIPLE SPECIES PRESENT, SUGGEST RECOLLECTION (A)  Final   Report Status 09/05/2024 FINAL  Final  MRSA Next Gen by PCR, Nasal     Status: None   Collection Time: 09/09/24  3:11 AM   Specimen: Nasal Mucosa;  Nasal Swab  Result Value Ref Range Status   MRSA by PCR Next Gen NOT DETECTED NOT DETECTED Final    Comment: (NOTE) The GeneXpert MRSA Assay (FDA approved for NASAL specimens only), is one component of a comprehensive MRSA colonization surveillance program. It is not intended to diagnose MRSA infection nor to guide or monitor treatment for MRSA infections. Test performance is not FDA approved in patients less than 22 years old. Performed at [facility], [address], [place], [place] [address]      Radiology Studies: No results found.  Scheduled Meds:  apixaban   5 mg Oral BID   escitalopram   5 mg Oral Daily   feeding supplement  237 mL Oral BID BM   flecainide   100 mg Oral BID   furosemide   40 mg Oral BID   levothyroxine   88 mcg Oral q morning   metoprolol  tartrate  50 mg Oral BID   pantoprazole   40 mg Oral Daily   sodium chloride  flush  3 mL Intravenous Q12H   Continuous Infusions:   LOS: 6 days   Time spent: 40 minutes  Casimer Dare, MD  [facility]  09/10/2024, 2:26 PM   "

## 2024-09-10 NOTE — Care Management Important Message (Signed)
 Important Message  Patient Details  Name: [name] MRN: [id number] Date of Birth: 05-Dec-1950   Important Message Given:  Yes - Medicare IM     Vonzell Arrie Sharps 09/10/2024, 8:09 AM

## 2024-09-10 NOTE — Progress Notes (Signed)
 Physical Therapy Treatment Patient Details Name: [name] MRN: [id number] DOB: May 14, 1951 Today's Date: 09/10/2024   History of Present Illness 74 y.o. female presents to [facility] 09/04/24 with R upper quadrant pain, generalized weakness/fatigue, and SOB. CT scan showed ascites w/ sigmoid diverticulosis and R middle lobe pulmonary nodule. Also with acute hypoxic respiratory failure 2/2 acute on chronic CHF exacerbation, +UTI. PMHx: anxiety disorder, congestive heart failure, CKD, hypertension, class III obesity, hypothyroidism, paroxysmal atrial fibrillation and splenic infarct    PT Comments  Pt resting in bed on arrival, eager for mobility and demonstrating continued progress towards acute goals. Pt continues to be limited in safe mobility by weakness, decreased activity tolerance and impaired balance/postural reactions. Pt performing bed mobility, transfers sit<>stand and taking a few lateral steps with rollator support with grossly min-mod A. Pt requiring increased assist, up to max A to return Les to bed at end of session.  Pt declining attempts at ambulation away from EOB this session due to fatigue, however sitting up ~76mins to perform seated ADLs. Current plan remains appropriate to address deficits and maximize functional independence and decrease caregiver burden. Pt continues to benefit from skilled PT services to progress toward functional mobility goals.     If plan is discharge home, recommend the following: Two people to help with walking and/or transfers;Two people to help with bathing/dressing/bathroom;Assistance with cooking/housework;Assist for transportation;Help with stairs or ramp for entrance   Can travel by private vehicle     No  Equipment Recommendations  Hospital bed;Hoyer lift;Wheelchair cushion (measurements PT);Wheelchair (measurements PT);BSC/3in1 (bariatric)    Recommendations for Other Services       Precautions / Restrictions Precautions Precautions:  Fall Recall of Precautions/Restrictions: Intact Restrictions Weight Bearing Restrictions Per Provider Order: No     Mobility  Bed Mobility Overal bed mobility: Needs Assistance Bed Mobility: Supine to Sit, Sit to Supine     Supine to sit: Min assist Sit to supine: Max assist   General bed mobility comments: with increased time and use of bed features pt able to bring LEs to and off EOB, min A to elevate trunk to sitting, max A to bring LEs back into bed    Transfers Overall transfer level: Needs assistance Equipment used: Rollator (4 wheels) Transfers: Sit to/from Stand, Bed to chair/wheelchair/BSC Sit to Stand: Mod assist, From elevated surface           General transfer comment: pt standing from elevated EOB with mod A, increased time to elevate trunk upright    Ambulation/Gait Ambulation/Gait assistance: Min assist Gait Distance (Feet): 3 Feet Assistive device: Rollator (4 wheels) Gait Pattern/deviations: Step-to pattern, Decreased step length - left, Decreased stance time - left, Decreased stride length, Knee flexed in stance - right, Knee flexed in stance - left, Antalgic Gait velocity: reduced     General Gait Details: pt able to take lateral steps along EOB to [facility] with personal rollator support light min A to manage AD   Stairs             Wheelchair Mobility     Tilt Bed    Modified Rankin (Stroke Patients Only)       Balance Overall balance assessment: Needs assistance Sitting-balance support: No upper extremity supported, Feet supported Sitting balance-Leahy Scale: Good Sitting balance - Comments: seated EOB without UE support and no LOB   Standing balance support: Bilateral upper extremity supported, During functional activity, Reliant on assistive device for balance Standing balance-Leahy Scale: Poor Standing balance comment: reliant  on external support for stability                            Communication  Communication Communication: No apparent difficulties  Cognition Arousal: Alert Behavior During Therapy: WFL for tasks assessed/performed                             Following commands: Impaired Following commands impaired: Follows one step commands with increased time    Cueing Cueing Techniques: Verbal cues, Gestural cues, Tactile cues  Exercises      General Comments General comments (skin integrity, edema, etc.): SpO2 89-95% on RA throughout      Pertinent Vitals/Pain Pain Assessment Pain Assessment: Faces Faces Pain Scale: Hurts even more Pain Location: BLE during bed mobility Pain Descriptors / Indicators: Aching, Grimacing, Guarding, Discomfort Pain Intervention(s): Monitored during session, Limited activity within patient's tolerance    Home Living                          Prior Function            PT Goals (current goals can now be found in the care plan section) Acute Rehab PT Goals Patient Stated Goal: to be stronger PT Goal Formulation: With patient Time For Goal Achievement: 09/19/24 Progress towards PT goals: Progressing toward goals    Frequency    Min 2X/week      PT Plan      Co-evaluation              AM-PAC PT 6 Clicks Mobility   Outcome Measure  Help needed turning from your back to your side while in a flat bed without using bedrails?: A Lot Help needed moving from lying on your back to sitting on the side of a flat bed without using bedrails?: A Lot Help needed moving to and from a bed to a chair (including a wheelchair)?: A Little Help needed standing up from a chair using your arms (e.g., wheelchair or bedside chair)?: A Lot Help needed to walk in hospital room?: Total Help needed climbing 3-5 steps with a railing? : Total 6 Click Score: 11    End of Session   Activity Tolerance: Patient limited by pain Patient left: in bed;with call bell/phone within reach;with bed alarm set Nurse Communication:  Mobility status PT Visit Diagnosis: Unsteadiness on feet (R26.81);Other abnormalities of gait and mobility (R26.89);Muscle weakness (generalized) (M62.81);History of falling (Z91.81)     Time: 8940-8875 PT Time Calculation (min) (ACUTE ONLY): 25 min  Charges:    $Therapeutic Activity: 23-37 mins PT General Charges $$ ACUTE PT VISIT: 1 Visit                     Mahek Schlesinger R. PTA Acute Rehabilitation Services Office: [phone]   Therisa CHRISTELLA Boor 09/10/2024, 2:05 PM

## 2024-09-10 NOTE — Plan of Care (Signed)
" °  Problem: Education: Goal: Knowledge of General Education information will improve Description: Including pain rating scale, medication(s)/side effects and non-pharmacologic comfort measures Outcome: Progressing   Problem: Clinical Measurements: Goal: Will remain free from infection Outcome: Progressing Goal: Respiratory complications will improve Outcome: Progressing Goal: Cardiovascular complication will be avoided Outcome: Progressing   Problem: Activity: Goal: Risk for activity intolerance will decrease Outcome: Progressing   Problem: Safety: Goal: Ability to remain free from injury will improve Outcome: Progressing   Problem: Education: Goal: Ability to demonstrate management of disease process will improve Outcome: Progressing Goal: Ability to verbalize understanding of medication therapies will improve Outcome: Progressing   Problem: Cardiac: Goal: Ability to achieve and maintain adequate cardiopulmonary perfusion will improve Outcome: Progressing   "

## 2024-09-10 NOTE — Plan of Care (Addendum)
 Patient ID: [name] Her, female   DOB: Jan 13, 1951, 74 y.o.   MRN: [id number]  Problem: Education: Goal: Knowledge of General Education information will improve Description: Including pain rating scale, medication(s)/side effects and non-pharmacologic comfort measures Outcome: Progressing   Problem: Health Behavior/Discharge Planning: Goal: Ability to manage health-related needs will improve Outcome: Progressing   Problem: Clinical Measurements: Goal: Ability to maintain clinical measurements within normal limits will improve Outcome: Progressing Goal: Will remain free from infection Outcome: Progressing Goal: Diagnostic test results will improve Outcome: Progressing Goal: Respiratory complications will improve Outcome: Progressing Goal: Cardiovascular complication will be avoided Outcome: Progressing   Problem: Activity: Goal: Risk for activity intolerance will decrease Outcome: Progressing   Problem: Nutrition: Goal: Adequate nutrition will be maintained Outcome: Progressing   Problem: Coping: Goal: Level of anxiety will decrease Outcome: Not Progressing [facility] unable to take patient Impact: Minimal Note: Patient states she is depressed because the facilities she has chosen do not have any bariatric beds at this time. Encouraged patient to keep looking through list and work with case manager to find placement.    [name], RN

## 2024-09-10 NOTE — TOC Progression Note (Addendum)
 Transition of Care [facility]) - Progression Note    Patient Details  Name: [name] MRN: [id number] Date of Birth: 1950-09-28  Transition [facility] Care [facility]) CM/SW Contact  Luise JAYSON Pan, [place] Phone Number: 09/10/2024, 12:06 PM  Clinical Narrative:   CSW followed up with patient with same bed offers for SNF. Patient inquired about [facility] and [facility]. CSW informed patient that those facilities are not able to accommodate for patients bari needs. Patient asked for time to think about bed offers.  [facility] stated they can accommodate for patients bari bed needs as the limit is 400 lbs. [facility] is unable to at this time as the bari bed weight limit is 350 lbs. CSW following up with other considering status facilities ([facility], [facility], [facility]).   12:38 PM Bedside RN informed CSW that patient would like CSW to look at [facility]. CSW informed bedside RN that [facility] has declined at this time.  12:46 PM CSW informed patient that [facility] has declined and [facility] rescinded bed offer. CSW awaiting responses from [facility] and [facility]). Patient informed CSW that she'll have a decision by end of day.  3:19 PM CSW informed patient that [facility] has offered a bed. Patient would like to move forward with [facility] at this time. CSW will notify facility and start auth for SNF and PTAR.  3:53 PM CSW provided patient the medicare.gov rating for [facility] at bedside. Patient got emotional and stated why do I have to go through this. Patient stated she may end up having to go home. CSW encouraged patient to reach out to family to help assist with the overall decision of SNF vs Home as patient needs assistance. CSW reminded patient that she does have the autonomy to make her own decisions as well. CSW encouraged patient to think about the decision of going to a facility overnight as she is not discharging at this time. CSW to follow up in the morning.   CSW will continue to  follow.    Expected Discharge Plan: Skilled Nursing Facility Barriers to Discharge: Continued Medical Work up, Other (must enter comment), English As A Second Language Teacher (Patient decision on SNF)               Expected Discharge Plan and Services In-house Referral: Clinical Social Work Discharge Planning Services: CM Consult Post Acute Care Choice: Home Health Living arrangements for the past 2 months: Single Family Home                                       Social Drivers of Health (SDOH) Interventions SDOH Screenings   Food Insecurity: No Food Insecurity (09/05/2024)  Housing: Low Risk (09/05/2024)  Transportation Needs: No Transportation Needs (09/05/2024)  Utilities: Not At Risk (09/05/2024)  Social Connections: Socially Isolated (09/05/2024)  Tobacco Use: Medium Risk (09/04/2024)  Health Literacy: Adequate Health Literacy (05/31/2023)    Readmission Risk Interventions     No data to display

## 2024-09-10 NOTE — TOC Progression Note (Signed)
 Transition of Care [facility] - Progression Note    Patient Details  Name: [name] MRN: [id number] Date of Birth: 28-Apr-1951  [facility]) CM/SW Contact  Waddell Barnie Rama, RN Phone Number: 09/10/2024, 11:05 AM  Clinical Narrative:    This NCM was informed during progression that now patient wants to go to SNF.  CSW is aware.   Expected Discharge Plan: Home w Home Health Services Barriers to Discharge: Continued Medical Work up               Expected Discharge Plan and Services In-house Referral: Clinical Social Work Discharge Planning Services: CM Consult Post Acute Care Choice: Home Health Living arrangements for the past 2 months: Single Family Home                                       Social Drivers of Health (SDOH) Interventions SDOH Screenings   Food Insecurity: No Food Insecurity (09/05/2024)  Housing: Low Risk (09/05/2024)  Transportation Needs: No Transportation Needs (09/05/2024)  Utilities: Not At Risk (09/05/2024)  Social Connections: Socially Isolated (09/05/2024)  Tobacco Use: Medium Risk (09/04/2024)  Health Literacy: Adequate Health Literacy (05/31/2023)    Readmission Risk Interventions     No data to display

## 2024-09-11 DIAGNOSIS — E039 Hypothyroidism, unspecified: Secondary | ICD-10-CM | POA: Diagnosis not present

## 2024-09-11 DIAGNOSIS — E66813 Obesity, class 3: Secondary | ICD-10-CM | POA: Diagnosis not present

## 2024-09-11 DIAGNOSIS — I480 Paroxysmal atrial fibrillation: Secondary | ICD-10-CM | POA: Diagnosis not present

## 2024-09-11 DIAGNOSIS — I10 Essential (primary) hypertension: Secondary | ICD-10-CM | POA: Diagnosis not present

## 2024-09-11 DIAGNOSIS — N179 Acute kidney failure, unspecified: Secondary | ICD-10-CM | POA: Diagnosis not present

## 2024-09-11 DIAGNOSIS — F32A Depression, unspecified: Secondary | ICD-10-CM | POA: Diagnosis not present

## 2024-09-11 DIAGNOSIS — I5033 Acute on chronic diastolic (congestive) heart failure: Secondary | ICD-10-CM | POA: Diagnosis not present

## 2024-09-11 LAB — BPAM RBC
Blood Product Expiration Date: 202601302359
Blood Product Expiration Date: 202602022359
ISSUE DATE / TIME: 202601170527
Unit Type and Rh: 1700
Unit Type and Rh: 1700

## 2024-09-11 LAB — TYPE AND SCREEN
ABO/RH(D): B NEG
Antibody Screen: POSITIVE
Donor AG Type: NEGATIVE
Donor AG Type: NEGATIVE
Unit division: 0
Unit division: 0

## 2024-09-11 MED ORDER — SPIRONOLACTONE 12.5 MG HALF TABLET
12.5000 mg | ORAL_TABLET | Freq: Every day | ORAL | Status: DC
  Administered 2024-09-11 – 2024-09-13 (×3): 12.5 mg via ORAL
  Filled 2024-09-11 (×3): qty 1

## 2024-09-11 NOTE — Assessment & Plan Note (Addendum)
 Continue rate control with metoprolol  and anticoagulation. Apixaban   Continue flecainide 

## 2024-09-11 NOTE — Assessment & Plan Note (Signed)
 Continue levothyroxine 

## 2024-09-11 NOTE — Assessment & Plan Note (Signed)
 Continue blood pressure monitoring, will transition from lisinopril to losartan . Check echocardiogram and continue diuresis.

## 2024-09-11 NOTE — Progress Notes (Signed)
 Physical Therapy Treatment Patient Details Name: [name] MRN: [id number] DOB: 03-20-1951 Today's Date: 09/11/2024   History of Present Illness 74 y.o. female presents to [facility] 09/04/24 with R upper quadrant pain, generalized weakness/fatigue, and SOB. CT scan showed ascites w/ sigmoid diverticulosis and R middle lobe pulmonary nodule. Also with acute hypoxic respiratory failure 2/2 acute on chronic CHF exacerbation, +UTI. PMHx: anxiety disorder, congestive heart failure, CKD, hypertension, class III obesity, hypothyroidism, paroxysmal atrial fibrillation and splenic infarct    PT Comments  Pt resting in bed on arrival, pleasant and agreeable to session with encouragement as pt stating she is having more soreness in L thigh than in prior days. Pt demonstrating slow but steady progress towards acute goals, however continues to be limited by deceased activity tolerance, poor balance/postural reactions and weakness. Pt requiring grossly min A to complete bed mobility, transfers sit<>stand from elevated EOB and step pivot transfers with personal rollator support. Pt requiring heavy mod A to stand from low BSC and max A to return Les to bed at end of session. Pt continues to benefit from skilled PT services to progress toward functional mobility goals.     If plan is discharge home, recommend the following: Two people to help with walking and/or transfers;Two people to help with bathing/dressing/bathroom;Assistance with cooking/housework;Assist for transportation;Help with stairs or ramp for entrance   Can travel by private vehicle     No  Equipment Recommendations  Hospital bed;Hoyer lift;Wheelchair cushion (measurements PT);Wheelchair (measurements PT);BSC/3in1 (bariatric)    Recommendations for Other Services       Precautions / Restrictions Precautions Precautions: Fall Recall of Precautions/Restrictions: Intact Restrictions Weight Bearing Restrictions Per Provider Order: No      Mobility  Bed Mobility Overal bed mobility: Needs Assistance Bed Mobility: Supine to Sit, Sit to Supine     Supine to sit: Min assist Sit to supine: Max assist   General bed mobility comments: with increased time and use of bed features pt able to bring LEs to and off EOB, min A to elevate trunk to sitting, max A to bring LEs back into bed    Transfers Overall transfer level: Needs assistance Equipment used: Rollator (4 wheels) Transfers: Sit to/from Stand, Bed to chair/wheelchair/BSC Sit to Stand: Mod assist, From elevated surface, Min assist   Step pivot transfers: Min assist       General transfer comment: pt standing from elevated EOB with min A, mod A needed to boost from lower BSC,  increased time to elevate trunk upright, min A to manage rollator and lines to step around from EOB<>BSC    Ambulation/Gait Ambulation/Gait assistance: Min assist Gait Distance (Feet): 3 Feet Assistive device: Rollator (4 wheels) Gait Pattern/deviations: Step-to pattern, Decreased step length - left, Decreased stance time - left, Decreased stride length, Knee flexed in stance - right, Knee flexed in stance - left, Antalgic Gait velocity: reduced     General Gait Details: pt able to take lateral steps along EOB to [facility] with personal rollator support light min A to manage AD   Stairs             Wheelchair Mobility     Tilt Bed    Modified Rankin (Stroke Patients Only)       Balance Overall balance assessment: Needs assistance Sitting-balance support: No upper extremity supported, Feet supported Sitting balance-Leahy Scale: Good Sitting balance - Comments: seated EOB without UE support and no LOB   Standing balance support: Bilateral upper extremity supported, During functional  activity, Reliant on assistive device for balance Standing balance-Leahy Scale: Poor Standing balance comment: reliant on external support for stability                             Communication Communication Communication: No apparent difficulties  Cognition Arousal: Alert Behavior During Therapy: WFL for tasks assessed/performed                             Following commands: Impaired Following commands impaired: Follows multi-step commands with increased time    Cueing Cueing Techniques: Verbal cues, Gestural cues, Tactile cues  Exercises      General Comments General comments (skin integrity, edema, etc.): VSS on supplemental O2      Pertinent Vitals/Pain Pain Assessment Pain Assessment: Faces Faces Pain Scale: Hurts little more Pain Location: L lateral tigh Pain Descriptors / Indicators: Grimacing, Guarding, Discomfort, Sore Pain Intervention(s): Monitored during session, Limited activity within patient's tolerance, Repositioned    Home Living                          Prior Function            PT Goals (current goals can now be found in the care plan section) Acute Rehab PT Goals Patient Stated Goal: to be stronger PT Goal Formulation: With patient Time For Goal Achievement: 09/19/24 Progress towards PT goals: Progressing toward goals    Frequency    Min 2X/week      PT Plan      Co-evaluation              AM-PAC PT 6 Clicks Mobility   Outcome Measure  Help needed turning from your back to your side while in a flat bed without using bedrails?: A Lot Help needed moving from lying on your back to sitting on the side of a flat bed without using bedrails?: A Lot Help needed moving to and from a bed to a chair (including a wheelchair)?: A Little Help needed standing up from a chair using your arms (e.g., wheelchair or bedside chair)?: A Lot Help needed to walk in hospital room?: Total Help needed climbing 3-5 steps with a railing? : Total 6 Click Score: 11    End of Session Equipment Utilized During Treatment: Oxygen Activity Tolerance: Patient limited by pain;Patient tolerated treatment  well Patient left: in bed;with call bell/phone within reach;with bed alarm set Nurse Communication: Mobility status PT Visit Diagnosis: Unsteadiness on feet (R26.81);Other abnormalities of gait and mobility (R26.89);Muscle weakness (generalized) (M62.81);History of falling (Z91.81)     Time: 1420-1450 PT Time Calculation (min) (ACUTE ONLY): 30 min  Charges:    $Therapeutic Activity: 23-37 mins PT General Charges $$ ACUTE PT VISIT: 1 Visit                     Chas Axel R. PTA Acute Rehabilitation Services Office: [phone]   Therisa CHRISTELLA Boor 09/11/2024, 3:04 PM

## 2024-09-11 NOTE — TOC Progression Note (Signed)
 Transition of Care [facility]) - Progression Note    Patient Details  Name: [name] MRN: [id number] Date of Birth: 1951/02/01  Transition of Care [facility]) CM/SW Contact  Luise JAYSON Pan, [place] Phone Number: 09/11/2024, 4:33 PM  Clinical Narrative:   CSW followed up with patient and she is agreeable to going to SNF at [facility]. Auth with [facility] is still currently pending.  CSW will continue to follow.      Expected Discharge Plan: Skilled Nursing Facility Barriers to Discharge: Continued Medical Work up, Other (must enter comment), English As A Second Language Teacher (Patient decision on SNF)               Expected Discharge Plan and Services In-house Referral: Clinical Social Work Discharge Planning Services: CM Consult Post Acute Care Choice: Home Health Living arrangements for the past 2 months: Single Family Home                                       Social Drivers of Health (SDOH) Interventions SDOH Screenings   Food Insecurity: No Food Insecurity (09/05/2024)  Housing: Low Risk (09/05/2024)  Transportation Needs: No Transportation Needs (09/05/2024)  Utilities: Not At Risk (09/05/2024)  Social Connections: Socially Isolated (09/05/2024)  Tobacco Use: Medium Risk (09/04/2024)  Health Literacy: Adequate Health Literacy (05/31/2023)    Readmission Risk Interventions     No data to display

## 2024-09-11 NOTE — Progress Notes (Signed)
" °  Progress Note   Patient: EULETA BELSON FMW:[id number] DOB: 06/07/1951 DOA: 09/04/2024     7 DOS: the patient was seen and examined on 09/11/2024   Brief hospital course: 74 year old female with a PMH of anxiety disorder, CHF, CKD, HTN, obesity, hypothyroidism, paroxysmal A-fib (on Eliquis ), who presented to the ED for right upper quadrant abdominal pain.  Initially thought to be due to UTI, but then patient became hypoxic, and had dyspnea on exertion and lower extremity edema.  She also reported to have injury to the left thigh prior to admission.  Initial imaging included CT of abdomen/pelvis that showed ascites and right middle lobe pulmonary nodule, and she also had elevated BNP.  She was diuresed for acute on chronic HFpEF.  Hospital course complicated by anemia, with hemoglobin dropping down to 7.4 for which she received 1 unit PRBC and her Eliquis  was held.  CT of the left thigh showed 500 mL collection of blood in her limb.  Hemoglobin has now stabilized, and eliquis  resumed   Assessment and Plan: * Acute on chronic diastolic CHF (congestive heart failure) (HCC) Echocardiogram with preserved LV systolic function with EF 70 to 75%, mild LVH, grade I diastolic dysfunction with impaired relaxation, RV systolic function preserved, LA with mild dilatation, no significant valvular disease.   Urine output is 3,750 ml Systolic blood pressure 120 to 140 mmHg.   Plan to furosemide  40 mg po bid Metoprolol  and will add spironolactone    Essential hypertension Continue blood pressure control with metoprolol .   Paroxysmal atrial fibrillation (HCC) Continue rate control with metoprolol  and anticoagulation. Apixaban   Continue telemetry monitoring   Hypothyroidism Continue levothyroxine    Depression Continue with escitalopram   Obesity, class 3 (HCC) Calculated BMI is 62,6      Subjective: Patient with no chest pain or dyspnea, edema has improved, continue very weak and deconditioned    Physical Exam: Vitals:   09/11/24 0316 09/11/24 0500 09/11/24 0715 09/11/24 1116  BP: 138/60  (!) 131/50 (!) 122/54  Pulse: 62  (!) 56   Resp: 18     Temp: 99.2 F (37.3 C)  98.7 F (37.1 C) 98.5 F (36.9 C)  TempSrc: Oral  Oral Oral  SpO2: 97%  96%   Weight:  (!) 181.5 kg    Height:       Neurology awake and alert ENT with mild pallor  Cardiovascular with S1 and S2 present and regular with no gallops or rubs Respiratory with no wheezing or rhonchi Abdomen soft non tender and not distended Lower extremity with no edema, positive distal discoloration and dry skin no ope wound Left thigh ecchymosis   Data Reviewed:    Family Communication: no family at the bedside   Disposition: Status is: Inpatient Remains inpatient appropriate because: Pending placement   Planned Discharge Destination: Skilled nursing facility     Author: Elidia Toribio Furnace, MD 09/11/2024 1:45 PM  For on call review [email or web address].  "

## 2024-09-11 NOTE — Plan of Care (Signed)
  Problem: Education: Goal: Knowledge of General Education information will improve Description Including pain rating scale, medication(s)/side effects and non-pharmacologic comfort measures Outcome: Progressing   Problem: Clinical Measurements: Goal: Ability to maintain clinical measurements within normal limits will improve Outcome: Progressing Goal: Will remain free from infection Outcome: Progressing   Problem: Safety: Goal: Ability to remain free from injury will improve Outcome: Progressing   Problem: Cardiac: Goal: Ability to achieve and maintain adequate cardiopulmonary perfusion will improve Outcome: Progressing

## 2024-09-11 NOTE — Assessment & Plan Note (Signed)
Continue with escitalopram.  ?

## 2024-09-11 NOTE — Assessment & Plan Note (Signed)
 Calculated BMI is 62,6

## 2024-09-11 NOTE — Assessment & Plan Note (Signed)
 Echocardiogram with preserved LV systolic function with EF 70 to 75%, mild LVH, grade I diastolic dysfunction with impaired relaxation, RV systolic function preserved, LA with mild dilatation, no significant valvular disease.   Urine output is 3,750 ml Systolic blood pressure 120 to 140 mmHg.   Plan to furosemide  40 mg po bid Metoprolol  and will add spironolactone 

## 2024-09-12 DIAGNOSIS — I5033 Acute on chronic diastolic (congestive) heart failure: Secondary | ICD-10-CM | POA: Diagnosis not present

## 2024-09-12 DIAGNOSIS — I480 Paroxysmal atrial fibrillation: Secondary | ICD-10-CM | POA: Diagnosis not present

## 2024-09-12 DIAGNOSIS — E039 Hypothyroidism, unspecified: Secondary | ICD-10-CM | POA: Diagnosis not present

## 2024-09-12 DIAGNOSIS — I10 Essential (primary) hypertension: Secondary | ICD-10-CM | POA: Diagnosis not present

## 2024-09-12 LAB — HEMOGLOBIN AND HEMATOCRIT, BLOOD
HCT: 31.9 % — ABNORMAL LOW (ref 36.0–46.0)
Hemoglobin: 9.9 g/dL — ABNORMAL LOW (ref 12.0–15.0)

## 2024-09-12 LAB — BASIC METABOLIC PANEL WITH GFR
Anion gap: 5 (ref 5–15)
BUN: 36 mg/dL — ABNORMAL HIGH (ref 8–23)
CO2: 41 mmol/L — ABNORMAL HIGH (ref 22–32)
Calcium: 9.9 mg/dL (ref 8.9–10.3)
Chloride: 94 mmol/L — ABNORMAL LOW (ref 98–111)
Creatinine, Ser: 1.08 mg/dL — ABNORMAL HIGH (ref 0.44–1.00)
GFR, Estimated: 53 mL/min — ABNORMAL LOW
Glucose, Bld: 106 mg/dL — ABNORMAL HIGH (ref 70–99)
Potassium: 4.4 mmol/L (ref 3.5–5.1)
Sodium: 139 mmol/L (ref 135–145)

## 2024-09-12 MED ORDER — GUAIFENESIN-DM 100-10 MG/5ML PO SYRP: 5.0000 mL | ORAL_SOLUTION | ORAL | Status: DC | PRN

## 2024-09-12 NOTE — Progress Notes (Signed)
 Mobility Specialist Progress Note:    09/12/24 0941  Mobility  Activity Pivoted/transferred from bed to chair;Pivoted/transferred to/from [facility];Ambulated with assistance  Level of Assistance Moderate assist, patient does 50-74% (+2)  Assistive Device Four wheel walker;Other (Comment) (HHA)  Distance Ambulated (ft) 5 ft  Range of Motion/Exercises Active  Activity Response Tolerated fair  Mobility Referral Yes  Mobility visit 1 Mobility  Mobility Specialist Start Time (ACUTE ONLY) 0941  Mobility Specialist Stop Time (ACUTE ONLY) 1000  Mobility Specialist Time Calculation (min) (ACUTE ONLY) 19 min   Received pt laying in bed; NT requesting assistance to help clean and pt wanting to ambulate/sit up. No c/o any symptoms. Pt somewhat anxious but otherwise doing fine. Pt needing ModA in order to transfer to EOB and +2 ModA in order to stand. Pt able to take steps to transfer to [facility]. From there, pt able to stand for several moments for care before taking steps to get in recliner. Pt left in recliner w/ NT in room and all needs met.   Venetia Keel Mobility Specialist Please Neurosurgeon or Rehab Office at [phone]

## 2024-09-12 NOTE — TOC Progression Note (Addendum)
 Transition of Care [facility]) - Progression Note    Patient Details  Name: [name] MRN: [id number] Date of Birth: 21-Mar-1951  Transition of [facility]) CM/SW Contact  Luise JAYSON Pan, [place] Phone Number: 09/12/2024, 11:00 AM  Clinical Narrative:   Per HTA, patients prior auth request has been sent to the nurse practitioner for review. CSW will await call back with an update.  1:59 PM Insurance is offering a peer to peer due by tomorrow 1/23 at 10 am. MD will need to call Cherly, NP at [phone]. If MD is unable to get in contact just leave a voicemail and the NP should reach back out. Per HTA, the peer to peer is for SNF and PTAR request. CSW notified MD.  2:34 PM MD completed peer to peer and insurance has issued a denial. CSW awaiting formal call from HTA with denial/appeal information. CSW informed patient of insurance denial but that CSW is awaiting the official denial letter. Patient is interested in potentially appealing but is apprehensive due to not knowing if her hospital stay will continue to be covered. Patient asked for time to think of decision. CSW to follow up once CSW receives official denial letter from HTA.  4:46 PM CSW provided patient denial letter from insurance. CSW submitted for fast track appeal. Fast appeal will take approximately 72 hours.  CSW will continue to follow.     Expected Discharge Plan: Skilled Nursing Facility Barriers to Discharge: Continued Medical Work up, Other (must enter comment), English As A Second Language Teacher (Patient decision on SNF)               Expected Discharge Plan and Services In-house Referral: Clinical Social Work Discharge Planning Services: CM Consult Post Acute Care Choice: Home Health Living arrangements for the past 2 months: Single Family Home                                       Social Drivers of Health (SDOH) Interventions SDOH Screenings   Food Insecurity: No Food Insecurity (09/05/2024)  Housing:  Low Risk (09/05/2024)  Transportation Needs: No Transportation Needs (09/05/2024)  Utilities: Not At Risk (09/05/2024)  Social Connections: Socially Isolated (09/05/2024)  Tobacco Use: Medium Risk (09/04/2024)  Health Literacy: Adequate Health Literacy (05/31/2023)    Readmission Risk Interventions     No data to display

## 2024-09-12 NOTE — Progress Notes (Addendum)
" °  Progress Note   Patient: [name] FMW:[id number] DOB: 11/30/1950 DOA: 09/04/2024     8 DOS: the patient was seen and examined on 09/12/2024   Brief hospital course: 74 year old female with a PMH of anxiety disorder, CHF, CKD, HTN, obesity, hypothyroidism, paroxysmal A-fib (on Eliquis ), who presented to the ED for right upper quadrant abdominal pain.  Initially thought to be due to UTI, but then patient became hypoxic, and had dyspnea on exertion and lower extremity edema.  She also reported to have injury to the left thigh prior to admission.  Initial imaging included CT of abdomen/pelvis that showed ascites and right middle lobe pulmonary nodule, and she also had elevated BNP.  She was diuresed for acute on chronic HFpEF.  Hospital course complicated by anemia, with hemoglobin dropping down to 7.4 for which she received 1 unit PRBC and her Eliquis  was held.  CT of the left thigh showed 500 mL collection of blood in her limb.  Hemoglobin has now stabilized, and eliquis  resumed   01/22 pending placement.   Assessment and Plan: * Acute on chronic diastolic CHF (congestive heart failure) (HCC) Echocardiogram with preserved LV systolic function with EF 70 to 75%, mild LVH, grade I diastolic dysfunction with impaired relaxation, RV systolic function preserved, LA with mild dilatation, no significant valvular disease.   Urine output is 1,300 ml Systolic blood pressure 120 to 130 mmHg.   Plan to furosemide  40 mg po bid Metoprolol  and spironolactone    Essential hypertension Continue blood pressure control with metoprolol .   Paroxysmal atrial fibrillation (HCC) Continue rate control with metoprolol  and anticoagulation. Apixaban   Continue flecainide   Ok to discontinue telemetry   Follow up on H&H, for left thigh ecchymosis   Hypothyroidism Continue levothyroxine    Depression Continue with escitalopram   Obesity, class 3 (HCC) Calculated BMI is 62,6   Subjective: Patient with no  chest pain or dyspnea, noted worsening tightness on her left thigh   Physical Exam: Vitals:   09/12/24 0204 09/12/24 0358 09/12/24 0716 09/12/24 1144  BP:  (!) 133/55 122/61 134/66  Pulse:  (!) 56 (!) 53 60  Resp:  17 (!) 23 (!) 22  Temp:  97.8 F (36.6 C)  98.3 F (36.8 C)  TempSrc:  Oral Oral Oral  SpO2:  100% 97% 100%  Weight: (!) 156.6 kg     Height:       Neurology awake and alert, deconditioned ENT with mild pallor Cardiovascular with S1 and S2 present and regular with no gallops or rubs Respiratory with no rales or wheezing, no rhonchi Abdomen with no distention  No ankle edema, positive lymphedema.  Positive ecchymosis at her left lateral thigh     Data Reviewed:    Family Communication: no family at the bedside   Disposition: Status is: Inpatient Remains inpatient appropriate because: recovering heart failure   Planned Discharge Destination: Skilled nursing facility    Author: Elidia Toribio Furnace, MD 09/12/2024 2:49 PM  For on call review [email or web address].  "

## 2024-09-12 NOTE — Progress Notes (Signed)
 Mobility Specialist Progress Note:    09/12/24 1355  Mobility  Activity Pivoted/transferred from chair to bed  Level of Assistance Moderate assist, patient does 50-74%  Assistive Device Four wheel walker (+2)  Distance Ambulated (ft) 3 ft  Range of Motion/Exercises Active  Activity Response Tolerated fair  Mobility Referral Yes  Mobility visit 1 Mobility  Mobility Specialist Start Time (ACUTE ONLY) 1355  Mobility Specialist Stop Time (ACUTE ONLY) 1416  Mobility Specialist Time Calculation (min) (ACUTE ONLY) 21 min   Received pt in recliner and asked to assist transferring back to bed by RN for cleaning. No c/o any symptoms. Pt once again did well requiring ModA to stand, stood for several mins in order to get cleaned, and took steps using rollator +2 to transfer EOB. Pt able to assist w/ laying down. Pt fatigues quickly. Returned pt to bed w/ RN in room.   Venetia Keel Mobility Specialist Please Neurosurgeon or Rehab Office at [phone]

## 2024-09-12 NOTE — Plan of Care (Signed)
" °  Problem: Education: Goal: Knowledge of General Education information will improve Description: Including pain rating scale, medication(s)/side effects and non-pharmacologic comfort measures Outcome: Progressing   Problem: Health Behavior/Discharge Planning: Goal: Ability to manage health-related needs will improve Outcome: Progressing   Problem: Clinical Measurements: Goal: Respiratory complications will improve Outcome: Progressing   Problem: Cardiac: Goal: Ability to achieve and maintain adequate cardiopulmonary perfusion will improve Outcome: Progressing   "

## 2024-09-13 ENCOUNTER — Other Ambulatory Visit (HOSPITAL_COMMUNITY): Payer: Self-pay

## 2024-09-13 DIAGNOSIS — I5033 Acute on chronic diastolic (congestive) heart failure: Secondary | ICD-10-CM | POA: Diagnosis not present

## 2024-09-13 DIAGNOSIS — E039 Hypothyroidism, unspecified: Secondary | ICD-10-CM | POA: Diagnosis not present

## 2024-09-13 DIAGNOSIS — I10 Essential (primary) hypertension: Secondary | ICD-10-CM | POA: Diagnosis not present

## 2024-09-13 DIAGNOSIS — I480 Paroxysmal atrial fibrillation: Secondary | ICD-10-CM | POA: Diagnosis not present

## 2024-09-13 DIAGNOSIS — N179 Acute kidney failure, unspecified: Secondary | ICD-10-CM

## 2024-09-13 DIAGNOSIS — S7012XA Contusion of left thigh, initial encounter: Secondary | ICD-10-CM

## 2024-09-13 MED ORDER — FUROSEMIDE 20 MG PO TABS
40.0000 mg | ORAL_TABLET | Freq: Two times a day (BID) | ORAL | 0 refills | Status: AC
  Filled 2024-09-13: qty 120, 30d supply, fill #0

## 2024-09-13 MED ORDER — SPIRONOLACTONE 25 MG PO TABS
12.5000 mg | ORAL_TABLET | Freq: Every day | ORAL | 0 refills | Status: AC
  Filled 2024-09-13: qty 15, 30d supply, fill #0

## 2024-09-13 NOTE — TOC Progression Note (Signed)
 Transition of Care [facility]) - Progression Note    Patient Details  Name: [name] MRN: [id number] Date of Birth: 06-06-1951  [facility]) CM/SW Contact  Waddell Barnie Rama, RN Phone Number: 09/13/2024, 11:26 AM  Clinical Narrative:    Patient has decided to go home and not appeal the SNF denial.  She was set up with [facility] previously, this NCM will let Hedda know of the dc today.    Expected Discharge Plan: Skilled Nursing Facility Barriers to Discharge: Continued Medical Work up, Other (must enter comment), English As A Second Language Teacher (Patient decision on SNF)               Expected Discharge Plan and Services In-house Referral: Clinical Social Work Discharge Planning Services: CM Consult Post Acute Care Choice: Home Health Living arrangements for the past 2 months: Single Family Home Expected Discharge Date: 09/13/24                                     Social Drivers of Health (SDOH) Interventions SDOH Screenings   Food Insecurity: No Food Insecurity (09/05/2024)  Housing: Low Risk (09/05/2024)  Transportation Needs: No Transportation Needs (09/05/2024)  Utilities: Not At Risk (09/05/2024)  Social Connections: Socially Isolated (09/05/2024)  Tobacco Use: Medium Risk (09/04/2024)  Health Literacy: Adequate Health Literacy (05/31/2023)    Readmission Risk Interventions     No data to display

## 2024-09-13 NOTE — TOC Progression Note (Addendum)
 Transition of Care [facility]) - Progression Note    Patient Details  Name: [name] MRN: [id number] Date of Birth: 04/22/1951  Transition of Care [facility]) CM/SW Contact  Luise JAYSON Pan, [place] Phone Number: 09/13/2024, 8:24 AM  Clinical Narrative:   CSW received failure of fax notice for appeal. CSW resubmitted the fast track appeal at this time. Appeal decision will take approximately 72 hours.   8:47 AM CSW received successful fax notice.  9:09 AM HTA called CSW to inform that they need CSW to resubmit appeal with the letter stating that CSW is requesting the appeal on the providers behalf.  10:20 AM Per bedside RN, patient stated she does not want to try for the appeal with insurance anymore. Patient wants to go home with home health at this time. CSW notified RNCM and MD. CSW did not resubmit appeal per patients request.  CSW will continue to follow.      Expected Discharge Plan: Skilled Nursing Facility Barriers to Discharge: Continued Medical Work up, Other (must enter comment), English As A Second Language Teacher (Patient decision on SNF)               Expected Discharge Plan and Services In-house Referral: Clinical Social Work Discharge Planning Services: CM Consult Post Acute Care Choice: Home Health Living arrangements for the past 2 months: Single Family Home                                       Social Drivers of Health (SDOH) Interventions SDOH Screenings   Food Insecurity: No Food Insecurity (09/05/2024)  Housing: Low Risk (09/05/2024)  Transportation Needs: No Transportation Needs (09/05/2024)  Utilities: Not At Risk (09/05/2024)  Social Connections: Socially Isolated (09/05/2024)  Tobacco Use: Medium Risk (09/04/2024)  Health Literacy: Adequate Health Literacy (05/31/2023)    Readmission Risk Interventions     No data to display

## 2024-09-13 NOTE — Progress Notes (Signed)
 Occupational Therapy Treatment Patient Details Name: [name] MRN: [id number] DOB: 11-26-1950 Today's Date: 09/13/2024   History of present illness 74 y.o. female presents to [facility] 09/04/24 with R upper quadrant pain, generalized weakness/fatigue, and SOB. CT scan showed ascites w/ sigmoid diverticulosis and R middle lobe pulmonary nodule. Also with acute hypoxic respiratory failure 2/2 acute on chronic CHF exacerbation, +UTI. PMHx: anxiety disorder, congestive heart failure, CKD, hypertension, class III obesity, hypothyroidism, paroxysmal atrial fibrillation and splenic infarct   OT comments  Patient up on [facility] upon entry with PT. Patient able to stand from [facility] with mod assist +2 and required max assist for toilet hygiene back and patient able to perform hygiene front while standing. Patient was able to stand ~8 minutes for toilet hygiene and was able to perform step pivot transfer to reclienr. Patient asking for O2 once in recliner.  Patient will benefit from continued inpatient follow up therapy, <3 hours/day but patient is declining for home with HHOT to follow. Acute OT to continue to follow.       If plan is discharge home, recommend the following:  Two people to help with walking and/or transfers;A lot of help with bathing/dressing/bathroom;Assistance with cooking/housework;Assist for transportation;Help with stairs or ramp for entrance   Equipment Recommendations  None recommended by OT    Recommendations for Other Services      Precautions / Restrictions Precautions Precautions: Fall Recall of Precautions/Restrictions: Intact Restrictions Weight Bearing Restrictions Per Provider Order: No       Mobility Bed Mobility Overal bed mobility: Needs Assistance             General bed mobility comments: OOB on BSC with PT and left in recliner at end of session    Transfers Overall transfer level: Needs assistance Equipment used: Rollator (4 wheels) Transfers: Sit  to/from Stand, Bed to chair/wheelchair/BSC Sit to Stand: Mod assist, +2 physical assistance, +2 safety/equipment     Step pivot transfers: Min assist, +2 physical assistance     General transfer comment: Mod assist to stand from [facility] and min assist for transfer once up with rollator     Balance Overall balance assessment: Needs assistance Sitting-balance support: No upper extremity supported, Feet supported Sitting balance-Leahy Scale: Good     Standing balance support: Bilateral upper extremity supported, During functional activity, Reliant on assistive device for balance Standing balance-Leahy Scale: Poor Standing balance comment: reliant on external support, able to perform peri area front cleaning with one extremity support                           ADL either performed or assessed with clinical judgement   ADL Overall ADL's : Needs assistance/impaired     Grooming: Wash/dry hands;Wash/dry face;Set up;Supervision/safety;Sitting                   Toilet Transfer: Moderate assistance;+2 for physical assistance;BSC/3in1 Toilet Transfer Details (indicate cue type and reason): patient on [facility] upon entry with PT and assisted off toilet Toileting- Clothing Manipulation and Hygiene: Maximal assistance;Sit to/from stand Toileting - Clothing Manipulation Details (indicate cue type and reason): patient able to stand for toilet hygiene back with max assist and able to perform peri area cleaning front while standing            Extremity/Trunk Assessment              Vision       Perception  Praxis     Communication Communication Communication: No apparent difficulties   Cognition Arousal: Alert Behavior During Therapy: WFL for tasks assessed/performed Cognition: No apparent impairments                               Following commands: Impaired Following commands impaired: Follows multi-step commands with increased time      Cueing    Cueing Techniques: Verbal cues, Gestural cues, Tactile cues  Exercises      Shoulder Instructions       General Comments VSS on RA    Pertinent Vitals/ Pain       Pain Assessment Pain Assessment: Faces Faces Pain Scale: Hurts a little bit Pain Location: L lateral thigh Pain Descriptors / Indicators: Grimacing, Guarding, Discomfort, Sore Pain Intervention(s): Limited activity within patient's tolerance, Monitored during session, Repositioned  Home Living                                          Prior Functioning/Environment              Frequency  Min 2X/week        Progress Toward Goals  OT Goals(current goals can now be found in the care plan section)  Progress towards OT goals: Progressing toward goals  Acute Rehab OT Goals Patient Stated Goal: to be able to walk more OT Goal Formulation: With patient Time For Goal Achievement: 09/19/24 Potential to Achieve Goals: Fair  Plan      Co-evaluation                 AM-PAC OT 6 Clicks Daily Activity     Outcome Measure   Help from another person eating meals?: None Help from another person taking care of personal grooming?: A Little Help from another person toileting, which includes using toliet, bedpan, or urinal?: A Lot Help from another person bathing (including washing, rinsing, drying)?: A Lot Help from another person to put on and taking off regular upper body clothing?: A Little Help from another person to put on and taking off regular lower body clothing?: Total 6 Click Score: 15    End of Session Equipment Utilized During Treatment: Gait belt;Rolling walker (2 wheels);Oxygen (left on O2 at end of sssion)  OT Visit Diagnosis: Muscle weakness (generalized) (M62.81);Pain Pain - Right/Left: Left Pain - part of body: Leg   Activity Tolerance Patient tolerated treatment well   Patient Left in chair;with call bell/phone within reach   Nurse Communication Mobility  status        Time: 9062-8995 OT Time Calculation (min): 27 min  Charges: OT General Charges $OT Visit: 1 Visit OT Treatments $Self Care/Home Management : 8-22 mins  Dick [name], OTA Acute Rehabilitation Services  Office [phone]   [name] 09/13/2024, 2:12 PM

## 2024-09-13 NOTE — Progress Notes (Signed)
 Nurse requested Mobility Specialist to perform oxygen saturation test with pt which includes removing pt from oxygen both at rest and while ambulating.  Below are the results from that testing.     Patient Saturations on Room Air at Rest = spO2 96%  Patient Saturations on Room Air while Ambulating = sp02 95% .    Patient Saturations on 0 Liters of oxygen while Ambulating = sp02 95%  At end of testing pt left in room on 0  Liters of oxygen.  Reported results to nurse.    RN in room during session. RN noted VS as we were ambulating w/ chair follow. Pt O2 sats on room air stayed between 95%-97%.

## 2024-09-13 NOTE — TOC Transition Note (Addendum)
 Transition of Care [facility]) - Discharge Note   Patient Details  Name: [name] MRN: [id number] Date of Birth: [birth year]  Transition of [facility]) CM/SW Contact:  Waddell Barnie Rama, RN Phone Number: 09/13/2024, 12:34 PM   Clinical Narrative:    For dc today, she does not need home oxygen, she will need bariatric BSC ,  she has no preference of the agency.  NCM made referral to [employer].  They will supply this to patient's home.  Patient will be transported by [facility], which has been scheduled , eta is 2 hrs.  Patient has now stated she wants a bariatric w/chair, MD has ordered,  NCM notified [employer] to deliver the bariatric w/chair when they deliver the bsc.     Barriers to Discharge: Continued Medical Work up, Other (must enter comment), English As A Second Language Teacher (Patient decision on SNF)   Patient Goals and CMS Choice Patient states their goals for this hospitalization and ongoing recovery are:: To return home CMS Medicare.gov Compare Post Acute Care list provided to::  (NA) Choice offered to / list presented to : NA [place] ownership interest in [facility].provided to::  (NA)    Discharge Placement                       Discharge Plan and Services Additional resources added to the After Visit Summary for   In-house Referral: Clinical Social Work Discharge Planning Services: CM Consult Post Acute Care Choice: Home Health                               Social Drivers of Health (SDOH) Interventions SDOH Screenings   Food Insecurity: No Food Insecurity (09/05/2024)  Housing: Low Risk (09/05/2024)  Transportation Needs: No Transportation Needs (09/05/2024)  Utilities: Not At Risk (09/05/2024)  Social Connections: Socially Isolated (09/05/2024)  Tobacco Use: Medium Risk (09/04/2024)  Health Literacy: Adequate Health Literacy (05/31/2023)     Readmission Risk Interventions     No data to display

## 2024-09-13 NOTE — Progress Notes (Signed)
 Heart Failure Navigator Progress Note  Assessed for Heart & Vascular TOC clinic readiness.   Patient does not meet criteria per Dr. Noralee - will not schedule HF TOC at this time.  Navigator available for reassessment of patient.   Duwaine Plant, PharmD, BCPS Heart Failure Stewardship Pharmacist Phone [phone]

## 2024-09-13 NOTE — Plan of Care (Signed)
" °  Problem: Education: Goal: Knowledge of General Education information will improve Description: Including pain rating scale, medication(s)/side effects and non-pharmacologic comfort measures Outcome: Progressing   Problem: Health Behavior/Discharge Planning: Goal: Ability to manage health-related needs will improve Outcome: Progressing   Problem: Clinical Measurements: Goal: Ability to maintain clinical measurements within normal limits will improve Outcome: Progressing Goal: Cardiovascular complication will be avoided Outcome: Progressing   Problem: Cardiac: Goal: Ability to achieve and maintain adequate cardiopulmonary perfusion will improve Outcome: Progressing   "

## 2024-09-13 NOTE — Care Management Important Message (Signed)
 Important Message  Patient Details  Name: [name] MRN: [id number] Date of Birth: 1951-03-02   Important Message Given:  Yes - Medicare IM     Vonzell Arrie Sharps 09/13/2024, 12:09 PM

## 2024-09-13 NOTE — Discharge Summary (Signed)
 " Physician Discharge Summary   Patient: [name] MRN: [id number] DOB: July 29, 1951  Admit date:     09/04/2024  Discharge date: 09/13/24  Discharge Physician: [name]   PCP: Christine Nottingham, MD   Recommendations at discharge:    Patient will resume furosemide  40 mg po bid, and added spironolactone . Continue losartan  and metoprolol . Holding SLGT 2 inh due to body habitus and risk of urine and perineal infections.  Follow up renal function and electrolytes as outpatient in 7 days  Follow up Hgb and Hct in 7 days as outpatient  Follow up with [name] in 7 to 10 days   I spoke with patient's son over the phone at the bedside, we talked in detail about patient's condition, plan of care and prognosis and all questions were addressed.   Discharge Diagnoses: Principal Problem:   Acute on chronic diastolic CHF (congestive heart failure) (HCC) Active Problems:   Essential hypertension   Paroxysmal atrial fibrillation (HCC)   Hypothyroidism   Depression   Obesity, class 3 (HCC)  Resolved Problems:   * No resolved hospital problems. [facility] Main Course: [name] was admitted to the hospital with the working diagnosis of acute on chronic heart failure exacerbation.   74 year old female with past medical history of anxiety disorder, heart failure, CKD, HTN, obesity, hypothyroidism, paroxysmal A-fib who presented to the ED for right upper quadrant abdominal pain.  Patient reported 2 days of intermittent abdominal pain, associated with generalized weakness, lower extremity edema, orthopnea and dyspnea on exertion.  She also reported to have injury to the left thigh prior to admission. Because persistent and severe pain she called EMS, then she was transported to the ED.  On her initial physical examination her blood pressure was 122/72, HR 50, RR 20 and 02 saturation 99%  Lungs with decreased sounds bilaterally, with no rales or wheezing, heart with S1 and S2 present and  regular, abdomen with no distention and positive lower extremity edema.  Left thigh with positive ecchymosis.   Na 138, K 3.9 Cl 95 bicarbonate 31 glucose 100 bun 25 cr 1,0 Wbc 10.1 hgb 15.5 plt 207  Urine analysis SG 1,027, protein 100, trace leukocytes and moderate hgb, wbc 6-10 ad RBC 11-20   Chest radiograph with hypoinflation and right rotation, with bilateral vascular congestion, no effusions or infiltrates.   EKG 66 bpm, left axis deviation, right bundle branch block, qtc 516, sinus rhythm with poor RR wave progression, with no significant ST segment or T wave changes.   CT abdomen and pelvis interval development of mild ascites, abnormal but nonspecific.  Severe sigmoid diverticulosis with no acute inflammatory changes.  Stable 5 mm subpleural right middle lobe pulmonary nodule, no routine follow up is recommended.  Mild hepatic steatosis   Patient was placed on IV furosemide  for diuresis   Hospital course complicated by anemia, with hemoglobin dropping down to 7.4 for which she received 1 unit PRBC and her Eliquis  was held.  CT of the left thigh showed 500 mL collection of blood in her limb.  Hemoglobin has now stabilized, and eliquis  resumed   01/22 pending placement. Insurance declined SNF coverage.  01/23 patient has decided to go home with home health services.   Assessment and Plan: * Acute on chronic diastolic CHF (congestive heart failure) (HCC) Echocardiogram with preserved LV systolic function with EF 70 to 75%, mild LVH, grade I diastolic dysfunction with impaired relaxation, RV systolic function preserved, LA with mild dilatation, no significant  valvular disease.   Patient was placed on IV furosemide  for diuresis, negative fluid balance was achieved, -4,149 ml, with significant improvement in her symptoms    Plan to continue furosemide  40 mg po bid Metoprolol , losartan  and spironolactone   Follow up as outpatient   Essential hypertension Continue blood pressure  control with metoprolol  and losartan   Paroxysmal atrial fibrillation (HCC) Continue rate control with metoprolol  and anticoagulation. Apixaban   Continue flecainide    Hypothyroidism Continue levothyroxine    AKI (acute kidney injury) Patient was placed on furosemide  with good toleration, at the time of her discharge her renal function has improved with a serum cr at 1,0 with K at 4,4 and serum bicarbonate at 41 Na 139   Plan to continue furosemide  and spironolactone  Follow up renal function and electrolytes as outpatient   Hematoma of left thigh 01/15 CT with 500 cc anterolateral left thigh subcutaneous hematoma with surrounding inflammatory stranding  No intramuscular hematoma identified Total left knee arthroplasty   Patient had acute anemia that required PRBC transfusion.  Once hematoma stabilized, anticoagulation was resumed with good toleration  Her discharge hgb is 9.9   Depression Continue with escitalopram   Obesity, class 3 (HCC) Calculated BMI is 62,6       Consultants: none  Procedures performed: none   Disposition: Home Diet recommendation:  Cardiac and Carb modified diet DISCHARGE MEDICATION: Allergies as of 09/13/2024       Reactions   Augmentin [amoxicillin-pot Clavulanate] Hives, Itching   Pneumococcal Vaccines Other (See Comments)   Caused fever, and swelling at injection site        Medication List     STOP taking these medications    cephALEXin  500 MG capsule Commonly known as: KEFLEX        TAKE these medications    acetaminophen  500 MG tablet Commonly known as: TYLENOL  Take 500-1,000 mg by mouth daily as needed for mild pain (pain score 1-3), headache or moderate pain (pain score 4-6).   clobetasol cream 0.05 % Commonly known as: TEMOVATE Apply topically as needed (rash).   Eliquis  5 MG Tabs tablet Generic drug: apixaban  TAKE 1 TABLET BY MOUTH 2 TIMES DAILY.   escitalopram  5 MG tablet Commonly known as: LEXAPRO  Take 5 mg by  mouth daily.   flecainide  100 MG tablet Commonly known as: TAMBOCOR  Take 1 tablet (100 mg total) by mouth 2 (two) times daily.   furosemide  20 MG tablet Commonly known as: LASIX  Take 2 tablets (40 mg total) by mouth 2 (two) times daily.   levothyroxine  88 MCG tablet Commonly known as: SYNTHROID  Take 88 mcg by mouth every morning.   losartan  50 MG tablet Commonly known as: COZAAR  Take 50 mg by mouth daily.   metoprolol  tartrate 50 MG tablet Commonly known as: LOPRESSOR  Take 1 tablet (50 mg total) by mouth 2 (two) times daily.   omeprazole  20 MG capsule Commonly known as: PRILOSEC Take 1 capsule (20 mg total) by mouth 2 (two) times daily before a meal. What changed: when to take this   spironolactone  25 MG tablet Commonly known as: ALDACTONE  Take 0.5 tablets (12.5 mg total) by mouth daily. Start taking on: September 14, 2024        Contact information for after-discharge care     Destination     [facility]   Service: Skilled Nursing Contact information: [address] [place]  [phone]             Home Medical Care  [facility] [place] [facility]) .   Service: Home Health Services Why: Agency will call you to set up apt times Contact information: [address] [facility] [place]  [phone]                    Discharge Exam: Fredricka Weights   09/10/24 0500 09/11/24 0500 09/12/24 0204  Weight: (!) 178 kg (!) 181.5 kg (!) 156.6 kg   BP (!) 130/59 (BP Location: Right Wrist)   Pulse 62   Temp 98.3 F (36.8 C) (Oral)   Resp (!) 22   Ht 5' 7 (1.702 m)   Wt (!) 156.6 kg   SpO2 98%   BMI 54.07 kg/m   Patient is feeling better, dyspnea and edema have improved. 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   Imaging Studies: ECHOCARDIOGRAM COMPLETE Result Date: 09/05/2024    ECHOCARDIOGRAM REPORT   Patient Name:   Alysse J Boehne Date of Exam: 09/05/2024 Medical Rec #:  [id number]          Height:       67.0 in Accession #:    [id number]         Weight:       374.8 lb Date of Birth:  02-Jun-1951           BSA:          2.641 m Patient Age:    73 years           BP:           113/49 mmHg Patient Gender: F                  HR:           64 bpm. Exam Location:  Inpatient Procedure: 2D Echo, Cardiac Doppler, Color Doppler and Intracardiac            Opacification Agent (Both Spectral and Color Flow Doppler were            utilized during procedure). Indications:    CHF Acute Distolic I50.31  History:        Patient has prior history of Echocardiogram examinations, most                 recent 06/28/2022. CHF; Risk Factors:Hypertension.  Sonographer:    Tinnie Gosling RDCS Referring Phys: [phone] [facility] POKHREL IMPRESSIONS  1. Left ventricular ejection fraction, by estimation, is 70 to 75%. The left ventricle has hyperdynamic function. The left ventricle has no regional wall motion abnormalities. There is mild left ventricular hypertrophy. Left ventricular diastolic parameters are consistent with Grade I diastolic dysfunction (impaired relaxation).  2. Right ventricular systolic function is normal. The right ventricular size is normal.  3. Left atrial size was mildly dilated.  4. The mitral valve is normal in structure. No evidence of mitral valve regurgitation. No evidence of mitral stenosis.  5. The aortic valve is tricuspid.  Aortic valve regurgitation is not  visualized. No aortic stenosis is present. FINDINGS  Left Ventricle: Left ventricular ejection fraction, by estimation, is 70 to 75%. The left ventricle has hyperdynamic function. The left ventricle has no regional wall motion abnormalities. Definity  contrast agent was given IV to delineate the left ventricular endocardial borders. The left ventricular internal cavity size was normal in size. There is mild left ventricular hypertrophy. Left ventricular diastolic parameters are consistent with Grade I diastolic dysfunction (impaired relaxation). Right Ventricle: The right ventricular size is normal. Right ventricular systolic function is normal. Left Atrium: Left atrial size was mildly dilated. Right Atrium: Right atrial size was normal in size. Pericardium: There is no evidence of pericardial effusion. Mitral Valve: The mitral valve is normal in structure. Mild mitral annular calcification. No evidence of mitral valve regurgitation. No evidence of mitral valve stenosis. Tricuspid Valve: The tricuspid valve is not well visualized. Tricuspid valve regurgitation is not demonstrated. No evidence of tricuspid stenosis. Aortic Valve: The aortic valve is tricuspid. Aortic valve regurgitation is not visualized. No aortic stenosis is present. Pulmonic Valve: The pulmonic valve was not well visualized. Pulmonic valve regurgitation is not visualized. No evidence of pulmonic stenosis. Aorta: The aortic root is normal in size and structure. Venous: The inferior vena cava was not well visualized. IAS/Shunts: The interatrial septum was not well visualized.  LEFT VENTRICLE PLAX 2D LVIDd:         4.30 cm   Diastology LVIDs:         3.00 cm   LV e' medial:    5.22 cm/s LV PW:         1.20 cm   LV E/e' medial:  16.3 LV IVS:        1.10 cm   LV e' lateral:   6.09 cm/s LVOT diam:     1.81 cm   LV E/e' lateral: 13.9 LV SV:         64 LV SV Index:   24 LVOT Area:     2.57 cm  IVC IVC diam: 1.93 cm LEFT  ATRIUM             Index LA diam:        4.94 cm 1.87 cm/m LA Vol (A2C):   97.5 ml 36.92 ml/m LA Vol (A4C):   83.4 ml 31.58 ml/m LA Biplane Vol: 90.5 ml 34.27 ml/m  AORTIC VALVE LVOT Vmax:   120.00 cm/s LVOT Vmean:  84.900 cm/s LVOT VTI:    0.250 m  AORTA Ao Root diam: 2.38 cm Ao Asc diam:  3.28 cm MITRAL VALVE MV Area (PHT): 2.63 cm    SHUNTS MV Decel Time: 288 msec    Systemic VTI:  0.25 m MV E velocity: 84.90 cm/s  Systemic Diam: 1.81 cm MV A velocity: 82.80 cm/s MV E/A ratio:  1.03 Redell Shallow MD Electronically signed by Redell Shallow MD Signature Date/Time: 09/05/2024/2:50:45 PM    Final    CT FEMUR LEFT WO CONTRAST Result Date: 09/05/2024 EXAM: CT LEFT FEMUR, WITHOUT IV CONTRAST 09/05/2024 11:23:33 AM TECHNIQUE: Axial images were acquired through the left femur without IV contrast. Reformatted images were reviewed. Automated exposure control, iterative reconstruction, and/or weight based adjustment of the mA/kV was utilized to reduce the radiation dose to as low as reasonably achievable. COMPARISON: None available. CLINICAL HISTORY: Upper leg trauma; Concern for intramuscular bleeding. * Tracking Code: BO * habitus. FINDINGS: BONES AND JOINTS: Total knee prosthesis noted. No acute fracture or focal osseous lesion. No dislocation. The joint spaces are  normal. SOFT TISSUES: Anterolateral subcutaneous hematoma in the left thigh measuring approximately 16.3 x 11.4 x 5.2 cm with surrounding inflammatory stranding. The total volume is approximately 500 ml. No definite intramuscular hematoma identified. Contrast medium noted in the urinary bladder. IMPRESSION: 1. 500 cc anterolateral left thigh subcutaneous hematoma with surrounding inflammatory stranding. 2. No intramuscular hematoma identified. 3. Total knee arthroplasty. 4. Body habitus reduces diagnostic sensitivity and specificity. Electronically signed by: Ryan Salvage MD 09/05/2024 11:59 AM EST RP Workstation: [id number]   DG Chest Port 1  View Result Date: 09/04/2024 EXAM: 1 VIEW(S) XRAY OF THE CHEST 09/04/2024 05:47:00 AM COMPARISON: 06/27/2024 CLINICAL HISTORY: The patient has a new oxygen requirement. FINDINGS: LINES, TUBES AND DEVICES: Cardiac loop recorder noted. LUNGS AND PLEURA: Mildly hypoinflated lungs. Mild diffuse pulmonary interstitial prominence, mildly increased from comparison. No focal pulmonary opacity. No pleural effusion. No pneumothorax. HEART AND MEDIASTINUM: Mild cardiomegaly. Aortic arch atherosclerosis. BONES AND SOFT TISSUES: No acute osseous abnormality. IMPRESSION: 1. Mild diffuse pulmonary interstitial prominence, mildly increased from comparison. 2. Mildly hypoinflated lungs. 3. Mild cardiomegaly and aortic arch atherosclerosis. Cardiac loop recorder noted. Electronically signed by: Evalene Coho MD 09/04/2024 06:08 AM EST RP Workstation: [id number]   CT ABDOMEN PELVIS W CONTRAST Result Date: 09/04/2024 EXAM: CT ABDOMEN AND PELVIS WITH CONTRAST 09/04/2024 01:19:31 AM TECHNIQUE: CT of the abdomen and pelvis was performed with the administration of intravenous contrast. Multiplanar reformatted images are provided for review. Automated exposure control, iterative reconstruction, and/or weight-based adjustment of the mA/kV was utilized to reduce the radiation dose to as low as reasonably achievable. COMPARISON: 01/21/2024 CLINICAL HISTORY: Abdominal pain, acute, nonlocalized. FINDINGS: LOWER CHEST: Small hiatal hernia. Stable 5 mm subpleural pulmonary nodule within the visualized right middle lobe (series 3, image 5). New follow up imaging recommended. LIVER: Mild hepatic steatosis. GALLBLADDER AND BILE DUCTS: Status post cholecystectomy. No biliary ductal dilatation. SPLEEN: No acute abnormality. PANCREAS: No acute abnormality. ADRENAL GLANDS: No acute abnormality. KIDNEYS, URETERS AND BLADDER: No stones in the kidneys or ureters. No hydronephrosis. No perinephric or periureteral stranding. Urinary bladder is  unremarkable. GI AND BOWEL: Severe sigmoid diverticulosis without superimposed acute inflammatory change. Scattered diverticular are seen throughout the remainder of the colon. The stomach, small bowel, and large bowel are otherwise unremarkable. Appendix normal. PERITONEUM AND RETROPERITONEUM: Interval development of mild ascites. No free air. VASCULATURE: Mild aortoiliac atherosclerotic calcification. No aortic aneurysm. Aorta is normal in caliber. LYMPH NODES: No lymphadenopathy. REPRODUCTIVE ORGANS: No acute abnormality. BONES AND SOFT TISSUES: Severe bilateral degenerative hip arthritis. Advanced degenerative changes are seen within the visualized thoracolumbar spine. No acute bone abnormality. No lytic or blastic bone lesion. No focal soft tissue abnormality. IMPRESSION: 1. Interval development of mild ascites, abnormal but nonspecific . 2. Severe sigmoid diverticulosis without acute inflammatory change, with scattered diverticula elsewhere in the colon. 3. Stable 5 mm subpleural right middle lobe pulmonary nodule; no routine follow-up imaging is recommended per Fleischner Society Guidelines. 4. Small hiatal hernia 5. mild hepatic steatosis 6. RAF score includes aortic atherosclerosis (ICD10-I70.0). Electronically signed by: Dorethia Molt MD 09/04/2024 01:28 AM EST RP Workstation: [id number]    Microbiology: Results for orders placed or performed during the hospital encounter of 09/04/24  Urine Culture (for pregnant, neutropenic or urologic patients or patients with an indwelling urinary catheter)     Status: Abnormal   Collection Time: 09/04/24  1:56 PM   Specimen: Urine, Clean Catch  Result Value Ref Range Status   Specimen Description URINE, CLEAN CATCH  Final   Special  Requests   Final    NONE Performed at [facility], [address] [place], [place] [address]    Culture MULTIPLE SPECIES PRESENT, SUGGEST RECOLLECTION (A)  Final   Report Status 09/05/2024 FINAL  Final  MRSA Next Gen  by PCR, Nasal     Status: None   Collection Time: 09/09/24  3:11 AM   Specimen: Nasal Mucosa; Nasal Swab  Result Value Ref Range Status   MRSA by PCR Next Gen NOT DETECTED NOT DETECTED Final    Comment: (NOTE) The GeneXpert MRSA Assay (FDA approved for NASAL specimens only), is one component of a comprehensive MRSA colonization surveillance program. It is not intended to diagnose MRSA infection nor to guide or monitor treatment for MRSA infections. Test performance is not FDA approved in patients less than 70 years old. Performed at [facility], [address] [place], [place] [address]     Labs: CBC: Recent Labs  Lab 09/07/24 0225 09/07/24 1228 09/08/24 0305 09/09/24 0241 09/10/24 0921 09/12/24 1551  WBC 9.2  --  8.1 10.3 9.5  --   NEUTROABS 6.2  --  5.3 7.3  --   --   HGB 7.9* 8.5* 8.4* 8.3* 8.6* 9.9*  HCT 24.4* 26.3* 26.2* 26.4* 27.1* 31.9*  MCV 90.7  --  90.3 92.0 92.2  --   PLT 202  --  206 239 260  --    Basic Metabolic Panel: Recent Labs  Lab 09/07/24 0225 09/08/24 0305 09/09/24 0241 09/10/24 0921 09/12/24 0235  NA 134* 140 136 138 139  K 4.5 4.9 5.1 4.9 4.4  CL 95* 100 96* 95* 94*  CO2 30 34* 34* 37* 41*  GLUCOSE 100* 110* 104* 121* 106*  BUN 53* 48* 37* 30* 36*  CREATININE 2.57* 1.41* 1.03* 0.99 1.08*  CALCIUM  7.9* 8.3* 8.6* 8.9 9.9   Liver Function Tests: Recent Labs  Lab 09/09/24 0241  AST 21  ALT 10  ALKPHOS 63  BILITOT 0.7  PROT 6.1*  ALBUMIN 3.0*   CBG: No results for input(s): GLUCAP in the last 168 hours.  Discharge time spent: greater than 30 minutes.  Signed: Elidia Toribio Furnace, MD [facility] 09/13/2024 "

## 2024-09-13 NOTE — Assessment & Plan Note (Signed)
 01/15 CT with 500 cc anterolateral left thigh subcutaneous hematoma with surrounding inflammatory stranding  No intramuscular hematoma identified Total left knee arthroplasty   Patient had acute anemia that required PRBC transfusion.  Once hematoma stabilized, anticoagulation was resumed with good toleration  Her discharge hgb is 9.9

## 2024-09-13 NOTE — Progress Notes (Signed)
 Physical Therapy Treatment Patient Details Name: [name] MRN: [id number] DOB: 10-25-1950 Today's Date: 09/13/2024   History of Present Illness 74 y.o. female presents to [facility] 09/04/24 with R upper quadrant pain, generalized weakness/fatigue, and SOB. CT scan showed ascites w/ sigmoid diverticulosis and R middle lobe pulmonary nodule. Also with acute hypoxic respiratory failure 2/2 acute on chronic CHF exacerbation, +UTI. PMHx: anxiety disorder, congestive heart failure, CKD, hypertension, class III obesity, hypothyroidism, paroxysmal atrial fibrillation and splenic infarct   PT Comments  Pt received in supine and agreeable to PT session. Able to perform bed mobility with MinA and stand from EOB with ModA and use of rollator. Pt then performed a step-pivot to the [facility] with CGA. Increased time and effort to take steps with pt occasionally resting forearms on rollator handles. Required TotalA for posterior pericare in standing with pt able to stand for ~8 minutes. Able to then step-pivot to the recliner. Pt was fatigued and unable to complete further gait distance. Continue to recommend <3hrs post acute rehab. If pt declines and decides to go home, she will need 24/7 assist and HHPT. Acute PT to continue to follow.    92% SpO2 on RA   If plan is discharge home, recommend the following: Two people to help with walking and/or transfers;Two people to help with bathing/dressing/bathroom;Assistance with cooking/housework;Assist for transportation;Help with stairs or ramp for entrance   Can travel by private vehicle     No  Equipment Recommendations  Hospital bed;Hoyer lift;Wheelchair cushion (measurements PT);Wheelchair (measurements PT);BSC/3in1 (bariatric)       Precautions / Restrictions Precautions Precautions: Fall Recall of Precautions/Restrictions: Intact Restrictions Weight Bearing Restrictions Per Provider Order: No     Mobility  Bed Mobility Overal bed mobility: Needs  Assistance Bed Mobility: Supine to Sit    Supine to sit: Min assist, HOB elevated    General bed mobility comments: increased time to bring BLE's off EOB with slight assist to shifts hips forward. Use of mometum to raise trunk    Transfers Overall transfer level: Needs assistance Equipment used: Rollator (4 wheels) Transfers: Sit to/from Stand, Bed to chair/wheelchair/BSC Sit to Stand: Mod assist, +2 physical assistance, +2 safety/equipment    General transfer comment: ModA to stand from EOB with assist to boost-up. ModAx2 for physical assist to stand from [facility]    Ambulation/Gait Ambulation/Gait assistance: Contact guard assist Gait Distance (Feet): 4 Feet (x2) Assistive device: Rollator (4 wheels) Gait Pattern/deviations: Step-to pattern, Decreased step length - left, Decreased stance time - left, Decreased stride length, Knee flexed in stance - right, Knee flexed in stance - left, Antalgic Gait velocity: reduced    General Gait Details: wide BOS with ability to take steps towards the [facility] and then towards the recliner. Forward flexed posture with pt occasionally resting BUE on rollator handles due to fatigue     Balance Overall balance assessment: Needs assistance Sitting-balance support: No upper extremity supported, Feet supported Sitting balance-Leahy Scale: Good     Standing balance support: Bilateral upper extremity supported, During functional activity, Reliant on assistive device for balance Standing balance-Leahy Scale: Poor Standing balance comment: reliant on external support for stability     Communication Communication Communication: No apparent difficulties  Cognition Arousal: Alert Behavior During Therapy: WFL for tasks assessed/performed   PT - Cognitive impairments: Safety/Judgement, Problem solving, Sequencing, Initiation    Following commands: Impaired Following commands impaired: Follows multi-step commands with increased time    Cueing Cueing  Techniques: Verbal cues, Gestural cues, Tactile cues  Pertinent Vitals/Pain Pain Assessment Pain Assessment: Faces Faces Pain Scale: Hurts a little bit Pain Location: L lateral thigh Pain Descriptors / Indicators: Grimacing, Guarding, Discomfort, Sore Pain Intervention(s): Limited activity within patient's tolerance, Monitored during session, Repositioned     PT Goals (current goals can now be found in the care plan section) Acute Rehab PT Goals PT Goal Formulation: With patient Time For Goal Achievement: 09/19/24 Potential to Achieve Goals: Fair Progress towards PT goals: Progressing toward goals    Frequency    Min 2X/week       AM-PAC PT 6 Clicks Mobility   Outcome Measure  Help needed turning from your back to your side while in a flat bed without using bedrails?: A Little Help needed moving from lying on your back to sitting on the side of a flat bed without using bedrails?: A Little Help needed moving to and from a bed to a chair (including a wheelchair)?: A Lot Help needed standing up from a chair using your arms (e.g., wheelchair or bedside chair)?: A Lot Help needed to walk in hospital room?: Total Help needed climbing 3-5 steps with a railing? : Total 6 Click Score: 12    End of Session Equipment Utilized During Treatment: Gait belt Activity Tolerance: Patient tolerated treatment well Patient left: in chair;with call bell/phone within reach Nurse Communication: Mobility status PT Visit Diagnosis: Unsteadiness on feet (R26.81);Other abnormalities of gait and mobility (R26.89);Muscle weakness (generalized) (M62.81);History of falling (Z91.81)     Time: 9071-8996 PT Time Calculation (min) (ACUTE ONLY): 35 min  Charges:    $Therapeutic Activity: 8-22 mins PT General Charges $$ ACUTE PT VISIT: 1 Visit                    Kate ORN, PT, DPT Secure Chat Preferred  Rehab Office [phone]   Kate BRAVO [name] 09/13/2024, 12:48 PM

## 2024-09-13 NOTE — Progress Notes (Signed)
 Mobility Specialist Progress Note:    09/13/24 1135  Mobility  Activity Ambulated with assistance  Level of Assistance Moderate assist, patient does 50-74% (+2 to stand from recliner. Ambulate w/ chair follow)  Assistive Device Four wheel walker  Distance Ambulated (ft) 15 ft  Range of Motion/Exercises Active  Activity Response Tolerated fair  Mobility Referral Yes  Mobility visit 1 Mobility  Mobility Specialist Start Time (ACUTE ONLY) 1135  Mobility Specialist Stop Time (ACUTE ONLY) 1200  Mobility Specialist Time Calculation (min) (ACUTE ONLY) 25 min   Received pt sitting in recliner w/ request from Dr. and RN to perform and O2 walk test. No c/o any symptoms. Pt able to stand w/ +2 and able to ambulate w/ one person plus chair follow. Pt able to move and ambulate decently. Pt fatigues very quickly but all VSS. Returned pt to recliner w/ all needs met. RN notified   Venetia Keel Mobility Specialist Please Contact via SecureChat or Rehab Office at [phone]

## 2024-09-13 NOTE — Assessment & Plan Note (Signed)
 Patient was placed on furosemide  with good toleration, at the time of her discharge her renal function has improved with a serum cr at 1,0 with K at 4,4 and serum bicarbonate at 41 Na 139   Plan to continue furosemide  and spironolactone  Follow up renal function and electrolytes as outpatient

## 2024-09-16 ENCOUNTER — Telehealth: Payer: Self-pay

## 2024-09-16 DIAGNOSIS — I5033 Acute on chronic diastolic (congestive) heart failure: Secondary | ICD-10-CM

## 2024-09-16 NOTE — Patient Instructions (Signed)
 Visit Information  Thank you for taking time to visit with me today. Please don't hesitate to contact me if I can be of assistance to you before our next scheduled telephone appointment.  Our next appointment is by telephone on 09/25/24 at 1000 am  Following is a copy of your care plan:   Goals Addressed             This Visit's Progress    VBCI Transitions of Care (TOC) Care Plan       Problems:  Recent Hospitalization for treatment of CHF Home Health services barrier: [facility] has not made contact. Patient has contact information to call, Knowledge Deficit Related to Heart Failure, and No Hospital Follow Up Provider appointment Patient will call PCP when they open on 1/27  Goal:  Over the next 30 days, the patient will not experience hospital readmission  Interventions:  Transitions of Care: Doctor Visits  - discussed the importance of doctor visits Communication with PCP re: TOC 30 day program enrollment  Heart Failure Interventions: Basic overview and discussion of pathophysiology of Heart Failure reviewed Provided education on low sodium diet Assessed need for readable accurate scales in home Provided education about placing scale on hard, flat surface Advised patient to weigh each morning after emptying bladder Discussed importance of daily weight and advised patient to weigh and record daily Reviewed role of diuretics in prevention of fluid overload and management of heart failure; Discussed the importance of keeping all appointments with provider Referral made to community resources care guide team for assistance with private transportation resources; Assessed social determinant of health barriers   Patient Self Care Activities:  Attend all scheduled provider appointments Call pharmacy for medication refills 3-7 days in advance of running out of medications Call provider office for new concerns or questions  Notify RN Care Manager of TOC call rescheduling  needs Participate in Transition of Care Program/Attend TOC scheduled calls Take medications as prescribed   call office if I gain more than 2 pounds in one day or 5 pounds in one week keep legs up while sitting track weight in diary use salt in moderation watch for swelling in feet, ankles and legs every day weigh myself daily  Plan:  The patient has been provided with contact information for the care management team and has been advised to call with any health related questions or concerns.         Patient verbalizes understanding of instructions and care plan provided today and agrees to view in MyChart. Active MyChart status and patient understanding of how to access instructions and care plan via MyChart confirmed with patient.     The patient has been provided with contact information for the care management team and has been advised to call with any health related questions or concerns.   Please call the care guide team at [phone] if you need to cancel or reschedule your appointment.   Please call the Suicide and Crisis Lifeline: 988 if you are experiencing a Mental Health or Behavioral Health Crisis or need someone to talk to.  Taelon Bendorf J. Tyrees Chopin RN, MSN [facility], [facility] Health RN Care Manager Direct Dial: [phone]  Fax: [phone] Website: [email or web address]

## 2024-09-16 NOTE — Transitions of Care (Post Inpatient/ED Visit) (Signed)
 "  09/16/2024  Name: [name] MRN: [id number] DOB: August 21, 1951  Today's TOC FU Call Status: Today's TOC FU Call Status:: Successful TOC FU Call Completed TOC FU Call Complete Date: 09/16/24  Patient's Name and Date of Birth confirmed. Name, DOB  Transition Care Management Follow-up Telephone Call Date of Discharge: 09/13/24 [facility]charge Facility: [facility]) Type of Discharge: Inpatient Admission Primary Inpatient Discharge Diagnosis:: Acute on chronic diastolic CHF How have you been since you were released from the hospital?: Better Any questions or concerns?: No  Items Reviewed: Did you receive and understand the discharge instructions provided?: Yes Medications obtained,verified, and reconciled?: Yes (Medications Reviewed) Any new allergies since your discharge?: No Dietary orders reviewed?: Yes Type of Diet Ordered:: Heart Healthy low salt Do you have support at home?: Yes People in Home [RPT]: child(ren), adult Name of Support/Comfort Primary Source: [place] [employer] [place]  Medications Reviewed Today: Medications Reviewed Today     Reviewed by Johnathan Heskett, RN (Case Manager) on 09/16/24 at 0940  Med List Status: <None>   Medication Order Taking? Sig Documenting Provider Last Dose Status Informant  acetaminophen  (TYLENOL ) 500 MG tablet [id number] Yes Take 500-1,000 mg by mouth daily as needed for mild pain (pain score 1-3), headache or moderate pain (pain score 4-6). [provider]  Active Child, Pharmacy Records  apixaban  (ELIQUIS ) 5 MG TABS tablet [id number] Yes TAKE 1 TABLET BY MOUTH 2 TIMES DAILY. Fernande Elspeth BROCKS, MD  Active Child, Pharmacy Records  clobetasol cream (TEMOVATE) 0.05 % [id number] Yes Apply topically as needed (rash). [provider]  Active Child, Pharmacy Records  escitalopram  (LEXAPRO ) 5 MG tablet [id number] Yes Take 5 mg by mouth daily. [provider]  Active Child, Pharmacy Records  flecainide  (TAMBOCOR ) 100 MG tablet  [id number] Yes Take 1 tablet (100 mg total) by mouth 2 (two) times daily. Riddle, Suzann, NP  Active Child, Pharmacy Records  furosemide  (LASIX ) 20 MG tablet [id number] Yes Take 2 tablets (40 mg total) by mouth 2 (two) times daily. Arrien, Elidia Sieving, MD  Active   levothyroxine  (SYNTHROID ) 88 MCG tablet [id number] Yes Take 88 mcg by mouth every morning. [provider]  Active Child, Pharmacy Records  losartan  (COZAAR ) 50 MG tablet [id number] Yes Take 50 mg by mouth daily. [provider]  Active Child, Pharmacy Records  metoprolol  tartrate (LOPRESSOR ) 50 MG tablet [id number] Yes Take 1 tablet (50 mg total) by mouth 2 (two) times daily. Riddle, Suzann, NP  Active Child, Pharmacy Records  omeprazole  [facility]) 20 MG capsule [id number] Yes Take 1 capsule (20 mg total) by mouth 2 (two) times daily before a meal.  Patient taking differently: Take 20 mg by mouth daily.   Geroldine Berg, MD  Active Child, Pharmacy Records  spironolactone  (ALDACTONE ) 25 MG tablet [id number] Yes Take 0.5 tablets (12.5 mg total) by mouth daily. Arrien, Elidia Sieving, MD  Active             Home Care and Equipment/Supplies: Were Home Health Services Ordered?: Yes (Agency has not called but patient has number and will call if no outreach by Wednesday.) Name of Home Health Agency:: [facility] Has Agency set up a time to come to your home?: No EMR reviewed for Home Health Orders: Orders present/patient has not received call (refer to CM for follow-up) Any new equipment or medical supplies ordered?: Yes Name of Medical supply agency?: [employer] Were you able to get the equipment/medical supplies?: No Do you have any questions related to the use of the equipment/supplies?:  No  Functional Questionnaire: Do you need assistance with bathing/showering or dressing?: Yes (patient taking wash ups presently after son bring water .) Do you need assistance with meal preparation?: Yes (son nd daughter in law assist with  meals.) Do you need assistance with eating?: No Do you have difficulty maintaining continence: No Do you need assistance with getting out of bed/getting out of a chair/moving?: No Do you have difficulty managing or taking your medications?: No  Follow up appointments reviewed: PCP Follow-up appointment confirmed?: No (patient to call PCP) MD Provider Line Number:[phone] Given: No Specialist Hospital Follow-up appointment confirmed?: No Reason Specialist Follow-Up Not Confirmed: Patient has Specialist Provider Number and will Call for Appointment  SDOH Interventions Today    Flowsheet Row Most Recent Value  SDOH Interventions   Food Insecurity Interventions Intervention Not Indicated  Housing Interventions Intervention Not Indicated  Transportation Interventions Intervention Not Indicated  Utilities Interventions Intervention Not Indicated   Discussed and offered 30 day TOC program.  Patient    agreeable.  The patient has been provided with contact information for the care management team and has been advised to call with any health -related questions or concerns.  The patient verbalized understanding with current plan of care.  The patient is directed to their insurance card regarding availability of benefits coverage.   Rayder Sullenger J. Ezelle Surprenant RN, MSN [facility], [facility] Health RN Care Manager Direct Dial: [phone]  Fax: [phone] Website: [email or web address]    "

## 2024-09-16 NOTE — Progress Notes (Signed)
 Complex Care Management Note Care Guide Note  09/16/2024 Name: [name] MRN: [id number] DOB: [birth year]  [name] is a 74 y.o. year old female who is a primary care patient of Clarice Nottingham, MD . The community resource team was consulted for assistance with Transportation Needs   SDOH screenings and interventions completed:  Yes  Social Drivers of Health From This Encounter   Food Insecurity: No Food Insecurity (09/16/2024)   Epic    Worried About Running Out of Food in the Last Year: Never true    Ran Out of Food in the Last Year: Never true  Housing: Low Risk (09/16/2024)   Epic    Unable to Pay for Housing in the Last Year: No    Number of Times Moved in the Last Year: 0    Homeless in the Last Year: No  Financial Resource Strain: Low Risk (09/16/2024)   Overall Financial Resource Strain (CARDIA)    Difficulty of Paying Living Expenses: Not very hard  Transportation Needs: No Transportation Needs (09/16/2024)   Epic    Lack of Transportation (Medical): No    Lack of Transportation (Non-Medical): No  Utilities: Not At Risk (09/16/2024)   Epic    Threatened with loss of utilities: No    SDOH Interventions Today    Flowsheet Row Most Recent Value  SDOH Interventions   Transportation Interventions Other (Comment)  [Completed online application for [employer] transportation. [employer] will call patient once they receive application. Gave patient [employer] number and verified home address to mail brochure.]     Care guide performed the following interventions: Patient provided with information about care guide support team and interviewed to confirm resource needs.  Follow Up Plan:  No further follow up planned at this time. The patient has been provided with needed resources.  Encounter Outcome:  Patient Visit Completed  [name] [name]  [facility] Guide Direct Dial: [phone]  Fax: [phone] Website: [email or web address]

## 2024-09-19 ENCOUNTER — Telehealth: Payer: Self-pay

## 2024-09-19 NOTE — Progress Notes (Signed)
 Complex Care Management Note Care Guide Note  09/19/2024 Name: [name] MRN: [id number] DOB: 1951-01-26  [name] is a 74 y.o. year old female who is a primary care patient of Clarice Nottingham, MD . The community resource team was consulted for assistance with Transportation Needs   SDOH screenings and interventions completed:  Yes     SDOH Interventions Today    Flowsheet Row Most Recent Value  SDOH Interventions   Transportation Interventions Other (Comment)  [Called patient to inform [name] the [employer] application has been approved. The fare will be paid by [employer] ([employer]).]     Care guide performed the following interventions: Follow up call placed to the patient to discuss status of referral.  Follow Up Plan:  Client will call [employer] to schedule transportation when needed. Patient was given the number to call [phone]. and No further follow up planned at this time. The patient has been provided with needed resources.  Encounter Outcome:  Patient Visit Completed  [name] Health  [facility] Guide Direct Dial: [phone]  Fax: [phone] Website: [email or web address]

## 2024-09-25 ENCOUNTER — Other Ambulatory Visit: Payer: Self-pay

## 2024-09-25 NOTE — Patient Instructions (Signed)
 Visit Information  Thank you for taking time to visit with me today. Please don't hesitate to contact me if I can be of assistance to you before our next scheduled telephone appointment.  Our next appointment is by telephone on 10/02/24 at 1000 am  Following is a copy of your care plan:   Goals Addressed             This Visit's Progress    VBCI Transitions of Care (TOC) Care Plan       Problems:  Recent Hospitalization for treatment of CHF Knowledge Deficit Related to Heart Failure and No Hospital Follow Up Provider appointment Patient appointment cancelled due to weather.  Patient to call to reschedule  Goal:  Over the next 30 days, the patient will not experience hospital readmission  Interventions:  Transitions of Care: Doctor Visits  - discussed the importance of doctor visits Reviewed home health-[facility] active with RN, OT, PT Discussed transportation. Patient states she has approval for [facility] that she states she will be calling once she sets her appointments with her PCP and Cardiology  Heart Failure Interventions: Provided education on low sodium diet Advised patient to weigh each morning after emptying bladder Discussed importance of daily weight and advised patient to weigh and record daily Reviewed role of diuretics in prevention of fluid overload and management of heart failure; Discussed the importance of keeping all appointments with provider  Patient Self Care Activities:  Attend all scheduled provider appointments Call pharmacy for medication refills 3-7 days in advance of running out of medications Call provider office for new concerns or questions  Notify RN Care Manager of TOC call rescheduling needs Participate in Transition of Care Program/Attend TOC scheduled calls Take medications as prescribed   call office if I gain more than 2 pounds in one day or 5 pounds in one week keep legs up while sitting track weight in diary use  salt in moderation watch for swelling in feet, ankles and legs every day weigh myself daily  Plan:  The patient has been provided with contact information for the care management team and has been advised to call with any health related questions or concerns.         Patient verbalizes understanding of instructions and care plan provided today and agrees to view in MyChart. Active MyChart status and patient understanding of how to access instructions and care plan via MyChart confirmed with patient.     The patient has been provided with contact information for the care management team and has been advised to call with any health related questions or concerns.   Please call the care guide team at [phone] if you need to cancel or reschedule your appointment.   Please call the Suicide and Crisis Lifeline: 988 if you are experiencing a Mental Health or Behavioral Health Crisis or need someone to talk to.  Demaurion Dicioccio J. Yvaine Jankowiak RN, MSN [facility], [facility] Health RN Care Manager Direct Dial: [phone]  Fax: [phone] Website: [email or web address]

## 2024-09-25 NOTE — Transitions of Care (Post Inpatient/ED Visit) (Signed)
 " Transition of Care week 2  Visit Note  09/25/2024  Name: [name] MRN: [id number]          DOB: Jan 02, 1951  Situation: Patient enrolled in [facility] 30-day program. Visit completed with patient by telephone.   Background:   Initial Transition Care Management Follow-up Telephone Call Discharge Date and Diagnosis: 09/13/24, Acute on chronic diastolic CHF   Past Medical History:  Diagnosis Date   Acute respiratory disease due to COVID-19 virus 09/08/2021   Anxiety disorder 09/08/2021   Arthritis    CHF (congestive heart failure) (HCC)    Chronic diastolic heart failure (HCC) 09/08/2021   Chronic kidney disease due to hypertension 09/08/2021   Class 3 obesity (HCC) 09/08/2021   Essential hypertension 10/15/2015   Gastroesophageal reflux disease 09/08/2021   Heart murmur    Hypertension    Hypothyroidism 10/15/2015   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   OA (osteoarthritis) of knee 04/04/2016   Paroxysmal atrial fibrillation (HCC) 09/08/2021   Tobacco user 09/08/2021   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 Patient denies swelling or shorntess of breath.  Discussed weights and importance. Also discussed low salt diet.  Respiratory Respiratory Symptoms Reported: No symptoms reported    Endocrine Endocrine Symptoms Reported: No symptoms reported    Gastrointestinal  Gastrointestinal Symptoms Reported: No symptoms reported      Genitourinary Genitourinary Symptoms Reported: No symptoms reported    Integumentary Integumentary Symptoms Reported: Bruising Additional Integumentary Details: Bruising to left thigh from fall Skin Management Strategies: Routine screening  Musculoskeletal Musculoskelatal Symptoms Reviewed: Weakness, Difficulty walking Additional Musculoskeletal Details: Uses rollator for ambulation.  [facility] active with PT and OT        Psychosocial Psychosocial Symptoms Reported: No symptoms reported         Today's Vitals   09/25/24 1011  Weight: (!) 364 lb (165.1 kg)      Medications Reviewed Today     Reviewed by Jahad Old, RN (Case Manager) on 09/25/24 at 1010  Med List Status: <None>   Medication Order Taking? Sig Documenting Provider Last Dose Status Informant  acetaminophen  (TYLENOL ) 500 MG tablet [id number] Yes Take 500-1,000 mg by mouth daily as needed for mild pain (pain score 1-3), headache or moderate pain (pain score 4-6). [provider]  Active Child, Pharmacy Records  apixaban  (ELIQUIS ) 5 MG TABS tablet [id number] Yes TAKE 1 TABLET BY MOUTH 2 TIMES DAILY. Fernande Elspeth BROCKS, MD  Active Child, Pharmacy Records  clobetasol cream (TEMOVATE) 0.05 % [id number] Yes Apply topically as needed (rash). [provider]  Active Child, Pharmacy Records  escitalopram  (LEXAPRO ) 5 MG tablet [id number] Yes Take 5 mg by mouth daily. [provider]  Active Child, Pharmacy Records  flecainide  (TAMBOCOR ) 100 MG tablet [id number] Yes Take 1 tablet (100 mg total) by mouth 2 (two) times daily. Riddle, Suzann, NP  Active Child, Pharmacy Records  furosemide  (LASIX ) 20 MG tablet [id number] Yes Take 2 tablets (40 mg total) by  mouth 2 (two) times daily. Arrien, Elidia Sieving, MD  Active   levothyroxine  (SYNTHROID ) 88 MCG tablet [id number] Yes Take 88 mcg by mouth every morning. [provider]  Active Child, Pharmacy  Records  losartan  (COZAAR ) 50 MG tablet [id number] Yes Take 50 mg by mouth daily. [provider]  Active Child, Pharmacy Records  metoprolol  tartrate (LOPRESSOR ) 50 MG tablet [id number] Yes Take 1 tablet (50 mg total) by mouth 2 (two) times daily. Riddle, Suzann, NP  Active Child, Pharmacy Records  omeprazole  [facility]) 20 MG capsule [id number] Yes Take 1 capsule (20 mg total) by mouth 2 (two) times daily before a meal.  Patient taking differently: Take 20 mg by mouth daily.   Geroldine Berg, MD  Active Child, Pharmacy Records  spironolactone  (ALDACTONE ) 25 MG tablet [id number] Yes Take 0.5 tablets (12.5 mg total) by mouth daily. Arrien, Elidia Sieving, MD  Active             Goals Addressed             This Visit's Progress    [facility] Transitions of Care (TOC) Care Plan       Problems:  Recent Hospitalization for treatment of CHF Knowledge Deficit Related to Heart Failure and No Hospital Follow Up Provider appointment Patient appointment cancelled due to weather.  Patient to call to reschedule  Goal:  Over the next 30 days, the patient will not experience hospital readmission  Interventions:  Transitions of Care: Doctor Visits  - discussed the importance of doctor visits Reviewed home health-[facility] active with RN, OT, PT Discussed transportation. Patient states she has approval for [facility] that she states she will be calling once she sets her appointments with her PCP and Cardiology  Heart Failure Interventions: Provided education on low sodium diet Advised patient to weigh each morning after emptying bladder Discussed importance of daily weight and advised patient to weigh and record daily Reviewed role of diuretics in prevention of fluid overload and management of heart failure; Discussed the importance of keeping all appointments with provider  Patient Self Care Activities:  Attend all scheduled provider appointments Call pharmacy for  medication refills 3-7 days in advance of running out of medications Call provider office for new concerns or questions  Notify RN Care Manager of TOC call rescheduling needs Participate in Transition of Care Program/Attend TOC scheduled calls Take medications as prescribed   call office if I gain more than 2 pounds in one day or 5 pounds in one week keep legs up while sitting track weight in diary use salt in moderation watch for swelling in feet, ankles and legs every day weigh myself daily  Plan:  The patient has been provided with contact information for the care management team and has been advised to call with any health related questions or concerns.         Recommendation:   Continue Current Plan of Care  Follow Up Plan:   Telephone follow-up in 1 week  Annalia Metzger J. Lachae Hohler RN, MSN [facility] RN Care Manager Direct Dial: [phone]  Fax: [phone] Website: [email or web address]      "

## 2024-10-01 ENCOUNTER — Telehealth

## 2024-10-02 ENCOUNTER — Telehealth
